# Patient Record
Sex: Female | Born: 1965 | Hispanic: No | Smoking: Current every day smoker
Health system: Southern US, Community
[De-identification: ages and names within clinical notes are randomized; demographics above are authoritative.]

## PROBLEM LIST (undated history)

## (undated) DIAGNOSIS — I639 Cerebral infarction, unspecified: Secondary | ICD-10-CM

## (undated) DIAGNOSIS — Z7901 Long term (current) use of anticoagulants: Secondary | ICD-10-CM

## (undated) DIAGNOSIS — M1990 Unspecified osteoarthritis, unspecified site: Secondary | ICD-10-CM

## (undated) DIAGNOSIS — I700 Atherosclerosis of aorta: Secondary | ICD-10-CM

## (undated) DIAGNOSIS — E785 Hyperlipidemia, unspecified: Secondary | ICD-10-CM

## (undated) DIAGNOSIS — J449 Chronic obstructive pulmonary disease, unspecified: Secondary | ICD-10-CM

## (undated) DIAGNOSIS — G4733 Obstructive sleep apnea (adult) (pediatric): Secondary | ICD-10-CM

## (undated) DIAGNOSIS — M51369 Other intervertebral disc degeneration, lumbar region without mention of lumbar back pain or lower extremity pain: Secondary | ICD-10-CM

## (undated) DIAGNOSIS — J45909 Unspecified asthma, uncomplicated: Secondary | ICD-10-CM

## (undated) DIAGNOSIS — E0590 Thyrotoxicosis, unspecified without thyrotoxic crisis or storm: Secondary | ICD-10-CM

## (undated) DIAGNOSIS — Z1379 Encounter for other screening for genetic and chromosomal anomalies: Secondary | ICD-10-CM

## (undated) DIAGNOSIS — M797 Fibromyalgia: Secondary | ICD-10-CM

## (undated) DIAGNOSIS — F4310 Post-traumatic stress disorder, unspecified: Secondary | ICD-10-CM

## (undated) DIAGNOSIS — M259 Joint disorder, unspecified: Secondary | ICD-10-CM

## (undated) DIAGNOSIS — Z923 Personal history of irradiation: Secondary | ICD-10-CM

## (undated) DIAGNOSIS — K219 Gastro-esophageal reflux disease without esophagitis: Secondary | ICD-10-CM

## (undated) DIAGNOSIS — F319 Bipolar disorder, unspecified: Secondary | ICD-10-CM

## (undated) DIAGNOSIS — F329 Major depressive disorder, single episode, unspecified: Secondary | ICD-10-CM

## (undated) DIAGNOSIS — G4700 Insomnia, unspecified: Secondary | ICD-10-CM

## (undated) DIAGNOSIS — N189 Chronic kidney disease, unspecified: Secondary | ICD-10-CM

## (undated) DIAGNOSIS — G2581 Restless legs syndrome: Secondary | ICD-10-CM

## (undated) DIAGNOSIS — N200 Calculus of kidney: Secondary | ICD-10-CM

## (undated) DIAGNOSIS — Z9221 Personal history of antineoplastic chemotherapy: Secondary | ICD-10-CM

## (undated) DIAGNOSIS — M5030 Other cervical disc degeneration, unspecified cervical region: Secondary | ICD-10-CM

## (undated) DIAGNOSIS — R0902 Hypoxemia: Secondary | ICD-10-CM

## (undated) DIAGNOSIS — F419 Anxiety disorder, unspecified: Secondary | ICD-10-CM

## (undated) DIAGNOSIS — Z8719 Personal history of other diseases of the digestive system: Secondary | ICD-10-CM

## (undated) DIAGNOSIS — G894 Chronic pain syndrome: Secondary | ICD-10-CM

## (undated) DIAGNOSIS — I10 Essential (primary) hypertension: Secondary | ICD-10-CM

## (undated) DIAGNOSIS — T7840XA Allergy, unspecified, initial encounter: Secondary | ICD-10-CM

## (undated) DIAGNOSIS — G43909 Migraine, unspecified, not intractable, without status migrainosus: Secondary | ICD-10-CM

## (undated) DIAGNOSIS — N183 Chronic kidney disease, stage 3 unspecified: Secondary | ICD-10-CM

## (undated) DIAGNOSIS — Z79891 Long term (current) use of opiate analgesic: Secondary | ICD-10-CM

## (undated) DIAGNOSIS — F32A Depression, unspecified: Secondary | ICD-10-CM

## (undated) DIAGNOSIS — I5030 Unspecified diastolic (congestive) heart failure: Secondary | ICD-10-CM

## (undated) DIAGNOSIS — J439 Emphysema, unspecified: Secondary | ICD-10-CM

## (undated) DIAGNOSIS — R87619 Unspecified abnormal cytological findings in specimens from cervix uteri: Secondary | ICD-10-CM

## (undated) DIAGNOSIS — M5136 Other intervertebral disc degeneration, lumbar region: Secondary | ICD-10-CM

## (undated) DIAGNOSIS — G4730 Sleep apnea, unspecified: Secondary | ICD-10-CM

## (undated) HISTORY — DX: Joint disorder, unspecified: M25.9

## (undated) HISTORY — PX: DILATION AND CURETTAGE OF UTERUS: SHX78

## (undated) HISTORY — PX: OOPHORECTOMY: SHX86

## (undated) HISTORY — DX: Fibromyalgia: M79.7

## (undated) HISTORY — DX: Depression, unspecified: F32.A

## (undated) HISTORY — DX: Unspecified osteoarthritis, unspecified site: M19.90

## (undated) HISTORY — DX: Gastro-esophageal reflux disease without esophagitis: K21.9

## (undated) HISTORY — DX: Unspecified abnormal cytological findings in specimens from cervix uteri: R87.619

## (undated) HISTORY — DX: Major depressive disorder, single episode, unspecified: F32.9

## (undated) HISTORY — DX: Allergy, unspecified, initial encounter: T78.40XA

## (undated) HISTORY — DX: Chronic obstructive pulmonary disease, unspecified: J44.9

## (undated) HISTORY — PX: NECK SURGERY: SHX720

## (undated) HISTORY — DX: Anxiety disorder, unspecified: F41.9

## (undated) HISTORY — PX: CARPAL TUNNEL RELEASE: SHX101

## (undated) HISTORY — DX: Migraine, unspecified, not intractable, without status migrainosus: G43.909

## (undated) HISTORY — PX: ENDOMETRIAL ABLATION: SHX621

## (undated) HISTORY — DX: Unspecified asthma, uncomplicated: J45.909

## (undated) HISTORY — PX: OTHER SURGICAL HISTORY: SHX169

## (undated) HISTORY — PX: LAPAROSCOPIC OOPHERECTOMY: SHX6507

## (undated) HISTORY — DX: Chronic kidney disease, unspecified: N18.9

## (undated) HISTORY — DX: Hypoxemia: R09.02

## (undated) HISTORY — DX: Encounter for other screening for genetic and chromosomal anomalies: Z13.79

---

## 2006-01-31 HISTORY — PX: ABDOMINAL HYSTERECTOMY: SHX81

## 2008-07-16 ENCOUNTER — Ambulatory Visit: Payer: Self-pay | Admitting: Internal Medicine

## 2008-08-12 ENCOUNTER — Ambulatory Visit: Payer: Self-pay | Admitting: Pain Medicine

## 2008-08-20 ENCOUNTER — Ambulatory Visit: Payer: Self-pay | Admitting: Pain Medicine

## 2008-09-01 ENCOUNTER — Ambulatory Visit: Payer: Self-pay | Admitting: Pain Medicine

## 2008-10-16 ENCOUNTER — Ambulatory Visit: Payer: Self-pay | Admitting: Pain Medicine

## 2008-10-29 ENCOUNTER — Ambulatory Visit: Payer: Self-pay | Admitting: Pain Medicine

## 2008-11-06 ENCOUNTER — Encounter: Payer: Self-pay | Admitting: Internal Medicine

## 2008-11-27 ENCOUNTER — Ambulatory Visit: Payer: Self-pay | Admitting: Pain Medicine

## 2008-12-01 ENCOUNTER — Encounter: Payer: Self-pay | Admitting: Internal Medicine

## 2008-12-03 ENCOUNTER — Ambulatory Visit: Payer: Self-pay | Admitting: Pain Medicine

## 2008-12-31 ENCOUNTER — Encounter: Payer: Self-pay | Admitting: Internal Medicine

## 2009-01-08 ENCOUNTER — Ambulatory Visit: Payer: Self-pay | Admitting: Pain Medicine

## 2009-03-18 ENCOUNTER — Ambulatory Visit: Payer: Self-pay | Admitting: Pain Medicine

## 2009-04-21 ENCOUNTER — Ambulatory Visit: Payer: Self-pay | Admitting: Rheumatology

## 2009-04-21 ENCOUNTER — Ambulatory Visit: Payer: Self-pay | Admitting: Pain Medicine

## 2009-05-04 ENCOUNTER — Ambulatory Visit: Payer: Self-pay | Admitting: Pain Medicine

## 2009-05-20 ENCOUNTER — Ambulatory Visit: Payer: Self-pay | Admitting: Pain Medicine

## 2009-06-10 ENCOUNTER — Ambulatory Visit: Payer: Self-pay | Admitting: Pain Medicine

## 2009-07-01 ENCOUNTER — Ambulatory Visit: Payer: Self-pay | Admitting: Pain Medicine

## 2009-08-13 ENCOUNTER — Ambulatory Visit: Payer: Self-pay | Admitting: Pain Medicine

## 2009-08-20 ENCOUNTER — Ambulatory Visit: Payer: Self-pay | Admitting: Gastroenterology

## 2009-08-24 ENCOUNTER — Ambulatory Visit: Payer: Self-pay | Admitting: Pain Medicine

## 2009-09-15 ENCOUNTER — Ambulatory Visit: Payer: Self-pay | Admitting: Pain Medicine

## 2009-09-29 ENCOUNTER — Ambulatory Visit: Payer: Self-pay | Admitting: Internal Medicine

## 2009-10-15 ENCOUNTER — Ambulatory Visit: Payer: Self-pay | Admitting: Pain Medicine

## 2009-10-28 ENCOUNTER — Ambulatory Visit: Payer: Self-pay | Admitting: Pain Medicine

## 2009-11-16 ENCOUNTER — Ambulatory Visit: Payer: Self-pay | Admitting: Pain Medicine

## 2009-11-23 ENCOUNTER — Ambulatory Visit: Payer: Self-pay | Admitting: Pain Medicine

## 2010-01-12 ENCOUNTER — Ambulatory Visit: Payer: Self-pay | Admitting: Pain Medicine

## 2010-01-27 ENCOUNTER — Ambulatory Visit: Payer: Self-pay | Admitting: Pain Medicine

## 2010-02-11 ENCOUNTER — Ambulatory Visit: Payer: Self-pay | Admitting: Pain Medicine

## 2010-03-09 ENCOUNTER — Ambulatory Visit: Payer: Self-pay | Admitting: Pain Medicine

## 2010-04-06 ENCOUNTER — Ambulatory Visit: Payer: Self-pay | Admitting: Pain Medicine

## 2010-04-28 ENCOUNTER — Ambulatory Visit: Payer: Self-pay | Admitting: Pain Medicine

## 2010-06-03 ENCOUNTER — Ambulatory Visit: Payer: Self-pay | Admitting: Pain Medicine

## 2010-06-16 ENCOUNTER — Ambulatory Visit: Payer: Self-pay | Admitting: Pain Medicine

## 2010-07-08 ENCOUNTER — Ambulatory Visit: Payer: Self-pay | Admitting: Pain Medicine

## 2010-08-02 ENCOUNTER — Ambulatory Visit: Payer: Self-pay | Admitting: Pain Medicine

## 2010-08-06 ENCOUNTER — Emergency Department: Payer: Self-pay | Admitting: Unknown Physician Specialty

## 2010-08-16 ENCOUNTER — Ambulatory Visit: Payer: Self-pay | Admitting: Pain Medicine

## 2010-09-08 ENCOUNTER — Ambulatory Visit: Payer: Self-pay | Admitting: Pain Medicine

## 2010-09-15 ENCOUNTER — Ambulatory Visit: Payer: Self-pay | Admitting: Pain Medicine

## 2010-10-05 ENCOUNTER — Ambulatory Visit: Payer: Self-pay | Admitting: Pain Medicine

## 2010-10-13 ENCOUNTER — Ambulatory Visit: Payer: Self-pay | Admitting: Pain Medicine

## 2010-10-14 ENCOUNTER — Ambulatory Visit: Payer: Self-pay | Admitting: Internal Medicine

## 2010-11-04 ENCOUNTER — Ambulatory Visit: Payer: Self-pay | Admitting: Pain Medicine

## 2010-11-08 ENCOUNTER — Ambulatory Visit: Payer: Self-pay | Admitting: Pain Medicine

## 2010-11-30 ENCOUNTER — Ambulatory Visit: Payer: Self-pay | Admitting: Pain Medicine

## 2010-12-06 ENCOUNTER — Ambulatory Visit: Payer: Self-pay | Admitting: Pain Medicine

## 2010-12-10 ENCOUNTER — Ambulatory Visit: Payer: Self-pay | Admitting: Pain Medicine

## 2010-12-28 ENCOUNTER — Ambulatory Visit: Payer: Self-pay | Admitting: Pain Medicine

## 2011-01-18 ENCOUNTER — Ambulatory Visit: Payer: Self-pay | Admitting: Pain Medicine

## 2011-02-23 ENCOUNTER — Ambulatory Visit: Payer: Self-pay | Admitting: Pain Medicine

## 2011-03-01 ENCOUNTER — Ambulatory Visit: Payer: Self-pay | Admitting: Pain Medicine

## 2011-03-21 ENCOUNTER — Ambulatory Visit: Payer: Self-pay | Admitting: Internal Medicine

## 2011-03-23 ENCOUNTER — Ambulatory Visit: Payer: Self-pay | Admitting: Pain Medicine

## 2011-03-25 ENCOUNTER — Ambulatory Visit: Payer: Self-pay | Admitting: Internal Medicine

## 2011-03-25 LAB — CREATININE, SERUM
Creatinine: 1.11 mg/dL (ref 0.60–1.30)
EGFR (African American): >60
EGFR (Non-African Amer.): 56 — ABNORMAL LOW

## 2011-03-30 ENCOUNTER — Ambulatory Visit: Payer: Self-pay | Admitting: Rheumatology

## 2011-05-03 ENCOUNTER — Ambulatory Visit: Payer: Self-pay | Admitting: Pain Medicine

## 2011-05-16 ENCOUNTER — Ambulatory Visit: Payer: Self-pay | Admitting: Pain Medicine

## 2011-06-02 ENCOUNTER — Ambulatory Visit: Payer: Self-pay | Admitting: Pain Medicine

## 2011-06-15 ENCOUNTER — Ambulatory Visit: Payer: Self-pay | Admitting: Pain Medicine

## 2011-06-30 ENCOUNTER — Ambulatory Visit: Payer: Self-pay | Admitting: Pain Medicine

## 2011-07-25 ENCOUNTER — Ambulatory Visit: Payer: Self-pay | Admitting: Pain Medicine

## 2011-08-15 ENCOUNTER — Ambulatory Visit: Payer: Self-pay | Admitting: Pain Medicine

## 2011-08-30 ENCOUNTER — Ambulatory Visit: Payer: Self-pay | Admitting: Pain Medicine

## 2011-09-27 ENCOUNTER — Ambulatory Visit: Payer: Self-pay | Admitting: Pain Medicine

## 2011-10-10 ENCOUNTER — Ambulatory Visit: Payer: Self-pay | Admitting: Pain Medicine

## 2011-10-17 ENCOUNTER — Ambulatory Visit: Payer: Self-pay | Admitting: Internal Medicine

## 2011-11-22 ENCOUNTER — Ambulatory Visit: Payer: Self-pay | Admitting: Pain Medicine

## 2011-12-14 ENCOUNTER — Ambulatory Visit: Payer: Self-pay | Admitting: Pain Medicine

## 2012-01-19 ENCOUNTER — Ambulatory Visit: Payer: Self-pay | Admitting: Pain Medicine

## 2012-02-15 ENCOUNTER — Ambulatory Visit: Payer: Self-pay | Admitting: Pain Medicine

## 2012-03-14 ENCOUNTER — Ambulatory Visit: Payer: Self-pay | Admitting: Unknown Physician Specialty

## 2012-03-14 LAB — CREATININE, SERUM
Creatinine: 1.01 mg/dL (ref 0.60–1.30)
EGFR (Non-African Amer.): >60

## 2012-03-22 ENCOUNTER — Ambulatory Visit: Payer: Self-pay | Admitting: Pain Medicine

## 2012-04-02 ENCOUNTER — Ambulatory Visit: Payer: Self-pay | Admitting: Pain Medicine

## 2012-04-09 ENCOUNTER — Ambulatory Visit: Payer: Self-pay | Admitting: Pain Medicine

## 2012-04-19 ENCOUNTER — Ambulatory Visit: Payer: Self-pay | Admitting: Pain Medicine

## 2012-05-22 ENCOUNTER — Ambulatory Visit: Payer: Self-pay | Admitting: Pain Medicine

## 2012-05-31 ENCOUNTER — Ambulatory Visit: Payer: Self-pay | Admitting: Pain Medicine

## 2012-06-04 ENCOUNTER — Ambulatory Visit: Payer: Self-pay | Admitting: Pain Medicine

## 2012-06-21 ENCOUNTER — Ambulatory Visit: Payer: Self-pay | Admitting: Pain Medicine

## 2012-07-04 ENCOUNTER — Ambulatory Visit: Payer: Self-pay | Admitting: Pain Medicine

## 2012-07-19 ENCOUNTER — Ambulatory Visit: Payer: Self-pay | Admitting: Pain Medicine

## 2012-08-12 ENCOUNTER — Emergency Department: Payer: Self-pay | Admitting: Emergency Medicine

## 2012-08-21 ENCOUNTER — Ambulatory Visit: Payer: Self-pay | Admitting: Pain Medicine

## 2012-09-05 ENCOUNTER — Ambulatory Visit: Payer: Self-pay | Admitting: Pain Medicine

## 2012-09-10 ENCOUNTER — Encounter: Payer: Self-pay | Admitting: Orthopedic Surgery

## 2012-09-18 ENCOUNTER — Ambulatory Visit: Payer: Self-pay | Admitting: Pain Medicine

## 2012-10-01 ENCOUNTER — Encounter: Payer: Self-pay | Admitting: Orthopedic Surgery

## 2012-10-03 ENCOUNTER — Ambulatory Visit: Payer: Self-pay | Admitting: Pain Medicine

## 2012-10-16 ENCOUNTER — Ambulatory Visit: Payer: Self-pay | Admitting: Pain Medicine

## 2012-10-17 ENCOUNTER — Ambulatory Visit: Payer: Self-pay | Admitting: Internal Medicine

## 2012-10-31 ENCOUNTER — Encounter: Payer: Self-pay | Admitting: Orthopedic Surgery

## 2012-11-05 ENCOUNTER — Ambulatory Visit: Payer: Self-pay | Admitting: Gastroenterology

## 2012-11-09 ENCOUNTER — Ambulatory Visit: Payer: Self-pay | Admitting: Internal Medicine

## 2012-11-15 ENCOUNTER — Ambulatory Visit: Payer: Self-pay | Admitting: Pain Medicine

## 2012-11-28 ENCOUNTER — Ambulatory Visit: Payer: Self-pay | Admitting: Pain Medicine

## 2012-12-01 ENCOUNTER — Encounter: Payer: Self-pay | Admitting: Orthopedic Surgery

## 2012-12-18 ENCOUNTER — Ambulatory Visit: Payer: Self-pay | Admitting: Pain Medicine

## 2013-01-02 ENCOUNTER — Ambulatory Visit: Payer: Self-pay | Admitting: Pain Medicine

## 2013-01-15 ENCOUNTER — Ambulatory Visit: Payer: Self-pay | Admitting: Pain Medicine

## 2013-02-14 ENCOUNTER — Ambulatory Visit: Payer: Self-pay | Admitting: Pain Medicine

## 2013-02-26 ENCOUNTER — Ambulatory Visit: Payer: Self-pay | Admitting: Pain Medicine

## 2013-02-27 ENCOUNTER — Ambulatory Visit: Payer: Self-pay | Admitting: Pain Medicine

## 2013-03-21 ENCOUNTER — Ambulatory Visit: Payer: Self-pay | Admitting: Pain Medicine

## 2013-04-18 ENCOUNTER — Ambulatory Visit: Payer: Self-pay | Admitting: Pain Medicine

## 2013-04-19 DIAGNOSIS — M47812 Spondylosis without myelopathy or radiculopathy, cervical region: Secondary | ICD-10-CM | POA: Insufficient documentation

## 2013-04-29 ENCOUNTER — Ambulatory Visit: Payer: Self-pay | Admitting: Pain Medicine

## 2013-05-16 ENCOUNTER — Ambulatory Visit: Payer: Self-pay | Admitting: Pain Medicine

## 2013-06-05 ENCOUNTER — Ambulatory Visit: Payer: Self-pay | Admitting: Pain Medicine

## 2013-06-13 ENCOUNTER — Ambulatory Visit: Payer: Self-pay | Admitting: Pain Medicine

## 2013-07-16 ENCOUNTER — Ambulatory Visit: Payer: Self-pay | Admitting: Pain Medicine

## 2013-07-24 ENCOUNTER — Ambulatory Visit: Payer: Self-pay | Admitting: Pain Medicine

## 2013-08-01 ENCOUNTER — Emergency Department: Payer: Self-pay | Admitting: Emergency Medicine

## 2013-08-22 ENCOUNTER — Ambulatory Visit: Payer: Self-pay | Admitting: Pain Medicine

## 2013-09-24 ENCOUNTER — Ambulatory Visit: Payer: Self-pay | Admitting: Pain Medicine

## 2013-09-24 DIAGNOSIS — M5010 Cervical disc disorder with radiculopathy, unspecified cervical region: Secondary | ICD-10-CM | POA: Insufficient documentation

## 2013-10-09 ENCOUNTER — Ambulatory Visit: Payer: Self-pay | Admitting: Pain Medicine

## 2013-10-22 ENCOUNTER — Ambulatory Visit: Payer: Self-pay | Admitting: Pain Medicine

## 2013-11-12 ENCOUNTER — Ambulatory Visit: Payer: Self-pay | Admitting: Pain Medicine

## 2013-11-12 ENCOUNTER — Ambulatory Visit: Payer: Self-pay | Admitting: Nurse Practitioner

## 2013-11-28 DIAGNOSIS — G894 Chronic pain syndrome: Secondary | ICD-10-CM | POA: Insufficient documentation

## 2013-12-10 ENCOUNTER — Ambulatory Visit: Payer: Self-pay | Admitting: Pain Medicine

## 2014-01-09 ENCOUNTER — Ambulatory Visit: Payer: Self-pay | Admitting: Pain Medicine

## 2014-02-05 ENCOUNTER — Ambulatory Visit: Payer: Self-pay | Admitting: Podiatry

## 2014-02-06 ENCOUNTER — Ambulatory Visit: Payer: Self-pay | Admitting: Pain Medicine

## 2014-02-17 ENCOUNTER — Ambulatory Visit (INDEPENDENT_AMBULATORY_CARE_PROVIDER_SITE_OTHER): Payer: Medicare Other | Admitting: Podiatry

## 2014-02-17 ENCOUNTER — Ambulatory Visit (INDEPENDENT_AMBULATORY_CARE_PROVIDER_SITE_OTHER): Payer: Medicare Other

## 2014-02-17 ENCOUNTER — Encounter: Payer: Self-pay | Admitting: Podiatry

## 2014-02-17 VITALS — BP 119/76 | HR 81 | Resp 16 | Ht 61.0 in | Wt 165.0 lb

## 2014-02-17 DIAGNOSIS — M797 Fibromyalgia: Secondary | ICD-10-CM | POA: Insufficient documentation

## 2014-02-17 DIAGNOSIS — G2581 Restless legs syndrome: Secondary | ICD-10-CM | POA: Insufficient documentation

## 2014-02-17 DIAGNOSIS — J449 Chronic obstructive pulmonary disease, unspecified: Secondary | ICD-10-CM | POA: Insufficient documentation

## 2014-02-17 DIAGNOSIS — R519 Headache, unspecified: Secondary | ICD-10-CM | POA: Insufficient documentation

## 2014-02-17 DIAGNOSIS — E785 Hyperlipidemia, unspecified: Secondary | ICD-10-CM | POA: Insufficient documentation

## 2014-02-17 DIAGNOSIS — N289 Disorder of kidney and ureter, unspecified: Secondary | ICD-10-CM | POA: Insufficient documentation

## 2014-02-17 DIAGNOSIS — G4730 Sleep apnea, unspecified: Secondary | ICD-10-CM | POA: Insufficient documentation

## 2014-02-17 DIAGNOSIS — M779 Enthesopathy, unspecified: Secondary | ICD-10-CM

## 2014-02-17 DIAGNOSIS — F319 Bipolar disorder, unspecified: Secondary | ICD-10-CM | POA: Insufficient documentation

## 2014-02-17 DIAGNOSIS — R51 Headache: Secondary | ICD-10-CM

## 2014-02-17 NOTE — Progress Notes (Signed)
   Subjective:    Patient ID: Drucilla Schmidt, female    DOB: 06/18/1965, 49 y.o.   MRN: [id number]  HPI Comments: Pain in the balls of my feet , i cant wear high heels nomore, also notice black stuff on my nails   Foot Pain      Review of Systems  All other systems reviewed and are negative.      Objective:   Physical Exam: I have reviewed her past medical history medications allergies surgery social history and review of systems. Pulses are strongly palpable bilateral. Neurologic sensorium is intact per Semmes-Weinstein monofilament. Deep tendon reflexes are intact bilateral and muscle strength was 5 over 5 dorsiflexion plantar flexors and inverters and everters all into the musculature is intact. Orthopedic evaluation demonstrates all joints distal to the ankle, full range of motion without crepitation. She has pain on end range of motion of the second metatarsophalangeal joints bilaterally. Radiographic evaluation does not demonstrate any type of osseus abnormalities in this area. Cutaneous evaluation of Mr. supple well-hydrated cutis no erythema edema cellulitis drainage or odor.        Assessment & Plan:  Assessment: Capsulitis second metatarsophalangeal joint bilateral.  Plan: Injected the second metatarsophalangeal joint today intra-articularly with dexamethasone and local anesthetic after Betadine skin prep. I will follow-up with her in a couple weeks discussed appropriate shoe gear strict exercises and ice therapy.

## 2014-02-27 DIAGNOSIS — G43119 Migraine with aura, intractable, without status migrainosus: Secondary | ICD-10-CM | POA: Insufficient documentation

## 2014-03-11 ENCOUNTER — Ambulatory Visit: Payer: Self-pay | Admitting: Pain Medicine

## 2014-03-19 ENCOUNTER — Ambulatory Visit (INDEPENDENT_AMBULATORY_CARE_PROVIDER_SITE_OTHER): Payer: Medicare Other | Admitting: Podiatry

## 2014-03-19 ENCOUNTER — Ambulatory Visit: Payer: Medicare Other | Admitting: Podiatry

## 2014-03-19 ENCOUNTER — Encounter: Payer: Self-pay | Admitting: Podiatry

## 2014-03-19 VITALS — BP 142/86 | HR 86 | Resp 12

## 2014-03-19 DIAGNOSIS — L608 Other nail disorders: Secondary | ICD-10-CM | POA: Diagnosis not present

## 2014-03-19 DIAGNOSIS — M779 Enthesopathy, unspecified: Secondary | ICD-10-CM

## 2014-03-19 DIAGNOSIS — L603 Nail dystrophy: Secondary | ICD-10-CM

## 2014-03-19 NOTE — Progress Notes (Signed)
She presents today for follow-up of capsulitis to the second metatarsophalangeal joints bilateral. She states that they are at least 50% improved.  Objective: Vital signs are stable she is alert and oriented 3 pulses are palpable bilateral. She has mild to moderate tenderness on in range of motion of the second metatarsophalangeal joints bilateral.  Assessment: Capsulitis second metatarsophalangeal joint bilateral.  Plan: I injected dexamethasone intra-articularly once again today after sterile Betadine skin prep to the second metatarsophalangeal joints bilateral. She tolerated the procedure well and I will follow-up with her in approximately 1 month.

## 2014-03-31 ENCOUNTER — Ambulatory Visit: Payer: Self-pay | Admitting: Pain Medicine

## 2014-04-02 ENCOUNTER — Telehealth: Payer: Self-pay | Admitting: *Deleted

## 2014-04-02 NOTE — Telephone Encounter (Signed)
Spoke to patient told her toenail culture came back negative for fungus

## 2014-04-04 ENCOUNTER — Ambulatory Visit: Payer: Self-pay | Admitting: Orthopedic Surgery

## 2014-04-08 ENCOUNTER — Encounter: Payer: Self-pay | Admitting: Podiatry

## 2014-04-29 ENCOUNTER — Ambulatory Visit: Admit: 2014-04-29 | Disposition: A | Discharge status: Home / Self Care | Payer: Self-pay | Admitting: Pain Medicine

## 2014-05-22 ENCOUNTER — Ambulatory Visit
Admit: 2014-05-22 | Disposition: A | Discharge status: Home / Self Care | Payer: Self-pay | Attending: Pain Medicine | Admitting: Pain Medicine

## 2014-06-02 ENCOUNTER — Encounter: Payer: Self-pay | Admitting: Pain Medicine

## 2014-06-24 ENCOUNTER — Encounter: Payer: Self-pay | Admitting: Pain Medicine

## 2014-07-21 ENCOUNTER — Ambulatory Visit: Payer: Medicare Other | Attending: Pain Medicine | Admitting: Pain Medicine

## 2014-07-21 ENCOUNTER — Encounter: Payer: Self-pay | Admitting: Pain Medicine

## 2014-07-21 VITALS — BP 104/73 | HR 80 | Temp 98.0°F | Resp 14 | Ht 61.0 in | Wt 160.0 lb

## 2014-07-21 DIAGNOSIS — M19019 Primary osteoarthritis, unspecified shoulder: Secondary | ICD-10-CM | POA: Diagnosis not present

## 2014-07-21 DIAGNOSIS — M2578 Osteophyte, vertebrae: Secondary | ICD-10-CM | POA: Diagnosis not present

## 2014-07-21 DIAGNOSIS — M47816 Spondylosis without myelopathy or radiculopathy, lumbar region: Secondary | ICD-10-CM

## 2014-07-21 DIAGNOSIS — M5136 Other intervertebral disc degeneration, lumbar region: Secondary | ICD-10-CM | POA: Insufficient documentation

## 2014-07-21 DIAGNOSIS — M542 Cervicalgia: Secondary | ICD-10-CM | POA: Diagnosis present

## 2014-07-21 DIAGNOSIS — M5022 Other cervical disc displacement, mid-cervical region: Secondary | ICD-10-CM | POA: Diagnosis not present

## 2014-07-21 DIAGNOSIS — M545 Low back pain: Secondary | ICD-10-CM | POA: Diagnosis present

## 2014-07-21 DIAGNOSIS — M5134 Other intervertebral disc degeneration, thoracic region: Secondary | ICD-10-CM

## 2014-07-21 DIAGNOSIS — Z981 Arthrodesis status: Secondary | ICD-10-CM

## 2014-07-21 DIAGNOSIS — M170 Bilateral primary osteoarthritis of knee: Secondary | ICD-10-CM | POA: Diagnosis not present

## 2014-07-21 DIAGNOSIS — M47814 Spondylosis without myelopathy or radiculopathy, thoracic region: Secondary | ICD-10-CM | POA: Diagnosis not present

## 2014-07-21 DIAGNOSIS — M4804 Spinal stenosis, thoracic region: Secondary | ICD-10-CM | POA: Diagnosis not present

## 2014-07-21 DIAGNOSIS — M5126 Other intervertebral disc displacement, lumbar region: Secondary | ICD-10-CM | POA: Diagnosis not present

## 2014-07-21 DIAGNOSIS — M47812 Spondylosis without myelopathy or radiculopathy, cervical region: Secondary | ICD-10-CM | POA: Diagnosis not present

## 2014-07-21 DIAGNOSIS — M5030 Other cervical disc degeneration, unspecified cervical region: Secondary | ICD-10-CM | POA: Diagnosis not present

## 2014-07-21 DIAGNOSIS — M19012 Primary osteoarthritis, left shoulder: Secondary | ICD-10-CM

## 2014-07-21 DIAGNOSIS — M5481 Occipital neuralgia: Secondary | ICD-10-CM

## 2014-07-21 DIAGNOSIS — M533 Sacrococcygeal disorders, not elsewhere classified: Secondary | ICD-10-CM | POA: Diagnosis not present

## 2014-07-21 DIAGNOSIS — M51369 Other intervertebral disc degeneration, lumbar region without mention of lumbar back pain or lower extremity pain: Secondary | ICD-10-CM

## 2014-07-21 MED ORDER — LIDOCAINE 5 % EX PTCH: MEDICATED_PATCH | CUTANEOUS | Status: DC

## 2014-07-21 MED ORDER — TRAMADOL HCL 50 MG PO TABS: ORAL_TABLET | ORAL | Status: DC

## 2014-07-21 MED ORDER — CHLORZOXAZONE 500 MG PO TABS: ORAL_TABLET | ORAL | Status: DC

## 2014-07-21 MED ORDER — HYDROCODONE-ACETAMINOPHEN 7.5-325 MG PO TABS: ORAL_TABLET | ORAL | Status: DC

## 2014-07-21 NOTE — Progress Notes (Signed)
Safety precautions to be maintained throughout the outpatient stay will include: orient to surroundings, keep bed in low position, maintain call bell within reach at all times, provide assistance with transfer out of bed and ambulation. Discharge ambulatory at 9:35 am

## 2014-07-21 NOTE — Patient Instructions (Addendum)
Continue present medications.  F/U PCP for evaliation of  BP and general medical  condition.  F/U surgical evaluation.  F/U neurological evaluation.  May consider radiofrequency rhizolysis or intraspinal procedures pending response to present treatment and F/U evaluation.  [facility] to [facility] should patient have concerns prior to scheduled return appointment.   A prescription for HYDROCODONE and ULTRAM was given to you today. A prescription for LIDODERM and PARAFON was sent to your pharmacy and should be available for pickup today.

## 2014-07-21 NOTE — Progress Notes (Signed)
   Subjective:    Patient ID: Briana [name], female    DOB: Jun 19, 1965, 49 y.o.   MRN: [id number]  HPI  Patient is 49 year old female who returns to [place] for follow-up evaluation and treatment of pain involving the neck upper extremity regions mediated and lower back upper and lower extremity regions. Patient is quite saddened on today's visit patient recently lost her fianc when they were vacationing. Discussed patient's condition will continue present medications and will consider for modification of medications pending follow-up evaluation. We'll also consider interventional treatment pending follow-up evaluation of patient. The patient was understanding and agree with suggested treatment plan    Review of Systems     Objective:   Physical Exam  There was mild tenderness of the splenius capitis and occipitalis musculature region and there was unremarkable Spurling's maneuver. Patient was with bilaterally equal grip strength. There was limited range of motion of the shoulders. Tinel and Phalen's maneuver were without increase of pain of significant degree. There was tends to palpation over the thoracic facet thoracic paraspinal musculature region without crepitus of the thoracic region noted. Palpation over the lumbar paraspinal musculature region lumbar facet region was a tends to palpation with tenderness over the PSIS and PII S region of mild to moderate degree. Was tenderness over the lumbar facet lumbar paraspinal musculature region of moderate degree. Palpation of the greater trochanteric territory region and iliotibial band regions were with moderate tends to palpation. There was negative clonus negative Homans. Straight leg raising was tolerates approximately 30 without increased with of pain with dorsiflexion noted. Knees were with tenderness to palpation with crepitus of the knees with negative anterior and posterior drawer signs without ballottement of the  patella. He was nontender and no costovertebral angle tenderness was noted.          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 Hydrocodone acetaminophen, tramadol, Parafon forte  F/U PCP for evaliation of  BP and general medical  Condition.  Follow up evaluation with Dr. Kasandra Knudsen as planned and as discussed  F/U surgical evaluation.  F/U neurological evaluation.  May consider radiofrequency rhizolysis or intraspinal procedures pending response to present treatment and F/U evaluation.  [facility] to call [facility] should patient have concerns prior to scheduled return appointment.

## 2014-08-19 ENCOUNTER — Encounter: Payer: Self-pay | Admitting: Pain Medicine

## 2014-08-19 ENCOUNTER — Ambulatory Visit: Payer: Medicare Other | Attending: Pain Medicine | Admitting: Pain Medicine

## 2014-08-19 VITALS — BP 106/93 | HR 99 | Temp 97.1°F | Resp 18 | Ht 61.0 in | Wt 175.0 lb

## 2014-08-19 DIAGNOSIS — Z981 Arthrodesis status: Secondary | ICD-10-CM

## 2014-08-19 DIAGNOSIS — M170 Bilateral primary osteoarthritis of knee: Secondary | ICD-10-CM | POA: Insufficient documentation

## 2014-08-19 DIAGNOSIS — M533 Sacrococcygeal disorders, not elsewhere classified: Secondary | ICD-10-CM | POA: Diagnosis not present

## 2014-08-19 DIAGNOSIS — M47816 Spondylosis without myelopathy or radiculopathy, lumbar region: Secondary | ICD-10-CM | POA: Insufficient documentation

## 2014-08-19 DIAGNOSIS — M19012 Primary osteoarthritis, left shoulder: Secondary | ICD-10-CM

## 2014-08-19 DIAGNOSIS — M5030 Other cervical disc degeneration, unspecified cervical region: Secondary | ICD-10-CM

## 2014-08-19 DIAGNOSIS — M79601 Pain in right arm: Secondary | ICD-10-CM | POA: Diagnosis present

## 2014-08-19 DIAGNOSIS — M5126 Other intervertebral disc displacement, lumbar region: Secondary | ICD-10-CM | POA: Diagnosis not present

## 2014-08-19 DIAGNOSIS — M5136 Other intervertebral disc degeneration, lumbar region: Secondary | ICD-10-CM | POA: Diagnosis not present

## 2014-08-19 DIAGNOSIS — M19019 Primary osteoarthritis, unspecified shoulder: Secondary | ICD-10-CM | POA: Insufficient documentation

## 2014-08-19 DIAGNOSIS — G588 Other specified mononeuropathies: Secondary | ICD-10-CM | POA: Diagnosis not present

## 2014-08-19 DIAGNOSIS — M19011 Primary osteoarthritis, right shoulder: Secondary | ICD-10-CM

## 2014-08-19 DIAGNOSIS — M542 Cervicalgia: Secondary | ICD-10-CM | POA: Diagnosis present

## 2014-08-19 DIAGNOSIS — M5134 Other intervertebral disc degeneration, thoracic region: Secondary | ICD-10-CM | POA: Insufficient documentation

## 2014-08-19 DIAGNOSIS — M5481 Occipital neuralgia: Secondary | ICD-10-CM

## 2014-08-19 DIAGNOSIS — M79602 Pain in left arm: Secondary | ICD-10-CM | POA: Diagnosis present

## 2014-08-19 DIAGNOSIS — G2581 Restless legs syndrome: Secondary | ICD-10-CM

## 2014-08-19 MED ORDER — TRAMADOL HCL 50 MG PO TABS: ORAL_TABLET | ORAL | Status: DC

## 2014-08-19 MED ORDER — CHLORZOXAZONE 500 MG PO TABS: ORAL_TABLET | ORAL | Status: DC

## 2014-08-19 MED ORDER — HYDROCODONE-ACETAMINOPHEN 7.5-325 MG PO TABS: ORAL_TABLET | ORAL | Status: DC

## 2014-08-19 NOTE — Patient Instructions (Addendum)
Continue present medications  Parafon Forte Ultram and hydrocodone acetaminophen and Lidoderm patches  F/U PCP Dr  Stephanie Coup for evaliation of  BP and general medical  condition.  F/U surgical evaluation of pain of the cervical and lumbar regions and follow-up with Dr. Mack Guise for evaluation of shoulder  F/U neurological evaluation  May consider radiofrequency rhizolysis or intraspinal procedures pending response to present treatment and F/U evaluation.  Patient to call [facility] should patient have concerns prior to scheduled return appointment.

## 2014-08-19 NOTE — Progress Notes (Signed)
Safety precautions to be maintained throughout the outpatient stay will include: orient to surroundings, keep bed in low position, maintain call bell within reach at all times, provide assistance with transfer out of bed and ambulation.  

## 2014-08-19 NOTE — Progress Notes (Signed)
   Subjective:    Patient ID: Briana [name], female    DOB: [birth year], 49 y.o.   MRN: [id number]  HPI   The patient is 49 year old female [facility] evaluation and treatment of pain involving neck upper extremity region. Lower back and lower extremity region. Patient with significant pain involving the right shoulder to scheduled to undergo surgical intervention of the right shoulder with pain interfering with all activities of daily living as well as ability to obtain restful sleep. Patient denies any trauma change in events of daily living the call significant change in symptomatology.         PHYSICAL EXAMINATION   There was tends to palpation of the region of the splenius capitis and occipitalis region palpation with reproduced pain of mild to moderate degree. There was tends to palpation over the thoracic facet thoracic paraspinal musculature region. No crepitus of the thoracic region was noted. There appeared to be unremarkable Spurling's maneuver. Of the acromial clavicular glenohumeral joint region especially on the right and patient had difficulty performing the drop test. Palpation over the region of the lumbar paraspinal muscles region lumbar facet region associated with tenderness to palpation of moderately severe degree. There was straight leg raising limited to approximately 20 without increased pain with dorsiflexion noted. There was negative clonus negative Homans. No definite sensory deficit of dermatomal distribution detected. Increased pain with pressure applied to the ilium with patient in lateral decubitus position. No definite sensory deficit of dermatomal distribution was detected. There was negative clonus negative Homans. Knees with tenderness to palpation with negative anterior and posterior drawer signs. There was no ballottement of the patella noted there was crepitus of the knee. There were negative anterior and posterior drawer  signs.    Review of Systems     Objective:   Physical Exam         Assessment & Plan:     Degenerative disc disease lumbar spine  Degenerative changes lumbar spine T12-L1, L1 to, L2-3, L3-4, L4-5, and L5-S1 degenerative changes with paracentral disc bulging, partially facet effacement of the anterior cerebrospinal fluid space   Lumbar facet syndrome   Sacroiliac joint dysfunction   Thoracic degenerative disc disease   Thoracic facet syndrome   Intercostal neuralgia   Bilateral occipital neuralgia   Degenerative joint disease of shoulder   Degenerative joint disease of knees    Plan  Continue present medications Parafon forte , Ultram and hydrocodone acetaminophen  F/U PCP Dr Stephanie Coup for evaliation of  BP and general medical  condition.  F/U surgical evaluation Dr Mack Guise  for surgery of the shoulder  F/U neurological evaluation  F/U  Psych evaluation  May consider radiofrequency rhizolysis or intraspinal procedures pending response to present treatment and F/U evaluation.  Patient to call [facility] should patient have concerns prior to scheduled return appointment.

## 2014-08-21 ENCOUNTER — Telehealth: Payer: Self-pay | Admitting: Pain Medicine

## 2014-08-21 NOTE — Telephone Encounter (Signed)
Briana [name] UDS was spilled out in the bag and was not able to be sent to the lab. The spec was trashed and the paperwork was shredded. Please not on pt chart she will need another UDS next visit.

## 2014-08-21 NOTE — Telephone Encounter (Signed)
Note placed on chart to repeat UDS

## 2014-08-25 ENCOUNTER — Encounter
Admission: RE | Admit: 2014-08-25 | Discharge: 2014-08-25 | Disposition: A | Discharge status: Home / Self Care | Payer: Medicare Other | Source: Ambulatory Visit | Attending: Orthopedic Surgery | Admitting: Orthopedic Surgery

## 2014-08-25 DIAGNOSIS — Z01810 Encounter for preprocedural cardiovascular examination: Secondary | ICD-10-CM | POA: Diagnosis not present

## 2014-08-25 LAB — CBC
HCT: 40.6 % (ref 35.0–47.0)
Hemoglobin: 13.5 g/dL (ref 12.0–16.0)
MCH: 32.1 pg (ref 26.0–34.0)
MCHC: 33.2 g/dL (ref 32.0–36.0)
MCV: 96.7 fL (ref 80.0–100.0)
PLATELETS: 263 10*3/uL (ref 150–440)
RBC: 4.2 MIL/uL (ref 3.80–5.20)
RDW: 13.8 % (ref 11.5–14.5)
WBC: 6.5 10*3/uL (ref 3.6–11.0)

## 2014-08-25 LAB — BASIC METABOLIC PANEL
Anion gap: 6 (ref 5–15)
BUN: 22 mg/dL — AB (ref 6–20)
CO2: 24 mmol/L (ref 22–32)
Calcium: 9.2 mg/dL (ref 8.9–10.3)
Chloride: 108 mmol/L (ref 101–111)
Creatinine, Ser: 1.08 mg/dL — ABNORMAL HIGH (ref 0.44–1.00)
GFR calc Af Amer: >60 mL/min (ref >60)
GFR, EST NON AFRICAN AMERICAN: 59 mL/min — AB (ref >60)
Glucose, Bld: 96 mg/dL (ref 65–99)
Potassium: 3.9 mmol/L (ref 3.5–5.1)
Sodium: 138 mmol/L (ref 135–145)

## 2014-08-25 NOTE — Patient Instructions (Signed)
  Your procedure is scheduled on: 09/03/14 Wed Report to Day Surgery. To find out your arrival time please call [phone] between 1PM - 3PM on 09/02/14 Tues.  Remember: Instructions that are not followed completely may result in serious medical risk, up to and including death, or upon the discretion of your surgeon and anesthesiologist your surgery may need to be rescheduled.    _x___ 1. Do not eat food or drink liquids after midnight. No gum chewing or hard candies.     ____ 2. No Alcohol for 24 hours before or after surgery.   ____ 3. Bring all medications with you on the day of surgery if instructed.    _x___ 4. Notify your doctor if there is any change in your medical condition     (cold, fever, infections).     Do not wear jewelry, make-up, hairpins, clips or nail polish.  Do not wear lotions, powders, or perfumes. You may wear deodorant.  Do not shave 48 hours prior to surgery. Men may shave face and neck.  Do not bring valuables to the hospital.    [facility] is not responsible for any belongings or valuables.               Contacts, dentures or bridgework may not be worn into surgery.  Leave your suitcase in the car. After surgery it may be brought to your room.  For patients admitted to the hospital, discharge time is determined by your                treatment team.   Patients discharged the day of surgery will not be allowed to drive home.   Please read over the following fact sheets that you were given:      __x__ Take these medicines the morning of surgery with A SIP OF WATER:    1. albuterol (ACCUNEB) 0.63 MG/3ML nebulizer solution  2. LORazepam (ATIVAN) 0.5 MG tablet  3. omeprazole (PRILOSEC) 20 MG capsule  4.tiotropium (SPIRIVA) 18 MCG inhalation capsule  5.  6.  ____ Fleet Enema (as directed)   _x___ Use CHG Soap as directed  ____ Use inhalers on the day of surgery  ____ Stop metformin 2 days prior to surgery    ____ Take 1/2 of usual insulin dose the  night before surgery and none on the morning of surgery.   ____ Stop Coumadin/Plavix/aspirin on   ____ Stop Anti-inflammatories on    _x___ Stop supplements until after surgery.   Stop Vitamin e 7 days before surgery  ____ Bring C-Pap to the hospital.

## 2014-09-03 ENCOUNTER — Ambulatory Visit: Payer: Medicare Other | Admitting: Anesthesiology

## 2014-09-03 ENCOUNTER — Ambulatory Visit
Admission: RE | Admit: 2014-09-03 | Discharge: 2014-09-03 | Disposition: A | Discharge status: Home / Self Care | Payer: Medicare Other | Source: Ambulatory Visit | Attending: Orthopedic Surgery | Admitting: Orthopedic Surgery

## 2014-09-03 ENCOUNTER — Encounter
Admission: RE | Disposition: A | Discharge status: Home / Self Care | Payer: Self-pay | Source: Ambulatory Visit | Attending: Orthopedic Surgery

## 2014-09-03 ENCOUNTER — Other Ambulatory Visit: Payer: Self-pay | Admitting: Pain Medicine

## 2014-09-03 ENCOUNTER — Encounter: Payer: Self-pay | Admitting: Anesthesiology

## 2014-09-03 DIAGNOSIS — Z809 Family history of malignant neoplasm, unspecified: Secondary | ICD-10-CM | POA: Diagnosis not present

## 2014-09-03 DIAGNOSIS — J45909 Unspecified asthma, uncomplicated: Secondary | ICD-10-CM | POA: Insufficient documentation

## 2014-09-03 DIAGNOSIS — N189 Chronic kidney disease, unspecified: Secondary | ICD-10-CM | POA: Diagnosis not present

## 2014-09-03 DIAGNOSIS — M47816 Spondylosis without myelopathy or radiculopathy, lumbar region: Secondary | ICD-10-CM

## 2014-09-03 DIAGNOSIS — F329 Major depressive disorder, single episode, unspecified: Secondary | ICD-10-CM | POA: Insufficient documentation

## 2014-09-03 DIAGNOSIS — Z90721 Acquired absence of ovaries, unilateral: Secondary | ICD-10-CM | POA: Insufficient documentation

## 2014-09-03 DIAGNOSIS — Z91040 Latex allergy status: Secondary | ICD-10-CM | POA: Insufficient documentation

## 2014-09-03 DIAGNOSIS — M4320 Fusion of spine, site unspecified: Secondary | ICD-10-CM | POA: Diagnosis not present

## 2014-09-03 DIAGNOSIS — Z881 Allergy status to other antibiotic agents status: Secondary | ICD-10-CM | POA: Diagnosis not present

## 2014-09-03 DIAGNOSIS — M25811 Other specified joint disorders, right shoulder: Secondary | ICD-10-CM | POA: Diagnosis not present

## 2014-09-03 DIAGNOSIS — F17210 Nicotine dependence, cigarettes, uncomplicated: Secondary | ICD-10-CM | POA: Insufficient documentation

## 2014-09-03 DIAGNOSIS — J449 Chronic obstructive pulmonary disease, unspecified: Secondary | ICD-10-CM | POA: Diagnosis not present

## 2014-09-03 DIAGNOSIS — M797 Fibromyalgia: Secondary | ICD-10-CM | POA: Insufficient documentation

## 2014-09-03 DIAGNOSIS — M5030 Other cervical disc degeneration, unspecified cervical region: Secondary | ICD-10-CM

## 2014-09-03 DIAGNOSIS — Z7951 Long term (current) use of inhaled steroids: Secondary | ICD-10-CM | POA: Insufficient documentation

## 2014-09-03 DIAGNOSIS — M533 Sacrococcygeal disorders, not elsewhere classified: Secondary | ICD-10-CM

## 2014-09-03 DIAGNOSIS — Z91048 Other nonmedicinal substance allergy status: Secondary | ICD-10-CM | POA: Diagnosis not present

## 2014-09-03 DIAGNOSIS — F419 Anxiety disorder, unspecified: Secondary | ICD-10-CM | POA: Diagnosis not present

## 2014-09-03 DIAGNOSIS — M75121 Complete rotator cuff tear or rupture of right shoulder, not specified as traumatic: Secondary | ICD-10-CM | POA: Insufficient documentation

## 2014-09-03 DIAGNOSIS — M19012 Primary osteoarthritis, left shoulder: Secondary | ICD-10-CM

## 2014-09-03 DIAGNOSIS — Z9889 Other specified postprocedural states: Secondary | ICD-10-CM | POA: Insufficient documentation

## 2014-09-03 DIAGNOSIS — M19011 Primary osteoarthritis, right shoulder: Secondary | ICD-10-CM

## 2014-09-03 DIAGNOSIS — M5136 Other intervertebral disc degeneration, lumbar region: Secondary | ICD-10-CM

## 2014-09-03 DIAGNOSIS — Z791 Long term (current) use of non-steroidal anti-inflammatories (NSAID): Secondary | ICD-10-CM | POA: Diagnosis not present

## 2014-09-03 DIAGNOSIS — K219 Gastro-esophageal reflux disease without esophagitis: Secondary | ICD-10-CM | POA: Insufficient documentation

## 2014-09-03 DIAGNOSIS — G43909 Migraine, unspecified, not intractable, without status migrainosus: Secondary | ICD-10-CM | POA: Insufficient documentation

## 2014-09-03 DIAGNOSIS — Z981 Arthrodesis status: Secondary | ICD-10-CM

## 2014-09-03 DIAGNOSIS — Z91030 Bee allergy status: Secondary | ICD-10-CM | POA: Insufficient documentation

## 2014-09-03 DIAGNOSIS — Z79899 Other long term (current) drug therapy: Secondary | ICD-10-CM | POA: Diagnosis not present

## 2014-09-03 DIAGNOSIS — M7521 Bicipital tendinitis, right shoulder: Secondary | ICD-10-CM | POA: Insufficient documentation

## 2014-09-03 DIAGNOSIS — M5134 Other intervertebral disc degeneration, thoracic region: Secondary | ICD-10-CM

## 2014-09-03 HISTORY — PX: SHOULDER ARTHROSCOPY WITH OPEN ROTATOR CUFF REPAIR AND DISTAL CLAVICLE ACROMINECTOMY: SHX5683

## 2014-09-03 LAB — URINALYSIS COMPLETE WITH MICROSCOPIC (ARMC ONLY)
BILIRUBIN URINE: NEGATIVE
Bacteria, UA: NONE SEEN
GLUCOSE, UA: NEGATIVE mg/dL
HGB URINE DIPSTICK: NEGATIVE
Ketones, ur: NEGATIVE mg/dL
LEUKOCYTES UA: NEGATIVE
Nitrite: NEGATIVE
PH: 5 (ref 5.0–8.0)
PROTEIN: NEGATIVE mg/dL
Specific Gravity, Urine: 1.024 (ref 1.005–1.030)

## 2014-09-03 LAB — PROTIME-INR
INR: 0.97
Prothrombin Time: 13.1 seconds (ref 11.4–15.0)

## 2014-09-03 LAB — APTT: aPTT: 27 seconds (ref 24–36)

## 2014-09-03 SURGERY — SHOULDER ARTHROSCOPY WITH OPEN ROTATOR CUFF REPAIR AND DISTAL CLAVICLE ACROMINECTOMY
Anesthesia: General | Site: Shoulder | Laterality: Right | Wound class: Clean

## 2014-09-03 MED ORDER — LIDOCAINE HCL (PF) 1 % IJ SOLN
INTRAMUSCULAR | Status: AC
  Filled 2014-09-03: qty 5

## 2014-09-03 MED ORDER — OXYCODONE HCL 5 MG PO TABS: 5.0000 mg | ORAL_TABLET | ORAL | Status: DC | PRN

## 2014-09-03 MED ORDER — CLINDAMYCIN PHOSPHATE 600 MG/50ML IV SOLN
600.0000 mg | Freq: Once | INTRAVENOUS | Status: AC
  Administered 2014-09-03: 600 mg via INTRAVENOUS

## 2014-09-03 MED ORDER — CEFAZOLIN SODIUM-DEXTROSE 2-3 GM-% IV SOLR
INTRAVENOUS | Status: DC
Start: 2014-09-03 — End: 2014-09-03
  Filled 2014-09-03: qty 50

## 2014-09-03 MED ORDER — PHENYLEPHRINE HCL 10 MG/ML IJ SOLN
INTRAMUSCULAR | Status: DC | PRN
Start: 2014-09-03 — End: 2014-09-03
  Administered 2014-09-03: 200 ug via INTRAVENOUS
  Administered 2014-09-03 (×2): 100 ug via INTRAVENOUS

## 2014-09-03 MED ORDER — FENTANYL CITRATE (PF) 100 MCG/2ML IJ SOLN: 25.0000 ug | INTRAMUSCULAR | Status: DC | PRN

## 2014-09-03 MED ORDER — BUPIVACAINE HCL 0.25 % IJ SOLN
INTRAMUSCULAR | Status: DC | PRN
  Administered 2014-09-03: 30 mL

## 2014-09-03 MED ORDER — PROPOFOL 10 MG/ML IV BOLUS
INTRAVENOUS | Status: DC | PRN
  Administered 2014-09-03: 150 mg via INTRAVENOUS

## 2014-09-03 MED ORDER — NEOMYCIN-POLYMYXIN B GU 40-200000 IR SOLN
Status: AC
  Filled 2014-09-03: qty 2

## 2014-09-03 MED ORDER — LIDOCAINE HCL (PF) 1 % IJ SOLN
INTRAMUSCULAR | Status: AC
  Filled 2014-09-03: qty 30

## 2014-09-03 MED ORDER — MIDAZOLAM HCL 2 MG/2ML IJ SOLN
INTRAMUSCULAR | Status: DC | PRN
  Administered 2014-09-03: 2 mg via INTRAVENOUS

## 2014-09-03 MED ORDER — MIDAZOLAM HCL 5 MG/5ML IJ SOLN
INTRAMUSCULAR | Status: AC
  Administered 2014-09-03: 2 mg via INTRAVENOUS
  Filled 2014-09-03: qty 5

## 2014-09-03 MED ORDER — OXYCODONE HCL 5 MG PO TABS
5.0000 mg | ORAL_TABLET | Freq: Once | ORAL | Status: AC | PRN
  Administered 2014-09-03: 5 mg via ORAL

## 2014-09-03 MED ORDER — LACTATED RINGERS IV SOLN
INTRAVENOUS | Status: DC
  Administered 2014-09-03 (×3): via INTRAVENOUS

## 2014-09-03 MED ORDER — ROCURONIUM BROMIDE 100 MG/10ML IV SOLN
INTRAVENOUS | Status: DC | PRN
  Administered 2014-09-03: 50 mg via INTRAVENOUS

## 2014-09-03 MED ORDER — ACETAMINOPHEN 325 MG PO TABS: 650.0000 mg | ORAL_TABLET | Freq: Four times a day (QID) | ORAL | Status: DC | PRN

## 2014-09-03 MED ORDER — CHLORHEXIDINE GLUCONATE 4 % EX LIQD: 60.0000 mL | Freq: Once | CUTANEOUS | Status: DC

## 2014-09-03 MED ORDER — DEXTROSE 5 % IV SOLN
20.0000 mg | INTRAVENOUS | Status: DC | PRN
  Administered 2014-09-03: 10 ug/min via INTRAVENOUS

## 2014-09-03 MED ORDER — ROPIVACAINE HCL 5 MG/ML IJ SOLN
INTRAMUSCULAR | Status: AC
  Filled 2014-09-03: qty 20

## 2014-09-03 MED ORDER — OXYCODONE HCL 5 MG PO TABS
ORAL_TABLET | ORAL | Status: AC
  Filled 2014-09-03: qty 1

## 2014-09-03 MED ORDER — FENTANYL CITRATE (PF) 100 MCG/2ML IJ SOLN
INTRAMUSCULAR | Status: DC | PRN
  Administered 2014-09-03: 100 ug via INTRAVENOUS

## 2014-09-03 MED ORDER — ONDANSETRON HCL 4 MG/2ML IJ SOLN
INTRAMUSCULAR | Status: DC | PRN
  Administered 2014-09-03: 4 mg via INTRAVENOUS

## 2014-09-03 MED ORDER — BUPIVACAINE HCL (PF) 0.5 % IJ SOLN
INTRAMUSCULAR | Status: DC | PRN
Start: 2014-09-03 — End: 2014-09-03
  Administered 2014-09-03: 5 mL
  Administered 2014-09-03: 10 mL
  Administered 2014-09-03: 5 mL

## 2014-09-03 MED ORDER — EPINEPHRINE HCL 1 MG/ML IJ SOLN
INTRAMUSCULAR | Status: DC | PRN
  Administered 2014-09-03: 1 mg

## 2014-09-03 MED ORDER — LIDOCAINE HCL (CARDIAC) 20 MG/ML IV SOLN
INTRAVENOUS | Status: DC | PRN
Start: 2014-09-03 — End: 2014-09-03
  Administered 2014-09-03: 50 mg via INTRAVENOUS

## 2014-09-03 MED ORDER — EPINEPHRINE HCL 1 MG/ML IJ SOLN
INTRAMUSCULAR | Status: AC
  Filled 2014-09-03: qty 1

## 2014-09-03 MED ORDER — ACETAMINOPHEN 650 MG RE SUPP: 650.0000 mg | Freq: Four times a day (QID) | RECTAL | Status: DC | PRN

## 2014-09-03 MED ORDER — OXYCODONE HCL 5 MG/5ML PO SOLN: 5.0000 mg | Freq: Once | ORAL | Status: AC | PRN

## 2014-09-03 MED ORDER — MIDAZOLAM HCL 5 MG/5ML IJ SOLN
2.0000 mg | Freq: Once | INTRAMUSCULAR | Status: AC
  Administered 2014-09-03: 2 mg via INTRAVENOUS

## 2014-09-03 MED ORDER — DEXAMETHASONE SODIUM PHOSPHATE 4 MG/ML IJ SOLN
INTRAMUSCULAR | Status: DC | PRN
  Administered 2014-09-03: 4 mg via INTRAVENOUS
  Administered 2014-09-03: 6 mg via INTRAVENOUS

## 2014-09-03 MED ORDER — BUPIVACAINE HCL (PF) 0.25 % IJ SOLN
INTRAMUSCULAR | Status: AC
  Filled 2014-09-03: qty 30

## 2014-09-03 MED ORDER — POTASSIUM CHLORIDE IN NACL 20-0.45 MEQ/L-% IV SOLN: INTRAVENOUS | Status: DC

## 2014-09-03 MED ORDER — LIDOCAINE HCL (PF) 1 % IJ SOLN
INTRAMUSCULAR | Status: DC | PRN
  Administered 2014-09-03: 15 mL

## 2014-09-03 MED ORDER — CLINDAMYCIN PHOSPHATE 600 MG/50ML IV SOLN
INTRAVENOUS | Status: AC
  Filled 2014-09-03: qty 50

## 2014-09-03 MED ORDER — BISACODYL 10 MG RE SUPP: 10.0000 mg | Freq: Every day | RECTAL | Status: DC | PRN

## 2014-09-03 SURGICAL SUPPLY — 67 items
ADAPTER IRRIG TUBE 2 SPIKE SOL (ADAPTER) ×4 IMPLANT
ADPR TBG 2 SPK PMP STRL ASCP (ADAPTER) ×2
ANCH SUT 2 BSUT TK 12.4 PEEK (SUTURE)
ANCHOR DBLROW ROT CUFF 2.8 MAG (Anchor) ×5 IMPLANT
ANCHOR SUT PEEK 3.0 ST 3 (SUTURE) ×1 IMPLANT
BUR RADIUS 4.0X18.5 (BURR) ×1 IMPLANT
BUR RADIUS 5.5 (BURR) ×1 IMPLANT
CANNULA 5.75X7 CRYSTAL CLEAR (CANNULA) IMPLANT
CANNULA PARTIAL THREAD 2X7 (CANNULA) IMPLANT
CANNULA TWIST IN 8.25X9CM (CANNULA) ×2 IMPLANT
CONNECTOR M SMARTSTITCH (Connector) ×3 IMPLANT
COOLER POLAR GLACIER W/PUMP (MISCELLANEOUS) ×2 IMPLANT
DEVICE SUCT BLK HOLE OR FLOOR (MISCELLANEOUS) ×1 IMPLANT
DRAPE IMP U-DRAPE 54X76 (DRAPES) ×4 IMPLANT
DRAPE INCISE IOBAN 66X45 STRL (DRAPES) ×2 IMPLANT
DRAPE U-SHAPE 47X51 STRL (DRAPES) ×2 IMPLANT
DURAPREP 26ML APPLICATOR (WOUND CARE) ×4 IMPLANT
GAUZE PETRO XEROFOAM 1X8 (MISCELLANEOUS) ×2 IMPLANT
GAUZE SPONGE 4X4 12PLY STRL (GAUZE/BANDAGES/DRESSINGS) ×2 IMPLANT
GLOVE BIOGEL PI IND STRL 9 (GLOVE) ×1 IMPLANT
GLOVE BIOGEL PI INDICATOR 9 (GLOVE) ×1
GLOVE SURG 9.0 ORTHO LTXF (GLOVE) ×6 IMPLANT
GOWN STRL REUS TWL 2XL XL LVL4 (GOWN DISPOSABLE) ×2 IMPLANT
GOWN STRL REUS W/ TWL LRG LVL3 (GOWN DISPOSABLE) ×1 IMPLANT
GOWN STRL REUS W/ TWL LRG LVL4 (GOWN DISPOSABLE) ×1 IMPLANT
GOWN STRL REUS W/TWL LRG LVL3 (GOWN DISPOSABLE) ×2
GOWN STRL REUS W/TWL LRG LVL4 (GOWN DISPOSABLE) ×2
IV LACTATED RINGER IRRG 3000ML (IV SOLUTION) ×22
IV LR IRRIG 3000ML ARTHROMATIC (IV SOLUTION) ×8 IMPLANT
KIT RM TURNOVER STRD PROC AR (KITS) ×2 IMPLANT
KIT STABILIZATION SHOULDER (MISCELLANEOUS) ×2 IMPLANT
MANIFOLD NEPTUNE II (INSTRUMENTS) ×2 IMPLANT
MASK FACE SPIDER DISP (MASK) ×2 IMPLANT
MAT BLUE FLOOR 46X72 FLO (MISCELLANEOUS) ×6 IMPLANT
NDL SAFETY 18GX1.5 (NEEDLE) ×2 IMPLANT
NDL SAFETY 22GX1.5 (NEEDLE) ×2 IMPLANT
NS IRRIG 500ML POUR BTL (IV SOLUTION) ×1 IMPLANT
PACK ARTHROSCOPY SHOULDER (MISCELLANEOUS) ×2 IMPLANT
PAD GROUND ADULT SPLIT (MISCELLANEOUS) ×2 IMPLANT
PAD WRAPON POLAR SHDR UNIV (MISCELLANEOUS) ×1 IMPLANT
PASSER SUT CAPTURE FIRST (SUTURE) ×2 IMPLANT
SET TUBE SUCT SHAVER OUTFL 24K (TUBING) ×2 IMPLANT
SET TUBE TIP INTRA-ARTICULAR (MISCELLANEOUS) ×2 IMPLANT
SLING ULTRA II LG (MISCELLANEOUS) ×2 IMPLANT
SLING ULTRA II M (MISCELLANEOUS) ×2 IMPLANT
SMARTSTITCH M-CONNECTOR ×1 IMPLANT
STRIP CLOSURE SKIN 1/2X4 (GAUZE/BANDAGES/DRESSINGS) ×2 IMPLANT
SUT CO BRAID (SUTURE) ×7 IMPLANT
SUT ETHILON 4-0 (SUTURE)
SUT ETHILON 4-0 FS2 18XMFL BLK (SUTURE)
SUT KNTLS 2.8 MAGNUM (Anchor) ×7 IMPLANT
SUT LASSO 90 DEG SD STR (SUTURE) ×1 IMPLANT
SUT MNCRL 4-0 (SUTURE)
SUT MNCRL 4-0 27XMFL (SUTURE)
SUT PDS AB 0 CT1 27 (SUTURE) ×4 IMPLANT
SUT VIC AB 0 CT1 36 (SUTURE) ×5 IMPLANT
SUT VIC AB 2-0 CT2 27 (SUTURE) ×4 IMPLANT
SUTURE ETHLN 4-0 FS2 18XMF BLK (SUTURE) IMPLANT
SUTURE MAGNUM WIRE 2X48 BLK (SUTURE) IMPLANT
SUTURE MNCRL 4-0 27XMF (SUTURE) ×1 IMPLANT
SUTURE OPUS MAGNUM SZ 2 WHT (SUTURE) ×5 IMPLANT
SYRINGE 10CC LL (SYRINGE) ×2 IMPLANT
TAPE MICROFOAM 4IN (TAPE) ×2 IMPLANT
TUBING ARTHRO INFLOW-ONLY STRL (TUBING) ×2 IMPLANT
TUBING CONNECTING 10 (TUBING) ×2 IMPLANT
WAND HAND CNTRL MULTIVAC 90 (MISCELLANEOUS) ×2 IMPLANT
WRAPON POLAR PAD SHDR UNIV (MISCELLANEOUS) ×2

## 2014-09-03 NOTE — Anesthesia Procedure Notes (Addendum)
Anesthesia Regional Block:  Interscalene brachial plexus block  Pre-Anesthetic Checklist: ,, timeout performed, Correct Patient, Correct Site, Correct Laterality, Correct Procedure, Correct Position, site marked, Risks and benefits discussed,  Surgical consent,  Pre-op evaluation,  At surgeon's request and post-op pain management  Laterality: Right and Upper  Prep: chloraprep       Needles:  Injection technique: Single-shot  Needle Type: Stimiplex     Needle Length: 5cm 5 cm Needle Gauge: 22 and 22 G    Additional Needles:  Procedures: ultrasound guided (picture in chart) Interscalene brachial plexus block Narrative:  Start time: 09/03/2014 12:50 PM End time: 09/03/2014 1:00 PM Injection made incrementally with aspirations every 5 mL.  Performed by: Personally  Anesthesiologist: Katy Fitch K  Additional Notes: Functioning IV was confirmed and monitors were applied.  A 80mm 22ga Stimuplex needle was used. Sterile prep and drape,hand hygiene and sterile gloves were used.  Negative aspiration and negative test dose prior to incremental administration of local anesthetic. The patient tolerated the procedure well with no immediate complications.      Procedure Name: Intubation Performed by: Sinda Du Pre-anesthesia Checklist: Patient identified, Patient being monitored, Timeout performed, Emergency Drugs available and Suction available Patient Re-evaluated:Patient Re-evaluated prior to inductionOxygen Delivery Method: Circle system utilized Preoxygenation: Pre-oxygenation with 100% oxygen Intubation Type: IV induction Ventilation: Mask ventilation without difficulty Laryngoscope Size: Mac and 3 Grade View: Grade I Tube type: Oral Tube size: 7.0 mm Number of attempts: 1 Airway Equipment and Method: Stylet Placement Confirmation: ETT inserted through vocal cords under direct vision,  positive ETCO2 and breath sounds checked- equal and bilateral Secured at: 21 cm Tube  secured with: Tape Dental Injury: Teeth and Oropharynx as per pre-operative assessment

## 2014-09-03 NOTE — Discharge Instructions (Addendum)
Patient must wear sling at all times, including sleep.  Patient may be most comfortable sleeping in a recliner.  Do not try and lift your arm away from your body, continue using the sling for a total of 4 weeks following surgery.  Keep the dressing dry.  May remove bandage in 3 days and place Band-Aids. Leave the Steri-Strips in place.  May shower once dressing is removed in 3 days.  May remove sling carefully only for showers, leaving arm down by the side while in the shower.  Make sure to take some pain medication this evening before you fall asleep, in preparation for the nerve block wearing off in the middle of the night.  If the the pain medication causes itching, or is too strong, try taking a single tablet at a time, or combining with Benadryl.AMBULATORY SURGERY  DISCHARGE INSTRUCTIONS   1) The drugs that you were given will stay in your system until tomorrow so for the next 24 hours you should not:  A) Drive an automobile B) Make any legal decisions C) Drink any alcoholic beverage   2) You may resume regular meals tomorrow.  Today it is better to start with liquids and gradually work up to solid foods.  You may eat anything you prefer, but it is better to start with liquids, then soup and crackers, and gradually work up to solid foods.   3) Please notify your doctor immediately if you have any unusual bleeding, trouble breathing, redness and pain at the surgery site, drainage, fever, or pain not relieved by medication.    4) Additional Instructions:        Please contact your physician with any problems or Same Day Surgery at [phone], Monday through Friday 6 am to 4 pm, or [place] at [facility] number at [phone].AMBULATORY SURGERY  DISCHARGE INSTRUCTIONS   5) The drugs that you were given will stay in your system until tomorrow so for the next 24 hours you should not:  D) Drive an automobile E) Make any legal decisions F) Drink any alcoholic  beverage   6) You may resume regular meals tomorrow.  Today it is better to start with liquids and gradually work up to solid foods.  You may eat anything you prefer, but it is better to start with liquids, then soup and crackers, and gradually work up to solid foods.   7) Please notify your doctor immediately if you have any unusual bleeding, trouble breathing, redness and pain at the surgery site, drainage, fever, or pain not relieved by medication.    8) Additional Instructions:        Please contact your physician with any problems or Same Day Surgery at [phone], Monday through Friday 6 am to 4 pm, or [place] at [facility] number at [phone].

## 2014-09-03 NOTE — Anesthesia Preprocedure Evaluation (Signed)
Anesthesia Evaluation  Patient identified by MRN, date of birth, ID band Patient awake    Reviewed: Allergy & Precautions, H&P , NPO status , Patient's Chart, lab work & pertinent test results, reviewed documented beta blocker date and time   Airway Mallampati: II  TM Distance: >3 FB Neck ROM: full    Dental  (+) Poor Dentition, Chipped   Pulmonary asthma , sleep apnea , COPDCurrent Smoker,  breath sounds clear to auscultation  Pulmonary exam normal       Cardiovascular negative cardio ROS Normal cardiovascular examRhythm:regular Rate:Normal     Neuro/Psych  Headaches, PSYCHIATRIC DISORDERS  Neuromuscular disease    GI/Hepatic Neg liver ROS, GERD-  Controlled,  Endo/Other  negative endocrine ROS  Renal/GU Renal disease  negative genitourinary   Musculoskeletal   Abdominal   Peds  Hematology negative hematology ROS (+)   Anesthesia Other Findings Past Medical History:   GERD (gastroesophageal reflux disease)                       Fibromyalgia                                                 Joint disease                                                Allergy                                                      Asthma                                                       Oxygen deficiency                                            COPD (chronic obstructive pulmonary disease)                 Depression                                                   Chronic kidney disease                                       Anxiety  Arthritis                                                    Migraine                                                     Migraine                                                     Reproductive/Obstetrics negative OB ROS                             Anesthesia Physical Anesthesia Plan  ASA: III  Anesthesia Plan:  General ETT   Post-op Pain Management: MAC Combined w/ Regional for Post-op pain   Induction:   Airway Management Planned:   Additional Equipment:   Intra-op Plan:   Post-operative Plan:   Informed Consent: I have reviewed the patients History and Physical, chart, labs and discussed the procedure including the risks, benefits and alternatives for the proposed anesthesia with the patient or authorized representative who has indicated his/her understanding and acceptance.   Dental Advisory Given  Plan Discussed with: Anesthesiologist, CRNA and Surgeon  Anesthesia Plan Comments:         Anesthesia Quick Evaluation

## 2014-09-03 NOTE — Anesthesia Postprocedure Evaluation (Signed)
  Anesthesia Post-op Note  Patient: Briana [name]  Procedure(s) Performed: Procedure(s) with comments: RIGHT SHOULDER ARTHROSCOPY WITH MINI OPEN ROTATOR CUFF TEAR (Right) - biceps tenodesis, arthroscopic subacromial decompression and distal clavicle incision  Anesthesia type:General ETT  Patient location: PACU  Post pain: Pain level controlled  Post assessment: Post-op Vital signs reviewed, Patient's Cardiovascular Status Stable, Respiratory Function Stable, Patent Airway and No signs of Nausea or vomiting  Post vital signs: Reviewed and stable  Last Vitals:  Filed Vitals:   09/03/14 1627  BP: 108/70  Pulse: 88  Temp: 36.3 C  Resp: 27    Level of consciousness: awake, alert  and patient cooperative  Complications: No apparent anesthesia complications

## 2014-09-03 NOTE — Op Note (Addendum)
09/03/2014  4:36 PM  PATIENT:  Briana [name]  49 y.o. female  PRE-OPERATIVE DIAGNOSIS:  RIGHT SHOULDER SMALL FULL THICKNESS VS HIGH GRADE PARTIAL THICKNESS TEAR INVOLVING THE SUPRASPINATUS, SHOULDER IMPINGEMENT, AC JOINT ARTHROSIS, POSSIBLE BICEPS TENDON TEAR  POST-OPERATIVE DIAGNOSIS:  RIGHT SHOULDER SMALL FULL THICKNESS TEAR INVOLVING THE SUPRASPINATUS, ADVANCED BICEPS TENDINOSIS, SHOULDER IMPINGEMENT AND AC JOINT ARTHROSIS  PROCEDURE:  Procedure(s) with comments: RIGHT SHOULDER ARTHROSCOPY WITH MINI OPEN ROTATOR CUFF TEAR (Right) - biceps tenodesis, arthroscopic subacromial decompression and distal clavicle incision  SURGEON:  Surgeon(s) and Role:    * Thornton Park, MD - Primary  ANESTHESIA:   Gen. endotracheal intubation with right interscalene block and local anesthesia with 1% lidocaine plain for the incisions and subacromial 0.25% Marcaine plain at the conclusion the case.  PREOPERATIVE INDICATIONS:  Briana [name] is a  49 y.o. female with a diagnosis of RIGHT SHOULDER SMALL FULL THICKNESS AND HIGH GRADE PARTIAL THICKNESS TEAR INVOLVING THE SUPRASPINATUS who failed conservative measures and elected for surgical management.    The risks benefits and alternatives were discussed with the patient preoperatively including but not limited to the risks of infection, bleeding, nerve injury, persistent pain or weakness, failure of the hardware, re-tear of the rotator cuff and the need for further surgery. Medical risks include DVT and pulmonary embolism, myocardial infarction, stroke, pneumonia, respiratory failure and death. Patient understood these risks and wished to proceed.  OPERATIVE IMPLANTS: ArthroCare Magnum 2 anchors 3, Magnum M anchor 1  OPERATIVE FINDINGS: High-grade partial-thickness tear involving the supraspinatus, advanced biceps tendinosis, subacromial impingement and before meals joint arthrosis  OPERATIVE PROCEDURE: The patient was met in the preoperative area. The operative  shoulder was signed with the word yes and my initials according the hospital's correct site of surgery protocol. The patient underwent placement of a right interscalene block by the anesthesia service.  Patient was brought to the operating room where he underwent general endotracheal intubation. he wasplaced in a beachchair position. A spider arm positioner was used for this case. Examination under anesthesia revealed no limitation of motion or instability with load shift testing. The patient had a negative sulcus sign.  Patient was prepped and draped in a sterile fashion. A timeout was performed to verify the patient's name, date of birth, medical record number, correct site of surgery and correct procedure to be performed there was also used to verify the patient received antibiotics that all appropriate instruments, implants and radiographs studies were available in the room. Once all in attendance were in agreement case began.  Bony landmarks were drawn out with a surgical marker along with proposed arthroscopy incisions. These were pre-injected with 1% lidocaine plain. An 11 blade was used to establish a posterior portal through which the arthroscope was placed in the glenohumeral joint. A full diagnostic examination of the shoulder was performed. the anterior portal was established under direct visualization with an 18-gauge spinal needle. A 5.75 the medial arthroscopic cannula was placed through this anterior portal.   The intra-articular portion of the biceps tendon was found to have extensive tendinosis and thickening.  Therefore the decision was made to perform a tenodesis. An Community education officer stitch was placed in the biceps tendon and a tenotomy was performed using an arthroscopic scissor through the anterior cannula. The suture was then brought out through the anterior portal for later tenodesis. A 0 PDS suture was used to mark the location of the supraspinatus tear so could be identified from  the bursal side.  The  arthroscope was then placed in the subacromial space. A lateral portal was then established using an 18-gauge spinal needle for localization. ArthroCare Smart stitches were placed along the lateral border of the torn rotator cuff under direct visualization. A 4.0 resector shaver blade was then used to debride the supraspinatus from the bursal side at the location of the 0 PDS suture. Once the high-grade partial-thickness tear was completed to [employer] stitches were placed in the lateral border of the rotator cuff tear. The greater tuberosity was debrided using a 5.5 mm resector shaver blade to remove all remaining foreign fibers of the rotator cuff.  Debridement was performed until punctate bleeding was seen at the greater tuberosity footprint which will allow for rotator cuff healing..   The arthroscope was then placed in the subacromial space. Extensive bursitis was encountered and debrided using a 4-0 resector shaver blade and a 90 ArthroCare wand from the lateral portal. A subacromial decompression was also performed using a 5.5 mm resector shaver blade from the lateral portal. the 5.5 mm resector shaver blade was then placed through the anterior portal and distal clavicle excision was performed. All arthroscopic instruments were then removed and the mini-open portion of the procedure began.   A saber-type incision was made along the lateral border of the acromion. The deltoid muscle was identified and split in line with its fibers which allowed visualization of the rotator cuff. The biceps tendon and its associated tagging suture were brought out through the deltoid split. The Smart stitches previously placed in the lateral border of the rotator cuff were also brought out through the deltoid split. A tonsil clamp was placed on the biceps tendon approximate 15 mm from the end. The intra-articular biceps portion was then resected. A new ArthroCare Smart stitch was  placed at the end of the biceps tendon in a lasso type fashion. A tenodesis was performed placing the biceps tendon at the top of the bicipital groove with a single Magnum 2 anchor.   A single Magnum M anchor was then placed at the articular margin of the humeral head and greater tuberosity. The 4 suture limbs of the Magnum M anchor were passed medially through the rotator cuff using a first pass suture passer. The Smart stitches from the lateral border of the rotator cuff were then anchored to the greater tuberosity of the humeral head using two Magnum 2 anchors. These anchors were tensioned to allow for anatomic reduction of the rotator cuff to the greater tuberosity footprint. The medial row repair was then completed using an arthroscopic knot tying technique with the Magnum M anchor sutures. Once all sutures were tied down, arthroscopic images of the double row repair were taken with the arthroscope both externally and arthroscopically fromthe glenohumeral joint.  All incisions were copiously irrigated. The deltoid fascia was repaired using a 0 Vicryl suturean interrupted fashion. . The subcutaneous tissue of all incisions were closed with a 2-0 Vicryl. Skin closure for the arthroscopic incisions was performed with 4-0 nylon. The skin edges of the saber incision were approximated with a running 4-0 undyed Monocryl. A dry sterile dressing was applied. The patient was placed in an abduction sling, with a Polar Care sleeve.  All sharp and instrument counts were correct at the conclusion of the case. I was scrubbed and present for the entire case. I spoke with the patient's friends postoperatively to let them know the case had gone without complication and the patient was stable in recovery room.

## 2014-09-03 NOTE — Transfer of Care (Signed)
Immediate Anesthesia Transfer of Care Note  Patient: Briana [name]  Procedure(s) Performed: Procedure(s) with comments: RIGHT SHOULDER ARTHROSCOPY WITH MINI OPEN ROTATOR CUFF TEAR (Right) - biceps tenodesis, arthroscopic subacromial decompression and distal clavicle incision  Patient Location: PACU  Anesthesia Type:General  Level of Consciousness: awake and sedated  Airway & Oxygen Therapy: Patient Spontanous Breathing and Patient connected to face mask oxygen  Post-op Assessment: Report given to RN and Post -op Vital signs reviewed and stable  Post vital signs: Reviewed and stable  Last Vitals:  Filed Vitals:   09/03/14 1300  BP: 111/98  Pulse: 82  Temp:   Resp: 19    Complications: No apparent anesthesia complications

## 2014-09-03 NOTE — H&P (Signed)
PREOPERATIVE H&P  Chief Complaint: DISORDER OF BURSA   HPI: Briana [name] is a 49 y.o. female who presents for preoperative history and physical with a diagnosis of right shoulder rotator cuff tear. Symptoms are rated as moderate to severe, and have been worsening.  This is significantly impairing activities of daily living. She has failed nonoperative management including cortical steroids injections and physical therapy. She has elected for surgical management.   Past Medical History  Diagnosis Date  . GERD (gastroesophageal reflux disease)   . Fibromyalgia   . Joint disease   . Allergy   . Asthma   . Oxygen deficiency   . COPD (chronic obstructive pulmonary disease)   . Depression   . Chronic kidney disease   . Anxiety   . Arthritis   . Migraine   . Migraine    Past Surgical History  Procedure Laterality Date  . Neck surgery      lower neck fusion rods and screws  . Laparoscopic oopherectomy Left     unsure which side but thinks its the left  . Endometrial ablation    . Carpal tunnel release Bilateral   . Spinal injections     History   Social History  . Marital Status: Divorced    Spouse Name: N/A  . Number of Children: N/A  . Years of Education: N/A   Social History Main Topics  . Smoking status: Current Every Day Smoker -- 0.50 packs/day    Types: Cigarettes  . Smokeless tobacco: Not on file  . Alcohol Use: No  . Drug Use: No  . Sexual Activity: Not on file   Other Topics Concern  . None   Social History Narrative   Family History  Problem Relation Age of Onset  . Cancer Mother   . Cancer Father    Allergies  Allergen Reactions  . Cefuroxime Axetil Hives and Rash  . Iodinated Diagnostic Agents Hives and Rash  . Latex Rash  . Other Hives and Rash    Bee Stings   Prior to Admission medications   Medication Sig Start Date End Date Taking? Authorizing Provider  albuterol (ACCUNEB) 0.63 MG/3ML nebulizer solution Take 1 ampule by nebulization every 6  (six) hours as needed for wheezing.   Yes Historical Provider, MD  chlorzoxazone (PARAFON) 500 MG tablet Limit 1 tab by mouth per day or twice a day to 3 times a day if tolerated 08/19/14  Yes Mohammed Kindle, MD  cholecalciferol (VITAMIN D) 1000 UNITS tablet Take 2,000 Units by mouth daily.   Yes Historical Provider, MD  diclofenac sodium (VOLTAREN) 1 % GEL Apply topically 4 (four) times daily.   Yes Historical Provider, MD  EPINEPHrine (EPIPEN JR) 0.15 MG/0.3ML injection Inject 0.15 mg into the muscle as needed for anaphylaxis.   Yes Historical Provider, MD  estrogen, conjugated,-medroxyprogesterone (PREMPRO) 0.3-1.5 MG per tablet Take 1 tablet by mouth daily.   Yes Historical Provider, MD  fluticasone (FLONASE) 50 MCG/ACT nasal spray Place into both nostrils daily.   Yes Historical Provider, MD  HYDROcodone-acetaminophen (NORCO) 7.5-325 MG per tablet Limit one tablet by mouth 3-5 times per day if tolerated 08/19/14  Yes Mohammed Kindle, MD  lidocaine (LIDODERM) 5 % Apply 1-2 patches to skin for 12 hours then remove for 12 hours and repeat process if tolerated 07/21/14  Yes Mohammed Kindle, MD  LORazepam (ATIVAN) 0.5 MG tablet Take 0.5 mg by mouth every 8 (eight) hours.   Yes Historical Provider, MD  magnesium oxide (MAG-OX)  400 MG tablet Take 400 mg by mouth daily.   Yes Historical Provider, MD  Multiple Vitamin (MULTIVITAMIN) tablet Take 1 tablet by mouth daily.   Yes Historical Provider, MD  omeprazole (PRILOSEC) 20 MG capsule Take 20 mg by mouth daily.   Yes Historical Provider, MD  promethazine (PHENERGAN) 25 MG suppository Place 25 mg rectally every 6 (six) hours as needed for nausea or vomiting.   Yes Historical Provider, MD  tiotropium (SPIRIVA) 18 MCG inhalation capsule Place 18 mcg into inhaler and inhale daily.   Yes Historical Provider, MD  topiramate (TOPAMAX) 100 MG tablet Take 100 mg by mouth 2 (two) times daily.   Yes Historical Provider, MD  traMADol (ULTRAM) 50 MG tablet Limit one tablet  by mouth 1-3 times per day if tolerated 08/19/14  Yes Mohammed Kindle, MD  vitamin E (VITAMIN E) 200 UNIT capsule Take 200 Units by mouth daily.   Yes Historical Provider, MD     Positive ROS: All other systems have been reviewed and were otherwise negative with the exception of those mentioned in the HPI and as above.  Physical Exam: General: Alert, no acute distress Cardiovascular: Regular rate and rhythm, no murmurs rubs or gallops.  No pedal edema Respiratory: Clear to auscultation bilaterally, no wheezes rales or rhonchi. No cyanosis, no use of accessory musculature GI: No organomegaly, abdomen is soft and non-tender nondistended with positive bowel sounds. Skin: Skin intact, no lesions within the operative field. Neurologic: Sensation intact distally Psychiatric: Patient is competent for consent with normal mood and affect Lymphatic: No axillary or cervical lymphadenopathy  MUSCULOSKELETAL: Patient has pain in the right shoulder with active abduction and forward elevation at 90 and above. She has pain with a downward directed force and abducted shoulder. She has mild weakness of the supraspinatus to manual strength testing. She has mild tenderness over the before meals joint. Her range of motion in the right shoulder is limited secondary to pain. With passive assistance she can achieve nearly 160 of forward elevation and abduction. She has full digital wrist and elbow range of motion. She has palpable radial pulses and intact sensation light touch throughout the right upper extremity.  Assessment: Right shoulder rotator cuff tear  Plan: Plan for Procedure(s): SHOULDER ARTHROSCOPY WITH OPEN ROTATOR CUFF REPAIR AND DISTAL CLAVICLE ACROMINECTOMY  I reviewed with the patient the findings on her MRI including a partial tear of the right shoulder rotator cuff. She has tried and failed all nonoperative management including cortisone injection and physical therapy. Patient has elected to  undergo surgical rotator cuff repair. We discussed the details of the operation as well as the postoperative course.  I discussed the risks and benefits of surgery. The risks include but are not limited to infection, bleeding requiring blood transfusion, nerve or blood vessel injury, joint stiffness or loss of motion, persistent pain, weakness or instability, and hardware failure and the need for further surgery. Medical risks include but are not limited to DVT and pulmonary embolism, myocardial infarction, stroke, pneumonia, respiratory failure and death. Patient understood these risks and wished to proceed.   Thornton Park, MD   09/03/2014 1:07 PM

## 2014-09-04 ENCOUNTER — Telehealth: Payer: Self-pay | Admitting: Pain Medicine

## 2014-09-04 NOTE — Telephone Encounter (Signed)
Briana [name] Please have patient call her surgeon Dr.Krasinski to inform him of the pain she is having status post recent surgery patient will need for her surgeon to request that I take over treatment of her pain if she wishes for me to do such during the immediate postoperative period

## 2014-09-04 NOTE — Telephone Encounter (Signed)
Pt had surgery yesterday, in extreme pain / dr crisp told her to call and he would help her with pain

## 2014-09-04 NOTE — Telephone Encounter (Signed)
Spoke with Dr. Primus Bravo, pt should call her surgeon. Pt informed of this. States she will call an ambulance and go to the ED

## 2014-09-05 LAB — SURGICAL PATHOLOGY

## 2014-09-10 ENCOUNTER — Telehealth: Payer: Self-pay | Admitting: Pain Medicine

## 2014-09-10 NOTE — Telephone Encounter (Signed)
Thank you for the follow-up. Patient is status post surgery. It is okay for her to receive medications from Dr. Mack Guise at this time

## 2014-09-10 NOTE — Telephone Encounter (Signed)
Ms. Tirpak came by today after seeing Dr. Darlyn Chamber / he gave her some pain meds and will be in touch with Dr. Primus Bravo

## 2014-09-15 ENCOUNTER — Ambulatory Visit: Payer: Medicare Other | Attending: Orthopedic Surgery | Admitting: Physical Therapy

## 2014-09-15 DIAGNOSIS — M25511 Pain in right shoulder: Secondary | ICD-10-CM | POA: Insufficient documentation

## 2014-09-15 DIAGNOSIS — M25611 Stiffness of right shoulder, not elsewhere classified: Secondary | ICD-10-CM

## 2014-09-15 DIAGNOSIS — R29898 Other symptoms and signs involving the musculoskeletal system: Secondary | ICD-10-CM | POA: Diagnosis not present

## 2014-09-15 NOTE — Patient Instructions (Signed)
  Shoulder flexion  Lie on your back. Bend your right elbow to 90 degrees. Use your left arm to lift you right arm unto flexion. Hold for 3 seconds. Repeat 10 times 2 times per day        Internal Rotation (Eccentric), Active-Assist - Supine (Cane)   Lie on back, affected arm out from side, elbow at 45, forearm raised. Slowly lower arm for 3-5 seconds to table. Use cane to help return forearm to upright position. _10__ reps per set, __2-3_ sets per day, __5_ days per week.

## 2014-09-16 ENCOUNTER — Encounter: Payer: Self-pay | Admitting: Physical Therapy
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
## 2014-09-17 ENCOUNTER — Encounter: Payer: Self-pay | Admitting: Physical Therapy

## 2014-09-17 ENCOUNTER — Ambulatory Visit: Payer: Medicare Other | Admitting: Physical Therapy

## 2014-09-17 DIAGNOSIS — M25611 Stiffness of right shoulder, not elsewhere classified: Secondary | ICD-10-CM

## 2014-09-17 DIAGNOSIS — M25511 Pain in right shoulder: Secondary | ICD-10-CM

## 2014-09-17 DIAGNOSIS — R29898 Other symptoms and signs involving the musculoskeletal system: Secondary | ICD-10-CM
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
## 2014-09-18 ENCOUNTER — Ambulatory Visit: Payer: Medicare Other | Attending: Pain Medicine | Admitting: Pain Medicine

## 2014-09-18 ENCOUNTER — Encounter: Payer: Self-pay | Admitting: Pain Medicine

## 2014-09-18 VITALS — BP 96/57 | HR 90 | Temp 98.3°F | Resp 16 | Ht 61.0 in | Wt 165.0 lb

## 2014-09-18 DIAGNOSIS — M5136 Other intervertebral disc degeneration, lumbar region: Secondary | ICD-10-CM

## 2014-09-18 DIAGNOSIS — M5481 Occipital neuralgia: Secondary | ICD-10-CM

## 2014-09-18 DIAGNOSIS — M25512 Pain in left shoulder: Secondary | ICD-10-CM | POA: Diagnosis present

## 2014-09-18 DIAGNOSIS — M5022 Other cervical disc displacement, mid-cervical region: Secondary | ICD-10-CM | POA: Diagnosis not present

## 2014-09-18 DIAGNOSIS — M4804 Spinal stenosis, thoracic region: Secondary | ICD-10-CM | POA: Insufficient documentation

## 2014-09-18 DIAGNOSIS — M5134 Other intervertebral disc degeneration, thoracic region: Secondary | ICD-10-CM | POA: Insufficient documentation

## 2014-09-18 DIAGNOSIS — Z9889 Other specified postprocedural states: Secondary | ICD-10-CM | POA: Insufficient documentation

## 2014-09-18 DIAGNOSIS — M47812 Spondylosis without myelopathy or radiculopathy, cervical region: Secondary | ICD-10-CM | POA: Diagnosis not present

## 2014-09-18 DIAGNOSIS — M5124 Other intervertebral disc displacement, thoracic region: Secondary | ICD-10-CM | POA: Diagnosis not present

## 2014-09-18 DIAGNOSIS — M5030 Other cervical disc degeneration, unspecified cervical region: Secondary | ICD-10-CM | POA: Diagnosis not present

## 2014-09-18 DIAGNOSIS — M533 Sacrococcygeal disorders, not elsewhere classified: Secondary | ICD-10-CM

## 2014-09-18 DIAGNOSIS — M47816 Spondylosis without myelopathy or radiculopathy, lumbar region: Secondary | ICD-10-CM

## 2014-09-18 DIAGNOSIS — M170 Bilateral primary osteoarthritis of knee: Secondary | ICD-10-CM | POA: Insufficient documentation

## 2014-09-18 DIAGNOSIS — M542 Cervicalgia: Secondary | ICD-10-CM | POA: Diagnosis present

## 2014-09-18 DIAGNOSIS — M25511 Pain in right shoulder: Secondary | ICD-10-CM | POA: Diagnosis present

## 2014-09-18 DIAGNOSIS — Z981 Arthrodesis status: Secondary | ICD-10-CM

## 2014-09-18 DIAGNOSIS — M19011 Primary osteoarthritis, right shoulder: Secondary | ICD-10-CM

## 2014-09-18 MED ORDER — OXYCODONE HCL 5 MG PO TABS: ORAL_TABLET | ORAL | Status: DC

## 2014-09-18 MED ORDER — TRAMADOL HCL 50 MG PO TABS: ORAL_TABLET | ORAL | Status: DC

## 2014-09-18 NOTE — Patient Instructions (Addendum)
Continue present medications oxycodone and Ultram  (tramadol )  No hydrocodone acetaminophen  CAUTION oxycodone and Ultram can cause respiratory depression, confusion and other side effects. Exercise extreme caution when taking these medications as discussed and   DO NOT take any hydrocodone acetaminophen  F/U PCP Dr. Stephanie Coup  for evaliation of  BP, renal condition  and general medical  condition  F/U surgical evaluation Dr.Krasinski  Simona Huh post shoulder surgery   F/U Dr. Holley Raring for nephrologicall evaluation as planned F/U neurological evaluation  May consider radiofrequency rhizolysis or intraspinal procedures pending response to present treatment and F/U evaluation   Patient to call [facility] should patient have concerns prior to scheduled return appointmen.

## 2014-09-18 NOTE — Progress Notes (Signed)
Safety precautions to be maintained throughout the outpatient stay will include: orient to surroundings, keep bed in low position, maintain call bell within reach at all times, provide assistance with transfer out of bed and ambulation.  

## 2014-09-18 NOTE — Progress Notes (Signed)
   Subjective:    Patient ID: Briana [name], female    DOB: 02/28/1965, 49 y.o.   MRN: [id number]  HPI  Patient is 49 year old female returns to [place] for further evaluation and treatment of pain involving the neck and shoulders upper mid and lower back and lower extremity regions. Patient is with pain involving the region of the right shoulder severe degree status post surgical intervention of the right shoulder. At the present time we will avoid interventional treatment and will continue noninterventional treatment measures for treatment of patient's severe pain. Patient denies any other trauma change in events of daily living the call significant change in symptomatology. He was understanding and in agreement status treatment plan. The patient will follow-up with Dr.Krasinski for follow-up evaluation and treatment of shoulder as discussed and as planned. All understanding and in agreement status treatment plan.     Review of Systems     Objective:   Physical Exam  There was moderate tenderness of the splenius capitis and occipitalis region. Palpation over the region of the acromioclavicular region on the left was with moderate tends to palpation. The patient was with the right upper extremity in a sling. Patient was with decreased grip strength. Tinel and Phalen's maneuver without increased pain of any significant degree. There was tenderness over the trapezius musculature region and the levator scapula rhomboid much region of moderate to moderately severe degree. Palpation over the thoracic paraspinal machine thoracic facet region was with moderate to moderately severe discomfort. With no crepitus of the thoracic region haven't been noted. She'll the lumbar paraspinal muscles and lumbar facet region associated with moderate discomfort with palpation over the lumbar paraspinal muscular region reproducing moderate to moderately severe discomfort. There was straight leg raising  tolerates approximately 30 without increased pain with dorsiflexion noted. There was negative clonus negative Homans no definite sensory deficit of dermatomal distribution detected. Knees with crepitus of the knees noted anterior and posterior drawer signs negative. There was no tends to palpation of the abdomen there was no significant costovertebral angle tenderness noted.      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 C5-C6 small central disc protrusion and mild impression upon the ventral thecal sac, C6-7 mild broad-based disc osteophyte complex more focal centrally and impression upon the ventral thecal sac, C4-5 mild right foraminal stenosis degenerative changes  Thoracic degenerative disc disease T9-10, T10-11, T11-12, degenerative changes with central canal stenosis  Degenerative disc disease lumbar spine T12-L1, L1-2, L2-3,, L3-4, L4-5, L5-S1 with paracentral disc bulging partial effacement of the anterior cerebrospinal fluid space  Lumbar facet syndrome  Sacroiliac joint dysfunction  Degenerative joint disease of the knees  Status post recent shoulder surgery   Plan  Continue present medications.Parafon Forte tramadol and oxycodone  F/U PCP DR. F khan   for evaliation of  BP and general medical  condition  F/U surgical evaluation with Dr.Krasinski status post recent surgery of the shoulder   F/U neurological evaluation  May consider radiofrequency rhizolysis or intraspinal procedures pending response to present treatment and F/U evaluation   Patient to call [facility] should patient have concerns prior to scheduled return appointmen.

## 2014-09-23 ENCOUNTER — Ambulatory Visit: Payer: Medicare Other | Admitting: Physical Therapy

## 2014-09-23 ENCOUNTER — Encounter: Payer: Self-pay | Admitting: Physical Therapy

## 2014-09-23 DIAGNOSIS — R29898 Other symptoms and signs involving the musculoskeletal system: Secondary | ICD-10-CM

## 2014-09-23 DIAGNOSIS — M25611 Stiffness of right shoulder, not elsewhere classified: Secondary | ICD-10-CM

## 2014-09-23 DIAGNOSIS — M25511 Pain in right shoulder: Secondary | ICD-10-CM
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
## 2014-09-25 ENCOUNTER — Encounter: Payer: Self-pay | Admitting: Physical Therapy

## 2014-09-25 ENCOUNTER — Encounter: Payer: Medicare Other | Admitting: Physical Therapy

## 2014-09-25 ENCOUNTER — Ambulatory Visit: Payer: Medicare Other | Admitting: Physical Therapy

## 2014-09-25 DIAGNOSIS — R29898 Other symptoms and signs involving the musculoskeletal system: Secondary | ICD-10-CM | POA: Diagnosis not present

## 2014-09-25 DIAGNOSIS — M25611 Stiffness of right shoulder, not elsewhere classified: Secondary | ICD-10-CM

## 2014-09-25 DIAGNOSIS — M25511 Pain in right shoulder: Secondary | ICD-10-CM
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
## 2014-09-30 ENCOUNTER — Ambulatory Visit: Payer: Medicare Other | Admitting: Physical Therapy

## 2014-09-30 ENCOUNTER — Encounter: Payer: Medicare Other | Admitting: Physical Therapy

## 2014-09-30 ENCOUNTER — Encounter: Payer: Self-pay | Admitting: Physical Therapy

## 2014-09-30 DIAGNOSIS — M25511 Pain in right shoulder: Secondary | ICD-10-CM

## 2014-09-30 DIAGNOSIS — M25611 Stiffness of right shoulder, not elsewhere classified: Secondary | ICD-10-CM

## 2014-09-30 DIAGNOSIS — R29898 Other symptoms and signs involving the musculoskeletal system: Secondary | ICD-10-CM
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
## 2014-10-02 ENCOUNTER — Encounter: Payer: Medicare Other | Admitting: Physical Therapy

## 2014-10-03 ENCOUNTER — Ambulatory Visit: Payer: Medicare Other | Attending: Orthopedic Surgery

## 2014-10-03 ENCOUNTER — Encounter: Payer: Self-pay | Admitting: Physical Therapy

## 2014-10-03 DIAGNOSIS — M25511 Pain in right shoulder: Secondary | ICD-10-CM

## 2014-10-03 DIAGNOSIS — M25611 Stiffness of right shoulder, not elsewhere classified: Secondary | ICD-10-CM

## 2014-10-03 DIAGNOSIS — R29898 Other symptoms and signs involving the musculoskeletal system: Secondary | ICD-10-CM | POA: Diagnosis present
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
## 2014-10-03 NOTE — Patient Instructions (Signed)
Shoulder: Abduction (Supine)   With right arm flat on floor, hold dowel in palm. Slowly move arm up to side of head by pushing with opposite arm. Do not let elbow bend. Hold __3__ seconds. Repeat __10__ times. Do __2__ sessions per day. CAUTION: Stretch slowly and gently.  Copyright  VHI. All rights reserved.

## 2014-10-07 ENCOUNTER — Encounter: Payer: Medicare Other | Admitting: Physical Therapy

## 2014-10-07 ENCOUNTER — Ambulatory Visit: Payer: Medicare Other

## 2014-10-07 ENCOUNTER — Encounter: Payer: Self-pay | Admitting: Physical Therapy

## 2014-10-07 DIAGNOSIS — M25611 Stiffness of right shoulder, not elsewhere classified: Secondary | ICD-10-CM

## 2014-10-07 DIAGNOSIS — R29898 Other symptoms and signs involving the musculoskeletal system: Secondary | ICD-10-CM

## 2014-10-07 DIAGNOSIS — M25511 Pain in right shoulder: Secondary | ICD-10-CM
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
## 2014-10-09 ENCOUNTER — Encounter: Payer: Medicare Other | Admitting: Physical Therapy

## 2014-10-10 ENCOUNTER — Ambulatory Visit: Payer: Medicare Other | Admitting: Physical Therapy

## 2014-10-10 ENCOUNTER — Encounter: Payer: Self-pay | Admitting: Physical Therapy

## 2014-10-10 DIAGNOSIS — M25511 Pain in right shoulder: Secondary | ICD-10-CM

## 2014-10-10 DIAGNOSIS — R29898 Other symptoms and signs involving the musculoskeletal system: Secondary | ICD-10-CM

## 2014-10-10 DIAGNOSIS — M25611 Stiffness of right shoulder, not elsewhere classified: Secondary | ICD-10-CM
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
## 2014-10-14 ENCOUNTER — Ambulatory Visit: Payer: Medicare Other | Admitting: Physical Therapy

## 2014-10-14 ENCOUNTER — Encounter: Payer: Self-pay | Admitting: Physical Therapy

## 2014-10-14 DIAGNOSIS — M25511 Pain in right shoulder: Secondary | ICD-10-CM | POA: Diagnosis not present

## 2014-10-14 DIAGNOSIS — M25611 Stiffness of right shoulder, not elsewhere classified: Secondary | ICD-10-CM

## 2014-10-14 DIAGNOSIS — R29898 Other symptoms and signs involving the musculoskeletal system: Secondary | ICD-10-CM
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
## 2014-10-17 ENCOUNTER — Encounter: Payer: Self-pay | Admitting: Physical Therapy

## 2014-10-17 ENCOUNTER — Ambulatory Visit: Payer: Medicare Other | Admitting: Physical Therapy

## 2014-10-17 DIAGNOSIS — M25511 Pain in right shoulder: Secondary | ICD-10-CM

## 2014-10-17 DIAGNOSIS — M25611 Stiffness of right shoulder, not elsewhere classified: Secondary | ICD-10-CM

## 2014-10-17 DIAGNOSIS — R29898 Other symptoms and signs involving the musculoskeletal system: Secondary | ICD-10-CM

## 2014-10-17 NOTE — Patient Instructions (Signed)
Flexion (Isometric)   Press right fist against wall. Hold __3__ seconds. Repeat _10___ times. Do _3__ sessions per day.  http://gt2.exer.us/114   Copyright  VHI. All rights reserved.  Extension (Isometric)   Place left bent elbow and back of arm against wall. Press elbow against wall. Hold __3__ seconds. Repeat __10__ times. Do _3___ sessions per day.  http://gt2.exer.us/112   Copyright  VHI. All rights reserved.  Internal Rotation (Isometric)   Place palm of right fist against door frame, with elbow bent. Press fist against door frame. Hold _3___ seconds. Repeat __10__ times. Do _3___ sessions per day.  http://gt2.exer.us/108   Copyright  VHI. All rights reserved.  External Rotation (Isometric)   Place back of left fist against door frame, with elbow bent. Press fist against door frame. Hold _3___ seconds. Repeat __10__ times. Do _3___ sessions per day.  http://gt2.exer.us/110   Copyright  VHI. All rights reserved.  SHOULDER: Abduction (Isometric)   Use wall as resistance. Press arm against pillow. Keep elbow straight. Hold _3__ seconds. _10__ reps per set, _3__ sets per day, __7_ days per week  Copyright  VHI. All rights reserved.
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
## 2014-10-21 ENCOUNTER — Encounter: Payer: Self-pay | Admitting: Physical Therapy

## 2014-10-21 ENCOUNTER — Ambulatory Visit: Payer: Medicare Other | Admitting: Physical Therapy

## 2014-10-21 ENCOUNTER — Ambulatory Visit: Payer: Medicare Other | Attending: Pain Medicine | Admitting: Pain Medicine

## 2014-10-21 ENCOUNTER — Encounter: Payer: Self-pay | Admitting: Pain Medicine

## 2014-10-21 VITALS — BP 107/74 | HR 94 | Temp 98.3°F | Resp 16 | Ht 61.0 in | Wt 170.0 lb

## 2014-10-21 DIAGNOSIS — M542 Cervicalgia: Secondary | ICD-10-CM | POA: Diagnosis present

## 2014-10-21 DIAGNOSIS — M5126 Other intervertebral disc displacement, lumbar region: Secondary | ICD-10-CM | POA: Diagnosis not present

## 2014-10-21 DIAGNOSIS — M79602 Pain in left arm: Secondary | ICD-10-CM | POA: Diagnosis present

## 2014-10-21 DIAGNOSIS — M5136 Other intervertebral disc degeneration, lumbar region: Secondary | ICD-10-CM | POA: Diagnosis not present

## 2014-10-21 DIAGNOSIS — M4802 Spinal stenosis, cervical region: Secondary | ICD-10-CM | POA: Insufficient documentation

## 2014-10-21 DIAGNOSIS — M533 Sacrococcygeal disorders, not elsewhere classified: Secondary | ICD-10-CM | POA: Insufficient documentation

## 2014-10-21 DIAGNOSIS — M19012 Primary osteoarthritis, left shoulder: Secondary | ICD-10-CM

## 2014-10-21 DIAGNOSIS — R29898 Other symptoms and signs involving the musculoskeletal system: Secondary | ICD-10-CM

## 2014-10-21 DIAGNOSIS — M47814 Spondylosis without myelopathy or radiculopathy, thoracic region: Secondary | ICD-10-CM | POA: Diagnosis not present

## 2014-10-21 DIAGNOSIS — M25611 Stiffness of right shoulder, not elsewhere classified: Secondary | ICD-10-CM

## 2014-10-21 DIAGNOSIS — M25511 Pain in right shoulder: Secondary | ICD-10-CM

## 2014-10-21 DIAGNOSIS — M5481 Occipital neuralgia: Secondary | ICD-10-CM

## 2014-10-21 DIAGNOSIS — M4804 Spinal stenosis, thoracic region: Secondary | ICD-10-CM | POA: Diagnosis not present

## 2014-10-21 DIAGNOSIS — M2578 Osteophyte, vertebrae: Secondary | ICD-10-CM | POA: Diagnosis not present

## 2014-10-21 DIAGNOSIS — M5030 Other cervical disc degeneration, unspecified cervical region: Secondary | ICD-10-CM | POA: Diagnosis not present

## 2014-10-21 DIAGNOSIS — M47812 Spondylosis without myelopathy or radiculopathy, cervical region: Secondary | ICD-10-CM | POA: Insufficient documentation

## 2014-10-21 DIAGNOSIS — G2581 Restless legs syndrome: Secondary | ICD-10-CM

## 2014-10-21 DIAGNOSIS — Z9889 Other specified postprocedural states: Secondary | ICD-10-CM | POA: Insufficient documentation

## 2014-10-21 DIAGNOSIS — M47816 Spondylosis without myelopathy or radiculopathy, lumbar region: Secondary | ICD-10-CM

## 2014-10-21 DIAGNOSIS — M19011 Primary osteoarthritis, right shoulder: Secondary | ICD-10-CM

## 2014-10-21 DIAGNOSIS — M170 Bilateral primary osteoarthritis of knee: Secondary | ICD-10-CM

## 2014-10-21 DIAGNOSIS — M179 Osteoarthritis of knee, unspecified: Secondary | ICD-10-CM | POA: Diagnosis not present

## 2014-10-21 DIAGNOSIS — M5022 Other cervical disc displacement, mid-cervical region: Secondary | ICD-10-CM | POA: Insufficient documentation

## 2014-10-21 DIAGNOSIS — Z981 Arthrodesis status: Secondary | ICD-10-CM

## 2014-10-21 DIAGNOSIS — M79601 Pain in right arm: Secondary | ICD-10-CM | POA: Diagnosis present

## 2014-10-21 DIAGNOSIS — M5134 Other intervertebral disc degeneration, thoracic region: Secondary | ICD-10-CM

## 2014-10-21 MED ORDER — CHLORZOXAZONE 500 MG PO TABS: ORAL_TABLET | ORAL | Status: DC

## 2014-10-21 MED ORDER — TRAMADOL HCL 50 MG PO TABS: ORAL_TABLET | ORAL | Status: DC

## 2014-10-21 MED ORDER — OXYCODONE HCL 5 MG PO TABS: ORAL_TABLET | ORAL | Status: DC
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
## 2014-10-21 NOTE — Progress Notes (Signed)
Patient states she does not have a driver and would be taking a cab.  States she does not have anyone to come with her.  Pt notified that there has to be a responsible adult to accompany her from procedure.  Risks explained to patient by myself and Dr Primus Bravo.  States she will try to get a responsible adult to accompany her and was told to reschedule if she did not have a responsible adult with her.

## 2014-10-21 NOTE — Progress Notes (Signed)
Subjective:    Patient ID: Briana [name], female    DOB: [birth year], 49 y.o.   MRN: [id number]  HPI  Patient is 49 year old female returns to [facility] for further evaluation and treatment of pain involving neck upper extremity region especially the right shoulder as well as lower back and lower extremity regions especially the right knee. Patient states that she has return of significant pain involving the region of the right knee and wishes to undergo interventional treatment for pain of the right knee. We discussed patient's condition and will consider patient for geniculate nerve block of the need to be performed at time return appointment as discussed. The patient was in agreement with suggested treatment plan.  The patient will follow-up with Dr. Tanja Port for evaluation of pain involving the shoulder status post surgery of the shoulder as planned.      Review of Systems     Objective:   Physical Exam There was tenderness of the splenius capitis and occipitalis musculature region of moderate degree. There was moderate tennis of the cervical facet cervical paraspinal musculature region and the thoracic facet thoracic paraspinal musculature region with no crepitus of the thoracic region noted. Patient appeared to be with decreased grip strength on the right compared to the left and was with limited range of motion of the right shoulder compared to the left shoulder. There was tends to palpation over the lower thoracic thoracic paraspinal muscle and thoracic facet region with no crepitus of the thoracic region noted. Tinel and Phalen's maneuver were without increase of pain of significant degree. Palpation over the lumbar paraspinal muscles region lumbar facet region was a tends to palpation of moderate degree. Lateral bending and rotation and extension and palpation of the lumbar facets reproduce moderate discomfort. There was tends to palpation of the right knee. There was  negative anterior and posterior drawer signs with no ballottement of the patella noted. There was significant crepitus of the knee and severe increase of pain with range of motion maneuvers of the knee performed. There was decreased EHL strength right lower extremity compared to the left lower extremity. No sensory deficit of dermatomal distribution detected. There was negative clonus negative Homans. Abdomen was nontender with no costovertebral tenderness noted. The predominant portion of patient's pain was reproduced with palpation and range of motion maneuvers performed on the right knee.       Assessment & Plan:      Degenerative disc disease cervical spine Central spondylosis C5-C6 small central disc protrusion and mild impression upon the ventral thecal sac, C6-7 mild broad-based disc osteophyte complex more focal centrally and impression upon the ventral thecal sac, C4-5 mild right foraminal stenosis degenerative changes  Thoracic degenerative disc disease T9-10, T10-11, T11-12, degenerative changes with central canal stenosis  Degenerative disc disease lumbar spine T12-L1, L1-2, L2-3,, L3-4, L4-5, L5-S1 with paracentral disc bulging partial effacement of the anterior cerebrospinal fluid space  Lumbar facet syndrome  Degenerative joint disease of knee  Status post surgery of the right shoulder  Sacroiliac joint dysfunction     PLAN   Continue present medication Parafon forte Ultram and oxycodone.  As explained to patient, we prefer to avoid prescribing acetaminophen for patient. Patient will need permission from her treating physician to medically clear patient patient to receive opioid with acetaminophen prior to our  prescribing such for patient as explained to patient on today's visit  Geniculate nerve blocks of need to be performed at time return appointment  F/U  PCP  Dr.F Humphrey Rolls for evaliation of  BP and general medical  condition  F/U surgical evaluation. F/U  With  Dr.Kransinski as planned. F/U Dr. Trenton Gammon as discussed  F/U neurological evaluation. May consider pending follow-up evaluations  May consider radiofrequency rhizolysis or intraspinal procedures pending response to present treatment and F/U evaluation   Patient to call [facility] should patient have concerns prior to scheduled return appointment.

## 2014-10-21 NOTE — Patient Instructions (Addendum)
PLAN   Continue present medication Parafon Forte  Ultram and oxycodone. As discussed have your physician provide documentation that you are cleared to receive hydrocodone acetaminophen and we will replace the oxycodone with hydrocodone acetaminophen as discussed  Geniculate nerve blocks of knee to be performed at time of return appointment as discussed  F/U PCP  Dr. Stephanie Coup  for evaliation of  BP and general medical  condition  F/U surgical evaluation. Follow-up with Dr.Kransinski as planned  Continue physical therapy and avoid aggravation of his symptoms. Continue use of TENS unit   F/U neurological evaluation. May consider pending follow-up evaluations  May consider radiofrequency rhizolysis or intraspinal procedures pending response to present treatment and F/U evaluation   Patient to call [facility] should patient have concerns prior to scheduled return appointment. GENERAL RISKS AND COMPLICATIONS  What are the risk, side effects and possible complications? Generally speaking, most procedures are safe.  However, with any procedure there are risks, side effects, and the possibility of complications.  The risks and complications are dependent upon the sites that are lesioned, or the type of nerve block to be performed.  The closer the procedure is to the spine, the more serious the risks are.  Great care is taken when placing the radio frequency needles, block needles or lesioning probes, but sometimes complications can occur. 1. Infection: Any time there is an injection through the skin, there is a risk of infection.  This is why sterile conditions are used for these blocks.  There are four possible types of infection. 1. Localized skin infection. 2. Central Nervous System Infection-This can be in the form of Meningitis, which can be deadly. 3. Epidural Infections-This can be in the form of an epidural abscess, which can cause pressure inside of the spine, causing compression  of the spinal cord with subsequent paralysis. This would require an emergency surgery to decompress, and there are no guarantees that the patient would recover from the paralysis. 4. Discitis-This is an infection of the intervertebral discs.  It occurs in about 1% of discography procedures.  It is difficult to treat and it may lead to surgery.        2. Pain: the needles have to go through skin and soft tissues, will cause soreness.       3. Damage to internal structures:  The nerves to be lesioned may be near blood vessels or    other nerves which can be potentially damaged.       4. Bleeding: Bleeding is more common if the patient is taking blood thinners such as  aspirin, Coumadin, Ticiid, Plavix, etc., or if he/she have some genetic predisposition  such as hemophilia. Bleeding into the spinal canal can cause compression of the spinal  cord with subsequent paralysis.  This would require an emergency surgery to  decompress and there are no guarantees that the patient would recover from the  paralysis.       5. Pneumothorax:  Puncturing of a lung is a possibility, every time a needle is introduced in  the area of the chest or upper back.  Pneumothorax refers to free air around the  collapsed lung(s), inside of the thoracic cavity (chest cavity).  Another two possible  complications related to a similar event would include: Hemothorax and Chylothorax.   These are variations of the Pneumothorax, where instead of air around the collapsed  lung(s), you may have blood or chyle, respectively.       6. Spinal  headaches: They may occur with any procedures in the area of the spine.       7. Persistent CSF (Cerebro-Spinal Fluid) leakage: This is a rare problem, but may occur  with prolonged intrathecal or epidural catheters either due to the formation of a fistulous  track or a dural tear.       8. Nerve damage: By working so close to the spinal cord, there is always a possibility of  nerve damage, which could be  as serious as a permanent spinal cord injury with  paralysis.       9. Death:  Although rare, severe deadly allergic reactions known as "Anaphylactic  reaction" can occur to any of the medications used.      10. Worsening of the symptoms:  We can always make thing worse.  What are the chances of something like this happening? Chances of any of this occuring are extremely low.  By statistics, you have more of a chance of getting killed in a motor vehicle accident: while driving to the hospital than any of the above occurring .  Nevertheless, you should be aware that they are possibilities.  In general, it is similar to taking a shower.  Everybody knows that you can slip, hit your head and get killed.  Does that mean that you should not shower again?  Nevertheless always keep in mind that statistics do not mean anything if you happen to be on the wrong side of them.  Even if a procedure has a 1 (one) in a 1,000,000 (million) chance of going wrong, it you happen to be that one..Also, keep in mind that by statistics, you have more of a chance of having something go wrong when taking medications.  Who should not have this procedure? If you are on a blood thinning medication (e.g. Coumadin, Plavix, see list of "Blood Thinners"), or if you have an active infection going on, you should not have the procedure.  If you are taking any blood thinners, please inform your physician.  How should I prepare for this procedure?  Do not eat or drink anything at least six hours prior to the procedure.  Bring a driver with you .  It cannot be a taxi.  Come accompanied by an adult that can drive you back, and that is strong enough to help you if your legs get weak or numb from the local anesthetic.  Take all of your medicines the morning of the procedure with just enough water to swallow them.  If you have diabetes, make sure that you are scheduled to have your procedure done first thing in the morning, whenever  possible.  If you have diabetes, take only half of your insulin dose and notify our nurse that you have done so as soon as you arrive at the clinic.  If you are diabetic, but only take blood sugar pills (oral hypoglycemic), then do not take them on the morning of your procedure.  You may take them after you have had the procedure.  Do not take aspirin or any aspirin-containing medications, at least eleven (11) days prior to the procedure.  They may prolong bleeding.  Wear loose fitting clothing that may be easy to take off and that you would not mind if it got stained with Betadine or blood.  Do not wear any jewelry or perfume  Remove any nail coloring.  It will interfere with some of our monitoring equipment.  NOTE: Remember that this is not meant  to be interpreted as a complete list of all possible complications.  Unforeseen problems may occur.  BLOOD THINNERS The following drugs contain aspirin or other products, which can cause increased bleeding during surgery and should not be taken for 2 weeks prior to and 1 week after surgery.  If you should need take something for relief of minor pain, you may take acetaminophen which is found in Tylenol,m Datril, Anacin-3 and Panadol. It is not blood thinner. The products listed below are.  Do not take any of the products listed below in addition to any listed on your instruction sheet.  A.P.C or A.P.C with Codeine Codeine Phosphate Capsules #3 Ibuprofen Ridaura  ABC compound Congesprin Imuran rimadil  Advil Cope Indocin Robaxisal  Alka-Seltzer Effervescent Pain Reliever and Antacid Coricidin or Coricidin-D  Indomethacin Rufen  Alka-Seltzer plus Cold Medicine Cosprin Ketoprofen S-A-C Tablets  Anacin Analgesic Tablets or Capsules Coumadin Korlgesic Salflex  Anacin Extra Strength Analgesic tablets or capsules CP-2 Tablets Lanoril Salicylate  Anaprox Cuprimine Capsules Levenox Salocol  Anexsia-D Dalteparin Magan Salsalate  Anodynos Darvon compound  Magnesium Salicylate Sine-off  Ansaid Dasin Capsules Magsal Sodium Salicylate  Anturane Depen Capsules Marnal Soma  APF Arthritis pain formula Dewitt's Pills Measurin Stanback  Argesic Dia-Gesic Meclofenamic Sulfinpyrazone  Arthritis Bayer Timed Release Aspirin Diclofenac Meclomen Sulindac  Arthritis pain formula Anacin Dicumarol Medipren Supac  Analgesic (Safety coated) Arthralgen Diffunasal Mefanamic Suprofen  Arthritis Strength Bufferin Dihydrocodeine Mepro Compound Suprol  Arthropan liquid Dopirydamole Methcarbomol with Aspirin Synalgos  ASA tablets/Enseals Disalcid Micrainin Tagament  Ascriptin Doan's Midol Talwin  Ascriptin A/D Dolene Mobidin Tanderil  Ascriptin Extra Strength Dolobid Moblgesic Ticlid  Ascriptin with Codeine Doloprin or Doloprin with Codeine Momentum Tolectin  Asperbuf Duoprin Mono-gesic Trendar  Aspergum Duradyne Motrin or Motrin IB Triminicin  Aspirin plain, buffered or enteric coated Durasal Myochrisine Trigesic  Aspirin Suppositories Easprin Nalfon Trillsate  Aspirin with Codeine Ecotrin Regular or Extra Strength Naprosyn Uracel  Atromid-S Efficin Naproxen Ursinus  Auranofin Capsules Elmiron Neocylate Vanquish  Axotal Emagrin Norgesic Verin  Azathioprine Empirin or Empirin with Codeine Normiflo Vitamin E  Azolid Emprazil Nuprin Voltaren  Bayer Aspirin plain, buffered or children's or timed BC Tablets or powders Encaprin Orgaran Warfarin Sodium  Buff-a-Comp Enoxaparin Orudis Zorpin  Buff-a-Comp with Codeine Equegesic Os-Cal-Gesic   Buffaprin Excedrin plain, buffered or Extra Strength Oxalid   Bufferin Arthritis Strength Feldene Oxphenbutazone   Bufferin plain or Extra Strength Feldene Capsules Oxycodone with Aspirin   Bufferin with Codeine Fenoprofen Fenoprofen Pabalate or Pabalate-SF   Buffets II Flogesic Panagesic   Buffinol plain or Extra Strength Florinal or Florinal with Codeine Panwarfarin   Buf-Tabs Flurbiprofen Penicillamine   Butalbital Compound  Four-way cold tablets Penicillin   Butazolidin Fragmin Pepto-Bismol   Carbenicillin Geminisyn Percodan   Carna Arthritis Reliever Geopen Persantine   Carprofen Gold's salt Persistin   Chloramphenicol Goody's Phenylbutazone   Chloromycetin Haltrain Piroxlcam   Clmetidine heparin Plaquenil   Cllnoril Hyco-pap Ponstel   Clofibrate Hydroxy chloroquine Propoxyphen         Before stopping any of these medications, be sure to consult the physician who ordered them.  Some, such as Coumadin (Warfarin) are ordered to prevent or treat serious conditions such as "deep thrombosis", "pumonary embolisms", and other heart problems.  The amount of time that you may need off of the medication may also vary with the medication and the reason for which you were taking it.  If you are taking any of these medications, please make sure you notify  your pain physician before you undergo any procedures.         Knee Injection Joint injections are shots. Your caregiver will place a needle into your knee joint. The needle is used to put medicine into the joint. These shots can be used to help treat different painful knee conditions such as osteoarthritis, bursitis, local flare-ups of rheumatoid arthritis, and pseudogout. Anti-inflammatory medicines such as corticosteroids and anesthetics are the most common medicines used for joint and soft tissue injections.  PROCEDURE 2. The skin over the kneecap will be cleaned with an antiseptic solution. 3. Your caregiver will inject a small amount of a local anesthetic (a medicine like Novocaine) just under the skin in the area that was cleaned. 4. After the area becomes numb, a second injection is done. This second injection usually includes an anesthetic and an anti-inflammatory medicine called a steroid or cortisone. The needle is carefully placed in between the kneecap and the knee, and the medicine is injected into the joint space. 5. After the injection is done, the  needle is removed. Your caregiver may place a bandage over the injection site. The whole procedure takes no more than a couple of minutes. BEFORE THE PROCEDURE  Wash all of the skin around the entire knee area. Try to remove any loose, scaling skin. There is no other specific preparation necessary unless advised otherwise by your caregiver. LET YOUR CAREGIVER KNOW ABOUT:   Allergies.  Medications taken including herbs, eye drops, over the counter medications, and creams.  Use of steroids (by mouth or creams).  Possible pregnancy, if applicable.  Previous problems with anesthetics or Novocaine.  History of blood clots (thrombophlebitis).  History of bleeding or blood problems.  Previous surgery.  Other health problems. RISKS AND COMPLICATIONS Side effects from cortisone shots are rare. They include:   Slight bruising of the skin.  Shrinkage of the normal fatty tissue under the skin where the shot was given.  Increase in pain after the shot.  Infection.  Weakening of tendons or tendon rupture.  Allergic reaction to the medicine.  Diabetics may have a temporary increase in their blood sugar after a shot.  Cortisone can temporarily weaken the immune system. While receiving these shots, you should not get certain vaccines. Also, avoid contact with anyone who has chickenpox or measles. Especially if you have never had these diseases or have not been previously immunized. Your immune system may not be strong enough to fight off the infection while the cortisone is in your system. AFTER THE PROCEDURE   You can go home after the procedure.  You may need to put ice on the joint 15-20 minutes every 3 or 4 hours until the pain goes away.  You may need to put an elastic bandage on the joint. HOME CARE INSTRUCTIONS  12. Only take over-the-counter or prescription medicines for pain, discomfort, or fever as directed by your caregiver. 13. You should avoid stressing the joint. Unless  advised otherwise, avoid activities that put a lot of pressure on a knee joint, such as: 1. Jogging. 2. Bicycling. 3. Recreational climbing. 4. Hiking. 14. Laying down and elevating the leg/knee above the level of your heart can help to minimize swelling. SEEK MEDICAL CARE IF:   You have repeated or worsening swelling.  There is drainage from the puncture area.  You develop red streaking that extends above or below the site where the needle was inserted. SEEK IMMEDIATE MEDICAL CARE IF:   You develop a fever.  You have pain that gets worse even though you are taking pain medicine.  The area is red and warm, and you have trouble moving the joint. MAKE SURE YOU:   Understand these instructions.  Will watch your condition.  Will get help right away if you are not doing well or get worse. Document Released: 04/10/2006 Document Revised: 04/11/2011 Document Reviewed: 01/05/2007 [facility] Patient Information 2015 [place], [place]. This information is not intended to replace advice given to you by your health care provider. Make sure you discuss any questions you have with your health care provider.

## 2014-10-21 NOTE — Progress Notes (Signed)
Safety precautions to be maintained throughout the outpatient stay will include: orient to surroundings, keep bed in low position, maintain call bell within reach at all times, provide assistance with transfer out of bed and ambulation.  

## 2014-10-24 ENCOUNTER — Ambulatory Visit: Payer: Medicare Other

## 2014-10-24 ENCOUNTER — Encounter: Payer: Self-pay | Admitting: Physical Therapy

## 2014-10-24 DIAGNOSIS — R29898 Other symptoms and signs involving the musculoskeletal system: Secondary | ICD-10-CM

## 2014-10-24 DIAGNOSIS — M25511 Pain in right shoulder: Secondary | ICD-10-CM

## 2014-10-24 DIAGNOSIS — M25611 Stiffness of right shoulder, not elsewhere classified: Secondary | ICD-10-CM
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
## 2014-10-29 ENCOUNTER — Encounter: Payer: Self-pay | Admitting: Physical Therapy

## 2014-10-29 ENCOUNTER — Ambulatory Visit: Payer: Medicare Other | Admitting: Physical Therapy

## 2014-10-29 DIAGNOSIS — M25511 Pain in right shoulder: Secondary | ICD-10-CM | POA: Diagnosis not present

## 2014-10-29 DIAGNOSIS — M25611 Stiffness of right shoulder, not elsewhere classified: Secondary | ICD-10-CM

## 2014-10-29 DIAGNOSIS — R29898 Other symptoms and signs involving the musculoskeletal system: Secondary | ICD-10-CM

## 2014-10-29 NOTE — Patient Instructions (Addendum)
   EXTERNAL ROTATION: Standing - Stable: Exercise Band (Active)   Stand, right arm bent to 90, elbow against side, hand forward. Against yellow resistance band, rotate forearm outward, keeping elbow at side. Rotate forearm outward as far as possible. Complete _2__ sets of _10__ repetitions. Perform _2__ sessions per day.  Copyright  VHI. All rights reserved.  INTERNAL ROTATION: Standing - Stable: Exercise Band (Active)   Stand, right arm bent to 90, elbow against side, forearm out from body. Against yellow resistance band, rotate arm in to body, keeping elbow at side. Complete __2_ sets of __10_ repetitions. Perform __2_ sessions per day.  Copyright  VHI. All rights reserved.
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
## 2014-10-31 ENCOUNTER — Encounter: Payer: Medicare Other | Admitting: Physical Therapy

## 2014-11-04 ENCOUNTER — Ambulatory Visit: Payer: Medicare Other | Attending: Orthopedic Surgery | Admitting: Physical Therapy

## 2014-11-04 DIAGNOSIS — R29898 Other symptoms and signs involving the musculoskeletal system: Secondary | ICD-10-CM | POA: Diagnosis not present

## 2014-11-04 DIAGNOSIS — M25511 Pain in right shoulder: Secondary | ICD-10-CM | POA: Diagnosis present

## 2014-11-04 DIAGNOSIS — M25611 Stiffness of right shoulder, not elsewhere classified: Secondary | ICD-10-CM

## 2014-11-05 ENCOUNTER — Encounter: Payer: Self-pay | Admitting: Pain Medicine

## 2014-11-05 ENCOUNTER — Ambulatory Visit: Payer: Medicare Other | Attending: Pain Medicine | Admitting: Pain Medicine

## 2014-11-05 VITALS — BP 128/63 | HR 85 | Temp 98.2°F | Resp 15 | Ht 61.0 in | Wt 176.0 lb

## 2014-11-05 DIAGNOSIS — M47816 Spondylosis without myelopathy or radiculopathy, lumbar region: Secondary | ICD-10-CM

## 2014-11-05 DIAGNOSIS — M25561 Pain in right knee: Secondary | ICD-10-CM | POA: Diagnosis present

## 2014-11-05 DIAGNOSIS — M1711 Unilateral primary osteoarthritis, right knee: Secondary | ICD-10-CM | POA: Diagnosis not present

## 2014-11-05 DIAGNOSIS — M5030 Other cervical disc degeneration, unspecified cervical region: Secondary | ICD-10-CM

## 2014-11-05 DIAGNOSIS — M533 Sacrococcygeal disorders, not elsewhere classified: Secondary | ICD-10-CM

## 2014-11-05 DIAGNOSIS — M19011 Primary osteoarthritis, right shoulder: Secondary | ICD-10-CM

## 2014-11-05 DIAGNOSIS — M5136 Other intervertebral disc degeneration, lumbar region: Secondary | ICD-10-CM

## 2014-11-05 DIAGNOSIS — M170 Bilateral primary osteoarthritis of knee: Secondary | ICD-10-CM

## 2014-11-05 DIAGNOSIS — M5134 Other intervertebral disc degeneration, thoracic region: Secondary | ICD-10-CM

## 2014-11-05 DIAGNOSIS — M545 Low back pain: Secondary | ICD-10-CM | POA: Diagnosis present

## 2014-11-05 DIAGNOSIS — M5481 Occipital neuralgia: Secondary | ICD-10-CM

## 2014-11-05 DIAGNOSIS — Z981 Arthrodesis status: Secondary | ICD-10-CM

## 2014-11-05 DIAGNOSIS — M19012 Primary osteoarthritis, left shoulder: Secondary | ICD-10-CM

## 2014-11-05 DIAGNOSIS — G2581 Restless legs syndrome: Secondary | ICD-10-CM

## 2014-11-05 MED ORDER — FENTANYL CITRATE (PF) 100 MCG/2ML IJ SOLN
100.0000 ug | Freq: Once | INTRAMUSCULAR | Status: AC
  Administered 2014-11-05: 100 ug via INTRAVENOUS

## 2014-11-05 MED ORDER — BUPIVACAINE HCL (PF) 0.25 % IJ SOLN
INTRAMUSCULAR | Status: AC
  Filled 2014-11-05: qty 30

## 2014-11-05 MED ORDER — LIDOCAINE HCL (PF) 1 % IJ SOLN: 10.0000 mL | Freq: Once | INTRAMUSCULAR | Status: DC

## 2014-11-05 MED ORDER — CIPROFLOXACIN IN D5W 400 MG/200ML IV SOLN
400.0000 mg | Freq: Once | INTRAVENOUS | Status: AC
  Administered 2014-11-05: 400 mg via INTRAVENOUS

## 2014-11-05 MED ORDER — FENTANYL CITRATE (PF) 100 MCG/2ML IJ SOLN
INTRAMUSCULAR | Status: AC
  Administered 2014-11-05: 100 ug via INTRAVENOUS
  Filled 2014-11-05: qty 2

## 2014-11-05 MED ORDER — BUPIVACAINE HCL (PF) 0.25 % IJ SOLN
30.0000 mL | Freq: Once | INTRAMUSCULAR | Status: AC
  Administered 2014-11-05: 13:00:00

## 2014-11-05 MED ORDER — CIPROFLOXACIN IN D5W 400 MG/200ML IV SOLN
INTRAVENOUS | Status: AC
  Administered 2014-11-05: 400 mg via INTRAVENOUS
  Filled 2014-11-05: qty 200

## 2014-11-05 MED ORDER — MIDAZOLAM HCL 5 MG/5ML IJ SOLN
INTRAMUSCULAR | Status: AC
  Administered 2014-11-05: 4 mg via INTRAVENOUS
  Filled 2014-11-05: qty 5

## 2014-11-05 MED ORDER — SODIUM CHLORIDE 0.9 % IJ SOLN: 20.0000 mL | Freq: Once | INTRAMUSCULAR | Status: DC

## 2014-11-05 MED ORDER — MIDAZOLAM HCL 5 MG/5ML IJ SOLN
5.0000 mg | Freq: Once | INTRAMUSCULAR | Status: AC
  Administered 2014-11-05: 4 mg via INTRAVENOUS

## 2014-11-05 MED ORDER — TRIAMCINOLONE ACETONIDE 40 MG/ML IJ SUSP
40.0000 mg | Freq: Once | INTRAMUSCULAR | Status: AC
  Administered 2014-11-05: 10 mg

## 2014-11-05 MED ORDER — TRIAMCINOLONE ACETONIDE 40 MG/ML IJ SUSP
INTRAMUSCULAR | Status: AC
Start: 2014-11-05 — End: 2014-11-05
  Administered 2014-11-05: 10 mg
  Filled 2014-11-05: qty 1

## 2014-11-05 MED ORDER — CIPROFLOXACIN HCL 250 MG PO TABS: 250.0000 mg | ORAL_TABLET | Freq: Two times a day (BID) | ORAL | Status: DC

## 2014-11-05 MED ORDER — LACTATED RINGERS IV SOLN: 1000.0000 mL | INTRAVENOUS | Status: DC

## 2014-11-05 NOTE — Progress Notes (Signed)
Safety precautions to be maintained throughout the outpatient stay will include: orient to surroundings, keep bed in low position, maintain call bell within reach at all times, provide assistance with transfer out of bed and ambulation.  

## 2014-11-05 NOTE — Patient Instructions (Addendum)
PLAN  Continue present medication and begin taking antibiotic Cipro as prescribed. Please obtain your antibiotic Cipro  today and begin taking antibiotic today  F/U PCP for evaliation of  BP and general medical  condition.  F/U surgical evaluation.  F/U neurological evaluation. May consider pending follow-up evaluations  May consider radiofrequency rhizolysis or intraspinal procedures pending response to present treatment and F/U evaluation.  Patient to call [facility] should patient have concerns prior to scheduled return appointment.  GENERAL RISKS AND COMPLICATIONS  What are the risk, side effects and possible complications? Generally speaking, most procedures are safe.  However, with any procedure there are risks, side effects, and the possibility of complications.  The risks and complications are dependent upon the sites that are lesioned, or the type of nerve block to be performed.  The closer the procedure is to the spine, the more serious the risks are.  Great care is taken when placing the radio frequency needles, block needles or lesioning probes, but sometimes complications can occur. 1. Infection: Any time there is an injection through the skin, there is a risk of infection.  This is why sterile conditions are used for these blocks.  There are four possible types of infection. 1. Localized skin infection. 2. Central Nervous System Infection-This can be in the form of Meningitis, which can be deadly. 3. Epidural Infections-This can be in the form of an epidural abscess, which can cause pressure inside of the spine, causing compression of the spinal cord with subsequent paralysis. This would require an emergency surgery to decompress, and there are no guarantees that the patient would recover from the paralysis. 4. Discitis-This is an infection of the intervertebral discs.  It occurs in about 1% of discography procedures.  It is difficult to treat and it may lead to  surgery.        2. Pain: the needles have to go through skin and soft tissues, will cause soreness.       3. Damage to internal structures:  The nerves to be lesioned may be near blood vessels or    other nerves which can be potentially damaged.       4. Bleeding: Bleeding is more common if the patient is taking blood thinners such as  aspirin, Coumadin, Ticiid, Plavix, etc., or if he/she have some genetic predisposition  such as hemophilia. Bleeding into the spinal canal can cause compression of the spinal  cord with subsequent paralysis.  This would require an emergency surgery to  decompress and there are no guarantees that the patient would recover from the  paralysis.       5. Pneumothorax:  Puncturing of a lung is a possibility, every time a needle is introduced in  the area of the chest or upper back.  Pneumothorax refers to free air around the  collapsed lung(s), inside of the thoracic cavity (chest cavity).  Another two possible  complications related to a similar event would include: Hemothorax and Chylothorax.   These are variations of the Pneumothorax, where instead of air around the collapsed  lung(s), you may have blood or chyle, respectively.       6. Spinal headaches: They may occur with any procedures in the area of the spine.       7. Persistent CSF (Cerebro-Spinal Fluid) leakage: This is a rare problem, but may occur  with prolonged intrathecal or epidural catheters either due to the formation of a fistulous  track or a dural tear.  8. Nerve damage: By working so close to the spinal cord, there is always a possibility of  nerve damage, which could be as serious as a permanent spinal cord injury with  paralysis.       9. Death:  Although rare, severe deadly allergic reactions known as "Anaphylactic  reaction" can occur to any of the medications used.      10. Worsening of the symptoms:  We can always make thing worse.  What are the chances of something like this  happening? Chances of any of this occuring are extremely low.  By statistics, you have more of a chance of getting killed in a motor vehicle accident: while driving to the hospital than any of the above occurring .  Nevertheless, you should be aware that they are possibilities.  In general, it is similar to taking a shower.  Everybody knows that you can slip, hit your head and get killed.  Does that mean that you should not shower again?  Nevertheless always keep in mind that statistics do not mean anything if you happen to be on the wrong side of them.  Even if a procedure has a 1 (one) in a 1,000,000 (million) chance of going wrong, it you happen to be that one..Also, keep in mind that by statistics, you have more of a chance of having something go wrong when taking medications.  Who should not have this procedure? If you are on a blood thinning medication (e.g. Coumadin, Plavix, see list of "Blood Thinners"), or if you have an active infection going on, you should not have the procedure.  If you are taking any blood thinners, please inform your physician.  How should I prepare for this procedure?  Do not eat or drink anything at least six hours prior to the procedure.  Bring a driver with you .  It cannot be a taxi.  Come accompanied by an adult that can drive you back, and that is strong enough to help you if your legs get weak or numb from the local anesthetic.  Take all of your medicines the morning of the procedure with just enough water to swallow them.  If you have diabetes, make sure that you are scheduled to have your procedure done first thing in the morning, whenever possible.  If you have diabetes, take only half of your insulin dose and notify our nurse that you have done so as soon as you arrive at the clinic.  If you are diabetic, but only take blood sugar pills (oral hypoglycemic), then do not take them on the morning of your procedure.  You may take them after you have had the  procedure.  Do not take aspirin or any aspirin-containing medications, at least eleven (11) days prior to the procedure.  They may prolong bleeding.  Wear loose fitting clothing that may be easy to take off and that you would not mind if it got stained with Betadine or blood.  Do not wear any jewelry or perfume  Remove any nail coloring.  It will interfere with some of our monitoring equipment.  NOTE: Remember that this is not meant to be interpreted as a complete list of all possible complications.  Unforeseen problems may occur.  BLOOD THINNERS The following drugs contain aspirin or other products, which can cause increased bleeding during surgery and should not be taken for 2 weeks prior to and 1 week after surgery.  If you should need take something for relief of minor  pain, you may take acetaminophen which is found in Tylenol,m Datril, Anacin-3 and Panadol. It is not blood thinner. The products listed below are.  Do not take any of the products listed below in addition to any listed on your instruction sheet.  A.P.C or A.P.C with Codeine Codeine Phosphate Capsules #3 Ibuprofen Ridaura  ABC compound Congesprin Imuran rimadil  Advil Cope Indocin Robaxisal  Alka-Seltzer Effervescent Pain Reliever and Antacid Coricidin or Coricidin-D  Indomethacin Rufen  Alka-Seltzer plus Cold Medicine Cosprin Ketoprofen S-A-C Tablets  Anacin Analgesic Tablets or Capsules Coumadin Korlgesic Salflex  Anacin Extra Strength Analgesic tablets or capsules CP-2 Tablets Lanoril Salicylate  Anaprox Cuprimine Capsules Levenox Salocol  Anexsia-D Dalteparin Magan Salsalate  Anodynos Darvon compound Magnesium Salicylate Sine-off  Ansaid Dasin Capsules Magsal Sodium Salicylate  Anturane Depen Capsules Marnal Soma  APF Arthritis pain formula Dewitt's Pills Measurin Stanback  Argesic Dia-Gesic Meclofenamic Sulfinpyrazone  Arthritis Bayer Timed Release Aspirin Diclofenac Meclomen Sulindac  Arthritis pain formula  Anacin Dicumarol Medipren Supac  Analgesic (Safety coated) Arthralgen Diffunasal Mefanamic Suprofen  Arthritis Strength Bufferin Dihydrocodeine Mepro Compound Suprol  Arthropan liquid Dopirydamole Methcarbomol with Aspirin Synalgos  ASA tablets/Enseals Disalcid Micrainin Tagament  Ascriptin Doan's Midol Talwin  Ascriptin A/D Dolene Mobidin Tanderil  Ascriptin Extra Strength Dolobid Moblgesic Ticlid  Ascriptin with Codeine Doloprin or Doloprin with Codeine Momentum Tolectin  Asperbuf Duoprin Mono-gesic Trendar  Aspergum Duradyne Motrin or Motrin IB Triminicin  Aspirin plain, buffered or enteric coated Durasal Myochrisine Trigesic  Aspirin Suppositories Easprin Nalfon Trillsate  Aspirin with Codeine Ecotrin Regular or Extra Strength Naprosyn Uracel  Atromid-S Efficin Naproxen Ursinus  Auranofin Capsules Elmiron Neocylate Vanquish  Axotal Emagrin Norgesic Verin  Azathioprine Empirin or Empirin with Codeine Normiflo Vitamin E  Azolid Emprazil Nuprin Voltaren  Bayer Aspirin plain, buffered or children's or timed BC Tablets or powders Encaprin Orgaran Warfarin Sodium  Buff-a-Comp Enoxaparin Orudis Zorpin  Buff-a-Comp with Codeine Equegesic Os-Cal-Gesic   Buffaprin Excedrin plain, buffered or Extra Strength Oxalid   Bufferin Arthritis Strength Feldene Oxphenbutazone   Bufferin plain or Extra Strength Feldene Capsules Oxycodone with Aspirin   Bufferin with Codeine Fenoprofen Fenoprofen Pabalate or Pabalate-SF   Buffets II Flogesic Panagesic   Buffinol plain or Extra Strength Florinal or Florinal with Codeine Panwarfarin   Buf-Tabs Flurbiprofen Penicillamine   Butalbital Compound Four-way cold tablets Penicillin   Butazolidin Fragmin Pepto-Bismol   Carbenicillin Geminisyn Percodan   Carna Arthritis Reliever Geopen Persantine   Carprofen Gold's salt Persistin   Chloramphenicol Goody's Phenylbutazone   Chloromycetin Haltrain Piroxlcam   Clmetidine heparin Plaquenil   Cllnoril Hyco-pap  Ponstel   Clofibrate Hydroxy chloroquine Propoxyphen         Before stopping any of these medications, be sure to consult the physician who ordered them.  Some, such as Coumadin (Warfarin) are ordered to prevent or treat serious conditions such as "deep thrombosis", "pumonary embolisms", and other heart problems.  The amount of time that you may need off of the medication may also vary with the medication and the reason for which you were taking it.  If you are taking any of these medications, please make sure you notify your pain physician before you undergo any procedures.

## 2014-11-05 NOTE — Progress Notes (Signed)
Subjective:    Patient ID: Briana [name], female    DOB: 01-07-1966, 49 y.o.   MRN: [id number]  HPI   Geniculate nerve blocks of the right knee   The patient is a 49 y.o. female who returns to the [facility] for further evaluation and treatment of pain involving the lumbar lower extremity region with severe pain of the right knee. Prior studies reveal patient to be with significant degenerative joint disease of the knee. The patient is severely debilitating pain which is severely aggravated with standing and walking due to severe degenerative joint disease of the knee We will proceed with geniculate nerve blocks of the right knee in an attempt to decrease severity of symptoms, minimize the risk of medication escalation, hopefully retard progression of symptoms and avoid the need for more involved treatment.  The risks benefits and expectations of the procedure were discussed with the patient. The patient was with understanding and in agreement with suggested treatment plan.  DESCRIPTION OF PROCEDURE: Geniculate nerve blocks of the right knee. The  procedure was performed with IV Versed and IV fentanyl, conscious sedation and under fluoroscopic guidance.  NEEDLE PLACEMENT FOR BLOCK OF THE LATERAL SUPERIOR GENICULATE NERVE: The patient was taking to the fluoroscopy suite. With the patient supine, with knee in flexed position, Betadine prep of proposed entry site accomplished.  IV Versed, IV fentanyl conscious sedation, EKG, blood pressure, pulse and pulse oximetry monitoring were all in place. Under fluoroscopic guidance, a 22-gauge needle was inserted in the region of the right right knee with needle placed at the lateral border of the femur at the junction of the shaft of the femur and the condyle of the femur.  Following needle placement at the lateral aspect of the knee, needle placement was then accomplished in the region of the medial aspect of the knee.  NEEDLE PLACEMENT FOR BLOCK OF  THE MEDIAL SUPERIOR GENICULATE NERVE:  Under fluoroscopic guidance, a 22 - gauge needle was inserted in the region of the right knee with needle placed at the medial border of the femur at the junction of the shaft of the femur and the condyle of the femur.   NEEDLE PLACEMENT FOR BLOCK OF THE MEDIAL INFERIOR GENICULATE NERVE:  Under fluoroscopic guidance, a 22 - gauge needle was inserted in the region of the right knee with needle placed at the junction of the shaft and plateau of the tibia.   Following needle placement on AP view of needles placed in all three locations, placement was then verified on lateral view with the tips of the superior lateral and superior medial needles documented to be one half the distance of the shaft of the femur and the tip of the inferior medial geniculate needle documented to be one half the distance of the shaft of the tibia.  Following documentation of needle placements on lateral view, each needle was injected with one mL of 0.25% bupivacaine with Kenalog. A total of 10 mg of Kenalog was utilized for the entire procedure. The patient tolerated the procedure well.    PLAN 1. Medications: Continue present medications 2. Follow-up appointment with PCP Dr.F Humphrey Rolls  for evaluation of blood pressure and general medical condition. 3. Follow-up surgical evaluation. Patient will undergo follow-up surgical evaluation as discussed  4. Follow-up neurological evaluation. May consider additional neurological studies as discussed  5. He patient may be a candidate for radiofrequency rhizolysis and other treatment pending response to treatment on today's visit and follow-up evaluation.  6. The patient is advised to adhere to proper body mechanics and avoid activities which appear to aggravate condition  Review of Systems     Objective:   Physical Exam        Assessment & Plan:

## 2014-11-05 NOTE — Progress Notes (Signed)
   Subjective:    Patient ID: Briana [name], female    DOB: 02-26-1965, 49 y.o.   MRN: [id number]  HPI    Review of Systems     Objective:   Physical Exam        Assessment & Plan:

## 2014-11-05 NOTE — Progress Notes (Signed)
   Subjective:    Patient ID: Briana [name], female    DOB: [birth year], 49 y.o.   MRN: [id number]  HPI    Review of Systems     Objective:   Physical Exam        Assessment & Plan:
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
## 2014-11-06 ENCOUNTER — Telehealth: Payer: Self-pay | Admitting: *Deleted

## 2014-11-06 NOTE — Telephone Encounter (Signed)
No answer

## 2014-11-07 ENCOUNTER — Ambulatory Visit: Payer: Medicare Other | Admitting: Physical Therapy

## 2014-11-07 ENCOUNTER — Encounter: Payer: Self-pay | Admitting: Physical Therapy

## 2014-11-07 DIAGNOSIS — M25611 Stiffness of right shoulder, not elsewhere classified: Secondary | ICD-10-CM

## 2014-11-07 DIAGNOSIS — R29898 Other symptoms and signs involving the musculoskeletal system: Secondary | ICD-10-CM

## 2014-11-07 DIAGNOSIS — M25511 Pain in right shoulder: Secondary | ICD-10-CM
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
## 2014-11-10 ENCOUNTER — Ambulatory Visit: Payer: Medicare Other | Admitting: Physical Therapy

## 2014-11-10 ENCOUNTER — Encounter: Payer: Self-pay | Admitting: Physical Therapy

## 2014-11-10 DIAGNOSIS — R29898 Other symptoms and signs involving the musculoskeletal system: Secondary | ICD-10-CM | POA: Diagnosis not present

## 2014-11-10 DIAGNOSIS — M25611 Stiffness of right shoulder, not elsewhere classified: Secondary | ICD-10-CM

## 2014-11-10 DIAGNOSIS — M25511 Pain in right shoulder: Secondary | ICD-10-CM
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
## 2014-11-20 ENCOUNTER — Ambulatory Visit: Payer: Medicare Other | Attending: Pain Medicine | Admitting: Pain Medicine

## 2014-11-20 ENCOUNTER — Encounter: Payer: Self-pay | Admitting: Pain Medicine

## 2014-11-20 VITALS — BP 104/84 | HR 90 | Temp 98.2°F | Resp 16 | Ht 61.0 in | Wt 175.0 lb

## 2014-11-20 DIAGNOSIS — M5136 Other intervertebral disc degeneration, lumbar region: Secondary | ICD-10-CM

## 2014-11-20 DIAGNOSIS — M51369 Other intervertebral disc degeneration, lumbar region without mention of lumbar back pain or lower extremity pain: Secondary | ICD-10-CM

## 2014-11-20 DIAGNOSIS — Z981 Arthrodesis status: Secondary | ICD-10-CM

## 2014-11-20 DIAGNOSIS — M47814 Spondylosis without myelopathy or radiculopathy, thoracic region: Secondary | ICD-10-CM | POA: Insufficient documentation

## 2014-11-20 DIAGNOSIS — M5481 Occipital neuralgia: Secondary | ICD-10-CM

## 2014-11-20 DIAGNOSIS — M50222 Other cervical disc displacement at C5-C6 level: Secondary | ICD-10-CM | POA: Diagnosis not present

## 2014-11-20 DIAGNOSIS — M50223 Other cervical disc displacement at C6-C7 level: Secondary | ICD-10-CM | POA: Insufficient documentation

## 2014-11-20 DIAGNOSIS — M19011 Primary osteoarthritis, right shoulder: Secondary | ICD-10-CM | POA: Diagnosis not present

## 2014-11-20 DIAGNOSIS — M5134 Other intervertebral disc degeneration, thoracic region: Secondary | ICD-10-CM | POA: Diagnosis not present

## 2014-11-20 DIAGNOSIS — M170 Bilateral primary osteoarthritis of knee: Secondary | ICD-10-CM

## 2014-11-20 DIAGNOSIS — M47812 Spondylosis without myelopathy or radiculopathy, cervical region: Secondary | ICD-10-CM | POA: Insufficient documentation

## 2014-11-20 DIAGNOSIS — M47816 Spondylosis without myelopathy or radiculopathy, lumbar region: Secondary | ICD-10-CM

## 2014-11-20 DIAGNOSIS — M19012 Primary osteoarthritis, left shoulder: Secondary | ICD-10-CM | POA: Diagnosis not present

## 2014-11-20 DIAGNOSIS — M4804 Spinal stenosis, thoracic region: Secondary | ICD-10-CM | POA: Insufficient documentation

## 2014-11-20 DIAGNOSIS — M179 Osteoarthritis of knee, unspecified: Secondary | ICD-10-CM | POA: Diagnosis not present

## 2014-11-20 DIAGNOSIS — M542 Cervicalgia: Secondary | ICD-10-CM | POA: Diagnosis present

## 2014-11-20 DIAGNOSIS — M5030 Other cervical disc degeneration, unspecified cervical region: Secondary | ICD-10-CM

## 2014-11-20 DIAGNOSIS — M545 Low back pain: Secondary | ICD-10-CM | POA: Diagnosis present

## 2014-11-20 DIAGNOSIS — Z9889 Other specified postprocedural states: Secondary | ICD-10-CM | POA: Insufficient documentation

## 2014-11-20 DIAGNOSIS — G2581 Restless legs syndrome: Secondary | ICD-10-CM

## 2014-11-20 DIAGNOSIS — M533 Sacrococcygeal disorders, not elsewhere classified: Secondary | ICD-10-CM

## 2014-11-20 MED ORDER — TRAMADOL HCL 50 MG PO TABS: ORAL_TABLET | ORAL | Status: DC

## 2014-11-20 MED ORDER — OXYCODONE HCL 5 MG PO TABS: ORAL_TABLET | ORAL | Status: DC

## 2014-11-20 MED ORDER — OXYCODONE HCL 10 MG PO TABS: ORAL_TABLET | ORAL | Status: DC

## 2014-11-20 MED ORDER — CHLORZOXAZONE 500 MG PO TABS: ORAL_TABLET | ORAL | Status: DC

## 2014-11-20 NOTE — Patient Instructions (Addendum)
PLAN   Continue present medication Parafon forte  and oxycodone No Tramadol for now Please note that oxycodone is now twice is strong and can cause respiratory depression excessive sedation confusion and other side effects  Lumbar facet, medial branch nerve, blocks to be performed at time return appointment  F/U PCP  Humphrey Rolls for evaliation of  BP and general medical  condition  F/U surgical evaluation as discussed evaluation of left shoulder and for follow-up evaluation of surgery of right shoulder  F/U neurological evaluation. May consider pending follow-up evaluations  Ask staff when MRI of the left shoulder is scheduled  May consider radiofrequency rhizolysis or intraspinal procedures pending response to present treatment and F/U evaluation   Patient to call Pain Management Center should patient have concerns prior to scheduled return appointment.  Facet Joint Block The facet joints connect the bones of the spine (vertebrae). They make it possible for you to bend, twist, and make other movements with your spine. They also prevent you from overbending, overtwisting, and making other excessive movements.  A facet joint block is a procedure where a numbing medicine (anesthetic) is injected into a facet joint. Often, a type of anti-inflammatory medicine called a steroid is also injected. A facet joint block may be done for two reasons:   Diagnosis. A facet joint block may be done as a test to see whether neck or back pain is caused by a worn-down or infected facet joint. If the pain gets better after a facet joint block, it means the pain is probably coming from the facet joint. If the pain does not get better, it means the pain is probably not coming from the facet joint.   Therapy. A facet joint block may be done to relieve neck or back pain caused by a facet joint. A facet joint block is only done as a therapy if the pain does not improve with medicine, exercise programs, physical therapy,  and other forms of pain management. LET [facility] CARE PROVIDER KNOW ABOUT:   Any allergies you have.   All medicines you are taking, including vitamins, herbs, eyedrops, and over-the-counter medicines and creams.   Previous problems you or members of your family have had with the use of anesthetics.   Any blood disorders you have had.   Other health problems you have. RISKS AND COMPLICATIONS Generally, having a facet joint block is safe. However, as with any procedure, complications can occur. Possible complications associated with having a facet joint block include:   Bleeding.   Injury to a nerve near the injection site.   Pain at the injection site.   Weakness or numbness in areas controlled by nerves near the injection site.   Infection.   Temporary fluid retention.   Allergic reaction to anesthetics or medicines used during the procedure. BEFORE THE PROCEDURE   Follow your health care provider's instructions if you are taking dietary supplements or medicines. You may need to stop taking them or reduce your dosage.   Do not take any new dietary supplements or medicines without asking your health care provider first.   Follow your health care provider's instructions about eating and drinking before the procedure. You may need to stop eating and drinking several hours before the procedure.   Arrange to have an adult drive you home after the procedure. PROCEDURE  You may need to remove your clothing and dress in an open-back gown so that your health care provider can access your spine.   The  procedure will be done while you are lying on an X-ray table. Most of the time you will be asked to lie on your stomach, but you may be asked to lie in a different position if an injection will be made in your neck.   Special machines will be used to monitor your oxygen levels, heart rate, and blood pressure.   If an injection will be made in your neck, an  intravenous (IV) tube will be inserted into one of your veins. Fluids and medicine will flow directly into your body through the IV tube.   The area over the facet joint where the injection will be made will be cleaned with an antiseptic soap. The surrounding skin will be covered with sterile drapes.   An anesthetic will be applied to your skin to make the injection area numb. You may feel a temporary stinging or burning sensation.   A video X-ray machine will be used to locate the joint. A contrast dye may be injected into the facet joint area to help with locating the joint.   When the joint is located, an anesthetic medicine will be injected into the joint through the needle.   Your health care provider will ask you whether you feel pain relief. If you do feel relief, a steroid may be injected to provide pain relief for a longer period of time. If you do not feel relief or feel only partial relief, additional injections of an anesthetic may be made in other facet joints.   The needle will be removed, the skin will be cleansed, and bandages will be applied.  AFTER THE PROCEDURE   You will be observed for 15-30 minutes before being allowed to go home. Do not drive. Have an adult drive you or take a taxi or public transportation instead.   If you feel pain relief, the pain will return in several hours or days when the anesthetic wears off.   You may feel pain relief 2-14 days after the procedure. The amount of time this relief lasts varies from person to person.   It is normal to feel some tenderness over the injected area(s) for 2 days following the procedure.   If you have diabetes, you may have a temporary increase in blood sugar.   This information is not intended to replace advice given to you by your health care provider. Make sure you discuss any questions you have with your health care provider.   Document Released: 06/08/2006 Document Revised: 02/07/2014 Document  Reviewed: 11/07/2011 Elsevier Interactive Patient Education 2016 [employer]  What are the risk, side effects and possible complications? Generally speaking, most procedures are safe.  However, with any procedure there are risks, side effects, and the possibility of complications.  The risks and complications are dependent upon the sites that are lesioned, or the type of nerve block to be performed.  The closer the procedure is to the spine, the more serious the risks are.  Great care is taken when placing the radio frequency needles, block needles or lesioning probes, but sometimes complications can occur.  Infection: Any time there is an injection through the skin, there is a risk of infection.  This is why sterile conditions are used for these blocks.  There are four possible types of infection.  Localized skin infection.  Central Nervous System Infection-This can be in the form of Meningitis, which can be deadly.  Epidural Infections-This can be in the form of an  epidural abscess, which can cause pressure inside of the spine, causing compression of the spinal cord with subsequent paralysis. This would require an emergency surgery to decompress, and there are no guarantees that the patient would recover from the paralysis.  Discitis-This is an infection of the intervertebral discs.  It occurs in about 1% of discography procedures.  It is difficult to treat and it may lead to surgery.        2. Pain: the needles have to go through skin and soft tissues, will cause soreness.       3. Damage to internal structures:  The nerves to be lesioned may be near blood vessels or    other nerves which can be potentially damaged.       4. Bleeding: Bleeding is more common if the patient is taking blood thinners such as  aspirin, Coumadin, Ticiid, Plavix, etc., or if he/she have some genetic predisposition  such as hemophilia. Bleeding into the spinal canal can cause  compression of the spinal  cord with subsequent paralysis.  This would require an emergency surgery to  decompress and there are no guarantees that the patient would recover from the  paralysis.       5. Pneumothorax:  Puncturing of a lung is a possibility, every time a needle is introduced in  the area of the chest or upper back.  Pneumothorax refers to free air around the  collapsed lung(s), inside of the thoracic cavity (chest cavity).  Another two possible  complications related to a similar event would include: Hemothorax and Chylothorax.   These are variations of the Pneumothorax, where instead of air around the collapsed  lung(s), you may have blood or chyle, respectively.       6. Spinal headaches: They may occur with any procedures in the area of the spine.       7. Persistent CSF (Cerebro-Spinal Fluid) leakage: This is a rare problem, but may occur  with prolonged intrathecal or epidural catheters either due to the formation of a fistulous  track or a dural tear.       8. Nerve damage: By working so close to the spinal cord, there is always a possibility of  nerve damage, which could be as serious as a permanent spinal cord injury with  paralysis.       9. Death:  Although rare, severe deadly allergic reactions known as "Anaphylactic  reaction" can occur to any of the medications used.      10. Worsening of the symptoms:  We can always make thing worse.  What are the chances of something like this happening? Chances of any of this occuring are extremely low.  By statistics, you have more of a chance of getting killed in a motor vehicle accident: while driving to the hospital than any of the above occurring .  Nevertheless, you should be aware that they are possibilities.  In general, it is similar to taking a shower.  Everybody knows that you can slip, hit your head and get killed.  Does that mean that you should not shower again?  Nevertheless always keep in mind that statistics do not mean  anything if you happen to be on the wrong side of them.  Even if a procedure has a 1 (one) in a 1,000,000 (million) chance of going wrong, it you happen to be that one..Also, keep in mind that by statistics, you have more of a chance of having something go wrong when taking medications.  Who should not have this procedure? If you are on a blood thinning medication (e.g. Coumadin, Plavix, see list of "Blood Thinners"), or if you have an active infection going on, you should not have the procedure.  If you are taking any blood thinners, please inform your physician.  How should I prepare for this procedure?  Do not eat or drink anything at least six hours prior to the procedure.  Bring a driver with you .  It cannot be a taxi.  Come accompanied by an adult that can drive you back, and that is strong enough to help you if your legs get weak or numb from the local anesthetic.  Take all of your medicines the morning of the procedure with just enough water to swallow them.  If you have diabetes, make sure that you are scheduled to have your procedure done first thing in the morning, whenever possible.  If you have diabetes, take only half of your insulin dose and notify our nurse that you have done so as soon as you arrive at the clinic.  If you are diabetic, but only take blood sugar pills (oral hypoglycemic), then do not take them on the morning of your procedure.  You may take them after you have had the procedure.  Do not take aspirin or any aspirin-containing medications, at least eleven (11) days prior to the procedure.  They may prolong bleeding.  Wear loose fitting clothing that may be easy to take off and that you would not mind if it got stained with Betadine or blood.  Do not wear any jewelry or perfume  Remove any nail coloring.  It will interfere with some of our monitoring equipment.  NOTE: Remember that this is not meant to be interpreted as a complete list of all possible  complications.  Unforeseen problems may occur.  BLOOD THINNERS The following drugs contain aspirin or other products, which can cause increased bleeding during surgery and should not be taken for 2 weeks prior to and 1 week after surgery.  If you should need take something for relief of minor pain, you may take acetaminophen which is found in Tylenol,m Datril, Anacin-3 and Panadol. It is not blood thinner. The products listed below are.  Do not take any of the products listed below in addition to any listed on your instruction sheet.  A.P.C or A.P.C with Codeine Codeine Phosphate Capsules #3 Ibuprofen Ridaura  ABC compound Congesprin Imuran rimadil  Advil Cope Indocin Robaxisal  Alka-Seltzer Effervescent Pain Reliever and Antacid Coricidin or Coricidin-D  Indomethacin Rufen  Alka-Seltzer plus Cold Medicine Cosprin Ketoprofen S-A-C Tablets  Anacin Analgesic Tablets or Capsules Coumadin Korlgesic Salflex  Anacin Extra Strength Analgesic tablets or capsules CP-2 Tablets Lanoril Salicylate  Anaprox Cuprimine Capsules Levenox Salocol  Anexsia-D Dalteparin Magan Salsalate  Anodynos Darvon compound Magnesium Salicylate Sine-off  Ansaid Dasin Capsules Magsal Sodium Salicylate  Anturane Depen Capsules Marnal Soma  APF Arthritis pain formula Dewitt's Pills Measurin Stanback  Argesic Dia-Gesic Meclofenamic Sulfinpyrazone  Arthritis Bayer Timed Release Aspirin Diclofenac Meclomen Sulindac  Arthritis pain formula Anacin Dicumarol Medipren Supac  Analgesic (Safety coated) Arthralgen Diffunasal Mefanamic Suprofen  Arthritis Strength Bufferin Dihydrocodeine Mepro Compound Suprol  Arthropan liquid Dopirydamole Methcarbomol with Aspirin Synalgos  ASA tablets/Enseals Disalcid Micrainin Tagament  Ascriptin Doan's Midol Talwin  Ascriptin A/D Dolene Mobidin Tanderil  Ascriptin Extra Strength Dolobid Moblgesic Ticlid  Ascriptin with Codeine Doloprin or Doloprin with Codeine Momentum Tolectin  Asperbuf Duoprin  Mono-gesic Trendar  Aspergum Duradyne  Motrin or Motrin IB Triminicin  Aspirin plain, buffered or enteric coated Durasal Myochrisine Trigesic  Aspirin Suppositories Easprin Nalfon Trillsate  Aspirin with Codeine Ecotrin Regular or Extra Strength Naprosyn Uracel  Atromid-S Efficin Naproxen Ursinus  Auranofin Capsules Elmiron Neocylate Vanquish  Axotal Emagrin Norgesic Verin  Azathioprine Empirin or Empirin with Codeine Normiflo Vitamin E  Azolid Emprazil Nuprin Voltaren  Bayer Aspirin plain, buffered or children's or timed BC Tablets or powders Encaprin Orgaran Warfarin Sodium  Buff-a-Comp Enoxaparin Orudis Zorpin  Buff-a-Comp with Codeine Equegesic Os-Cal-Gesic   Buffaprin Excedrin plain, buffered or Extra Strength Oxalid   Bufferin Arthritis Strength Feldene Oxphenbutazone   Bufferin plain or Extra Strength Feldene Capsules Oxycodone with Aspirin   Bufferin with Codeine Fenoprofen Fenoprofen Pabalate or Pabalate-SF   Buffets II Flogesic Panagesic   Buffinol plain or Extra Strength Florinal or Florinal with Codeine Panwarfarin   Buf-Tabs Flurbiprofen Penicillamine   Butalbital Compound Four-way cold tablets Penicillin   Butazolidin Fragmin Pepto-Bismol   Carbenicillin Geminisyn Percodan   Carna Arthritis Reliever Geopen Persantine   Carprofen Gold's salt Persistin   Chloramphenicol Goody's Phenylbutazone   Chloromycetin Haltrain Piroxlcam   Clmetidine heparin Plaquenil   Cllnoril Hyco-pap Ponstel   Clofibrate Hydroxy chloroquine Propoxyphen         Before stopping any of these medications, be sure to consult the physician who ordered them.  Some, such as Coumadin (Warfarin) are ordered to prevent or treat serious conditions such as "deep thrombosis", "pumonary embolisms", and other heart problems.  The amount of time that you may need off of the medication may also vary with the medication and the reason for which you were taking it.  If you are taking any of these medications, please  make sure you notify your pain physician before you undergo any procedures.

## 2014-11-20 NOTE — Progress Notes (Signed)
Subjective:    Patient ID: Briana [name], female    DOB: 1965/02/27, 49 y.o.   MRN: [id number]  HPI  Patient is 49 year old female who returns to pain management Center for further evaluation and treatment of pain involving the neck upper extremity regions mid lower back and lower extremity region. The patient is status post surgical intervention of the right shoulder. The patient is with severe pain of the left shoulder. We discussed patient undergoing follow-up evaluation and will have patient return to surgeon for further evaluation of the left shoulder. We will obtain MRI of the left shoulder. We will also adjust medication and increase the dose of the oxycodone and eliminate Ultram from patient's regimen. The patient will continue Parafon forte. Cautioned patient regarding respiratory depression confusion and excessive sedation which can occur with her medications. The patient expressed understanding and will exercise all precautions to avoid excessive sedation. We will proceed with MRI of the left shoulder and remain available to consider patient for additional modification of treatment pending follow-up evaluation. Patient was with significant lumbar lower extremity pain. We discussed patient's overall condition and proceed with lumbar facet, medial branch nerve blocks to be performed at time return appointment in attempt to decrease severely disabling pain of the lumbar region and prevent progression of patient's symptoms. The patient was with understanding and in agreement with suggested treatment plan.  Review of Systems     Objective:   Physical Exam  Physical examination revealed patient to be with tends to palpation of paraspinal muscles and cervical and cervical facet region moderate degree. Moderate muscle spasms noted in the paraspinal muscular cervical region. There was moderate tends to palpation of the acromioclavicular and glenohumeral joint region and moderate moderately severe  discomfort of the left shoulder. Patient was significantly limited range of motion of the left shoulder. Patient was with decreased grip strength of the upper extremities with Tinel and Phalen's maneuver without increased pain of significant degree. Moderate muscle spasms were noted in the cervical and thoracic paraspinal musculature region with no crepitus of the thoracic region noted. Palpation over the lumbar paraspinal muscles lumbar facet region associated with moderate discomfort. Lateral bending and rotation and extension to palpation lumbar facets reproduce moderate discomfort. Straight leg raising was tolerates approximately 20 without increased pain with dorsiflexion noted. There was negative clonus negative Homans. There was mild tenderness of the greater trochanteric region and iliotibial band region. DTRs appear to be treated the knees definite sensory deficit of dermatomal distribution lower extremities noted. Lumbar lower extremity pain was reproduced with palpation over the lumbar facet lumbar paraspinal muscular region with extension and palpation lumbar facets reproducing severe discomfort. Abdomen was nontender with no costovertebral maintenance noted.      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 Please note oxycodone was increased from  5 mg to a 10 mg size pill and patient was cautioned regarding respiratory depression confusion and other side effects which can occur with her medications. The patient was also instructed to avoid taking Ultram  Lumbar facet, medial branch nerve blocks to be performed at time return appointment  F/U PCP  Dr. Yancey Flemings for evaliation of  BP and general medical  condition  F/U surgical evaluation  With Dr.Krasinski as planned and as discussed  F/U neurological evaluation. May consider pending follow-up evaluations  MRI of the left shoulder to evaluate for tears, degenerative changes of abnormalities which may be contributing to patient's symptoms  May consider radiofrequency rhizolysis or intraspinal procedures pending response to present treatment and F/U evaluation   Patient to call Pain Management Center should patient have concerns prior to scheduled return appointment.

## 2014-11-20 NOTE — Progress Notes (Signed)
Safety precautions to be maintained throughout the outpatient stay will include: orient to surroundings, keep bed in low position, maintain call bell within reach at all times, provide assistance with transfer out of bed and ambulation.  

## 2014-12-01 ENCOUNTER — Ambulatory Visit: Payer: Medicare Other | Attending: Pain Medicine | Admitting: Pain Medicine

## 2014-12-01 ENCOUNTER — Encounter: Payer: Self-pay | Admitting: Pain Medicine

## 2014-12-01 VITALS — BP 92/69 | HR 87 | Temp 96.1°F | Resp 18 | Ht 61.0 in | Wt 180.0 lb

## 2014-12-01 DIAGNOSIS — G2581 Restless legs syndrome: Secondary | ICD-10-CM

## 2014-12-01 DIAGNOSIS — M170 Bilateral primary osteoarthritis of knee: Secondary | ICD-10-CM

## 2014-12-01 DIAGNOSIS — Z981 Arthrodesis status: Secondary | ICD-10-CM

## 2014-12-01 DIAGNOSIS — M19012 Primary osteoarthritis, left shoulder: Secondary | ICD-10-CM

## 2014-12-01 DIAGNOSIS — M5030 Other cervical disc degeneration, unspecified cervical region: Secondary | ICD-10-CM

## 2014-12-01 DIAGNOSIS — M533 Sacrococcygeal disorders, not elsewhere classified: Secondary | ICD-10-CM

## 2014-12-01 DIAGNOSIS — M5481 Occipital neuralgia: Secondary | ICD-10-CM

## 2014-12-01 DIAGNOSIS — M545 Low back pain: Secondary | ICD-10-CM | POA: Insufficient documentation

## 2014-12-01 DIAGNOSIS — M5134 Other intervertebral disc degeneration, thoracic region: Secondary | ICD-10-CM

## 2014-12-01 DIAGNOSIS — M5136 Other intervertebral disc degeneration, lumbar region: Secondary | ICD-10-CM

## 2014-12-01 DIAGNOSIS — M51369 Other intervertebral disc degeneration, lumbar region without mention of lumbar back pain or lower extremity pain: Secondary | ICD-10-CM

## 2014-12-01 DIAGNOSIS — M19011 Primary osteoarthritis, right shoulder: Secondary | ICD-10-CM

## 2014-12-01 DIAGNOSIS — M5137 Other intervertebral disc degeneration, lumbosacral region: Secondary | ICD-10-CM | POA: Diagnosis not present

## 2014-12-01 DIAGNOSIS — M47816 Spondylosis without myelopathy or radiculopathy, lumbar region: Secondary | ICD-10-CM

## 2014-12-01 MED ORDER — CIPROFLOXACIN HCL 250 MG PO TABS: 250.0000 mg | ORAL_TABLET | Freq: Two times a day (BID) | ORAL | Status: DC

## 2014-12-01 MED ORDER — LACTATED RINGERS IV SOLN: 1000.0000 mL | INTRAVENOUS | Status: DC

## 2014-12-01 MED ORDER — ORPHENADRINE CITRATE 30 MG/ML IJ SOLN: 60.0000 mg | Freq: Once | INTRAMUSCULAR | Status: DC

## 2014-12-01 MED ORDER — TRIAMCINOLONE ACETONIDE 40 MG/ML IJ SUSP
INTRAMUSCULAR | Status: AC
  Administered 2014-12-01: 14:00:00
  Filled 2014-12-01: qty 1

## 2014-12-01 MED ORDER — ORPHENADRINE CITRATE 30 MG/ML IJ SOLN
INTRAMUSCULAR | Status: AC
  Administered 2014-12-01: 14:00:00
  Filled 2014-12-01: qty 2

## 2014-12-01 MED ORDER — CIPROFLOXACIN IN D5W 400 MG/200ML IV SOLN: 400.0000 mg | Freq: Once | INTRAVENOUS | Status: DC

## 2014-12-01 MED ORDER — FENTANYL CITRATE (PF) 100 MCG/2ML IJ SOLN
INTRAMUSCULAR | Status: AC
  Administered 2014-12-01: 100 ug via INTRAVENOUS
  Filled 2014-12-01: qty 2

## 2014-12-01 MED ORDER — CIPROFLOXACIN IN D5W 400 MG/200ML IV SOLN
INTRAVENOUS | Status: AC
  Administered 2014-12-01: 400 mg via INTRAVENOUS
  Filled 2014-12-01: qty 200

## 2014-12-01 MED ORDER — BUPIVACAINE HCL (PF) 0.25 % IJ SOLN
INTRAMUSCULAR | Status: AC
  Administered 2014-12-01: 14:00:00
  Filled 2014-12-01: qty 30

## 2014-12-01 MED ORDER — TRIAMCINOLONE ACETONIDE 40 MG/ML IJ SUSP: 40.0000 mg | Freq: Once | INTRAMUSCULAR | Status: DC

## 2014-12-01 MED ORDER — MIDAZOLAM HCL 5 MG/5ML IJ SOLN
INTRAMUSCULAR | Status: AC
  Administered 2014-12-01: 5 mg via INTRAVENOUS
  Filled 2014-12-01: qty 5

## 2014-12-01 MED ORDER — MIDAZOLAM HCL 5 MG/5ML IJ SOLN: 5.0000 mg | Freq: Once | INTRAMUSCULAR | Status: DC

## 2014-12-01 MED ORDER — FENTANYL CITRATE (PF) 100 MCG/2ML IJ SOLN: 100.0000 ug | Freq: Once | INTRAMUSCULAR | Status: DC

## 2014-12-01 MED ORDER — BUPIVACAINE HCL (PF) 0.25 % IJ SOLN: 30.0000 mL | Freq: Once | INTRAMUSCULAR | Status: DC

## 2014-12-01 NOTE — Patient Instructions (Addendum)
PLAN   Continue present medication Parafon forte  and oxycodone No Tramadol Please obtain antibiotic Cipro and began taking Cipro today as prescribed  F/U PCP  Humphrey Rolls for evaliation of  BP and general medical  Condition  Appointment for evaluation of shoulder with Dr.Krasinski as scheduled  Ask staff the date of your MRI of the left shoulder   F/U surgical evaluation as discussed evaluation of left shoulder and for follow-up evaluation of surgery of right shoulder  F/U neurological evaluation. May consider pending follow-up evaluations  Ask staff when MRI of the left shoulder is scheduled  May consider radiofrequency rhizolysis or intraspinal procedures pending response to present treatment and F/U evaluation   Pain Management Discharge Instructions  General Discharge Instructions :  If you need to reach your doctor call: Monday-Friday 8:00 am - 4:00 pm at [phone] or toll free [phone].  After clinic hours [phone] to have operator reach doctor.  Bring all of your medication bottles to all your appointments in the pain clinic.  To cancel or reschedule your appointment with Pain Management please remember to call 24 hours in advance to avoid a fee.  Refer to the educational materials which you have been given on: General Risks, I had my Procedure. Discharge Instructions, Post Sedation.  Post Procedure Instructions:  The drugs you were given will stay in your system until tomorrow, so for the next 24 hours you should not drive, make any legal decisions or drink any alcoholic beverages.  You may eat anything you prefer, but it is better to start with liquids then soups and crackers, and gradually work up to solid foods.  Please notify your doctor immediately if you have any unusual bleeding, trouble breathing or pain that is not related to your normal pain.  Depending on the type of procedure that was done, some parts of your body may feel week and/or numb.  This  usually clears up by tonight or the next day.  Walk with the use of an assistive device or accompanied by an adult for the 24 hours.  You may use ice on the affected area for the first 24 hours.  Put ice in a Ziploc bag and cover with a towel and place against area 15 minutes on 15 minutes off.  You may switch to heat after 24 hours.  A prescription for CIPRO was sent to your pharmacy and should be available for pickup today.

## 2014-12-01 NOTE — Progress Notes (Signed)
Subjective:    Patient ID: [name], female    DOB: 05-07-1965, 49 y.o.   MRN: [id number]  HPI  PROCEDURE PERFORMED: Lumbar facet (medial branch block)   NOTE: The patient is a 49 y.o. female who returns to [facility] for further evaluation and treatment of pain involving the lumbar and lower extremity region. MRI  revealed the patient to be with evidence of Degenerative disc disease lumbar spine T12-L1, L1-2, L2-3,, L3-4, L4-5, L5-S1 with paracentral disc bulging partial effacement of the anterior cerebrospinal fluid space. The risks, benefits, and expectations of the procedure have been discussed and explained to the patient who was understanding and in agreement with suggested treatment plan. We will proceed with interventional treatment as discussed and as explained to the patient who was understanding and wished to proceed with procedure as planned.   DESCRIPTION OF PROCEDURE: Lumbar facet (medial branch block) with IV Versed, IV fentanyl conscious sedation, EKG, blood pressure, pulse, and pulse oximetry monitoring. The procedure was performed with the patient in the prone position. Betadine prep of proposed entry site performed.   NEEDLE PLACEMENT AT: left L 3 lumbar facet (medial branch block). Under fluoroscopic guidance with oblique orientation of 15 degrees, a 22-gauge needle was inserted at the L 3 vertebral body level with needle placed at the targeted area of Burton's Eye or Eye of the Scotty Dog with documentation of needle placement in the superior and lateral border of targeted area of Burton's Eye or Eye of the Scotty Dog with oblique orientation of 15 degrees. Following documentation of needle placement at the L 3 vertebral body level, needle placement was then accomplished at the L 4 vertebral body level.   NEEDLE PLACEMENT AT L4 and L5 VERTEBRAL BODY LEVELS ON THE LEFT SIDE The procedure was performed at the l4 and L5  vertebral body levels exactly as was  performed at the L3 vertebral body level utilizing the same technique and under fluoroscopic guidance.  NEEDLE PLACEMENT AT THE SACRAL ALA with AP view of the lumbosacral spine. With the patient in the prone position, Betadine prep of proposed entry site accomplished, a 22 gauge needle was inserted in the region of the sacral ala (groove formed by the superior articulating process of S1 and the sacral wing). Following documentation of needle placement at the sacral ala,  needle placement was then accomplished at the S1 foramen level.   NEEDLE PLACEMENT AT THE S1 FORAMEN LEVEL under fluoroscopic guidance with AP view of the lumbosacral spine and cephalad orientation of the fluoroscope, a 22-gauge needle was placed at the superior and lateral border of the S1 foramen under fluoroscopic guidance. Following documentation of needle placement at the S1 foramen.   Needle placement was then verified at all levels on lateral view. Following documentation of needle placement at all levels on lateral view and following negative aspiration for heme and CSF, each level was injected with 1 mL of 0.25% bupivacaine with Kenalog.     LUMBAR FACET, MEDIAL BRANCH NERVE, BLOCKS PERFORMED ON THE RIGHT SIDE   The procedure was performed on the right side exactly as was performed on the left side at the same levels and utilizing the same technique under fluoroscopic guidance.     The patient tolerated the procedure well. A total of 40 mg of Kenalog was utilized for the procedure.   PLAN:  1. Medications: The patient will continue presently prescribed medications. 2. May consider modification of treatment regimen at time of return  appointment pending response to treatment rendered on today's visit. 3. The patient is to follow-up with primary care physician Dr Stephanie Coup for further evaluation of blood pressure and general medical condition status post steroid injection performed on today's visit. 4. Surgical follow-up  evaluation.MRI of shoulder ordered again today Neurological follow-up evaluation. 5. The patient may be candidate for radiofrequency procedures, implantation type procedures, and other treatment pending response to treatment and follow-up evaluation. 6. The patient has been advised to call the [facility] Management [facility] prior to scheduled return appointment should there be significant change in condition or should patient have other concerns regarding condition prior to scheduled return appointment.  The patient is understanding and in agreement with suggested treatment plan.   Review of Systems     Objective:   Physical Exam        Assessment & Plan:

## 2014-12-01 NOTE — Progress Notes (Signed)
Safety precautions to be maintained throughout the outpatient stay will include: orient to surroundings, keep bed in low position, maintain call bell within reach at all times, provide assistance with transfer out of bed and ambulation.  

## 2014-12-02 ENCOUNTER — Telehealth: Payer: Self-pay | Admitting: *Deleted

## 2014-12-02 NOTE — Telephone Encounter (Signed)
Left voicemail to call our office if she has any problems, questions or concerns following procedure on yesterday.

## 2014-12-03 ENCOUNTER — Telehealth: Payer: Self-pay | Admitting: Pain Medicine

## 2014-12-03 NOTE — Telephone Encounter (Signed)
Nurses Please call and Valium 5 mg by mouth 1 hour prior to MRI                           Valium 5 mg by mouth 1 hour at beginning of MRI                           Valium 5 mg by mouth one hour later if needed                            Quantity of 3 pills

## 2014-12-03 NOTE — Telephone Encounter (Signed)
Has appt on Friday for MRI / is claustrophobic and needs something called in to [facility] on [address]

## 2014-12-04 ENCOUNTER — Telehealth: Payer: Self-pay | Admitting: *Deleted

## 2014-12-04 NOTE — Telephone Encounter (Signed)
Rx called to [facility].  Patient aware.

## 2014-12-04 NOTE — Telephone Encounter (Signed)
[facility] called to confirm sig for Valium for patient to use during MRI procedure.  Clarification per Dr Primus Bravo to take 5 mg 1 hour prior, 5 mg at the beginning and 5 mg, if needed, s/p 1 hour after procedure.

## 2014-12-05 ENCOUNTER — Ambulatory Visit
Admission: RE | Admit: 2014-12-05 | Discharge: 2014-12-05 | Disposition: A | Discharge status: Home / Self Care | Payer: Medicare Other | Source: Ambulatory Visit | Attending: Pain Medicine | Admitting: Pain Medicine

## 2014-12-05 DIAGNOSIS — M75102 Unspecified rotator cuff tear or rupture of left shoulder, not specified as traumatic: Secondary | ICD-10-CM | POA: Diagnosis not present

## 2014-12-05 DIAGNOSIS — M47816 Spondylosis without myelopathy or radiculopathy, lumbar region: Secondary | ICD-10-CM

## 2014-12-05 DIAGNOSIS — Z981 Arthrodesis status: Secondary | ICD-10-CM | POA: Diagnosis not present

## 2014-12-05 DIAGNOSIS — M5481 Occipital neuralgia: Secondary | ICD-10-CM | POA: Diagnosis not present

## 2014-12-05 DIAGNOSIS — M19011 Primary osteoarthritis, right shoulder: Secondary | ICD-10-CM | POA: Diagnosis not present

## 2014-12-05 DIAGNOSIS — M67814 Other specified disorders of tendon, left shoulder: Secondary | ICD-10-CM | POA: Diagnosis not present

## 2014-12-05 DIAGNOSIS — G2581 Restless legs syndrome: Secondary | ICD-10-CM

## 2014-12-05 DIAGNOSIS — M5136 Other intervertebral disc degeneration, lumbar region: Secondary | ICD-10-CM | POA: Insufficient documentation

## 2014-12-05 DIAGNOSIS — M5134 Other intervertebral disc degeneration, thoracic region: Secondary | ICD-10-CM

## 2014-12-05 DIAGNOSIS — M170 Bilateral primary osteoarthritis of knee: Secondary | ICD-10-CM

## 2014-12-05 DIAGNOSIS — M533 Sacrococcygeal disorders, not elsewhere classified: Secondary | ICD-10-CM | POA: Diagnosis not present

## 2014-12-05 DIAGNOSIS — Z9889 Other specified postprocedural states: Secondary | ICD-10-CM | POA: Diagnosis present

## 2014-12-05 DIAGNOSIS — M51369 Other intervertebral disc degeneration, lumbar region without mention of lumbar back pain or lower extremity pain: Secondary | ICD-10-CM

## 2014-12-05 DIAGNOSIS — M19012 Primary osteoarthritis, left shoulder: Secondary | ICD-10-CM

## 2014-12-05 DIAGNOSIS — M5030 Other cervical disc degeneration, unspecified cervical region: Secondary | ICD-10-CM | POA: Diagnosis not present

## 2014-12-05 DIAGNOSIS — M25512 Pain in left shoulder: Secondary | ICD-10-CM | POA: Diagnosis present

## 2014-12-09 ENCOUNTER — Telehealth: Payer: Self-pay | Admitting: *Deleted

## 2014-12-09 NOTE — Progress Notes (Signed)
Patient has appt on November 16 and this is suitable for her and Dr Primus Bravo.

## 2014-12-11 ENCOUNTER — Ambulatory Visit: Payer: Medicare Other | Attending: Pain Medicine | Admitting: Pain Medicine

## 2014-12-11 ENCOUNTER — Encounter: Payer: Self-pay | Admitting: Pain Medicine

## 2014-12-11 VITALS — BP 131/87 | HR 95 | Temp 98.7°F | Resp 16 | Ht 61.0 in | Wt 180.0 lb

## 2014-12-11 DIAGNOSIS — M47812 Spondylosis without myelopathy or radiculopathy, cervical region: Secondary | ICD-10-CM | POA: Diagnosis not present

## 2014-12-11 DIAGNOSIS — M19011 Primary osteoarthritis, right shoulder: Secondary | ICD-10-CM | POA: Diagnosis not present

## 2014-12-11 DIAGNOSIS — M50222 Other cervical disc displacement at C5-C6 level: Secondary | ICD-10-CM | POA: Insufficient documentation

## 2014-12-11 DIAGNOSIS — M2578 Osteophyte, vertebrae: Secondary | ICD-10-CM | POA: Diagnosis not present

## 2014-12-11 DIAGNOSIS — M47814 Spondylosis without myelopathy or radiculopathy, thoracic region: Secondary | ICD-10-CM | POA: Insufficient documentation

## 2014-12-11 DIAGNOSIS — M19012 Primary osteoarthritis, left shoulder: Secondary | ICD-10-CM | POA: Diagnosis not present

## 2014-12-11 DIAGNOSIS — M47816 Spondylosis without myelopathy or radiculopathy, lumbar region: Secondary | ICD-10-CM

## 2014-12-11 DIAGNOSIS — G2581 Restless legs syndrome: Secondary | ICD-10-CM

## 2014-12-11 DIAGNOSIS — Z981 Arthrodesis status: Secondary | ICD-10-CM

## 2014-12-11 DIAGNOSIS — M5136 Other intervertebral disc degeneration, lumbar region: Secondary | ICD-10-CM | POA: Insufficient documentation

## 2014-12-11 DIAGNOSIS — M5126 Other intervertebral disc displacement, lumbar region: Secondary | ICD-10-CM | POA: Diagnosis not present

## 2014-12-11 DIAGNOSIS — M4804 Spinal stenosis, thoracic region: Secondary | ICD-10-CM | POA: Insufficient documentation

## 2014-12-11 DIAGNOSIS — M5134 Other intervertebral disc degeneration, thoracic region: Secondary | ICD-10-CM | POA: Insufficient documentation

## 2014-12-11 DIAGNOSIS — M5481 Occipital neuralgia: Secondary | ICD-10-CM

## 2014-12-11 DIAGNOSIS — M542 Cervicalgia: Secondary | ICD-10-CM | POA: Diagnosis present

## 2014-12-11 DIAGNOSIS — M170 Bilateral primary osteoarthritis of knee: Secondary | ICD-10-CM

## 2014-12-11 DIAGNOSIS — M546 Pain in thoracic spine: Secondary | ICD-10-CM | POA: Diagnosis present

## 2014-12-11 DIAGNOSIS — M5030 Other cervical disc degeneration, unspecified cervical region: Secondary | ICD-10-CM

## 2014-12-11 DIAGNOSIS — Z9889 Other specified postprocedural states: Secondary | ICD-10-CM | POA: Diagnosis not present

## 2014-12-11 DIAGNOSIS — M533 Sacrococcygeal disorders, not elsewhere classified: Secondary | ICD-10-CM

## 2014-12-11 MED ORDER — OXYCODONE HCL 10 MG PO TABS: ORAL_TABLET | ORAL | Status: DC

## 2014-12-11 MED ORDER — CHLORZOXAZONE 500 MG PO TABS: ORAL_TABLET | ORAL | Status: DC

## 2014-12-11 NOTE — Progress Notes (Signed)
Safety precautions to be maintained throughout the outpatient stay will include: orient to surroundings, keep bed in low position, maintain call bell within reach at all times, provide assistance with transfer out of bed and ambulation.  

## 2014-12-11 NOTE — Progress Notes (Signed)
Subjective:    Patient ID: Briana [name], female    DOB: [birth year], 49 y.o.   MRN: [id number]  HPI  The patient is a 49 year old female who returns to pain management for further evaluation and treatment of pain involving the region of the neck and upper extremity regions lower back and lower extremity region as well as headaches. The patient is with surgery of the right shoulder and has MRI evidence tear of the left shoulder. Patient will undergo further evaluation with Dr. Mack Guise as discussed. The patient is with improvement of lower back virtually pain status post lumbar facet, medial branch nerve blocks. We will continue present medications and will await surgical disposition of Dr. Mack Guise . The patient was understanding and agreement to suggested treatment plan.    Review of Systems     Objective:   Physical Exam  There was tenderness to palpation of the splenius capitis of the talus musculature regions. Palpation of these regions reproduced pain of moderate degree. There was unremarkable Spurling's maneuver There was tenderness to palpation of the acromioclavicular and glenohumeral joint regions. Patient was with limited range of motion of the upper extremities. And was unable to perform drop test. Palpation over the region of the cervical facet cervical paraspinal musculature regions reproduces moderate discomfort. There was moderate since to palpation over the thoracic facet thoracic paraspinal musculature region. There was no crepitus of the thoracic region noted.. Palpation over the lumbar paraspinal muscles and lumbar facet region associated with moderate discomfort. Lateral bending and rotation reproduce mild to moderate discomfort. Patient tolerated to 30 without increase of pain with dorsiflexion noted. DTRs were trace at the knees. There is mild tenderness to palpation of the PSIS  and PIIIS regions. There was no significant sensory deficit or dermatomal distribution detected.  DTRs appeared to be trace at the knees. There was crepitus of the knees with increased pain with range of motion maneuvers of the knees. There is negative anterior and posterior drawer signs. There was negative clonus negative Homans. Abdomen nontender with no costovertebral tenderness noted.         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 Moderate tendinosis of the infraspinatus tendon without a discrete tear. Moderate tendinosis of the intra-articular portion of the long head of the biceps tendon.    Plan   Continue present medication Parafon forte and oxycodone.  F/U PCP  Dr.F Humphrey Rolls for evaliation of  BP and general medical  condition  F/U surgical evaluation. F/U  With Dr.Kransinski as planned  F/U Dr. Trenton Gammon as discussed  F/U neurological evaluation. May consider pending follow-up evaluations  May consider radiofrequency rhizolysis or intraspinal procedures pending response to present treatment and F/U evaluation   Patient to call Pain Management Center should patient have concerns prior to  scheduled return appointment.

## 2014-12-16 ENCOUNTER — Other Ambulatory Visit: Payer: Self-pay | Admitting: Nurse Practitioner

## 2014-12-16 DIAGNOSIS — Z1231 Encounter for screening mammogram for malignant neoplasm of breast: Secondary | ICD-10-CM

## 2014-12-17 ENCOUNTER — Ambulatory Visit: Payer: Medicare Other | Admitting: Pain Medicine

## 2014-12-30 ENCOUNTER — Ambulatory Visit
Admission: RE | Admit: 2014-12-30 | Discharge: 2014-12-30 | Disposition: A | Discharge status: Home / Self Care | Payer: Medicare Other | Source: Ambulatory Visit | Attending: Nurse Practitioner | Admitting: Nurse Practitioner

## 2014-12-30 DIAGNOSIS — Z1231 Encounter for screening mammogram for malignant neoplasm of breast: Secondary | ICD-10-CM | POA: Diagnosis not present

## 2014-12-31 ENCOUNTER — Other Ambulatory Visit: Payer: Self-pay | Admitting: Nurse Practitioner

## 2014-12-31 DIAGNOSIS — N63 Unspecified lump in unspecified breast: Secondary | ICD-10-CM

## 2015-01-06 ENCOUNTER — Other Ambulatory Visit: Payer: Self-pay | Admitting: Pain Medicine

## 2015-01-07 ENCOUNTER — Ambulatory Visit
Admission: RE | Admit: 2015-01-07 | Discharge: 2015-01-07 | Disposition: A | Discharge status: Home / Self Care | Payer: Medicare Other | Source: Ambulatory Visit | Attending: Nurse Practitioner | Admitting: Nurse Practitioner

## 2015-01-07 DIAGNOSIS — N63 Unspecified lump in unspecified breast: Secondary | ICD-10-CM

## 2015-01-08 ENCOUNTER — Ambulatory Visit: Payer: Medicare Other | Attending: Pain Medicine | Admitting: Pain Medicine

## 2015-01-08 ENCOUNTER — Encounter: Payer: Self-pay | Admitting: Pain Medicine

## 2015-01-08 VITALS — BP 120/79 | HR 84 | Temp 98.2°F | Resp 16 | Ht 61.0 in | Wt 180.0 lb

## 2015-01-08 DIAGNOSIS — M5030 Other cervical disc degeneration, unspecified cervical region: Secondary | ICD-10-CM | POA: Diagnosis not present

## 2015-01-08 DIAGNOSIS — M546 Pain in thoracic spine: Secondary | ICD-10-CM | POA: Diagnosis present

## 2015-01-08 DIAGNOSIS — Z9889 Other specified postprocedural states: Secondary | ICD-10-CM | POA: Insufficient documentation

## 2015-01-08 DIAGNOSIS — M50222 Other cervical disc displacement at C5-C6 level: Secondary | ICD-10-CM | POA: Diagnosis not present

## 2015-01-08 DIAGNOSIS — M170 Bilateral primary osteoarthritis of knee: Secondary | ICD-10-CM

## 2015-01-08 DIAGNOSIS — M19011 Primary osteoarthritis, right shoulder: Secondary | ICD-10-CM | POA: Diagnosis not present

## 2015-01-08 DIAGNOSIS — M4804 Spinal stenosis, thoracic region: Secondary | ICD-10-CM | POA: Insufficient documentation

## 2015-01-08 DIAGNOSIS — M51369 Other intervertebral disc degeneration, lumbar region without mention of lumbar back pain or lower extremity pain: Secondary | ICD-10-CM

## 2015-01-08 DIAGNOSIS — M2578 Osteophyte, vertebrae: Secondary | ICD-10-CM | POA: Diagnosis not present

## 2015-01-08 DIAGNOSIS — M19012 Primary osteoarthritis, left shoulder: Secondary | ICD-10-CM | POA: Diagnosis not present

## 2015-01-08 DIAGNOSIS — M5126 Other intervertebral disc displacement, lumbar region: Secondary | ICD-10-CM | POA: Diagnosis not present

## 2015-01-08 DIAGNOSIS — M5023 Other cervical disc displacement, cervicothoracic region: Secondary | ICD-10-CM | POA: Insufficient documentation

## 2015-01-08 DIAGNOSIS — M5136 Other intervertebral disc degeneration, lumbar region: Secondary | ICD-10-CM | POA: Diagnosis not present

## 2015-01-08 DIAGNOSIS — M5481 Occipital neuralgia: Secondary | ICD-10-CM

## 2015-01-08 DIAGNOSIS — M533 Sacrococcygeal disorders, not elsewhere classified: Secondary | ICD-10-CM

## 2015-01-08 DIAGNOSIS — G2581 Restless legs syndrome: Secondary | ICD-10-CM

## 2015-01-08 DIAGNOSIS — M542 Cervicalgia: Secondary | ICD-10-CM | POA: Diagnosis present

## 2015-01-08 DIAGNOSIS — Z981 Arthrodesis status: Secondary | ICD-10-CM

## 2015-01-08 DIAGNOSIS — M5134 Other intervertebral disc degeneration, thoracic region: Secondary | ICD-10-CM

## 2015-01-08 DIAGNOSIS — M47816 Spondylosis without myelopathy or radiculopathy, lumbar region: Secondary | ICD-10-CM

## 2015-01-08 MED ORDER — OXYCODONE HCL 10 MG PO TABS: ORAL_TABLET | ORAL | Status: DC

## 2015-01-08 MED ORDER — CHLORZOXAZONE 500 MG PO TABS: ORAL_TABLET | ORAL | Status: DC

## 2015-01-08 NOTE — Progress Notes (Signed)
Safety precautions to be maintained throughout the outpatient stay will include: orient to surroundings, keep bed in low position, maintain call bell within reach at all times, provide assistance with transfer out of bed and ambulation.  

## 2015-01-08 NOTE — Patient Instructions (Signed)
PLAN   Continue present medication Parafon forte  and oxycodone No Tramadol for now Please note that oxycodone is now twice is strong and can cause respiratory depression excessive sedation confusion and other side effects  F/U PCP  Briana [name] for evaliation of  BP and general medical  condition  F/U surgical evaluation as discussed evaluation of left shoulder and for follow-up evaluation of surgery of right shoulder and left shoulder as well as right knee. Patient will follow-up with Dr.Krasinski as scheduled   F/U neurological evaluation. May consider pending follow-up evaluatio  May consider radiofrequency rhizolysis or intraspinal procedures pending response to present treatment and F/U evaluation   Patient to call [facility] Management Center should patient have concerns prior to scheduled return appointment.

## 2015-01-08 NOTE — Progress Notes (Signed)
Subjective:    Patient ID: Briana [name], female    DOB: 04/20/1965, 49 y.o.   MRN: [id number]  HPI    The patient is a 49 year old female who returns to pain management for further evaluation and treatment of pain involving the neck into back upper and lower extremity region. The patient recently has undergone evaluation of shoulders by Dr. Mack Guise including surgery of the right shoulder. We discussed patient's condition patient prefers to avoid additional surgery of the left shoulder and will continue exercises as has been addressed. We will continue present present medications consisting of pain upon for taking and oxycodone and this time. The patient is with improvement of lumbar lower extremity pain following previous lumbar facet, medial branch nerve blocks. The patient denies any trauma change in events of daily living the call significant change in symptomatology. The patient to call pain management Center prior to scheduled return appointment should there be significant change in condition. We will continue presently prescribed medications of oxycodone and Parafon Forte and patient will proceed with exercise program as recommended by Dr. Mack Guise and will notify Dr. Mack Guise as well as pain management. There is any change in condition or significant degree. At the present time patient is scheduled to follow-up with Dr. Mack Guise follow-up surgical evaluation. All agreed to suggested treatment plan       Review of Systems     Objective:   Physical Exam  There was tennis of the splenius capitis and occipitalis region palpation which reproduces pain of mild degree. There was unremarkable Spurling's maneuver and patient appeared to be slightly decreased grip strength. There was significant decreased range of motion of the shoulders and patient was unable to perform drop test without severe difficulty and was with severely limited range of motion of the shoulders. There was question  decreased grip strength and Tinel and Phalen's maneuver were without increased pain of significant degree. Palpation over the thoracic facet thoracic paraspinal must reason associated with moderate muscle spasm without crepitus of the thoracic region noted. Palpation over the lumbar paraspinal muscles lumbar facet region was attends to palpation of moderate degree with lateral bending rotation extension and palpation of the lumbar facets reproducing moderate discomfort. There was tends to palpation of the knees with negative anterior and posterior drawer signs without ballottement of the patella. Straight leg raising was tolerates approximately 20 without increased pain dorsiflexion noted. There was negative clonus negative Homans. DTRs difficult to this patient difficulty relaxing. 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 Moderate tendinosis of the infraspinatus tendon without a discrete tear. Moderate tendinosis of the intra-articular portion of the long head of the biceps tendon.   PLAN  Continue present  medication Parafon forte  and oxycodone No Tramadol   F/U PCP  Humphrey Rolls for evaliation of  BP and general medical  condition  F/U surgical evaluation as discussed evaluation of left shoulder and for follow-up evaluation of surgery of right shoulder and left shoulder as well as right knee. Patient will follow-up with Dr.Krasinski as scheduled   F/U neurological evaluation. May consider pending follow-up evaluatio  May consider radiofrequency rhizolysis or intraspinal procedures pending response to present treatment and F/U evaluation   Patient to call Pain Management Center should patient have concerns prior to scheduled return appointment.

## 2015-02-19 ENCOUNTER — Encounter: Payer: Self-pay | Admitting: Pain Medicine

## 2015-02-19 ENCOUNTER — Ambulatory Visit: Payer: Medicare Other | Attending: Pain Medicine | Admitting: Pain Medicine

## 2015-02-19 VITALS — BP 114/78 | HR 98 | Temp 98.3°F | Resp 16 | Ht 61.0 in | Wt 177.0 lb

## 2015-02-19 DIAGNOSIS — M5136 Other intervertebral disc degeneration, lumbar region: Secondary | ICD-10-CM

## 2015-02-19 DIAGNOSIS — M4804 Spinal stenosis, thoracic region: Secondary | ICD-10-CM | POA: Insufficient documentation

## 2015-02-19 DIAGNOSIS — M19012 Primary osteoarthritis, left shoulder: Secondary | ICD-10-CM | POA: Diagnosis not present

## 2015-02-19 DIAGNOSIS — Z9889 Other specified postprocedural states: Secondary | ICD-10-CM | POA: Diagnosis not present

## 2015-02-19 DIAGNOSIS — M5030 Other cervical disc degeneration, unspecified cervical region: Secondary | ICD-10-CM | POA: Diagnosis not present

## 2015-02-19 DIAGNOSIS — Z981 Arthrodesis status: Secondary | ICD-10-CM

## 2015-02-19 DIAGNOSIS — M546 Pain in thoracic spine: Secondary | ICD-10-CM | POA: Diagnosis present

## 2015-02-19 DIAGNOSIS — M50223 Other cervical disc displacement at C6-C7 level: Secondary | ICD-10-CM | POA: Diagnosis not present

## 2015-02-19 DIAGNOSIS — M5481 Occipital neuralgia: Secondary | ICD-10-CM

## 2015-02-19 DIAGNOSIS — M47816 Spondylosis without myelopathy or radiculopathy, lumbar region: Secondary | ICD-10-CM

## 2015-02-19 DIAGNOSIS — M19011 Primary osteoarthritis, right shoulder: Secondary | ICD-10-CM

## 2015-02-19 DIAGNOSIS — M4802 Spinal stenosis, cervical region: Secondary | ICD-10-CM | POA: Insufficient documentation

## 2015-02-19 DIAGNOSIS — G2581 Restless legs syndrome: Secondary | ICD-10-CM

## 2015-02-19 DIAGNOSIS — M170 Bilateral primary osteoarthritis of knee: Secondary | ICD-10-CM

## 2015-02-19 DIAGNOSIS — M5124 Other intervertebral disc displacement, thoracic region: Secondary | ICD-10-CM | POA: Diagnosis not present

## 2015-02-19 DIAGNOSIS — M50222 Other cervical disc displacement at C5-C6 level: Secondary | ICD-10-CM | POA: Diagnosis not present

## 2015-02-19 DIAGNOSIS — M542 Cervicalgia: Secondary | ICD-10-CM | POA: Diagnosis present

## 2015-02-19 DIAGNOSIS — M47814 Spondylosis without myelopathy or radiculopathy, thoracic region: Secondary | ICD-10-CM | POA: Diagnosis not present

## 2015-02-19 DIAGNOSIS — M533 Sacrococcygeal disorders, not elsewhere classified: Secondary | ICD-10-CM

## 2015-02-19 DIAGNOSIS — M2578 Osteophyte, vertebrae: Secondary | ICD-10-CM | POA: Insufficient documentation

## 2015-02-19 DIAGNOSIS — M5134 Other intervertebral disc degeneration, thoracic region: Secondary | ICD-10-CM | POA: Insufficient documentation

## 2015-02-19 MED ORDER — OXYCODONE HCL 10 MG PO TABS: ORAL_TABLET | ORAL | Status: DC

## 2015-02-19 MED ORDER — CHLORZOXAZONE 500 MG PO TABS: ORAL_TABLET | ORAL | Status: DC

## 2015-02-19 NOTE — Progress Notes (Signed)
Subjective:    Patient ID: Briana [name], female    DOB: 1965/02/24, 50 y.o.   MRN: [id number]  HPI The patient is a 50 year old female who returns to pain management for further evaluation and treatment of pain involving the region and neck upper extremity region shoulders and upper mid lower back and lower extremity regions. The patient is with degenerative changes of the cervical thoracic and lumbar spine. The patient is undergone prior surgery of the right shoulder and is presently planning to undergo surgery of the left shoulder. The patient will follow-up with Dr.Krasinski in this regard. We discussed patient's condition and the patient has had improvement of lumbar lower extremity pain with interventional treatment performed in pain management Center. We will continue to prescribe medications at this time and will consider patient for interventional treatment should there be increase of lower back lower extremity pain of pain of the cervical thoracic region. Patient denies any significant headaches at this time and states the predominant pain involves the region of the shoulders. The patient continues to have pain of both the cervical thoracic and lumbar regions of lesser degree shoulder. The patient is without recent trauma change in events of daily living because change in symptomatology. The patient admitted to significant increase of pain with attempted use of the left upper extremity and stated that she perform most activities with the right upper extremity at this time. We will remain available to consider patient for modifications of medications as well as to proceed with interventional treatment should there be significant change in condition. The patient was with understanding and in agreement suggested treatment plan.    Review of Systems     Objective:   Physical Exam  There was tenderness to palpation of the splenius capitis and occipitalis musculature regions of mild to moderate  degree with mild to moderate tenderness to palpation of the acromioclavicular and glenohumeral joint region. Patient appeared to be with unremarkable Spurling's maneuver. There was severely limited range of motion of the shoulder with inability to perform drop test. Grip strength appeared to be decreased and Tinel and Phalen's maneuver associated with moderate discomfort. There was tenderness over the acromial clavicular and glenohumeral joint region as well as the thoracic facet thoracic paraspinal must region with no crepitus of the thoracic region noted. Palpation over the lumbar paraspinal must reason lumbar facet region was with mild to moderate discomfort with lateral bending rotation extension and palpation of the lumbar facets reproducing mild to moderate discomfort. There was mild to moderate discomfort over the PSIS and PII S region as well as the gluteal and piriformis musculature region. There was mild tenderness of the greater trochanteric region iliotibial band region. Straight leg raising was tolerates approximately 30 without increased pain with dorsiflexion noted. There was negative clonus negative Homans knees were with crepitus of the knees with negative anterior and posterior drawer signs without ballottement of the patella. No sensory deficit or dermatomal dystrophy detected. Abdomen nontender with no costovertebral tenderness noted.      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 Moderate tendinosis of the infraspinatus tendon without a discrete tear. Moderate tendinosis of the intra-articular portion of the long head of the biceps tendon     PLAN   Continue present medication Parafon forte  and oxycodone No Tramadol for now Please note that oxycodone is now twice is strong and can cause respiratory depression excessive sedation confusion and other side effects  F/U PCP  Humphrey Rolls for evaliation of  BP and general medical  condition  F/U surgical evaluation as discussed evaluation of left shoulder and for follow-up evaluation of surgery of right shoulder and left shoulder as well as right knee. Patient will follow-up with Dr.Krasinski as scheduled Patient is to undergo surgery of the shoulder as planned with Dr. Mack Guise   F/U [facility] podiatrist as discussed and visit she store as discussed  F/U neurological evaluation. May consider pending follow-up evaluatio  May consider radiofrequency rhizolysis or intraspinal procedures pending response to present treatment and F/U evaluation   Patient to call [facility] Management Center should patient have concerns prior to scheduled return appointment.

## 2015-02-19 NOTE — Progress Notes (Signed)
Safety precautions to be maintained throughout the outpatient stay will include: orient to surroundings, keep bed in low position, maintain call bell within reach at all times, provide assistance with transfer out of bed and ambulation.  

## 2015-02-19 NOTE — Patient Instructions (Addendum)
PLAN   Continue present medication Parafon forte  and oxycodone No Tramadol for now Please note that oxycodone is now twice is strong and can cause respiratory depression excessive sedation confusion and other side effects  F/U PCP  Humphrey Rolls for evaliation of  BP and general medical  condition  F/U surgical evaluation as discussed evaluation of left shoulder and for follow-up evaluation of surgery of right shoulder and left shoulder as well as right knee. Patient will follow-up with Dr.Krasinski as scheduled Patient is to undergo surgery of the shoulder as planned with Dr. Mack Guise   F/U [facility] pod[facility]rist as discussed and visit she store as discussed  F/U neurological evaluation. May consider pending follow-up evaluatio  May consider radiofrequency rhizolysis or intraspinal procedures pending response to present treatment and F/U evaluation   Patient to call Pain Management Center should patient have concerns prior to scheduled return appointment.

## 2015-03-18 ENCOUNTER — Encounter: Payer: Self-pay | Admitting: Pain Medicine

## 2015-03-18 ENCOUNTER — Ambulatory Visit: Payer: Medicare Other | Attending: Pain Medicine | Admitting: Pain Medicine

## 2015-03-18 VITALS — BP 100/68 | HR 92 | Temp 98.6°F | Resp 16 | Ht 61.0 in | Wt 174.0 lb

## 2015-03-18 DIAGNOSIS — M50222 Other cervical disc displacement at C5-C6 level: Secondary | ICD-10-CM | POA: Insufficient documentation

## 2015-03-18 DIAGNOSIS — M4804 Spinal stenosis, thoracic region: Secondary | ICD-10-CM | POA: Diagnosis not present

## 2015-03-18 DIAGNOSIS — M47814 Spondylosis without myelopathy or radiculopathy, thoracic region: Secondary | ICD-10-CM | POA: Insufficient documentation

## 2015-03-18 DIAGNOSIS — M170 Bilateral primary osteoarthritis of knee: Secondary | ICD-10-CM

## 2015-03-18 DIAGNOSIS — M5030 Other cervical disc degeneration, unspecified cervical region: Secondary | ICD-10-CM | POA: Diagnosis not present

## 2015-03-18 DIAGNOSIS — Z9889 Other specified postprocedural states: Secondary | ICD-10-CM | POA: Insufficient documentation

## 2015-03-18 DIAGNOSIS — M5136 Other intervertebral disc degeneration, lumbar region: Secondary | ICD-10-CM

## 2015-03-18 DIAGNOSIS — G2581 Restless legs syndrome: Secondary | ICD-10-CM

## 2015-03-18 DIAGNOSIS — M5126 Other intervertebral disc displacement, lumbar region: Secondary | ICD-10-CM | POA: Insufficient documentation

## 2015-03-18 DIAGNOSIS — M5481 Occipital neuralgia: Secondary | ICD-10-CM

## 2015-03-18 DIAGNOSIS — M51369 Other intervertebral disc degeneration, lumbar region without mention of lumbar back pain or lower extremity pain: Secondary | ICD-10-CM

## 2015-03-18 DIAGNOSIS — M47816 Spondylosis without myelopathy or radiculopathy, lumbar region: Secondary | ICD-10-CM

## 2015-03-18 DIAGNOSIS — M542 Cervicalgia: Secondary | ICD-10-CM | POA: Diagnosis present

## 2015-03-18 DIAGNOSIS — M546 Pain in thoracic spine: Secondary | ICD-10-CM | POA: Diagnosis present

## 2015-03-18 DIAGNOSIS — M2578 Osteophyte, vertebrae: Secondary | ICD-10-CM | POA: Diagnosis not present

## 2015-03-18 DIAGNOSIS — M47812 Spondylosis without myelopathy or radiculopathy, cervical region: Secondary | ICD-10-CM | POA: Diagnosis not present

## 2015-03-18 DIAGNOSIS — M533 Sacrococcygeal disorders, not elsewhere classified: Secondary | ICD-10-CM

## 2015-03-18 DIAGNOSIS — M19011 Primary osteoarthritis, right shoulder: Secondary | ICD-10-CM | POA: Diagnosis not present

## 2015-03-18 DIAGNOSIS — M19012 Primary osteoarthritis, left shoulder: Secondary | ICD-10-CM

## 2015-03-18 DIAGNOSIS — M5134 Other intervertebral disc degeneration, thoracic region: Secondary | ICD-10-CM | POA: Diagnosis not present

## 2015-03-18 DIAGNOSIS — Z981 Arthrodesis status: Secondary | ICD-10-CM

## 2015-03-18 MED ORDER — CHLORZOXAZONE 500 MG PO TABS: ORAL_TABLET | ORAL | Status: DC

## 2015-03-18 MED ORDER — OXYCODONE HCL 10 MG PO TABS: ORAL_TABLET | ORAL | Status: DC

## 2015-03-18 NOTE — Progress Notes (Signed)
Subjective:    Patient ID: Briana [name], female    DOB: 1965-10-13, 50 y.o.   MRN: [id number]  HPI  The patient is a 50 year old female who returns to pain management for further evaluation and treatment of pain involving the neck entire back upper and lower extremity regions. The patient is significant pain involving the region of the left shoulder and is planning to undergo surgical intervention of the left shoulder with Dr. Mack Guise. The patient states present time she is delaying the surgery. The patient is with pain involving the knees as well as the lower back and lower extremity region. We discussed patient's overall condition and at the present time we will continue medications consisting of Parafon Forte taking and oxycodone in addition to patient's of the medications. We will remain available to consider interventional treatment pending follow-up evaluation area patient denies any trauma change in events of daily living the call significant change in symptomatology. Patient states that this left shoulder pain is quite severe and interferes with all activities of daily living as well as ability to obtain restful sleep. The patient admits to lower back lower extremity pain increased with standing walking and becoming more intense as the day progresses. We will continue present medications and will proceed with greater occipital nerve block at time of return appointment treatment of severe pain involving the cervical thoracic region associated with headaches with myoneural block injections to be performed in the region of the shoulder as well The patient agreed to suggested treatment plan     Review of Systems     Objective:   Physical Exam  There was tenderness to palpation of the splenius capitis and occipitalis musculature regions of moderate degree. There was moderate tenderness of the trapezius levator scapula rhomboid musculature region with moderate to moderately severe degree of  tenderness to palpation on the left compared to the right was significantly limited range of motion of the left shoulder compared to the right shoulder there was severe tenderness in the region of the acromioclavicular and glenohumeral joint region of the left shoulder. Patient was a slightly decreased grip strength with Tinel and Phalen's maneuver reproducing mild discomfort. Palpation of the thoracic facet thoracic paraspinal musculature region was with no crepitus of the thoracic region and was with evidence of muscle spasm. Tinel and Phalen's maneuver were without increase of pain of significant degree. Palpation over the lumbar paraspinal must reason lumbar facet region was attends to palpation of moderate degree with lateral bending rotation extension and palpation of the lumbar facets reproducing moderate discomfort. Palpation over the PSIS and PII S region reproduced mild to moderate discomfort. Palpation over the greater trochanteric region iliotibial band region reproduced moderate discomfort on the right compared to the left. Straight leg raise was tolerates approximately 20 without a definite increased pain with dorsiflexion noted. There was negative clonus negative Homans. EHL strength appeared to be slightly decreased. 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     PLAN   Continue present medication Parafon forte  and oxycodone No Tramadol for now Please note that oxycodone is now twice is strong and can cause respiratory depression excessive sedation confusion and other side effects  Greater occipital nerve block to be performed at time of return appointment  F/U PCP  Humphrey Rolls for evaliation of  BP and general medical  condition  F/U surgical evaluation as discussed Patient will follow-up with Dr.Krasinski   F/U [facility] pod[facility]rist as discussed and visit she store as discussed  F/U neurological evaluation. May consider pending follow-up evaluatio  May consider radiofrequency rhizolysis or intraspinal procedures pending response to present treatment and F/U evaluation   [facility] to [facility] should patient have concerns prior to scheduled return appointment

## 2015-03-18 NOTE — Patient Instructions (Addendum)
PLAN   Continue present medication Parafon forte  and oxycodone No Tramadol for now Please note that oxycodone is now twice is strong and can cause respiratory depression excessive sedation confusion and other side effects  Greater occipital nerve block to be performed at time of return appointment  F/U PCP  Humphrey Rolls for evaliation of  BP and general medical  condition  F/U surgical evaluation as discussed Patient will follow-up with Dr.Krasinski   F/U [facility] pod[facility]rist as discussed and visit she store as discussed  F/U neurological evaluation. May consider pending follow-up evaluatio  May consider radiofrequency rhizolysis or intraspinal procedures pending response to present treatment and F/U evaluation   Patient to call [facility] should patient have concerns prior to scheduled return appointment.Occipital Nerve Block Patient Information  Description: The occipital nerves originate in the cervical (neck) spinal cord and travel upward through muscle and tissue to supply sensation to the back of the head and top of the scalp.  In addition, the nerves control some of the muscles of the scalp.  Occipital neuralgia is an irritation of these nerves which can cause headaches, numbness of the scalp, and neck discomfort.     The occipital nerve block will interrupt nerve transmission through these nerves and can relieve pain and spasm.  The block consists of insertion of a small needle under the skin in the back of the head to deposit local anesthetic (numbing medicine) and/or steroids around the nerve.  The entire block usually lasts less than 5 minutes.  Conditions which may be treated by occipital blocks:   Muscular pain and spasm of the scalp  Nerve irritation, back of the head  Headaches  Upper neck pain  Preparation for the injection:  1. Do not eat any solid food or dairy products within 6 hours of your appointment. 2. You may drink clear liquids up to 2 hours  before appointment.  Clear liquids include water, black coffee, juice or soda.  No milk or cream please. 3. You may take your regular medication, including pain medications, with a sip of water before you appointment.  Diabetics should hold regular insulin (if taken separately) and take 1/2 normal NPH dose the morning of the procedure.  Carry some sugar containing items with you to your appointment. 4. A driver must accompany you and be prepared to drive you home after your procedure. 5. Bring all your current medications with you. 6. An IV may be inserted and sedation may be given at the discretion of the physician. 7. A blood pressure cuff, EKG, and other monitors will often be applied during the procedure.  Some patients may need to have extra oxygen administered for a short period. 8. You will be asked to provide medical information, including your allergies and medications, prior to the procedure.  We must know immediately if you are taking blood thinners (like Coumadin/Warfarin) or if you are allergic to IV iodine contrast (dye).  We must know if you could possible be pregnant.  9. Do not wear a high collared shirt or turtleneck.  Tie long hair up in the back if possible.  Possible side-effects:   Bleeding from needle site  Infection (rare, may require surgery)  Nerve injury (rare)  Hair on back of neck can be tinged with iodine scrub (this will wash out)  Light-headedness (temporary)  Pain at injection site (several days)  Decreased blood pressure (rare, temporary)  Seizure (very rare)  Call if you experience:   Hives or  difficulty breathing ( go to the emergency room)  Inflammation or drainage at the injection site(s)  Please note:  Although the local anesthetic injected can often make your painful muscles or headache feel good for several hours after the injection, the pain may return.  It takes 3-7 days for steroids to work.  You may not notice any pain relief for at  least one week.  If effective, we will often do a series of injections spaced 3-6 weeks apart to maximally decrease your pain.  If you have any questions, please call [phone] [place]  What are the risk, side effects and possible complications? Generally speaking, most procedures are safe.  However, with any procedure there are risks, side effects, and the possibility of complications.  The risks and complications are dependent upon the sites that are lesioned, or the type of nerve block to be performed.  The closer the procedure is to the spine, the more serious the risks are.  Great care is taken when placing the radio frequency needles, block needles or lesioning probes, but sometimes complications can occur. 1. Infection: Any time there is an injection through the skin, there is a risk of infection.  This is why sterile conditions are used for these blocks.  There are four possible types of infection. 1. Localized skin infection. 2. Central Nervous System Infection-This can be in the form of Meningitis, which can be deadly. 3. Epidural Infections-This can be in the form of an epidural abscess, which can cause pressure inside of the spine, causing compression of the spinal cord with subsequent paralysis. This would require an emergency surgery to decompress, and there are no guarantees that the patient would recover from the paralysis. 4. Discitis-This is an infection of the intervertebral discs.  It occurs in about 1% of discography procedures.  It is difficult to treat and it may lead to surgery.        2. Pain: the needles have to go through skin and soft tissues, will cause soreness.       3. Damage to internal structures:  The nerves to be lesioned may be near blood vessels or    other nerves which can be potentially damaged.       4. Bleeding: Bleeding is more common if the patient is taking blood thinners such as   aspirin, Coumadin, Ticiid, Plavix, etc., or if he/she have some genetic predisposition  such as hemophilia. Bleeding into the spinal canal can cause compression of the spinal  cord with subsequent paralysis.  This would require an emergency surgery to  decompress and there are no guarantees that the patient would recover from the  paralysis.       5. Pneumothorax:  Puncturing of a lung is a possibility, every time a needle is introduced in  the area of the chest or upper back.  Pneumothorax refers to free air around the  collapsed lung(s), inside of the thoracic cavity (chest cavity).  Another two possible  complications related to a similar event would include: Hemothorax and Chylothorax.   These are variations of the Pneumothorax, where instead of air around the collapsed  lung(s), you may have blood or chyle, respectively.       6. Spinal headaches: They may occur with any procedures in the area of the spine.       7. Persistent CSF (Cerebro-Spinal Fluid) leakage: This is a rare problem, but may occur  with  prolonged intrathecal or epidural catheters either due to the formation of a fistulous  track or a dural tear.       8. Nerve damage: By working so close to the spinal cord, there is always a possibility of  nerve damage, which could be as serious as a permanent spinal cord injury with  paralysis.       9. Death:  Although rare, severe deadly allergic reactions known as "Anaphylactic  reaction" can occur to any of the medications used.      10. Worsening of the symptoms:  We can always make thing worse.  What are the chances of something like this happening? Chances of any of this occuring are extremely low.  By statistics, you have more of a chance of getting killed in a motor vehicle accident: while driving to the hospital than any of the above occurring .  Nevertheless, you should be aware that they are possibilities.  In general, it is similar to taking a shower.  Everybody knows that you can  slip, hit your head and get killed.  Does that mean that you should not shower again?  Nevertheless always keep in mind that statistics do not mean anything if you happen to be on the wrong side of them.  Even if a procedure has a 1 (one) in a 1,000,000 (million) chance of going wrong, it you happen to be that one..Also, keep in mind that by statistics, you have more of a chance of having something go wrong when taking medications.  Who should not have this procedure? If you are on a blood thinning medication (e.g. Coumadin, Plavix, see list of "Blood Thinners"), or if you have an active infection going on, you should not have the procedure.  If you are taking any blood thinners, please inform your physician.  How should I prepare for this procedure?  Do not eat or drink anything at least six hours prior to the procedure.  Bring a driver with you .  It cannot be a taxi.  Come accompanied by an adult that can drive you back, and that is strong enough to help you if your legs get weak or numb from the local anesthetic.  Take all of your medicines the morning of the procedure with just enough water to swallow them.  If you have diabetes, make sure that you are scheduled to have your procedure done first thing in the morning, whenever possible.  If you have diabetes, take only half of your insulin dose and notify our nurse that you have done so as soon as you arrive at the clinic.  If you are diabetic, but only take blood sugar pills (oral hypoglycemic), then do not take them on the morning of your procedure.  You may take them after you have had the procedure.  Do not take aspirin or any aspirin-containing medications, at least eleven (11) days prior to the procedure.  They may prolong bleeding.  Wear loose fitting clothing that may be easy to take off and that you would not mind if it got stained with Betadine or blood.  Do not wear any jewelry or perfume  Remove any nail coloring.  It will  interfere with some of our monitoring equipment.  NOTE: Remember that this is not meant to be interpreted as a complete list of all possible complications.  Unforeseen problems may occur.  BLOOD THINNERS The following drugs contain aspirin or other products, which can cause increased bleeding during surgery and  should not be taken for 2 weeks prior to and 1 week after surgery.  If you should need take something for relief of minor pain, you may take acetaminophen which is found in Tylenol,m Datril, Anacin-3 and Panadol. It is not blood thinner. The products listed below are.  Do not take any of the products listed below in addition to any listed on your instruction sheet.  A.P.C or A.P.C with Codeine Codeine Phosphate Capsules #3 Ibuprofen Ridaura  ABC compound Congesprin Imuran rimadil  Advil Cope Indocin Robaxisal  Alka-Seltzer Effervescent Pain Reliever and Antacid Coricidin or Coricidin-D  Indomethacin Rufen  Alka-Seltzer plus Cold Medicine Cosprin Ketoprofen S-A-C Tablets  Anacin Analgesic Tablets or Capsules Coumadin Korlgesic Salflex  Anacin Extra Strength Analgesic tablets or capsules CP-2 Tablets Lanoril Salicylate  Anaprox Cuprimine Capsules Levenox Salocol  Anexsia-D Dalteparin Magan Salsalate  Anodynos Darvon compound Magnesium Salicylate Sine-off  Ansaid Dasin Capsules Magsal Sodium Salicylate  Anturane Depen Capsules Marnal Soma  APF Arthritis pain formula Dewitt's Pills Measurin Stanback  Argesic Dia-Gesic Meclofenamic Sulfinpyrazone  Arthritis Bayer Timed Release Aspirin Diclofenac Meclomen Sulindac  Arthritis pain formula Anacin Dicumarol Medipren Supac  Analgesic (Safety coated) Arthralgen Diffunasal Mefanamic Suprofen  Arthritis Strength Bufferin Dihydrocodeine Mepro Compound Suprol  Arthropan liquid Dopirydamole Methcarbomol with Aspirin Synalgos  ASA tablets/Enseals Disalcid Micrainin Tagament  Ascriptin Doan's Midol Talwin  Ascriptin A/D Dolene Mobidin Tanderil   Ascriptin Extra Strength Dolobid Moblgesic Ticlid  Ascriptin with Codeine Doloprin or Doloprin with Codeine Momentum Tolectin  Asperbuf Duoprin Mono-gesic Trendar  Aspergum Duradyne Motrin or Motrin IB Triminicin  Aspirin plain, buffered or enteric coated Durasal Myochrisine Trigesic  Aspirin Suppositories Easprin Nalfon Trillsate  Aspirin with Codeine Ecotrin Regular or Extra Strength Naprosyn Uracel  Atromid-S Efficin Naproxen Ursinus  Auranofin Capsules Elmiron Neocylate Vanquish  Axotal Emagrin Norgesic Verin  Azathioprine Empirin or Empirin with Codeine Normiflo Vitamin E  Azolid Emprazil Nuprin Voltaren  Bayer Aspirin plain, buffered or children's or timed BC Tablets or powders Encaprin Orgaran Warfarin Sodium  Buff-a-Comp Enoxaparin Orudis Zorpin  Buff-a-Comp with Codeine Equegesic Os-Cal-Gesic   Buffaprin Excedrin plain, buffered or Extra Strength Oxalid   Bufferin Arthritis Strength Feldene Oxphenbutazone   Bufferin plain or Extra Strength Feldene Capsules Oxycodone with Aspirin   Bufferin with Codeine Fenoprofen Fenoprofen Pabalate or Pabalate-SF   Buffets II Flogesic Panagesic   Buffinol plain or Extra Strength Florinal or Florinal with Codeine Panwarfarin   Buf-Tabs Flurbiprofen Penicillamine   Butalbital Compound Four-way cold tablets Penicillin   Butazolidin Fragmin Pepto-Bismol   Carbenicillin Geminisyn Percodan   Carna Arthritis Reliever Geopen Persantine   Carprofen Gold's salt Persistin   Chloramphenicol Goody's Phenylbutazone   Chloromycetin Haltrain Piroxlcam   Clmetidine heparin Plaquenil   Cllnoril Hyco-pap Ponstel   Clofibrate Hydroxy chloroquine Propoxyphen         Before stopping any of these medications, be sure to consult the physician who ordered them.  Some, such as Coumadin (Warfarin) are ordered to prevent or treat serious conditions such as "deep thrombosis", "pumonary embolisms", and other heart problems.  The amount of time that you may need off  of the medication may also vary with the medication and the reason for which you were taking it.  If you are taking any of these medications, please make sure you notify your pain physician before you undergo any procedures.         Pain Management Discharge Instructions  General Discharge Instructions :  If you need to reach your doctor call: Monday-Friday  8:00 am - 4:00 pm at [phone] or toll free [phone].  After clinic hours [phone] to have operator reach doctor.  Bring all of your medication bottles to all your appointments in the pain clinic.  To cancel or reschedule your appointment with Pain Management please remember to call 24 hours in advance to avoid a fee.  Refer to the educational materials which you have been given on: General Risks, I had my Procedure. Discharge Instructions, Post Sedation.  Post Procedure Instructions:  The drugs you were given will stay in your system until tomorrow, so for the next 24 hours you should not drive, make any legal decisions or drink any alcoholic beverages.  You may eat anything you prefer, but it is better to start with liquids then soups and crackers, and gradually work up to solid foods.  Please notify your doctor immediately if you have any unusual bleeding, trouble breathing or pain that is not related to your normal pain.  Depending on the type of procedure that was done, some parts of your body may feel week and/or numb.  This usually clears up by tonight or the next day.  Walk with the use of an assistive device or accompanied by an adult for the 24 hours.  You may use ice on the affected area for the first 24 hours.  Put ice in a Ziploc bag and cover with a towel and place against area 15 minutes on 15 minutes off.  You may switch to heat after 24 hours.GENERAL RISKS AND COMPLICATIONS  What are the risk, side effects and possible complications? Generally speaking, most procedures are safe.  However, with any  procedure there are risks, side effects, and the possibility of complications.  The risks and complications are dependent upon the sites that are lesioned, or the type of nerve block to be performed.  The closer the procedure is to the spine, the more serious the risks are.  Great care is taken when placing the radio frequency needles, block needles or lesioning probes, but sometimes complications can occur. 2. Infection: Any time there is an injection through the skin, there is a risk of infection.  This is why sterile conditions are used for these blocks.  There are four possible types of infection. 1. Localized skin infection. 2. Central Nervous System Infection-This can be in the form of Meningitis, which can be deadly. 3. Epidural Infections-This can be in the form of an epidural abscess, which can cause pressure inside of the spine, causing compression of the spinal cord with subsequent paralysis. This would require an emergency surgery to decompress, and there are no guarantees that the patient would recover from the paralysis. 4. Discitis-This is an infection of the intervertebral discs.  It occurs in about 1% of discography procedures.  It is difficult to treat and it may lead to surgery.        2. Pain: the needles have to go through skin and soft tissues, will cause soreness.       3. Damage to internal structures:  The nerves to be lesioned may be near blood vessels or    other nerves which can be potentially damaged.       4. Bleeding: Bleeding is more common if the patient is taking blood thinners such as  aspirin, Coumadin, Ticiid, Plavix, etc., or if he/she have some genetic predisposition  such as hemophilia. Bleeding into the spinal canal can cause compression of the spinal  cord with subsequent paralysis.  This  would require an emergency surgery to  decompress and there are no guarantees that the patient would recover from the  paralysis.       5. Pneumothorax:  Puncturing of a lung is  a possibility, every time a needle is introduced in  the area of the chest or upper back.  Pneumothorax refers to free air around the  collapsed lung(s), inside of the thoracic cavity (chest cavity).  Another two possible  complications related to a similar event would include: Hemothorax and Chylothorax.   These are variations of the Pneumothorax, where instead of air around the collapsed  lung(s), you may have blood or chyle, respectively.       6. Spinal headaches: They may occur with any procedures in the area of the spine.       7. Persistent CSF (Cerebro-Spinal Fluid) leakage: This is a rare problem, but may occur  with prolonged intrathecal or epidural catheters either due to the formation of a fistulous  track or a dural tear.       8. Nerve damage: By working so close to the spinal cord, there is always a possibility of  nerve damage, which could be as serious as a permanent spinal cord injury with  paralysis.       9. Death:  Although rare, severe deadly allergic reactions known as "Anaphylactic  reaction" can occur to any of the medications used.      10. Worsening of the symptoms:  We can always make thing worse.  What are the chances of something like this happening? Chances of any of this occuring are extremely low.  By statistics, you have more of a chance of getting killed in a motor vehicle accident: while driving to the hospital than any of the above occurring .  Nevertheless, you should be aware that they are possibilities.  In general, it is similar to taking a shower.  Everybody knows that you can slip, hit your head and get killed.  Does that mean that you should not shower again?  Nevertheless always keep in mind that statistics do not mean anything if you happen to be on the wrong side of them.  Even if a procedure has a 1 (one) in a 1,000,000 (million) chance of going wrong, it you happen to be that one..Also, keep in mind that by statistics, you have more of a chance of having  something go wrong when taking medications.  Who should not have this procedure? If you are on a blood thinning medication (e.g. Coumadin, Plavix, see list of "Blood Thinners"), or if you have an active infection going on, you should not have the procedure.  If you are taking any blood thinners, please inform your physician.  How should I prepare for this procedure?  Do not eat or drink anything at least six hours prior to the procedure.  Bring a driver with you .  It cannot be a taxi.  Come accompanied by an adult that can drive you back, and that is strong enough to help you if your legs get weak or numb from the local anesthetic.  Take all of your medicines the morning of the procedure with just enough water to swallow them.  If you have diabetes, make sure that you are scheduled to have your procedure done first thing in the morning, whenever possible.  If you have diabetes, take only half of your insulin dose and notify our nurse that you have done so as soon as  you arrive at the clinic.  If you are diabetic, but only take blood sugar pills (oral hypoglycemic), then do not take them on the morning of your procedure.  You may take them after you have had the procedure.  Do not take aspirin or any aspirin-containing medications, at least eleven (11) days prior to the procedure.  They may prolong bleeding.  Wear loose fitting clothing that may be easy to take off and that you would not mind if it got stained with Betadine or blood.  Do not wear any jewelry or perfume  Remove any nail coloring.  It will interfere with some of our monitoring equipment.  NOTE: Remember that this is not meant to be interpreted as a complete list of all possible complications.  Unforeseen problems may occur.  BLOOD THINNERS The following drugs contain aspirin or other products, which can cause increased bleeding during surgery and should not be taken for 2 weeks prior to and 1 week after surgery.  If you  should need take something for relief of minor pain, you may take acetaminophen which is found in Tylenol,m Datril, Anacin-3 and Panadol. It is not blood thinner. The products listed below are.  Do not take any of the products listed below in addition to any listed on your instruction sheet.  A.P.C or A.P.C with Codeine Codeine Phosphate Capsules #3 Ibuprofen Ridaura  ABC compound Congesprin Imuran rimadil  Advil Cope Indocin Robaxisal  Alka-Seltzer Effervescent Pain Reliever and Antacid Coricidin or Coricidin-D  Indomethacin Rufen  Alka-Seltzer plus Cold Medicine Cosprin Ketoprofen S-A-C Tablets  Anacin Analgesic Tablets or Capsules Coumadin Korlgesic Salflex  Anacin Extra Strength Analgesic tablets or capsules CP-2 Tablets Lanoril Salicylate  Anaprox Cuprimine Capsules Levenox Salocol  Anexsia-D Dalteparin Magan Salsalate  Anodynos Darvon compound Magnesium Salicylate Sine-off  Ansaid Dasin Capsules Magsal Sodium Salicylate  Anturane Depen Capsules Marnal Soma  APF Arthritis pain formula Dewitt's Pills Measurin Stanback  Argesic Dia-Gesic Meclofenamic Sulfinpyrazone  Arthritis Bayer Timed Release Aspirin Diclofenac Meclomen Sulindac  Arthritis pain formula Anacin Dicumarol Medipren Supac  Analgesic (Safety coated) Arthralgen Diffunasal Mefanamic Suprofen  Arthritis Strength Bufferin Dihydrocodeine Mepro Compound Suprol  Arthropan liquid Dopirydamole Methcarbomol with Aspirin Synalgos  ASA tablets/Enseals Disalcid Micrainin Tagament  Ascriptin Doan's Midol Talwin  Ascriptin A/D Dolene Mobidin Tanderil  Ascriptin Extra Strength Dolobid Moblgesic Ticlid  Ascriptin with Codeine Doloprin or Doloprin with Codeine Momentum Tolectin  Asperbuf Duoprin Mono-gesic Trendar  Aspergum Duradyne Motrin or Motrin IB Triminicin  Aspirin plain, buffered or enteric coated Durasal Myochrisine Trigesic  Aspirin Suppositories Easprin Nalfon Trillsate  Aspirin with Codeine Ecotrin Regular or Extra Strength  Naprosyn Uracel  Atromid-S Efficin Naproxen Ursinus  Auranofin Capsules Elmiron Neocylate Vanquish  Axotal Emagrin Norgesic Verin  Azathioprine Empirin or Empirin with Codeine Normiflo Vitamin E  Azolid Emprazil Nuprin Voltaren  Bayer Aspirin plain, buffered or children's or timed BC Tablets or powders Encaprin Orgaran Warfarin Sodium  Buff-a-Comp Enoxaparin Orudis Zorpin  Buff-a-Comp with Codeine Equegesic Os-Cal-Gesic   Buffaprin Excedrin plain, buffered or Extra Strength Oxalid   Bufferin Arthritis Strength Feldene Oxphenbutazone   Bufferin plain or Extra Strength Feldene Capsules Oxycodone with Aspirin   Bufferin with Codeine Fenoprofen Fenoprofen Pabalate or Pabalate-SF   Buffets II Flogesic Panagesic   Buffinol plain or Extra Strength Florinal or Florinal with Codeine Panwarfarin   Buf-Tabs Flurbiprofen Penicillamine   Butalbital Compound Four-way cold tablets Penicillin   Butazolidin Fragmin Pepto-Bismol   Carbenicillin Geminisyn Percodan   Carna Arthritis Reliever Geopen Persantine  Carprofen Gold's salt Persistin   Chloramphenicol Goody's Phenylbutazone   Chloromycetin Haltrain Piroxlcam   Clmetidine heparin Plaquenil   Cllnoril Hyco-pap Ponstel   Clofibrate Hydroxy chloroquine Propoxyphen         Before stopping any of these medications, be sure to consult the physician who ordered them.  Some, such as Coumadin (Warfarin) are ordered to prevent or treat serious conditions such as "deep thrombosis", "pumonary embolisms", and other heart problems.  The amount of time that you may need off of the medication may also vary with the medication and the reason for which you were taking it.  If you are taking any of these medications, please make sure you notify your pain physician before you undergo any procedures.         GENERAL RISKS AND COMPLICATIONS  What are the risk, side effects and possible complications? Generally speaking, most procedures are safe.  However, with  any procedure there are risks, side effects, and the possibility of complications.  The risks and complications are dependent upon the sites that are lesioned, or the type of nerve block to be performed.  The closer the procedure is to the spine, the more serious the risks are.  Great care is taken when placing the radio frequency needles, block needles or lesioning probes, but sometimes complications can occur. 3. Infection: Any time there is an injection through the skin, there is a risk of infection.  This is why sterile conditions are used for these blocks.  There are four possible types of infection. 1. Localized skin infection. 2. Central Nervous System Infection-This can be in the form of Meningitis, which can be deadly. 3. Epidural Infections-This can be in the form of an epidural abscess, which can cause pressure inside of the spine, causing compression of the spinal cord with subsequent paralysis. This would require an emergency surgery to decompress, and there are no guarantees that the patient would recover from the paralysis. 4. Discitis-This is an infection of the intervertebral discs.  It occurs in about 1% of discography procedures.  It is difficult to treat and it may lead to surgery.        2. Pain: the needles have to go through skin and soft tissues, will cause soreness.       3. Damage to internal structures:  The nerves to be lesioned may be near blood vessels or    other nerves which can be potentially damaged.       4. Bleeding: Bleeding is more common if the patient is taking blood thinners such as  aspirin, Coumadin, Ticiid, Plavix, etc., or if he/she have some genetic predisposition  such as hemophilia. Bleeding into the spinal canal can cause compression of the spinal  cord with subsequent paralysis.  This would require an emergency surgery to  decompress and there are no guarantees that the patient would recover from the  paralysis.       5. Pneumothorax:  Puncturing of a lung  is a possibility, every time a needle is introduced in  the area of the chest or upper back.  Pneumothorax refers to free air around the  collapsed lung(s), inside of the thoracic cavity (chest cavity).  Another two possible  complications related to a similar event would include: Hemothorax and Chylothorax.   These are variations of the Pneumothorax, where instead of air around the collapsed  lung(s), you may have blood or chyle, respectively.       6. Spinal headaches: They may  occur with any procedures in the area of the spine.       7. Persistent CSF (Cerebro-Spinal Fluid) leakage: This is a rare problem, but may occur  with prolonged intrathecal or epidural catheters either due to the formation of a fistulous  track or a dural tear.       8. Nerve damage: By working so close to the spinal cord, there is always a possibility of  nerve damage, which could be as serious as a permanent spinal cord injury with  paralysis.       9. Death:  Although rare, severe deadly allergic reactions known as "Anaphylactic  reaction" can occur to any of the medications used.      10. Worsening of the symptoms:  We can always make thing worse.  What are the chances of something like this happening? Chances of any of this occuring are extremely low.  By statistics, you have more of a chance of getting killed in a motor vehicle accident: while driving to the hospital than any of the above occurring .  Nevertheless, you should be aware that they are possibilities.  In general, it is similar to taking a shower.  Everybody knows that you can slip, hit your head and get killed.  Does that mean that you should not shower again?  Nevertheless always keep in mind that statistics do not mean anything if you happen to be on the wrong side of them.  Even if a procedure has a 1 (one) in a 1,000,000 (million) chance of going wrong, it you happen to be that one..Also, keep in mind that by statistics, you have more of a chance of having  something go wrong when taking medications.  Who should not have this procedure? If you are on a blood thinning medication (e.g. Coumadin, Plavix, see list of "Blood Thinners"), or if you have an active infection going on, you should not have the procedure.  If you are taking any blood thinners, please inform your physician.  How should I prepare for this procedure?  Do not eat or drink anything at least six hours prior to the procedure.  Bring a driver with you .  It cannot be a taxi.  Come accompanied by an adult that can drive you back, and that is strong enough to help you if your legs get weak or numb from the local anesthetic.  Take all of your medicines the morning of the procedure with just enough water to swallow them.  If you have diabetes, make sure that you are scheduled to have your procedure done first thing in the morning, whenever possible.  If you have diabetes, take only half of your insulin dose and notify our nurse that you have done so as soon as you arrive at the clinic.  If you are diabetic, but only take blood sugar pills (oral hypoglycemic), then do not take them on the morning of your procedure.  You may take them after you have had the procedure.  Do not take aspirin or any aspirin-containing medications, at least eleven (11) days prior to the procedure.  They may prolong bleeding.  Wear loose fitting clothing that may be easy to take off and that you would not mind if it got stained with Betadine or blood.  Do not wear any jewelry or perfume  Remove any nail coloring.  It will interfere with some of our monitoring equipment.  NOTE: Remember that this is not meant to be interpreted as  a complete list of all possible complications.  Unforeseen problems may occur.  BLOOD THINNERS The following drugs contain aspirin or other products, which can cause increased bleeding during surgery and should not be taken for 2 weeks prior to and 1 week after surgery.  If you  should need take something for relief of minor pain, you may take acetaminophen which is found in Tylenol,m Datril, Anacin-3 and Panadol. It is not blood thinner. The products listed below are.  Do not take any of the products listed below in addition to any listed on your instruction sheet.  A.P.C or A.P.C with Codeine Codeine Phosphate Capsules #3 Ibuprofen Ridaura  ABC compound Congesprin Imuran rimadil  Advil Cope Indocin Robaxisal  Alka-Seltzer Effervescent Pain Reliever and Antacid Coricidin or Coricidin-D  Indomethacin Rufen  Alka-Seltzer plus Cold Medicine Cosprin Ketoprofen S-A-C Tablets  Anacin Analgesic Tablets or Capsules Coumadin Korlgesic Salflex  Anacin Extra Strength Analgesic tablets or capsules CP-2 Tablets Lanoril Salicylate  Anaprox Cuprimine Capsules Levenox Salocol  Anexsia-D Dalteparin Magan Salsalate  Anodynos Darvon compound Magnesium Salicylate Sine-off  Ansaid Dasin Capsules Magsal Sodium Salicylate  Anturane Depen Capsules Marnal Soma  APF Arthritis pain formula Dewitt's Pills Measurin Stanback  Argesic Dia-Gesic Meclofenamic Sulfinpyrazone  Arthritis Bayer Timed Release Aspirin Diclofenac Meclomen Sulindac  Arthritis pain formula Anacin Dicumarol Medipren Supac  Analgesic (Safety coated) Arthralgen Diffunasal Mefanamic Suprofen  Arthritis Strength Bufferin Dihydrocodeine Mepro Compound Suprol  Arthropan liquid Dopirydamole Methcarbomol with Aspirin Synalgos  ASA tablets/Enseals Disalcid Micrainin Tagament  Ascriptin Doan's Midol Talwin  Ascriptin A/D Dolene Mobidin Tanderil  Ascriptin Extra Strength Dolobid Moblgesic Ticlid  Ascriptin with Codeine Doloprin or Doloprin with Codeine Momentum Tolectin  Asperbuf Duoprin Mono-gesic Trendar  Aspergum Duradyne Motrin or Motrin IB Triminicin  Aspirin plain, buffered or enteric coated Durasal Myochrisine Trigesic  Aspirin Suppositories Easprin Nalfon Trillsate  Aspirin with Codeine Ecotrin Regular or Extra Strength  Naprosyn Uracel  Atromid-S Efficin Naproxen Ursinus  Auranofin Capsules Elmiron Neocylate Vanquish  Axotal Emagrin Norgesic Verin  Azathioprine Empirin or Empirin with Codeine Normiflo Vitamin E  Azolid Emprazil Nuprin Voltaren  Bayer Aspirin plain, buffered or children's or timed BC Tablets or powders Encaprin Orgaran Warfarin Sodium  Buff-a-Comp Enoxaparin Orudis Zorpin  Buff-a-Comp with Codeine Equegesic Os-Cal-Gesic   Buffaprin Excedrin plain, buffered or Extra Strength Oxalid   Bufferin Arthritis Strength Feldene Oxphenbutazone   Bufferin plain or Extra Strength Feldene Capsules Oxycodone with Aspirin   Bufferin with Codeine Fenoprofen Fenoprofen Pabalate or Pabalate-SF   Buffets II Flogesic Panagesic   Buffinol plain or Extra Strength Florinal or Florinal with Codeine Panwarfarin   Buf-Tabs Flurbiprofen Penicillamine   Butalbital Compound Four-way cold tablets Penicillin   Butazolidin Fragmin Pepto-Bismol   Carbenicillin Geminisyn Percodan   Carna Arthritis Reliever Geopen Persantine   Carprofen Gold's salt Persistin   Chloramphenicol Goody's Phenylbutazone   Chloromycetin Haltrain Piroxlcam   Clmetidine heparin Plaquenil   Cllnoril Hyco-pap Ponstel   Clofibrate Hydroxy chloroquine Propoxyphen         Before stopping any of these medications, be sure to consult the physician who ordered them.  Some, such as Coumadin (Warfarin) are ordered to prevent or treat serious conditions such as "deep thrombosis", "pumonary embolisms", and other heart problems.  The amount of time that you may need off of the medication may also vary with the medication and the reason for which you were taking it.  If you are taking any of these medications, please make sure you notify your pain physician before  you undergo any procedures.

## 2015-03-27 ENCOUNTER — Telehealth: Payer: Self-pay | Admitting: Pain Medicine

## 2015-03-27 NOTE — Telephone Encounter (Signed)
Called to get script for lidocaine cream, informed them patient must come to appt to get scripts from Dr. Primus Bravo

## 2015-03-30 ENCOUNTER — Telehealth: Payer: Self-pay | Admitting: Pain Medicine

## 2015-03-30 ENCOUNTER — Ambulatory Visit: Payer: Medicare Other | Admitting: Pain Medicine

## 2015-03-30 NOTE — Telephone Encounter (Signed)
Has kidney infection and is on antibiotics for it, per dr crisp

## 2015-04-01 ENCOUNTER — Other Ambulatory Visit: Payer: Self-pay | Admitting: Pain Medicine

## 2015-04-13 ENCOUNTER — Ambulatory Visit: Payer: Medicare Other | Attending: Pain Medicine | Admitting: Pain Medicine

## 2015-04-13 ENCOUNTER — Encounter: Payer: Self-pay | Admitting: Pain Medicine

## 2015-04-13 VITALS — BP 141/72 | HR 98 | Temp 97.7°F | Resp 15 | Ht 61.0 in | Wt 174.0 lb

## 2015-04-13 DIAGNOSIS — G2581 Restless legs syndrome: Secondary | ICD-10-CM

## 2015-04-13 DIAGNOSIS — M47816 Spondylosis without myelopathy or radiculopathy, lumbar region: Secondary | ICD-10-CM

## 2015-04-13 DIAGNOSIS — M5030 Other cervical disc degeneration, unspecified cervical region: Secondary | ICD-10-CM | POA: Insufficient documentation

## 2015-04-13 DIAGNOSIS — M19011 Primary osteoarthritis, right shoulder: Secondary | ICD-10-CM

## 2015-04-13 DIAGNOSIS — M2578 Osteophyte, vertebrae: Secondary | ICD-10-CM | POA: Insufficient documentation

## 2015-04-13 DIAGNOSIS — M4802 Spinal stenosis, cervical region: Secondary | ICD-10-CM | POA: Insufficient documentation

## 2015-04-13 DIAGNOSIS — M19012 Primary osteoarthritis, left shoulder: Secondary | ICD-10-CM

## 2015-04-13 DIAGNOSIS — R51 Headache: Secondary | ICD-10-CM | POA: Diagnosis present

## 2015-04-13 DIAGNOSIS — M50222 Other cervical disc displacement at C5-C6 level: Secondary | ICD-10-CM | POA: Diagnosis not present

## 2015-04-13 DIAGNOSIS — M47812 Spondylosis without myelopathy or radiculopathy, cervical region: Secondary | ICD-10-CM | POA: Insufficient documentation

## 2015-04-13 DIAGNOSIS — M542 Cervicalgia: Secondary | ICD-10-CM | POA: Diagnosis present

## 2015-04-13 DIAGNOSIS — M533 Sacrococcygeal disorders, not elsewhere classified: Secondary | ICD-10-CM

## 2015-04-13 DIAGNOSIS — M170 Bilateral primary osteoarthritis of knee: Secondary | ICD-10-CM

## 2015-04-13 DIAGNOSIS — Z981 Arthrodesis status: Secondary | ICD-10-CM

## 2015-04-13 DIAGNOSIS — M5481 Occipital neuralgia: Secondary | ICD-10-CM

## 2015-04-13 DIAGNOSIS — M5136 Other intervertebral disc degeneration, lumbar region: Secondary | ICD-10-CM

## 2015-04-13 DIAGNOSIS — M5134 Other intervertebral disc degeneration, thoracic region: Secondary | ICD-10-CM

## 2015-04-13 MED ORDER — ORPHENADRINE CITRATE 30 MG/ML IJ SOLN: 60.0000 mg | Freq: Once | INTRAMUSCULAR | Status: DC

## 2015-04-13 MED ORDER — TRIAMCINOLONE ACETONIDE 40 MG/ML IJ SUSP
INTRAMUSCULAR | Status: AC
  Administered 2015-04-13: 13:00:00
  Filled 2015-04-13: qty 1

## 2015-04-13 MED ORDER — MIDAZOLAM HCL 5 MG/5ML IJ SOLN: 5.0000 mg | Freq: Once | INTRAMUSCULAR | Status: DC

## 2015-04-13 MED ORDER — TRIAMCINOLONE ACETONIDE 40 MG/ML IJ SUSP: 40.0000 mg | Freq: Once | INTRAMUSCULAR | Status: DC

## 2015-04-13 MED ORDER — LACTATED RINGERS IV SOLN: 1000.0000 mL | INTRAVENOUS | Status: DC

## 2015-04-13 MED ORDER — FENTANYL CITRATE (PF) 100 MCG/2ML IJ SOLN: 100.0000 ug | Freq: Once | INTRAMUSCULAR | Status: DC

## 2015-04-13 MED ORDER — BUPIVACAINE HCL (PF) 0.25 % IJ SOLN: 30.0000 mL | Freq: Once | INTRAMUSCULAR | Status: DC

## 2015-04-13 MED ORDER — FENTANYL CITRATE (PF) 100 MCG/2ML IJ SOLN
INTRAMUSCULAR | Status: AC
  Administered 2015-04-13: 100 ug via INTRAVENOUS
  Filled 2015-04-13: qty 2

## 2015-04-13 MED ORDER — ORPHENADRINE CITRATE 30 MG/ML IJ SOLN
INTRAMUSCULAR | Status: AC
Start: 2015-04-13 — End: 2015-04-13
  Administered 2015-04-13: 13:00:00
  Filled 2015-04-13: qty 2

## 2015-04-13 MED ORDER — BUPIVACAINE HCL (PF) 0.25 % IJ SOLN
INTRAMUSCULAR | Status: AC
  Administered 2015-04-13: 13:00:00
  Filled 2015-04-13: qty 30

## 2015-04-13 MED ORDER — MIDAZOLAM HCL 5 MG/5ML IJ SOLN
INTRAMUSCULAR | Status: AC
  Administered 2015-04-13: 13:00:00
  Filled 2015-04-13: qty 5

## 2015-04-13 NOTE — Patient Instructions (Addendum)
PLAN   Continue present medication Parafon forte  and oxycodone  F/U PCP  Humphrey Rolls for evaliation of  BP and general medical  condition  F/U surgical evaluation as discussed Patient will follow-up with Dr.Krasinski   F/U [facility] pod[facility]rist as discussed and visit she store as discussed  F/U neurological evaluation. May consider pending follow-up evaluatio  May consider radiofrequency rhizolysis or intraspinal procedures pending response to present treatment and F/U evaluation   [facility] to [facility] should patient have concerns prior to scheduled return appointment.GENERAL RISKS AND COMPLICATIONS  What are the risk, side effects and possible complications? Generally speaking, most procedures are safe.  However, with any procedure there are risks, side effects, and the possibility of complications.  The risks and complications are dependent upon the sites that are lesioned, or the type of nerve block to be performed.  The closer the procedure is to the spine, the more serious the risks are.  Great care is taken when placing the radio frequency needles, block needles or lesioning probes, but sometimes complications can occur. 1. Infection: Any time there is an injection through the skin, there is a risk of infection.  This is why sterile conditions are used for these blocks.  There are four possible types of infection. 1. Localized skin infection. 2. Central Nervous System Infection-This can be in the form of Meningitis, which can be deadly. 3. Epidural Infections-This can be in the form of an epidural abscess, which can cause pressure inside of the spine, causing compression of the spinal cord with subsequent paralysis. This would require an emergency surgery to decompress, and there are no guarantees that the patient would recover from the paralysis. 4. Discitis-This is an infection of the intervertebral discs.  It occurs in about 1% of discography procedures.  It is difficult  to treat and it may lead to surgery.        2. Pain: the needles have to go through skin and soft tissues, will cause soreness.       3. Damage to internal structures:  The nerves to be lesioned may be near blood vessels or    other nerves which can be potentially damaged.       4. Bleeding: Bleeding is more common if the patient is taking blood thinners such as  aspirin, Coumadin, Ticiid, Plavix, etc., or if he/she have some genetic predisposition  such as hemophilia. Bleeding into the spinal canal can cause compression of the spinal  cord with subsequent paralysis.  This would require an emergency surgery to  decompress and there are no guarantees that the patient would recover from the  paralysis.       5. Pneumothorax:  Puncturing of a lung is a possibility, every time a needle is introduced in  the area of the chest or upper back.  Pneumothorax refers to free air around the  collapsed lung(s), inside of the thoracic cavity (chest cavity).  Another two possible  complications related to a similar event would include: Hemothorax and Chylothorax.   These are variations of the Pneumothorax, where instead of air around the collapsed  lung(s), you may have blood or chyle, respectively.       6. Spinal headaches: They may occur with any procedures in the area of the spine.       7. Persistent CSF (Cerebro-Spinal Fluid) leakage: This is a rare problem, but may occur  with prolonged intrathecal or epidural catheters either due to the formation of a  fistulous  track or a dural tear.       8. Nerve damage: By working so close to the spinal cord, there is always a possibility of  nerve damage, which could be as serious as a permanent spinal cord injury with  paralysis.       9. Death:  Although rare, severe deadly allergic reactions known as "Anaphylactic  reaction" can occur to any of the medications used.      10. Worsening of the symptoms:  We can always make thing worse.  What are the chances of something  like this happening? Chances of any of this occuring are extremely low.  By statistics, you have more of a chance of getting killed in a motor vehicle accident: while driving to the hospital than any of the above occurring .  Nevertheless, you should be aware that they are possibilities.  In general, it is similar to taking a shower.  Everybody knows that you can slip, hit your head and get killed.  Does that mean that you should not shower again?  Nevertheless always keep in mind that statistics do not mean anything if you happen to be on the wrong side of them.  Even if a procedure has a 1 (one) in a 1,000,000 (million) chance of going wrong, it you happen to be that one..Also, keep in mind that by statistics, you have more of a chance of having something go wrong when taking medications.  Who should not have this procedure? If you are on a blood thinning medication (e.g. Coumadin, Plavix, see list of "Blood Thinners"), or if you have an active infection going on, you should not have the procedure.  If you are taking any blood thinners, please inform your physician.  How should I prepare for this procedure?  Do not eat or drink anything at least six hours prior to the procedure.  Bring a driver with you .  It cannot be a taxi.  Come accompanied by an adult that can drive you back, and that is strong enough to help you if your legs get weak or numb from the local anesthetic.  Take all of your medicines the morning of the procedure with just enough water to swallow them.  If you have diabetes, make sure that you are scheduled to have your procedure done first thing in the morning, whenever possible.  If you have diabetes, take only half of your insulin dose and notify our nurse that you have done so as soon as you arrive at the clinic.  If you are diabetic, but only take blood sugar pills (oral hypoglycemic), then do not take them on the morning of your procedure.  You may take them after you  have had the procedure.  Do not take aspirin or any aspirin-containing medications, at least eleven (11) days prior to the procedure.  They may prolong bleeding.  Wear loose fitting clothing that may be easy to take off and that you would not mind if it got stained with Betadine or blood.  Do not wear any jewelry or perfume  Remove any nail coloring.  It will interfere with some of our monitoring equipment.  NOTE: Remember that this is not meant to be interpreted as a complete list of all possible complications.  Unforeseen problems may occur.  BLOOD THINNERS The following drugs contain aspirin or other products, which can cause increased bleeding during surgery and should not be taken for 2 weeks prior to and 1 week  after surgery.  If you should need take something for relief of minor pain, you may take acetaminophen which is found in Tylenol,m Datril, Anacin-3 and Panadol. It is not blood thinner. The products listed below are.  Do not take any of the products listed below in addition to any listed on your instruction sheet.  A.P.C or A.P.C with Codeine Codeine Phosphate Capsules #3 Ibuprofen Ridaura  ABC compound Congesprin Imuran rimadil  Advil Cope Indocin Robaxisal  Alka-Seltzer Effervescent Pain Reliever and Antacid Coricidin or Coricidin-D  Indomethacin Rufen  Alka-Seltzer plus Cold Medicine Cosprin Ketoprofen S-A-C Tablets  Anacin Analgesic Tablets or Capsules Coumadin Korlgesic Salflex  Anacin Extra Strength Analgesic tablets or capsules CP-2 Tablets Lanoril Salicylate  Anaprox Cuprimine Capsules Levenox Salocol  Anexsia-D Dalteparin Magan Salsalate  Anodynos Darvon compound Magnesium Salicylate Sine-off  Ansaid Dasin Capsules Magsal Sodium Salicylate  Anturane Depen Capsules Marnal Soma  APF Arthritis pain formula Dewitt's Pills Measurin Stanback  Argesic Dia-Gesic Meclofenamic Sulfinpyrazone  Arthritis Bayer Timed Release Aspirin Diclofenac Meclomen Sulindac  Arthritis pain  formula Anacin Dicumarol Medipren Supac  Analgesic (Safety coated) Arthralgen Diffunasal Mefanamic Suprofen  Arthritis Strength Bufferin Dihydrocodeine Mepro Compound Suprol  Arthropan liquid Dopirydamole Methcarbomol with Aspirin Synalgos  ASA tablets/Enseals Disalcid Micrainin Tagament  Ascriptin Doan's Midol Talwin  Ascriptin A/D Dolene Mobidin Tanderil  Ascriptin Extra Strength Dolobid Moblgesic Ticlid  Ascriptin with Codeine Doloprin or Doloprin with Codeine Momentum Tolectin  Asperbuf Duoprin Mono-gesic Trendar  Aspergum Duradyne Motrin or Motrin IB Triminicin  Aspirin plain, buffered or enteric coated Durasal Myochrisine Trigesic  Aspirin Suppositories Easprin Nalfon Trillsate  Aspirin with Codeine Ecotrin Regular or Extra Strength Naprosyn Uracel  Atromid-S Efficin Naproxen Ursinus  Auranofin Capsules Elmiron Neocylate Vanquish  Axotal Emagrin Norgesic Verin  Azathioprine Empirin or Empirin with Codeine Normiflo Vitamin E  Azolid Emprazil Nuprin Voltaren  Bayer Aspirin plain, buffered or children's or timed BC Tablets or powders Encaprin Orgaran Warfarin Sodium  Buff-a-Comp Enoxaparin Orudis Zorpin  Buff-a-Comp with Codeine Equegesic Os-Cal-Gesic   Buffaprin Excedrin plain, buffered or Extra Strength Oxalid   Bufferin Arthritis Strength Feldene Oxphenbutazone   Bufferin plain or Extra Strength Feldene Capsules Oxycodone with Aspirin   Bufferin with Codeine Fenoprofen Fenoprofen Pabalate or Pabalate-SF   Buffets II Flogesic Panagesic   Buffinol plain or Extra Strength Florinal or Florinal with Codeine Panwarfarin   Buf-Tabs Flurbiprofen Penicillamine   Butalbital Compound Four-way cold tablets Penicillin   Butazolidin Fragmin Pepto-Bismol   Carbenicillin Geminisyn Percodan   Carna Arthritis Reliever Geopen Persantine   Carprofen Gold's salt Persistin   Chloramphenicol Goody's Phenylbutazone   Chloromycetin Haltrain Piroxlcam   Clmetidine heparin Plaquenil   Cllnoril  Hyco-pap Ponstel   Clofibrate Hydroxy chloroquine Propoxyphen         Before stopping any of these medications, be sure to consult the physician who ordered them.  Some, such as Coumadin (Warfarin) are ordered to prevent or treat serious conditions such as "deep thrombosis", "pumonary embolisms", and other heart problems.  The amount of time that you may need off of the medication may also vary with the medication and the reason for which you were taking it.  If you are taking any of these medications, please make sure you notify your pain physician before you undergo any procedures.

## 2015-04-13 NOTE — Progress Notes (Signed)
Recently completed doxycycline for UTI.

## 2015-04-13 NOTE — Progress Notes (Signed)
   Subjective:    Patient ID: Briana [name], female    DOB: Apr 21, 1965, 50 y.o.   MRN: [id number]  HPI  NOTE: The patient is a 50 y.o.-year-old female who returns to the [facility] for further evaluation and treatment of pain consisting of pain involving the region of the neck and headache.  Patient is with prior studies revealing patient to be with degenerative disc disease cervical spine Central spondylosis C5-C6 small central disc protrusion and mild impression upon the ventral thecal sac, C6-7 mild broad-based disc osteophyte complex more focal centrally and impression upon the ventral thecal sac, C4-5 mild right foraminal stenosis degenerative changes .  there is concern regarding significant component of patient's pain being due to bilateral occipital neuralgia The risks, benefits, and expectations of the procedure have been discussed and explained to patient, who is understanding and wishes to proceed with interventional treatment as discussed and as explained to patient.  Will proceed with greater occipital nerve blocks with myoneural block injections at this time as discussed and as explained to patient.  All are understanding and in agreement with suggested treatment plan.    PROCEDURE:  Greater occipital nerve block on the left side with IV Versed, IV Fentanyl, conscious sedation, EKG, blood pressure, pulse, pulse oximetry monitoring.  Procedure performed with patient in prone position.  Greater occipital nerve block on the left side.   With patient in prone position, Betadine prep of proposed entry site accomplished.  Following identification of the nuchal ridge, 22 -gauge needle was inserted at the level of the nuchal ridge medial to the occipital artery.  Following negative aspiration, 4cc 0.25% bupivacaine with Kenalog injected for left greater occipital nerve block.  Needle was removed.  Patient tolerated injection well.   Greater occipital nerve block on the rightt side. The  greater occipital nerve block on the right side was performed exactly as the left greater occipital nerve block was performed and utilizing the same technique     Myoneural block injection of the gluteal musculature region Following Betadine prep of proposed entry site a 22-gauge needle was inserted in the gluteal musculature region and following negative aspiration 2 cc of 0.25% bupivacaine with Norflex was injected into the gluteal musculature region times to.   A total of 10 mg Kenalog was utilized for the entire procedure.  PLAN:    1. Medications: Will continue presently prescribed medications Parafon Forte take and oxycodone at this time. 2. Patient to follow up with primary care physician Dr.Khan  for evaluation of blood pressure and general medical condition status post procedure performed on today's visit. 3. Neurological evaluation for further assessment of headaches for further studies as discussed. 4. Surgical evaluation as discussed. Patient will follow-up with Dr. Mack Guise as well as undergo follow-up neurosurgical evaluation as discussed  5. Patient may be candidate for Botox injections, radiofrequency procedures, as well as implantation type procedures pending response to treatment rendered on today's visit and pending follow-up evaluation. 6. Patient has been advised to adhere to proper body mechanics and to avoid activities which appear to aggravate condition.cations:  Will continue presently prescribed medications at this time. 7. The patient is understanding and in agreement with the suggested treatment plan.   Review of Systems     Objective:   Physical Exam        Assessment & Plan:

## 2015-04-14 ENCOUNTER — Telehealth: Payer: Self-pay | Admitting: *Deleted

## 2015-04-14 NOTE — Telephone Encounter (Signed)
Patient verbalizes no complications from procedure on yesterday. 

## 2015-04-15 ENCOUNTER — Encounter: Payer: Self-pay | Admitting: Pain Medicine

## 2015-04-15 ENCOUNTER — Ambulatory Visit: Payer: Medicare Other | Attending: Pain Medicine | Admitting: Pain Medicine

## 2015-04-15 VITALS — BP 111/81 | HR 93 | Temp 98.1°F | Resp 16 | Ht 61.0 in | Wt 174.0 lb

## 2015-04-15 DIAGNOSIS — M170 Bilateral primary osteoarthritis of knee: Secondary | ICD-10-CM

## 2015-04-15 DIAGNOSIS — M2578 Osteophyte, vertebrae: Secondary | ICD-10-CM | POA: Insufficient documentation

## 2015-04-15 DIAGNOSIS — M5481 Occipital neuralgia: Secondary | ICD-10-CM | POA: Diagnosis not present

## 2015-04-15 DIAGNOSIS — M5134 Other intervertebral disc degeneration, thoracic region: Secondary | ICD-10-CM

## 2015-04-15 DIAGNOSIS — Z981 Arthrodesis status: Secondary | ICD-10-CM

## 2015-04-15 DIAGNOSIS — M50222 Other cervical disc displacement at C5-C6 level: Secondary | ICD-10-CM | POA: Diagnosis not present

## 2015-04-15 DIAGNOSIS — M47814 Spondylosis without myelopathy or radiculopathy, thoracic region: Secondary | ICD-10-CM | POA: Diagnosis not present

## 2015-04-15 DIAGNOSIS — M542 Cervicalgia: Secondary | ICD-10-CM | POA: Diagnosis present

## 2015-04-15 DIAGNOSIS — R51 Headache: Secondary | ICD-10-CM | POA: Insufficient documentation

## 2015-04-15 DIAGNOSIS — M5136 Other intervertebral disc degeneration, lumbar region: Secondary | ICD-10-CM | POA: Diagnosis not present

## 2015-04-15 DIAGNOSIS — M4804 Spinal stenosis, thoracic region: Secondary | ICD-10-CM | POA: Insufficient documentation

## 2015-04-15 DIAGNOSIS — M5030 Other cervical disc degeneration, unspecified cervical region: Secondary | ICD-10-CM | POA: Diagnosis not present

## 2015-04-15 DIAGNOSIS — G2581 Restless legs syndrome: Secondary | ICD-10-CM

## 2015-04-15 DIAGNOSIS — Z9889 Other specified postprocedural states: Secondary | ICD-10-CM | POA: Insufficient documentation

## 2015-04-15 DIAGNOSIS — M19011 Primary osteoarthritis, right shoulder: Secondary | ICD-10-CM | POA: Diagnosis not present

## 2015-04-15 DIAGNOSIS — M47812 Spondylosis without myelopathy or radiculopathy, cervical region: Secondary | ICD-10-CM | POA: Diagnosis not present

## 2015-04-15 DIAGNOSIS — M19012 Primary osteoarthritis, left shoulder: Secondary | ICD-10-CM

## 2015-04-15 DIAGNOSIS — M5126 Other intervertebral disc displacement, lumbar region: Secondary | ICD-10-CM | POA: Diagnosis not present

## 2015-04-15 DIAGNOSIS — M47816 Spondylosis without myelopathy or radiculopathy, lumbar region: Secondary | ICD-10-CM

## 2015-04-15 DIAGNOSIS — M533 Sacrococcygeal disorders, not elsewhere classified: Secondary | ICD-10-CM

## 2015-04-15 MED ORDER — CHLORZOXAZONE 500 MG PO TABS: ORAL_TABLET | ORAL | Status: DC

## 2015-04-15 MED ORDER — OXYCODONE HCL 10 MG PO TABS: ORAL_TABLET | ORAL | Status: DC

## 2015-04-15 NOTE — Progress Notes (Signed)
Safety precautions to be maintained throughout the outpatient stay will include: orient to surroundings, keep bed in low position, maintain call bell within reach at all times, provide assistance with transfer out of bed and ambulation.  

## 2015-04-15 NOTE — Progress Notes (Signed)
Subjective:    Patient ID: Briana [name], female    DOB: [birth year], 50 y.o.   MRN: [id number]  HPI  The patient is a 50 year old female who returns to pain management for further evaluation and treatment of pain involving the neck associated with headaches as well as pain shoulder mid back lower back and lower extremity regions.  The patient is with improvement of headaches following greater occipital nerve blocks. The patient continues to have significant pain involving the region of the shoulder as and is with pain involving the mid back region a mild degree as well as the lower back and lower extremity region. The patient continues Parafon forte and oxycodone as well as other medications. We discussed patient's condition and will avoid interventional treatment at the present time. The patient will undergo further evaluation including gynecological evaluation as we discussed on today's visit.  . The patient will call pain management if she has exacerbation of pain of any other concerns regarding her condition. The patient states that the lower back lower extremity pain has increased to some degree. The patient states that she is able to perform most activities of daily living as well as obtaining restful sleep with the present level of pain control. We will consider additional treatment pending follow-up evaluation as discussed. Patient is to call should there be significant change in condition prior to scheduled return appointment       Review of Systems     Objective:   Physical Exam  Palpation of the splenius capitis and occipitalis musculature regions reproduces minimal discomfort. There were no masses of the head and neck noted and no bounding pulsations of the temporal region noted. Palpation of the cervical facet cervical paraspinal musculature region was attends to palpation of mild to moderate degree. The patient was moderate tenderness of the acromioclavicular and glenohumeral joint  region and was with limited range of motion of the acromioclavicular joint on the left as well as on the right with grip strength being decreased with Tinel and Phalen's maneuver reproducing minimal discomfort. Palpation over the thoracic region thoracic facet region was attends to palpation of moderate degree with no crepitus of the thoracic region noted. There was evidence of muscle spasm moderate degree of the upper mid and lower thoracic regions. Palpation over the lumbar paraspinal must reason lumbar facet region was attends to palpation of moderate degree. Lateral bending rotation extension and palpation of the lumbar facets reproduce moderate discomfort. There was mild to moderate tenderness of the PSIS and PII S region with mild tenderness of the greater trochanteric region and iliotibial band region. Straight leg raising was tolerated to 30 without a definite increased pain with dorsiflexion noted. Patient with mild difficulty attending to stand on tiptoes and heels. The knees were tenderness to palpation with no increased warmth or erythema noted with negative anterior and posterior drawer signs and without ballottement of the patella. There was crepitus of the knees noted with increased pain with range of motion maneuvers of the knees. No sensory deficit or dermatomal distribution was detected. There was negative clonus negative Homans. Abdomen without excessive tends to palpation and no costovertebral tenderness noted     Assessment & Plan:      Bilateral occipital neuralgia  Degenerative disc disease of the lumbar spine  Lumbar facet syndrome  Degenerative disc disease cervical spine Central spondylosis C5-C6 small central disc protrusion and mild impression upon the ventral thecal sac, C6-7 mild broad-based disc osteophyte complex more focal centrally  and impression upon the ventral thecal sac, C4-5 mild right foraminal stenosis degenerative changes  Thoracic degenerative disc  disease T9-10, T10-11, T11-12, degenerative changes with central canal stenosis  Degenerative disc disease lumbar spine T12-L1, L1-2, L2-3,, L3-4, L4-5, L5-S1 with paracentral disc bulging partial effacement of the anterior cerebrospinal fluid space  Degenerative joint disease of shouldersStatus post surgery of the right shoulder  MRI of left shoulder with severe tendinosis of the supraspinatus tendon with a small partial thickness bursal surface tear of the posterior fibers.    PLAN   Continue present medication Parafon forte  and oxycodone No Tramadol for now Please note that oxycodone is now twice is strong and can cause respiratory depression excessive sedation confusion and other side effects  F/U PCP  Humphrey Rolls for evaliation of  BP and general medical  Condition  F/U GYN as discussed  F/U surgical evaluation as discussed Patient will follow-up with Dr.Krasinski   F/U [facility] pod[facility] as discussed and visit she store as discussed  F/U neurological evaluation. May consider pending follow-up evaluatio  May consider radiofrequency rhizolysis or intraspinal procedures pending response to present treatment and F/U evaluation   Patient to call [facility] should patient have concerns prior to scheduled return appointment.

## 2015-04-15 NOTE — Patient Instructions (Addendum)
PLAN   Continue present medication Parafon forte  and oxycodone No Tramadol for now Please note that oxycodone is now twice is strong and can cause respiratory depression excessive sedation confusion and other side effects  F/U PCP  Briana [name] for evaliation of  BP and general medical  Condition  F/U GYN as discussed  F/U surgical evaluation as discussed Patient will follow-up with Briana [name]   F/U [facility] pod[facility] as discussed and visit she store as discussed  F/U neurological evaluation. May consider pending follow-up evaluatio  May consider radiofrequency rhizolysis or intraspinal procedures pending response to present treatment and F/U evaluation   Patient to call [facility] Management Center should patient have concerns prior to scheduled return appointment.Pain Management Discharge Instructions  General Discharge Instructions :  If you need to reach your doctor call: Monday-Friday 8:00 am - 4:00 pm at [phone] or toll free [phone].  After clinic hours [phone] to have operator reach doctor.  Bring all of your medication bottles to all your appointments in the pain clinic.  To cancel or reschedule your appointment with Pain Management please remember to call 24 hours in advance to avoid a fee.  Refer to the educational materials which you have been given on: General Risks, I had my Procedure. Discharge Instructions, Post Sedation.  Post Procedure Instructions:  The drugs you were given will stay in your system until tomorrow, so for the next 24 hours you should not drive, make any legal decisions or drink any alcoholic beverages.  You may eat anything you prefer, but it is better to start with liquids then soups and crackers, and gradually work up to solid foods.  Please notify your doctor immediately if you have any unusual bleeding, trouble breathing or pain that is not related to your normal pain.  Depending on the type of procedure that was done, some parts of  your body may feel week and/or numb.  This usually clears up by tonight or the next day.  Walk with the use of an assistive device or accompanied by an adult for the 24 hours.  You may use ice on the affected area for the first 24 hours.  Put ice in a Ziploc bag and cover with a towel and place against area 15 minutes on 15 minutes off.  You may switch to heat after 24 hours.

## 2015-04-24 ENCOUNTER — Encounter: Payer: Self-pay | Admitting: Nurse Practitioner

## 2015-05-07 ENCOUNTER — Encounter: Payer: Self-pay | Admitting: Obstetrics and Gynecology

## 2015-05-07 ENCOUNTER — Ambulatory Visit (INDEPENDENT_AMBULATORY_CARE_PROVIDER_SITE_OTHER): Payer: Medicare Other | Admitting: Obstetrics and Gynecology

## 2015-05-07 VITALS — BP 110/75 | HR 90 | Ht 61.0 in | Wt 169.9 lb

## 2015-05-07 DIAGNOSIS — R87610 Atypical squamous cells of undetermined significance on cytologic smear of cervix (ASC-US): Secondary | ICD-10-CM

## 2015-05-07 NOTE — Progress Notes (Signed)
GYN ENCOUNTER NOTE  Subjective:       Briana [name] is a 50 y.o. [id number] female is here for gynecologic evaluation of the following issues:  1. Abnormal Pap smear.  Ascus Pap smear 2  01/29/2015 ascus with NEGATIVE high risk HPV     04/06/2015 ascus with NEGATIVE high risk HPV     Gynecologic History Patient's last menstrual period was 08/25/2006.  Obstetric History OB History  Gravida Para Term Preterm AB SAB TAB Ectopic Multiple Living  3 1 1  2 1 1   1     # Outcome Date GA Lbr Len/2nd Weight Sex Delivery Anes PTL Lv  3 SAB 1998          2 TAB 1990          1 Term 1989    M Vag-Spont   Y      Past Medical History  Diagnosis Date  . GERD (gastroesophageal reflux disease)   . Fibromyalgia   . Joint disease   . Allergy   . Asthma   . Oxygen deficiency   . COPD (chronic obstructive pulmonary disease) ([place])   . Depression   . Chronic kidney disease   . Anxiety   . Arthritis   . Migraine   . Migraine   . Abnormal Pap smear of cervix     01/2015 ascus/neg- 04/2015 ascus/neg    Past Surgical History  Procedure Laterality Date  . Neck surgery      lower neck fusion rods and screws  . Laparoscopic oopherectomy Left     unsure which side but thinks its the left  . Endometrial ablation    . Carpal tunnel release Bilateral   . Spinal injections    . Shoulder arthroscopy with open rotator cuff repair and distal clavicle acrominectomy Right 09/03/2014    Procedure: RIGHT SHOULDER ARTHROSCOPY WITH MINI OPEN ROTATOR CUFF TEAR;  Surgeon: Thornton Park, MD;  Location: [facility] ORS;  Service: Orthopedics;  Laterality: Right;  biceps tenodesis, arthroscopic subacromial decompression and distal clavicle incision  . Abdominal hysterectomy  2008  . Cryotherapy      Current Outpatient Prescriptions on File Prior to Visit  Medication Sig Dispense Refill  . albuterol (ACCUNEB) 0.63 MG/3ML nebulizer solution Take 1 ampule by nebulization every 6 (six) hours as needed for wheezing.    .  chlorzoxazone (PARAFON) 500 MG tablet Limit 1 tab by mouth per day or twice a day to 3 times a day if tolerated 90 tablet 0  . cholecalciferol (VITAMIN D) 1000 UNITS tablet Take 2,000 Units by mouth daily.    . clindamycin (CLEOCIN T) 1 % external solution Apply topically daily.    Marland Kitchen EPINEPHrine (EPIPEN JR) 0.15 MG/0.3ML injection Inject 0.15 mg into the muscle as needed for anaphylaxis.    Marland Kitchen estrogen, conjugated,-medroxyprogesterone (PREMPRO) 0.3-1.5 MG per tablet Take 1 tablet by mouth daily.    . fluticasone (FLONASE) 50 MCG/ACT nasal spray Place into both nostrils daily.    Marland Kitchen lidocaine (LIDODERM) 5 % Apply 1-2 patches to skin for 12 hours then remove for 12 hours and repeat process if tolerated 60 patch 2  . lidocaine (XYLOCAINE) 5 % ointment Apply 1 application topically 4 (four) times daily as needed (apply 2-3 gms (2-3 inches ) to the affected areas 3-4 times daily).    . LORazepam (ATIVAN) 0.5 MG tablet Take 0.5 mg by mouth every 8 (eight) hours.    . magnesium oxide (MAG-OX) 400 MG tablet Take 400  mg by mouth daily.    . Multiple Vitamins-Minerals (HAIR SKIN AND NAILS FORMULA) TABS Take 2 tablets by mouth.    Marland Kitchen omeprazole (PRILOSEC) 20 MG capsule Take 20 mg by mouth daily.    . Oxycodone HCl 10 MG TABS Limit one half to 1 tab by mouth 3-5 times per day if tolerated ( No Tramadol) 140 tablet 0  . promethazine (PHENERGAN) 25 MG suppository Place 25 mg rectally every 6 (six) hours as needed for nausea or vomiting.    . tiotropium (SPIRIVA) 18 MCG inhalation capsule Place 18 mcg into inhaler and inhale daily.    Marland Kitchen topiramate (TOPAMAX) 100 MG tablet Take 100 mg by mouth 2 (two) times daily.    . vitamin E (VITAMIN E) 200 UNIT capsule Take 200 Units by mouth daily.     Current Facility-Administered Medications on File Prior to Visit  Medication Dose Route Frequency Provider Last Rate Last Dose  . fentaNYL (SUBLIMAZE) injection 100 mcg  100 mcg Intravenous Once Mohammed Kindle, MD      .  lactated ringers infusion 1,000 mL  1,000 mL Intravenous Continuous Mohammed Kindle, MD      . midazolam (VERSED) 5 MG/5ML injection 5 mg  5 mg Intravenous Once Mohammed Kindle, MD      . orphenadrine (NORFLEX) injection 60 mg  60 mg Intramuscular Once Mohammed Kindle, MD      . triamcinolone acetonide (KENALOG-40) injection 40 mg  40 mg Other Once Mohammed Kindle, MD        Allergies  Allergen Reactions  . Gadolinium Derivatives Swelling and Other (See Comments)    NECK BECAME RED AND TONGUE WAS SWELLING SLIGHTLY PER PT  . Cefuroxime Axetil Hives and Rash  . Cucumber Extract Rash  . Iodinated Diagnostic Agents Hives and Rash  . Latex Rash  . Other Hives and Rash    Bee Stings  . Tomato Rash    Red tomatoes    Social History   Social History  . Marital Status: Divorced    Spouse Name: N/A  . Number of Children: N/A  . Years of Education: N/A   Occupational History  . Not on file.   Social History Main Topics  . Smoking status: Current Every Day Smoker -- 1.00 packs/day    Types: Cigarettes  . Smokeless tobacco: Not on file  . Alcohol Use: No  . Drug Use: No  . Sexual Activity: Not Currently   Other Topics Concern  . Not on file   Social History Narrative    Family History  Problem Relation Age of Onset  . Cancer Mother   . Breast cancer Mother   . Cancer Father   . Diabetes Father   . Ovarian cancer Neg Hx     The following portions of the patient's history were reviewed and updated as appropriate: allergies, current medications, past family history, past medical history, past social history, past surgical history and problem list.  Review of Systems Review of Systems - General ROS: negative for - chills, fatigue, fever, hot flashes, malaise or night sweats Hematological and Lymphatic ROS: negative for - bleeding problems or swollen lymph nodes Gastrointestinal ROS: negative for - abdominal pain, blood in stools, change in bowel habits and  nausea/vomiting Musculoskeletal ROS: negative for - joint pain, muscle pain or muscular weakness Genito-Urinary ROS: negative for - change in menstrual cycle, dysmenorrhea, dyspareunia, dysuria, genital discharge, genital ulcers, hematuria, incontinence, irregular/heavy menses, nocturia or pelvic painjj  Objective:   BP  110/75 mmHg  Pulse 90  Ht 5\' 1"  (1.549 m)  Wt 169 lb 14.4 oz (77.066 kg)  BMI 32.12 kg/m2  LMP 08/25/2006 Physical exam-deferred   Assessment:   1. ASCUS Pap smear with NEGATIVE high risk HPV 2   Plan:   1. Recommend repeat Pap smear here in 1 year 2. Protocol for abnormal Pap smear management is reviewed with patient  A total of 15 minutes were spent face-to-face with the patient during this encounter and over half of that time dealt with counseling and coordination of care.  Brayton Mars, MD  Note: This dictation was prepared with Dragon dictation along with smaller phrase technology. Any transcriptional errors that result from this process are unintentional.

## 2015-05-13 DIAGNOSIS — R87610 Atypical squamous cells of undetermined significance on cytologic smear of cervix (ASC-US): Secondary | ICD-10-CM | POA: Insufficient documentation

## 2015-05-13 NOTE — Patient Instructions (Signed)
1.  Return in 1 year for Pap smear 

## 2015-05-14 ENCOUNTER — Encounter: Payer: Self-pay | Admitting: Pain Medicine

## 2015-05-14 ENCOUNTER — Ambulatory Visit: Payer: Medicare Other | Attending: Pain Medicine | Admitting: Pain Medicine

## 2015-05-14 VITALS — BP 94/68 | HR 83 | Temp 98.1°F | Resp 15 | Ht 61.0 in | Wt 168.0 lb

## 2015-05-14 DIAGNOSIS — M47814 Spondylosis without myelopathy or radiculopathy, thoracic region: Secondary | ICD-10-CM | POA: Diagnosis not present

## 2015-05-14 DIAGNOSIS — M533 Sacrococcygeal disorders, not elsewhere classified: Secondary | ICD-10-CM | POA: Insufficient documentation

## 2015-05-14 DIAGNOSIS — M170 Bilateral primary osteoarthritis of knee: Secondary | ICD-10-CM

## 2015-05-14 DIAGNOSIS — M5126 Other intervertebral disc displacement, lumbar region: Secondary | ICD-10-CM | POA: Insufficient documentation

## 2015-05-14 DIAGNOSIS — M4804 Spinal stenosis, thoracic region: Secondary | ICD-10-CM | POA: Diagnosis not present

## 2015-05-14 DIAGNOSIS — R51 Headache: Secondary | ICD-10-CM | POA: Insufficient documentation

## 2015-05-14 DIAGNOSIS — M47816 Spondylosis without myelopathy or radiculopathy, lumbar region: Secondary | ICD-10-CM

## 2015-05-14 DIAGNOSIS — M5481 Occipital neuralgia: Secondary | ICD-10-CM | POA: Diagnosis not present

## 2015-05-14 DIAGNOSIS — M7582 Other shoulder lesions, left shoulder: Secondary | ICD-10-CM | POA: Insufficient documentation

## 2015-05-14 DIAGNOSIS — M5134 Other intervertebral disc degeneration, thoracic region: Secondary | ICD-10-CM

## 2015-05-14 DIAGNOSIS — M19012 Primary osteoarthritis, left shoulder: Secondary | ICD-10-CM

## 2015-05-14 DIAGNOSIS — M2578 Osteophyte, vertebrae: Secondary | ICD-10-CM | POA: Diagnosis not present

## 2015-05-14 DIAGNOSIS — M542 Cervicalgia: Secondary | ICD-10-CM | POA: Diagnosis present

## 2015-05-14 DIAGNOSIS — Z9889 Other specified postprocedural states: Secondary | ICD-10-CM | POA: Diagnosis not present

## 2015-05-14 DIAGNOSIS — G2581 Restless legs syndrome: Secondary | ICD-10-CM

## 2015-05-14 DIAGNOSIS — M5030 Other cervical disc degeneration, unspecified cervical region: Secondary | ICD-10-CM

## 2015-05-14 DIAGNOSIS — M5136 Other intervertebral disc degeneration, lumbar region: Secondary | ICD-10-CM | POA: Diagnosis not present

## 2015-05-14 DIAGNOSIS — M47812 Spondylosis without myelopathy or radiculopathy, cervical region: Secondary | ICD-10-CM | POA: Insufficient documentation

## 2015-05-14 DIAGNOSIS — M546 Pain in thoracic spine: Secondary | ICD-10-CM | POA: Diagnosis present

## 2015-05-14 DIAGNOSIS — Z981 Arthrodesis status: Secondary | ICD-10-CM

## 2015-05-14 DIAGNOSIS — M50222 Other cervical disc displacement at C5-C6 level: Secondary | ICD-10-CM | POA: Diagnosis not present

## 2015-05-14 DIAGNOSIS — M19011 Primary osteoarthritis, right shoulder: Secondary | ICD-10-CM | POA: Insufficient documentation

## 2015-05-14 MED ORDER — CHLORZOXAZONE 500 MG PO TABS: ORAL_TABLET | ORAL | Status: DC

## 2015-05-14 MED ORDER — OXYCODONE HCL 10 MG PO TABS: ORAL_TABLET | ORAL | Status: DC

## 2015-05-14 NOTE — Patient Instructions (Addendum)
PLAN   Continue present medication Parafon forte  and oxycodone No Tramadol for now Please note that oxycodone is now twice is strong and can cause respiratory depression excessive sedation confusion and other side effects  F/U PCP  Humphrey Rolls for evaliation of  BP and general medical  Condition  F/U GYN as discussed  F/U surgical evaluation as discussed Patient will follow-up with Dr.Krasinski   F/U [facility] pod[facility]rist as discussed and visit she store as discussed  F/U neurological evaluation. May consider pending follow-up evaluatio  May consider radiofrequency rhizolysis or intraspinal procedures pending response to present treatment and F/U evaluation  Patient to call Pain Management Center should patient have concerns prior to scheduled return appointment.

## 2015-05-14 NOTE — Progress Notes (Signed)
Safety precautions to be maintained throughout the outpatient stay will include: orient to surroundings, keep bed in low position, maintain call bell within reach at all times, provide assistance with transfer out of bed and ambulation.  

## 2015-05-14 NOTE — Progress Notes (Signed)
Subjective:    Patient ID: Briana [name], female    DOB: Oct 10, 1965, 50 y.o.   MRN: [id number]  HPI  The patient is a 50 year old female who returns to pain management for further evaluation and treatment of pain involving the neck entire back upper and lower extremity region and headaches. Patient is with significant injury on the shoulder and will undergo follow-up evaluation of shoulder with Dr. Mack Guise as planned. The patient states the pain involving the lower back lower extremity region as well as the upper back pain and headaches as well as well-controlled at this time. We will continue present medication consisting of paraspinal for 2 and oxycodone and we will consider patient for additional modifications of treatment pending follow-up evaluation. All agreed to suggested treatment plan.    Review of Systems     Objective:   Physical Exam  There was tenderness of the splenius capitis and occipitalis musculature regions palpation which be produced pain of moderate degree with moderate tenderness over the cervical facet cervical paraspinal musculature region. Palpation of the acromial clavicular and glenohumeral joint region was with moderate to severe tenderness to palpation with limited range of motion of the shoulder noted. There was questionably decreased EHL strength and Tinel and Phalen's maneuver were without increased pain of significant degree. Palpation over the thoracic facet thoracic paraspinal musculature region reproduced pain with no crepitus of the thoracic region noted. There was tenderness over the lumbar paraspinal musculature region lumbar facet region palpation which reproduces pain of moderate degree with moderate tenderness of the lumbar facet lumbar paraspinal musculature region. There was moderate to moderately severe tenderness over the PSIS and PII S regions. There was mild tenderness of the greater trochanteric region iliotibial band region. No definite sensory  deficit or dermatomal dystrophy detected. There was negative clonus negative Homans. Abdomen nontender with no costovertebral tenderness noted. The predominant portion of patient's pain was reproduced with palpation over the PSIS and PII S regions abdomen soft nontender with no costovertebral tenderness noted      Assessment & Plan:       Bilateral occipital neuralgia  Degenerative disc disease of the lumbar spine  Lumbar facet syndrome  Degenerative disc disease cervical spine Central spondylosis C5-C6 small central disc protrusion and mild impression upon the ventral thecal sac, C6-7 mild broad-based disc osteophyte complex more focal centrally and impression upon the ventral thecal sac, C4-5 mild right foraminal stenosis degenerative changes  Thoracic degenerative disc disease T9-10, T10-11, T11-12, degenerative changes with central canal stenosis  Degenerative disc disease lumbar spine  Degenerative disc disease lumbar spine T12-L1, L1-2, L2-3,, L3-4, L4-5, L5-S1 with paracentral disc bulging partial effacement of the anterior cerebrospinal fluid space  Degenerative joint disease of shouldersStatus post surgery of the right shoulder  MRI of left shoulder with severe tendinosis of the supraspinatus tendon with a small partial thickness bursal surface tear of the posterior fibers.  Thoracic facet syndrome  Sacroiliac joint dysfunction  Degenerative joint disease of shoulders     PLAN   Continue present medication Parafon forte  and oxycodone No Tramadol for now Please note that oxycodone is now twice is strong and can cause respiratory depression excessive sedation confusion and other side effects  F/U PCP  Humphrey Rolls for evaliation of  BP and general medical  Condition  F/U GYN as discussed  F/U surgical evaluation as discussed Patient will follow-up with Dr.Krasinski   F/U [facility] pod[facility] as discussed and visit she store as discussed  F/U  neurological  evaluation. May consider pending follow-up evaluatio  May consider radiofrequency rhizolysis or intraspinal procedures pending response to present treatment and F/U evaluation  Patient to call Pain Management Center should patient have concerns prior to scheduled return appointment.

## 2015-06-10 ENCOUNTER — Encounter: Payer: Self-pay | Admitting: Pain Medicine

## 2015-06-10 ENCOUNTER — Ambulatory Visit: Payer: Medicare Other | Attending: Pain Medicine | Admitting: Pain Medicine

## 2015-06-10 VITALS — BP 98/63 | HR 94 | Temp 97.7°F | Resp 16 | Ht 61.0 in | Wt 168.0 lb

## 2015-06-10 DIAGNOSIS — M5481 Occipital neuralgia: Secondary | ICD-10-CM | POA: Insufficient documentation

## 2015-06-10 DIAGNOSIS — M4804 Spinal stenosis, thoracic region: Secondary | ICD-10-CM | POA: Diagnosis not present

## 2015-06-10 DIAGNOSIS — M5134 Other intervertebral disc degeneration, thoracic region: Secondary | ICD-10-CM

## 2015-06-10 DIAGNOSIS — M4802 Spinal stenosis, cervical region: Secondary | ICD-10-CM | POA: Diagnosis not present

## 2015-06-10 DIAGNOSIS — M5030 Other cervical disc degeneration, unspecified cervical region: Secondary | ICD-10-CM | POA: Insufficient documentation

## 2015-06-10 DIAGNOSIS — M7591 Shoulder lesion, unspecified, right shoulder: Secondary | ICD-10-CM | POA: Diagnosis not present

## 2015-06-10 DIAGNOSIS — M5136 Other intervertebral disc degeneration, lumbar region: Secondary | ICD-10-CM | POA: Diagnosis not present

## 2015-06-10 DIAGNOSIS — M542 Cervicalgia: Secondary | ICD-10-CM | POA: Diagnosis present

## 2015-06-10 DIAGNOSIS — G2581 Restless legs syndrome: Secondary | ICD-10-CM

## 2015-06-10 DIAGNOSIS — M19012 Primary osteoarthritis, left shoulder: Secondary | ICD-10-CM | POA: Insufficient documentation

## 2015-06-10 DIAGNOSIS — M533 Sacrococcygeal disorders, not elsewhere classified: Secondary | ICD-10-CM | POA: Diagnosis not present

## 2015-06-10 DIAGNOSIS — M170 Bilateral primary osteoarthritis of knee: Secondary | ICD-10-CM

## 2015-06-10 DIAGNOSIS — M2578 Osteophyte, vertebrae: Secondary | ICD-10-CM | POA: Diagnosis not present

## 2015-06-10 DIAGNOSIS — M50223 Other cervical disc displacement at C6-C7 level: Secondary | ICD-10-CM | POA: Diagnosis not present

## 2015-06-10 DIAGNOSIS — M19011 Primary osteoarthritis, right shoulder: Secondary | ICD-10-CM | POA: Insufficient documentation

## 2015-06-10 DIAGNOSIS — M25512 Pain in left shoulder: Secondary | ICD-10-CM | POA: Diagnosis present

## 2015-06-10 DIAGNOSIS — M47816 Spondylosis without myelopathy or radiculopathy, lumbar region: Secondary | ICD-10-CM

## 2015-06-10 DIAGNOSIS — Z981 Arthrodesis status: Secondary | ICD-10-CM

## 2015-06-10 DIAGNOSIS — M47812 Spondylosis without myelopathy or radiculopathy, cervical region: Secondary | ICD-10-CM | POA: Insufficient documentation

## 2015-06-10 DIAGNOSIS — M47814 Spondylosis without myelopathy or radiculopathy, thoracic region: Secondary | ICD-10-CM | POA: Insufficient documentation

## 2015-06-10 DIAGNOSIS — M5126 Other intervertebral disc displacement, lumbar region: Secondary | ICD-10-CM | POA: Insufficient documentation

## 2015-06-10 MED ORDER — OXYCODONE HCL 10 MG PO TABS: ORAL_TABLET | ORAL | Status: DC

## 2015-06-10 MED ORDER — CHLORZOXAZONE 500 MG PO TABS: ORAL_TABLET | ORAL | Status: DC

## 2015-06-10 NOTE — Progress Notes (Signed)
Subjective:    Patient ID: Briana [name], female    DOB: July 04, 1965, 50 y.o.   MRN: [id number]  HPI  The patient is a 50 year old female who returns to pain management for further evaluation and treatment of pain involving the neck shoulders especially the left shoulder as well as the mid back lower back and lower extremity region. The patient is undergo follow-up evaluation with Dr. Mack Guise with surgical intervention of the left shoulder being considered. The patient states that she has return of pain involving the lower back and mid back region is well. At the present time we will continue patient's medications consisting of Neurontin for take and oxycodone. We have discussed interventional treatment which we will avoid at this time and patient will follow-up with Dr. Mack Guise regarding her pain of the left shoulder with consideration being given to surgical intervention. The patient states that pain is aggravated with region lifting pushing pulling and that pain is present throughout the day and increased with activities. We will consider modification of treatment regimen and we'll consider interventional treatment should patient have severe disabling pain of the thoracic oh lumbar lower extremity region or cervical region and headaches. The patient will call pain management should she have change in condition prior to scheduled return appointment. All in agreement with suggested treatment plan.       Review of Systems     Objective:   Physical Exam   There was tenderness over the cervical paraspinal muscular region cervical facet region of mild to moderate degree with palpation over the splenius capitis and occipitalis region reproducing mild to moderate discomfort. Palpation over the region of the acromioclavicular and glenohumeral joint regions reproduced severe pain on the left compared to the right with limited range of motion of the shoulder. There was difficulty performing drop  test. The patient was with decreased grip strength and Tinel and Phalen's maneuver were without increased pain of severe degree. Palpation over the region of the thoracic region thoracic facet region was attends to palpation of mild to moderate degree with no crepitus of the thoracic region noted. Palpation of the lumbar region lumbar facet region was of increased pain of moderate degree with lateral bending rotation extension and palpation of the lumbar facets reproducing moderate discomfort. There was no sensory deficit or dermatomal distribution detected. There was moderate tenderness of the greater trochanteric region iliotibial band region is well palpation over the PSIS and PII S region reproduced moderate to moderately severe discomfort. Straight leg raising was tolerated to approximatel  30 without increased pain with dorsiflexion noted. EHL strength appeared to be slightly decreased. There was negative clonus negative Homans. 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  Thoracic facet syndrome  Sacroiliac joint dysfunction  Degenerative joint disease of shoulders       PLAN   Continue present medication Parafon forte  and oxycodone No Tramadol for now Please note that oxycodone is now twice is strong and can cause respiratory depression excessive sedation confusion and other side effects  F/U PCP  Humphrey Rolls for evaliation of  BP and general medical  Condition  F/U GYN as discussed  F/U surgical evaluation as discussed Patient will follow-up with Dr.Krasinski   F/U [facility] as discussed and visit she store as discussed  F/U neurological evaluation. May consider pending follow-up evaluatio  May consider radiofrequency rhizolysis or intraspinal procedures pending response to present treatment and F/U evaluation  Patient to call Pain Management Center should patient have concerns prior to scheduled return appointment.

## 2015-06-10 NOTE — Patient Instructions (Addendum)
PLAN   Continue present medication Parafon forte  and oxycodone No Tramadol for now Please note that oxycodone is now twice is strong and can cause respiratory depression excessive sedation confusion and other side effects  F/U PCP  Humphrey Rolls for evaliation of  BP and general medical  Condition  F/U GYN as discussed  F/U surgical evaluation as discussed Patient will follow-up with Dr.Krasinski   F/U [facility] pod[facility] as discussed and visit she store as discussed  F/U neurological evaluation. May consider pending follow-up evaluatio  May consider radiofrequency rhizolysis or intraspinal procedures pending response to present treatment and F/U evaluation  Patient to call Pain Management Center should patient have concerns prior to scheduled return appointment.GENERAL RISKS AND COMPLICATIONS  What are the risk, side effects and possible complications? Generally speaking, most procedures are safe.  However, with any procedure there are risks, side effects, and the possibility of complications.  The risks and complications are dependent upon the sites that are lesioned, or the type of nerve block to be performed.  The closer the procedure is to the spine, the more serious the risks are.  Great care is taken when placing the radio frequency needles, block needles or lesioning probes, but sometimes complications can occur. 1. Infection: Any time there is an injection through the skin, there is a risk of infection.  This is why sterile conditions are used for these blocks.  There are four possible types of infection. 1. Localized skin infection. 2. Central Nervous System Infection-This can be in the form of Meningitis, which can be deadly. 3. Epidural Infections-This can be in the form of an epidural abscess, which can cause pressure inside of the spine, causing compression of the spinal cord with subsequent paralysis. This would require an emergency surgery to decompress, and there are no guarantees  that the patient would recover from the paralysis. 4. Discitis-This is an infection of the intervertebral discs.  It occurs in about 1% of discography procedures.  It is difficult to treat and it may lead to surgery.        2. Pain: the needles have to go through skin and soft tissues, will cause soreness.       3. Damage to internal structures:  The nerves to be lesioned may be near blood vessels or    other nerves which can be potentially damaged.       4. Bleeding: Bleeding is more common if the patient is taking blood thinners such as  aspirin, Coumadin, Ticiid, Plavix, etc., or if he/she have some genetic predisposition  such as hemophilia. Bleeding into the spinal canal can cause compression of the spinal  cord with subsequent paralysis.  This would require an emergency surgery to  decompress and there are no guarantees that the patient would recover from the  paralysis.       5. Pneumothorax:  Puncturing of a lung is a possibility, every time a needle is introduced in  the area of the chest or upper back.  Pneumothorax refers to free air around the  collapsed lung(s), inside of the thoracic cavity (chest cavity).  Another two possible  complications related to a similar event would include: Hemothorax and Chylothorax.   These are variations of the Pneumothorax, where instead of air around the collapsed  lung(s), you may have blood or chyle, respectively.       6. Spinal headaches: They may occur with any procedures in the area of the spine.  7. Persistent CSF (Cerebro-Spinal Fluid) leakage: This is a rare problem, but may occur  with prolonged intrathecal or epidural catheters either due to the formation of a fistulous  track or a dural tear.       8. Nerve damage: By working so close to the spinal cord, there is always a possibility of  nerve damage, which could be as serious as a permanent spinal cord injury with  paralysis.       9. Death:  Although rare, severe deadly allergic reactions  known as "Anaphylactic  reaction" can occur to any of the medications used.      10. Worsening of the symptoms:  We can always make thing worse.  What are the chances of something like this happening? Chances of any of this occuring are extremely low.  By statistics, you have more of a chance of getting killed in a motor vehicle accident: while driving to the hospital than any of the above occurring .  Nevertheless, you should be aware that they are possibilities.  In general, it is similar to taking a shower.  Everybody knows that you can slip, hit your head and get killed.  Does that mean that you should not shower again?  Nevertheless always keep in mind that statistics do not mean anything if you happen to be on the wrong side of them.  Even if a procedure has a 1 (one) in a 1,000,000 (million) chance of going wrong, it you happen to be that one..Also, keep in mind that by statistics, you have more of a chance of having something go wrong when taking medications.  Who should not have this procedure? If you are on a blood thinning medication (e.g. Coumadin, Plavix, see list of "Blood Thinners"), or if you have an active infection going on, you should not have the procedure.  If you are taking any blood thinners, please inform your physician.  How should I prepare for this procedure?  Do not eat or drink anything at least six hours prior to the procedure.  Bring a driver with you .  It cannot be a taxi.  Come accompanied by an adult that can drive you back, and that is strong enough to help you if your legs get weak or numb from the local anesthetic.  Take all of your medicines the morning of the procedure with just enough water to swallow them.  If you have diabetes, make sure that you are scheduled to have your procedure done first thing in the morning, whenever possible.  If you have diabetes, take only half of your insulin dose and notify our nurse that you have done so as soon as you  arrive at the clinic.  If you are diabetic, but only take blood sugar pills (oral hypoglycemic), then do not take them on the morning of your procedure.  You may take them after you have had the procedure.  Do not take aspirin or any aspirin-containing medications, at least eleven (11) days prior to the procedure.  They may prolong bleeding.  Wear loose fitting clothing that may be easy to take off and that you would not mind if it got stained with Betadine or blood.  Do not wear any jewelry or perfume  Remove any nail coloring.  It will interfere with some of our monitoring equipment.  NOTE: Remember that this is not meant to be interpreted as a complete list of all possible complications.  Unforeseen problems may occur.  BLOOD THINNERS  The following drugs contain aspirin or other products, which can cause increased bleeding during surgery and should not be taken for 2 weeks prior to and 1 week after surgery.  If you should need take something for relief of minor pain, you may take acetaminophen which is found in Tylenol,m Datril, Anacin-3 and Panadol. It is not blood thinner. The products listed below are.  Do not take any of the products listed below in addition to any listed on your instruction sheet.  A.P.C or A.P.C with Codeine Codeine Phosphate Capsules #3 Ibuprofen Ridaura  ABC compound Congesprin Imuran rimadil  Advil Cope Indocin Robaxisal  Alka-Seltzer Effervescent Pain Reliever and Antacid Coricidin or Coricidin-D  Indomethacin Rufen  Alka-Seltzer plus Cold Medicine Cosprin Ketoprofen S-A-C Tablets  Anacin Analgesic Tablets or Capsules Coumadin Korlgesic Salflex  Anacin Extra Strength Analgesic tablets or capsules CP-2 Tablets Lanoril Salicylate  Anaprox Cuprimine Capsules Levenox Salocol  Anexsia-D Dalteparin Magan Salsalate  Anodynos Darvon compound Magnesium Salicylate Sine-off  Ansaid Dasin Capsules Magsal Sodium Salicylate  Anturane Depen Capsules Marnal Soma  APF  Arthritis pain formula Dewitt's Pills Measurin Stanback  Argesic Dia-Gesic Meclofenamic Sulfinpyrazone  Arthritis Bayer Timed Release Aspirin Diclofenac Meclomen Sulindac  Arthritis pain formula Anacin Dicumarol Medipren Supac  Analgesic (Safety coated) Arthralgen Diffunasal Mefanamic Suprofen  Arthritis Strength Bufferin Dihydrocodeine Mepro Compound Suprol  Arthropan liquid Dopirydamole Methcarbomol with Aspirin Synalgos  ASA tablets/Enseals Disalcid Micrainin Tagament  Ascriptin Doan's Midol Talwin  Ascriptin A/D Dolene Mobidin Tanderil  Ascriptin Extra Strength Dolobid Moblgesic Ticlid  Ascriptin with Codeine Doloprin or Doloprin with Codeine Momentum Tolectin  Asperbuf Duoprin Mono-gesic Trendar  Aspergum Duradyne Motrin or Motrin IB Triminicin  Aspirin plain, buffered or enteric coated Durasal Myochrisine Trigesic  Aspirin Suppositories Easprin Nalfon Trillsate  Aspirin with Codeine Ecotrin Regular or Extra Strength Naprosyn Uracel  Atromid-S Efficin Naproxen Ursinus  Auranofin Capsules Elmiron Neocylate Vanquish  Axotal Emagrin Norgesic Verin  Azathioprine Empirin or Empirin with Codeine Normiflo Vitamin E  Azolid Emprazil Nuprin Voltaren  Bayer Aspirin plain, buffered or children's or timed BC Tablets or powders Encaprin Orgaran Warfarin Sodium  Buff-a-Comp Enoxaparin Orudis Zorpin  Buff-a-Comp with Codeine Equegesic Os-Cal-Gesic   Buffaprin Excedrin plain, buffered or Extra Strength Oxalid   Bufferin Arthritis Strength Feldene Oxphenbutazone   Bufferin plain or Extra Strength Feldene Capsules Oxycodone with Aspirin   Bufferin with Codeine Fenoprofen Fenoprofen Pabalate or Pabalate-SF   Buffets II Flogesic Panagesic   Buffinol plain or Extra Strength Florinal or Florinal with Codeine Panwarfarin   Buf-Tabs Flurbiprofen Penicillamine   Butalbital Compound Four-way cold tablets Penicillin   Butazolidin Fragmin Pepto-Bismol   Carbenicillin Geminisyn Percodan   Carna Arthritis  Reliever Geopen Persantine   Carprofen Gold's salt Persistin   Chloramphenicol Goody's Phenylbutazone   Chloromycetin Haltrain Piroxlcam   Clmetidine heparin Plaquenil   Cllnoril Hyco-pap Ponstel   Clofibrate Hydroxy chloroquine Propoxyphen         Before stopping any of these medications, be sure to consult the physician who ordered them.  Some, such as Coumadin (Warfarin) are ordered to prevent or treat serious conditions such as "deep thrombosis", "pumonary embolisms", and other heart problems.  The amount of time that you may need off of the medication may also vary with the medication and the reason for which you were taking it.  If you are taking any of these medications, please make sure you notify your pain physician before you undergo any procedures.

## 2015-06-10 NOTE — Progress Notes (Signed)
Safety precautions to be maintained throughout the outpatient stay will include: orient to surroundings, keep bed in low position, maintain call bell within reach at all times, provide assistance with transfer out of bed and ambulation.  

## 2015-06-20 LAB — TOXASSURE SELECT 13 (MW), URINE

## 2015-06-22 NOTE — Progress Notes (Signed)
Quick Note:  Reviewed. ______ 

## 2015-07-07 ENCOUNTER — Other Ambulatory Visit: Payer: Self-pay | Admitting: Pain Medicine

## 2015-07-07 DIAGNOSIS — M5030 Other cervical disc degeneration, unspecified cervical region: Secondary | ICD-10-CM

## 2015-07-07 DIAGNOSIS — M19011 Primary osteoarthritis, right shoulder: Secondary | ICD-10-CM

## 2015-07-07 DIAGNOSIS — M5136 Other intervertebral disc degeneration, lumbar region: Secondary | ICD-10-CM

## 2015-07-07 DIAGNOSIS — G2581 Restless legs syndrome: Secondary | ICD-10-CM

## 2015-07-07 DIAGNOSIS — M19012 Primary osteoarthritis, left shoulder: Secondary | ICD-10-CM

## 2015-07-07 DIAGNOSIS — M5481 Occipital neuralgia: Secondary | ICD-10-CM

## 2015-07-07 DIAGNOSIS — M5134 Other intervertebral disc degeneration, thoracic region: Secondary | ICD-10-CM

## 2015-07-07 DIAGNOSIS — M533 Sacrococcygeal disorders, not elsewhere classified: Secondary | ICD-10-CM

## 2015-07-07 DIAGNOSIS — M170 Bilateral primary osteoarthritis of knee: Secondary | ICD-10-CM

## 2015-07-07 DIAGNOSIS — M47816 Spondylosis without myelopathy or radiculopathy, lumbar region: Secondary | ICD-10-CM

## 2015-07-07 DIAGNOSIS — Z981 Arthrodesis status: Secondary | ICD-10-CM

## 2015-07-08 ENCOUNTER — Ambulatory Visit: Payer: Medicare Other | Attending: Pain Medicine | Admitting: Pain Medicine

## 2015-07-08 ENCOUNTER — Encounter: Payer: Self-pay | Admitting: Pain Medicine

## 2015-07-08 VITALS — BP 104/68 | HR 78 | Temp 97.5°F | Resp 20 | Ht 61.0 in | Wt 164.0 lb

## 2015-07-08 DIAGNOSIS — M19011 Primary osteoarthritis, right shoulder: Secondary | ICD-10-CM

## 2015-07-08 DIAGNOSIS — M5030 Other cervical disc degeneration, unspecified cervical region: Secondary | ICD-10-CM

## 2015-07-08 DIAGNOSIS — M5126 Other intervertebral disc displacement, lumbar region: Secondary | ICD-10-CM | POA: Insufficient documentation

## 2015-07-08 DIAGNOSIS — M47816 Spondylosis without myelopathy or radiculopathy, lumbar region: Secondary | ICD-10-CM

## 2015-07-08 DIAGNOSIS — M533 Sacrococcygeal disorders, not elsewhere classified: Secondary | ICD-10-CM

## 2015-07-08 DIAGNOSIS — M545 Low back pain: Secondary | ICD-10-CM | POA: Diagnosis present

## 2015-07-08 DIAGNOSIS — M5136 Other intervertebral disc degeneration, lumbar region: Secondary | ICD-10-CM | POA: Diagnosis not present

## 2015-07-08 DIAGNOSIS — M19012 Primary osteoarthritis, left shoulder: Secondary | ICD-10-CM

## 2015-07-08 DIAGNOSIS — M5134 Other intervertebral disc degeneration, thoracic region: Secondary | ICD-10-CM

## 2015-07-08 DIAGNOSIS — G2581 Restless legs syndrome: Secondary | ICD-10-CM

## 2015-07-08 DIAGNOSIS — M5481 Occipital neuralgia: Secondary | ICD-10-CM

## 2015-07-08 DIAGNOSIS — Z981 Arthrodesis status: Secondary | ICD-10-CM

## 2015-07-08 DIAGNOSIS — M170 Bilateral primary osteoarthritis of knee: Secondary | ICD-10-CM

## 2015-07-08 DIAGNOSIS — M79606 Pain in leg, unspecified: Secondary | ICD-10-CM | POA: Diagnosis present

## 2015-07-08 MED ORDER — MIDAZOLAM HCL 5 MG/5ML IJ SOLN
5.0000 mg | Freq: Once | INTRAMUSCULAR | Status: AC
  Administered 2015-07-08: 5 mg via INTRAVENOUS
  Filled 2015-07-08: qty 5

## 2015-07-08 MED ORDER — CIPROFLOXACIN IN D5W 400 MG/200ML IV SOLN
400.0000 mg | Freq: Once | INTRAVENOUS | Status: AC
  Administered 2015-07-08: 400 mg via INTRAVENOUS
  Filled 2015-07-08: qty 200

## 2015-07-08 MED ORDER — BUPIVACAINE HCL (PF) 0.25 % IJ SOLN
30.0000 mL | Freq: Once | INTRAMUSCULAR | Status: AC
  Administered 2015-07-08: 30 mL
  Filled 2015-07-08: qty 30

## 2015-07-08 MED ORDER — CIPROFLOXACIN HCL 500 MG PO TABS: ORAL_TABLET | ORAL | Status: DC

## 2015-07-08 MED ORDER — CHLORZOXAZONE 500 MG PO TABS: ORAL_TABLET | ORAL | Status: DC

## 2015-07-08 MED ORDER — FENTANYL CITRATE (PF) 100 MCG/2ML IJ SOLN
100.0000 ug | Freq: Once | INTRAMUSCULAR | Status: AC
  Administered 2015-07-08: 100 ug via INTRAVENOUS
  Filled 2015-07-08: qty 2

## 2015-07-08 MED ORDER — ORPHENADRINE CITRATE 30 MG/ML IJ SOLN
60.0000 mg | Freq: Once | INTRAMUSCULAR | Status: AC
  Administered 2015-07-08: 60 mg via INTRAMUSCULAR
  Filled 2015-07-08: qty 2

## 2015-07-08 MED ORDER — OXYCODONE HCL 10 MG PO TABS: ORAL_TABLET | ORAL | Status: DC

## 2015-07-08 MED ORDER — LACTATED RINGERS IV SOLN: 1000.0000 mL | INTRAVENOUS | Status: DC

## 2015-07-08 MED ORDER — TRIAMCINOLONE ACETONIDE 40 MG/ML IJ SUSP
40.0000 mg | Freq: Once | INTRAMUSCULAR | Status: AC
  Administered 2015-07-08: 40 mg
  Filled 2015-07-08: qty 1

## 2015-07-08 NOTE — Patient Instructions (Addendum)
PLAN   Continue present medication Parafon forte  and oxycodone No Tramadol  Please note that oxycodone is now twice is strong and can cause respiratory depression excessive sedation confusion and other side effects Please obtain Cipro antibiotic today and begin taking Cipro antibiotic today as prescribed  F/U PCP  Humphrey Rolls for evaliation of  BP and general medical condition  F/U GYN as discussed  F/U surgical evaluation as discussed Patient will follow-up with Dr.Krasinski   F/U [facility] pod[facility]rist as discussed and visit she store as discussed  F/U neurological evaluation. May consider pending follow-up evaluation  May consider radiofrequency rhizolysis or intraspinal procedures pending response to present treatment and F/U evaluation  Patient to call Pain Management Center should patient have concerns prior to scheduled return appointment.Pain Management Discharge Instructions  General Discharge Instructions :  If you need to reach your doctor call: Monday-Friday 8:00 am - 4:00 pm at [phone] or toll free [phone].  After clinic hours [phone] to have operator reach doctor.  Bring all of your medication bottles to all your appointments in the pain clinic.  To cancel or reschedule your appointment with Pain Management please remember to call 24 hours in advance to avoid a fee.  Refer to the educational materials which you have been given on: General Risks, I had my Procedure. Discharge Instructions, Post Sedation.  Post Procedure Instructions:  The drugs you were given will stay in your system until tomorrow, so for the next 24 hours you should not drive, make any legal decisions or drink any alcoholic beverages.  You may eat anything you prefer, but it is better to start with liquids then soups and crackers, and gradually work up to solid foods.  Please notify your doctor immediately if you have any unusual bleeding, trouble breathing or pain that is not related to  your normal pain.  Depending on the type of procedure that was done, some parts of your body may feel week and/or numb.  This usually clears up by tonight or the next day.  Walk with the use of an assistive device or accompanied by an adult for the 24 hours.  You may use ice on the affected area for the first 24 hours.  Put ice in a Ziploc bag and cover with a towel and place against area 15 minutes on 15 minutes off.  You may switch to heat after 24 hours.Facet Blocks Patient Information  Description: The facets are joints in the spine between the vertebrae.  Like any joints in the body, facets can become irritated and painful.  Arthritis can also effect the facets.  By injecting steroids and local anesthetic in and around these joints, we can temporarily block the nerve supply to them.  Steroids act directly on irritated nerves and tissues to reduce selling and inflammation which often leads to decreased pain.  Facet blocks may be done anywhere along the spine from the neck to the low back depending upon the location of your pain.   After numbing the skin with local anesthetic (like Novocaine), a small needle is passed onto the facet joints under x-ray guidance.  You may experience a sensation of pressure while this is being done.  The entire block usually lasts about 15-25 minutes.   Conditions which may be treated by facet blocks:   Low back/buttock pain  Neck/shoulder pain  Certain types of headaches  Preparation for the injection:  1. Do not eat any solid food or dairy products within 8 hours of  your appointment. 2. You may drink clear liquid up to 3 hours before appointment.  Clear liquids include water, black coffee, juice or soda.  No milk or cream please. 3. You may take your regular medication, including pain medications, with a sip of water before your appointment.  Diabetics should hold regular insulin (if taken separately) and take 1/2 normal NPH dose the morning of the  procedure.  Carry some sugar containing items with you to your appointment. 4. A driver must accompany you and be prepared to drive you home after your procedure. 5. Bring all your current medications with you. 6. An IV may be inserted and sedation may be given at the discretion of the physician. 7. A blood pressure cuff, EKG and other monitors will often be applied during the procedure.  Some patients may need to have extra oxygen administered for a short period. 8. You will be asked to provide medical information, including your allergies and medications, prior to the procedure.  We must know immediately if you are taking blood thinners (like Coumadin/Warfarin) or if you are allergic to IV iodine contrast (dye).  We must know if you could possible be pregnant.  Possible side-effects:   Bleeding from needle site  Infection (rare, may require surgery)  Nerve injury (rare)  Numbness & tingling (temporary)  Difficulty urinating (rare, temporary)  Spinal headache (a headache worse with upright posture)  Light-headedness (temporary)  Pain at injection site (serveral days)  Decreased blood pressure (rare, temporary)  Weakness in arm/leg (temporary)  Pressure sensation in back/neck (temporary)   Call if you experience:   Fever/chills associated with headache or increased back/neck pain  Headache worsened by an upright position  New onset, weakness or numbness of an extremity below the injection site  Hives or difficulty breathing (go to the emergency room)  Inflammation or drainage at the injection site(s)  Severe back/neck pain greater than usual  New symptoms which are concerning to you  Please note:  Although the local anesthetic injected can often make your back or neck feel good for several hours after the injection, the pain will likely return. It takes 3-7 days for steroids to work.  You may not notice any pain relief for at least one week.  If effective, we  will often do a series of 2-3 injections spaced 3-6 weeks apart to maximally decrease your pain.  After the initial series, you may be a candidate for a more permanent nerve block of the facets.  If you have any questions, please call #[phone] [place] antibiotic sent to pharmacy.  Script in hand for oxycodone x 1 and Parafon x 1

## 2015-07-08 NOTE — Progress Notes (Signed)
Patient here for procedure today d/t lower back pain.  Patient also states that she needs her medications refilled, oxycodone and antibiotic Safety precautions to be maintained throughout the outpatient stay will include: orient to surroundings, keep bed in low position, maintain call bell within reach at all times, provide assistance with transfer out of bed and ambulation.

## 2015-07-08 NOTE — Progress Notes (Signed)
Subjective:    Patient ID: Briana [name], female    DOB: 1965/06/16, 50 y.o.   MRN: [id number]  HPI  PROCEDURE PERFORMED: Lumbar facet (medial branch block)   NOTE: The patient is a 50 y.o. female who returns to [place] for further evaluation and treatment of pain involving the lumbar and lower extremity region. MRI  revealed the patient to be with evidence of degenerative disc disease lumbar spine withT12-L1, L1-2, L2-3,, L3-4, L4-5, L5-S1 with paracentral disc bulging partial effacement of the anterior cerebrospinal fluid space. There is concern regarding significant component of patient's pain began due to lumbar facet syndrome The risks, benefits, and expectations of the procedure have been discussed and explained to the patient who was understanding and in agreement with suggested treatment plan. We will proceed with interventional treatment as discussed and as explained to the patient who was understanding and wished to proceed with procedure as planned.   DESCRIPTION OF PROCEDURE: Lumbar facet (medial branch block) with IV Versed, IV fentanyl conscious sedation, EKG, blood pressure, pulse, capnography, and pulse oximetry monitoring. The procedure was performed with the patient in the prone position. Betadine prep of proposed entry site performed.   NEEDLE PLACEMENT AT: Left  L 2 lumbar facet (medial branch block). Under fluoroscopic guidance with oblique orientation of 15 degrees, a 22-gauge needle was inserted at the L 2 vertebral body level with needle placed at the targeted area of Burton's Eye or Eye of the Scotty Dog with documentation of needle placement in the superior and lateral border of targeted area of Burton's Eye or Eye of the Scotty Dog with oblique orientation of 15 degrees. Following documentation of needle placement at the L 2 vertebral body level, needle placement was then accomplished at the L 3 vertebral body level.   NEEDLE PLACEMENT AT L3, L4, and L5  VERTEBRAL BODY LEVELS ON THE LEFT SIDE The procedure was performed at the L3, L4, and L5 vertebral body levels exactly as was performed at the L 2 vertebral body level utilizing the same technique and under fluoroscopic guidance.  NEEDLE PLACEMENT AT THE SACRAL ALA with AP view of the lumbosacral spine. With the patient in the prone position, Betadine prep of proposed entry site accomplished, a 22 gauge needle was inserted in the region of the sacral ala (groove formed by the superior articulating process of S1 and the sacral wing). Following documentation of needle placement at the sacral ala,     Needle placement was then verified at all levels on lateral view. Following documentation of needle placement at all levels on lateral view and following negative aspiration for heme and CSF, each level was injected with 1 mL of 0.25% bupivacaine with Kenalog.     LUMBAR FACET, MEDIAL BRANCH NERVE, BLOCKS PERFORMED ON THE RIGHT SIDE   The procedure was performed on the right side exactly as was performed on the left side at the same levels and utilizing the same technique under fluoroscopic guidance  Myoneural block injections of the lumbar paraspinal musculature region Following Betadine prep of proposed entry site a 22-gauge needle was inserted into the gluteal musculature region and following negative aspiration 1 cc of 0.25% bupivacaine with Norflex was injected for myoneural block injection of the lumbar paraspinal musculature region 4.     The patient tolerated the procedure well  . A total of 40 mg of Kenalog was utilized for the procedure.   PLAN:  1. Medications: The patient will continue presently prescribed medications  Parafon Forte a and oxycodone 2. May consider modification of treatment regimen at time of return appointment pending response to treatment rendered on today's visit. 3. The patient is to follow-up with primary care physician Dr. Humphrey Rolls for further evaluation of blood  pressure and general medical condition status post steroid injection performed on today's visit. 4. Surgical follow-up evaluation as planned. 5. Neurological follow-up evaluation. May consider [facility] EMG studies and other studies 6. The patient may be candidate for radiofrequency procedures, implantation type procedures, and other treatment pending response to treatment and follow-up evaluation. 7. The patient has been advised to call the [facility] prior to scheduled return appointment should there be significant change in condition or should patient have other concerns regarding condition prior to scheduled return appointment.  The patient is understanding and in agreement with suggested treatment plan.   Review of Systems     Objective:   Physical Exam        Assessment & Plan:

## 2015-07-09 ENCOUNTER — Telehealth: Payer: Self-pay | Admitting: *Deleted

## 2015-07-09 NOTE — Telephone Encounter (Signed)
Left voicemail for patient to call our office if there are questions or concerns re; procedure on yesterday.  

## 2015-08-11 ENCOUNTER — Ambulatory Visit: Payer: Medicare Other | Attending: Pain Medicine | Admitting: Pain Medicine

## 2015-08-11 ENCOUNTER — Encounter: Payer: Self-pay | Admitting: Pain Medicine

## 2015-08-11 VITALS — BP 111/74 | HR 88 | Temp 98.4°F | Resp 18 | Ht 61.0 in | Wt 154.0 lb

## 2015-08-11 DIAGNOSIS — M19011 Primary osteoarthritis, right shoulder: Secondary | ICD-10-CM | POA: Diagnosis not present

## 2015-08-11 DIAGNOSIS — M47816 Spondylosis without myelopathy or radiculopathy, lumbar region: Secondary | ICD-10-CM

## 2015-08-11 DIAGNOSIS — M19012 Primary osteoarthritis, left shoulder: Secondary | ICD-10-CM | POA: Diagnosis not present

## 2015-08-11 DIAGNOSIS — M5126 Other intervertebral disc displacement, lumbar region: Secondary | ICD-10-CM | POA: Diagnosis not present

## 2015-08-11 DIAGNOSIS — M7582 Other shoulder lesions, left shoulder: Secondary | ICD-10-CM | POA: Insufficient documentation

## 2015-08-11 DIAGNOSIS — M5124 Other intervertebral disc displacement, thoracic region: Secondary | ICD-10-CM | POA: Insufficient documentation

## 2015-08-11 DIAGNOSIS — G2581 Restless legs syndrome: Secondary | ICD-10-CM

## 2015-08-11 DIAGNOSIS — M5134 Other intervertebral disc degeneration, thoracic region: Secondary | ICD-10-CM

## 2015-08-11 DIAGNOSIS — M5136 Other intervertebral disc degeneration, lumbar region: Secondary | ICD-10-CM

## 2015-08-11 DIAGNOSIS — Z981 Arthrodesis status: Secondary | ICD-10-CM

## 2015-08-11 DIAGNOSIS — M51369 Other intervertebral disc degeneration, lumbar region without mention of lumbar back pain or lower extremity pain: Secondary | ICD-10-CM

## 2015-08-11 DIAGNOSIS — M25512 Pain in left shoulder: Secondary | ICD-10-CM | POA: Diagnosis present

## 2015-08-11 DIAGNOSIS — M5481 Occipital neuralgia: Secondary | ICD-10-CM

## 2015-08-11 DIAGNOSIS — M170 Bilateral primary osteoarthritis of knee: Secondary | ICD-10-CM

## 2015-08-11 DIAGNOSIS — M533 Sacrococcygeal disorders, not elsewhere classified: Secondary | ICD-10-CM

## 2015-08-11 DIAGNOSIS — M5030 Other cervical disc degeneration, unspecified cervical region: Secondary | ICD-10-CM

## 2015-08-11 DIAGNOSIS — M542 Cervicalgia: Secondary | ICD-10-CM | POA: Diagnosis present

## 2015-08-11 MED ORDER — OXYCODONE HCL 10 MG PO TABS: ORAL_TABLET | ORAL | Status: DC

## 2015-08-11 MED ORDER — CHLORZOXAZONE 500 MG PO TABS: ORAL_TABLET | ORAL | Status: DC

## 2015-08-11 NOTE — Patient Instructions (Addendum)
PLAN   Continue present medication Parafon forte  and oxycodone No Tramadol   F/U PCP  Humphrey Rolls for evaliation of  BP and general medical condition  Follow-up with C Matthew Saras as planned  F/U GYN as discussed  F/U surgical evaluation as discussed Patient will follow-up with Dr.Krasinski   F/U [facility] as discussed and visit she store as discussed  F/U neurological evaluation. May consider pending follow-up evaluatio  May consider radiofrequency rhizolysis or intraspinal procedures pending response to present treatment and F/U evaluation  Patient to call [facility] should patient have concerns prior to scheduled return appointment.  Smoking Cessation, Tips for Success If you are ready to quit smoking, congratulations! You have chosen to help yourself be healthier. Cigarettes bring nicotine, tar, carbon monoxide, and other irritants into your body. Your lungs, heart, and blood vessels will be able to work better without these poisons. There are many different ways to quit smoking. Nicotine gum, nicotine patches, a nicotine inhaler, or nicotine nasal spray can help with physical craving. Hypnosis, support groups, and medicines help break the habit of smoking. WHAT THINGS CAN I DO TO MAKE QUITTING EASIER?  Here are some tips to help you quit for good:  Pick a date when you will quit smoking completely. Tell all of your friends and family about your plan to quit on that date.  Do not try to slowly cut down on the number of cigarettes you are smoking. Pick a quit date and quit smoking completely starting on that day.  Throw away all cigarettes.   Clean and remove all ashtrays from your home, work, and car.  On a card, write down your reasons for quitting. Carry the card with you and read it when you get the urge to smoke.  Cleanse your body of nicotine. Drink enough water and fluids to keep your urine clear or pale yellow. Do this after quitting to flush the nicotine  from your body.  Learn to predict your moods. Do not let a bad situation be your excuse to have a cigarette. Some situations in your life might tempt you into wanting a cigarette.  Never have "just one" cigarette. It leads to wanting another and another. Remind yourself of your decision to quit.  Change habits associated with smoking. If you smoked while driving or when feeling stressed, try other activities to replace smoking. Stand up when drinking your coffee. Brush your teeth after eating. Sit in a different chair when you read the paper. Avoid alcohol while trying to quit, and try to drink fewer caffeinated beverages. Alcohol and caffeine may urge you to smoke.  Avoid foods and drinks that can trigger a desire to smoke, such as sugary or spicy foods and alcohol.  Ask people who smoke not to smoke around you.  Have something planned to do right after eating or having a cup of coffee. For example, plan to take a walk or exercise.  Try a relaxation exercise to calm you down and decrease your stress. Remember, you may be tense and nervous for the first 2 weeks after you quit, but this will pass.  Find new activities to keep your hands busy. Play with a pen, coin, or rubber band. Doodle or draw things on paper.  Brush your teeth right after eating. This will help cut down on the craving for the taste of tobacco after meals. You can also try mouthwash.   Use oral substitutes in place of cigarettes. Try using lemon drops,  carrots, cinnamon sticks, or chewing gum. Keep them handy so they are available when you have the urge to smoke.  When you have the urge to smoke, try deep breathing.  Designate your home as a nonsmoking area.  If you are a heavy smoker, ask your health care provider about a prescription for nicotine chewing gum. It can ease your withdrawal from nicotine.  Reward yourself. Set aside the cigarette money you save and buy yourself something nice.  Look for support from  others. Join a support group or smoking cessation program. Ask someone at home or at work to help you with your plan to quit smoking.  Always ask yourself, "Do I need this cigarette or is this just a reflex?" Tell yourself, "Today, I choose not to smoke," or "I do not want to smoke." You are reminding yourself of your decision to quit.  Do not replace cigarette smoking with electronic cigarettes (commonly called e-cigarettes). The safety of e-cigarettes is unknown, and some may contain harmful chemicals.  If you relapse, do not give up! Plan ahead and think about what you will do the next time you get the urge to smoke. HOW WILL I FEEL WHEN I QUIT SMOKING? You may have symptoms of withdrawal because your body is used to nicotine (the addictive substance in cigarettes). You may crave cigarettes, be irritable, feel very hungry, cough often, get headaches, or have difficulty concentrating. The withdrawal symptoms are only temporary. They are strongest when you first quit but will go away within 10-14 days. When withdrawal symptoms occur, stay in control. Think about your reasons for quitting. Remind yourself that these are signs that your body is healing and getting used to being without cigarettes. Remember that withdrawal symptoms are easier to treat than the major diseases that smoking can cause.  Even after the withdrawal is over, expect periodic urges to smoke. However, these cravings are generally short lived and will go away whether you smoke or not. Do not smoke! WHAT RESOURCES ARE AVAILABLE TO HELP ME QUIT SMOKING? Your health care provider can direct you to community resources or hospitals for support, which may include:  Group support.  Education.  Hypnosis.  Therapy.   This information is not intended to replace advice given to you by your health care provider. Make sure you discuss any questions you have with your health care provider.   Document Released: 10/16/2003 Document  Revised: 02/07/2014 Document Reviewed: 07/05/2012 Elsevier Interactive Patient Education [employer].

## 2015-08-11 NOTE — Progress Notes (Signed)
   Subjective:    Patient ID: Briana [name], female    DOB: Jul 26, 1965, 50 y.o.   MRN: [id number]  HPI  The patient is a 50 year old female who returns to pain management for further evaluation and treatment of pain involving the neck upper extremity regions especially the left shoulder as well as the lower back and lower extremity regions. The patient stated that she had an episode of feeling pain and with racing of her heart when she was at a wedding recently. The patient is undergoing further evaluation with her primary care physician C Holland .Marland Kitchen The patient is undergoing evaluation of her cardiac condition thyroid condition and other conditions. At the present time we will continue medications as prescribed. We will remain available to consider modifications of treatment as discussed with patient.. We will continue patient's oxycodone and Parafon forte as prescribed at this time All agreed to suggested treatment plan.  Review of Systems     Objective:   Physical Exam  There was tenderness to palpation of the paraspinal musculature region of the cervical region cervical facet region palpation which reproduces mild discomfort with mild tenderness of the splenius capitis and occipitalis regions. Palpation over the region of the thoracic region was with tenderness to palpation without crepitus of the thoracic region. The patient was with limited range of motion of the shoulders especially the left shoulder and was unable to perform drop test without significant difficulty. The patient appeared to be with slightly decreased grip strength and Tinel and Phalen's maneuver were without increased pain of significant degree. Palpation over the lumbar region was a tennis to palpation of moderate degree with moderate tenderness over the lumbar facets on the left as well as on the right as well as moderate tenderness of the PSIS and PII S regions. Straight leg raise was tolerates approximately 30 without an  increase of pain of dorsiflexion noted. EHL strength appeared to be decreased. There was negative clonus negative Homans. Abdomen nontender with no costovertebral tenderness noted      Assessment & Plan:      Degenerative disc disease lumbar spine  Degenerative disc disease lumbar spine T12-L1, L1-2, L2-3,, L3-4, L4-5, L5-S1 with paracentral disc bulging partial effacement of the anterior cerebrospinal fluid space  Degenerative joint disease of shouldersStatus post surgery of the right shoulder  MRI of left shoulder with severe tendinosis of the supraspinatus tendon with a small partial thickness bursal surface tear of the posterior fibers.  Thoracic facet syndrome  Sacroiliac joint dysfunction  Degenerative joint disease of shoulders     PLAN   Continue present medication Parafon forte  and oxycodone No Tramadol   F/U PCP  Humphrey Rolls for evaliation of  BP and general medical condition  Follow-up with C Holland as planned for further evaluation of cardiac condition thyroid condition and general medical condition as planned  F/U GYN as discussed  F/U surgical evaluation as discussed Patient will follow-up with Dr.Krasinski   F/U [facility] as discussed and visit she store as discussed  F/U neurological evaluation. May consider pending follow-up evaluatio  May consider radiofrequency rhizolysis or intraspinal procedures pending response to present treatment and F/U evaluation  Patient to call [facility] should patient have concerns prior to scheduled return appointment.

## 2015-08-11 NOTE — Progress Notes (Signed)
Safety precautions to be maintained throughout the outpatient stay will include: orient to surroundings, keep bed in low position, maintain call bell within reach at all times, provide assistance with transfer out of bed and ambulation.  

## 2015-09-14 ENCOUNTER — Encounter: Payer: Self-pay | Admitting: Pain Medicine

## 2015-09-14 ENCOUNTER — Ambulatory Visit: Payer: Medicare Other | Attending: Pain Medicine | Admitting: Pain Medicine

## 2015-09-14 VITALS — BP 99/72 | HR 80 | Temp 98.1°F | Resp 16 | Ht 61.0 in | Wt 154.0 lb

## 2015-09-14 DIAGNOSIS — M7592 Shoulder lesion, unspecified, left shoulder: Secondary | ICD-10-CM | POA: Insufficient documentation

## 2015-09-14 DIAGNOSIS — M5136 Other intervertebral disc degeneration, lumbar region: Secondary | ICD-10-CM | POA: Diagnosis not present

## 2015-09-14 DIAGNOSIS — Z981 Arthrodesis status: Secondary | ICD-10-CM

## 2015-09-14 DIAGNOSIS — M533 Sacrococcygeal disorders, not elsewhere classified: Secondary | ICD-10-CM | POA: Diagnosis not present

## 2015-09-14 DIAGNOSIS — M47816 Spondylosis without myelopathy or radiculopathy, lumbar region: Secondary | ICD-10-CM

## 2015-09-14 DIAGNOSIS — M5126 Other intervertebral disc displacement, lumbar region: Secondary | ICD-10-CM | POA: Insufficient documentation

## 2015-09-14 DIAGNOSIS — M19012 Primary osteoarthritis, left shoulder: Secondary | ICD-10-CM | POA: Diagnosis not present

## 2015-09-14 DIAGNOSIS — M19011 Primary osteoarthritis, right shoulder: Secondary | ICD-10-CM | POA: Diagnosis not present

## 2015-09-14 DIAGNOSIS — M542 Cervicalgia: Secondary | ICD-10-CM | POA: Diagnosis present

## 2015-09-14 DIAGNOSIS — M5481 Occipital neuralgia: Secondary | ICD-10-CM

## 2015-09-14 DIAGNOSIS — M546 Pain in thoracic spine: Secondary | ICD-10-CM | POA: Diagnosis present

## 2015-09-14 DIAGNOSIS — M51369 Other intervertebral disc degeneration, lumbar region without mention of lumbar back pain or lower extremity pain: Secondary | ICD-10-CM

## 2015-09-14 DIAGNOSIS — M5134 Other intervertebral disc degeneration, thoracic region: Secondary | ICD-10-CM

## 2015-09-14 DIAGNOSIS — M5030 Other cervical disc degeneration, unspecified cervical region: Secondary | ICD-10-CM

## 2015-09-14 MED ORDER — OXYCODONE HCL 10 MG PO TABS: ORAL_TABLET | ORAL | 0 refills | Status: DC

## 2015-09-14 MED ORDER — CHLORZOXAZONE 500 MG PO TABS: ORAL_TABLET | ORAL | 0 refills | Status: DC
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
## 2015-09-14 NOTE — Progress Notes (Signed)
     The patient is a 50 year old female who returns to pain management for further evaluation and treatment of pain involving the neck entire back upper extremity regions. The patient is with significant pain involving the shoulders and will undergo follow-up surgical evaluation with Dr. Mack Guise as planned with plans consider surgical intervention of the shoulder. The patient also has had some pain involving the left wrist. Patient denies any definite trauma to the risks. The patient will follow-up with Dr. Mack Guise regarding pain of the left wrist as well when patient has appointment tomorrow 09/15/2015. At the present time we will continue patient's present medications and we will remain available to consider modification of treatment pending follow-up evaluation and further assessment of patient's condition. The patient was with understanding and agreement suggested treatment plan.    Physical examination  Was tenderness of the splenius capitis and occipitalis region a moderate degree with moderate to severe tenderness of the acromioclavicular and glenohumeral joint regions with significant improvement of range of motion of the shoulder. The patient was unable to perform drop test. Tinel and Phalen's maneuver associated with severe discomfort attempt to be performed of the left wrist. There was no definite increased warmth erythema noted in the region of the left wrist. There was slight swelling of the wrist without discoloration. Grip strength was decreased on the left compared to the right. Palpation over the region of the thoracic region was attends to palpation of moderate degree with moderate muscle spasms without crepitus. Palpation over the lumbar region was with moderate discomfort with lateral bending rotation extension and palpation of the lumbar facets reproducing moderate discomfort. Straight leg raise was tolerates 30 without increased pain with dorsiflexion noted. Knees were attends to  palpation with crepitus of the knees with negative anterior and posterior drawer signs without ballottement of the patella. No definite sensory deficit or dermatomal dystrophy detected. Negative clonus negative Homans. Abdomen nontender with no costovertebral tenderness noted     Assessment   Degenerative disc disease lumbar spine  Degenerative disc disease lumbar spine T12-L1, L1-2, L2-3,, L3-4, L4-5, L5-S1 with paracentral disc bulging partial effacement of the anterior cerebrospinal fluid space  Degenerative joint disease of shouldersStatus post surgery of the right shoulder  MRI of left shoulder with severe tendinosis of the supraspinatus tendon with a small partial thickness bursal surface tear of the posterior fibers.  Thoracic facet syndrome  Sacroiliac joint dysfunction  Degenerative joint disease of shoulders  Sprain/strain of left wrist versus arthritis of left wrist     PLAN   Continue present medication Parafon forte  and oxycodone No Tramadol   F/U PCP  Humphrey Rolls for evaliation of  BP and general medical condition  Follow-up with C Matthew Saras as planned  F/U GYN as discussed  F/U surgical evaluation as discussed Patient will follow-up with Dr.Krasinski tomorrow  F/U [facility] as discussed and visit she store as discussed  F/U neurological evaluation. May consider pending follow-up evaluatio  May consider radiofrequency rhizolysis or intraspinal procedures pending response to present treatment and F/U evaluation  Patient to call [facility] should patient have concerns prior to scheduled return appointment.

## 2015-09-14 NOTE — Patient Instructions (Addendum)
PLAN   Continue present medication Parafon forte  and oxycodone No Tramadol   F/U PCP  Humphrey Rolls for evaliation of  BP and general medical condition  Follow-up with C Matthew Saras as planned  F/U GYN as discussed  F/U surgical evaluation as discussed Patient will follow-up with Dr.Krasinski tomorrow  F/U [facility] as discussed and visit she store as discussed  F/U neurological evaluation. May consider pending follow-up evaluatio  May consider radiofrequency rhizolysis or intraspinal procedures pending response to present treatment and F/U evaluation  Patient to call [facility] should patient have concerns prior to scheduled return appointment.

## 2015-09-15 ENCOUNTER — Encounter: Payer: Medicare Other | Admitting: Pain Medicine

## 2015-09-16 ENCOUNTER — Other Ambulatory Visit: Payer: Self-pay | Admitting: Orthopedic Surgery

## 2015-09-30 ENCOUNTER — Telehealth: Payer: Self-pay | Admitting: *Deleted

## 2015-10-07 ENCOUNTER — Encounter
Admission: RE | Admit: 2015-10-07 | Discharge: 2015-10-07 | Disposition: A | Discharge status: Home / Self Care | Payer: Medicare Other | Source: Ambulatory Visit | Attending: Orthopedic Surgery | Admitting: Orthopedic Surgery

## 2015-10-07 DIAGNOSIS — Z01812 Encounter for preprocedural laboratory examination: Secondary | ICD-10-CM | POA: Insufficient documentation

## 2015-10-07 HISTORY — DX: Other intervertebral disc degeneration, lumbar region without mention of lumbar back pain or lower extremity pain: M51.369

## 2015-10-07 HISTORY — DX: Restless legs syndrome: G25.81

## 2015-10-07 HISTORY — DX: Personal history of other diseases of the digestive system: Z87.19

## 2015-10-07 HISTORY — DX: Other intervertebral disc degeneration, lumbar region: M51.36

## 2015-10-07 HISTORY — DX: Sleep apnea, unspecified: G47.30

## 2015-10-07 LAB — CBC WITH DIFFERENTIAL/PLATELET
BASOS ABS: 0.1 10*3/uL (ref 0–0.1)
Basophils Relative: 1 %
Eosinophils Absolute: 0.2 10*3/uL (ref 0–0.7)
Eosinophils Relative: 2 %
HEMATOCRIT: 42.4 % (ref 35.0–47.0)
HEMOGLOBIN: 14.5 g/dL (ref 12.0–16.0)
LYMPHS ABS: 1.9 10*3/uL (ref 1.0–3.6)
LYMPHS PCT: 16 %
MCH: 33.5 pg (ref 26.0–34.0)
MCHC: 34.2 g/dL (ref 32.0–36.0)
MCV: 98 fL (ref 80.0–100.0)
Monocytes Absolute: 0.5 10*3/uL (ref 0.2–0.9)
Monocytes Relative: 4 %
NEUTROS ABS: 9.3 10*3/uL — AB (ref 1.4–6.5)
NEUTROS PCT: 77 %
Platelets: 239 10*3/uL (ref 150–440)
RBC: 4.33 MIL/uL (ref 3.80–5.20)
RDW: 14.1 % (ref 11.5–14.5)
WBC: 12 10*3/uL — AB (ref 3.6–11.0)

## 2015-10-07 LAB — PROTIME-INR
INR: 0.96
Prothrombin Time: 12.8 seconds (ref 11.4–15.2)

## 2015-10-07 LAB — BASIC METABOLIC PANEL
ANION GAP: 7 (ref 5–15)
BUN: 15 mg/dL (ref 6–20)
CALCIUM: 9.2 mg/dL (ref 8.9–10.3)
CO2: 22 mmol/L (ref 22–32)
Chloride: 111 mmol/L (ref 101–111)
Creatinine, Ser: 1.04 mg/dL — ABNORMAL HIGH (ref 0.44–1.00)
GLUCOSE: 109 mg/dL — AB (ref 65–99)
POTASSIUM: 3.7 mmol/L (ref 3.5–5.1)
Sodium: 140 mmol/L (ref 135–145)

## 2015-10-07 LAB — APTT: aPTT: 28 seconds (ref 24–36)

## 2015-10-07 NOTE — Patient Instructions (Addendum)
  Your procedure is scheduled FO:[id number] 13, 2017 (Wednesday) Report to Same Day Surgery 2nd floor [facility] To find out your arrival time please call [phone] between 1PM - 3PM on October 13, 2015 (Tuesday)  Remember: Instructions that are not followed completely may result in serious medical risk, up to and including death, or upon the discretion of your surgeon and anesthesiologist your surgery may need to be rescheduled.    _x___ 1. Do not eat food or drink liquids after midnight. No gum chewing or hard candies.     _x___ 2. No Alcohol for 24 hours before or after surgery.   _x__3. No Smoking for 24 prior to surgery.   ____  4. Bring all medications with you on the day of surgery if instructed.    __x__ 5. Notify your doctor if there is any change in your medical condition     (cold, fever, infections).     Do not wear jewelry, make-up, hairpins, clips or nail polish.  Do not wear lotions, powders, or perfumes. You may wear deodorant.  Do not shave 48 hours prior to surgery. Men may shave face and neck.  Do not bring valuables to the hospital.    [facility] is not responsible for any belongings or valuables.               Contacts, dentures or bridgework may not be worn into surgery.  Leave your suitcase in the car. After surgery it may be brought to your room.  For patients admitted to the hospital, discharge time is determined by your treatment team.   Patients discharged the day of surgery will not be allowed to drive home.    Please read over the following fact sheets that you were given:   [facility] Preparing for Surgery and or MRSA Information   _x___ Take these medicines the morning of surgery with A SIP OF WATER:    1. Prilosec (Prilosec at bedtime Tuesday night)  2. Magnesium Oxide  3. Topamax  4. Xanax if needed  5.  6.  ____ Fleet Enema (as directed)   _x___ Use CHG Soap or sage wipes as directed on instruction sheet   _x___ Use inhalers on  the day of surgery and bring to hospital day of surgery (USE ALBUTEROL NEBULIZER ,  AND SPIRIVA INHALER THE MORNING OF SURGERY, AND [place])  ____ Stop metformin 2 days prior to surgery    ____ Take 1/2 of usual insulin dose the night before surgery and none on the morning of surgery.            _x___ Stop aspirin or coumadin, or plavix (NO ASPIRIN)  _x__ Stop Anti-inflammatories such as Advil, Aleve, Ibuprofen, Motrin, Naproxen,          Naprosyn, Goodies powders or aspirin products. Ok to take Tylenol.   _x___ Stop supplements until after surgery.  (STOP VITAMIN E)  ____ Bring C-Pap to the hospital.

## 2015-10-12 ENCOUNTER — Ambulatory Visit: Payer: Medicare Other | Attending: Pain Medicine | Admitting: Pain Medicine

## 2015-10-12 VITALS — BP 113/77 | HR 94 | Temp 98.3°F | Resp 16 | Ht 61.0 in | Wt 154.0 lb

## 2015-10-12 DIAGNOSIS — M19012 Primary osteoarthritis, left shoulder: Secondary | ICD-10-CM | POA: Diagnosis not present

## 2015-10-12 DIAGNOSIS — M5030 Other cervical disc degeneration, unspecified cervical region: Secondary | ICD-10-CM

## 2015-10-12 DIAGNOSIS — M25512 Pain in left shoulder: Secondary | ICD-10-CM | POA: Diagnosis present

## 2015-10-12 DIAGNOSIS — M5134 Other intervertebral disc degeneration, thoracic region: Secondary | ICD-10-CM

## 2015-10-12 DIAGNOSIS — M5136 Other intervertebral disc degeneration, lumbar region: Secondary | ICD-10-CM | POA: Diagnosis not present

## 2015-10-12 DIAGNOSIS — M533 Sacrococcygeal disorders, not elsewhere classified: Secondary | ICD-10-CM | POA: Diagnosis not present

## 2015-10-12 DIAGNOSIS — M19011 Primary osteoarthritis, right shoulder: Secondary | ICD-10-CM | POA: Diagnosis not present

## 2015-10-12 DIAGNOSIS — M25511 Pain in right shoulder: Secondary | ICD-10-CM | POA: Diagnosis present

## 2015-10-12 DIAGNOSIS — M7582 Other shoulder lesions, left shoulder: Secondary | ICD-10-CM | POA: Diagnosis not present

## 2015-10-12 DIAGNOSIS — Z981 Arthrodesis status: Secondary | ICD-10-CM

## 2015-10-12 DIAGNOSIS — M47816 Spondylosis without myelopathy or radiculopathy, lumbar region: Secondary | ICD-10-CM

## 2015-10-12 DIAGNOSIS — Z9889 Other specified postprocedural states: Secondary | ICD-10-CM | POA: Diagnosis not present

## 2015-10-12 DIAGNOSIS — M62830 Muscle spasm of back: Secondary | ICD-10-CM | POA: Diagnosis not present

## 2015-10-12 DIAGNOSIS — M542 Cervicalgia: Secondary | ICD-10-CM | POA: Diagnosis present

## 2015-10-12 MED ORDER — CHLORZOXAZONE 500 MG PO TABS: ORAL_TABLET | ORAL | 0 refills | Status: DC

## 2015-10-12 MED ORDER — OXYCODONE HCL 10 MG PO TABS: ORAL_TABLET | ORAL | 0 refills | Status: DC

## 2015-10-12 NOTE — Patient Instructions (Addendum)
PLAN   Continue present medication Parafon forte  and oxycodone No Tramadol   F/U PCP  Humphrey Rolls for evaliation of  BP and general medical condition  Follow-up with C Matthew Saras as planned  F/U GYN as discussed  F/U surgical evaluation as discussed Patient will follow-up with Dr.Krasinski tomorrow  F/U [facility] as discussed and visit she store as discussed  F/U neurological evaluation. May consider pending follow-up evaluation  May consider radiofrequency rhizolysis or intraspinal procedures pending response to present treatment and F/U evaluation  Patient to call [facility] Center should patient have concerns prior to scheduled return appointment.

## 2015-10-13 ENCOUNTER — Ambulatory Visit: Payer: Medicare Other | Admitting: Pain Medicine

## 2015-10-13 ENCOUNTER — Encounter: Payer: Self-pay | Admitting: Pain Medicine

## 2015-10-13 NOTE — Progress Notes (Signed)
The patient is a 50 year old female who returns to pain management for further evaluation and treatment of pain involving the neck shoulders and mid back and lower back region. The patient is with plans to undergo surgical intervention of the left shoulder at this time and will follow-up with Dr. Mack Guise in this regard. The patient is with pain of the of the shoulder aggravated with region lifting pushing pulling maneuvers and interferes patient ability to obtain restful sleep. The patient will proceed with surgical intervention of the shoulder and we will continue present medication regimen and we will consider modifications of treatment regimen pending further assessment of patient's condition with Dr. Mack Guise following surgical intervention of the shoulder. All agreed to suggested treatment plan    Physical examination  There was tenderness to palpation of the paraspinal musculature region of the cervical region cervical facet region with moderate tenderness over the region of the splenius capitis and occipitalis regions. Palpation over the region of the cervical and thoracic facet regions reproduce moderate discomfort. There was moderately severe tenderness of the acromioclavicular and glenohumeral joint regions. The patient appeared to be with decreased grip strength on the left compared to the right. There was no significant increase of pain with Tinel and Phalen's maneuver. There was unremarkable Spurling's maneuver and patient was unable to perform drop test due to pain of the left shoulder. There was tenderness over the lumbar facet lumbar paraspinal musculature region with palpation of the PSIS and PII S region reproducing moderate discomfort with straight leg raising tolerates approximately 20 without increased pain with dorsiflexion noted. EHL strength appeared to be slightly decreased without sensory deficit or dermatomal distribution detected. There was negative clonus negative  Homans. Abdomen nontender with no costovertebral tenderness noted.      Was tenderness of the splenius capitis and occipitalis region a moderate degree with moderate to severe tenderness of the acromioclavicular and glenohumeral joint regions with significant improvement of range of motion of the shoulder. The patient was unable to perform drop test. Tinel and Phalen's maneuver associated with severe discomfort attempt to be performed of the left wrist. There was no definite increased warmth erythema noted in the region of the left wrist. There was slight swelling of the wrist without discoloration. Grip strength was decreased on the left compared to the right. Palpation over the region of the thoracic region was attends to palpation of moderate degree with moderate muscle spasms without crepitus. Palpation over the lumbar region was with moderate discomfort with lateral bending rotation extension and palpation of the lumbar facets reproducing moderate discomfort. Straight leg raise was tolerates 30 without increased pain with dorsiflexion noted. Knees were attends to palpation with crepitus of the knees with negative anterior and posterior drawer signs without ballottement of the patella. No definite sensory deficit or dermatomal dystrophy detected. Negative clonus negative Homans. Abdomen nontender with no costovertebral tenderness noted     Assessment   Degenerative disc disease lumbar spine  Degenerative disc disease lumbar spine T12-L1, L1-2, L2-3,, L3-4, L4-5, L5-S1 with paracentral disc bulging partial effacement of the anterior cerebrospinal fluid space  Degenerative joint disease of shouldersStatus post surgery of the right shoulder  MRI of left shoulder with severe tendinosis of the supraspinatus tendon with a small partial thickness bursal surface tear of the posterior fibers.  Thoracic facet syndrome  Sacroiliac joint dysfunction  Degenerative joint disease of  shoulders  Sprain/strain of left wrist versus arthritis of left wrist     PLAN  Continue present medication Parafon forte  and oxycodone No Tramadol   F/U PCP  Humphrey Rolls for evaliation of  BP and general medical condition  Follow-up with C Matthew Saras as planned  F/U GYN as discussed  F/U surgical evaluation as discussed Patient will follow-up with Dr.Krasinski tomorrow  F/U [facility] pod[facility]rist as discussed and visit she store as discussed  F/U neurological evaluation. May consider pending follow-up evaluation  May consider radiofrequency rhizolysis or intraspinal procedures pending response to present treatment and F/U evaluation  Patient to call [facility] should patient have concerns prior to scheduled return appointm

## 2015-10-14 ENCOUNTER — Ambulatory Visit: Payer: Medicare Other | Admitting: Certified Registered Nurse Anesthetist

## 2015-10-14 ENCOUNTER — Encounter: Payer: Self-pay | Admitting: Cardiology

## 2015-10-14 ENCOUNTER — Encounter
Admission: AD | Disposition: A | Discharge status: Home / Self Care | Payer: Self-pay | Source: Ambulatory Visit | Attending: Orthopedic Surgery

## 2015-10-14 ENCOUNTER — Observation Stay
Admission: AD | Admit: 2015-10-14 | Discharge: 2015-10-15 | Disposition: A | Discharge status: Home / Self Care | Payer: Medicare Other | Source: Ambulatory Visit | Attending: Orthopedic Surgery | Admitting: Orthopedic Surgery

## 2015-10-14 DIAGNOSIS — M797 Fibromyalgia: Secondary | ICD-10-CM | POA: Diagnosis not present

## 2015-10-14 DIAGNOSIS — M7502 Adhesive capsulitis of left shoulder: Principal | ICD-10-CM | POA: Insufficient documentation

## 2015-10-14 DIAGNOSIS — G4730 Sleep apnea, unspecified: Secondary | ICD-10-CM | POA: Insufficient documentation

## 2015-10-14 DIAGNOSIS — Z91018 Allergy to other foods: Secondary | ICD-10-CM | POA: Diagnosis not present

## 2015-10-14 DIAGNOSIS — E7800 Pure hypercholesterolemia, unspecified: Secondary | ICD-10-CM | POA: Insufficient documentation

## 2015-10-14 DIAGNOSIS — I10 Essential (primary) hypertension: Secondary | ICD-10-CM | POA: Diagnosis not present

## 2015-10-14 DIAGNOSIS — Z809 Family history of malignant neoplasm, unspecified: Secondary | ICD-10-CM | POA: Insufficient documentation

## 2015-10-14 DIAGNOSIS — Z91041 Radiographic dye allergy status: Secondary | ICD-10-CM | POA: Insufficient documentation

## 2015-10-14 DIAGNOSIS — F17200 Nicotine dependence, unspecified, uncomplicated: Secondary | ICD-10-CM | POA: Insufficient documentation

## 2015-10-14 DIAGNOSIS — Z8249 Family history of ischemic heart disease and other diseases of the circulatory system: Secondary | ICD-10-CM | POA: Insufficient documentation

## 2015-10-14 DIAGNOSIS — Z6829 Body mass index (BMI) 29.0-29.9, adult: Secondary | ICD-10-CM | POA: Insufficient documentation

## 2015-10-14 DIAGNOSIS — S43429A Sprain of unspecified rotator cuff capsule, initial encounter: Secondary | ICD-10-CM | POA: Diagnosis present

## 2015-10-14 DIAGNOSIS — Z79899 Other long term (current) drug therapy: Secondary | ICD-10-CM | POA: Insufficient documentation

## 2015-10-14 DIAGNOSIS — Z91030 Bee allergy status: Secondary | ICD-10-CM | POA: Diagnosis not present

## 2015-10-14 DIAGNOSIS — M19012 Primary osteoarthritis, left shoulder: Secondary | ICD-10-CM | POA: Diagnosis not present

## 2015-10-14 DIAGNOSIS — M7542 Impingement syndrome of left shoulder: Secondary | ICD-10-CM | POA: Diagnosis not present

## 2015-10-14 DIAGNOSIS — Z91040 Latex allergy status: Secondary | ICD-10-CM | POA: Diagnosis not present

## 2015-10-14 DIAGNOSIS — Z8261 Family history of arthritis: Secondary | ICD-10-CM | POA: Diagnosis not present

## 2015-10-14 DIAGNOSIS — M7522 Bicipital tendinitis, left shoulder: Secondary | ICD-10-CM | POA: Insufficient documentation

## 2015-10-14 DIAGNOSIS — F40240 Claustrophobia: Secondary | ICD-10-CM | POA: Insufficient documentation

## 2015-10-14 DIAGNOSIS — E669 Obesity, unspecified: Secondary | ICD-10-CM | POA: Diagnosis not present

## 2015-10-14 DIAGNOSIS — M75112 Incomplete rotator cuff tear or rupture of left shoulder, not specified as traumatic: Secondary | ICD-10-CM | POA: Insufficient documentation

## 2015-10-14 DIAGNOSIS — Z818 Family history of other mental and behavioral disorders: Secondary | ICD-10-CM | POA: Insufficient documentation

## 2015-10-14 DIAGNOSIS — Z9889 Other specified postprocedural states: Secondary | ICD-10-CM

## 2015-10-14 DIAGNOSIS — M7500 Adhesive capsulitis of unspecified shoulder: Secondary | ICD-10-CM | POA: Diagnosis present

## 2015-10-14 DIAGNOSIS — G43909 Migraine, unspecified, not intractable, without status migrainosus: Secondary | ICD-10-CM | POA: Insufficient documentation

## 2015-10-14 DIAGNOSIS — Z825 Family history of asthma and other chronic lower respiratory diseases: Secondary | ICD-10-CM | POA: Insufficient documentation

## 2015-10-14 DIAGNOSIS — F319 Bipolar disorder, unspecified: Secondary | ICD-10-CM | POA: Diagnosis not present

## 2015-10-14 HISTORY — DX: Thyrotoxicosis, unspecified without thyrotoxic crisis or storm: E05.90

## 2015-10-14 HISTORY — PX: LYSIS OF ADHESION: SHX5961

## 2015-10-14 HISTORY — PX: SHOULDER ARTHROSCOPY WITH OPEN ROTATOR CUFF REPAIR: SHX6092

## 2015-10-14 HISTORY — PX: RESECTION DISTAL CLAVICAL: SHX5053

## 2015-10-14 HISTORY — PX: SUBACROMIAL DECOMPRESSION: SHX5174

## 2015-10-14 SURGERY — ARTHROSCOPY, SHOULDER WITH REPAIR, ROTATOR CUFF, OPEN
Anesthesia: General | Site: Shoulder | Laterality: Left | Wound class: Clean

## 2015-10-14 MED ORDER — ROSUVASTATIN CALCIUM 20 MG PO TABS
40.0000 mg | ORAL_TABLET | Freq: Every day | ORAL | Status: DC
  Administered 2015-10-14 – 2015-10-15 (×2): 40 mg via ORAL
  Filled 2015-10-14 (×2): qty 2

## 2015-10-14 MED ORDER — SUGAMMADEX SODIUM 200 MG/2ML IV SOLN
INTRAVENOUS | Status: DC | PRN
  Administered 2015-10-14: 140 mg via INTRAVENOUS

## 2015-10-14 MED ORDER — SENNA 8.6 MG PO TABS
1.0000 | ORAL_TABLET | Freq: Two times a day (BID) | ORAL | Status: DC
  Administered 2015-10-14 – 2015-10-15 (×2): 8.6 mg via ORAL
  Filled 2015-10-14 (×2): qty 1

## 2015-10-14 MED ORDER — GABAPENTIN 100 MG PO CAPS: 200.0000 mg | ORAL_CAPSULE | Freq: Once | ORAL | Status: DC

## 2015-10-14 MED ORDER — VITAMIN D 1000 UNITS PO TABS
2000.0000 [IU] | ORAL_TABLET | Freq: Every day | ORAL | Status: DC
  Administered 2015-10-14 – 2015-10-15 (×2): 2000 [IU] via ORAL
  Filled 2015-10-14 (×2): qty 2

## 2015-10-14 MED ORDER — CHLORHEXIDINE GLUCONATE CLOTH 2 % EX PADS: 6.0000 | MEDICATED_PAD | Freq: Once | CUTANEOUS | Status: DC

## 2015-10-14 MED ORDER — LIDOCAINE HCL (CARDIAC) 20 MG/ML IV SOLN
INTRAVENOUS | Status: DC | PRN
  Administered 2015-10-14: 60 mg via INTRAVENOUS

## 2015-10-14 MED ORDER — PROPOFOL 10 MG/ML IV BOLUS
INTRAVENOUS | Status: DC | PRN
  Administered 2015-10-14: 130 mg via INTRAVENOUS

## 2015-10-14 MED ORDER — KETAMINE HCL 50 MG/ML IJ SOLN
INTRAMUSCULAR | Status: DC | PRN
  Administered 2015-10-14: 35 mg via INTRAVENOUS

## 2015-10-14 MED ORDER — OXYCODONE HCL 5 MG PO TABS
10.0000 mg | ORAL_TABLET | ORAL | Status: DC | PRN
  Administered 2015-10-14: 15 mg via ORAL
  Administered 2015-10-15: 10 mg via ORAL
  Administered 2015-10-15 (×2): 15 mg via ORAL
  Filled 2015-10-14: qty 3
  Filled 2015-10-14: qty 2
  Filled 2015-10-14 (×2): qty 3

## 2015-10-14 MED ORDER — ALUM & MAG HYDROXIDE-SIMETH 200-200-20 MG/5ML PO SUSP: 30.0000 mL | ORAL | Status: DC | PRN

## 2015-10-14 MED ORDER — PROMETHAZINE HCL 25 MG/ML IJ SOLN: 6.2500 mg | INTRAMUSCULAR | Status: DC | PRN

## 2015-10-14 MED ORDER — MIDAZOLAM HCL 2 MG/2ML IJ SOLN
INTRAMUSCULAR | Status: DC | PRN
  Administered 2015-10-14: 2 mg via INTRAVENOUS

## 2015-10-14 MED ORDER — OXYCODONE HCL 5 MG PO TABS: 5.0000 mg | ORAL_TABLET | Freq: Once | ORAL | Status: DC | PRN

## 2015-10-14 MED ORDER — LIDOCAINE HCL 1 % IJ SOLN
INTRAMUSCULAR | Status: DC | PRN
  Administered 2015-10-14: 12 mL

## 2015-10-14 MED ORDER — LINACLOTIDE 290 MCG PO CAPS
290.0000 ug | ORAL_CAPSULE | Freq: Every day | ORAL | Status: DC | PRN
  Filled 2015-10-14: qty 1

## 2015-10-14 MED ORDER — DEXAMETHASONE SODIUM PHOSPHATE 4 MG/ML IJ SOLN
INTRAMUSCULAR | Status: DC | PRN
  Administered 2015-10-14: 5 mg via INTRAVENOUS

## 2015-10-14 MED ORDER — ACETAMINOPHEN 325 MG PO TABS: 650.0000 mg | ORAL_TABLET | Freq: Four times a day (QID) | ORAL | Status: DC | PRN

## 2015-10-14 MED ORDER — CELECOXIB 200 MG PO CAPS
200.0000 mg | ORAL_CAPSULE | Freq: Once | ORAL | Status: AC
  Administered 2015-10-14: 200 mg via ORAL

## 2015-10-14 MED ORDER — NEOMYCIN-POLYMYXIN B GU 40-200000 IR SOLN
Status: DC | PRN
  Administered 2015-10-14: 2 mL

## 2015-10-14 MED ORDER — PROMETHAZINE HCL 25 MG RE SUPP: 25.0000 mg | Freq: Four times a day (QID) | RECTAL | Status: DC | PRN

## 2015-10-14 MED ORDER — OXYCODONE HCL 5 MG/5ML PO SOLN: 5.0000 mg | Freq: Once | ORAL | Status: DC | PRN

## 2015-10-14 MED ORDER — TIOTROPIUM BROMIDE MONOHYDRATE 18 MCG IN CAPS: 18.0000 ug | ORAL_CAPSULE | Freq: Every day | RESPIRATORY_TRACT | Status: DC

## 2015-10-14 MED ORDER — KETOROLAC TROMETHAMINE 15 MG/ML IJ SOLN: 15.0000 mg | Freq: Four times a day (QID) | INTRAMUSCULAR | Status: DC | PRN

## 2015-10-14 MED ORDER — CELECOXIB 200 MG PO CAPS
ORAL_CAPSULE | ORAL | Status: AC
  Administered 2015-10-14: 200 mg via ORAL
  Filled 2015-10-14: qty 1

## 2015-10-14 MED ORDER — FENTANYL CITRATE (PF) 100 MCG/2ML IJ SOLN
25.0000 ug | INTRAMUSCULAR | Status: DC | PRN
  Administered 2015-10-14 (×2): 50 ug via INTRAVENOUS

## 2015-10-14 MED ORDER — PREGABALIN 75 MG PO CAPS
75.0000 mg | ORAL_CAPSULE | Freq: Once | ORAL | Status: AC
  Administered 2015-10-14: 75 mg via ORAL

## 2015-10-14 MED ORDER — PANTOPRAZOLE SODIUM 40 MG PO TBEC
40.0000 mg | DELAYED_RELEASE_TABLET | Freq: Every day | ORAL | Status: DC
Start: 2015-10-15 — End: 2015-10-15
  Administered 2015-10-15: 40 mg via ORAL
  Filled 2015-10-14: qty 1

## 2015-10-14 MED ORDER — HYDROMORPHONE HCL 1 MG/ML IJ SOLN
0.2500 mg | INTRAMUSCULAR | Status: DC | PRN
  Administered 2015-10-14 (×2): 0.5 mg via INTRAVENOUS

## 2015-10-14 MED ORDER — NEOMYCIN-POLYMYXIN B GU 40-200000 IR SOLN
Status: AC
  Filled 2015-10-14: qty 2

## 2015-10-14 MED ORDER — BISACODYL 10 MG RE SUPP
10.0000 mg | Freq: Every day | RECTAL | Status: DC | PRN
Start: 2015-10-14 — End: 2015-10-15

## 2015-10-14 MED ORDER — ALBUTEROL SULFATE (2.5 MG/3ML) 0.083% IN NEBU: 3.0000 mL | INHALATION_SOLUTION | Freq: Four times a day (QID) | RESPIRATORY_TRACT | Status: DC | PRN

## 2015-10-14 MED ORDER — NALOXEGOL OXALATE 25 MG PO TABS
25.0000 mg | ORAL_TABLET | Freq: Every day | ORAL | Status: DC
  Filled 2015-10-14 (×2): qty 1

## 2015-10-14 MED ORDER — GABAPENTIN 300 MG PO CAPS
ORAL_CAPSULE | ORAL | Status: AC
  Filled 2015-10-14: qty 1

## 2015-10-14 MED ORDER — METHOCARBAMOL 1000 MG/10ML IJ SOLN
500.0000 mg | Freq: Four times a day (QID) | INTRAVENOUS | Status: DC | PRN
  Filled 2015-10-14: qty 5

## 2015-10-14 MED ORDER — METHOCARBAMOL 500 MG PO TABS: 500.0000 mg | ORAL_TABLET | Freq: Four times a day (QID) | ORAL | Status: DC | PRN

## 2015-10-14 MED ORDER — FENTANYL CITRATE (PF) 100 MCG/2ML IJ SOLN
INTRAMUSCULAR | Status: DC | PRN
  Administered 2015-10-14 (×2): 200 ug via INTRAVENOUS

## 2015-10-14 MED ORDER — MAGNESIUM CITRATE PO SOLN
1.0000 | Freq: Once | ORAL | Status: DC | PRN
  Filled 2015-10-14: qty 296

## 2015-10-14 MED ORDER — ONDANSETRON HCL 4 MG PO TABS: 4.0000 mg | ORAL_TABLET | Freq: Four times a day (QID) | ORAL | Status: DC | PRN

## 2015-10-14 MED ORDER — PREGABALIN 75 MG PO CAPS
ORAL_CAPSULE | ORAL | Status: AC
  Administered 2015-10-14: 75 mg via ORAL
  Filled 2015-10-14: qty 2

## 2015-10-14 MED ORDER — HYDROMORPHONE HCL 1 MG/ML IJ SOLN
INTRAMUSCULAR | Status: AC
  Filled 2015-10-14: qty 1

## 2015-10-14 MED ORDER — LIDOCAINE HCL (PF) 4 % IJ SOLN
INTRAMUSCULAR | Status: DC | PRN
  Administered 2015-10-14: 4 mL via RESPIRATORY_TRACT

## 2015-10-14 MED ORDER — CONJ ESTROG-MEDROXYPROGEST ACE 0.3-1.5 MG PO TABS: 1.0000 | ORAL_TABLET | Freq: Every day | ORAL | Status: DC

## 2015-10-14 MED ORDER — BUPIVACAINE HCL (PF) 0.25 % IJ SOLN
INTRAMUSCULAR | Status: AC
  Filled 2015-10-14: qty 30

## 2015-10-14 MED ORDER — EPINEPHRINE HCL 1 MG/ML IJ SOLN
INTRAMUSCULAR | Status: DC | PRN
  Administered 2015-10-14: 12 mL via SUBCUTANEOUS

## 2015-10-14 MED ORDER — ACETAMINOPHEN 650 MG RE SUPP: 650.0000 mg | Freq: Four times a day (QID) | RECTAL | Status: DC | PRN

## 2015-10-14 MED ORDER — LIDOCAINE 5 % EX OINT
1.0000 "application " | TOPICAL_OINTMENT | Freq: Four times a day (QID) | CUTANEOUS | Status: DC | PRN
  Filled 2015-10-14: qty 35.44

## 2015-10-14 MED ORDER — ACETAMINOPHEN 500 MG PO TABS
1000.0000 mg | ORAL_TABLET | Freq: Four times a day (QID) | ORAL | Status: AC
  Administered 2015-10-14 – 2015-10-15 (×4): 1000 mg via ORAL
  Filled 2015-10-14 (×4): qty 2

## 2015-10-14 MED ORDER — ZOLPIDEM TARTRATE 5 MG PO TABS
5.0000 mg | ORAL_TABLET | Freq: Every evening | ORAL | Status: DC | PRN
  Administered 2015-10-14: 5 mg via ORAL
  Filled 2015-10-14: qty 1

## 2015-10-14 MED ORDER — HYDROMORPHONE HCL 1 MG/ML IJ SOLN
1.0000 mg | INTRAMUSCULAR | Status: DC | PRN
  Administered 2015-10-14 – 2015-10-15 (×4): 1 mg via INTRAVENOUS
  Filled 2015-10-14 (×4): qty 1

## 2015-10-14 MED ORDER — CLINDAMYCIN PHOSPHATE 600 MG/50ML IV SOLN
INTRAVENOUS | Status: AC
  Filled 2015-10-14: qty 50

## 2015-10-14 MED ORDER — DEXTROSE 5 % IV SOLN
INTRAVENOUS | Status: DC | PRN
  Administered 2015-10-14: 15 ug/min via INTRAVENOUS

## 2015-10-14 MED ORDER — CLINDAMYCIN PHOSPHATE 600 MG/50ML IV SOLN
600.0000 mg | Freq: Four times a day (QID) | INTRAVENOUS | Status: AC
  Administered 2015-10-14 – 2015-10-15 (×2): 600 mg via INTRAVENOUS
  Filled 2015-10-14 (×3): qty 50

## 2015-10-14 MED ORDER — TOPIRAMATE 100 MG PO TABS
100.0000 mg | ORAL_TABLET | Freq: Two times a day (BID) | ORAL | Status: DC
  Administered 2015-10-14 – 2015-10-15 (×2): 100 mg via ORAL
  Filled 2015-10-14 (×4): qty 1

## 2015-10-14 MED ORDER — ROCURONIUM BROMIDE 100 MG/10ML IV SOLN
INTRAVENOUS | Status: DC | PRN
  Administered 2015-10-14: 20 mg via INTRAVENOUS
  Administered 2015-10-14: 50 mg via INTRAVENOUS
  Administered 2015-10-14: 10 mg via INTRAVENOUS

## 2015-10-14 MED ORDER — LIDOCAINE 5 % EX PTCH
1.0000 | MEDICATED_PATCH | Freq: Two times a day (BID) | CUTANEOUS | Status: DC
  Administered 2015-10-14 – 2015-10-15 (×2): 1 via TRANSDERMAL
  Filled 2015-10-14 (×3): qty 1

## 2015-10-14 MED ORDER — FLUTICASONE PROPIONATE 50 MCG/ACT NA SUSP
1.0000 | Freq: Every day | NASAL | Status: DC
  Administered 2015-10-14 – 2015-10-15 (×2): 1 via NASAL
  Filled 2015-10-14: qty 16

## 2015-10-14 MED ORDER — MAGNESIUM OXIDE 400 (241.3 MG) MG PO TABS
400.0000 mg | ORAL_TABLET | Freq: Every day | ORAL | Status: DC
  Administered 2015-10-15: 400 mg via ORAL
  Filled 2015-10-14: qty 1

## 2015-10-14 MED ORDER — TRAZODONE HCL 100 MG PO TABS: 200.0000 mg | ORAL_TABLET | Freq: Every day | ORAL | Status: DC

## 2015-10-14 MED ORDER — BUPIVACAINE HCL 0.25 % IJ SOLN
INTRAMUSCULAR | Status: DC | PRN
  Administered 2015-10-14: 30 mL

## 2015-10-14 MED ORDER — MEPERIDINE HCL 25 MG/ML IJ SOLN: 6.2500 mg | INTRAMUSCULAR | Status: DC | PRN

## 2015-10-14 MED ORDER — LIDOCAINE HCL (PF) 1 % IJ SOLN
INTRAMUSCULAR | Status: AC
  Filled 2015-10-14: qty 30

## 2015-10-14 MED ORDER — SUVOREXANT 10 MG PO TABS: 10.0000 mg | ORAL_TABLET | ORAL | Status: DC | PRN

## 2015-10-14 MED ORDER — EPINEPHRINE HCL 1 MG/ML IJ SOLN
INTRAMUSCULAR | Status: AC
  Filled 2015-10-14: qty 1

## 2015-10-14 MED ORDER — ACETAMINOPHEN 10 MG/ML IV SOLN
INTRAVENOUS | Status: AC
  Filled 2015-10-14: qty 100

## 2015-10-14 MED ORDER — GABAPENTIN 300 MG PO CAPS: 300.0000 mg | ORAL_CAPSULE | Freq: Once | ORAL | Status: DC

## 2015-10-14 MED ORDER — DIPHENHYDRAMINE HCL 12.5 MG/5ML PO ELIX: 12.5000 mg | ORAL_SOLUTION | ORAL | Status: DC | PRN

## 2015-10-14 MED ORDER — ONDANSETRON HCL 4 MG/2ML IJ SOLN
INTRAMUSCULAR | Status: DC | PRN
  Administered 2015-10-14: 4 mg via INTRAVENOUS

## 2015-10-14 MED ORDER — CLINDAMYCIN PHOSPHATE 600 MG/50ML IV SOLN: 600.0000 mg | Freq: Once | INTRAVENOUS | Status: DC

## 2015-10-14 MED ORDER — LORAZEPAM 0.5 MG PO TABS
0.5000 mg | ORAL_TABLET | Freq: Three times a day (TID) | ORAL | Status: DC
  Administered 2015-10-14 – 2015-10-15 (×2): 0.5 mg via ORAL
  Filled 2015-10-14 (×2): qty 1

## 2015-10-14 MED ORDER — POLYETHYLENE GLYCOL 3350 17 G PO PACK: 17.0000 g | PACK | Freq: Every day | ORAL | Status: DC | PRN

## 2015-10-14 MED ORDER — LACTATED RINGERS IV SOLN
INTRAVENOUS | Status: DC
  Administered 2015-10-14 (×2): via INTRAVENOUS

## 2015-10-14 MED ORDER — ONDANSETRON HCL 4 MG/2ML IJ SOLN: 4.0000 mg | Freq: Four times a day (QID) | INTRAMUSCULAR | Status: DC | PRN

## 2015-10-14 MED ORDER — FENTANYL CITRATE (PF) 100 MCG/2ML IJ SOLN
INTRAMUSCULAR | Status: AC
  Filled 2015-10-14: qty 2

## 2015-10-14 MED ORDER — ACETAMINOPHEN 10 MG/ML IV SOLN
INTRAVENOUS | Status: DC | PRN
  Administered 2015-10-14: 1000 mg via INTRAVENOUS

## 2015-10-14 MED ORDER — VITAMIN E 45 MG (100 UNIT) PO CAPS
200.0000 [IU] | ORAL_CAPSULE | Freq: Every day | ORAL | Status: DC
  Administered 2015-10-14 – 2015-10-15 (×2): 200 [IU] via ORAL
  Filled 2015-10-14 (×2): qty 2

## 2015-10-14 MED ORDER — SODIUM CHLORIDE 0.9 % IV SOLN
INTRAVENOUS | Status: DC
  Administered 2015-10-14: 19:00:00 via INTRAVENOUS

## 2015-10-14 MED ORDER — DOCUSATE SODIUM 100 MG PO CAPS
100.0000 mg | ORAL_CAPSULE | Freq: Two times a day (BID) | ORAL | Status: DC
  Administered 2015-10-14 – 2015-10-15 (×2): 100 mg via ORAL
  Filled 2015-10-14 (×2): qty 1

## 2015-10-14 SURGICAL SUPPLY — 70 items
ADAPTER IRRIG TUBE 2 SPIKE SOL (ADAPTER) ×6 IMPLANT
ADPR TBG 2 SPK PMP STRL ASCP (ADAPTER) ×4
ANCH SUT Q-FX 2.8 (Anchor) ×2 IMPLANT
ANCHOR ALL-SUT Q-FIX 2.8 (Anchor) ×9 IMPLANT
BUR RADIUS 4.0X18.5 (BURR) ×3 IMPLANT
BUR RADIUS 5.5 (BURR) ×3 IMPLANT
CANISTER SUCT LVC 12 LTR MEDI- (MISCELLANEOUS) ×3 IMPLANT
CANNULA 5.75X7 CRYSTAL CLEAR (CANNULA) ×6 IMPLANT
CANNULA PARTIAL THREAD 2X7 (CANNULA) ×2 IMPLANT
CANNULA TWIST IN 8.25X9CM (CANNULA) ×4 IMPLANT
CONNECTOR PERFECT PASSER (CONNECTOR) ×4 IMPLANT
COOLER POLAR GLACIER W/PUMP (MISCELLANEOUS) ×3 IMPLANT
CRADLE LAMINECT ARM (MISCELLANEOUS) ×3 IMPLANT
DEVICE SUCT BLK HOLE OR FLOOR (MISCELLANEOUS) ×4 IMPLANT
DRAPE IMP U-DRAPE 54X76 (DRAPES) ×6 IMPLANT
DRAPE INCISE IOBAN 66X45 STRL (DRAPES) ×3 IMPLANT
DRAPE SHEET LG 3/4 BI-LAMINATE (DRAPES) ×3 IMPLANT
DRAPE U-SHAPE 47X51 STRL (DRAPES) ×3 IMPLANT
DURAPREP 26ML APPLICATOR (WOUND CARE) ×9 IMPLANT
ELECT REM PT RETURN 9FT ADLT (ELECTROSURGICAL) ×3
ELECTRODE REM PT RTRN 9FT ADLT (ELECTROSURGICAL) ×2 IMPLANT
GAUZE PETRO XEROFOAM 1X8 (MISCELLANEOUS) ×3 IMPLANT
GAUZE SPONGE 4X4 12PLY STRL (GAUZE/BANDAGES/DRESSINGS) ×6 IMPLANT
GLOVE BIOGEL PI IND STRL 9 (GLOVE) ×2 IMPLANT
GLOVE BIOGEL PI INDICATOR 9 (GLOVE) ×1
GLOVE SURG 9.0 ORTHO LTXF (GLOVE) ×6 IMPLANT
GOWN STRL REUS TWL 2XL XL LVL4 (GOWN DISPOSABLE) ×3 IMPLANT
GOWN STRL REUS W/ TWL LRG LVL3 (GOWN DISPOSABLE) ×2 IMPLANT
GOWN STRL REUS W/ TWL LRG LVL4 (GOWN DISPOSABLE) ×2 IMPLANT
GOWN STRL REUS W/TWL LRG LVL3 (GOWN DISPOSABLE) ×3
GOWN STRL REUS W/TWL LRG LVL4 (GOWN DISPOSABLE) ×3
IV LACTATED RINGER IRRG 3000ML (IV SOLUTION) ×18
IV LR IRRIG 3000ML ARTHROMATIC (IV SOLUTION) ×12 IMPLANT
KIT RM TURNOVER STRD PROC AR (KITS) ×3 IMPLANT
KIT STABILIZATION SHOULDER (MISCELLANEOUS) ×3 IMPLANT
KIT SUTURE 2.8 Q-FIX DISP (MISCELLANEOUS) ×3 IMPLANT
KIT SUTURETAK 3.0 INSERT PERC (KITS) IMPLANT
MANIFOLD NEPTUNE II (INSTRUMENTS) ×5 IMPLANT
MASK FACE SPIDER DISP (MASK) ×3 IMPLANT
MAT BLUE FLOOR 46X72 FLO (MISCELLANEOUS) ×3 IMPLANT
NDL SAFETY 18GX1.5 (NEEDLE) ×3 IMPLANT
NDL SAFETY 22GX1.5 (NEEDLE) ×3 IMPLANT
NS IRRIG 500ML POUR BTL (IV SOLUTION) ×3 IMPLANT
PACK ARTHROSCOPY SHOULDER (MISCELLANEOUS) ×3 IMPLANT
PAD WRAPON POLAR SHDR XLG (MISCELLANEOUS) ×2 IMPLANT
PASSER SUT CAPTURE FIRST (SUTURE) ×3 IMPLANT
SET TUBE SUCT SHAVER OUTFL 24K (TUBING) ×3 IMPLANT
SET TUBE TIP INTRA-ARTICULAR (MISCELLANEOUS) ×3 IMPLANT
STRAP SAFETY BODY (MISCELLANEOUS) ×4 IMPLANT
STRIP CLOSURE SKIN 1/2X4 (GAUZE/BANDAGES/DRESSINGS) ×3 IMPLANT
SUT ETHILON 4-0 (SUTURE) ×6
SUT ETHILON 4-0 FS2 18XMFL BLK (SUTURE) ×4
SUT KNTLS 2.8 MAGNUM (Anchor) ×11 IMPLANT
SUT LASSO 90 DEG SD STR (SUTURE) IMPLANT
SUT MNCRL 4-0 (SUTURE) ×3
SUT MNCRL 4-0 27XMFL (SUTURE) ×2
SUT PDS AB 0 CT1 27 (SUTURE) ×6 IMPLANT
SUT PERFECTPASSER WHITE CART (SUTURE) ×9 IMPLANT
SUT SMART STITCH CARTRIDGE (SUTURE) ×2 IMPLANT
SUT VIC AB 0 CT1 36 (SUTURE) ×6 IMPLANT
SUT VIC AB 2-0 CT2 27 (SUTURE) ×3 IMPLANT
SUTURE ETHLN 4-0 FS2 18XMF BLK (SUTURE) ×2 IMPLANT
SUTURE MAGNUM WIRE 2X48 BLK (SUTURE) ×4 IMPLANT
SUTURE MNCRL 4-0 27XMF (SUTURE) ×2 IMPLANT
SYRINGE 10CC LL (SYRINGE) ×3 IMPLANT
TAPE MICROFOAM 4IN (TAPE) ×3 IMPLANT
TUBING ARTHRO INFLOW-ONLY STRL (TUBING) ×3 IMPLANT
TUBING CONNECTING 10 (TUBING) ×3 IMPLANT
WAND HAND CNTRL MULTIVAC 90 (MISCELLANEOUS) ×3 IMPLANT
WRAPON POLAR PAD SHDR XLG (MISCELLANEOUS) ×3

## 2015-10-14 NOTE — Transfer of Care (Signed)
Immediate Anesthesia Transfer of Care Note  Patient: BRIT BOYAN  Procedure(s) Performed: Procedure(s): SHOULDER ARTHROSCOPY WITH OPEN ROTATOR CUFF REPAIR (Left) SUBACROMIAL DECOMPRESSION (Left) LYSIS OF ADHESION (Left) RESECTION DISTAL CLAVICAL (Left)  Patient Location: PACU  Anesthesia Type:General  Level of Consciousness: awake, alert , oriented and patient cooperative  Airway & Oxygen Therapy: Patient Spontanous Breathing and Patient connected to face mask oxygen  Post-op Assessment: Report given to RN, Post -op Vital signs reviewed and stable and Patient moving all extremities X 4  Post vital signs: Reviewed and stable  Last Vitals:  Vitals:   10/14/15 1204 10/14/15 1625  BP: 113/78 100/65  Pulse: 89 72  Resp: 16 12  Temp: 36.8 C 36.1 C    Last Pain:  Vitals:   10/14/15 1204  TempSrc: Oral  PainSc: 7          Complications: No apparent anesthesia complications

## 2015-10-14 NOTE — Progress Notes (Signed)
Arrived to Preop with brace on left wrist, states she has a sprain to the left wrist, brace removed for surgery.  States that she would like to be kept overnight for pain control following surgery, "I do not have anyone to help me and after my last rotator cuff surgery I had a really hard time".

## 2015-10-14 NOTE — Anesthesia Preprocedure Evaluation (Signed)
Anesthesia Evaluation  Patient identified by MRN, date of birth, ID band Patient awake    Reviewed: Allergy & Precautions, NPO status , Patient's Chart, lab work & pertinent test results  History of Anesthesia Complications Negative for: history of anesthetic complications  Airway Mallampati: II  TM Distance: >3 FB Neck ROM: Full    Dental  (+) Implants   Pulmonary asthma , sleep apnea (does not wear CPAP, cannot tolerate ) , COPD,  oxygen dependent, Current Smoker,    breath sounds clear to auscultation- rhonchi (-) wheezing      Cardiovascular Exercise Tolerance: Good (-) hypertension(-) CAD and (-) Past MI  Rhythm:Regular Rate:Normal - Systolic murmurs and - Diastolic murmurs    Neuro/Psych  Headaches, Anxiety Depression Bipolar Disorder negative neurological ROS     GI/Hepatic Neg liver ROS, GERD  ,  Endo/Other  negative endocrine ROSneg diabetes  Renal/GU CRFRenal disease     Musculoskeletal  (+) Arthritis , Fibromyalgia -  Abdominal (+) - obese,   Peds  Hematology negative hematology ROS (+)   Anesthesia Other Findings Past Medical History: No date: Abnormal Pap smear of cervix     Comment: 01/2015 ascus/neg- 04/2015 ascus/neg No date: Allergy No date: Anxiety No date: Arthritis No date: Asthma No date: Chronic kidney disease No date: COPD (chronic obstructive pulmonary disease) (* No date: DDD (degenerative disc disease), lumbar No date: Depression No date: Fibromyalgia No date: GERD (gastroesophageal reflux disease) No date: History of IBS No date: Joint disease No date: Migraine No date: Migraine No date: Oxygen deficiency No date: Restless leg syndrome No date: Sleep apnea     Comment: Does not use C-PAP, cannot tolerate mask   Reproductive/Obstetrics                             Anesthesia Physical Anesthesia Plan  ASA: III  Anesthesia Plan: General   Post-op  Pain Management:    Induction: Intravenous  Airway Management Planned: Oral ETT  Additional Equipment:   Intra-op Plan:   Post-operative Plan: Extubation in OR  Informed Consent: I have reviewed the patients History and Physical, chart, labs and discussed the procedure including the risks, benefits and alternatives for the proposed anesthesia with the patient or authorized representative who has indicated his/her understanding and acceptance.   Dental advisory given  Plan Discussed with: CRNA and Anesthesiologist  Anesthesia Plan Comments:         Anesthesia Quick Evaluation

## 2015-10-14 NOTE — Op Note (Signed)
10/14/2015  5:29 PM  PATIENT:  Briana [name]  50 y.o. female  PRE-OPERATIVE DIAGNOSIS:  1.  Partial thickness tear of left rotator cuff, left shoulder           2.  Left shoulder impingement          3.  Left acromioclavicular joint arthrosis  POST-OPERATIVE DIAGNOSIS:  1.  Adhesive Capsulitis             2.  High grade partial thickness tear left supraspinatus             3.  Subacromial impingement              4.  Acromioclavicular joint arthrosis             5.  Biceps tendonosis  PROCEDURE:  Procedure(s): SHOULDER ARTHROSCOPY WITH OPEN ROTATOR CUFF REPAIR (Left) SUBACROMIAL DECOMPRESSION (Left) LYSIS OF ADHESION (Left) RESECTION DISTAL CLAVICAL (Left)  SURGEON:  Surgeon(s) and Role:    * Thornton Park, MD - Primary  ANESTHESIA:   local and general   PREOPERATIVE INDICATIONS:  Briana [name] is a  50 y.o. female with persistent left shoulder pain with an MRI showing a partial thickness tear involving the supraspinatus who failed conservative treatment including corticosteroid injection and has elected for surgical management.  Patient has successfully undergone a similar procedure in 2016 for her right shoulder and has done well postop.  The risks benefits and alternatives were discussed with the patient preoperatively including but not limited to the risks of infection, bleeding, nerve injury, persistent pain or weakness, failure of the hardware, re-tear of the rotator cuff and the need for further surgery. Medical risks include DVT and pulmonary embolism, myocardial infarction, stroke, pneumonia, respiratory failure and death. Patient understood these risks and wished to proceed.  OPERATIVE IMPLANTS: ArthroCare Magnum 2 anchors x 3 & [employer]hew Q Fix anchor x 1  OPERATIVE PROCEDURE: The patient was met in the preoperative area with her brother and nephew at the bedside. The left shoulder was signed with the word yes and my initials according the hospital's correct site of  surgery protocol. The patient is brought to the OR and underwent general endotracheal intubation by the anesthesia service.  The patient was placed in a beachchair position. A spider arm positioner was used for this case. Examination under anesthesia revealed severe stiffness to the left shoulder.  Patient had forward elevation of 120, glenohumeral abduction of only 45 before scapular rotation, external rotation with the arm by her side of approximately 30 and with the arm abducted 60 she could externally rotate approximately 45 and internally rotate 30. Patient demonstrated no glenohumeral joint instability. Gentle manipulation of the left shoulder was attempted without improvement of her range of motion.  The patient was prepped and draped in a sterile fashion. A timeout was performed to verify the patient's name, date of birth, medical record number, correct site of surgery and correct procedure to be performed there was also used to verify the patient received antibiotics that all appropriate instruments, implants and radiographs studies were available in the room. Once all in attendance were in agreement case began.  Bony landmarks were drawn out with a surgical marker along with proposed arthroscopy incisions. These were pre-injected with 1% lidocaine plain. An 11 blade was used to establish a posterior portal through which the arthroscope was placed in the glenohumeral joint. Patient had an extremely tight glenohumeral joint. The anterior portal was established under direct  visualization with an 18-gauge spinal needle.  A 5.75 mm arthroscopic cannula was placed through the anterior portal.     A 90 ArthroCare wand was placed through the anterior portal. A lysis of adhesions and capsulotomy were performed until the subscapularis was identified. The scar tissue around the subscapularis was debrided with a 90 ArthroCare wand. Once the anterior capsulotomy was performed better visualization was  achieved.  Switching sticks were used to allow for placement of the arthroscope through the anterior portal. The 5.75 mm cannula was then placed through the posterior portal. Again a 90 ArthroCare wand was used to perform a posterior capsulotomy.  The intra-articular portion of the biceps tendon was observed to be extremely degenerative and flattened. The biceps tendon had lost elasticity   Given the extreme tendinosis and degenerative quality to the tendon the decision was made to perform a tenotomy and not attempt a tenodesis.  The 90 degree ArthroCare wand was used to release the biceps tendon off the superior labrum.   The arthroscope and instruments were then removed. The patient's left upper extremity was taken out of the spider arm positioner.  The patient's range of motion was now between 140 -150 of forward elevation, 60 of external rotation with the arm by her side.  The patient could achieve 110 abduction and in 90 of passive abduction could externally rotate to approximately 85 and internally rotate to 60.  The arthroscope was then traced back into the posterior portal. The glenohumeral joint was now less constricted. The arthroscopic shaver was then used to debride the frayed edges of the labrum. There were no anterior or superior labral tears seen.  The subscapularis tendon was intact. Patient had a high-grade partial thickness tear involving the supraspinatus. There were no loose bodies within the inferior recess and no evidence of HAGL lesion.  An 18-gauge spinal needle was then used to place a 0 PDS suture through the supraspinatus tear so it could be identified from the bursal surface.  The arthroscope was then placed in the subacromial space. A lateral portal was then established using an 18-gauge spinal needle for localization.   Abundant bursitis was encountered. A 4-0 resector shaver blade and 90 ArthroCare wand were used to perform an extensive debridement and bursectomy. The 0  PDS was identified. Frayed edges of the supraspinatus were identified at the site of the PDS suture. The 0 PDS suture was removed and a 4 resector shaver blade was used to complete the rotator cuff tear.  A subacromial decompression was also performed using a 5.5 mm resector shaver blade from the lateral portal. The 5.5 mm resector shaver blade was then placed through the anterior portal and distal clavicle excision was performed. A single ArthroCare Perfect Pass suture was placed in the lateral border of the rotator cuff tear. All arthroscopic instruments were then removed and the mini-open portion of the procedure began.   A saber-type incision was made along the lateral border of the acromion. The deltoid muscle was identified and split in line with its fibers which allowed visualization of the rotator cuff. The Perfect Pass suture previously placed in the lateral border of the rotator cuff werealso brought out through the deltoid split. The rotator cuff tear was easily visualized. A 5.5 mm resector shaver blade was used to prepare the greater tuberosity for repair. All torn fibers of the supraspinatus were debrided from the greater tuberosity footprint using this shaver blade. Punctate bleeding was identified. A single Q-Fix anchor was then placed  at the articular margin of the humeral head and greater tuberosity. The four suture limbs of the Q Fix anchor were passed medially through the rotator cuff using a first pass suture passer and clamped for later repair. Two additional The Perfect Pass sutures were placed through the lateral border of the rotator cuff tear. These sutures then anchored to thegreater tuberosity of the humeral head using three Magnum 2 anchors. These anchors were tensioned to allow for anatomic reduction of the rotator cuff to the greater tuberosity footprint. The medial row repair was then completed using an arthroscopic knot tying technique with the Q fix anchor sutures. Once  all sutures were tied down, arthroscopic images of the double row repair were taken with the arthroscope both externally and arthroscopically fromthe glenohumeral joint  All incisions were copiously irrigated. The deltoid fascia was repaired using a 0 Vicryl suturean interrupted fashion. The subcutaneous tissue of all incisions were closed with a 2-0 Vicryl. Skin closure for the arthroscopic incisions was performed with 4-0 nylon. The skin edges of the saber incision were approximated with a running 4-0 undyed Monocryl. 0.25%  Marcaine plain was injected into the subacromial space and at the injection sites.  A dry sterile dressing including Steri-Strips was applied . The patient was placed in an abduction sling, with a Polar Care sleeve.  All sharp and instrument counts were correct at the conclusion of the case. I was scrubbed and present for the entire case. I spoke with the patient's brother in the post-op consultation room and informed him that the case had been performed without complication and the patient was stable in recovery room.     Timoteo Gaul, MD

## 2015-10-14 NOTE — Anesthesia Procedure Notes (Signed)
Procedure Name: Intubation Date/Time: 10/14/2015 1:37 PM Performed by: Rosaria Ferries, Chamar Broughton Pre-anesthesia Checklist: Patient identified, Emergency Drugs available, Suction available and Patient being monitored Patient Re-evaluated:Patient Re-evaluated prior to inductionOxygen Delivery Method: Circle system utilized Preoxygenation: Pre-oxygenation with 100% oxygen Intubation Type: IV induction Laryngoscope Size: Mac and 3 Grade View: Grade I Tube size: 7.0 mm Number of attempts: 1 Placement Confirmation: ETT inserted through vocal cords under direct vision,  positive ETCO2 and breath sounds checked- equal and bilateral Secured at: 21 cm Tube secured with: Tape Dental Injury: Teeth and Oropharynx as per pre-operative assessment

## 2015-10-14 NOTE — H&P (Signed)
The patient has been re-examined, and the chart reviewed, and there have been no interval changes to the documented history and physical.    The risks, benefits, and alternatives have been discussed at length, and the patient is willing to proceed.   

## 2015-10-14 NOTE — Progress Notes (Signed)
  Subjective:  POST OP CHECK:  Patient seen in hospital room sitting up eating dinner.  Her RN is in the room.  Patient reports left shoulder pain as mild.  Objective:   VITALS:   Vitals:   10/14/15 1803 10/14/15 1832 10/14/15 1833 10/14/15 1937  BP: 109/65 114/74  124/87  Pulse: 82 84 84 91  Resp: 18 16  20   Temp: 97.7 F (36.5 C)  98.2 F (36.8 C) 98 F (36.7 C)  TempSrc: Oral  Oral Oral  SpO2: 100% 100% 100% 97%  Weight:      Height:        PHYSICAL EXAM:  Left shoulder:  Bandage is C/D/I.  She has left arm in the abduction sling. She is neurovascularly intact and has full digital ROM.     LABS  No results found for this or any previous visit (from the past 24 hour(s)).  No results found.  Assessment/Plan: Day of Surgery   Active Problems:   S/P rotator cuff repair  Patient is doing well post-op.  I explained to her the findings of her surgery.   SHe understands she had adhesive capsulitis in addition to her rotator cuff tear.   She will be observed overnight for post-op pain and neurovascular monitoring.  She will be evaluaated by PT/OT tomorrow.  Complete 24 hours of post-op antibiotics.  Anticipate discharge home tomorrow.    Thornton Park , MD 10/14/2015, 7:44 PM

## 2015-10-15 ENCOUNTER — Encounter: Payer: Self-pay | Admitting: Orthopedic Surgery

## 2015-10-15 DIAGNOSIS — S43429A Sprain of unspecified rotator cuff capsule, initial encounter: Secondary | ICD-10-CM | POA: Diagnosis present

## 2015-10-15 DIAGNOSIS — M7502 Adhesive capsulitis of left shoulder: Secondary | ICD-10-CM | POA: Diagnosis not present

## 2015-10-15 LAB — BASIC METABOLIC PANEL
ANION GAP: 5 (ref 5–15)
BUN: 16 mg/dL (ref 6–20)
CO2: 21 mmol/L — AB (ref 22–32)
CREATININE: 1 mg/dL (ref 0.44–1.00)
Calcium: 8.5 mg/dL — ABNORMAL LOW (ref 8.9–10.3)
Chloride: 111 mmol/L (ref 101–111)
GLUCOSE: 114 mg/dL — AB (ref 65–99)
Potassium: 4.3 mmol/L (ref 3.5–5.1)
Sodium: 137 mmol/L (ref 135–145)

## 2015-10-15 LAB — CBC
HEMATOCRIT: 34.5 % — AB (ref 35.0–47.0)
Hemoglobin: 11.8 g/dL — ABNORMAL LOW (ref 12.0–16.0)
MCH: 33.1 pg (ref 26.0–34.0)
MCHC: 34.3 g/dL (ref 32.0–36.0)
MCV: 96.4 fL (ref 80.0–100.0)
PLATELETS: 224 10*3/uL (ref 150–440)
RBC: 3.57 MIL/uL — ABNORMAL LOW (ref 3.80–5.20)
RDW: 14 % (ref 11.5–14.5)
WBC: 19.7 10*3/uL — AB (ref 3.6–11.0)

## 2015-10-15 MED ORDER — CLINDAMYCIN HCL 150 MG PO CAPS
600.0000 mg | ORAL_CAPSULE | Freq: Three times a day (TID) | ORAL | Status: DC
  Administered 2015-10-15: 600 mg via ORAL
  Filled 2015-10-15: qty 4

## 2015-10-15 MED ORDER — OXYCODONE HCL 10 MG PO TABS: 10.0000 mg | ORAL_TABLET | ORAL | 0 refills | Status: DC | PRN

## 2015-10-15 NOTE — Progress Notes (Signed)
Briana [name] to be D/C'd Home per MD order.  Discussed with the patient and all questions fully answered.  VSS, Skin clean, dry and intact without evidence of skin break down, no evidence of skin tears noted. IV catheter discontinued intact. Site without signs and symptoms of complications. Dressing and pressure applied.  An After Visit Summary was printed and given to the patient. Patient received prescription.  D/c education completed with patient/family including follow up instructions, medication list, d/c activities limitations if indicated, with other d/c instructions as indicated by MD - patient able to verbalize understanding, all questions fully answered.   Patient instructed to return to ED, call 911, or call MD for any changes in condition.   Patient escorted via [place], and D/C home via private auto.  Deri Fuelling 10/15/2015 2:35 PM

## 2015-10-15 NOTE — Evaluation (Signed)
Occupational Therapy Evaluation Patient Details Name: Briana [name] MRN: [id number] DOB: 10-07-1965 Today's Date: 10/15/2015    History of Present Illness Pt. is a 50 y.o. female who was admitted to [facility] for a left Rotator Cuff Repair.   Clinical Impression   Pt. Is a 50 y.o female who was admitted for Left Rotator Cuff Repair. Pt. Presents with pain, decreased ROM, and LUE immobilization which hinder her ability to complete ADL and IADL functioning. Pt. Could benefit from skilled OT services for ADL and A/E training, UE there. Ex, and pt. Education home modification. Pt. Education was provided about outpatient Rehab services.    Follow Up Recommendations  No OT follow up    Equipment Recommendations       Recommendations for Other Services       Precautions / Restrictions Precautions Precautions: Fall;Shoulder Precaution Booklet Issued: Yes (comment) Precaution Comments: No AROM of L shoulder Restrictions Weight Bearing Restrictions: Yes LUE Weight Bearing: Non weight bearing                       Transfers Overall transfer level: Independent Equipment used: None                 Balance Overall balance assessment: No apparent balance deficits (not formally assessed)                                          ADL Overall ADL's : Needs assistance/impaired Eating/Feeding: Set up   Grooming: Set up   Upper Body dressing: Moderate assistance;Minimal assitance           Lower Body Dressing: Supervision/safety               Functional mobility during ADLs: Independent General ADL Comments: Pt. education was provided about UE precautions, distal ROM, positioning of UE during for UE dressing.      Vision     Perception     Praxis      Pertinent Vitals/Pain Pain Assessment: 0-10 Pain Score: 5  Pain Descriptors / Indicators: Aching Pain Intervention(s): Monitored during session;Limited activity within patient's  tolerance     Hand Dominance Right   Extremity/Trunk Assessment Upper Extremity Assessment Upper Extremity Assessment: Generalized weakness       Communication Communication Communication: No difficulties   Cognition Arousal/Alertness: Awake/alert Behavior During Therapy: WFL for tasks assessed/performed Overall Cognitive Status: Within Functional Limits for tasks assessed                     General Comments       Exercises Pt. performed elbow flexion extension exercises, wrist flexion, extension, and digit flexion, extension.   Shoulder Instructions      Home Living Family/patient expects to be discharged to:: Private residence Living Arrangements: Alone   Type of Home: House Home Access: Stairs to enter [employer] of Steps: 3 Entrance Stairs-Rails: Can reach both Home Layout: One level     Bathroom Shower/Tub: Tub/shower unit;Walk-in shower;Curtain;Door Shower/tub characteristics: Curtain   Bathroom Accessibility: No   Home Equipment: Cane - single point          Prior Functioning/Environment Level of Independence: Independent        Comments: community ambulator    OT Diagnosis: Generalized weakness   OT Problem List: Decreased strength;Decreased range of motion;Pain;Impaired UE functional use;Decreased coordination;Decreased knowledge of precautions  OT Treatment/Interventions: Self-care/ADL training;Therapeutic exercise;Therapeutic activities;DME and/or AE instruction;Patient/family education    OT Goals(Current goals can be found in the care plan section) Acute Rehab OT Goals Patient Stated Goal: To return home ASAP. OT Goal Formulation: With patient Potential to Achieve Goals: Good  OT Frequency: Min 1X/week   Barriers to D/C:            Co-evaluation              End of Session    Activity Tolerance: Patient tolerated treatment well Patient left: in bed;with call bell/phone within reach   Time:  0930-1000 OT Time Calculation (min): 30 min Charges:  OT General Charges $OT Visit: 1 Procedure OT Evaluation $OT Eval Moderate Complexity: 1 Procedure OT Treatments $Therapeutic Exercise: 8-22 mins G-Codes:    Harrel Carina, MS, OTR/L 10/15/2015, 11:01 AM

## 2015-10-15 NOTE — Evaluation (Addendum)
Physical Therapy Evaluation Patient Details Name: Briana [name] MRN: [id number] DOB: [birth year] Today's Date: 10/15/2015   History of Present Illness  Pt admitted for partial thickness tear of L rotator cuff and adhesive capsulitis. Pt is now s/p shoulder arthroscopy with open rotator cuff repair.   Clinical Impression  Pt is a pleasant 50 year old female who was admitted for shoulder arthroscopy with rotator cuff repair. Pt performs  Transfers with independence and ambulation with cga and no AD. Pt also able to perform stair training with safe technique. Pt demonstrates deficits with strength/ROM/mobility/pain. Would benefit from skilled PT to address above deficits and promote optimal return to PLOF. Recommend OP PT at this time.      Follow Up Recommendations Outpatient PT    Equipment Recommendations  None recommended by PT    Recommendations for Other Services       Precautions / Restrictions Precautions Precautions: Fall;Shoulder Precaution Booklet Issued: Yes (comment) Precaution Comments: no AROM of L shoulder Restrictions Weight Bearing Restrictions: Yes LUE Weight Bearing: Non weight bearing      Mobility  Bed Mobility               General bed mobility comments: not performed as pt received standing in room upon arrival  Transfers Overall transfer level: Independent Equipment used: None             General transfer comment: Safe technique for transfers with no required use of arms  Ambulation/Gait Ambulation/Gait assistance: Min guard Ambulation Distance (Feet): 240 Feet Assistive device: None Gait Pattern/deviations: WFL(Within Functional Limits)     General Gait Details: ambulated using safe technique and reciprocal gait pattern. Pt somewhat guarded on L side secondary to sling. Good gait speed noted.  Stairs Stairs: Yes Stairs assistance: Supervision Stair Management: Alternating pattern Number of Stairs: 4 General stair comments: Pt  ambulated holding onto R rail going up and L rail coming down.  Safe technique performed.  Wheelchair Mobility    Modified Rankin (Stroke Patients Only)       Balance Overall balance assessment: No apparent balance deficits (not formally assessed)                                           Pertinent Vitals/Pain Pain Assessment: No/denies pain    Home Living Family/patient expects to be discharged to:: Private residence Living Arrangements: Alone   Type of Home: House Home Access: Stairs to enter Entrance Stairs-Rails: Can reach both Entrance Stairs-Number of Steps: 3 Home Layout: One level Home Equipment: None      Prior Function Level of Independence: Independent         Comments: community Geneticist, molecular        Extremity/Trunk Assessment   Upper Extremity Assessment: Generalized weakness (L UE hand/wrist/elbow grossly 3/5; R UE grossly 5/5)           Lower Extremity Assessment: Overall WFL for tasks assessed         Communication   Communication: No difficulties  Cognition Arousal/Alertness: Awake/alert Behavior During Therapy: WFL for tasks assessed/performed Overall Cognitive Status: Within Functional Limits for tasks assessed                      General Comments      Exercises Other Exercises Other Exercises: Pt performed seated ther-ex on L UE  including hand squeezes, wrist circles, and elbow flexion/extension. Written HEP given to pt. All ther-ex performed x 10 reps with cga and cues for correct technique.      Assessment/Plan    PT Assessment Patient needs continued PT services  PT Diagnosis Difficulty walking;Abnormality of gait;Generalized weakness;Acute pain   PT Problem List Decreased strength;Decreased range of motion;Decreased activity tolerance;Decreased balance;Decreased mobility;Pain  PT Treatment Interventions DME instruction;Gait training;Therapeutic exercise   PT Goals (Current  goals can be found in the Care Plan section) Acute Rehab PT Goals Patient Stated Goal: to get stronger PT Goal Formulation: With patient Time For Goal Achievement: 10/29/15 Potential to Achieve Goals: Good    Frequency BID   Barriers to discharge        Co-evaluation               End of Session Equipment Utilized During Treatment:  (shoulder sling) Activity Tolerance: Patient tolerated treatment well Patient left: in bed Nurse Communication: Mobility status         Time: 1610-9604 PT Time Calculation (min) (ACUTE ONLY): 18 min   Charges:   PT Evaluation $PT Eval Moderate Complexity: 1 Procedure PT Treatments $Therapeutic Exercise: 8-22 mins   PT G Codes:        Kaylyn Garrow 10/31/2015, 10:50 AM Elizabeth Palau, PT, DPT [phone]

## 2015-10-15 NOTE — Care Management (Signed)
POD 1 s/p left shoulder rotator cuff repair. It is anticipated that patient will be discharged home today. PT recommending OP PT. Patient wants to go to [place] for her PT. Will fax referral down to rehab center once MD signs. Spoke with rehab center and they will call patient with an appointment time. Pharmacy: [facility]: [place] [phone]).

## 2015-10-15 NOTE — Discharge Summary (Signed)
Physician Discharge Summary  Patient ID: Briana [name] MRN: [id number] DOB/AGE: 1965/05/16 50 y.o.  Admit date: 10/14/2015 Discharge date: 10/15/2015  Admission Diagnoses:  1.  Status post left shoulder rotator cuff repair 2.  Left shoulder adhesive capsulitis 3.  Chronic pain 4.  Postoperative pain  Discharge Diagnoses:   Active Problems:   S/P rotator cuff repair   Rotator cuff (capsule) sprain Left shoulder adhesive capsulitis Chronic pain Postoperative pain  Past Medical History:  Diagnosis Date  . Abnormal Pap smear of cervix    01/2015 ascus/neg- 04/2015 ascus/neg  . Allergy   . Anxiety   . Arthritis   . Asthma   . Chronic kidney disease   . COPD (chronic obstructive pulmonary disease) ([place])   . DDD (degenerative disc disease), lumbar   . Depression   . Fibromyalgia   . GERD (gastroesophageal reflux disease)   . History of IBS   . Hyperthyroidism   . Joint disease   . Medical history non-contributory   . Migraine   . Migraine   . Oxygen deficiency   . Restless leg syndrome   . Sleep apnea    Does not use C-PAP, cannot tolerate mask    Surgeries: Procedure(s): SHOULDER ARTHROSCOPY WITH OPEN ROTATOR CUFF REPAIR SUBACROMIAL DECOMPRESSION LYSIS OF ADHESION RESECTION DISTAL CLAVICAL on 10/14/2015   Consultants (if any):   Discharged Condition: Improved  Hospital Course: Briana [name] is an 50 y.o. female who was admitted 10/14/2015 with a diagnosis of Left shoulder rotator cuff tear with adhesive capsulitis and went to the operating room on 10/14/2015 and underwent lysis of adhesions and repair of the left shoulder rotator cuff tear.  She was admitted postoperatively for pain control and neurovascular monitoring. She received 24 hours of postop antibiotics. Patient currently did well postop and was prepared for discharge on postoperative day 1. Patient remained in an abduction sling throughout her hospitalization and instructed to continue wearing her sling at all  times at home.Marland Kitchen    She was given perioperative antibiotics:  Anti-infectives    Start     Dose/Rate Route Frequency Ordered Stop   10/15/15 1100  clindamycin (CLEOCIN) capsule 600 mg     600 mg Oral Every 8 hours 10/15/15 1014     10/14/15 2000  clindamycin (CLEOCIN) IVPB 600 mg     600 mg 100 mL/hr over 30 Minutes Intravenous Every 6 hours 10/14/15 1812 10/15/15 1359   10/14/15 1147  clindamycin (CLEOCIN) 600 MG/50ML IVPB    Comments:  Machia, Millissa: cabinet override      10/14/15 1147 10/14/15 2359   10/14/15 0215  clindamycin (CLEOCIN) IVPB 600 mg  Status:  Discontinued     600 mg 100 mL/hr over 30 Minutes Intravenous  Once 10/14/15 0203 10/14/15 1812    .  She benefited maximally from the hospital stay and there were no complications.    Recent vital signs:  Vitals:   10/15/15 0748 10/15/15 0749  BP: (!) 98/58 115/74  Pulse: 63 60  Resp:    Temp: 98.2 F (36.8 C)     Recent laboratory studies:  Lab Results  Component Value Date   HGB 11.8 (L) 10/15/2015   HGB 14.5 10/07/2015   HGB 13.5 08/25/2014   Lab Results  Component Value Date   WBC 19.7 (H) 10/15/2015   PLT 224 10/15/2015   Lab Results  Component Value Date   INR 0.96 10/07/2015   Lab Results  Component Value Date   NA 137  10/15/2015   K 4.3 10/15/2015   CL 111 10/15/2015   CO2 21 (L) 10/15/2015   BUN 16 10/15/2015   CREATININE 1.00 10/15/2015   GLUCOSE 114 (H) 10/15/2015    Discharge Medications:     Medication List    STOP taking these medications   clindamycin 1 % external solution Commonly known as:  CLEOCIN T   hydrocortisone 2.5 % ointment   triamcinolone cream 0.1 % Commonly known as:  KENALOG     TAKE these medications   albuterol 0.63 MG/3ML nebulizer solution Commonly known as:  ACCUNEB Take 1 ampule by nebulization every 6 (six) hours as needed for wheezing.   BELSOMRA 10 MG Tabs Generic drug:  Suvorexant Take 10 mg by mouth as needed.   chlorzoxazone 500 MG  tablet Commonly known as:  PARAFON Limit 1 tab by mouth per day or twice a day to 3 times a day if tolerated   cholecalciferol 1000 units tablet Commonly known as:  VITAMIN D Take 2,000 Units by mouth daily.   ciprofloxacin 500 MG tablet Commonly known as:  CIPRO Limit 1 tab by mouth twice a day if tolerated   clindamycin-benzoyl peroxide gel Commonly known as:  BENZACLIN Apply 1 application topically as needed. Reported on 08/11/2015   EPINEPHrine 0.15 MG/0.3ML injection Commonly known as:  EPIPEN JR Inject 0.15 mg into the muscle as needed for anaphylaxis.   estrogen (conjugated)-medroxyprogesterone 0.3-1.5 MG tablet Commonly known as:  PREMPRO Take 1 tablet by mouth daily.   fluocinonide 0.05 % external solution Commonly known as:  LIDEX Apply 1 application topically as needed.   fluticasone 50 MCG/ACT nasal spray Commonly known as:  FLONASE Place 1 spray into both nostrils daily.   HAIR SKIN AND NAILS FORMULA Tabs Take 2 tablets by mouth daily.   ketoconazole 2 % shampoo Commonly known as:  NIZORAL Apply 1 application topically 2 (two) times a week.   lidocaine 5 % Commonly known as:  LIDODERM Apply 1-2 patches to skin for 12 hours then remove for 12 hours and repeat process if tolerated What changed:  Another medication with the same name was removed. Continue taking this medication, and follow the directions you see here.   LINZESS 290 MCG Caps capsule Generic drug:  linaclotide Take 290 mcg by mouth as needed.   LORazepam 0.5 MG tablet Commonly known as:  ATIVAN Take 0.5 mg by mouth every 8 (eight) hours.   magnesium oxide 400 MG tablet Commonly known as:  MAG-OX Take 400 mg by mouth daily. Reported on 07/08/2015   MOVANTIK 25 MG Tabs tablet Generic drug:  naloxegol oxalate Take 25 mg by mouth daily. Reported on 08/11/2015   omeprazole 20 MG capsule Commonly known as:  PRILOSEC Take 20 mg by mouth daily.   Oxycodone HCl 10 MG Tabs Take 1 tablet (10  mg total) by mouth every 4 (four) hours as needed for breakthrough pain. What changed:  how much to take  how to take this  when to take this  reasons to take this  additional instructions   promethazine 25 MG suppository Commonly known as:  PHENERGAN Place 25 mg rectally every 6 (six) hours as needed for nausea or vomiting. Reported on 07/08/2015   rosuvastatin 40 MG tablet Commonly known as:  CRESTOR Take 40 mg by mouth daily.   tiotropium 18 MCG inhalation capsule Commonly known as:  SPIRIVA Place 18 mcg into inhaler and inhale daily.   topiramate 100 MG tablet Commonly known as:  TOPAMAX Take 100 mg by mouth 2 (two) times daily.   traZODone 100 MG tablet Commonly known as:  DESYREL Take 200 mg by mouth daily. Reported on 08/11/2015   vitamin E 200 UNIT capsule Generic drug:  vitamin E Take 200 Units by mouth daily.       Diagnostic Studies: No results found.  Disposition: 01-Home or Self Care  Discharge Instructions    Call MD / Call 911    Complete by:  As directed    If you experience chest pain or shortness of breath, CALL 911 and be transported to the hospital emergency room.  If you develope a fever above 101 F, pus (white drainage) or increased drainage or redness at the wound, or calf pain, call your surgeon's office.   Constipation Prevention    Complete by:  As directed    Drink plenty of fluids.  Prune juice may be helpful.  You may use a stool softener, such as Colace (over the counter) 100 mg twice a day.  Use MiraLax (over the counter) for constipation as needed.   Diet - low sodium heart healthy    Complete by:  As directed    Discharge instructions    Complete by:  As directed    Wear sling at all times, including sleep.  You will need to use the sling for a total of 4 weeks following surgery.  Do not try and lift your arm away from your body for any reason.   Keep the dressing dry.  You may remove bandage in 3 days.  Leave the Steri-Strips  (white medical tape) in place.  You may place additional Band-Aids over top of the Steri-Strips if you wish.  May shower once dressing is removed in 3 days.  Remove sling carefully only for showers, leaving arm down by your side while in the shower.  Make sure to take some pain medication this evening before you fall asleep, in preparation for the nerve block wearing off in the middle of the night.  If the the pain medication causes itching, or is too strong, try taking a single tablet at a time, or combining with Benadryl.  You may be most comfortable sleeping in a recliner.  If you do sleep in near bed, placed pillows behind the shoulder that have the operation to support it.   Driving restrictions    Complete by:  As directed    No driving for 4 weeks   Increase activity slowly as tolerated    Complete by:  As directed    Lifting restrictions    Complete by:  As directed    No lifting for 12-16 weeks     Patient will follow up with Dr. Mack Guise on 10/22/2015 at 4:15 PM.    Signed: Thornton Park ,MD 10/15/2015, 2:07 PM

## 2015-10-15 NOTE — Progress Notes (Signed)
  Subjective:  POD #1 s/p left shoulder rotator cuff repair.  Patient reports pain as mild.  Patient has no complaints morning and is ready to go home.  Objective:   VITALS:   Vitals:   10/14/15 2239 10/15/15 0409 10/15/15 0748 10/15/15 0749  BP: 102/68 109/68 (!) 98/58 115/74  Pulse: 82 (!) 54 63 60  Resp: 16 16    Temp: 98 F (36.7 C) 98.2 F (36.8 C) 98.2 F (36.8 C)   TempSrc: Oral Oral Oral   SpO2: 99% 100% 99%   Weight:      Height:        PHYSICAL EXAM:  Left shoulder: Patient's dressing is clean dry and intact. She has intact sensation to light touch throughout the left upper extremity. She has full digital and wrist range of motion. She is a palpable radial pulse. Her left upper extremity is in an abduction sling.   LABS  Results for orders placed or performed during the hospital encounter of 10/14/15 (from the past 24 hour(s))  CBC     Status: Abnormal   Collection Time: 10/15/15  3:31 AM  Result Value Ref Range   WBC 19.7 (H) 3.6 - 11.0 K/uL   RBC 3.57 (L) 3.80 - 5.20 MIL/uL   Hemoglobin 11.8 (L) 12.0 - 16.0 g/dL   HCT 34.5 (L) 35.0 - 47.0 %   MCV 96.4 80.0 - 100.0 fL   MCH 33.1 26.0 - 34.0 pg   MCHC 34.3 32.0 - 36.0 g/dL   RDW 14.0 11.5 - 14.5 %   Platelets 224 150 - 440 K/uL  Basic metabolic panel     Status: Abnormal   Collection Time: 10/15/15  3:31 AM  Result Value Ref Range   Sodium 137 135 - 145 mmol/L   Potassium 4.3 3.5 - 5.1 mmol/L   Chloride 111 101 - 111 mmol/L   CO2 21 (L) 22 - 32 mmol/L   Glucose, Bld 114 (H) 65 - 99 mg/dL   BUN 16 6 - 20 mg/dL   Creatinine, Ser 1.00 0.44 - 1.00 mg/dL   Calcium 8.5 (L) 8.9 - 10.3 mg/dL   GFR calc non Af Amer >60 >60 mL/min   GFR calc Af Amer >60 >60 mL/min   Anion gap 5 5 - 15    No results found.  Assessment/Plan: 1 Day Post-Op   Active Problems:   S/P rotator cuff repair  Patient is doing well postop. She is ready for discharge home. Patient will have outpatient physical therapy set up after  her first postoperative visit in my office in 1 week.    Thornton Park , MD 10/15/2015, 1:50 PM

## 2015-10-22 NOTE — Progress Notes (Signed)
   10/15/15 1101  Acute Rehab OT Goals  Patient Stated Goal To return home ASAP.  OT Goal Formulation With patient  Potential to Achieve Goals Good  OT Time Calculation  OT Start Time (ACUTE ONLY) 0930  OT Stop Time (ACUTE ONLY) 1000  OT Time Calculation (min) 30 min  OT G-codes **NOT FOR INPATIENT CLASS**  Functional Assessment Tool Used Clinical judgement based on pt. current functional status  Functional Limitation Self care  Self Care Current Status [id number]) CJ  Self Care Goal Status [id number]) CI  OT General Charges  $OT Visit 1 Procedure  OT Evaluation  $OT Eval Moderate Complexity 1 Procedure  OT Treatments  $Therapeutic Exercise 8-22 mins  10/22/15: Late entry G-code entry completed by Harrel Carina, MS, OTR/L. Pt. Was assessed was completed by Harrel Carina, MS, OTR/L on 10/15/15.

## 2015-10-23 ENCOUNTER — Ambulatory Visit: Payer: Medicare Other | Attending: Orthopedic Surgery | Admitting: Physical Therapy

## 2015-10-23 ENCOUNTER — Encounter: Payer: Self-pay | Admitting: Physical Therapy

## 2015-10-23 VITALS — BP 108/71 | HR 90

## 2015-10-23 DIAGNOSIS — M6281 Muscle weakness (generalized): Secondary | ICD-10-CM | POA: Insufficient documentation

## 2015-10-23 DIAGNOSIS — M25511 Pain in right shoulder: Secondary | ICD-10-CM | POA: Insufficient documentation

## 2015-10-23 DIAGNOSIS — R29898 Other symptoms and signs involving the musculoskeletal system: Secondary | ICD-10-CM | POA: Insufficient documentation

## 2015-10-23 DIAGNOSIS — M25512 Pain in left shoulder: Secondary | ICD-10-CM | POA: Diagnosis present

## 2015-10-23 NOTE — Progress Notes (Signed)
10/15/15 1020  PT Visit Information  Last PT Received On 10/15/15  Assistance Needed +1  History of Present Illness Pt admitted for partial thickness tear of L rotator cuff and adhesive capsulitis. Pt is now s/p shoulder arthroscopy with open rotator cuff repair.   Precautions  Precautions Fall;Shoulder  Precaution Booklet Issued Yes (comment)  Precaution Comments no AROM of L shoulder  Restrictions  Weight Bearing Restrictions Yes  LUE Weight Bearing NWB  Home Living  Family/patient expects to be discharged to: Private residence  Living Arrangements Alone  Type of [place] to enter  Entrance Stairs-Number of Steps 3  Entrance Stairs-Rails Can reach both  Home Layout One level  [place] None  Prior Function  Level of Independence Independent  Comments community ambulator  Communication  Communication No difficulties  Pain Assessment  Pain Assessment No/denies pain  Cognition  Arousal/Alertness Awake/alert  Behavior During Therapy WFL for tasks assessed/performed  Overall Cognitive Status Within Functional Limits for tasks assessed  Upper Extremity Assessment  Upper Extremity Assessment Generalized weakness (L UE hand/wrist/elbow grossly 3/5; R UE grossly 5/5)  Lower Extremity Assessment  Lower Extremity Assessment Overall WFL for tasks assessed  Bed Mobility  General bed mobility comments not performed as pt received standing in room upon arrival  Transfers  Overall transfer level Independent  Equipment used None  General transfer comment Safe technique for transfers with no required use of arms  Ambulation/Gait  Ambulation/Gait assistance Min guard  Ambulation Distance (Feet) 240 Feet  Assistive device None  Gait Pattern/deviations WFL(Within Functional Limits)  General Gait Details ambulated using safe technique and reciprocal gait pattern. Pt somewhat guarded on L side secondary to sling. Good gait speed noted.  Stairs Yes  Stairs  assistance Supervision  Stair Management Alternating pattern  Number of Stairs 4  General stair comments Pt ambulated holding onto R rail going up and L rail coming down.  Safe technique performed.  Balance  Overall balance assessment No apparent balance deficits (not formally assessed)  Exercises  Exercises Other exercises  Other Exercises  Other Exercises Pt performed seated ther-ex on L UE including hand squeezes, wrist circles, and elbow flexion/extension. Written HEP given to pt. All ther-ex performed x 10 reps with cga and cues for correct technique.  PT - End of Session  Equipment Utilized During Treatment (shoulder sling)  Activity Tolerance Patient tolerated treatment well  Patient left in bed  Nurse Communication Mobility status  PT Assessment  PT Recommendation/Assessment Patient needs continued PT services  PT Problem List Decreased strength;Decreased range of motion;Decreased activity tolerance;Decreased balance;Decreased mobility;Pain  PT Therapy Diagnosis  Difficulty walking;Abnormality of gait;Generalized weakness;Acute pain  PT Plan  PT Frequency (ACUTE ONLY) BID  PT Treatment/Interventions (ACUTE ONLY) DME instruction;Gait training;Therapeutic exercise  PT Recommendation  Follow Up Recommendations Outpatient PT  PT equipment None recommended by PT  Individuals Consulted  Consulted and Agree with Results and Recommendations Patient  Acute Rehab PT Goals  Patient Stated Goal to get stronger  PT Goal Formulation With patient  Time For Goal Achievement 10/29/15  Potential to Achieve Goals Good  PT Time Calculation  PT Start Time (ACUTE ONLY) 0857  PT Stop Time (ACUTE ONLY) 0915  PT Time Calculation (min) (ACUTE ONLY) 18 min  PT G-Codes **NOT FOR INPATIENT CLASS**  Functional Assessment Tool Used clinical judgement  Functional Limitation Mobility: Walking and moving around  Mobility: Walking and Moving Around Current Status [id number]) CI  Mobility: Walking and Moving  Around Goal Status [id number]) [place]  PT General Charges  $$ ACUTE PT VISIT 1 Procedure  PT Evaluation  $PT Eval Moderate Complexity 1 Procedure  PT Treatments  $Therapeutic Exercise 8-22 mins  Greggory Stallion, PT, DPT [phone]

## 2015-10-23 NOTE — Patient Instructions (Addendum)
AROM: Forearm Pronation / Supination    You can perform this exercise while in your brace.  Slowly rotate palm down until light stretch is felt. Do not push into pain.  Relax. Then rotate palm up until light stretch is felt. Repeat 15 times per set. Do 3 sets per session. Do 3 sessions per day.  Copyright  VHI. All rights reserved.  AROM: Finger Flexion / Extension    Actively bend fingers of left hand until you make a fist. Hold 3 seconds. Relax. Then straighten fingers as far as possible. Repeat 15 times per set. Do 3 sets per session. Do 3 sessions per day.  Copyright  VHI. All rights reserved.    You can continue with your ball squeezes.
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
## 2015-10-26 NOTE — Anesthesia Postprocedure Evaluation (Signed)
Anesthesia Post Note  Patient: Briana [name]  Procedure(s) Performed: Procedure(s) (LRB): SHOULDER ARTHROSCOPY WITH OPEN ROTATOR CUFF REPAIR (Left) SUBACROMIAL DECOMPRESSION (Left) LYSIS OF ADHESION (Left) RESECTION DISTAL CLAVICAL (Left)  Patient location during evaluation: PACU Anesthesia Type: General Level of consciousness: awake and alert Pain management: pain level controlled Vital Signs Assessment: post-procedure vital signs reviewed and stable Respiratory status: spontaneous breathing, nonlabored ventilation, respiratory function stable and patient connected to nasal cannula oxygen Cardiovascular status: blood pressure returned to baseline and stable Postop Assessment: no signs of nausea or vomiting Anesthetic complications: no    Last Vitals:  Vitals:   10/15/15 0749 10/15/15 1405  BP: 115/74 (!) 115/91  Pulse: 60 69  Resp:  18  Temp:      Last Pain:  Vitals:   10/15/15 1215  TempSrc:   PainSc: [place] [name]

## 2015-10-27 ENCOUNTER — Ambulatory Visit: Payer: Medicare Other

## 2015-10-27 DIAGNOSIS — R29898 Other symptoms and signs involving the musculoskeletal system: Secondary | ICD-10-CM

## 2015-10-27 DIAGNOSIS — M25512 Pain in left shoulder: Secondary | ICD-10-CM | POA: Diagnosis not present

## 2015-10-27 DIAGNOSIS — M25511 Pain in right shoulder: Secondary | ICD-10-CM
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
## 2015-10-29 ENCOUNTER — Ambulatory Visit: Payer: Medicare Other

## 2015-10-29 DIAGNOSIS — M25512 Pain in left shoulder: Secondary | ICD-10-CM | POA: Diagnosis not present

## 2015-10-29 DIAGNOSIS — R29898 Other symptoms and signs involving the musculoskeletal system: Secondary | ICD-10-CM
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
## 2015-11-04 ENCOUNTER — Ambulatory Visit: Payer: Medicare Other | Attending: Orthopedic Surgery

## 2015-11-04 DIAGNOSIS — R29898 Other symptoms and signs involving the musculoskeletal system: Secondary | ICD-10-CM | POA: Diagnosis present

## 2015-11-04 DIAGNOSIS — M25511 Pain in right shoulder: Secondary | ICD-10-CM | POA: Diagnosis present

## 2015-11-04 DIAGNOSIS — M25612 Stiffness of left shoulder, not elsewhere classified: Secondary | ICD-10-CM | POA: Diagnosis present

## 2015-11-04 DIAGNOSIS — M25512 Pain in left shoulder: Secondary | ICD-10-CM | POA: Insufficient documentation
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
## 2015-11-09 ENCOUNTER — Other Ambulatory Visit: Payer: Self-pay | Admitting: Pain Medicine

## 2015-11-12 ENCOUNTER — Ambulatory Visit: Payer: Medicare Other

## 2015-11-12 DIAGNOSIS — M25512 Pain in left shoulder: Secondary | ICD-10-CM

## 2015-11-12 DIAGNOSIS — R29898 Other symptoms and signs involving the musculoskeletal system: Secondary | ICD-10-CM
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
## 2015-11-16 ENCOUNTER — Ambulatory Visit: Payer: Medicare Other

## 2015-11-16 DIAGNOSIS — M25512 Pain in left shoulder: Secondary | ICD-10-CM

## 2015-11-16 DIAGNOSIS — R29898 Other symptoms and signs involving the musculoskeletal system: Secondary | ICD-10-CM
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
## 2015-11-18 ENCOUNTER — Ambulatory Visit: Payer: Medicare Other

## 2015-11-18 DIAGNOSIS — M25512 Pain in left shoulder: Secondary | ICD-10-CM | POA: Diagnosis not present
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
## 2015-11-23 ENCOUNTER — Ambulatory Visit: Payer: Medicare Other

## 2015-11-23 ENCOUNTER — Other Ambulatory Visit: Payer: Self-pay | Admitting: Nurse Practitioner

## 2015-11-23 DIAGNOSIS — R29898 Other symptoms and signs involving the musculoskeletal system: Secondary | ICD-10-CM

## 2015-11-23 DIAGNOSIS — M25512 Pain in left shoulder: Secondary | ICD-10-CM

## 2015-11-23 DIAGNOSIS — Z1231 Encounter for screening mammogram for malignant neoplasm of breast: Secondary | ICD-10-CM
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
## 2015-11-25 ENCOUNTER — Ambulatory Visit: Payer: Medicare Other

## 2015-11-25 DIAGNOSIS — M25512 Pain in left shoulder: Secondary | ICD-10-CM

## 2015-11-25 DIAGNOSIS — M25612 Stiffness of left shoulder, not elsewhere classified: Secondary | ICD-10-CM
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
## 2015-11-30 ENCOUNTER — Ambulatory Visit: Payer: Medicare Other

## 2015-11-30 DIAGNOSIS — M25512 Pain in left shoulder: Secondary | ICD-10-CM | POA: Diagnosis not present

## 2015-11-30 DIAGNOSIS — M25612 Stiffness of left shoulder, not elsewhere classified: Secondary | ICD-10-CM

## 2015-12-01 ENCOUNTER — Ambulatory Visit: Payer: Medicare Other
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
## 2015-12-02 ENCOUNTER — Ambulatory Visit: Payer: Medicare Other | Attending: Orthopedic Surgery

## 2015-12-02 DIAGNOSIS — M25612 Stiffness of left shoulder, not elsewhere classified: Secondary | ICD-10-CM | POA: Diagnosis present

## 2015-12-02 DIAGNOSIS — R29898 Other symptoms and signs involving the musculoskeletal system: Secondary | ICD-10-CM | POA: Diagnosis present

## 2015-12-02 DIAGNOSIS — M25512 Pain in left shoulder: Secondary | ICD-10-CM | POA: Diagnosis present
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
## 2015-12-07 ENCOUNTER — Ambulatory Visit: Payer: Medicare Other

## 2015-12-07 VITALS — BP 99/80 | HR 85

## 2015-12-07 DIAGNOSIS — M25612 Stiffness of left shoulder, not elsewhere classified: Secondary | ICD-10-CM

## 2015-12-07 DIAGNOSIS — M25512 Pain in left shoulder: Secondary | ICD-10-CM | POA: Diagnosis not present
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
## 2015-12-09 ENCOUNTER — Ambulatory Visit: Payer: Medicare Other

## 2015-12-09 DIAGNOSIS — M25512 Pain in left shoulder: Secondary | ICD-10-CM | POA: Diagnosis not present

## 2015-12-09 DIAGNOSIS — M25612 Stiffness of left shoulder, not elsewhere classified: Secondary | ICD-10-CM
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
## 2015-12-16 ENCOUNTER — Ambulatory Visit: Payer: Medicare Other

## 2015-12-16 DIAGNOSIS — M25612 Stiffness of left shoulder, not elsewhere classified: Secondary | ICD-10-CM

## 2015-12-16 DIAGNOSIS — M25512 Pain in left shoulder: Secondary | ICD-10-CM

## 2015-12-16 DIAGNOSIS — R29898 Other symptoms and signs involving the musculoskeletal system: Secondary | ICD-10-CM
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
## 2015-12-22 ENCOUNTER — Ambulatory Visit: Payer: Medicare Other

## 2015-12-22 DIAGNOSIS — M25612 Stiffness of left shoulder, not elsewhere classified: Secondary | ICD-10-CM

## 2015-12-22 DIAGNOSIS — M25512 Pain in left shoulder: Secondary | ICD-10-CM
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
## 2015-12-29 ENCOUNTER — Ambulatory Visit: Payer: Medicare Other

## 2015-12-29 DIAGNOSIS — M25512 Pain in left shoulder: Secondary | ICD-10-CM

## 2015-12-29 DIAGNOSIS — M25612 Stiffness of left shoulder, not elsewhere classified: Secondary | ICD-10-CM
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
## 2015-12-30 ENCOUNTER — Ambulatory Visit: Payer: Medicare Other

## 2015-12-30 DIAGNOSIS — M25512 Pain in left shoulder: Secondary | ICD-10-CM | POA: Diagnosis not present

## 2015-12-30 DIAGNOSIS — M25612 Stiffness of left shoulder, not elsewhere classified: Secondary | ICD-10-CM
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
## 2015-12-31 ENCOUNTER — Ambulatory Visit
Admission: RE | Admit: 2015-12-31 | Discharge: 2015-12-31 | Disposition: A | Discharge status: Home / Self Care | Payer: Medicare Other | Source: Ambulatory Visit | Attending: Nurse Practitioner | Admitting: Nurse Practitioner

## 2015-12-31 DIAGNOSIS — Z1231 Encounter for screening mammogram for malignant neoplasm of breast: Secondary | ICD-10-CM | POA: Insufficient documentation

## 2016-01-04 ENCOUNTER — Ambulatory Visit: Payer: Medicare Other | Attending: Orthopedic Surgery

## 2016-01-04 DIAGNOSIS — R29898 Other symptoms and signs involving the musculoskeletal system: Secondary | ICD-10-CM

## 2016-01-04 DIAGNOSIS — M25612 Stiffness of left shoulder, not elsewhere classified: Secondary | ICD-10-CM

## 2016-01-04 DIAGNOSIS — M25512 Pain in left shoulder: Secondary | ICD-10-CM
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
## 2016-01-06 ENCOUNTER — Ambulatory Visit: Payer: Medicare Other

## 2016-01-06 DIAGNOSIS — M25612 Stiffness of left shoulder, not elsewhere classified: Secondary | ICD-10-CM | POA: Diagnosis not present

## 2016-01-06 DIAGNOSIS — M25512 Pain in left shoulder: Secondary | ICD-10-CM
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
## 2016-01-11 ENCOUNTER — Ambulatory Visit: Payer: Medicare Other

## 2016-01-11 DIAGNOSIS — M25612 Stiffness of left shoulder, not elsewhere classified: Secondary | ICD-10-CM

## 2016-01-11 DIAGNOSIS — M25512 Pain in left shoulder: Secondary | ICD-10-CM
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
## 2016-01-13 ENCOUNTER — Ambulatory Visit: Payer: Medicare Other

## 2016-01-13 DIAGNOSIS — M25612 Stiffness of left shoulder, not elsewhere classified: Secondary | ICD-10-CM

## 2016-01-13 DIAGNOSIS — M25512 Pain in left shoulder: Secondary | ICD-10-CM
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
## 2016-01-18 ENCOUNTER — Ambulatory Visit: Payer: Medicare Other

## 2016-01-18 DIAGNOSIS — M25512 Pain in left shoulder: Secondary | ICD-10-CM

## 2016-01-18 DIAGNOSIS — M25612 Stiffness of left shoulder, not elsewhere classified: Secondary | ICD-10-CM | POA: Diagnosis not present
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
## 2016-01-20 ENCOUNTER — Ambulatory Visit: Payer: Medicare Other

## 2016-01-20 DIAGNOSIS — M25612 Stiffness of left shoulder, not elsewhere classified: Secondary | ICD-10-CM

## 2016-01-20 DIAGNOSIS — M25512 Pain in left shoulder: Secondary | ICD-10-CM
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
## 2016-01-27 ENCOUNTER — Ambulatory Visit: Payer: Medicare Other

## 2016-01-27 DIAGNOSIS — M25612 Stiffness of left shoulder, not elsewhere classified: Secondary | ICD-10-CM | POA: Diagnosis not present

## 2016-01-27 DIAGNOSIS — M25512 Pain in left shoulder: Secondary | ICD-10-CM
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
## 2016-02-04 ENCOUNTER — Ambulatory Visit: Payer: Medicare Other | Admitting: Obstetrics and Gynecology

## 2016-02-25 ENCOUNTER — Ambulatory Visit: Payer: Medicare Other | Admitting: Obstetrics and Gynecology

## 2016-03-01 ENCOUNTER — Other Ambulatory Visit: Payer: Self-pay | Admitting: Pain Medicine

## 2016-03-16 ENCOUNTER — Ambulatory Visit (INDEPENDENT_AMBULATORY_CARE_PROVIDER_SITE_OTHER): Payer: Medicare Other | Admitting: Obstetrics and Gynecology

## 2016-03-16 ENCOUNTER — Encounter: Payer: Self-pay | Admitting: Obstetrics and Gynecology

## 2016-03-16 VITALS — BP 94/68 | HR 106 | Ht 61.0 in | Wt 140.0 lb

## 2016-03-16 DIAGNOSIS — R87610 Atypical squamous cells of undetermined significance on cytologic smear of cervix (ASC-US): Secondary | ICD-10-CM

## 2016-03-16 DIAGNOSIS — Z90710 Acquired absence of both cervix and uterus: Secondary | ICD-10-CM | POA: Diagnosis not present

## 2016-03-16 DIAGNOSIS — Z9889 Other specified postprocedural states: Secondary | ICD-10-CM | POA: Diagnosis not present

## 2016-03-16 NOTE — Patient Instructions (Signed)
1. Pap smear is performed today 2. If Pap smear is normal, next Pap smear will be due in 2021 3. Return as needed for gynecologic issues

## 2016-03-16 NOTE — Progress Notes (Signed)
Chief complaint: 1. ASCUS Pap smear  History of recurrent ASCUS Pap smear with negative high-risk HPV on 2 separate occasions Patient presents for 1 year follow-up Pap smear. History of ?abdominal hysterectomy 2008 Ascus Pap smear 2             01/29/2015 ascus with NEGATIVE high risk HPV                04/06/2015 ascus with NEGATIVE high risk HPV                Past history, past surgical history, problem list, medications, and allergies are reviewed Review of Systems  Constitutional: Negative.   Gastrointestinal: Positive for constipation.  Genitourinary: Negative.   Skin: Negative.      OBJECTIVE: BP 94/68   Pulse (!) 106   Ht 5\' 1"  (1.549 m)   Wt 140 lb (63.5 kg)   LMP 08/25/2006   BMI 26.45 kg/m   Pleasant female in no acute distress. Alert and oriented Abdomen: Soft nontender; laparoscopy incision in left lower quadrant noted Pelvic exam: External genitalia-normal BUS normal Vagina-normal Cervix-present; no lesions; no cervical motion tenderness Uterus-small or surgically absent (cervix is intact) Adnexa-nonpalpable nontender RV-normal external exam  ASSESSMENT: 1. ASCUS Pap smear 2 2. Status post endometrial ablation 3. History of hysterectomy; cervix is identified on clinical exam; cannot determine if uterus is present (small) or if patient is status post [place]  PLAN: 1. Pap smear is completed 2. Patient is to return in 3 years for repeat Pap smear testing if her, co-test today is negative/negative  Brayton Mars, MD  Note: This dictation was prepared with Dragon dictation along with smaller phrase technology. Any transcriptional errors that result from this process are unintentional.

## 2016-03-18 LAB — PAP IG AND HPV HIGH-RISK
HPV, high-risk: NEGATIVE
PAP Smear Comment: 0

## 2016-08-09 NOTE — Telephone Encounter (Signed)
NA

## 2016-09-09 ENCOUNTER — Other Ambulatory Visit: Payer: Self-pay | Admitting: Nurse Practitioner

## 2016-09-09 DIAGNOSIS — Z1231 Encounter for screening mammogram for malignant neoplasm of breast: Secondary | ICD-10-CM

## 2017-01-02 ENCOUNTER — Ambulatory Visit
Admission: RE | Admit: 2017-01-02 | Discharge: 2017-01-02 | Disposition: A | Discharge status: Home / Self Care | Payer: Medicare Other | Source: Ambulatory Visit | Attending: Nurse Practitioner | Admitting: Nurse Practitioner

## 2017-01-02 DIAGNOSIS — Z1231 Encounter for screening mammogram for malignant neoplasm of breast: Secondary | ICD-10-CM | POA: Insufficient documentation

## 2017-01-04 ENCOUNTER — Other Ambulatory Visit: Payer: Self-pay | Admitting: Nurse Practitioner

## 2017-01-04 DIAGNOSIS — N6489 Other specified disorders of breast: Secondary | ICD-10-CM

## 2017-01-04 DIAGNOSIS — R928 Other abnormal and inconclusive findings on diagnostic imaging of breast: Secondary | ICD-10-CM

## 2017-01-17 ENCOUNTER — Ambulatory Visit
Admission: RE | Admit: 2017-01-17 | Discharge: 2017-01-17 | Disposition: A | Discharge status: Home / Self Care | Payer: Medicare Other | Source: Ambulatory Visit | Attending: Nurse Practitioner | Admitting: Nurse Practitioner

## 2017-01-17 DIAGNOSIS — N6322 Unspecified lump in the left breast, upper inner quadrant: Secondary | ICD-10-CM | POA: Diagnosis not present

## 2017-01-17 DIAGNOSIS — N6489 Other specified disorders of breast: Secondary | ICD-10-CM

## 2017-01-17 DIAGNOSIS — R928 Other abnormal and inconclusive findings on diagnostic imaging of breast: Secondary | ICD-10-CM

## 2017-01-18 ENCOUNTER — Other Ambulatory Visit: Payer: Self-pay | Admitting: Nurse Practitioner

## 2017-01-18 DIAGNOSIS — N6320 Unspecified lump in the left breast, unspecified quadrant: Secondary | ICD-10-CM

## 2017-01-18 DIAGNOSIS — R928 Other abnormal and inconclusive findings on diagnostic imaging of breast: Secondary | ICD-10-CM

## 2017-01-31 DIAGNOSIS — C50919 Malignant neoplasm of unspecified site of unspecified female breast: Secondary | ICD-10-CM

## 2017-01-31 HISTORY — DX: Malignant neoplasm of unspecified site of unspecified female breast: C50.919

## 2017-02-02 ENCOUNTER — Ambulatory Visit
Admission: RE | Admit: 2017-02-02 | Discharge: 2017-02-02 | Disposition: A | Discharge status: Home / Self Care | Payer: Medicare Other | Source: Ambulatory Visit | Attending: Nurse Practitioner | Admitting: Nurse Practitioner

## 2017-02-02 DIAGNOSIS — N6320 Unspecified lump in the left breast, unspecified quadrant: Secondary | ICD-10-CM | POA: Diagnosis present

## 2017-02-02 DIAGNOSIS — R928 Other abnormal and inconclusive findings on diagnostic imaging of breast: Secondary | ICD-10-CM

## 2017-02-02 DIAGNOSIS — C50412 Malignant neoplasm of upper-outer quadrant of left female breast: Secondary | ICD-10-CM | POA: Diagnosis not present

## 2017-02-02 HISTORY — PX: BREAST BIOPSY: SHX20

## 2017-02-03 ENCOUNTER — Telehealth: Payer: Self-pay | Admitting: *Deleted

## 2017-02-03 ENCOUNTER — Other Ambulatory Visit: Payer: Self-pay | Admitting: Pathology

## 2017-02-03 NOTE — Telephone Encounter (Signed)
  Oncology Nurse Navigator Documentation  Navigator Location: (P) [facility] (02/03/17 1400)   )Navigator Encounter Type: (P) Introductory phone call (02/03/17 1400)   Abnormal Finding Date: (P) 01/17/17 (02/03/17 1400) Confirmed Diagnosis Date: (P) 02/03/17 (02/03/17 1400)                   Barriers/Navigation Needs: (P) Education;Coordination of Care (02/03/17 1400)      Called patient to establish navigation services.  Left her a message to return my call.

## 2017-02-06 ENCOUNTER — Encounter: Payer: Self-pay | Admitting: *Deleted

## 2017-02-06 ENCOUNTER — Other Ambulatory Visit: Payer: Self-pay | Admitting: *Deleted

## 2017-02-06 DIAGNOSIS — C50919 Malignant neoplasm of unspecified site of unspecified female breast: Secondary | ICD-10-CM

## 2017-02-06 NOTE — Progress Notes (Signed)
  Oncology Nurse Navigator Documentation  Navigator Location: [facility] (02/06/17 1100)   )Navigator Encounter Type: Introductory phone call (02/06/17 1100)                         Barriers/Navigation Needs: Coordination of Care (02/06/17 1100)   Interventions: Coordination of Care (02/06/17 1100)   Coordination of Care: Appts (02/06/17 1100)                  Time Spent with Patient: 45 (02/06/17 1100)   Called to introduce patient to navigation services.  She is newly diagnosed with invasive left breast cancer.  States she wants to stay here to get her care.  She has chose [facility] for surgical consultation which is scheduled for 02/10/17 @ 11:00 with Dr. Rosana Hoes and Tuesday, 02/07/17 @ 1:30 with Dr. Grayland Ormond.  Will give her educational literature at that time.  She is to call if she has any questions or needs.

## 2017-02-07 ENCOUNTER — Encounter: Payer: Self-pay | Admitting: Oncology

## 2017-02-07 ENCOUNTER — Inpatient Hospital Stay: Payer: Medicare Other | Attending: Oncology | Admitting: Oncology

## 2017-02-07 ENCOUNTER — Other Ambulatory Visit: Payer: Self-pay

## 2017-02-07 ENCOUNTER — Encounter: Payer: Self-pay | Admitting: *Deleted

## 2017-02-07 DIAGNOSIS — F17210 Nicotine dependence, cigarettes, uncomplicated: Secondary | ICD-10-CM | POA: Diagnosis not present

## 2017-02-07 DIAGNOSIS — Z803 Family history of malignant neoplasm of breast: Secondary | ICD-10-CM | POA: Diagnosis not present

## 2017-02-07 DIAGNOSIS — M5136 Other intervertebral disc degeneration, lumbar region: Secondary | ICD-10-CM | POA: Insufficient documentation

## 2017-02-07 DIAGNOSIS — N189 Chronic kidney disease, unspecified: Secondary | ICD-10-CM | POA: Diagnosis not present

## 2017-02-07 DIAGNOSIS — Z79899 Other long term (current) drug therapy: Secondary | ICD-10-CM | POA: Insufficient documentation

## 2017-02-07 DIAGNOSIS — C50412 Malignant neoplasm of upper-outer quadrant of left female breast: Secondary | ICD-10-CM | POA: Insufficient documentation

## 2017-02-07 DIAGNOSIS — J449 Chronic obstructive pulmonary disease, unspecified: Secondary | ICD-10-CM | POA: Insufficient documentation

## 2017-02-07 DIAGNOSIS — G2581 Restless legs syndrome: Secondary | ICD-10-CM | POA: Diagnosis not present

## 2017-02-07 DIAGNOSIS — K219 Gastro-esophageal reflux disease without esophagitis: Secondary | ICD-10-CM | POA: Insufficient documentation

## 2017-02-07 DIAGNOSIS — Z170 Estrogen receptor positive status [ER+]: Secondary | ICD-10-CM | POA: Insufficient documentation

## 2017-02-07 DIAGNOSIS — F419 Anxiety disorder, unspecified: Secondary | ICD-10-CM | POA: Insufficient documentation

## 2017-02-07 DIAGNOSIS — M75100 Unspecified rotator cuff tear or rupture of unspecified shoulder, not specified as traumatic: Secondary | ICD-10-CM | POA: Insufficient documentation

## 2017-02-07 DIAGNOSIS — E0590 Thyrotoxicosis, unspecified without thyrotoxic crisis or storm: Secondary | ICD-10-CM | POA: Diagnosis not present

## 2017-02-07 DIAGNOSIS — G4730 Sleep apnea, unspecified: Secondary | ICD-10-CM | POA: Diagnosis not present

## 2017-02-07 DIAGNOSIS — M797 Fibromyalgia: Secondary | ICD-10-CM | POA: Diagnosis not present

## 2017-02-07 DIAGNOSIS — M25519 Pain in unspecified shoulder: Secondary | ICD-10-CM | POA: Insufficient documentation

## 2017-02-07 DIAGNOSIS — K589 Irritable bowel syndrome without diarrhea: Secondary | ICD-10-CM | POA: Insufficient documentation

## 2017-02-07 DIAGNOSIS — F329 Major depressive disorder, single episode, unspecified: Secondary | ICD-10-CM | POA: Insufficient documentation

## 2017-02-07 NOTE — Progress Notes (Signed)
Patient here today for initial evaluation regarding breast cancer.  

## 2017-02-07 NOTE — Progress Notes (Signed)
  Oncology Nurse Navigator Documentation  Navigator Location: [facility] (02/07/17 1600)   )Navigator Encounter Type: Clinic/MDC (02/07/17 1600)                       Treatment Phase: Pre-Tx/Tx Discussion (02/07/17 1600) Barriers/Navigation Needs: Education (02/07/17 1600) Education: Understanding Cancer/ Treatment Options;Newly Diagnosed Cancer Education (02/07/17 1600) Interventions: Education (02/07/17 1600)     Education Method: Verbal;Written (02/07/17 1600)  Support Groups/Services: Breast Support Group (02/07/17 1600)             Time Spent with Patient: 75 (02/07/17 1600)   Met patient during her initial medical oncology visit with Dr. Grayland Ormond.  She is [place].  States she does not have any support.  Lives alone.  Her son died at age 72.  States she has one brother here, who she does not see very often and one sister in [place].  Gave info on our support group and survivorship program and events.  Gave patient breast cancer educational literature, "My Breast Cancer Treatment Handbook" by Josephine Igo, RN.  She is to call if she has any questions or needs.  She is to let Webb Silversmith or myself know when her surgery date is and we can schedule her to return to see Dr. Grayland Ormond 2 weeks after surgery.

## 2017-02-09 ENCOUNTER — Ambulatory Visit: Payer: Medicare Other | Admitting: Surgery

## 2017-02-10 ENCOUNTER — Encounter: Payer: Self-pay | Admitting: Surgery

## 2017-02-10 ENCOUNTER — Ambulatory Visit (INDEPENDENT_AMBULATORY_CARE_PROVIDER_SITE_OTHER): Payer: Medicare Other | Admitting: Surgery

## 2017-02-10 VITALS — BP 106/72 | HR 89 | Temp 98.1°F | Ht 61.0 in | Wt 135.8 lb

## 2017-02-10 DIAGNOSIS — C50919 Malignant neoplasm of unspecified site of unspecified female breast: Secondary | ICD-10-CM

## 2017-02-10 DIAGNOSIS — C50412 Malignant neoplasm of upper-outer quadrant of left female breast: Secondary | ICD-10-CM

## 2017-02-10 DIAGNOSIS — Z170 Estrogen receptor positive status [ER+]: Secondary | ICD-10-CM | POA: Diagnosis not present

## 2017-02-10 LAB — SURGICAL PATHOLOGY

## 2017-02-10 NOTE — Patient Instructions (Addendum)
We have scheduled your surgery on 02/17/17 at [facility] with Dr.Davis. Please see your blue pre-care sheet for surgery information. Please call our office if you have questions or concerns.      Lumpectomy, Care After This sheet gives you information about how to care for yourself after your procedure. Your health care provider may also give you more specific instructions. If you have problems or questions, contact your health care provider. What can I expect after the procedure? After the procedure, it is common to have:  Breast swelling.  Breast tenderness.  Stiffness in your arm or shoulder.  A change in the shape and feel of your breast.  Scar tissue that feels hard to the touch in the area where the lump was removed.  Follow these instructions at home: Bathing  Take sponge baths until your health care provider says that you can start showering or bathing.  Do not take baths, swim, or use a hot tub until your health care provider approves. Incision care  Follow instructions from your health care provider about how to take care of your incision. Make sure you: ? Wash your hands with soap and water before you change your bandage (dressing). If soap and water are not available, use hand sanitizer. ? Change your dressing as told by your health care provider. ? Leave stitches (sutures), skin glue, or adhesive strips in place. These skin closures may need to stay in place for 2 weeks or longer. If adhesive strip edges start to loosen and curl up, you may trim the loose edges. Do not remove adhesive strips completely unless your health care provider tells you to do that.  Check your incision area every day for signs of infection. Check for: ? More redness, swelling, or pain. ? More fluid or blood. ? Warmth. ? Pus or a bad smell.  Keep your dressing clean and dry.  If you were sent home with a surgical drain in place, follow instructions from your health care provider  about emptying it. Activity  Return to your normal activities as told by your health care provider. Ask your health care provider what activities are safe for you.  Avoid activities that require a lot of energy (are strenuous).  Be careful to avoid any activities that could cause an injury to your arm on the side of your surgery.  Do not lift anything that is heavier than 10 lb (4.5 kg). Avoid lifting with the arm that is on the side of your surgery.  Do not carry heavy objects on your shoulder.  After your drain is removed, you should perform exercises to keep your arm from getting stiff and swollen. Talk with your health care provider about which exercises are safe for you. General instructions  Take over-the-counter and prescription medicines only as told by your health care provider.  You may eat what you usually do.  Wear a supportive bra as told by your health care provider.  Raise (elevate) your arm above the level of your heart while you are sitting or lying down.  Do not wear tight jewelry on your arm, wrist, or fingers on the side of your surgery. Follow-up  Keep all follow-up visits as told by your health care provider. This is important.  You may need to be screened for extra fluid around the lymph nodes (lymphedema). Follow instructions from your health care provider about how often you should be checked.  If you had any lymph nodes removed during your procedure, be  sure to tell all of your health care providers. This is important information to share before you are involved in certain procedures, such as giving blood or having your blood pressure taken. Contact a health care provider if:  You develop a rash.  You have a fever.  Your pain medicine is not working.  Your swelling, weakness, or numbness in your arm has not improved after a few weeks.  You have new swelling in your breast or arm.  You have more redness, swelling, or pain in your incision  area.  You have more fluid or blood coming from your incision.  Your incision feels warm to the touch.  You have pus or a bad smell coming from your incision. Get help right away if:  You have very bad pain in your breast or arm.  You have chest pain.  You have difficulty breathing. This information is not intended to replace advice given to you by your health care provider. Make sure you discuss any questions you have with your health care provider. Document Released: 02/02/2006 Document Revised: 09/30/2015 Document Reviewed: 09/30/2015 Elsevier Interactive Patient Education  2018 [employer].

## 2017-02-11 ENCOUNTER — Encounter: Payer: Self-pay | Admitting: Surgery

## 2017-02-11 NOTE — Progress Notes (Signed)
Surgical Clinic History and Physical  Referring provider:  [name], MD [place], [place] [address]  HISTORY OF PRESENT ILLNESS (HPI):  52 y.o. female presents for evaluation of invasive Left breast cancer. Patient reports she first noticed a small non-tender Left upper outer breast mass while performing a self-exam. Subsequent screening mammogram suggested Left breast asymmetry, and follow-up diagnostic mammogram and ultrasound were concerning for malignancy, which was confirmed on core needle biopsy. Patient expresses concern about the side effects of chemotherapy (particularly hair loss), but says she is committed to treatment. She reports appetite and bowel function WNL, denies any fever/chills, weight loss, N/V, CP, or SOB.  Of note, patient volunteers she wants to do whatever she can to treat her breast cancer and avoid any other forms of cancer, since she says her mother died of glioblastoma multiforme and may have also had breast cancer, while several other members of her family had GI malignancies, but she in the next sentence says smoking is her "only vice", which she is not interested in stopping.  PAST MEDICAL HISTORY (PMH):  Past Medical History:  Diagnosis Date  . Abnormal Pap smear of cervix    01/2015 ascus/neg- 04/2015 ascus/neg  . Allergy   . Anxiety   . Arthritis   . Asthma   . Chronic kidney disease   . COPD (chronic obstructive pulmonary disease) ([place])   . DDD (degenerative disc disease), lumbar   . Depression   . Fibromyalgia   . GERD (gastroesophageal reflux disease)   . History of IBS   . Hyperthyroidism   . Joint disease   . Medical history non-contributory   . Migraine   . Migraine   . Oxygen deficiency   . Restless leg syndrome   . Sleep apnea    Does not use C-PAP, cannot tolerate mask     PAST SURGICAL HISTORY ([place]):  Past Surgical History:  Procedure Laterality Date  . ABDOMINAL HYSTERECTOMY  2008  . BREAST BIOPSY Left  02/02/2017   [place] core path pending  . CARPAL TUNNEL RELEASE Bilateral   . cryotherapy    . DILATION AND CURETTAGE OF UTERUS    . ENDOMETRIAL ABLATION    . LAPAROSCOPIC OOPHERECTOMY Left    unsure which side but thinks its the left  . LYSIS OF ADHESION Left 10/14/2015   Procedure: LYSIS OF ADHESION;  Surgeon: Thornton Park, MD;  Location: [facility] ORS;  Service: Orthopedics;  Laterality: Left;  . NECK SURGERY     lower neck fusion rods and screws  . RESECTION DISTAL CLAVICAL Left 10/14/2015   Procedure: RESECTION DISTAL CLAVICAL;  Surgeon: Thornton Park, MD;  Location: [facility] ORS;  Service: Orthopedics;  Laterality: Left;  . SHOULDER ARTHROSCOPY WITH OPEN ROTATOR CUFF REPAIR Left 10/14/2015   Procedure: SHOULDER ARTHROSCOPY WITH OPEN ROTATOR CUFF REPAIR;  Surgeon: Thornton Park, MD;  Location: [facility] ORS;  Service: Orthopedics;  Laterality: Left;  . SHOULDER ARTHROSCOPY WITH OPEN ROTATOR CUFF REPAIR AND DISTAL CLAVICLE ACROMINECTOMY Right 09/03/2014   Procedure: RIGHT SHOULDER ARTHROSCOPY WITH MINI OPEN ROTATOR CUFF TEAR;  Surgeon: Thornton Park, MD;  Location: [facility] ORS;  Service: Orthopedics;  Laterality: Right;  biceps tenodesis, arthroscopic subacromial decompression and distal clavicle incision  . spinal injections    . SUBACROMIAL DECOMPRESSION Left 10/14/2015   Procedure: SUBACROMIAL DECOMPRESSION;  Surgeon: Thornton Park, MD;  Location: [facility] ORS;  Service: Orthopedics;  Laterality: Left;     MEDICATIONS:  Prior to Admission medications   Medication Sig Start Date  End Date Taking? Authorizing Provider  albuterol (ACCUNEB) 0.63 MG/3ML nebulizer solution Take 1 ampule by nebulization every 6 (six) hours as needed for wheezing.   Yes [provider]  baclofen (LIORESAL) 10 MG tablet Take by mouth.   Yes [provider]  cholecalciferol (VITAMIN D) 1000 UNITS tablet Take 2,000 Units by mouth daily.   Yes [provider]  diclofenac sodium (VOLTAREN) 1 % GEL Apply 2  g topically 4 (four) times daily.   Yes [provider]  EPINEPHrine (EPIPEN JR) 0.15 MG/0.3ML injection Inject 0.15 mg into the muscle as needed for anaphylaxis.   Yes [provider]  estrogen, conjugated,-medroxyprogesterone (PREMPRO) 0.3-1.5 MG per tablet Take 1 tablet by mouth daily.   Yes [provider]  FLUoxetine (PROZAC) 40 MG capsule fluoxetine 40 mg capsule   Yes [provider]  hydrocortisone 2.5 % cream hydrocortisone 2.5 % topical cream   Yes [provider]  LINZESS 290 MCG CAPS capsule Take 290 mcg by mouth as needed.  07/20/15  Yes [provider]  LORazepam (ATIVAN) 0.5 MG tablet Take 0.5 mg by mouth every 8 (eight) hours.   Yes [provider]  magnesium oxide (MAG-OX) 400 MG tablet Take 400 mg by mouth daily. Reported on 07/08/2015   Yes [provider]  Multiple Vitamins-Minerals (HAIR SKIN AND NAILS FORMULA) TABS Take 2 tablets by mouth daily.    Yes [provider]  omeprazole (PRILOSEC) 20 MG capsule Take 20 mg by mouth daily.   Yes [provider]  oxyCODONE 10 MG TABS Take 1 tablet (10 mg total) by mouth every 4 (four) hours as needed for breakthrough pain. 10/15/15  Yes Thornton Park, MD  promethazine (PHENERGAN) 25 MG tablet Take 25 mg by mouth every 6 (six) hours as needed for nausea or vomiting.   Yes [provider]  rosuvastatin (CRESTOR) 40 MG tablet Take 40 mg by mouth daily.  08/07/15  Yes [provider]  topiramate (TOPAMAX) 100 MG tablet Take 100 mg by mouth 2 (two) times daily.   Yes [provider]  vitamin E (VITAMIN E) 200 UNIT capsule Take 200 Units by mouth daily.   Yes [provider]     ALLERGIES:  Allergies  Allergen Reactions  . Bee Venom   . Cefoxitin   . Gadolinium Derivatives Swelling and Other (See Comments)    NECK BECAME RED AND TONGUE WAS SWELLING SLIGHTLY PER PT NECK BECAME RED AND TONGUE WAS SWELLING SLIGHTLY PER  PT  . Iodine   . Cefuroxime Axetil Hives and Rash  . Cucumber Extract Rash  . Iodinated Diagnostic Agents Hives and Rash  . Latex Rash  . Other Hives and Rash    Bee Stings Bee Stings Bee Stings  . Tomato Rash    Red tomatoes     SOCIAL HISTORY: Self-identifies as [place] Social History   Socioeconomic History  . Marital status: Divorced    Spouse name: Not on file  . Number of children: Not on file  . Years of education: Not on file  . Highest education level: Not on file  Social Needs  . Financial resource strain: Not on file  . Food insecurity - worry: Not on file  . Food insecurity - inability: Not on file  . Transportation needs - medical: Not on file  . Transportation needs - non-medical: Not on file  Occupational History  . Not on file  Tobacco Use  . Smoking status: Current  Every Day Smoker    Packs/day: 1.00    Types: Cigarettes  . Smokeless tobacco: Never Used  Substance and Sexual Activity  . Alcohol use: No    Alcohol/week: 0.0 oz  . Drug use: No  . Sexual activity: Not Currently  Other Topics Concern  . Not on file  Social History Narrative  . Not on file    The patient currently resides (home / rehab facility / nursing home): Home The patient normally is (ambulatory / bedbound): Ambulatory  FAMILY HISTORY:  Family History  Problem Relation Age of Onset  . Cancer Mother   . Breast cancer Mother   . Cancer Father   . Diabetes Father   . Ovarian cancer Neg Hx     Otherwise negative/non-contributory.  REVIEW OF SYSTEMS:  Constitutional: denies any other weight loss, fever, chills, or sweats  Eyes: denies any other vision changes, history of eye injury  ENT: denies sore throat, hearing problems  Respiratory: denies shortness of breath, wheezing  Cardiovascular: denies chest pain, palpitations Breasts: pain and Left breast mass as per HPI  Gastrointestinal: denies abdominal pain, N/V, and bowel function as per HPI Musculoskeletal:  denies any other joint pains or cramps  Skin: Denies any other rashes or skin discolorations Neurological: denies any other headache, dizziness, weakness  Psychiatric: Expresses anxiety, denies any other depression  All other review of systems were otherwise negative   VITAL SIGNS:  BP 106/72   Pulse 89   Temp 98.1 F (36.7 C) (Oral)   Ht '5\' 1"'  (1.549 m)   Wt 135 lb 12.8 oz (61.6 kg)   LMP 08/25/2006   BMI 25.66 kg/m   PHYSICAL EXAM:  Constitutional:  -- Normal body habitus  -- Awake, alert, and oriented x3  Eyes:  -- Pupils equally round and reactive to light  -- No scleral icterus  Ear, nose, throat:  -- No jugular venous distension -- No nasal drainage, bleeding Pulmonary:  -- No crackles  -- Equal breath sounds bilaterally -- Breathing non-labored at rest Cardiovascular:  -- S1, S2 present  -- No pericardial rubs  Breasts: -- Non-tender subtly palpable Left ~1 o'clock upper outer quadrant mass among non-dense breast tissue -- No nipple discharge or other palpable masses and no palpable axillary lymphadenopathy bilaterally Gastrointestinal:  -- Abdomen soft, nontender, non-distended, no guarding/rebound  -- No abdominal masses appreciated, pulsatile or otherwise  Musculoskeletal and Integumentary:  -- Wounds or skin discoloration: None appreciated -- Extremities: B/L UE and LE FROM, hands and feet warm, no edema  Neurologic:  -- Motor function: Intact and symmetric -- Sensation: Intact and symmetric  Labs:  CBC:  Lab Results  Component Value Date   WBC 19.7 (H) 10/15/2015   RBC 3.57 (L) 10/15/2015   BMP:  Lab Results  Component Value Date   GLUCOSE 114 (H) 10/15/2015   CO2 21 (L) 10/15/2015   BUN 16 10/15/2015   CREATININE 1.00 10/15/2015   CREATININE 1.01 03/14/2012   CALCIUM 8.5 (L) 10/15/2015     Imaging studies:  Left Breast Diagnostic Mammogram and Focal Ultrasound (01/17/2017) Spot compression and true lateral tomographic images  demonstrate focal distortion over the upper outer quadrant of the left breast over the middle third. Remainder of the exam is unchanged.  Targeted ultrasound is performed, showing an irregular-shaped hypoechoic mass with indistinct borders at the 1 o'clock position of the left breast 5-6 cm from the nipple with mild distal shadowing measuring 0.9 x 0.9 x 1.5 cm. This is  concerning for invasive mammary carcinoma lobular type.   Pathology: Clinical stage Ia ER+/PR+, HER-2 over-expressing invasive carcinoma of Left breast upper outer quadrant  Assessment/Plan:  52 y.o. female with ~0.9 x 0.9 x 1.5 cm invasive Left breast carcinoma, complicated by co-morbidities including HTN, hypercholesterolemia, COPD, OSA (doesn't use prescribed CPAP), ?CKD, hyperthyroidism, GERD, migraine headaches, lumbar degenerative disc disease, osteoarthritis, irritable bowel syndrome, fibromyalgia, major depression disorder, generalized anxiety disorder, and restless legs syndrome.   - patient expresses preference for breast-conserving lumpectomy + radiation  - all risks, benefits, and alternatives to Left breast lumpectomy with wire-localization and sentinel lymph node(s) biopsy (including possible need for further resection or procedures, cosmetic assymetry) were discussed with the patient, all of her questions were answered to her expressed satisfaction, patient expresses she wishes to proceed, and informed consent was obtained.  - will plan for Left breast-conserving lumpectomy with wire-localization and sentinel lymph node biopsy next Friday, 1/18  - if patient's brother unable to provide transportation, may require transportation assistance (office to communicate accordingly with oncology to coordinate/arrange)  - return to clinic 2 weeks after above procedure, instructed to call if any questions or concerns  All of the above recommendations were discussed with the patient, and all of patient's questions were  answered to her expressed satisfaction.  Thank you for the opportunity to participate in this patient's care.  -- Marilynne Drivers Rosana Hoes, MD, [place]: [facility] - Partnering for exceptional care. Office: [phone]

## 2017-02-11 NOTE — H&P (View-Only) (Signed)
Surgical Clinic History and Physical  Referring provider:  [name], MD [place], [place] [address]  HISTORY OF PRESENT ILLNESS (HPI):  52 y.o. female presents for evaluation of invasive Left breast cancer. Patient reports she first noticed a small non-tender Left upper outer breast mass while performing a self-exam. Subsequent screening mammogram suggested Left breast asymmetry, and follow-up diagnostic mammogram and ultrasound were concerning for malignancy, which was confirmed on core needle biopsy. Patient expresses concern about the side effects of chemotherapy (particularly hair loss), but says she is committed to treatment. She reports appetite and bowel function WNL, denies any fever/chills, weight loss, N/V, CP, or SOB.  Of note, patient volunteers she wants to do whatever she can to treat her breast cancer and avoid any other forms of cancer, since she says her mother died of glioblastoma multiforme and may have also had breast cancer, while several other members of her family had GI malignancies, but she in the next sentence says smoking is her "only vice", which she is not interested in stopping.  PAST MEDICAL HISTORY (PMH):  Past Medical History:  Diagnosis Date  . Abnormal Pap smear of cervix    01/2015 ascus/neg- 04/2015 ascus/neg  . Allergy   . Anxiety   . Arthritis   . Asthma   . Chronic kidney disease   . COPD (chronic obstructive pulmonary disease) ([place])   . DDD (degenerative disc disease), lumbar   . Depression   . Fibromyalgia   . GERD (gastroesophageal reflux disease)   . History of IBS   . Hyperthyroidism   . Joint disease   . Medical history non-contributory   . Migraine   . Migraine   . Oxygen deficiency   . Restless leg syndrome   . Sleep apnea    Does not use C-PAP, cannot tolerate mask     PAST SURGICAL HISTORY ([place]):  Past Surgical History:  Procedure Laterality Date  . ABDOMINAL HYSTERECTOMY  2008  . BREAST BIOPSY Left  02/02/2017   [place] core path pending  . CARPAL TUNNEL RELEASE Bilateral   . cryotherapy    . DILATION AND CURETTAGE OF UTERUS    . ENDOMETRIAL ABLATION    . LAPAROSCOPIC OOPHERECTOMY Left    unsure which side but thinks its the left  . LYSIS OF ADHESION Left 10/14/2015   Procedure: LYSIS OF ADHESION;  Surgeon: Thornton Park, MD;  Location: [facility] ORS;  Service: Orthopedics;  Laterality: Left;  . NECK SURGERY     lower neck fusion rods and screws  . RESECTION DISTAL CLAVICAL Left 10/14/2015   Procedure: RESECTION DISTAL CLAVICAL;  Surgeon: Thornton Park, MD;  Location: [facility];  Service: Orthopedics;  Laterality: Left;  . SHOULDER ARTHROSCOPY WITH OPEN ROTATOR CUFF REPAIR Left 10/14/2015   Procedure: SHOULDER ARTHROSCOPY WITH OPEN ROTATOR CUFF REPAIR;  Surgeon: Thornton Park, MD;  Location: [facility];  Service: Orthopedics;  Laterality: Left;  . SHOULDER ARTHROSCOPY WITH OPEN ROTATOR CUFF REPAIR AND DISTAL CLAVICLE ACROMINECTOMY Right 09/03/2014   Procedure: RIGHT SHOULDER ARTHROSCOPY WITH MINI OPEN ROTATOR CUFF TEAR;  Surgeon: Thornton Park, MD;  Location: [facility];  Service: Orthopedics;  Laterality: Right;  biceps tenodesis, arthroscopic subacromial decompression and distal clavicle incision  . spinal injections    . SUBACROMIAL DECOMPRESSION Left 10/14/2015   Procedure: SUBACROMIAL DECOMPRESSION;  Surgeon: Thornton Park, MD;  Location: [facility];  Service: Orthopedics;  Laterality: Left;     MEDICATIONS:  Prior to Admission medications   Medication Sig Start Date  End Date Taking? Authorizing Provider  albuterol (ACCUNEB) 0.63 MG/3ML nebulizer solution Take 1 ampule by nebulization every 6 (six) hours as needed for wheezing.   Yes [provider]  baclofen (LIORESAL) 10 MG tablet Take by mouth.   Yes [provider]  cholecalciferol (VITAMIN D) 1000 UNITS tablet Take 2,000 Units by mouth daily.   Yes [provider]  diclofenac sodium (VOLTAREN) 1 % GEL Apply 2  g topically 4 (four) times daily.   Yes [provider]  EPINEPHrine (EPIPEN JR) 0.15 MG/0.3ML injection Inject 0.15 mg into the muscle as needed for anaphylaxis.   Yes [provider]  estrogen, conjugated,-medroxyprogesterone (PREMPRO) 0.3-1.5 MG per tablet Take 1 tablet by mouth daily.   Yes [provider]  FLUoxetine (PROZAC) 40 MG capsule fluoxetine 40 mg capsule   Yes [provider]  hydrocortisone 2.5 % cream hydrocortisone 2.5 % topical cream   Yes [provider]  LINZESS 290 MCG CAPS capsule Take 290 mcg by mouth as needed.  07/20/15  Yes [provider]  LORazepam (ATIVAN) 0.5 MG tablet Take 0.5 mg by mouth every 8 (eight) hours.   Yes [provider]  magnesium oxide (MAG-OX) 400 MG tablet Take 400 mg by mouth daily. Reported on 07/08/2015   Yes [provider]  Multiple Vitamins-Minerals (HAIR SKIN AND NAILS FORMULA) TABS Take 2 tablets by mouth daily.    Yes [provider]  omeprazole (PRILOSEC) 20 MG capsule Take 20 mg by mouth daily.   Yes [provider]  oxyCODONE 10 MG TABS Take 1 tablet (10 mg total) by mouth every 4 (four) hours as needed for breakthrough pain. 10/15/15  Yes Thornton Park, MD  promethazine (PHENERGAN) 25 MG tablet Take 25 mg by mouth every 6 (six) hours as needed for nausea or vomiting.   Yes [provider]  rosuvastatin (CRESTOR) 40 MG tablet Take 40 mg by mouth daily.  08/07/15  Yes [provider]  topiramate (TOPAMAX) 100 MG tablet Take 100 mg by mouth 2 (two) times daily.   Yes [provider]  vitamin E (VITAMIN E) 200 UNIT capsule Take 200 Units by mouth daily.   Yes [provider]     ALLERGIES:  Allergies  Allergen Reactions  . Bee Venom   . Cefoxitin   . Gadolinium Derivatives Swelling and Other (See Comments)    NECK BECAME RED AND TONGUE WAS SWELLING SLIGHTLY PER PT NECK BECAME RED AND TONGUE WAS SWELLING SLIGHTLY PER  PT  . Iodine   . Cefuroxime Axetil Hives and Rash  . Cucumber Extract Rash  . Iodinated Diagnostic Agents Hives and Rash  . Latex Rash  . Other Hives and Rash    Bee Stings Bee Stings Bee Stings  . Tomato Rash    Red tomatoes     SOCIAL HISTORY: Self-identifies as Native American Social History   Socioeconomic History  . Marital status: Divorced    Spouse name: Not on file  . Number of children: Not on file  . Years of education: Not on file  . Highest education level: Not on file  Social Needs  . Financial resource strain: Not on file  . Food insecurity - worry: Not on file  . Food insecurity - inability: Not on file  . Transportation needs - medical: Not on file  . Transportation needs - non-medical: Not on file  Occupational History  . Not on file  Tobacco Use  . Smoking status: Current  Every Day Smoker    Packs/day: 1.00    Types: Cigarettes  . Smokeless tobacco: Never Used  Substance and Sexual Activity  . Alcohol use: No    Alcohol/week: 0.0 oz  . Drug use: No  . Sexual activity: Not Currently  Other Topics Concern  . Not on file  Social History Narrative  . Not on file    The patient currently resides (home / rehab facility / nursing home): Home The patient normally is (ambulatory / bedbound): Ambulatory  FAMILY HISTORY:  Family History  Problem Relation Age of Onset  . Cancer Mother   . Breast cancer Mother   . Cancer Father   . Diabetes Father   . Ovarian cancer Neg Hx     Otherwise negative/non-contributory.  REVIEW OF SYSTEMS:  Constitutional: denies any other weight loss, fever, chills, or sweats  Eyes: denies any other vision changes, history of eye injury  ENT: denies sore throat, hearing problems  Respiratory: denies shortness of breath, wheezing  Cardiovascular: denies chest pain, palpitations Breasts: pain and Left breast mass as per HPI  Gastrointestinal: denies abdominal pain, N/V, and bowel function as per HPI Musculoskeletal:  denies any other joint pains or cramps  Skin: Denies any other rashes or skin discolorations Neurological: denies any other headache, dizziness, weakness  Psychiatric: Expresses anxiety, denies any other depression  All other review of systems were otherwise negative   VITAL SIGNS:  BP 106/72   Pulse 89   Temp 98.1 F (36.7 C) (Oral)   Ht '5\' 1"'  (1.549 m)   Wt 135 lb 12.8 oz (61.6 kg)   LMP 08/25/2006   BMI 25.66 kg/m   PHYSICAL EXAM:  Constitutional:  -- Normal body habitus  -- Awake, alert, and oriented x3  Eyes:  -- Pupils equally round and reactive to light  -- No scleral icterus  Ear, nose, throat:  -- No jugular venous distension -- No nasal drainage, bleeding Pulmonary:  -- No crackles  -- Equal breath sounds bilaterally -- Breathing non-labored at rest Cardiovascular:  -- S1, S2 present  -- No pericardial rubs  Breasts: -- Non-tender subtly palpable Left ~1 o'clock upper outer quadrant mass among non-dense breast tissue -- No nipple discharge or other palpable masses and no palpable axillary lymphadenopathy bilaterally Gastrointestinal:  -- Abdomen soft, nontender, non-distended, no guarding/rebound  -- No abdominal masses appreciated, pulsatile or otherwise  Musculoskeletal and Integumentary:  -- Wounds or skin discoloration: None appreciated -- Extremities: B/L UE and LE FROM, hands and feet warm, no edema  Neurologic:  -- Motor function: Intact and symmetric -- Sensation: Intact and symmetric  Labs:  CBC:  Lab Results  Component Value Date   WBC 19.7 (H) 10/15/2015   RBC 3.57 (L) 10/15/2015   BMP:  Lab Results  Component Value Date   GLUCOSE 114 (H) 10/15/2015   CO2 21 (L) 10/15/2015   BUN 16 10/15/2015   CREATININE 1.00 10/15/2015   CREATININE 1.01 03/14/2012   CALCIUM 8.5 (L) 10/15/2015     Imaging studies:  Left Breast Diagnostic Mammogram and Focal Ultrasound (01/17/2017) Spot compression and true lateral tomographic images  demonstrate focal distortion over the upper outer quadrant of the left breast over the middle third. Remainder of the exam is unchanged.  Targeted ultrasound is performed, showing an irregular-shaped hypoechoic mass with indistinct borders at the 1 o'clock position of the left breast 5-6 cm from the nipple with mild distal shadowing measuring 0.9 x 0.9 x 1.5 cm. This is  concerning for invasive mammary carcinoma lobular type.   Pathology: Clinical stage Ia ER+/PR+, HER-2 over-expressing invasive carcinoma of Left breast upper outer quadrant  Assessment/Plan:  52 y.o. female with ~0.9 x 0.9 x 1.5 cm invasive Left breast carcinoma, complicated by co-morbidities including HTN, hypercholesterolemia, COPD, OSA (doesn't use prescribed CPAP), ?CKD, hyperthyroidism, GERD, migraine headaches, lumbar degenerative disc disease, osteoarthritis, irritable bowel syndrome, fibromyalgia, major depression disorder, generalized anxiety disorder, and restless legs syndrome.   - patient expresses preference for breast-conserving lumpectomy + radiation  - all risks, benefits, and alternatives to Left breast lumpectomy with wire-localization and sentinel lymph node(s) biopsy (including possible need for further resection or procedures, cosmetic assymetry) were discussed with the patient, all of her questions were answered to her expressed satisfaction, patient expresses she wishes to proceed, and informed consent was obtained.  - will plan for Left breast-conserving lumpectomy with wire-localization and sentinel lymph node biopsy next Friday, 1/18  - if patient's brother unable to provide transportation, may require transportation assistance (office to communicate accordingly with oncology to coordinate/arrange)  - return to clinic 2 weeks after above procedure, instructed to call if any questions or concerns  All of the above recommendations were discussed with the patient, and all of patient's questions were  answered to her expressed satisfaction.  Thank you for the opportunity to participate in this patient's care.  -- Marilynne Drivers Rosana Hoes, MD, [place]: [facility] - Partnering for exceptional care. Office: [phone]

## 2017-02-11 NOTE — Progress Notes (Signed)
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  INTERVAL HISTORY: Patient is a 52 year old female who noted an small mass in her left breast.  Subsequent mammogram, ultrasound, and biopsy revealed the above-stated breast cancer.  She is anxious, but otherwise feels well.  She has no neurologic complaints.  She denies any recent fevers or illnesses.  She has a good appetite and denies weight loss.  She has no chest pain or shortness of breath.  She denies any nausea, vomiting, constipation, or diarrhea.  She has no urinary complaints.  Patient otherwise feels well and offers no further specific complaints.  REVIEW OF SYSTEMS:   Review of Systems  Constitutional: Negative.  Negative for fever, malaise/fatigue and weight loss.  Respiratory: Negative.  Negative for cough and shortness of breath.   Cardiovascular: Negative.  Negative for chest pain and leg swelling.  Gastrointestinal: Negative.  Negative for abdominal pain.  Genitourinary: Negative.   Musculoskeletal: Negative.   Skin: Negative for rash.  Neurological: Negative.  Negative for sensory change and weakness.  Psychiatric/Behavioral: The patient is nervous/anxious.     As per HPI. Otherwise, a complete review of systems is negative.  PAST MEDICAL HISTORY: Past Medical History:  Diagnosis Date  . Abnormal Pap smear of cervix    01/2015 ascus/neg- 04/2015 ascus/neg  . Allergy   . Anxiety   . Arthritis   . Asthma   . Chronic kidney disease   . COPD (chronic obstructive pulmonary disease) ([place])   . DDD (degenerative disc disease), lumbar   . Depression   . Fibromyalgia   . GERD (gastroesophageal reflux disease)   .  History of IBS   . Hyperthyroidism   . Joint disease   . Medical history non-contributory   . Migraine   . Migraine   . Oxygen deficiency   . Restless leg syndrome   . Sleep apnea    Does not use C-PAP, cannot tolerate mask    PAST SURGICAL HISTORY: Past Surgical History:  Procedure Laterality Date  . ABDOMINAL HYSTERECTOMY  2008  . BREAST BIOPSY Left 02/02/2017   [place] core path pending  . CARPAL TUNNEL RELEASE Bilateral   . cryotherapy    . DILATION AND CURETTAGE OF UTERUS    . ENDOMETRIAL ABLATION    . LAPAROSCOPIC OOPHERECTOMY Left    unsure which side but thinks its the left  . LYSIS OF ADHESION Left 10/14/2015   Procedure: LYSIS OF ADHESION;  Surgeon: Thornton Park, MD;  Location: [facility] ORS;  Service: Orthopedics;  Laterality: Left;  . NECK SURGERY     lower neck fusion rods and screws  . RESECTION DISTAL CLAVICAL Left 10/14/2015   Procedure: RESECTION DISTAL CLAVICAL;  Surgeon: Thornton Park, MD;  Location: [facility] ORS;  Service: Orthopedics;  Laterality: Left;  . SHOULDER ARTHROSCOPY WITH OPEN ROTATOR CUFF REPAIR Left 10/14/2015   Procedure: SHOULDER ARTHROSCOPY WITH OPEN ROTATOR CUFF REPAIR;  Surgeon: Thornton Park, MD;  Location: [facility] ORS;  Service: Orthopedics;  Laterality: Left;  . SHOULDER ARTHROSCOPY WITH OPEN ROTATOR CUFF REPAIR AND DISTAL CLAVICLE ACROMINECTOMY Right 09/03/2014   Procedure: RIGHT SHOULDER ARTHROSCOPY WITH MINI OPEN ROTATOR CUFF TEAR;  Surgeon: Thornton Park, MD;  Location: [facility] ORS;  Service: Orthopedics;  Laterality: Right;  biceps tenodesis, arthroscopic subacromial decompression and distal clavicle incision  . spinal injections    . SUBACROMIAL DECOMPRESSION Left 10/14/2015   Procedure: SUBACROMIAL DECOMPRESSION;  Surgeon: Thornton Park, MD;  Location: [facility] ORS;  Service: Orthopedics;  Laterality: Left;    FAMILY HISTORY: Family History  Problem Relation Age of Onset  . Cancer Mother   . Breast cancer Mother   . Cancer Father   . Diabetes  Father   . Ovarian cancer Neg Hx     ADVANCED DIRECTIVES (Y/N):  N  HEALTH MAINTENANCE: Social History   Tobacco Use  . Smoking status: Current Every Day Smoker    Packs/day: 1.00    Types: Cigarettes  . Smokeless tobacco: Never Used  Substance Use Topics  . Alcohol use: No    Alcohol/week: 0.0 oz  . Drug use: No     Colonoscopy:  PAP:  Bone density:  Lipid panel:  Allergies  Allergen Reactions  . Bee Venom   . Cefoxitin   . Gadolinium Derivatives Swelling and Other (See Comments)    NECK BECAME RED AND TONGUE WAS SWELLING SLIGHTLY PER PT NECK BECAME RED AND TONGUE WAS SWELLING SLIGHTLY PER PT  . Iodine   . Cefuroxime Axetil Hives and Rash  . Cucumber Extract Rash  . Iodinated Diagnostic Agents Hives and Rash  . Latex Rash  . Other Hives and Rash    Bee Stings Bee Stings Bee Stings  . Tomato Rash    Red tomatoes    Current Outpatient Medications  Medication Sig Dispense Refill  . albuterol (ACCUNEB) 0.63 MG/3ML nebulizer solution Take 1 ampule by nebulization every 6 (six) hours as needed for wheezing.    . baclofen (LIORESAL) 10 MG tablet Take by mouth.    . cholecalciferol (VITAMIN D) 1000 UNITS tablet Take 2,000 Units by mouth daily.    . diclofenac sodium (VOLTAREN) 1 % GEL Apply 2 g topically 4 (four) times daily.    Marland Kitchen EPINEPHrine (EPIPEN JR) 0.15 MG/0.3ML injection Inject 0.15 mg into the muscle as needed for anaphylaxis.    Marland Kitchen estrogen, conjugated,-medroxyprogesterone (PREMPRO) 0.3-1.5 MG per tablet Take 1 tablet by mouth daily.    Marland Kitchen FLUoxetine (PROZAC) 40 MG capsule fluoxetine 40 mg capsule    . hydrocortisone 2.5 % cream hydrocortisone 2.5 % topical cream    . LINZESS 290 MCG CAPS capsule Take 290 mcg by mouth as needed.     Marland Kitchen LORazepam (ATIVAN) 0.5 MG tablet Take 0.5 mg by mouth every 8 (eight) hours.    . magnesium oxide (MAG-OX) 400 MG tablet Take 400 mg by mouth daily. Reported on 07/08/2015    . Multiple Vitamins-Minerals (HAIR SKIN AND NAILS  FORMULA) TABS Take 2 tablets by mouth daily.     Marland Kitchen omeprazole (PRILOSEC) 20 MG capsule Take 20 mg by mouth daily.    Marland Kitchen oxyCODONE 10 MG TABS Take 1 tablet (10 mg total) by mouth every 4 (four) hours as needed for breakthrough pain. 60 tablet 0  . promethazine (PHENERGAN) 25 MG tablet Take 25 mg by mouth every 6 (six) hours as needed for nausea or vomiting.    . rosuvastatin (CRESTOR) 40 MG tablet Take 40 mg by mouth daily.     Marland Kitchen topiramate (TOPAMAX) 100 MG tablet Take 100 mg by mouth 2 (two) times daily.    . vitamin E (VITAMIN E) 200 UNIT capsule Take 200 Units by mouth daily.     No current facility-administered medications for this visit.  OBJECTIVE: Vitals:   02/07/17 1407  BP: 110/69  Pulse: 84  Resp: 18  Temp: (!) 97.1 F (36.2 C)     Body mass index is 25.66 kg/m.    ECOG FS:0 - Asymptomatic  General: Well-developed, well-nourished, no acute distress. Eyes: Pink conjunctiva, anicteric sclera. HEENT: Normocephalic, moist mucous membranes, clear oropharnyx. Breast: Patient requested exam be deferred today. Lungs: Clear to auscultation bilaterally. Heart: Regular rate and rhythm. No rubs, murmurs, or gallops. Abdomen: Soft, nontender, nondistended. No organomegaly noted, normoactive bowel sounds. Musculoskeletal: No edema, cyanosis, or clubbing. Neuro: Alert, answering all questions appropriately. Cranial nerves grossly intact. Skin: No rashes or petechiae noted. Psych: Normal affect. Lymphatics: No cervical, calvicular, axillary or inguinal LAD.   LAB RESULTS:  Lab Results  Component Value Date   NA 137 10/15/2015   K 4.3 10/15/2015   CL 111 10/15/2015   CO2 21 (L) 10/15/2015   GLUCOSE 114 (H) 10/15/2015   BUN 16 10/15/2015   CREATININE 1.00 10/15/2015   CALCIUM 8.5 (L) 10/15/2015   GFRNONAA >60 10/15/2015   GFRAA >60 10/15/2015    Lab Results  Component Value Date   WBC 19.7 (H) 10/15/2015   NEUTROABS 9.3 (H) 10/07/2015   HGB 11.8 (L) 10/15/2015   HCT  34.5 (L) 10/15/2015   MCV 96.4 10/15/2015   PLT 224 10/15/2015     STUDIES: US Breast Ltd Uni Left Inc Axilla  Result Date: 01/17/2017 CLINICAL DATA:  Patient presents for additional views of the left breast as followup to a recent screening exam to evaluate a possible asymmetry. EXAM: 2D DIGITAL DIAGNOSTIC left MAMMOGRAM WITH ADJUNCT TOMO ULTRASOUND left BREAST COMPARISON:  Previous exam(s). ACR Breast Density Category b: There are scattered areas of fibroglandular density. FINDINGS: Spot compression and true lateral tomographic images demonstrate focal distortion over the upper outer quadrant of the left breast over the middle third. Remainder of the exam is unchanged. Targeted ultrasound is performed, showing an irregular-shaped hypoechoic mass with indistinct borders at the 1 o'clock position of the left breast 5-6 cm from the nipple with mild distal shadowing measuring 0.9 x 0.9 x 1.5 cm. This is concerning for invasive mammary carcinoma lobular type. Ultrasound of the left axilla is normal. IMPRESSION: Suspicious mass over the 1 o'clock position of the left breast 5-6 cm from the nipple measuring 0.9 x 0.9 x 1.5 cm. RECOMMENDATION: Recommend ultrasound-guided core needle biopsy for further evaluation of this suspicious abnormality. I have discussed the findings and recommendations with the patient. Results were also provided in writing at the conclusion of the visit. If applicable, a reminder letter will be sent to the patient regarding the next appointment. BI-RADS CATEGORY  5: Highly suggestive of malignancy. An order will be obtained for the biopsy from patient's physician and patient will be contacted by our staff here at the [facility] to schedule the biopsy . Electronically Signed   By: Marin Olp M.D.   On: 01/17/2017 11:42   Mm Diag Breast Tomo Uni Left  Result Date: 01/17/2017 CLINICAL DATA:  Patient presents for additional views of the left breast as followup to a recent  screening exam to evaluate a possible asymmetry. EXAM: 2D DIGITAL DIAGNOSTIC left MAMMOGRAM WITH ADJUNCT TOMO ULTRASOUND left BREAST COMPARISON:  Previous exam(s). ACR Breast Density Category b: There are scattered areas of fibroglandular density. FINDINGS: Spot compression and true lateral tomographic images demonstrate focal distortion over the upper outer quadrant of the left breast over the middle third. Remainder of the  exam is unchanged. Targeted ultrasound is performed, showing an irregular-shaped hypoechoic mass with indistinct borders at the 1 o'clock position of the left breast 5-6 cm from the nipple with mild distal shadowing measuring 0.9 x 0.9 x 1.5 cm. This is concerning for invasive mammary carcinoma lobular type. Ultrasound of the left axilla is normal. IMPRESSION: Suspicious mass over the 1 o'clock position of the left breast 5-6 cm from the nipple measuring 0.9 x 0.9 x 1.5 cm. RECOMMENDATION: Recommend ultrasound-guided core needle biopsy for further evaluation of this suspicious abnormality. I have discussed the findings and recommendations with the patient. Results were also provided in writing at the conclusion of the visit. If applicable, a reminder letter will be sent to the patient regarding the next appointment. BI-RADS CATEGORY  5: Highly suggestive of malignancy. An order will be obtained for the biopsy from patient's physician and patient will be contacted by our staff here at the [facility] to schedule the biopsy . Electronically Signed   By: Marin Olp M.D.   On: 01/17/2017 11:42   Mm Clip Placement Left  Result Date: 02/02/2017 CLINICAL DATA:  Post ultrasound-guided core needle biopsy of left breast 1 o'clock mass. EXAM: DIAGNOSTIC LEFT MAMMOGRAM POST ULTRASOUND BIOPSY COMPARISON:  Previous exam(s). FINDINGS: Mammographic images were obtained following ultra guided biopsy of left breast 1 o'clock mass. Two-view mammography demonstrates presence of ribbon shaped marker  within the biopsy site, in appropriate mammographic position. IMPRESSION: Successful placement of ribbon shaped marker post ultrasound-guided core needle biopsy of left breast 1 o'clock mass. Final Assessment: Post Procedure Mammograms for Marker Placement Electronically Signed   By: Fidela Salisbury M.D.   On: 02/02/2017 13:33   [place] Lt Breast Bx W Loc Dev 1st Lesion Img Bx Spec US Guide  Addendum Date: 02/09/2017   ADDENDUM REPORT: 02/07/2017 09:02 ADDENDUM: Pathology of the left breast biopsy revealed A. LEFT BREAST, 1:00; ULTRASOUND-GUIDED CORE BIOPSY: INVASIVE MAMMARY CARCINOMA, NO SPECIAL TYPE. Size of invasive carcinoma: 0.6 cm in this sample. Histologic grade of invasive carcinoma: Grade 1. ER/PR/HER2: Immunohistochemistry will be performed with reflex to [place] for HER2 2+ (equivocal) results. The results will be reported in an addendum. The diagnosis was called to Eino Farber of [facility] on 02/03/17 at 12:05 PM. This was found to be concordant by Dr. Kateri Plummer impression and notes. Recommendation: Surgical and oncology referrals. Results and recommendations were relayed to Evern Bio, NP's nurse by Jetta Lout, [place] on 02/03/17. She requested that the radiologist contact the patient with results and the nurse navigators arrange referrals. At the patient's request, results and recommendations were relayed to the patient by phone by Dr. Theda Sers on 02/03/17. The patient stated she did well following the biopsy. All of her questions were answered. Request for referrals was relayed to Tanya Nones, RN, nurse navigator for [facility]. After contacting the patient, referrals were made to Dr. Tama High for 02/10/17 at 11:00 AM and Dr. Grayland Ormond, oncologist for 02/07/17 at 1:30 PM. The patient has been notified of the appointments. She was encouraged to contact Tanya Nones, RN or the [facility] with any further questions or concerns. Addendum by Jetta Lout, RRA on 02/07/17. Electronically Signed   By: Fidela Salisbury M.D.   On: 02/07/2017 09:02   Result Date: 02/07/2017 CLINICAL DATA:  Left breast 1 o'clock suspicious mass. EXAM: ULTRASOUND GUIDED LEFT BREAST CORE NEEDLE BIOPSY COMPARISON:  Previous exam(s). FINDINGS: I met with the patient and we discussed the  procedure of ultrasound-guided biopsy, including benefits and alternatives. We discussed the high likelihood of a successful procedure. We discussed the risks of the procedure, including infection, bleeding, tissue injury, clip migration, and inadequate sampling. Informed written consent was given. The usual time-out protocol was performed immediately prior to the procedure. Lesion quadrant: Upper outer quadrant. Using sterile technique and 1% Lidocaine as local anesthetic, under direct ultrasound visualization, a 14 gauge spring-loaded device was used to perform biopsy of left breast 1 o'clock mass using a inferior approach. At the conclusion of the procedure a ribbon shaped tissue marker clip was deployed into the biopsy cavity. Follow up 2 view mammogram was performed and dictated separately. IMPRESSION: Ultrasound guided biopsy of left breast 1 o'clock mass. No apparent complications. Electronically Signed: By: Fidela Salisbury M.D. 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  PLAN:    1. 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  Given the HER-2 positivity, she also may benefit from adjuvant chemotherapy as well as extended Herceptin maintenance.  If she has a lumpectomy she will also need XRT.  Finally, because patient has an ER/PR  positive malignancy, she will benefit from an aromatase inhibitor for 5 years at the conclusion of all her treatments.  Patient will follow-up 1-2 weeks after her surgery to discuss her final pathology results and additional treatment planning. 2.  Genetic testing: Given patient's young age and family history of breast cancer in her mother, can consider genetic testing in the near future.  Approximately 60 minutes was spent in discussion of which greater than 50% was consultation.  Patient expressed understanding and was in agreement with this plan. She also understands that She can call clinic at any time with any questions, concerns, or complaints.   Cancer Staging Primary cancer of upper outer quadrant of left female breast [facility]) Staging form: Breast, AJCC 8th Edition - Clinical stage from 02/07/2017: Stage IA (cT1c, cN0, cM0, G1, ER: Positive, PR: Positive, HER2: Positive) - Signed by Lloyd Huger, MD on 02/11/2017   Lloyd Huger, MD   02/11/2017 8:42 AM

## 2017-02-13 NOTE — Addendum Note (Signed)
Addended by: Priscille Heidelberg on: 02/13/2017 10:21 PM   Modules accepted: Orders

## 2017-02-14 ENCOUNTER — Encounter
Admission: RE | Admit: 2017-02-14 | Discharge: 2017-02-14 | Disposition: A | Discharge status: Home / Self Care | Payer: Medicare Other | Source: Ambulatory Visit | Attending: Surgery | Admitting: Surgery

## 2017-02-14 ENCOUNTER — Other Ambulatory Visit: Payer: Self-pay

## 2017-02-14 ENCOUNTER — Telehealth: Payer: Self-pay

## 2017-02-14 ENCOUNTER — Ambulatory Visit
Admission: RE | Admit: 2017-02-14 | Discharge: 2017-02-14 | Disposition: A | Discharge status: Home / Self Care | Payer: Medicare Other | Source: Ambulatory Visit | Attending: Surgery | Admitting: Surgery

## 2017-02-14 DIAGNOSIS — G8929 Other chronic pain: Secondary | ICD-10-CM | POA: Diagnosis not present

## 2017-02-14 DIAGNOSIS — F17200 Nicotine dependence, unspecified, uncomplicated: Secondary | ICD-10-CM | POA: Diagnosis not present

## 2017-02-14 DIAGNOSIS — C50919 Malignant neoplasm of unspecified site of unspecified female breast: Secondary | ICD-10-CM | POA: Insufficient documentation

## 2017-02-14 DIAGNOSIS — Z01811 Encounter for preprocedural respiratory examination: Secondary | ICD-10-CM

## 2017-02-14 DIAGNOSIS — Z01818 Encounter for other preprocedural examination: Secondary | ICD-10-CM | POA: Diagnosis not present

## 2017-02-14 HISTORY — DX: Hyperlipidemia, unspecified: E78.5

## 2017-02-14 LAB — BASIC METABOLIC PANEL
ANION GAP: 11 (ref 5–15)
BUN: 21 mg/dL — ABNORMAL HIGH (ref 6–20)
CO2: 20 mmol/L — ABNORMAL LOW (ref 22–32)
Calcium: 9.5 mg/dL (ref 8.9–10.3)
Chloride: 109 mmol/L (ref 101–111)
Creatinine, Ser: 0.9 mg/dL (ref 0.44–1.00)
GLUCOSE: 87 mg/dL (ref 65–99)
Potassium: 3.7 mmol/L (ref 3.5–5.1)
Sodium: 140 mmol/L (ref 135–145)

## 2017-02-14 LAB — CBC WITH DIFFERENTIAL/PLATELET
BASOS ABS: 0.1 10*3/uL (ref 0–0.1)
Basophils Relative: 1 %
Eosinophils Absolute: 0.2 10*3/uL (ref 0–0.7)
Eosinophils Relative: 3 %
HEMATOCRIT: 42.7 % (ref 35.0–47.0)
HEMOGLOBIN: 14.6 g/dL (ref 12.0–16.0)
LYMPHS PCT: 28 %
Lymphs Abs: 2 10*3/uL (ref 1.0–3.6)
MCH: 34 pg (ref 26.0–34.0)
MCHC: 34.2 g/dL (ref 32.0–36.0)
MCV: 99.4 fL (ref 80.0–100.0)
MONO ABS: 0.4 10*3/uL (ref 0.2–0.9)
Monocytes Relative: 6 %
NEUTROS ABS: 4.6 10*3/uL (ref 1.4–6.5)
NEUTROS PCT: 62 %
Platelets: 254 10*3/uL (ref 150–440)
RBC: 4.3 MIL/uL (ref 3.80–5.20)
RDW: 13.5 % (ref 11.5–14.5)
WBC: 7.3 10*3/uL (ref 3.6–11.0)

## 2017-02-14 MED ORDER — CHLORHEXIDINE GLUCONATE CLOTH 2 % EX PADS
6.0000 | MEDICATED_PAD | Freq: Once | CUTANEOUS | Status: DC
  Filled 2017-02-14: qty 6

## 2017-02-14 NOTE — Pre-Procedure Instructions (Signed)
Called breast center for time of arrival on Friday.  "She is not scheduled", per breast center. Dr Rosana Hoes' office notified regarding above mentioned.  Patient aware.

## 2017-02-14 NOTE — Telephone Encounter (Signed)
I have spoken with Lorriane Shire with [facility] and advised her that per pre-admit, patient was not scheduled with [facility] for her Needle Localization. I informed her that I had sent Dollene Cleveland a message of the surgery date, time and the need for a time for the patient to arrive the day of surgery on 1/14 @ 11:41am and again at 1:48pm. I also sent Dr Rosana Hoes an email on 02/10/17 @ 4:18pm to advise him to add the orders for the Needle localization, in which he then added orders. Per Lorriane Shire with [facility] breast center, Aldona Bar has been out of the office and will return on 02/15/17. She stated that she was not aware of the need for this to be scheduled. After speaking with Lorriane Shire about this she stated that she would speak with her manager to see if they could do the needle localization on 02/17/17 as scheduled due to the message not being obtained for the need of this procedure. Lorriane Shire called me back at 4:22pm on 02/14/17 to let me know that they will be able to do the Needle localization per the manager and that Aldona Bar will contact me in the morning on 02/15/17 to advise me of the arrival time of the patient the day of surgery. Surgery will not be delayed. I will contact patient as soon as I receive an arrival time from [place] with [place].

## 2017-02-14 NOTE — Telephone Encounter (Signed)
Patient called and stated that she was told that her surgery may be cancelled. She is stressed and worried about this due to having to ask her brother for a ride and him agreeing. She fears that if it is cancelled that he will not agree to take her. I verbalized to her that we are working to keep this procedure scheduled so that we do not have to reschedule the surgery. I verbalized to her that we will contact her tomorrow to let her know the status of the surgery. I also verbalized to her not to stress about the situation and that we will figure it out for her. She was thankful and verbalized understanding.

## 2017-02-14 NOTE — Patient Instructions (Addendum)
Your procedure is scheduled on: 02/17/17 Fri Report to breast center    Remember: Instructions that are not followed completely may result in serious medical risk, up to and including death, or upon the discretion of your surgeon and anesthesiologist your surgery may need to be rescheduled.    _x___ 1. Do not eat food after midnight the night before your procedure. You may drink clear liquids up to 2 hours before you are scheduled to arrive at the hospital for your procedure.  Do not drink clear liquids within 2 hours of your scheduled arrival to the hospital.  Clear liquids include  --Water or Apple juice without pulp  --Clear carbohydrate beverage such as ClearFast or Gatorade  --Black Coffee or Clear Tea (No milk, no creamers, do not add anything to                  the coffee or Tea Type 1 and type 2 diabetics should only drink water.  No gum chewing or hard candies.     __x__ 2. No Alcohol for 24 hours before or after surgery.   __x__3. No Smoking for 24 prior to surgery.   ____  4. Bring all medications with you on the day of surgery if instructed.    __x__ 5. Notify your doctor if there is any change in your medical condition     (cold, fever, infections).     Do not wear jewelry, make-up, hairpins, clips or nail polish.  Do not wear lotions, powders, or perfumes. You may wear deodorant.  Do not shave 48 hours prior to surgery. Men may shave face and neck.  Do not bring valuables to the hospital.    [facility] is not responsible for any belongings or valuables.               Contacts, dentures or bridgework may not be worn into surgery.  Leave your suitcase in the car. After surgery it may be brought to your room.  For patients admitted to the hospital, discharge time is determined by your                       treatment team.   Patients discharged the day of surgery will not be allowed to drive home.  You will need someone to drive you home and stay with you the night of  your procedure.    Please read over the following fact sheets that you were given:   [facility] Preparing for Surgery and or MRSA Information   _x___ Take anti-hypertensive listed below, cardiac, seizure, asthma,     anti-reflux and psychiatric medicines. These include:  1LORazepam (ATIVAN) 0.5 MG tablet  2.omeprazole (PRILOSEC) 20 MG capsule  3.oxyCODONE 10 MG TABS  4.topiramate (TOPAMAX) 100 MG tablet  5.  6.  ____Fleets enema or Magnesium Citrate as directed.   _x___ Use CHG Soap or sage wipes as directed on instruction sheet   ____ Use inhalers on the day of surgery and bring to hospital day of surgery  ____ Stop Metformin and Janumet 2 days prior to surgery.    ____ Take 1/2 of usual insulin dose the night before surgery and none on the morning     surgery.   _x___ Follow recommendations from Cardiologist, Pulmonologist or PCP regarding          stopping Aspirin, Coumadin, Plavix ,Eliquis, Effient, or Pradaxa, and Pletal.  X____Stop Anti-inflammatories such as Advil, Aleve, Ibuprofen, Motrin, Naproxen, Naprosyn,  Goodies powders or aspirin products. OK to take Tylenol and                          Celebrex.   _x___ Stop supplements until after surgery.  But may continue Vitamin D, Vitamin B,       and multivitamin. Stop vitamin e until after surgery.   ____ Bring C-Pap to the hospital.

## 2017-02-15 ENCOUNTER — Telehealth: Payer: Self-pay | Admitting: Surgery

## 2017-02-15 ENCOUNTER — Other Ambulatory Visit: Payer: Self-pay

## 2017-02-15 ENCOUNTER — Other Ambulatory Visit: Payer: Self-pay | Admitting: Surgery

## 2017-02-15 DIAGNOSIS — C50919 Malignant neoplasm of unspecified site of unspecified female breast: Secondary | ICD-10-CM

## 2017-02-15 NOTE — Telephone Encounter (Signed)
Pt advised of pre op date/time and sx date. Sx: 02/17/17 with Dr Henderson [place] breast lumpectomy with NL and SN. Pre op: 02/14/17 @ 3:00pm--office interview.   Patient made aware to arrive at [facility] at 7:45am the day of surgery.   Patient understands all information.

## 2017-02-16 NOTE — Progress Notes (Signed)
  Oncology Nurse Navigator Documentation  Navigator Location: [facility] (02/16/17 1600)   )Navigator Encounter Type: Telephone (02/16/17 1600) Telephone: Lahoma Crocker Call;Appt Confirmation/Clarification (02/16/17 1600)                     Treatment Phase: Pre-Tx/Tx Discussion (02/16/17 1600) Barriers/Navigation Needs: Transportation (02/16/17 1600)                          Time Spent with Patient: 15 (02/16/17 1600)  Phoned patient to assess support for surgery 1 /18/19.  Patient reports she has someone to come with her, and stay with her tomorrow.  Confirmed follow-up with Dr. Grayland Ormond on 03/02/17 at 11:00.

## 2017-02-17 ENCOUNTER — Encounter
Admission: RE | Disposition: A | Discharge status: Home / Self Care | Payer: Self-pay | Source: Ambulatory Visit | Attending: Surgery

## 2017-02-17 ENCOUNTER — Ambulatory Visit
Admission: RE | Admit: 2017-02-17 | Discharge: 2017-02-17 | Disposition: A | Discharge status: Home / Self Care | Payer: Medicare Other | Source: Ambulatory Visit | Attending: Surgery | Admitting: Surgery

## 2017-02-17 ENCOUNTER — Encounter
Admission: RE | Admit: 2017-02-17 | Discharge: 2017-02-17 | Disposition: A | Discharge status: Home / Self Care | Payer: Medicare Other | Source: Ambulatory Visit | Attending: Surgery | Admitting: Surgery

## 2017-02-17 ENCOUNTER — Ambulatory Visit: Payer: Medicare Other | Admitting: Anesthesiology

## 2017-02-17 ENCOUNTER — Other Ambulatory Visit: Payer: Self-pay

## 2017-02-17 DIAGNOSIS — N189 Chronic kidney disease, unspecified: Secondary | ICD-10-CM | POA: Diagnosis not present

## 2017-02-17 DIAGNOSIS — E7800 Pure hypercholesterolemia, unspecified: Secondary | ICD-10-CM | POA: Insufficient documentation

## 2017-02-17 DIAGNOSIS — G4733 Obstructive sleep apnea (adult) (pediatric): Secondary | ICD-10-CM | POA: Insufficient documentation

## 2017-02-17 DIAGNOSIS — F329 Major depressive disorder, single episode, unspecified: Secondary | ICD-10-CM | POA: Diagnosis not present

## 2017-02-17 DIAGNOSIS — F411 Generalized anxiety disorder: Secondary | ICD-10-CM | POA: Diagnosis not present

## 2017-02-17 DIAGNOSIS — F17210 Nicotine dependence, cigarettes, uncomplicated: Secondary | ICD-10-CM | POA: Insufficient documentation

## 2017-02-17 DIAGNOSIS — J449 Chronic obstructive pulmonary disease, unspecified: Secondary | ICD-10-CM | POA: Insufficient documentation

## 2017-02-17 DIAGNOSIS — K219 Gastro-esophageal reflux disease without esophagitis: Secondary | ICD-10-CM | POA: Diagnosis not present

## 2017-02-17 DIAGNOSIS — Z170 Estrogen receptor positive status [ER+]: Secondary | ICD-10-CM

## 2017-02-17 DIAGNOSIS — Z79899 Other long term (current) drug therapy: Secondary | ICD-10-CM | POA: Insufficient documentation

## 2017-02-17 DIAGNOSIS — C50919 Malignant neoplasm of unspecified site of unspecified female breast: Secondary | ICD-10-CM

## 2017-02-17 DIAGNOSIS — C50412 Malignant neoplasm of upper-outer quadrant of left female breast: Secondary | ICD-10-CM

## 2017-02-17 DIAGNOSIS — G2581 Restless legs syndrome: Secondary | ICD-10-CM | POA: Diagnosis not present

## 2017-02-17 DIAGNOSIS — M797 Fibromyalgia: Secondary | ICD-10-CM | POA: Diagnosis not present

## 2017-02-17 DIAGNOSIS — E0590 Thyrotoxicosis, unspecified without thyrotoxic crisis or storm: Secondary | ICD-10-CM | POA: Diagnosis not present

## 2017-02-17 DIAGNOSIS — I129 Hypertensive chronic kidney disease with stage 1 through stage 4 chronic kidney disease, or unspecified chronic kidney disease: Secondary | ICD-10-CM | POA: Insufficient documentation

## 2017-02-17 HISTORY — PX: BREAST LUMPECTOMY WITH NEEDLE LOCALIZATION: SHX5759

## 2017-02-17 HISTORY — PX: SENTINEL NODE BIOPSY: SHX6608

## 2017-02-17 HISTORY — PX: BREAST LUMPECTOMY: SHX2

## 2017-02-17 SURGERY — BREAST LUMPECTOMY WITH NEEDLE LOCALIZATION
Anesthesia: General | Site: Breast | Laterality: Left | Wound class: Clean

## 2017-02-17 MED ORDER — SUGAMMADEX SODIUM 200 MG/2ML IV SOLN
INTRAVENOUS | Status: AC
  Filled 2017-02-17: qty 2

## 2017-02-17 MED ORDER — LACTATED RINGERS IV SOLN
INTRAVENOUS | Status: DC | PRN
  Administered 2017-02-17: 11:00:00 via INTRAVENOUS

## 2017-02-17 MED ORDER — LACTATED RINGERS IV SOLN
INTRAVENOUS | Status: DC
  Administered 2017-02-17: 10:00:00 via INTRAVENOUS

## 2017-02-17 MED ORDER — EPHEDRINE SULFATE 50 MG/ML IJ SOLN
INTRAMUSCULAR | Status: DC | PRN
  Administered 2017-02-17: 10 mg via INTRAVENOUS

## 2017-02-17 MED ORDER — PHENYLEPHRINE HCL 10 MG/ML IJ SOLN
INTRAMUSCULAR | Status: DC | PRN
  Administered 2017-02-17: 100 ug via INTRAVENOUS
  Administered 2017-02-17: 50 ug via INTRAVENOUS
  Administered 2017-02-17: 100 ug via INTRAVENOUS
  Administered 2017-02-17: 50 ug via INTRAVENOUS
  Administered 2017-02-17 (×4): 100 ug via INTRAVENOUS
  Administered 2017-02-17: 50 ug via INTRAVENOUS

## 2017-02-17 MED ORDER — FENTANYL CITRATE (PF) 100 MCG/2ML IJ SOLN: 25.0000 ug | INTRAMUSCULAR | Status: DC | PRN

## 2017-02-17 MED ORDER — LIDOCAINE-EPINEPHRINE (PF) 1 %-1:200000 IJ SOLN
INTRAMUSCULAR | Status: DC | PRN
  Administered 2017-02-17: 10 mL

## 2017-02-17 MED ORDER — ONDANSETRON HCL 4 MG/2ML IJ SOLN: 4.0000 mg | Freq: Once | INTRAMUSCULAR | Status: DC | PRN

## 2017-02-17 MED ORDER — ISOSULFAN BLUE 1 % ~~LOC~~ SOLN
SUBCUTANEOUS | Status: DC | PRN
  Administered 2017-02-17: 4 mL via SUBCUTANEOUS

## 2017-02-17 MED ORDER — PROPOFOL 10 MG/ML IV BOLUS
INTRAVENOUS | Status: AC
  Filled 2017-02-17: qty 20

## 2017-02-17 MED ORDER — SUGAMMADEX SODIUM 500 MG/5ML IV SOLN
INTRAVENOUS | Status: DC | PRN
  Administered 2017-02-17: 121.6 mg via INTRAVENOUS

## 2017-02-17 MED ORDER — ONDANSETRON HCL 4 MG/2ML IJ SOLN
INTRAMUSCULAR | Status: DC | PRN
  Administered 2017-02-17: 4 mg via INTRAVENOUS

## 2017-02-17 MED ORDER — OXYCODONE HCL 5 MG PO TABS: 5.0000 mg | ORAL_TABLET | ORAL | 0 refills | Status: DC | PRN

## 2017-02-17 MED ORDER — FENTANYL CITRATE (PF) 100 MCG/2ML IJ SOLN
INTRAMUSCULAR | Status: AC
  Filled 2017-02-17: qty 2

## 2017-02-17 MED ORDER — MIDAZOLAM HCL 2 MG/2ML IJ SOLN
INTRAMUSCULAR | Status: DC | PRN
  Administered 2017-02-17: 2 mg via INTRAVENOUS

## 2017-02-17 MED ORDER — METHYLENE BLUE 0.5 % INJ SOLN
INTRAVENOUS | Status: AC
  Filled 2017-02-17: qty 10

## 2017-02-17 MED ORDER — ISOSULFAN BLUE 1 % ~~LOC~~ SOLN
SUBCUTANEOUS | Status: AC
  Filled 2017-02-17: qty 5

## 2017-02-17 MED ORDER — DEXAMETHASONE SODIUM PHOSPHATE 10 MG/ML IJ SOLN
INTRAMUSCULAR | Status: AC
  Filled 2017-02-17: qty 1

## 2017-02-17 MED ORDER — LIDOCAINE-EPINEPHRINE 1 %-1:100000 IJ SOLN
INTRAMUSCULAR | Status: AC
  Filled 2017-02-17: qty 1

## 2017-02-17 MED ORDER — ONDANSETRON HCL 4 MG/2ML IJ SOLN
INTRAMUSCULAR | Status: AC
  Filled 2017-02-17: qty 2

## 2017-02-17 MED ORDER — BUPIVACAINE HCL (PF) 0.5 % IJ SOLN
INTRAMUSCULAR | Status: AC
  Filled 2017-02-17: qty 30

## 2017-02-17 MED ORDER — DEXAMETHASONE SODIUM PHOSPHATE 10 MG/ML IJ SOLN
INTRAMUSCULAR | Status: DC | PRN
  Administered 2017-02-17: 8 mg via INTRAVENOUS

## 2017-02-17 MED ORDER — MIDAZOLAM HCL 2 MG/2ML IJ SOLN
INTRAMUSCULAR | Status: AC
  Filled 2017-02-17: qty 2

## 2017-02-17 MED ORDER — TECHNETIUM TC 99M SULFUR COLLOID FILTERED
0.7060 | Freq: Once | INTRAVENOUS | Status: AC | PRN
  Administered 2017-02-17: 0.706 via INTRADERMAL

## 2017-02-17 MED ORDER — ROCURONIUM BROMIDE 100 MG/10ML IV SOLN
INTRAVENOUS | Status: DC | PRN
  Administered 2017-02-17: 40 mg via INTRAVENOUS

## 2017-02-17 MED ORDER — LIDOCAINE HCL (CARDIAC) 20 MG/ML IV SOLN
INTRAVENOUS | Status: DC | PRN
Start: 2017-02-17 — End: 2017-02-17
  Administered 2017-02-17: 40 mg via INTRAVENOUS

## 2017-02-17 MED ORDER — PROPOFOL 10 MG/ML IV BOLUS
INTRAVENOUS | Status: DC | PRN
  Administered 2017-02-17: 150 mg via INTRAVENOUS

## 2017-02-17 MED ORDER — FENTANYL CITRATE (PF) 100 MCG/2ML IJ SOLN
INTRAMUSCULAR | Status: DC | PRN
  Administered 2017-02-17 (×2): 50 ug via INTRAVENOUS

## 2017-02-17 MED ORDER — BUPIVACAINE HCL (PF) 0.5 % IJ SOLN
INTRAMUSCULAR | Status: DC | PRN
  Administered 2017-02-17: 10 mL

## 2017-02-17 MED ORDER — VANCOMYCIN HCL IN DEXTROSE 1-5 GM/200ML-% IV SOLN
1000.0000 mg | INTRAVENOUS | Status: AC
  Administered 2017-02-17: 1000 mg via INTRAVENOUS

## 2017-02-17 SURGICAL SUPPLY — 33 items
ADH SKN CLS APL DERMABOND .7 (GAUZE/BANDAGES/DRESSINGS) ×2
BLADE SURG 15 STRL LF DISP TIS (BLADE) ×2 IMPLANT
BLADE SURG 15 STRL SS (BLADE) ×3
CANISTER SUCT 1200ML W/VALVE (MISCELLANEOUS) ×3 IMPLANT
CHLORAPREP W/TINT 26ML (MISCELLANEOUS) ×3 IMPLANT
COVER PROBE FLX POLY STRL (MISCELLANEOUS) ×3 IMPLANT
DERMABOND ADVANCED (GAUZE/BANDAGES/DRESSINGS) ×1
DERMABOND ADVANCED .7 DNX12 (GAUZE/BANDAGES/DRESSINGS) ×2 IMPLANT
DEVICE DUBIN SPECIMEN MAMMOGRA (MISCELLANEOUS) ×3 IMPLANT
DRAPE LAPAROTOMY TRNSV 106X77 (MISCELLANEOUS) ×3 IMPLANT
ELECT CAUTERY BLADE 6.4 (BLADE) ×3 IMPLANT
ELECT REM PT RETURN 9FT ADLT (ELECTROSURGICAL) ×3
ELECTRODE REM PT RTRN 9FT ADLT (ELECTROSURGICAL) ×2 IMPLANT
GLOVE BIO SURGEON STRL SZ7.5 (GLOVE) ×3 IMPLANT
GLOVE INDICATOR 8.0 STRL GRN (GLOVE) ×3 IMPLANT
GOWN STRL REUS W/ TWL LRG LVL3 (GOWN DISPOSABLE) ×2 IMPLANT
GOWN STRL REUS W/ TWL XL LVL3 (GOWN DISPOSABLE) ×2 IMPLANT
GOWN STRL REUS W/TWL LRG LVL3 (GOWN DISPOSABLE) ×3
GOWN STRL REUS W/TWL XL LVL3 (GOWN DISPOSABLE) ×3
LABEL OR SOLS (LABEL) ×3 IMPLANT
MARGIN MAP 10MM (MISCELLANEOUS) ×3 IMPLANT
NDL HYPO 25X1 1.5 SAFETY (NEEDLE) ×1 IMPLANT
NEEDLE HYPO 25X1 1.5 SAFETY (NEEDLE) ×3 IMPLANT
PACK BASIN MINOR ARMC (MISCELLANEOUS) ×3 IMPLANT
SPONGE XRAY 4X4 16PLY STRL (MISCELLANEOUS) ×3 IMPLANT
SUT MNCRL 4-0 (SUTURE) ×3
SUT MNCRL 4-0 27XMFL (SUTURE) ×2
SUT SILK 2 0 SH (SUTURE) ×3 IMPLANT
SUT VIC AB 3-0 SH 27 (SUTURE) ×3
SUT VIC AB 3-0 SH 27X BRD (SUTURE) ×2 IMPLANT
SUTURE MNCRL 4-0 27XMF (SUTURE) ×2 IMPLANT
SYR 10ML LL (SYRINGE) ×3 IMPLANT
WATER STERILE IRR 1000ML POUR (IV SOLUTION) ×3 IMPLANT

## 2017-02-17 NOTE — Discharge Instructions (Addendum)
In addition to included general post-operative instructions for Left Breast Lumpectomy with Excisional Biopsy of Left Sentinel Lymph Nodes,  Diet: Resume home heart healthy diet.   Activity: No heavy lifting >15-20 pounds (children, pets, laundry, garbage) or strenuous activity until follow-up, but light activity and walking are encouraged. Do not drive or drink alcohol if taking narcotic pain medications.  Wound care: 2 days after surgery (Sunday, 1/20), may shower/get incision wet with soapy water and pat dry (do not rub incisions), but no baths or submerging incision underwater until follow-up.   Medications: Resume all home medications. For mild to moderate pain: acetaminophen (Tylenol) or ibuprofen/naproxen (if no kidney disease). Combining Tylenol with alcohol can substantially increase your risk of causing liver disease. Narcotic pain medications, if prescribed, can be used for severe pain, though may cause nausea, constipation, and drowsiness. Do not combine Tylenol and Percocet (or similar) within a 6 hour period as Percocet (and similar) contain(s) Tylenol. If you do not need the narcotic pain medication, you do not need to fill the prescription.  Call office [phone]) at any time if any questions, worsening pain, fevers/chills, bleeding, drainage from incision site, or other concerns.   AMBULATORY SURGERY  DISCHARGE INSTRUCTIONS   1) The drugs that you were given will stay in your system until tomorrow so for the next 24 hours you should not:  A) Drive an automobile B) Make any legal decisions C) Drink any alcoholic beverage   2) You may resume regular meals tomorrow.  Today it is better to start with liquids and gradually work up to solid foods.  You may eat anything you prefer, but it is better to start with liquids, then soup and crackers, and gradually work up to solid foods.   3) Please notify your doctor immediately if you have any unusual bleeding, trouble  breathing, redness and pain at the surgery site, drainage, fever, or pain not relieved by medication.    4) Additional Instructions: TAKE A STOOL SOFTENER TWICE A DAY WHILE TAKING NARCOTIC PAIN MEDICINE TO PREVENT CONSTIPATION   Please contact your physician with any problems or Same Day Surgery at [phone], Monday through Friday 6 am to 4 pm, or [place] at [facility] number at [phone].

## 2017-02-17 NOTE — Anesthesia Procedure Notes (Signed)
Procedure Name: Intubation Date/Time: 02/17/2017 11:10 AM Performed by: Allean Found, CRNA Pre-anesthesia Checklist: Patient identified, Emergency Drugs available, Suction available, Patient being monitored and Timeout performed Patient Re-evaluated:Patient Re-evaluated prior to induction Oxygen Delivery Method: Circle system utilized Preoxygenation: Pre-oxygenation with 100% oxygen Induction Type: IV induction Ventilation: Mask ventilation without difficulty Laryngoscope Size: Mac and 3 Grade View: Grade I Tube type: Oral Tube size: 7.0 mm Number of attempts: 1 Airway Equipment and Method: Stylet Placement Confirmation: ETT inserted through vocal cords under direct vision,  positive ETCO2 and breath sounds checked- equal and bilateral Secured at: 20 cm Tube secured with: Tape Dental Injury: Teeth and Oropharynx as per pre-operative assessment

## 2017-02-17 NOTE — Op Note (Signed)
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 (icd-10: C50.412)  PROCEDURE(S):  1.) Left partial mastectomy/lumpectomy with image-guided wire localization (cpt: 19301) 2.) Excisional biopsy of Left axillary deep sentinel lymph nodes using radiolabel and lymphazurine blue dye (cpt: 56213)  INTRAOPERATIVE FINDINGS: Wire-localized mammary tissue removed in its entirety including biopsy-associated winged clip, confirmed central within specimen intra-operatively by radiology, maximum pre-SLN biopsy radiotracer count: 116, maximum radiotracer count in 1st specimen: 64 with +blue dye, maximum radiotracer count in 2nd specimen: 27 with +blue dye  INTRAVENOUS FLUIDS: 500 mL crystalloid   ESTIMATED BLOOD LOSS: Minimal (<20 mL)  URINE OUTPUT: No Foley   SPECIMENS: Left breast lumpectomy/partial mastectomy and Left axillary deep sentinel lymph nodes  IMPLANTS: None  DRAINS: none  COMPLICATIONS: None apparent  CONDITION AT END OF PROCEDURE: Hemodynamically stable and extubated  DISPOSITION OF PATIENT: PACU  INDICATIONS FOR PROCEDURE:  52 y.o. femalepresented for evaluation of abnormal Left breast diagnostic mammogram with focal breast ultrasound and core needle biopsy findings consistent with <15 mm invasive carcinoma, for which excision was advised. All risks, benefits, and alternatives to lumpectomy + radiation therapy vs mastectomy were discussed with the patient, all of patient's questions were answered to her expressed satisfaction, patient elected to proceed with lumpectomy/partial mastectomy, including excisional biopsy of Left axillary sentinel lymph nodes, and informed consent was  obtained and documented.  DETAILS OF PROCEDURE: Pre-operative needle-/wire-localization was performed by radiology, and localization studies were reviewed. Radiology called to inform that patient inadvertently advanced her localization wire beyond the tumor and associated clip. The malpositioned wire reportedly could not be removed due to barb at the wire's end, and therefor a second localization wire was added. Excision of tissue beyond the malpositioned wire to remove the wire in its entirety was discussed with the patient in the pre-operative holding area, for which patient expressed understanding and agreed.  Patient was brought to the operating suite and appropriately identified. General anesthesia was administered along with appropriate pre-operative antibiotics, and endotracheal intubation was performed by anesthetist. In supine position, operative site was prepped and draped in the usual sterile fashion, and following a brief time out, maximum Left axillary radiotracer count was checked using probe/detector. 1 mL aliquots x 5 of lymphazurine blue dye were injected peri-tumor and massaged into the tissue x 5 minutes. Subsequently, local anesthetic was injected, and an upper breast incision parallel to circumareolar line was made using a #15 blade scalpel starting with incorporation of the wire entry site and extending for a total of ~3 cm, around which appropriate thickness skin flaps were created and excision was continued deep to chest wall to include the localization wires in their entirety with appropriately wide margins. Metallic radiopaque markers were sutured to the specimen and used to orient the specimen, which was then handed off the field for radiographic assessment and pathology processing. Hemostasis was confirmed, and the wound was re-approximated in layers using buried interrupted 3-0 Vicryl for dermis and 4-0 Monocryl running subcuticular suture to re-approximate epidermis.   Attention  was then directed to the Left axilla, where axillary hair line, pectoralis muscle, and latissimus dorsal muscle were marked using a skin marking pen. Local anesthetic was injected over site of maximum radiotracer counts, and an ~2 cm curvilinear incision was made and extended deep through subcutaneous  tissue using blunt dissection and selective electrocautery, tying any small venous or lymphatic branches using silk ties. Detected counts, however, were found to be very low, and tissue containing discrete focus of blue dye was identified and excised. Specimen x 2 contained maximum radiotracer counts, while there remained no further radiotracer counts, nor blue dye, in the remaining axillary bed. Radiographic confirmation that the entire lesion was excised -- including the localizing wire and clip -- was received prior to completion of closure. Left axillary dermis was re-approximated buried interrupted 3-0 Vicryl for dermis and 4-0 Monocryl for epidermis, after which skin was then cleaned and dried, and sterile Dermabond skin glue was applied and allowed to dry.   Patient was then safely able to be extubated, awakened, and transferred to PACU for post-operative monitoring and care.  I was present for all aspects of the above procedure, and there were no complications apparent.

## 2017-02-17 NOTE — OR Nursing (Signed)
Dr Marcello Moores aware patient had some chest wall pain last evening. None today and this is not abnormal for her.  MD reviewed hx, assessed patient and reviewed ekg.  No new orders.

## 2017-02-17 NOTE — Transfer of Care (Signed)
Immediate Anesthesia Transfer of Care Note  Patient: Briana [name]  Procedure(s) Performed: BREAST LUMPECTOMY WITH NEEDLE LOCALIZATION (Left Breast) SENTINEL NODE BIOPSY (Left Axilla)  Patient Location: PACU  Anesthesia Type:General  Level of Consciousness: awake  Airway & Oxygen Therapy: Patient Spontanous Breathing and Patient connected to face mask oxygen  Post-op Assessment: Report given to RN and Post -op Vital signs reviewed and stable  Post vital signs: Reviewed and stable  Last Vitals:  Vitals:   02/17/17 0944 02/17/17 1344  BP: (!) 89/63 (!) 123/99  Pulse:  95  Resp: 16 (!) 28  Temp: 36.7 C (!) 36.2 C  SpO2: 100% 99%    Last Pain:  Vitals:   02/17/17 1344  TempSrc:   PainSc: Asleep         Complications: No apparent anesthesia complications

## 2017-02-17 NOTE — Interval H&P Note (Signed)
History and Physical Interval Note:  02/17/2017 9:41 AM  Briana [name]  has presented today for surgery, with the diagnosis of INVASIVE MAMMARY CARCINOMA  The various methods of treatment have been discussed with the patient and family. After consideration of risks, benefits and other options for treatment, the patient has consented to  Procedure(s): BREAST LUMPECTOMY WITH NEEDLE LOCALIZATION (Left) SENTINEL NODE BIOPSY (Left) as a surgical intervention .  The patient's history has been reviewed, patient examined, no change in status, stable for surgery.  I have reviewed the patient's chart and labs.  Questions were answered to the patient's satisfaction.     Vickie Epley

## 2017-02-17 NOTE — Anesthesia Post-op Follow-up Note (Signed)
Anesthesia QCDR form completed.        

## 2017-02-17 NOTE — Anesthesia Postprocedure Evaluation (Signed)
Anesthesia Post Note  Patient: Briana [name]  Procedure(s) Performed: BREAST LUMPECTOMY WITH NEEDLE LOCALIZATION (Left Breast) SENTINEL NODE BIOPSY (Left Axilla)  Patient location during evaluation: PACU Anesthesia Type: General Level of consciousness: awake and alert Pain management: pain level controlled Vital Signs Assessment: post-procedure vital signs reviewed and stable Respiratory status: spontaneous breathing, nonlabored ventilation, respiratory function stable and patient connected to nasal cannula oxygen Cardiovascular status: blood pressure returned to baseline and stable Postop Assessment: no apparent nausea or vomiting Anesthetic complications: no     Last Vitals:  Vitals:   02/17/17 1420 02/17/17 1434  BP:  (!) 112/42  Pulse: 91 76  Resp: 18 16  Temp: (!) 36.2 C (!) 35.9 C  SpO2: 99% 100%    Last Pain:  Vitals:   02/17/17 1434  TempSrc: Temporal  PainSc: 3                  Emillio Ngo S

## 2017-02-17 NOTE — Anesthesia Preprocedure Evaluation (Signed)
Anesthesia Evaluation  Patient identified by MRN, date of birth, ID band Patient awake    Reviewed: Allergy & Precautions, NPO status , Patient's Chart, lab work & pertinent test results, reviewed documented beta blocker date and time   Airway Mallampati: II  TM Distance: >3 FB     Dental  (+) Chipped   Pulmonary asthma , sleep apnea , COPD, Current Smoker,           Cardiovascular      Neuro/Psych  Headaches, PSYCHIATRIC DISORDERS Anxiety Depression Bipolar Disorder  Neuromuscular disease    GI/Hepatic GERD  Controlled,  Endo/Other  Hyperthyroidism   Renal/GU Renal disease     Musculoskeletal  (+) Arthritis , Fibromyalgia -  Abdominal   Peds  Hematology   Anesthesia Other Findings   Reproductive/Obstetrics                             Anesthesia Physical Anesthesia Plan  ASA: III  Anesthesia Plan: General   Post-op Pain Management:    Induction: Intravenous  PONV Risk Score and Plan:   Airway Management Planned: LMA  Additional Equipment:   Intra-op Plan:   Post-operative Plan:   Informed Consent: I have reviewed the patients History and Physical, chart, labs and discussed the procedure including the risks, benefits and alternatives for the proposed anesthesia with the patient or authorized representative who has indicated his/her understanding and acceptance.     Plan Discussed with: CRNA  Anesthesia Plan Comments:         Anesthesia Quick Evaluation

## 2017-02-18 ENCOUNTER — Encounter: Payer: Self-pay | Admitting: Surgery

## 2017-02-20 ENCOUNTER — Telehealth: Payer: Self-pay | Admitting: General Practice

## 2017-02-20 LAB — SURGICAL PATHOLOGY

## 2017-02-20 NOTE — Telephone Encounter (Signed)
Called patient back to let her know that her pharmacy uuwould not release her prescription because she is currently taking Oxycodone 10 MG by Dr. Primus Bravo and is under contract. I told her that if she needed additional pain medication, that she could take Acetaminophen 1000 MG every 8 hours. Patient was not too happy to hear and hung up.  I will send her an appointment reminder by mail.

## 2017-02-20 NOTE — Telephone Encounter (Signed)
Patient called and left a message on Saturday said she needs to talk about one of her medications please call patient and advise.

## 2017-02-23 ENCOUNTER — Encounter: Payer: Self-pay | Admitting: *Deleted

## 2017-02-23 ENCOUNTER — Telehealth: Payer: Self-pay | Admitting: Genetic Counselor

## 2017-02-23 ENCOUNTER — Encounter: Payer: Self-pay | Admitting: Surgery

## 2017-02-23 ENCOUNTER — Other Ambulatory Visit: Payer: Self-pay | Admitting: *Deleted

## 2017-02-23 ENCOUNTER — Ambulatory Visit (INDEPENDENT_AMBULATORY_CARE_PROVIDER_SITE_OTHER): Payer: Medicare Other | Admitting: Surgery

## 2017-02-23 VITALS — BP 92/64 | HR 93 | Temp 98.2°F | Ht 61.0 in | Wt 134.0 lb

## 2017-02-23 DIAGNOSIS — C50412 Malignant neoplasm of upper-outer quadrant of left female breast: Secondary | ICD-10-CM

## 2017-02-23 NOTE — Progress Notes (Signed)
  Oncology Nurse Navigator Documentation  Navigator Location: [facility] (02/23/17 1300)   )Navigator Encounter Type: Telephone (02/23/17 1300) Telephone: [place] Call (02/23/17 1300)     Surgery Date: 02/17/17 (02/23/17 1300)           Treatment Initiated Date: 02/17/17 (02/23/17 1300)     Barriers/Navigation Needs: Education;Coordination of Care (02/23/17 1300) Education: Other (02/23/17 1300) Interventions: Referrals;Coordination of Care (02/23/17 1300) Referrals: Genetics (02/23/17 1300)   Education Method: Verbal (02/23/17 1300)                Time Spent with Patient: 45 (02/23/17 1300)   Called patient today to follow-up post surgery.  States is doing fair.  States she has new information for me regarding her family history of cancer.  New family history of cancer is as follows:  Mom had breast cancer, maternal grandfather had colon cancer, maternal aunt had colon cancer, maternal cousin had colon cancer with a genetic mutation, sister had colon cancer with a genetic mutation, and a maternal cousin had breast cancer.  See full family history in Epic.  Discussed need for genetic testing.  She desires to move forward with talking with a genetic counselor and getting tested.  Will cc [facility] our genetic counselor and Dr. Grayland Ormond.  If possible she would like to have her blood drawn when she returns for follow up with Dr. Grayland Ormond on 03/02/17.  She is to call if she has any questions or needs.

## 2017-02-23 NOTE — Patient Instructions (Addendum)
We will not need to see you back in office until we schedule your Mammogram for you to follow up with Dr. Rosana Hoes.  We can send a referral for Smoking Cessation classes to help you quit smoking if you would like.  If you have any questions or concerns please give our office a call.   Please continue to do your monthly self exams and if you find a new lump or any concerns at all, call our office immediately.   Breast Self-Awareness Introduction Breast self-awareness means:  Knowing how your breasts look.  Knowing how your breasts feel.  Checking your breasts every month for changes.  Telling your doctor if you notice a change in your breasts. Breast self-awareness allows you to notice a breast problem early while it is still small. How to do a breast self-exam One way to learn what is normal for your breasts and to check for changes is to do a breast self-exam. To do a breast self-exam: Look for Changes  1. Take off all the clothes above your waist. 2. Stand in front of a mirror in a room with good lighting. 3. Put your hands on your hips. 4. Push your hands down. 5. Look at your breasts and nipples in the mirror to see if one breast or nipple looks different than the other. Check to see if:  The shape of one breast is different.  The size of one breast is different.  There are wrinkles, dips, and bumps in one breast and not the other. 6. Look at each breast for changes in your skin, such as:  Redness.  Scaly areas. 7. Look for changes in your nipples, such as:  Liquid around the nipples.  Bleeding.  Dimpling.  Redness.  A change in where the nipples are. Feel for Changes 1. Lie on your back on the floor. 2. Feel each breast. To do this, follow these steps:  Pick a breast to feel.  Put the arm closest to that breast above your head.  Use your other arm to feel the nipple area of your breast. Feel the area with the pads of your three middle fingers by making  small circles with your fingers. For the first circle, press lightly. For the second circle, press harder. For the third circle, press even harder.  Keep making circles with your fingers at the light, harder, and even harder pressures as you move down your breast. Stop when you feel your ribs.  Move your fingers a little toward the center of your body.  Start making circles with your fingers again, this time going up until you reach your collarbone.  Keep making up and down circles until you reach your armpit. Remember to keep using the three pressures.  Feel the other breast in the same way. 3. Sit or stand in the shower or tub. 4. With soapy water on your skin, feel each breast the same way you did in step 2, when you were lying on the floor. Write Down What You Find  After doing the self-exam, write down:  What is normal for each breast.  Any changes you find in each breast.  When you last had your period. How often should I check my breasts? Check your breasts every month. If you are breastfeeding, the best time to check them is after you feed your baby or after you use a breast pump. If you get periods, the best time to check your breasts is 5-7 days after your  period is over. When should I see my doctor? See your doctor if you notice:  A change in shape or size of your breasts or nipples.  A change in the skin of your breast or nipples, such as red or scaly skin.  Unusual fluid coming from your nipples.  A lump or thick area that was not there before.  Pain in your breasts.  Anything that concerns you. This information is not intended to replace advice given to you by your health care provider. Make sure you discuss any questions you have with your health care provider. Document Released: 07/06/2007 Document Revised: 06/25/2015 Document Reviewed: 12/07/2014  2017 [employer]vier

## 2017-02-23 NOTE — Telephone Encounter (Signed)
Tanya Nones, Nurse Navigator, is referring Briana [name] for genetic counseling due to a personal and family history of cancer. I left her a message to call and schedule this telegenetics visit to be done by phone at her convenience.   Steele Berg, [place], [place] Genetic Counselor Phone: [phone]

## 2017-02-23 NOTE — Progress Notes (Signed)
Surgical Clinic Progress/Follow-up Note   HPI:  52 y.o. Female presents to clinic for post-op follow-up evaluation 1 week s/p Left breast lumpectomy/partial mastectomy with Left axillary SLN biopsy. Patient reports minimal to no pain, denies redness, drainage, fever/chills, N/V, CP, or SOB, says she has not discussed pathology results yet, has an appointment scheduled with Dr. Grayland Ormond.  Review of Systems:  Constitutional: denies any other weight loss, fever, chills, or sweats  Eyes: denies any other vision changes, history of eye injury  ENT: denies sore throat, hearing problems  Respiratory: denies shortness of breath, wheezing  Cardiovascular: denies chest pain, palpitations Breast: no pain, erythema, or drainage as per HPI Gastrointestinal: denies abdominal pain, N/V, or diarrhea Musculoskeletal: denies any other joint pains or cramps  Skin: Denies any other rashes or skin discolorations  Neurological: denies any other headache, dizziness, weakness  Psychiatric: denies any other depression, anxiety  All other review of systems: otherwise negative   Vital Signs:  BP 92/64   Pulse 93   Temp 98.2 F (36.8 C) (Oral)   Ht _0  (1.549 m)   Wt 134 lb (60.8 kg)   LMP 08/25/2006   BMI 25.32 kg/m    Physical Exam:  Constitutional:  -- Normal body habitus  -- Awake, alert, and oriented x3  Eyes:  -- Pupils equally round and reactive to light  -- No scleral icterus  Ear, nose, throat:  -- No jugular venous distension  -- No nasal drainage, bleeding Pulmonary:  -- No crackles -- Equal breath sounds bilaterally -- Breathing non-labored at rest Cardiovascular:  -- S1, S2 present  -- No pericardial rubs Breast: -- Left breast non-tender to palpation -- Post-lumpectomy incision well-approximated without any erythema or drainage -- Left axillary incision likewise well-approximated without erythema or drainage Gastrointestinal:  -- Soft, nontender, non-distended, no  guarding/rebound  -- No abdominal masses appreciated, pulsatile or otherwise  Musculoskeletal / Integumentary:  -- Wounds or skin discoloration: None appreciated except as above (Left breast)  -- Extremities: B/L UE and LE FROM, hands and feet warm, no edema  Neurologic:  -- Motor function: intact and symmetric  -- Sensation: intact and symmetric   Pathology (02/17/2017):  A. 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  - ALL MARGINS ARE NEGATIVE FOR INVASIVE AND IN SITU CARCINOMA.   B. SENTINEL LYMPH NODE, LEFT AXILLA; EXCISION:  - BENIGN FIBROADIPOSE TISSUE.  Comment:  ER, PR, and HER2 were assessed on the previous core biopsy  ([id number], 02/02/17, slides were reviewed) with results summarized as  follows:  ER IHC (SP1): Positive, >90%  PR IHC (IE2): Positive, >90%  HER2 IHC (HercepTest): Equivocal, score 2+  HER2 FISH (DAKO IQFISH PharmDX): POSITIVE (amplified), HER2/CEP17 ratio  2.56, average number of HER2 signals per nucleus 4.1  INVASIVE CARCINOMA OF THE BREAST  Procedure: Excision  Specimen Laterality: Left  Histologic Type: Invasive carcinoma of no special type  Histologic Grade ([place] Histologic Score)       Glandular (Acinar)/Tubular Differentiation: Score 3       Nuclear Pleomorphism: Score 2       Mitotic Rate: Score 1       Overall Grade: Grade 2  Tumor Size: 11 mm  Ductal Carcinoma In Situ (DCIS): Present, low nuclear grade, negative  for extensive intraductal component  Margins:  Invasive carcinoma margins: Negative for invasive carcinoma    Distance from closest margin: 2 mm, superior margin  DCIS margins: Negative  for DCIS    Distance from closest margin: 7 mm, superior margin  Assessment:  52 y.o. yo Female with a problem list including...  Patient Active Problem List   Diagnosis Date Noted  . Rotator cuff tear 02/07/2017  .  Shoulder pain 02/07/2017  . Primary cancer of upper outer quadrant of left female breast ([place]) 02/07/2017  . History of hysterectomy 03/16/2016  . Rotator cuff (capsule) sprain 10/15/2015  . S/P rotator cuff repair 10/14/2015  . Atypical squamous cells of undetermined significance on cytologic smear of cervix (ASC-US) 05/13/2015  . DDD (degenerative disc disease), cervical 07/21/2014  . Status post cervical spinal fusion 07/21/2014  . DDD (degenerative disc disease), thoracic 07/21/2014  . DDD (degenerative disc disease), lumbar 07/21/2014  . Facet syndrome, lumbar 07/21/2014  . Sacroiliac joint dysfunction 07/21/2014  . Occipital neuralgia 07/21/2014  . DJD of shoulder 07/21/2014  . Intractable migraine with aura without status migrainosus 02/27/2014  . Bipolar disorder ([place]) 02/17/2014  . CAFL (chronic airflow limitation) ([place]) 02/17/2014  . Fibrositis 02/17/2014  . Cephalalgia 02/17/2014  . HLD (hyperlipidemia) 02/17/2014  . Impaired renal function 02/17/2014  . Restless leg 02/17/2014  . Apnea, sleep 02/17/2014  . Chronic pain associated with significant psychosocial dysfunction 11/28/2013    presents to clinic for post-op follow-up evaluation, recovering and doing well s/p Left breast lumpectomy with non-diagnostic axillary SLN biopsy.  Plan:   - discussed with patient pathology results  - no heavy lifting >20 lbs x 1 more week, okay to shower with soapy water and pat dry, no submerging under water until one more week likewise  - communicated with patient discussion with Dr. Grayland Ormond re: non-diagnostic Left axillary SLN biopsy, will follow up Oncotype testing and anticipate no further Left axillary surgery if low-risk, oncology to refer back if otherwise  - genetic testing as per oncology (strong family history of colorectal malignancies in particular, as well as isolated lung, cervical, and brain)  - our office will schedule follow-up mammogram pending completion of radiation  therapy  - continued efforts towards smoking cessation discussed and strongly encouraged  - return to clinic following mammogram, instructed to call if any questions or concerns  - follow-up screening colonoscopy as previously instructed per GI  All of the above recommendations were discussed with the patient, and all of patient's questions were answered to her expressed satisfaction.  -- Marilynne Drivers Rosana Hoes, MD, [place]: [facility] - Partnering for exceptional care. Office: [phone]

## 2017-02-24 ENCOUNTER — Encounter: Payer: Self-pay | Admitting: Genetic Counselor

## 2017-02-24 ENCOUNTER — Telehealth: Payer: Self-pay | Admitting: Genetic Counselor

## 2017-02-24 NOTE — Telephone Encounter (Signed)
Cancer Genetics            Telegenetics Initial Visit    Patient Name: Briana [name] Patient DOB: Aug 18, 1965 Patient Age: 53 y.o. Phone Call Date: 02/24/2017  Referring Provider: Alyssa Grove, MD  Reason for Visit: Evaluate for hereditary susceptibility to cancer    Assessment and Plan:  . Briana [name]'s history is somewhat suggestive of a hereditary predisposition to cancer given the young ages of cancer diagnosis, but there is not a very strong genetic connection between breast and colorectal cancers.   . Testing is recommended to determine whether she has a pathogenic mutation that will impact her screening and risk-reduction for cancer. A negative result will be reassuring, but only if it is clear what gene mutation is reportedly in her family.  . Briana [name] wished to pursue genetic testing and she will have her blood drawn at her next follow-up visit in clinic on 03/02/17. Analysis will include the 83 genes on [facility]'s Multi-Cancer panel (ALK, APC, ATM, AXIN2, BAP1, BARD1, BLM, BMPR1A, BRCA1, BRCA2, BRIP1, CASR, CDC73, CDH1, CDK4, CDKN1B, CDKN1C, CDKN2A, CEBPA, CHEK2, CTNNA1, DICER1, DIS3L2, EGFR, EPCAM, FH, FLCN, GATA2, GPC3, GREM1, HOXB13, HRAS, KIT, MAX, MEN1, MET, MITF, MLH1, MSH2, MSH3, MSH6, MUTYH, NBN, NF1, NF2, NTHL1, PALB2, PDGFRA, PHOX2B, PMS2, POLD1, POLE, POT1, PRKAR1A, PTCH1, PTEN, RAD50, RAD51C, RAD51D, RB1, RECQL4, RET, RUNX1, SDHA, SDHAF2, SDHB, SDHC, SDHD, SMAD4, SMARCA4, SMARCB1, SMARCE1, STK11, SUFU, TERC, TERT, TMEM127, TP53, TSC1, TSC2, VHL, WRN, WT1).  . Results should be available in approximately 2-3 weeks, at which point we will contact her and address implications for her as well as address genetic testing for at-risk family members, if needed.     Dr. Grayland Ormond was available for questions concerning this case. Total time spent by counseling by phone was approximately 25 minutes.    _____________________________________________________________________   History of Present Illness: Briana [name], a 52 y.o. female, was referred for genetic counseling to discuss the possibility of a hereditary predisposition to cancer and discuss whether genetic testing is warranted. This was a telegenetics visit via phone.  Briana [name] was recently diagnosed with breast cancer at the age of 26. She is s/p lumpectomy.   She indicated that she has a family history of cancer (see below) and that her sister and possibly a maternal cousin had genetic testing and a mutation was identified that is associated with colorectal cancer risk. She did not know the name of the gene involved.  Past Medical History:  Diagnosis Date  . Abnormal Pap smear of cervix    01/2015 ascus/neg- 04/2015 ascus/neg  . Allergy   . Anxiety   . Arthritis   . Asthma   . Chronic kidney disease   . COPD (chronic obstructive pulmonary disease) ([place])   . DDD (degenerative disc disease), lumbar   . Depression   . Elevated lipids   . Fibromyalgia   . Fibromyalgia   . GERD (gastroesophageal reflux disease)   . History of IBS   . Hyperthyroidism   . Joint disease   . Migraine   . Migraine   . Oxygen deficiency   . Restless leg syndrome   . Sleep apnea    Does not use C-PAP, cannot tolerate mask    Past Surgical History:  Procedure Laterality Date  . ABDOMINAL HYSTERECTOMY  2008  . BREAST BIOPSY Left 02/02/2017   [place] core path pending  . BREAST EXCISIONAL BIOPSY  Left 02/17/2017   lumpectomy with nl sn  . BREAST LUMPECTOMY WITH NEEDLE LOCALIZATION Left 02/17/2017   Procedure: BREAST LUMPECTOMY WITH NEEDLE LOCALIZATION;  Surgeon: Vickie Epley, MD;  Location: [facility];  Service: General;  Laterality: Left;  . CARPAL TUNNEL RELEASE Bilateral   . cryotherapy    . DILATION AND CURETTAGE OF UTERUS    . ENDOMETRIAL ABLATION    . LAPAROSCOPIC OOPHERECTOMY Left    unsure which side but thinks its the left  .  LYSIS OF ADHESION Left 10/14/2015   Procedure: LYSIS OF ADHESION;  Surgeon: Thornton Park, MD;  Location: [facility];  Service: Orthopedics;  Laterality: Left;  . NECK SURGERY     lower neck fusion rods and screws  . RESECTION DISTAL CLAVICAL Left 10/14/2015   Procedure: RESECTION DISTAL CLAVICAL;  Surgeon: Thornton Park, MD;  Location: [facility];  Service: Orthopedics;  Laterality: Left;  . SENTINEL NODE BIOPSY Left 02/17/2017   Procedure: SENTINEL NODE BIOPSY;  Surgeon: Vickie Epley, MD;  Location: [facility];  Service: General;  Laterality: Left;  . SHOULDER ARTHROSCOPY WITH OPEN ROTATOR CUFF REPAIR Left 10/14/2015   Procedure: SHOULDER ARTHROSCOPY WITH OPEN ROTATOR CUFF REPAIR;  Surgeon: Thornton Park, MD;  Location: [facility];  Service: Orthopedics;  Laterality: Left;  . SHOULDER ARTHROSCOPY WITH OPEN ROTATOR CUFF REPAIR AND DISTAL CLAVICLE ACROMINECTOMY Right 09/03/2014   Procedure: RIGHT SHOULDER ARTHROSCOPY WITH MINI OPEN ROTATOR CUFF TEAR;  Surgeon: Thornton Park, MD;  Location: [facility];  Service: Orthopedics;  Laterality: Right;  biceps tenodesis, arthroscopic subacromial decompression and distal clavicle incision  . spinal injections    . SUBACROMIAL DECOMPRESSION Left 10/14/2015   Procedure: SUBACROMIAL DECOMPRESSION;  Surgeon: Thornton Park, MD;  Location: [facility];  Service: Orthopedics;  Laterality: Left;    Family History: Significant diagnoses include the following:  Family History  Problem Relation Age of Onset  . Breast cancer Mother 75       Glioblastoma also; deceased at 3  . Diabetes Father   . Lung cancer Father        mesothelioma; deceased 21s  . Cancer Maternal Aunt        "bone ca"; unk. primary  . Colon cancer Maternal Grandfather        dx in 74s; deceased 39  . Colon cancer Maternal Aunt        dx in 77s; currently 22s  . Lung cancer Maternal Grandmother        smoker; deceased 49  . Lung cancer Paternal Grandmother        smoker; deceased 56s  .  Breast cancer Cousin        dx 28s; daughter of mat aunt with unk. primary cancer  . Cancer Other        distant cousin; unknown primary  . Colon cancer Other        dx 31s; currently 32; maternal half-sister    Additionally, Briana [name] had one son who was murdered at age 5. She has no full siblings. She has a paternal half-brother and half-sister, but does not have information about them. She also has a maternal half-brother and half-sister (noted above). Reportedly, her maternal half-sister had genetic testing and has a mutation, but Briana [name] did not know the name of the gene involved. Her mother (noted above) had a total of 3 sisters and a brother. Her father (noted above) had a brother and sister.  Briana [name] ancestry is Caucasian - NOS. There is no  known Jewish ancestry and no consanguinity.  Discussion: We reviewed the characteristics, features and inheritance patterns of hereditary cancer syndromes. We discussed her risk of harboring a mutation in the context of her personal and family history and explained that it would important to obtain more information about genetic testing that has already occurred in her family. We discussed the process of genetic testing, insurance coverage and implications of results: positive, negative and variant of unknown significance (VUS).   Briana [name] questions were answered to her satisfaction today and she is welcome to call with any additional questions or concerns. Thank you for the referral and allowing [place] to share in the care of your patient.    Steele Berg, MS, [place] Certified Genetic Counselor phone: [phone]

## 2017-02-26 NOTE — Progress Notes (Deleted)
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  INTERVAL HISTORY: Patient is a 52 year old female who noted an small mass in her left breast.  Subsequent mammogram, ultrasound, and biopsy revealed the above-stated breast cancer.  She is anxious, but otherwise feels well.  She has no neurologic complaints.  She denies any recent fevers or illnesses.  She has a good appetite and denies weight loss.  She has no chest pain or shortness of breath.  She denies any nausea, vomiting, constipation, or diarrhea.  She has no urinary complaints.  Patient otherwise feels well and offers no further specific complaints.  REVIEW OF SYSTEMS:   Review of Systems  Constitutional: Negative.  Negative for fever, malaise/fatigue and weight loss.  Respiratory: Negative.  Negative for cough and shortness of breath.   Cardiovascular: Negative.  Negative for chest pain and leg swelling.  Gastrointestinal: Negative.  Negative for abdominal pain.  Genitourinary: Negative.   Musculoskeletal: Negative.   Skin: Negative for rash.  Neurological: Negative.  Negative for sensory change and weakness.  Psychiatric/Behavioral: The patient is nervous/anxious.     As per HPI. Otherwise, a complete review of systems is negative.  PAST MEDICAL HISTORY: Past Medical History:  Diagnosis Date  . Abnormal Pap smear of cervix    01/2015 ascus/neg- 04/2015 ascus/neg  . Allergy   . Anxiety   . Arthritis   . Asthma   . Chronic kidney disease   . COPD (chronic obstructive pulmonary disease) ([place])   . DDD (degenerative disc disease), lumbar   . Depression   . Elevated lipids   . Fibromyalgia   . Fibromyalgia   . GERD  (gastroesophageal reflux disease)   . History of IBS   . Hyperthyroidism   . Joint disease   . Migraine   . Migraine   . Oxygen deficiency   . Restless leg syndrome   . Sleep apnea    Does not use C-PAP, cannot tolerate mask    PAST SURGICAL HISTORY: Past Surgical History:  Procedure Laterality Date  . ABDOMINAL HYSTERECTOMY  2008  . BREAST BIOPSY Left 02/02/2017   [place] core path pending  . BREAST EXCISIONAL BIOPSY Left 02/17/2017   lumpectomy with nl sn  . BREAST LUMPECTOMY WITH NEEDLE LOCALIZATION Left 02/17/2017   Procedure: BREAST LUMPECTOMY WITH NEEDLE LOCALIZATION;  Surgeon: Vickie Epley, MD;  Location: [facility];  Service: General;  Laterality: Left;  . CARPAL TUNNEL RELEASE Bilateral   . cryotherapy    . DILATION AND CURETTAGE OF UTERUS    . ENDOMETRIAL ABLATION    . LAPAROSCOPIC OOPHERECTOMY Left    unsure which side but thinks its the left  . LYSIS OF ADHESION Left 10/14/2015   Procedure: LYSIS OF ADHESION;  Surgeon: Thornton Park, MD;  Location: [facility];  Service: Orthopedics;  Laterality: Left;  . NECK SURGERY     lower neck fusion rods and screws  . RESECTION DISTAL CLAVICAL Left 10/14/2015   Procedure: RESECTION DISTAL CLAVICAL;  Surgeon: Thornton Park, MD;  Location: [facility];  Service: Orthopedics;  Laterality: Left;  . SENTINEL NODE BIOPSY Left 02/17/2017   Procedure: SENTINEL NODE BIOPSY;  Surgeon: Vickie Epley, MD;  Location: [facility];  Service: General;  Laterality: Left;  . SHOULDER ARTHROSCOPY WITH OPEN ROTATOR CUFF REPAIR Left 10/14/2015   Procedure: SHOULDER ARTHROSCOPY WITH OPEN ROTATOR CUFF REPAIR;  Surgeon: Thornton Park, MD;  Location: [facility];  Service: Orthopedics;  Laterality: Left;  . SHOULDER ARTHROSCOPY WITH OPEN ROTATOR CUFF REPAIR AND DISTAL CLAVICLE ACROMINECTOMY Right 09/03/2014   Procedure: RIGHT SHOULDER ARTHROSCOPY WITH MINI OPEN ROTATOR CUFF TEAR;  Surgeon: Thornton Park, MD;  Location: [facility];  Service: Orthopedics;   Laterality: Right;  biceps tenodesis, arthroscopic subacromial decompression and distal clavicle incision  . spinal injections    . SUBACROMIAL DECOMPRESSION Left 10/14/2015   Procedure: SUBACROMIAL DECOMPRESSION;  Surgeon: Thornton Park, MD;  Location: [facility];  Service: Orthopedics;  Laterality: Left;    FAMILY HISTORY: Family History  Problem Relation Age of Onset  . Breast cancer Mother 17       Glioblastoma also; deceased at 59  . Diabetes Father   . Lung cancer Father        mesothelioma; deceased 39s  . Cancer Maternal Aunt        "bone ca"; unk. primary  . Colon cancer Maternal Grandfather        dx in 64s; deceased 51  . Colon cancer Maternal Aunt        dx in 48s; currently 51s  . Lung cancer Maternal Grandmother        smoker; deceased 10  . Lung cancer Paternal Grandmother        smoker; deceased 40s  . Breast cancer Cousin        dx 21s; daughter of mat aunt with unk. primary cancer  . Cancer Other        distant cousin; unknown primary  . Colon cancer Other        dx 97s; currently 72; maternal half-sister    ADVANCED DIRECTIVES (Y/N):  N  HEALTH MAINTENANCE: Social History   Tobacco Use  . Smoking status: Current Every Day Smoker    Packs/day: 1.00    Types: Cigarettes  . Smokeless tobacco: Never Used  Substance Use Topics  . Alcohol use: No    Alcohol/week: 0.0 oz  . Drug use: No     Colonoscopy:  PAP:  Bone density:  Lipid panel:  Allergies  Allergen Reactions  . Gadolinium Derivatives Swelling and Other (See Comments)    NECK BECAME RED AND TONGUE WAS SWELLING SLIGHTLY PER PT NECK BECAME RED AND TONGUE WAS SWELLING SLIGHTLY PER PT  . Iodine   . Bee Venom Hives and Rash  . Cefoxitin Rash  . Cefuroxime Axetil Hives and Rash  . Cucumber Extract Rash  . Iodinated Diagnostic Agents Hives and Rash  . Latex Rash  . Other Hives and Rash    Bee Stings-swelling/hives/rash Wool (textile fiber)-Rash/itching  . Tomato Rash    Red tomatoes     Current Outpatient Medications  Medication Sig Dispense Refill  . albuterol (ACCUNEB) 0.63 MG/3ML nebulizer solution Take 1 ampule by nebulization every 6 (six) hours as needed for wheezing.    . baclofen (LIORESAL) 10 MG tablet Take 10 mg by mouth 3 (three) times daily as needed for muscle spasms (typically 2-3 times daily).     . cholecalciferol (VITAMIN D) 1000 UNITS tablet Take 1,000 Units by mouth 2 (two) times daily.     . diclofenac sodium (VOLTAREN) 1 % GEL Apply 2 g topically 4 (four) times daily as needed (for pain.).     Marland Kitchen EPINEPHrine (EPIPEN JR) 0.15 MG/0.3ML injection Inject  0.15 mg into the muscle as needed for anaphylaxis.    Marland Kitchen estrogen, conjugated,-medroxyprogesterone (PREMPRO) 0.3-1.5 MG per tablet Take 1 tablet by mouth every other day.     . hydrocortisone 2.5 % cream Apply 1 application topically 2 (two) times daily as needed (for itchy/dry skin.).    Marland Kitchen linaclotide (LINZESS) 145 MCG CAPS capsule Take 145 mcg by mouth daily as needed (for constipation.).    Marland Kitchen LORazepam (ATIVAN) 0.5 MG tablet Take 0.5 mg by mouth 3 (three) times daily as needed for anxiety.     . magnesium oxide (MAG-OX) 400 MG tablet Take 400 mg by mouth daily. Reported on 07/08/2015    . Multiple Vitamins-Minerals (HAIR SKIN AND NAILS FORMULA) TABS Take 2 tablets by mouth daily.     Marland Kitchen omeprazole (PRILOSEC) 20 MG capsule Take 20 mg by mouth daily before breakfast.     . oxyCODONE (ROXICODONE) 5 MG immediate release tablet Take 1 tablet (5 mg total) by mouth every 4 (four) hours as needed for severe pain. 20 tablet 0  . oxyCODONE 10 MG TABS Take 1 tablet (10 mg total) by mouth every 4 (four) hours as needed for breakthrough pain. 60 tablet 0  . promethazine (PHENERGAN) 25 MG tablet Take 25 mg by mouth every 6 (six) hours as needed for nausea or vomiting.    . rosuvastatin (CRESTOR) 40 MG tablet Take 40 mg by mouth at bedtime.     . topiramate (TOPAMAX) 100 MG tablet Take 200 mg by mouth daily.     . vitamin E  (VITAMIN E) 200 UNIT capsule Take 200 Units by mouth 4 (four) times a week. Take 4-5 times a week     No current facility-administered medications for this visit.     OBJECTIVE: There were no vitals filed for this visit.   There is no height or weight on file to calculate BMI.    ECOG FS:0 - Asymptomatic  General: Well-developed, well-nourished, no acute distress. Eyes: Pink conjunctiva, anicteric sclera. HEENT: Normocephalic, moist mucous membranes, clear oropharnyx. Breast: Patient requested exam be deferred today. Lungs: Clear to auscultation bilaterally. Heart: Regular rate and rhythm. No rubs, murmurs, or gallops. Abdomen: Soft, nontender, nondistended. No organomegaly noted, normoactive bowel sounds. Musculoskeletal: No edema, cyanosis, or clubbing. Neuro: Alert, answering all questions appropriately. Cranial nerves grossly intact. Skin: No rashes or petechiae noted. Psych: Normal affect. Lymphatics: No cervical, calvicular, axillary or inguinal LAD.   LAB RESULTS:  Lab Results  Component Value Date   NA 140 02/14/2017   K 3.7 02/14/2017   CL 109 02/14/2017   CO2 20 (L) 02/14/2017   GLUCOSE 87 02/14/2017   BUN 21 (H) 02/14/2017   CREATININE 0.90 02/14/2017   CALCIUM 9.5 02/14/2017   GFRNONAA >60 02/14/2017   GFRAA >60 02/14/2017    Lab Results  Component Value Date   WBC 7.3 02/14/2017   NEUTROABS 4.6 02/14/2017   HGB 14.6 02/14/2017   HCT 42.7 02/14/2017   MCV 99.4 02/14/2017   PLT 254 02/14/2017     STUDIES: Chest 2 View  Result Date: 02/15/2017 CLINICAL DATA:  Preop chest radiograph. EXAM: CHEST  2 VIEW COMPARISON:  None FINDINGS: The heart size and mediastinal contours are within normal limits. Chronic interstitial coarsening is identified throughout both lungs. No superimposed airspace consolidation. No pulmonary edema or pleural effusion. The visualized skeletal structures are unremarkable. IMPRESSION: 1. Chronic interstitial coarsening likely  reflecting smoking related changes. 2. No acute cardiopulmonary abnormality. Electronically Signed   By:  Kerby Moors M.D.   On: 02/15/2017 08:27   Nm Sentinel Node Injection  Result Date: 02/17/2017 CLINICAL DATA:  Left breast cancer. EXAM: NUCLEAR MEDICINE BREAST LYMPHOSCINTIGRAPHY TECHNIQUE: Intradermal injection of radiopharmaceutical was performed at the 12 o'clock, 3 o'clock, 6 o'clock, and 9 o'clock positions around the left nipple. The patient was then sent to the operating room where the sentinel node(s) were identified and removed by the surgeon. RADIOPHARMACEUTICALS:  Total of 1 mCi Millipore-filtered Technetium-7msulfur colloid, injected in four aliquots of 0.25 mCi each. IMPRESSION: Uncomplicated intradermal injection of a total of 1 mCi Technetium-96mulfur colloid for purposes of sentinel node identification. Electronically Signed   By: JoSandi Mariscal.D.   On: 02/17/2017 09:56   Mm Breast Surgical Specimen  Result Date: 02/17/2017 CLINICAL DATA:  Status post left lumpectomy for left breast cancer. EXAM: SPECIMEN RADIOGRAPH OF THE LEFT BREAST COMPARISON:  Previous exam(s). FINDINGS: Status post excision of the left breast. The wire tips and biopsy marker clip are present and are marked for pathology. IMPRESSION: Specimen radiograph of the left breast. Electronically Signed   By: WeAbelardo Diesel.D.   On: 02/17/2017 12:36   Mm Clip Placement Left  Result Date: 02/02/2017 CLINICAL DATA:  Post ultrasound-guided core needle biopsy of left breast 1 o'clock mass. EXAM: DIAGNOSTIC LEFT MAMMOGRAM POST ULTRASOUND BIOPSY COMPARISON:  Previous exam(s). FINDINGS: Mammographic images were obtained following ultra guided biopsy of left breast 1 o'clock mass. Two-view mammography demonstrates presence of ribbon shaped marker within the biopsy site, in appropriate mammographic position. IMPRESSION: Successful placement of ribbon shaped marker post ultrasound-guided core needle biopsy of left breast 1  o'clock mass. Final Assessment: Post Procedure Mammograms for Marker Placement Electronically Signed   By: DoFidela Salisbury.D.   On: 02/02/2017 13:33   UsKoreat Breast Bx W Loc Dev 1st Lesion Img Bx Spec UsKoreauide  Addendum Date: 02/09/2017   ADDENDUM REPORT: 02/07/2017 09:02 ADDENDUM: Pathology of the left breast biopsy revealed A. LEFT BREAST, 1:00; ULTRASOUND-GUIDED CORE BIOPSY: INVASIVE MAMMARY CARCINOMA, NO SPECIAL TYPE. Size of invasive carcinoma: 0.6 cm in this sample. Histologic grade of invasive carcinoma: Grade 1. ER/PR/HER2: Immunohistochemistry will be performed with reflex to FIReynoldsor HER2 2+ (equivocal) results. The results will be reported in an addendum. The diagnosis was called to DeEino Farberf [facility] 02/03/17 at 12:05 PM. This was found to be concordant by Dr. DiKateri Plummermpression and notes. Recommendation: Surgical and oncology referrals. Results and recommendations were relayed to ChEvern BioNP's nurse by JoJetta LoutRRRichland Centern 02/03/17. She requested that the radiologist contact the patient with results and the nurse navigators arrange referrals. At the patient's request, results and recommendations were relayed to the patient by phone by Dr. CoTheda Sersn 02/03/17. The patient stated she did well following the biopsy. All of her questions were answered. Request for referrals was relayed to ShTanya NonesRN, nurse navigator for [facility], referrals were made to Dr. JaTama Highor 02/10/17 at 11:00 AM and Dr. FiGrayland Ormondoncologist for 02/07/17 at 1:30 PM. The patient has been notified of the appointments. She was encouraged to contact ShTanya NonesRN or the [facility] any further questions or concerns. Addendum by JoJetta LoutRRA on 02/07/17. Electronically Signed   By: DoFidela Salisbury.D.   On: 02/07/2017 09:02   Result Date: 02/07/2017 CLINICAL DATA:  Left breast 1 o'clock suspicious  mass. EXAM: ULTRASOUND GUIDED LEFT BREAST  CORE NEEDLE BIOPSY COMPARISON:  Previous exam(s). FINDINGS: I met with the patient and we discussed the procedure of ultrasound-guided biopsy, including benefits and alternatives. We discussed the high likelihood of a successful procedure. We discussed the risks of the procedure, including infection, bleeding, tissue injury, clip migration, and inadequate sampling. Informed written consent was given. The usual time-out protocol was performed immediately prior to the procedure. Lesion quadrant: Upper outer quadrant. Using sterile technique and 1% Lidocaine as local anesthetic, under direct ultrasound visualization, a 14 gauge spring-loaded device was used to perform biopsy of left breast 1 o'clock mass using a inferior approach. At the conclusion of the procedure a ribbon shaped tissue marker clip was deployed into the biopsy cavity. Follow up 2 view mammogram was performed and dictated separately. IMPRESSION: Ultrasound guided biopsy of left breast 1 o'clock mass. No apparent complications. Electronically Signed: By: Fidela Salisbury M.D. On: 02/02/2017 13:25   Mm Lt Plc Breast Loc Dev   1st Lesion  Inc Mammo Guide  Result Date: 02/17/2017 CLINICAL DATA:  Left breast cancer for needle localization prior to surgery. EXAM: NEEDLE LOCALIZATION OF THE LEFT BREAST WITH MAMMO GUIDANCE COMPARISON:  Previous exams. FINDINGS: Patient presents for needle localization prior to left breast surgery. I met with the patient and we discussed the procedure of needle localization including benefits and alternatives. We discussed the high likelihood of a successful procedure. We discussed the risks of the procedure, including infection, bleeding, tissue injury, and further surgery. Informed, written consent was given. The usual time-out protocol was performed immediately prior to the procedure. Using mammographic guidance, sterile technique, 1% lidocaine and a 5 cm modified Kopans  needle, ribbon biopsy clip localized using cranial approach. The images were marked for Dr. Shanon Brow. The first wire that was put in was deeper than expected. Therefore, a second wire shorter wire was put in via the same direction. IMPRESSION: Needle localization left breast. No apparent complications. Electronically Signed   By: Abelardo Diesel M.D.   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  PLAN:    1. 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  Given the HER-2 positivity, she also may benefit from adjuvant chemotherapy as well as extended Herceptin maintenance.  If she has a lumpectomy she will also need XRT.  Finally, because patient has an ER/PR positive malignancy, she will benefit from an aromatase inhibitor for 5 years at the conclusion of all her treatments.  Patient will follow-up 1-2 weeks after her surgery to discuss her final pathology results and additional treatment planning. 2.  Genetic testing: Given patient's young age and family history of breast cancer in her mother, can consider genetic testing in the near future.  Approximately 60 minutes was spent in discussion of which greater than 50% was consultation.  Patient expressed understanding and was in agreement with this plan. She also understands that She can call clinic at any time with any questions, concerns, or complaints.   Cancer Staging Primary cancer of upper outer quadrant of left female breast [facility]) Staging form: Breast, AJCC 8th Edition - Clinical stage from 02/07/2017: Stage IA (cT1c, cN0, cM0, G1, ER: Positive, PR: Positive, HER2: Positive) - Signed  by Lloyd Huger, MD on 02/11/2017   Lloyd Huger, MD   02/26/2017 9:02 AM

## 2017-02-27 ENCOUNTER — Telehealth: Payer: Self-pay

## 2017-02-27 ENCOUNTER — Encounter: Payer: Self-pay | Admitting: Surgery

## 2017-02-27 NOTE — Telephone Encounter (Signed)
Patient called stating that her [facility] pharmacist had told her that she needed to speak to [place] in reference to her Oxycodone 5 MG that Dr. Rosana Hoes had prescribed her.  I told patient that I would call [facility] and ask to speak with the pharmacist. Then I would call her.  Called and spoke with the pharmacist and she stated that the patient wanted her Oxycodone 5 MG to be filled. However, patient is under contract with another physician is currently giving her Oxycodone 10 MG, therefore, she does not feel comfortable filling Dr. Shann Medal prescription. Pharmacist also told me that she received 150 tablets on 02/06/2017. I told her that Dr. Rosana Hoes did not know that the patient was under contract with another physician. So, I told the pharmacist that per Dr. Rosana Hoes, it was okay for her not to fill her prescription. I told the pharmacist that I would call the patient to let her know. Pharmacist agreed.  Called patient back and explained why her prescription was not filled. I told her that she could take Tylenol (Acetaminophen) 1000 MG every 6 hours if needed for pain. Patient agreed and had not further questions.

## 2017-02-28 ENCOUNTER — Encounter: Payer: Self-pay | Admitting: *Deleted

## 2017-02-28 ENCOUNTER — Inpatient Hospital Stay: Payer: Medicare Other

## 2017-02-28 ENCOUNTER — Inpatient Hospital Stay (HOSPITAL_BASED_OUTPATIENT_CLINIC_OR_DEPARTMENT_OTHER): Payer: Medicare Other | Admitting: Oncology

## 2017-02-28 DIAGNOSIS — F17210 Nicotine dependence, cigarettes, uncomplicated: Secondary | ICD-10-CM | POA: Diagnosis not present

## 2017-02-28 DIAGNOSIS — Z170 Estrogen receptor positive status [ER+]: Secondary | ICD-10-CM

## 2017-02-28 DIAGNOSIS — C50919 Malignant neoplasm of unspecified site of unspecified female breast: Secondary | ICD-10-CM

## 2017-02-28 DIAGNOSIS — C50412 Malignant neoplasm of upper-outer quadrant of left female breast: Secondary | ICD-10-CM | POA: Diagnosis not present

## 2017-02-28 MED ORDER — LIDOCAINE-PRILOCAINE 2.5-2.5 % EX CREA: TOPICAL_CREAM | CUTANEOUS | 3 refills | Status: DC

## 2017-02-28 MED ORDER — PROCHLORPERAZINE MALEATE 10 MG PO TABS: 10.0000 mg | ORAL_TABLET | Freq: Four times a day (QID) | ORAL | 2 refills | Status: DC | PRN

## 2017-02-28 MED ORDER — ONDANSETRON HCL 8 MG PO TABS: 8.0000 mg | ORAL_TABLET | Freq: Two times a day (BID) | ORAL | 2 refills | Status: DC | PRN

## 2017-02-28 NOTE — Progress Notes (Signed)
  Oncology Nurse Navigator Documentation  Navigator Location: [facility] (02/28/17 1600)   )Navigator Encounter Type: Clinic/MDC (02/28/17 1600)                       Treatment Phase: Pre-Tx/Tx Discussion (02/28/17 1600)                            Time Spent with Patient: 75 (02/28/17 1600)   Met patient during her post surgery follow-up with Dr. Grayland Ormond.  She is going to need chemotherapy since her tumor is Her2 positive.  She is very tearful.  Offered support.  States she has no support at home.  Encouraged support group and other resources here at the cancer center.  She does not drive at night.  She was encouraged to call for any needs or questions.

## 2017-02-28 NOTE — Progress Notes (Signed)
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  INTERVAL HISTORY: Patient returns to clinic today to discuss her final pathology results and treatment planning.  She tolerated her lumpectomy well without significant side effects.  She is anxious, but otherwise feels well.  She has no neurologic complaints.  She denies any recent fevers or illnesses.  She has a good appetite and denies weight loss.  She has no chest pain or shortness of breath.  She denies any nausea, vomiting, constipation, or diarrhea.  She has no urinary complaints.  Patient otherwise feels well and offers no further specific complaints.  REVIEW OF SYSTEMS:   Review of Systems  Constitutional: Negative.  Negative for fever, malaise/fatigue and weight loss.  Respiratory: Negative.  Negative for cough and shortness of breath.   Cardiovascular: Negative.  Negative for chest pain and leg swelling.  Gastrointestinal: Negative.  Negative for abdominal pain.  Genitourinary: Negative.   Musculoskeletal: Negative.   Skin: Negative for rash.  Neurological: Negative.  Negative for sensory change and weakness.  Psychiatric/Behavioral: The patient is nervous/anxious.     As per HPI. Otherwise, a complete review of systems is negative.  PAST MEDICAL HISTORY: Past Medical History:  Diagnosis Date  . Abnormal Pap smear of cervix    01/2015 ascus/neg- 04/2015 ascus/neg  . Allergy   . Anxiety   . Arthritis   . Asthma   . Chronic kidney disease   . COPD (chronic obstructive pulmonary disease) ([place])   . DDD (degenerative disc disease), lumbar   . Depression   . Elevated lipids   . Fibromyalgia   . Fibromyalgia   .  GERD (gastroesophageal reflux disease)   . History of IBS   . Hyperthyroidism   . Joint disease   . Migraine   . Migraine   . Oxygen deficiency   . Restless leg syndrome   . Sleep apnea    Does not use C-PAP, cannot tolerate mask    PAST SURGICAL HISTORY: Past Surgical History:  Procedure Laterality Date  . ABDOMINAL HYSTERECTOMY  2008  . BREAST BIOPSY Left 02/02/2017   [place] core path pending  . BREAST EXCISIONAL BIOPSY Left 02/17/2017   lumpectomy with nl sn  . BREAST LUMPECTOMY WITH NEEDLE LOCALIZATION Left 02/17/2017   Procedure: BREAST LUMPECTOMY WITH NEEDLE LOCALIZATION;  Surgeon: Vickie Epley, MD;  Location: [facility];  Service: General;  Laterality: Left;  . CARPAL TUNNEL RELEASE Bilateral   . cryotherapy    . DILATION AND CURETTAGE OF UTERUS    . ENDOMETRIAL ABLATION    . LAPAROSCOPIC OOPHERECTOMY Left    unsure which side but thinks its the left  . LYSIS OF ADHESION Left 10/14/2015   Procedure: LYSIS OF ADHESION;  Surgeon: Thornton Park, MD;  Location: [facility];  Service: Orthopedics;  Laterality: Left;  . NECK SURGERY     lower neck fusion rods and screws  . RESECTION DISTAL CLAVICAL Left 10/14/2015   Procedure: RESECTION DISTAL CLAVICAL;  Surgeon: Thornton Park, MD;  Location: [facility];  Service: Orthopedics;  Laterality: Left;  . SENTINEL NODE BIOPSY Left 02/17/2017   Procedure: SENTINEL NODE BIOPSY;  Surgeon: Vickie Epley, MD;  Location: [facility];  Service: General;  Laterality:  Left;  . SHOULDER ARTHROSCOPY WITH OPEN ROTATOR CUFF REPAIR Left 10/14/2015   Procedure: SHOULDER ARTHROSCOPY WITH OPEN ROTATOR CUFF REPAIR;  Surgeon: Thornton Park, MD;  Location: [facility];  Service: Orthopedics;  Laterality: Left;  . SHOULDER ARTHROSCOPY WITH OPEN ROTATOR CUFF REPAIR AND DISTAL CLAVICLE ACROMINECTOMY Right 09/03/2014   Procedure: RIGHT SHOULDER ARTHROSCOPY WITH MINI OPEN ROTATOR CUFF TEAR;  Surgeon: Thornton Park, MD;  Location: [facility];  Service: Orthopedics;   Laterality: Right;  biceps tenodesis, arthroscopic subacromial decompression and distal clavicle incision  . spinal injections    . SUBACROMIAL DECOMPRESSION Left 10/14/2015   Procedure: SUBACROMIAL DECOMPRESSION;  Surgeon: Thornton Park, MD;  Location: [facility];  Service: Orthopedics;  Laterality: Left;    FAMILY HISTORY: Family History  Problem Relation Age of Onset  . Breast cancer Mother 76       Glioblastoma also; deceased at 63  . Diabetes Father   . Lung cancer Father        mesothelioma; deceased 74s  . Cancer Maternal Aunt        "bone ca"; unk. primary  . Colon cancer Maternal Grandfather        dx in 2s; deceased 54  . Colon cancer Maternal Aunt        dx in 55s; currently 53s  . Lung cancer Maternal Grandmother        smoker; deceased 8  . Lung cancer Paternal Grandmother        smoker; deceased 60s  . Breast cancer Cousin        dx 62s; daughter of mat aunt with unk. primary cancer  . Cancer Other        distant cousin; unknown primary  . Colon cancer Other        dx 75s; currently 44; maternal half-sister    ADVANCED DIRECTIVES (Y/N):  N  HEALTH MAINTENANCE: Social History   Tobacco Use  . Smoking status: Current Every Day Smoker    Packs/day: 1.00    Types: Cigarettes  . Smokeless tobacco: Never Used  Substance Use Topics  . Alcohol use: No    Alcohol/week: 0.0 oz  . Drug use: No     Colonoscopy:  PAP:  Bone density:  Lipid panel:  Allergies  Allergen Reactions  . Gadolinium Derivatives Swelling and Other (See Comments)    NECK BECAME RED AND TONGUE WAS SWELLING SLIGHTLY PER PT NECK BECAME RED AND TONGUE WAS SWELLING SLIGHTLY PER PT  . Iodine   . Bee Venom Hives and Rash  . Cefoxitin Rash  . Cefuroxime Axetil Hives and Rash  . Cucumber Extract Rash  . Iodinated Diagnostic Agents Hives and Rash  . Latex Rash  . Other Hives and Rash    Bee Stings-swelling/hives/rash Wool (textile fiber)-Rash/itching  . Tomato Rash    Red tomatoes     Current Outpatient Medications  Medication Sig Dispense Refill  . albuterol (ACCUNEB) 0.63 MG/3ML nebulizer solution Take 1 ampule by nebulization every 6 (six) hours as needed for wheezing.    . baclofen (LIORESAL) 10 MG tablet Take 10 mg by mouth 3 (three) times daily as needed for muscle spasms (typically 2-3 times daily).     . cholecalciferol (VITAMIN D) 1000 UNITS tablet Take 1,000 Units by mouth 2 (two) times daily.     . diclofenac sodium (VOLTAREN) 1 % GEL Apply 2 g topically 4 (four) times daily as needed (for pain.).     Marland Kitchen EPINEPHrine (EPIPEN JR) 0.15 MG/0.3ML injection Inject 0.15  mg into the muscle as needed for anaphylaxis.    Marland Kitchen estrogen, conjugated,-medroxyprogesterone (PREMPRO) 0.3-1.5 MG per tablet Take 1 tablet by mouth every other day.     . hydrocortisone 2.5 % cream Apply 1 application topically 2 (two) times daily as needed (for itchy/dry skin.).    Marland Kitchen linaclotide (LINZESS) 145 MCG CAPS capsule Take 145 mcg by mouth daily as needed (for constipation.).    Marland Kitchen LORazepam (ATIVAN) 0.5 MG tablet Take 0.5 mg by mouth 3 (three) times daily as needed for anxiety.     . magnesium oxide (MAG-OX) 400 MG tablet Take 400 mg by mouth daily. Reported on 07/08/2015    . Multiple Vitamins-Minerals (HAIR SKIN AND NAILS FORMULA) TABS Take 2 tablets by mouth daily.     Marland Kitchen omeprazole (PRILOSEC) 20 MG capsule Take 20 mg by mouth daily before breakfast.     . oxyCODONE (ROXICODONE) 5 MG immediate release tablet Take 1 tablet (5 mg total) by mouth every 4 (four) hours as needed for severe pain. 20 tablet 0  . oxyCODONE 10 MG TABS Take 1 tablet (10 mg total) by mouth every 4 (four) hours as needed for breakthrough pain. 60 tablet 0  . promethazine (PHENERGAN) 25 MG tablet Take 25 mg by mouth every 6 (six) hours as needed for nausea or vomiting.    . rosuvastatin (CRESTOR) 40 MG tablet Take 40 mg by mouth at bedtime.     . topiramate (TOPAMAX) 100 MG tablet Take 200 mg by mouth daily.     . vitamin E  (VITAMIN E) 200 UNIT capsule Take 200 Units by mouth 4 (four) times a week. Take 4-5 times a week     No current facility-administered medications for this visit.     OBJECTIVE: There were no vitals filed for this visit.   There is no height or weight on file to calculate BMI.    ECOG FS:0 - Asymptomatic  General: Well-developed, well-nourished, no acute distress. Eyes: Pink conjunctiva, anicteric sclera. Breast: Well-healed surgical scar left breast. Lungs: Clear to auscultation bilaterally. Heart: Regular rate and rhythm. No rubs, murmurs, or gallops. Abdomen: Soft, nontender, nondistended. No organomegaly noted, normoactive bowel sounds. Musculoskeletal: No edema, cyanosis, or clubbing. Neuro: Alert, answering all questions appropriately. Cranial nerves grossly intact. Skin: No rashes or petechiae noted. Psych: Normal affect.   LAB RESULTS:  Lab Results  Component Value Date   NA 140 02/14/2017   K 3.7 02/14/2017   CL 109 02/14/2017   CO2 20 (L) 02/14/2017   GLUCOSE 87 02/14/2017   BUN 21 (H) 02/14/2017   CREATININE 0.90 02/14/2017   CALCIUM 9.5 02/14/2017   GFRNONAA >60 02/14/2017   GFRAA >60 02/14/2017    Lab Results  Component Value Date   WBC 7.3 02/14/2017   NEUTROABS 4.6 02/14/2017   HGB 14.6 02/14/2017   HCT 42.7 02/14/2017   MCV 99.4 02/14/2017   PLT 254 02/14/2017     STUDIES: Chest 2 View  Result Date: 02/15/2017 CLINICAL DATA:  Preop chest radiograph. EXAM: CHEST  2 VIEW COMPARISON:  None FINDINGS: The heart size and mediastinal contours are within normal limits. Chronic interstitial coarsening is identified throughout both lungs. No superimposed airspace consolidation. No pulmonary edema or pleural effusion. The visualized skeletal structures are unremarkable. IMPRESSION: 1. Chronic interstitial coarsening likely reflecting smoking related changes. 2. No acute cardiopulmonary abnormality. Electronically Signed   By: Kerby Moors M.D.   On: 02/15/2017  08:27   Nm Sentinel Node Injection  Result  Date: 02/17/2017 CLINICAL DATA:  Left breast cancer. EXAM: NUCLEAR MEDICINE BREAST LYMPHOSCINTIGRAPHY TECHNIQUE: Intradermal injection of radiopharmaceutical was performed at the 12 o'clock, 3 o'clock, 6 o'clock, and 9 o'clock positions around the left nipple. The patient was then sent to the operating room where the sentinel node(s) were identified and removed by the surgeon. RADIOPHARMACEUTICALS:  Total of 1 mCi Millipore-filtered Technetium-31msulfur colloid, injected in four aliquots of 0.25 mCi each. IMPRESSION: Uncomplicated intradermal injection of a total of 1 mCi Technetium-961mulfur colloid for purposes of sentinel node identification. Electronically Signed   By: JoSandi Mariscal.D.   On: 02/17/2017 09:56   Mm Breast Surgical Specimen  Result Date: 02/17/2017 CLINICAL DATA:  Status post left lumpectomy for left breast cancer. EXAM: SPECIMEN RADIOGRAPH OF THE LEFT BREAST COMPARISON:  Previous exam(s). FINDINGS: Status post excision of the left breast. The wire tips and biopsy marker clip are present and are marked for pathology. IMPRESSION: Specimen radiograph of the left breast. Electronically Signed   By: WeAbelardo Diesel.D.   On: 02/17/2017 12:36   Mm Clip Placement Left  Result Date: 02/02/2017 CLINICAL DATA:  Post ultrasound-guided core needle biopsy of left breast 1 o'clock mass. EXAM: DIAGNOSTIC LEFT MAMMOGRAM POST ULTRASOUND BIOPSY COMPARISON:  Previous exam(s). FINDINGS: Mammographic images were obtained following ultra guided biopsy of left breast 1 o'clock mass. Two-view mammography demonstrates presence of ribbon shaped marker within the biopsy site, in appropriate mammographic position. IMPRESSION: Successful placement of ribbon shaped marker post ultrasound-guided core needle biopsy of left breast 1 o'clock mass. Final Assessment: Post Procedure Mammograms for Marker Placement Electronically Signed   By: DoFidela Salisbury.D.   On:  02/02/2017 13:33   UsKoreat Breast Bx W Loc Dev 1st Lesion Img Bx Spec UsKoreauide  Addendum Date: 02/09/2017   ADDENDUM REPORT: 02/07/2017 09:02 ADDENDUM: Pathology of the left breast biopsy revealed A. LEFT BREAST, 1:00; ULTRASOUND-GUIDED CORE BIOPSY: INVASIVE MAMMARY CARCINOMA, NO SPECIAL TYPE. Size of invasive carcinoma: 0.6 cm in this sample. Histologic grade of invasive carcinoma: Grade 1. ER/PR/HER2: Immunohistochemistry will be performed with reflex to FIHudsonor HER2 2+ (equivocal) results. The results will be reported in an addendum. The diagnosis was [facility] 02/03/17 at 12:05 PM. This was found to be concordant by Dr. DiKateri Plummermpression and notes. Recommendation: Surgical and oncology referrals. Results and recommendations were relayed to ChEvern BioNP's nurse by JoJetta LoutRRPlainn 02/03/17. She requested that the radiologist contact the patient with results and the nurse navigators arrange referrals. At the patient's request, results and recommendations were relayed to the patient by phone by Dr. CoTheda Sersn 02/03/17. The patient stated she did well following the biopsy. All of her questions were answered. Request for referrals was relayed to ShTanya NonesRN, nurse navigator for [facility], referrals were made to Dr. JaTama Highor 02/10/17 at 11:00 AM and Dr. FiGrayland Ormondoncologist for 02/07/17 at 1:30 PM. The patient has been notified of the appointments. She was encouraged to contact ShTanya NonesRN or the [facility] any further questions or concerns. Addendum by JoJetta LoutRRA on 02/07/17. Electronically Signed   By: DoFidela Salisbury.D.   On: 02/07/2017 09:02   Result Date: 02/07/2017 CLINICAL DATA:  Left breast 1 o'clock suspicious mass. EXAM: ULTRASOUND GUIDED LEFT BREAST CORE NEEDLE BIOPSY COMPARISON:  Previous exam(s). FINDINGS: I met with the patient and we  discussed the  procedure of ultrasound-guided biopsy, including benefits and alternatives. We discussed the high likelihood of a successful procedure. We discussed the risks of the procedure, including infection, bleeding, tissue injury, clip migration, and inadequate sampling. Informed written consent was given. The usual time-out protocol was performed immediately prior to the procedure. Lesion quadrant: Upper outer quadrant. Using sterile technique and 1% Lidocaine as local anesthetic, under direct ultrasound visualization, a 14 gauge spring-loaded device was used to perform biopsy of left breast 1 o'clock mass using a inferior approach. At the conclusion of the procedure a ribbon shaped tissue marker clip was deployed into the biopsy cavity. Follow up 2 view mammogram was performed and dictated separately. IMPRESSION: Ultrasound guided biopsy of left breast 1 o'clock mass. No apparent complications. Electronically Signed: By: Fidela Salisbury M.D. On: 02/02/2017 13:25   Mm Lt Plc Breast Loc Dev   1st Lesion  Inc Mammo Guide  Result Date: 02/17/2017 CLINICAL DATA:  Left breast cancer for needle localization prior to surgery. EXAM: NEEDLE LOCALIZATION OF THE LEFT BREAST WITH MAMMO GUIDANCE COMPARISON:  Previous exams. FINDINGS: Patient presents for needle localization prior to left breast surgery. I met with the patient and we discussed the procedure of needle localization including benefits and alternatives. We discussed the high likelihood of a successful procedure. We discussed the risks of the procedure, including infection, bleeding, tissue injury, and further surgery. Informed, written consent was given. The usual time-out protocol was performed immediately prior to the procedure. Using mammographic guidance, sterile technique, 1% lidocaine and a 5 cm modified Kopans needle, ribbon biopsy clip localized using cranial approach. The images were marked for Dr. Shanon Brow. The first wire that was put in was  deeper than expected. Therefore, a second wire shorter wire was put in via the same direction. IMPRESSION: Needle localization left breast. No apparent complications. Electronically Signed   By: Abelardo Diesel M.D.   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  PLAN:    1.  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  No lymph node was retrieved during surgery, but suspicion is low for axillary disease.  Case was discussed at breast cancer conference yesterday and was determined that no further surgery is needed and to proceed with adjuvant chemotherapy using weekly Herceptin and Taxol followed by maintenance Herceptin as well as adjuvant XRT.  Patient will also benefit from an aromatase inhibitor for 5 years at the conclusion of her XRT.  Prior to initiating treatment, patient will require port placement and MUGA scan.  Return to clinic on March 13, 2017 to initiate cycle 1 of 12 of weekly Herceptin and Taxol.   2.  Genetic testing: Referral has been given to genetic counselor and patient had blood drawn today.  Approximately 30 minutes was spent in discussion of which greater than 50% was consultation.  Patient expressed understanding and was in agreement with this plan. She also understands that She can call clinic at any time with any questions, concerns, or complaints.   Cancer Staging Primary cancer of upper outer quadrant of left female breast [facility]) Staging form: Breast, AJCC 8th Edition - Clinical stage from 02/07/2017: Stage IA (cT1c, cN0, cM0, G1, ER: Positive, PR: Positive, HER2: Positive) - Signed by Lloyd Huger, MD on 02/11/2017  Lloyd Huger, MD   02/28/2017 1:29 PM

## 2017-02-28 NOTE — Progress Notes (Signed)
START ON PATHWAY REGIMEN - Breast   Paclitaxel Weekly + Trastuzumab Weekly:   Administer weekly:     Paclitaxel      Trastuzumab      Trastuzumab   **Always confirm dose/schedule in your pharmacy ordering system**    Trastuzumab (Maintenance - NO Loading Dose):   A cycle is every 21 days:     Trastuzumab   **Always confirm dose/schedule in your pharmacy ordering system**    Patient Characteristics: Postoperative without Neoadjuvant Therapy (Pathologic Staging), Invasive Disease, Adjuvant Therapy, HER2 Positive, ER Positive, Node Negative, pT1c, pN0/N47m Therapeutic Status: Postoperative without Neoadjuvant Therapy (Pathologic Staging) AJCC Grade: GX AJCC N Category: pNX AJCC M Category: cM0 ER Status: Positive (+) AJCC 8 Stage Grouping: IA HER2 Status: Positive (+) Oncotype Dx Recurrence Score: Not Appropriate AJCC T Category: pTX PR Status: Positive (+) Intent of Therapy: Curative Intent, Discussed with Patient

## 2017-03-01 ENCOUNTER — Encounter (INDEPENDENT_AMBULATORY_CARE_PROVIDER_SITE_OTHER): Payer: Self-pay

## 2017-03-02 ENCOUNTER — Other Ambulatory Visit: Payer: Medicare Other

## 2017-03-02 ENCOUNTER — Inpatient Hospital Stay: Payer: Medicare Other | Admitting: Oncology

## 2017-03-06 ENCOUNTER — Other Ambulatory Visit (INDEPENDENT_AMBULATORY_CARE_PROVIDER_SITE_OTHER): Payer: Self-pay | Admitting: Vascular Surgery

## 2017-03-07 ENCOUNTER — Encounter
Admission: RE | Admit: 2017-03-07 | Discharge: 2017-03-07 | Disposition: A | Discharge status: Home / Self Care | Payer: Medicare Other | Source: Ambulatory Visit | Attending: Oncology | Admitting: Oncology

## 2017-03-07 DIAGNOSIS — C50919 Malignant neoplasm of unspecified site of unspecified female breast: Secondary | ICD-10-CM | POA: Diagnosis not present

## 2017-03-07 MED ORDER — TECHNETIUM TC 99M-LABELED RED BLOOD CELLS IV KIT
20.0000 | PACK | Freq: Once | INTRAVENOUS | Status: AC | PRN
  Administered 2017-03-07: 19.771 via INTRAVENOUS

## 2017-03-07 MED ORDER — CLINDAMYCIN PHOSPHATE 300 MG/50ML IV SOLN
300.0000 mg | Freq: Once | INTRAVENOUS | Status: AC
  Administered 2017-03-08: 300 mg via INTRAVENOUS

## 2017-03-08 ENCOUNTER — Ambulatory Visit
Admission: RE | Admit: 2017-03-08 | Discharge: 2017-03-08 | Disposition: A | Discharge status: Home / Self Care | Payer: Medicare Other | Source: Ambulatory Visit | Attending: Vascular Surgery | Admitting: Vascular Surgery

## 2017-03-08 ENCOUNTER — Encounter
Admission: RE | Disposition: A | Discharge status: Home / Self Care | Payer: Self-pay | Source: Ambulatory Visit | Attending: Vascular Surgery

## 2017-03-08 DIAGNOSIS — C50919 Malignant neoplasm of unspecified site of unspecified female breast: Secondary | ICD-10-CM

## 2017-03-08 HISTORY — PX: PORTA CATH INSERTION: CATH118285

## 2017-03-08 SURGERY — PORTA CATH INSERTION
Anesthesia: Moderate Sedation

## 2017-03-08 MED ORDER — SODIUM CHLORIDE 0.9 % IV SOLN: INTRAVENOUS | Status: DC

## 2017-03-08 MED ORDER — MIDAZOLAM HCL 2 MG/2ML IJ SOLN
INTRAMUSCULAR | Status: AC
  Filled 2017-03-08: qty 2

## 2017-03-08 MED ORDER — SODIUM CHLORIDE 0.9 % IR SOLN: Freq: Once | Status: DC

## 2017-03-08 MED ORDER — MIDAZOLAM HCL 2 MG/2ML IJ SOLN
INTRAMUSCULAR | Status: DC | PRN
  Administered 2017-03-08 (×3): 1 mg via INTRAVENOUS

## 2017-03-08 MED ORDER — LIDOCAINE-EPINEPHRINE (PF) 1 %-1:200000 IJ SOLN
INTRAMUSCULAR | Status: DC | PRN
  Administered 2017-03-08: 28 mL

## 2017-03-08 MED ORDER — FENTANYL CITRATE (PF) 100 MCG/2ML IJ SOLN
INTRAMUSCULAR | Status: AC
  Filled 2017-03-08: qty 2

## 2017-03-08 MED ORDER — CLINDAMYCIN PHOSPHATE 300 MG/50ML IV SOLN
INTRAVENOUS | Status: AC
  Filled 2017-03-08: qty 50

## 2017-03-08 MED ORDER — LIDOCAINE-EPINEPHRINE (PF) 1 %-1:200000 IJ SOLN
INTRAMUSCULAR | Status: AC
  Filled 2017-03-08: qty 30

## 2017-03-08 MED ORDER — FENTANYL CITRATE (PF) 100 MCG/2ML IJ SOLN
INTRAMUSCULAR | Status: DC | PRN
  Administered 2017-03-08: 50 ug via INTRAVENOUS
  Administered 2017-03-08 (×2): 25 ug via INTRAVENOUS

## 2017-03-08 SURGICAL SUPPLY — 11 items
ADH SKN CLS APL DERMABOND .7 (GAUZE/BANDAGES/DRESSINGS) ×1
DERMABOND ADVANCED (GAUZE/BANDAGES/DRESSINGS) ×1
DERMABOND ADVANCED .7 DNX12 (GAUZE/BANDAGES/DRESSINGS) ×1 IMPLANT
DRAPE INCISE IOBAN 66X45 STRL (DRAPES) ×2 IMPLANT
KIT PORT POWER 8FR ISP CVUE (Miscellaneous) ×1 IMPLANT
NEEDLE ENTRY 21GA 7CM ECHOTIP (NEEDLE) ×2 IMPLANT
PACK ANGIOGRAPHY (CUSTOM PROCEDURE TRAY) ×2 IMPLANT
SET INTRO CAPELLA COAXIAL (SET/KITS/TRAYS/PACK) ×1 IMPLANT
SUT MNCRL AB 4-0 PS2 18 (SUTURE) ×2 IMPLANT
SUT PROLENE 0 CT 1 30 (SUTURE) ×2 IMPLANT
SUTURE VIC 3-0 (SUTURE) ×2 IMPLANT

## 2017-03-08 NOTE — OR Nursing (Signed)
md aware of BP in 90's

## 2017-03-08 NOTE — OR Nursing (Signed)
Saline bolus ordered and given

## 2017-03-08 NOTE — Op Note (Signed)
OPERATIVE NOTE   PROCEDURE: 1. Placement of a right IJ Infuse-a-Port  PRE-OPERATIVE DIAGNOSIS: Breast carcinoma   POST-OPERATIVE DIAGNOSIS: Same  SURGEON: Katha Cabal M.D.  ANESTHESIA: Conscious sedation was administered under my direct supervision by the interventional radiology RN. IV Versed plus fentanyl were utilized. Continuous ECG, pulse oximetry and blood pressure was monitored throughout the entire procedure. Conscious sedation was for a total of 31 minutes.  ESTIMATED BLOOD LOSS: Minimal   FINDING(S): 1.  Patent vein  SPECIMEN(S): None  INDICATIONS:   Briana [name] is a 52 y.o. female who presents with breast carcinoma.  She will require chemotherapy and therefore requires appropriate intravenous access.  Risks and benefits of been reviewed the patient is agreed to proceed with port placement  DESCRIPTION: After obtaining full informed written consent, the patient was brought back to the special procedure suite and placed in the supine position. The patient's right neck and chest wall are prepped and draped in sterile fashion. Appropriate timeout was called.  Ultrasound is placed in a sterile sleeve, ultrasound is utilized to avoid vascular injury as well as secondary to lack of appropriate landmarks. The right internal jugular vein is identified. It is echolucent and homogeneous as well as easily compressible indicating patency. An image is recorded for the permanent record.  Access to the vein with a micropuncture needle is done under direct ultrasound visualization.  1% lidocaine is infiltrated into the soft tissue at the base of the neck as well as on the chest wall.  Under direct ultrasound visualization a micro-needle is inserted into the vein followed by the micro-wire. Micro-sheath was then advanced and a J wire is inserted without difficulty under fluoroscopic guidance. A small counterincision was created at the wire insertion site. A transverse incision is created  2 fingerbreadths below the scapula and a pocket is fashioned using both blunt and sharp dissection. The pocket is tested for appropriate size with the hub of the Infuse-a-Port. The tunneling device is then used to pull the intravascular portion of the catheter from the pocket to the neck counterincision.  Dilator and peel-away sheath were then inserted over the wire and the wire is removed. Catheter is then advanced into the venous system without difficulty. Peel-away sheath was then removed.  Catheter is then positioned under fluoroscopic guidance at the atrial caval junction. It is then transected connected to the hub and the hope is slipped into the subcutaneous pocket on the chest wall. The hub was then accessed percutaneously and aspirates easily and flushes well and is flushed with 30 cc of heparinized saline. The pocket incision is then closed in layers using interrupted 3-0 Vicryl for the subcutaneous tissues and 4-0 Monocryl subcuticular for skin closure. Dermabond is applied. The neck counterincision was closed with 4-0 Monocryl subcuticular and Dermabond as well.  The patient tolerated the procedure well and there were no immediate complications.  COMPLICATIONS: None  CONDITION: Unchanged  Katha Cabal M.D. [facility] Office: [phone]   03/08/2017, 12:28 PM

## 2017-03-08 NOTE — H&P (Signed)
[facility] History & Physical Update  The patient was interviewed and re-examined.  The patient's previous History and Physical has been reviewed and is unchanged.  There is no change in the plan of care. We plan to proceed with the scheduled procedure.  Hortencia Pilar, MD  03/08/2017, 10:51 AM

## 2017-03-08 NOTE — Patient Instructions (Signed)
Trastuzumab injection for infusion What is this medicine? TRASTUZUMAB (tras TOO zoo mab) is a monoclonal antibody. It is used to treat breast cancer and stomach cancer. This medicine may be used for other purposes; ask your health care provider or pharmacist if you have questions. COMMON BRAND NAME(S): Herceptin What should I tell my health care provider before I take this medicine? They need to know if you have any of these conditions: -heart disease -heart failure -lung or breathing disease, like asthma -an unusual or allergic reaction to trastuzumab, benzyl alcohol, or other medications, foods, dyes, or preservatives -pregnant or trying to get pregnant -breast-feeding How should I use this medicine? This drug is given as an infusion into a vein. It is administered in a hospital or clinic by a specially trained health care professional. Talk to your pediatrician regarding the use of this medicine in children. This medicine is not approved for use in children. Overdosage: If you think you have taken too much of this medicine contact a poison control center or emergency room at once. NOTE: This medicine is only for you. Do not share this medicine with others. What if I miss a dose? It is important not to miss a dose. Call your doctor or health care professional if you are unable to keep an appointment. What may interact with this medicine? This medicine may interact with the following medications: -certain types of chemotherapy, such as daunorubicin, doxorubicin, epirubicin, and idarubicin This list may not describe all possible interactions. Give your health care provider a list of all the medicines, herbs, non-prescription drugs, or dietary supplements you use. Also tell them if you smoke, drink alcohol, or use illegal drugs. Some items may interact with your medicine. What should I watch for while using this medicine? Visit your doctor for checks on your progress. Report any side effects.  Continue your course of treatment even though you feel ill unless your doctor tells you to stop. Call your doctor or health care professional for advice if you get a fever, chills or sore throat, or other symptoms of a cold or flu. Do not treat yourself. Try to avoid being around people who are sick. You may experience fever, chills and shaking during your first infusion. These effects are usually mild and can be treated with other medicines. Report any side effects during the infusion to your health care professional. Fever and chills usually do not happen with later infusions. Do not become pregnant while taking this medicine or for 7 months after stopping it. Women should inform their doctor if they wish to become pregnant or think they might be pregnant. Women of child-bearing potential will need to have a negative pregnancy test before starting this medicine. There is a potential for serious side effects to an unborn child. Talk to your health care professional or pharmacist for more information. Do not breast-feed an infant while taking this medicine or for 7 months after stopping it. Women must use effective birth control with this medicine. What side effects may I notice from receiving this medicine? Side effects that you should report to your doctor or health care professional as soon as possible: -allergic reactions like skin rash, itching or hives, swelling of the face, lips, or tongue -chest pain or palpitations -cough -dizziness -feeling faint or lightheaded, falls -fever -general ill feeling or flu-like symptoms -signs of worsening heart failure like breathing problems; swelling in your legs and feet -unusually weak or tired Side effects that usually do not require medical   attention (report to your doctor or health care professional if they continue or are bothersome): -bone pain -changes in taste -diarrhea -joint pain -nausea/vomiting -weight loss This list may not describe all  possible side effects. Call your doctor for medical advice about side effects. You may report side effects to FDA at 1-800-FDA-1088. Where should I keep my medicine? This drug is given in a hospital or clinic and will not be stored at home. NOTE: This sheet is a summary. It may not cover all possible information. If you have questions about this medicine, talk to your doctor, pharmacist, or health care provider.  2018 Elsevier/Gold Standard (2016-01-12 14:37:52) Paclitaxel injection What is this medicine? PACLITAXEL (PAK li TAX el) is a chemotherapy drug. It targets fast dividing cells, like cancer cells, and causes these cells to die. This medicine is used to treat ovarian cancer, breast cancer, and other cancers. This medicine may be used for other purposes; ask your health care provider or pharmacist if you have questions. COMMON BRAND NAME(S): Onxol, Taxol What should I tell my health care provider before I take this medicine? They need to know if you have any of these conditions: -blood disorders -irregular heartbeat -infection (especially a virus infection such as chickenpox, cold sores, or herpes) -liver disease -previous or ongoing radiation therapy -an unusual or allergic reaction to paclitaxel, alcohol, polyoxyethylated castor oil, other chemotherapy agents, other medicines, foods, dyes, or preservatives -pregnant or trying to get pregnant -breast-feeding How should I use this medicine? This drug is given as an infusion into a vein. It is administered in a hospital or clinic by a specially trained health care professional. Talk to your pediatrician regarding the use of this medicine in children. Special care may be needed. Overdosage: If you think you have taken too much of this medicine contact a poison control center or emergency room at once. NOTE: This medicine is only for you. Do not share this medicine with others. What if I miss a dose? It is important not to miss your  dose. Call your doctor or health care professional if you are unable to keep an appointment. What may interact with this medicine? Do not take this medicine with any of the following medications: -disulfiram -metronidazole This medicine may also interact with the following medications: -cyclosporine -diazepam -ketoconazole -medicines to increase blood counts like filgrastim, pegfilgrastim, sargramostim -other chemotherapy drugs like cisplatin, doxorubicin, epirubicin, etoposide, teniposide, vincristine -quinidine -testosterone -vaccines -verapamil Talk to your doctor or health care professional before taking any of these medicines: -acetaminophen -aspirin -ibuprofen -ketoprofen -naproxen This list may not describe all possible interactions. Give your health care provider a list of all the medicines, herbs, non-prescription drugs, or dietary supplements you use. Also tell them if you smoke, drink alcohol, or use illegal drugs. Some items may interact with your medicine. What should I watch for while using this medicine? Your condition will be monitored carefully while you are receiving this medicine. You will need important blood work done while you are taking this medicine. This medicine can cause serious allergic reactions. To reduce your risk you will need to take other medicine(s) before treatment with this medicine. If you experience allergic reactions like skin rash, itching or hives, swelling of the face, lips, or tongue, tell your doctor or health care professional right away. In some cases, you may be given additional medicines to help with side effects. Follow all directions for their use. This drug may make you feel generally unwell. This is not uncommon,   as chemotherapy can affect healthy cells as well as cancer cells. Report any side effects. Continue your course of treatment even though you feel ill unless your doctor tells you to stop. Call your doctor or health care  professional for advice if you get a fever, chills or sore throat, or other symptoms of a cold or flu. Do not treat yourself. This drug decreases your body's ability to fight infections. Try to avoid being around people who are sick. This medicine may increase your risk to bruise or bleed. Call your doctor or health care professional if you notice any unusual bleeding. Be careful brushing and flossing your teeth or using a toothpick because you may get an infection or bleed more easily. If you have any dental work done, tell your dentist you are receiving this medicine. Avoid taking products that contain aspirin, acetaminophen, ibuprofen, naproxen, or ketoprofen unless instructed by your doctor. These medicines may hide a fever. Do not become pregnant while taking this medicine. Women should inform their doctor if they wish to become pregnant or think they might be pregnant. There is a potential for serious side effects to an unborn child. Talk to your health care professional or pharmacist for more information. Do not breast-feed an infant while taking this medicine. Men are advised not to father a child while receiving this medicine. This product may contain alcohol. Ask your pharmacist or healthcare provider if this medicine contains alcohol. Be sure to tell all healthcare providers you are taking this medicine. Certain medicines, like metronidazole and disulfiram, can cause an unpleasant reaction when taken with alcohol. The reaction includes flushing, headache, nausea, vomiting, sweating, and increased thirst. The reaction can last from 30 minutes to several hours. What side effects may I notice from receiving this medicine? Side effects that you should report to your doctor or health care professional as soon as possible: -allergic reactions like skin rash, itching or hives, swelling of the face, lips, or tongue -low blood counts - This drug may decrease the number of white blood cells, red blood  cells and platelets. You may be at increased risk for infections and bleeding. -signs of infection - fever or chills, cough, sore throat, pain or difficulty passing urine -signs of decreased platelets or bleeding - bruising, pinpoint red spots on the skin, black, tarry stools, nosebleeds -signs of decreased red blood cells - unusually weak or tired, fainting spells, lightheadedness -breathing problems -chest pain -high or low blood pressure -mouth sores -nausea and vomiting -pain, swelling, redness or irritation at the injection site -pain, tingling, numbness in the hands or feet -slow or irregular heartbeat -swelling of the ankle, feet, hands Side effects that usually do not require medical attention (report to your doctor or health care professional if they continue or are bothersome): -bone pain -complete hair loss including hair on your head, underarms, pubic hair, eyebrows, and eyelashes -changes in the color of fingernails -diarrhea -loosening of the fingernails -loss of appetite -muscle or joint pain -red flush to skin -sweating This list may not describe all possible side effects. Call your doctor for medical advice about side effects. You may report side effects to FDA at 1-800-FDA-1088. Where should I keep my medicine? This drug is given in a hospital or clinic and will not be stored at home. NOTE: This sheet is a summary. It may not cover all possible information. If you have questions about this medicine, talk to your doctor, pharmacist, or health care provider.  2018 Elsevier/Gold Standard (2014-11-18   19:58:00)  

## 2017-03-08 NOTE — OR Nursing (Signed)
Total NS bolus 300 ml resumed rate 75 ml/hr

## 2017-03-09 ENCOUNTER — Inpatient Hospital Stay: Payer: Medicare Other | Attending: Oncology

## 2017-03-09 ENCOUNTER — Telehealth: Payer: Self-pay

## 2017-03-09 ENCOUNTER — Encounter: Payer: Self-pay | Admitting: Genetic Counselor

## 2017-03-09 ENCOUNTER — Telehealth: Payer: Self-pay | Admitting: Genetic Counselor

## 2017-03-09 DIAGNOSIS — Z0000 Encounter for general adult medical examination without abnormal findings: Secondary | ICD-10-CM | POA: Insufficient documentation

## 2017-03-09 DIAGNOSIS — F419 Anxiety disorder, unspecified: Secondary | ICD-10-CM | POA: Insufficient documentation

## 2017-03-09 DIAGNOSIS — K219 Gastro-esophageal reflux disease without esophagitis: Secondary | ICD-10-CM | POA: Insufficient documentation

## 2017-03-09 DIAGNOSIS — J449 Chronic obstructive pulmonary disease, unspecified: Secondary | ICD-10-CM | POA: Insufficient documentation

## 2017-03-09 DIAGNOSIS — Z170 Estrogen receptor positive status [ER+]: Secondary | ICD-10-CM | POA: Insufficient documentation

## 2017-03-09 DIAGNOSIS — Z1379 Encounter for other screening for genetic and chromosomal anomalies: Secondary | ICD-10-CM

## 2017-03-09 DIAGNOSIS — Z5111 Encounter for antineoplastic chemotherapy: Secondary | ICD-10-CM | POA: Insufficient documentation

## 2017-03-09 DIAGNOSIS — C50412 Malignant neoplasm of upper-outer quadrant of left female breast: Secondary | ICD-10-CM | POA: Insufficient documentation

## 2017-03-09 DIAGNOSIS — M5136 Other intervertebral disc degeneration, lumbar region: Secondary | ICD-10-CM | POA: Insufficient documentation

## 2017-03-09 DIAGNOSIS — G2581 Restless legs syndrome: Secondary | ICD-10-CM | POA: Insufficient documentation

## 2017-03-09 DIAGNOSIS — G4730 Sleep apnea, unspecified: Secondary | ICD-10-CM | POA: Insufficient documentation

## 2017-03-09 DIAGNOSIS — N189 Chronic kidney disease, unspecified: Secondary | ICD-10-CM | POA: Insufficient documentation

## 2017-03-09 DIAGNOSIS — F17210 Nicotine dependence, cigarettes, uncomplicated: Secondary | ICD-10-CM | POA: Insufficient documentation

## 2017-03-09 DIAGNOSIS — Z79899 Other long term (current) drug therapy: Secondary | ICD-10-CM | POA: Insufficient documentation

## 2017-03-09 DIAGNOSIS — K589 Irritable bowel syndrome without diarrhea: Secondary | ICD-10-CM | POA: Insufficient documentation

## 2017-03-09 DIAGNOSIS — F329 Major depressive disorder, single episode, unspecified: Secondary | ICD-10-CM | POA: Insufficient documentation

## 2017-03-09 DIAGNOSIS — M797 Fibromyalgia: Secondary | ICD-10-CM | POA: Insufficient documentation

## 2017-03-09 HISTORY — DX: Encounter for other screening for genetic and chromosomal anomalies: Z13.79

## 2017-03-09 NOTE — Telephone Encounter (Signed)
Patient called office at this time. She is very upset regarding a medication that is on her list. She is not happy with her care here and stated that our office is not coordinated. Please see Maritza's note regarding the Oxycodone prescription. She can be reached [phone] this afternoon. She would like a call back.

## 2017-03-09 NOTE — Telephone Encounter (Signed)
Cancer Genetics             Telegenetics Results Disclosure   Patient Name: Briana [name] Patient DOB: Apr 04, 1965 Patient Age: 52 y.o. Phone Call Date: 03/09/2017  Referring Provider: Alyssa Grove, MD    [name] was called today to discuss genetic test results. Please see the Genetics telephone note from 02/24/2017 for a detailed discussion of her personal and family histories and the recommendations provided.  Genetic Testing: At the time of [name]'s telegenetics visit, she decided to pursue genetic testing of multiple genes associated with hereditary susceptibility to cancer. Testing included sequencing and deletion/duplication analysis. Testing did not reveal any pathogenic mutation in any of these genes.  A copy of the genetic test report will be scanned into Epic under the Media tab.  The genes analyzed were the 83 genes on [facility]'s Multi-Cancer panel (ALK, APC, ATM, AXIN2, BAP1, BARD1, BLM, BMPR1A, BRCA1, BRCA2, BRIP1, CASR, CDC73, CDH1, CDK4, CDKN1B, CDKN1C, CDKN2A, CEBPA, CHEK2, CTNNA1, DICER1, DIS3L2, EGFR, EPCAM, FH, FLCN, GATA2, GPC3, GREM1, HOXB13, HRAS, KIT, MAX, MEN1, MET, MITF, MLH1, MSH2, MSH3, MSH6, MUTYH, NBN, NF1, NF2, NTHL1, PALB2, PDGFRA, PHOX2B, PMS2, POLD1, POLE, POT1, PRKAR1A, PTCH1, PTEN, RAD50, RAD51C, RAD51D, RB1, RECQL4, RET, RUNX1, SDHA, SDHAF2, SDHB, SDHC, SDHD, SMAD4, SMARCA4, SMARCB1, SMARCE1, STK11, SUFU, TERC, TERT, TMEM127, TP53, TSC1, TSC2, VHL, WRN, WT1).  [name] previously reported that maternal family members have a genetic mutation. 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 Given [facility]'s coverage of the Lynch genes, it is likely she does not need to be overly concerned about being unable to obtain a copy from her relatives. No additional testing is recommended at this time for [name].  Cancer Screening: [name] is recommended to  continue to follow the cancer screening guidelines provided by her physician.   Family Members: Close family members are at some increased risk of developing cancer, over the general population risk, simply due to the family history. Those in her maternal family are recommended to pursue a genetics evaluation for Lynch syndrome based on her report of a possible Lynch-associated mutation in a maternal cousin.   Any relative who had cancer at a young age or had a particularly rare cancer may also wish to pursue genetic testing. Genetic counselors can be located in other cities, by visiting the website of the [employer] ([email or web address]) and Field seismologist for a Dietitian by zip code.    Lastly, cancer genetics is a rapidly advancing field and it is possible that new genetic tests will be appropriate for [name] in the future. We encourage [name] to remain in contact with Genetics on an annual basis so her personal and family histories can be updated.    Steele Berg, MS, [place] Certified Genetic Counselor phone: [phone]

## 2017-03-10 NOTE — Progress Notes (Signed)
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  INTERVAL HISTORY: Patient returns to clinic today for further evaluation and initiation of cycle 1 of 12 of weekly Herceptin and Taxol.  She continues to be anxious, but otherwise feels well.  She has no neurologic complaints.  She denies any recent fevers or illnesses.  She has a good appetite and denies weight loss.  She has no chest pain or shortness of breath.  She denies any nausea, vomiting, constipation, or diarrhea.  She has no urinary complaints.  Patient offers no specific complaints today.  REVIEW OF SYSTEMS:   Review of Systems  Constitutional: Negative.  Negative for fever, malaise/fatigue and weight loss.  Respiratory: Negative.  Negative for cough and shortness of breath.   Cardiovascular: Negative.  Negative for chest pain and leg swelling.  Gastrointestinal: Negative.  Negative for abdominal pain.  Genitourinary: Negative.   Musculoskeletal: Negative.   Skin: Negative for rash.  Neurological: Negative.  Negative for sensory change and weakness.  Psychiatric/Behavioral: The patient is nervous/anxious.     As per HPI. Otherwise, a complete review of systems is negative.  PAST MEDICAL HISTORY: Past Medical History:  Diagnosis Date  . Abnormal Pap smear of cervix    01/2015 ascus/neg- 04/2015 ascus/neg  . Allergy   . Anxiety   . Arthritis   . Asthma   . Chronic kidney disease   . COPD (chronic obstructive pulmonary disease) ([place])   . DDD (degenerative disc disease), lumbar   . Depression   . Elevated lipids   . Fibromyalgia   . Fibromyalgia   . Genetic testing 03/09/2017   Multi-Cancer panel (83 genes) @  [facility] - No pathogenic mutations detected  . GERD (gastroesophageal reflux disease)   . History of IBS   . Hyperthyroidism   . Joint disease   . Migraine   . Migraine   . Oxygen deficiency   . Restless leg syndrome   . Sleep apnea    Does not use C-PAP, cannot tolerate mask    PAST SURGICAL HISTORY: Past Surgical History:  Procedure Laterality Date  . ABDOMINAL HYSTERECTOMY  2008  . BREAST BIOPSY Left 02/02/2017   [place] core path pending  . BREAST EXCISIONAL BIOPSY Left 02/17/2017   lumpectomy with nl sn  . BREAST LUMPECTOMY WITH NEEDLE LOCALIZATION Left 02/17/2017   Procedure: BREAST LUMPECTOMY WITH NEEDLE LOCALIZATION;  Surgeon: Briana Epley, MD;  Location: [facility];  Service: General;  Laterality: Left;  . CARPAL TUNNEL RELEASE Bilateral   . cryotherapy    . DILATION AND CURETTAGE OF UTERUS    . ENDOMETRIAL ABLATION    . LAPAROSCOPIC OOPHERECTOMY Left    unsure which side but thinks its the left  . LYSIS OF ADHESION Left 10/14/2015   Procedure: LYSIS OF ADHESION;  Surgeon: Briana Park, MD;  Location: [facility];  Service: Orthopedics;  Laterality: Left;  . NECK SURGERY     lower neck fusion rods and screws  . PORTA CATH INSERTION N/A 03/08/2017   Procedure: PORTA CATH INSERTION;  Surgeon: Briana Cabal, MD;  Location: [facility];  Service: Cardiovascular;  Laterality: N/A;  . RESECTION DISTAL CLAVICAL Left 10/14/2015   Procedure: RESECTION DISTAL CLAVICAL;  Surgeon: Briana Park, MD;  Location: [facility];  Service: Orthopedics;  Laterality: Left;  . SENTINEL NODE BIOPSY Left 02/17/2017   Procedure: SENTINEL NODE BIOPSY;  Surgeon: Briana Epley, MD;  Location: [facility];  Service: General;  Laterality: Left;  . SHOULDER ARTHROSCOPY WITH OPEN ROTATOR CUFF REPAIR Left 10/14/2015   Procedure: SHOULDER ARTHROSCOPY WITH OPEN ROTATOR CUFF REPAIR;  Surgeon: Briana Park, MD;  Location: [facility];  Service: Orthopedics;  Laterality: Left;  . SHOULDER ARTHROSCOPY  WITH OPEN ROTATOR CUFF REPAIR AND DISTAL CLAVICLE ACROMINECTOMY Right 09/03/2014   Procedure: RIGHT SHOULDER ARTHROSCOPY WITH MINI OPEN ROTATOR CUFF TEAR;  Surgeon: Briana Park, MD;  Location: [facility];  Service: Orthopedics;  Laterality: Right;  biceps tenodesis, arthroscopic subacromial decompression and distal clavicle incision  . spinal injections    . SUBACROMIAL DECOMPRESSION Left 10/14/2015   Procedure: SUBACROMIAL DECOMPRESSION;  Surgeon: Briana Park, MD;  Location: [facility];  Service: Orthopedics;  Laterality: Left;    FAMILY HISTORY: Family History  Problem Relation Age of Onset  . Breast cancer Mother 19       Glioblastoma also; deceased at 74  . Diabetes Father   . Lung cancer Father        mesothelioma; deceased 76s  . Cancer Maternal Aunt        "bone ca"; unk. primary  . Colon cancer Maternal Grandfather        dx in 48s; deceased 51  . Colon cancer Maternal Aunt        dx in 53s; currently 24s  . Lung cancer Maternal Grandmother        smoker; deceased 45  . Lung cancer Paternal Grandmother        smoker; deceased 52s  . Breast cancer Cousin        dx 4s; daughter of mat aunt with unk. primary cancer  . Cancer Other        distant cousin; unknown primary  . Colon cancer Other        dx 61s; currently 38; maternal half-sister    ADVANCED DIRECTIVES (Y/N):  N  HEALTH MAINTENANCE: Social History   Tobacco Use  . Smoking status: Current Every Day Smoker    Packs/day: 1.00    Types: Cigarettes  . Smokeless tobacco: Never Used  Substance Use Topics  . Alcohol use: No    Alcohol/week: 0.0 oz  . Drug use: No     Colonoscopy:  PAP:  Bone density:  Lipid panel:  Allergies  Allergen Reactions  . Gadolinium Derivatives Swelling and Other (See Comments)    NECK BECAME RED AND TONGUE WAS SWELLING SLIGHTLY PER PT NECK BECAME RED AND TONGUE WAS SWELLING SLIGHTLY PER PT  . Iodine   . Bee Venom Hives and Rash  . Cefoxitin Rash  . Cefuroxime Axetil  Hives and Rash  . Cucumber Extract Rash  . Iodinated Diagnostic Agents Hives and Rash  . Latex Rash  . Other Hives and Rash    Bee Stings-swelling/hives/rash Wool (textile fiber)-Rash/itching  . Tomato Rash    Red tomatoes    Current Outpatient Medications  Medication Sig Dispense Refill  . albuterol (ACCUNEB) 0.63 MG/3ML nebulizer solution Take 1 ampule by nebulization every 6 (six) hours as needed for wheezing.    . baclofen (LIORESAL) 10 MG tablet Take 10 mg by mouth 3 (three) times daily as needed for muscle spasms (typically 2-3 times daily).     . cholecalciferol (VITAMIN D) 1000 UNITS tablet Take 1,000 Units  by mouth 2 (two) times daily.     . diclofenac sodium (VOLTAREN) 1 % GEL Apply 2 g topically 4 (four) times daily as needed (for pain.).     Marland Kitchen EPINEPHrine (EPIPEN JR) 0.15 MG/0.3ML injection Inject 0.15 mg into the muscle as needed for anaphylaxis.    Marland Kitchen estrogen, conjugated,-medroxyprogesterone (PREMPRO) 0.3-1.5 MG per tablet Take 1 tablet by mouth every other day.     . hydrocortisone 2.5 % cream Apply 1 application topically 2 (two) times daily as needed (for itchy/dry skin.).    Marland Kitchen lidocaine-prilocaine (EMLA) cream Apply to affected area once 30 g 3  . linaclotide (LINZESS) 145 MCG CAPS capsule Take 145 mcg by mouth daily as needed (for constipation.).    Marland Kitchen LORazepam (ATIVAN) 0.5 MG tablet Take 0.5 mg by mouth 3 (three) times daily as needed for anxiety.     . magnesium oxide (MAG-OX) 400 MG tablet Take 400 mg by mouth daily. Reported on 07/08/2015    . Multiple Vitamins-Minerals (HAIR SKIN AND NAILS FORMULA) TABS Take 2 tablets by mouth daily.     Marland Kitchen omeprazole (PRILOSEC) 20 MG capsule Take 20 mg by mouth daily before breakfast.     . ondansetron (ZOFRAN) 8 MG tablet Take 1 tablet (8 mg total) by mouth 2 (two) times daily as needed (Nausea or vomiting). 30 tablet 2  . oxyCODONE 10 MG TABS Take 1 tablet (10 mg total) by mouth every 4 (four) hours as needed for breakthrough pain.  60 tablet 0  . prochlorperazine (COMPAZINE) 10 MG tablet Take 1 tablet (10 mg total) by mouth every 6 (six) hours as needed (Nausea or vomiting). 60 tablet 2  . promethazine (PHENERGAN) 25 MG tablet Take 25 mg by mouth every 6 (six) hours as needed for nausea or vomiting.    . rizatriptan (MAXALT-MLT) 10 MG disintegrating tablet Take 10 mg by mouth as needed for migraine. May repeat in 2 hours if needed    . rosuvastatin (CRESTOR) 40 MG tablet Take 40 mg by mouth at bedtime.     . topiramate (TOPAMAX) 100 MG tablet Take 200 mg by mouth daily.     . vitamin E (VITAMIN E) 200 UNIT capsule Take 200 Units by mouth 4 (four) times a week. Take 4-5 times a week     No current facility-administered medications for this visit.     OBJECTIVE: Vitals:   03/13/17 1003  BP: 103/78  Pulse: (!) 105  Resp: 18  Temp: 98.5 F (36.9 C)     Body mass index is 25.22 kg/m.    ECOG FS:0 - Asymptomatic  General: Well-developed, well-nourished, no acute distress. Eyes: Pink conjunctiva, anicteric sclera. Breast: Patient requested exam be deferred today. Lungs: Clear to auscultation bilaterally. Heart: Regular rate and rhythm. No rubs, murmurs, or gallops. Abdomen: Soft, nontender, nondistended. No organomegaly noted, normoactive bowel sounds. Musculoskeletal: No edema, cyanosis, or clubbing. Neuro: Alert, answering all questions appropriately. Cranial nerves grossly intact. Skin: No rashes or petechiae noted. Psych: Normal affect.  LAB RESULTS:  Lab Results  Component Value Date   NA 139 03/13/2017   K 3.6 03/13/2017   CL 111 03/13/2017   CO2 22 03/13/2017   GLUCOSE 149 (H) 03/13/2017   BUN 22 (H) 03/13/2017   CREATININE 1.01 (H) 03/13/2017   CALCIUM 9.0 03/13/2017   PROT 7.3 03/13/2017   ALBUMIN 3.8 03/13/2017   AST 25 03/13/2017   ALT 15 03/13/2017   ALKPHOS 74 03/13/2017   BILITOT 0.4 03/13/2017  GFRNONAA >60 03/13/2017   GFRAA >60 03/13/2017    Lab Results  Component Value Date    WBC 10.0 03/13/2017   NEUTROABS 6.8 (H) 03/13/2017   HGB 14.1 03/13/2017   HCT 42.3 03/13/2017   MCV 98.8 03/13/2017   PLT 274 03/13/2017     STUDIES: Chest 2 View  Result Date: 02/15/2017 CLINICAL DATA:  Preop chest radiograph. EXAM: CHEST  2 VIEW COMPARISON:  None FINDINGS: The heart size and mediastinal contours are within normal limits. Chronic interstitial coarsening is identified throughout both lungs. No superimposed airspace consolidation. No pulmonary edema or pleural effusion. The visualized skeletal structures are unremarkable. IMPRESSION: 1. Chronic interstitial coarsening likely reflecting smoking related changes. 2. No acute cardiopulmonary abnormality. Electronically Signed   By: Kerby Moors M.D.   On: 02/15/2017 08:27   Nm Cardiac Muga Rest  Result Date: 03/07/2017 CLINICAL DATA:  LEFT breast cancer, pre cardiotoxic chemotherapy EXAM: NUCLEAR MEDICINE CARDIAC BLOOD POOL IMAGING (MUGA) TECHNIQUE: Cardiac multi-gated acquisition was performed at rest following intravenous injection of Tc-14mlabeled red blood cells. RADIOPHARMACEUTICALS:  19.771 mCi Tc-965mertechnetate in-vitro labeled red blood cells IV COMPARISON:  None. FINDINGS: Normal LEFT ventricular ejection fraction of 54%. Study was obtained at a cardiac rate of 70 beats per minute. Patient was rhythmic during imaging. Cine analysis of the LEFT ventricle in 3 projections demonstrates normal LEFT ventricular wall motion. IMPRESSION: Normal LEFT ventricular ejection fraction of 54% with normal LV wall motion. Electronically Signed   By: MaLavonia Dana.D.   On: 03/07/2017 15:56   Nm Sentinel Node Injection  Result Date: 02/17/2017 CLINICAL DATA:  Left breast cancer. EXAM: NUCLEAR MEDICINE BREAST LYMPHOSCINTIGRAPHY TECHNIQUE: Intradermal injection of radiopharmaceutical was performed at the 12 o'clock, 3 o'clock, 6 o'clock, and 9 o'clock positions around the left nipple. The patient was then sent to the operating room where  the sentinel node(s) were identified and removed by the surgeon. RADIOPHARMACEUTICALS:  Total of 1 mCi Millipore-filtered Technetium-9935mlfur colloid, injected in four aliquots of 0.25 mCi each. IMPRESSION: Uncomplicated intradermal injection of a total of 1 mCi Technetium-[id number] colloid for purposes of sentinel node identification. Electronically Signed   By: JohnSandi Mariscal.   On: 02/17/2017 09:56   Mm Breast Surgical Specimen  Result Date: 02/17/2017 CLINICAL DATA:  Status post left lumpectomy for left breast cancer. EXAM: SPECIMEN RADIOGRAPH OF THE LEFT BREAST COMPARISON:  Previous exam(s). FINDINGS: Status post excision of the left breast. The wire tips and biopsy marker clip are present and are marked for pathology. IMPRESSION: Specimen radiograph of the left breast. Electronically Signed   By: Wei-Abelardo Diesel.   On: 02/17/2017 12:36   Mm Lt Plc Breast Loc Dev   1st Lesion  Inc Mammo Guide  Result Date: 02/17/2017 CLINICAL DATA:  Left breast cancer for needle localization prior to surgery. EXAM: NEEDLE LOCALIZATION OF THE LEFT BREAST WITH MAMMO GUIDANCE COMPARISON:  Previous exams. FINDINGS: Patient presents for needle localization prior to left breast surgery. I met with the patient and we discussed the procedure of needle localization including benefits and alternatives. We discussed the high likelihood of a successful procedure. We discussed the risks of the procedure, including infection, bleeding, tissue injury, and further surgery. Informed, written consent was given. The usual time-out protocol was performed immediately prior to the procedure. Using mammographic guidance, sterile technique, 1% lidocaine and a 5 cm modified Kopans needle, ribbon biopsy clip localized using cranial approach. The images were marked for Dr. DaviShanon Browe first  wire that was put in was deeper than expected. Therefore, a second wire shorter wire was put in via the same direction. IMPRESSION: Needle localization  left breast. No apparent complications. Electronically Signed   By: Abelardo Diesel M.D.   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  PLAN:    1. 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  She has also had port placement.  Proceed with cycle 1 of 12 of weekly Taxol and Herceptin, patient will then proceed with adjuvant XRT as well as Herceptin maintenance for 1 year. Finally, because patient has an ER/PR positive malignancy, she will benefit from an aromatase inhibitor for 5 years at the conclusion of all her treatments.  Return to clinic in 1 week for further evaluation and consideration of cycle 2. 2.  Genetic testing: Negative.  No further interventions are needed.  Approximately 30 minutes was spent in discussion of which greater than 50% was consultation.  Patient expressed understanding and was in agreement with this plan. She also understands that She can call clinic at any time with any questions, concerns, or complaints.   Cancer Staging Primary cancer of upper outer quadrant of left female breast [facility]) Staging form: Breast, AJCC 8th Edition - Clinical stage from 02/07/2017: Stage IA (cT1c, cN0, cM0, G1, ER: Positive, PR: Positive, HER2: Positive) - Signed by Lloyd Huger, MD on 02/11/2017   Lloyd Huger, MD   03/14/2017 1:52 PM

## 2017-03-13 ENCOUNTER — Inpatient Hospital Stay: Payer: Medicare Other

## 2017-03-13 ENCOUNTER — Inpatient Hospital Stay (HOSPITAL_BASED_OUTPATIENT_CLINIC_OR_DEPARTMENT_OTHER): Payer: Medicare Other | Admitting: Oncology

## 2017-03-13 VITALS — BP 107/69 | HR 75 | Resp 20

## 2017-03-13 VITALS — BP 103/78 | HR 105 | Temp 98.5°F | Resp 18 | Wt 133.5 lb

## 2017-03-13 DIAGNOSIS — F17210 Nicotine dependence, cigarettes, uncomplicated: Secondary | ICD-10-CM | POA: Diagnosis not present

## 2017-03-13 DIAGNOSIS — Z95828 Presence of other vascular implants and grafts: Secondary | ICD-10-CM

## 2017-03-13 DIAGNOSIS — Z170 Estrogen receptor positive status [ER+]: Secondary | ICD-10-CM

## 2017-03-13 DIAGNOSIS — C50412 Malignant neoplasm of upper-outer quadrant of left female breast: Secondary | ICD-10-CM

## 2017-03-13 DIAGNOSIS — K219 Gastro-esophageal reflux disease without esophagitis: Secondary | ICD-10-CM | POA: Diagnosis not present

## 2017-03-13 DIAGNOSIS — K589 Irritable bowel syndrome without diarrhea: Secondary | ICD-10-CM | POA: Diagnosis not present

## 2017-03-13 DIAGNOSIS — Z79899 Other long term (current) drug therapy: Secondary | ICD-10-CM | POA: Diagnosis not present

## 2017-03-13 DIAGNOSIS — Z5111 Encounter for antineoplastic chemotherapy: Secondary | ICD-10-CM | POA: Diagnosis not present

## 2017-03-13 DIAGNOSIS — F419 Anxiety disorder, unspecified: Secondary | ICD-10-CM | POA: Diagnosis not present

## 2017-03-13 DIAGNOSIS — G2581 Restless legs syndrome: Secondary | ICD-10-CM | POA: Diagnosis not present

## 2017-03-13 DIAGNOSIS — M5136 Other intervertebral disc degeneration, lumbar region: Secondary | ICD-10-CM | POA: Diagnosis not present

## 2017-03-13 DIAGNOSIS — F329 Major depressive disorder, single episode, unspecified: Secondary | ICD-10-CM | POA: Diagnosis not present

## 2017-03-13 DIAGNOSIS — J449 Chronic obstructive pulmonary disease, unspecified: Secondary | ICD-10-CM | POA: Diagnosis not present

## 2017-03-13 DIAGNOSIS — M797 Fibromyalgia: Secondary | ICD-10-CM | POA: Diagnosis not present

## 2017-03-13 DIAGNOSIS — G4730 Sleep apnea, unspecified: Secondary | ICD-10-CM | POA: Diagnosis not present

## 2017-03-13 DIAGNOSIS — N189 Chronic kidney disease, unspecified: Secondary | ICD-10-CM | POA: Diagnosis not present

## 2017-03-13 LAB — COMPREHENSIVE METABOLIC PANEL
ALBUMIN: 3.8 g/dL (ref 3.5–5.0)
ALT: 15 U/L (ref 14–54)
ANION GAP: 6 (ref 5–15)
AST: 25 U/L (ref 15–41)
Alkaline Phosphatase: 74 U/L (ref 38–126)
BUN: 22 mg/dL — ABNORMAL HIGH (ref 6–20)
CO2: 22 mmol/L (ref 22–32)
Calcium: 9 mg/dL (ref 8.9–10.3)
Chloride: 111 mmol/L (ref 101–111)
Creatinine, Ser: 1.01 mg/dL — ABNORMAL HIGH (ref 0.44–1.00)
GFR calc Af Amer: >60 mL/min (ref >60)
GFR calc non Af Amer: >60 mL/min (ref >60)
GLUCOSE: 149 mg/dL — AB (ref 65–99)
POTASSIUM: 3.6 mmol/L (ref 3.5–5.1)
Sodium: 139 mmol/L (ref 135–145)
Total Bilirubin: 0.4 mg/dL (ref 0.3–1.2)
Total Protein: 7.3 g/dL (ref 6.5–8.1)

## 2017-03-13 LAB — CBC WITH DIFFERENTIAL/PLATELET
Basophils Absolute: 0.1 K/uL (ref 0–0.1)
Basophils Relative: 1 %
Eosinophils Absolute: 0.4 K/uL (ref 0–0.7)
Eosinophils Relative: 5 %
HCT: 42.3 % (ref 35.0–47.0)
Hemoglobin: 14.1 g/dL (ref 12.0–16.0)
Lymphocytes Relative: 21 %
Lymphs Abs: 2.1 K/uL (ref 1.0–3.6)
MCH: 33 pg (ref 26.0–34.0)
MCHC: 33.4 g/dL (ref 32.0–36.0)
MCV: 98.8 fL (ref 80.0–100.0)
Monocytes Absolute: 0.5 K/uL (ref 0.2–0.9)
Monocytes Relative: 5 %
Neutro Abs: 6.8 K/uL — ABNORMAL HIGH (ref 1.4–6.5)
Neutrophils Relative %: 68 %
Platelets: 274 K/uL (ref 150–440)
RBC: 4.28 MIL/uL (ref 3.80–5.20)
RDW: 13.5 % (ref 11.5–14.5)
WBC: 10 K/uL (ref 3.6–11.0)

## 2017-03-13 MED ORDER — HEPARIN SOD (PORK) LOCK FLUSH 100 UNIT/ML IV SOLN
500.0000 [IU] | Freq: Once | INTRAVENOUS | Status: AC
  Administered 2017-03-13: 500 [IU] via INTRAVENOUS
  Filled 2017-03-13: qty 5

## 2017-03-13 MED ORDER — SODIUM CHLORIDE 0.9% FLUSH
10.0000 mL | Freq: Once | INTRAVENOUS | Status: AC
  Administered 2017-03-13: 10 mL via INTRAVENOUS
  Filled 2017-03-13: qty 10

## 2017-03-13 MED ORDER — DIPHENHYDRAMINE HCL 50 MG/ML IJ SOLN
25.0000 mg | Freq: Once | INTRAMUSCULAR | Status: AC
  Administered 2017-03-13: 25 mg via INTRAVENOUS
  Filled 2017-03-13: qty 1

## 2017-03-13 MED ORDER — SODIUM CHLORIDE 0.9 % IV SOLN: 10.0000 mg | Freq: Once | INTRAVENOUS | Status: DC

## 2017-03-13 MED ORDER — SODIUM CHLORIDE 0.9 % IV SOLN
Freq: Once | INTRAVENOUS | Status: AC
  Administered 2017-03-13: 11:00:00 via INTRAVENOUS
  Filled 2017-03-13: qty 1000

## 2017-03-13 MED ORDER — DEXAMETHASONE SODIUM PHOSPHATE 10 MG/ML IJ SOLN
10.0000 mg | Freq: Once | INTRAMUSCULAR | Status: AC
  Administered 2017-03-13: 10 mg via INTRAVENOUS
  Filled 2017-03-13: qty 1

## 2017-03-13 MED ORDER — PACLITAXEL CHEMO INJECTION 300 MG/50ML
80.0000 mg/m2 | Freq: Once | INTRAVENOUS | Status: AC
  Administered 2017-03-13: 132 mg via INTRAVENOUS
  Filled 2017-03-13: qty 22

## 2017-03-13 MED ORDER — TRASTUZUMAB CHEMO 150 MG IV SOLR
4.0000 mg/kg | Freq: Once | INTRAVENOUS | Status: AC
  Administered 2017-03-13: 252 mg via INTRAVENOUS
  Filled 2017-03-13: qty 12

## 2017-03-13 MED ORDER — ACETAMINOPHEN 325 MG PO TABS
650.0000 mg | ORAL_TABLET | Freq: Once | ORAL | Status: AC
  Administered 2017-03-13: 650 mg via ORAL
  Filled 2017-03-13: qty 2

## 2017-03-13 MED ORDER — HEPARIN SOD (PORK) LOCK FLUSH 100 UNIT/ML IV SOLN: 500.0000 [IU] | Freq: Once | INTRAVENOUS | Status: DC | PRN

## 2017-03-13 MED ORDER — FAMOTIDINE IN NACL 20-0.9 MG/50ML-% IV SOLN
20.0000 mg | Freq: Once | INTRAVENOUS | Status: AC
  Administered 2017-03-13: 20 mg via INTRAVENOUS
  Filled 2017-03-13: qty 50

## 2017-03-13 NOTE — Progress Notes (Signed)
Pt in for first chemo treatment today.  Denies any difficulties.  Pt has appt conflicts for Feb 21HY and March 4th request approval to move those appts to Tuesday.

## 2017-03-15 ENCOUNTER — Telehealth: Payer: Self-pay | Admitting: Surgery

## 2017-03-15 NOTE — Telephone Encounter (Signed)
Contacted patient on 03/13/17 in reference to her request to speak to manager - did not get an answer. If patient calls back - please notify kim to take her call.

## 2017-03-17 ENCOUNTER — Ambulatory Visit: Payer: Self-pay | Admitting: Genetic Counselor

## 2017-03-19 NOTE — Progress Notes (Signed)
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  INTERVAL HISTORY: Patient returns to clinic today for further evaluation and consideration of cycle 2 of 12 of weekly Herceptin and Taxol.  She tolerated her first infusion well only with some mild increase in joint pain.  She continues to be anxious, but otherwise feels well.  She has no neurologic complaints.  She denies any recent fevers or illnesses.  She has a good appetite and denies weight loss.  She has no chest pain or shortness of breath.  She denies any nausea, vomiting, constipation, or diarrhea.  She has no urinary complaints.  Patient offers no further specific complaints today.  REVIEW OF SYSTEMS:   Review of Systems  Constitutional: Negative.  Negative for fever, malaise/fatigue and weight loss.  Respiratory: Negative.  Negative for cough and shortness of breath.   Cardiovascular: Negative.  Negative for chest pain and leg swelling.  Gastrointestinal: Negative.  Negative for abdominal pain.  Genitourinary: Negative.   Musculoskeletal: Positive for joint pain.  Skin: Negative for rash.  Neurological: Negative.  Negative for sensory change and weakness.  Psychiatric/Behavioral: The patient is nervous/anxious.     As per HPI. Otherwise, a complete review of systems is negative.  PAST MEDICAL HISTORY: Past Medical History:  Diagnosis Date  . Abnormal Pap smear of cervix    01/2015 ascus/neg- 04/2015 ascus/neg  . Allergy   . Anxiety   . Arthritis   . Asthma   . Chronic kidney disease   . COPD (chronic obstructive pulmonary disease) ([place])   . DDD (degenerative disc disease), lumbar   . Depression   . Elevated  lipids   . Fibromyalgia   . Fibromyalgia   . Genetic testing 03/09/2017   Multi-Cancer panel (83 genes) @ [facility] - No pathogenic mutations detected  . GERD (gastroesophageal reflux disease)   . History of IBS   . Hyperthyroidism   . Joint disease   . Migraine   . Migraine   . Oxygen deficiency   . Restless leg syndrome   . Sleep apnea    Does not use C-PAP, cannot tolerate mask    PAST SURGICAL HISTORY: Past Surgical History:  Procedure Laterality Date  . ABDOMINAL HYSTERECTOMY  2008  . BREAST BIOPSY Left 02/02/2017   [place] core path pending  . BREAST EXCISIONAL BIOPSY Left 02/17/2017   lumpectomy with nl sn  . BREAST LUMPECTOMY WITH NEEDLE LOCALIZATION Left 02/17/2017   Procedure: BREAST LUMPECTOMY WITH NEEDLE LOCALIZATION;  Surgeon: Vickie Epley, MD;  Location: [facility];  Service: General;  Laterality: Left;  . CARPAL TUNNEL RELEASE Bilateral   . cryotherapy    . DILATION AND CURETTAGE OF UTERUS    . ENDOMETRIAL ABLATION    . LAPAROSCOPIC OOPHERECTOMY Left    unsure which side but thinks its the left  . LYSIS OF ADHESION Left 10/14/2015   Procedure: LYSIS OF ADHESION;  Surgeon: Thornton Park, MD;  Location: [facility];  Service: Orthopedics;  Laterality: Left;  . NECK SURGERY     lower neck fusion rods and screws  . PORTA CATH INSERTION N/A 03/08/2017   Procedure: PORTA CATH INSERTION;  Surgeon: Katha Cabal, MD;  Location: [facility];  Service: Cardiovascular;  Laterality: N/A;  . RESECTION DISTAL CLAVICAL Left 10/14/2015   Procedure: RESECTION DISTAL CLAVICAL;  Surgeon: Thornton Park, MD;  Location: [facility];  Service: Orthopedics;  Laterality: Left;  . SENTINEL NODE BIOPSY Left 02/17/2017   Procedure: SENTINEL NODE BIOPSY;  Surgeon: Vickie Epley, MD;  Location: [facility];  Service: General;  Laterality: Left;  . SHOULDER ARTHROSCOPY WITH OPEN ROTATOR CUFF REPAIR Left 10/14/2015   Procedure: SHOULDER ARTHROSCOPY WITH OPEN ROTATOR CUFF REPAIR;   Surgeon: Thornton Park, MD;  Location: [facility];  Service: Orthopedics;  Laterality: Left;  . SHOULDER ARTHROSCOPY WITH OPEN ROTATOR CUFF REPAIR AND DISTAL CLAVICLE ACROMINECTOMY Right 09/03/2014   Procedure: RIGHT SHOULDER ARTHROSCOPY WITH MINI OPEN ROTATOR CUFF TEAR;  Surgeon: Thornton Park, MD;  Location: [facility];  Service: Orthopedics;  Laterality: Right;  biceps tenodesis, arthroscopic subacromial decompression and distal clavicle incision  . spinal injections    . SUBACROMIAL DECOMPRESSION Left 10/14/2015   Procedure: SUBACROMIAL DECOMPRESSION;  Surgeon: Thornton Park, MD;  Location: [facility];  Service: Orthopedics;  Laterality: Left;    FAMILY HISTORY: Family History  Problem Relation Age of Onset  . Breast cancer Mother 61       Glioblastoma also; deceased at 40  . Diabetes Father   . Lung cancer Father        mesothelioma; deceased 27s  . Cancer Maternal Aunt        "bone ca"; unk. primary  . Colon cancer Maternal Grandfather        dx in 3s; deceased 48  . Colon cancer Maternal Aunt        dx in 2s; currently 61s  . Lung cancer Maternal Grandmother        smoker; deceased 54  . Lung cancer Paternal Grandmother        smoker; deceased 18s  . Breast cancer Cousin        dx 36s; daughter of mat aunt with unk. primary cancer  . Cancer Other        distant cousin; unknown primary  . Colon cancer Other        dx 43s; currently 68; maternal half-sister    ADVANCED DIRECTIVES (Y/N):  N  HEALTH MAINTENANCE: Social History   Tobacco Use  . Smoking status: Current Every Day Smoker    Packs/day: 1.00    Types: Cigarettes  . Smokeless tobacco: Never Used  Substance Use Topics  . Alcohol use: No    Alcohol/week: 0.0 oz  . Drug use: No     Colonoscopy:  PAP:  Bone density:  Lipid panel:  Allergies  Allergen Reactions  . Gadolinium Derivatives Swelling and Other (See Comments)    NECK BECAME RED AND TONGUE WAS SWELLING SLIGHTLY PER PT NECK BECAME RED AND  TONGUE WAS SWELLING SLIGHTLY PER PT  . Iodine   . Bee Venom Hives and Rash  . Cefoxitin Rash  . Cefuroxime Axetil Hives and Rash  . Cucumber Extract Rash  . Iodinated Diagnostic Agents Hives and Rash  . Latex Rash  . Other Hives and Rash    Bee Stings-swelling/hives/rash Wool (textile fiber)-Rash/itching  . Tomato Rash    Red tomatoes    Current Outpatient Medications  Medication Sig Dispense Refill  . albuterol (ACCUNEB) 0.63 MG/3ML nebulizer solution Take 1 ampule by nebulization every 6 (six) hours as needed for wheezing.    . baclofen (LIORESAL) 10 MG tablet Take 10 mg by mouth 3 (three) times daily as needed for muscle  spasms (typically 2-3 times daily).     . cholecalciferol (VITAMIN D) 1000 UNITS tablet Take 1,000 Units by mouth 2 (two) times daily.     . diclofenac sodium (VOLTAREN) 1 % GEL Apply 2 g topically 4 (four) times daily as needed (for pain.).     Marland Kitchen EPINEPHrine (EPIPEN JR) 0.15 MG/0.3ML injection Inject 0.15 mg into the muscle as needed for anaphylaxis.    Marland Kitchen estrogen, conjugated,-medroxyprogesterone (PREMPRO) 0.3-1.5 MG per tablet Take 1 tablet by mouth every other day.     . hydrocortisone 2.5 % cream Apply 1 application topically 2 (two) times daily as needed (for itchy/dry skin.).    Marland Kitchen lidocaine-prilocaine (EMLA) cream Apply to affected area once 30 g 3  . linaclotide (LINZESS) 145 MCG CAPS capsule Take 145 mcg by mouth daily as needed (for constipation.).    Marland Kitchen LORazepam (ATIVAN) 0.5 MG tablet Take 0.5 mg by mouth 3 (three) times daily as needed for anxiety.     . magnesium oxide (MAG-OX) 400 MG tablet Take 400 mg by mouth daily. Reported on 07/08/2015    . Multiple Vitamins-Minerals (HAIR SKIN AND NAILS FORMULA) TABS Take 2 tablets by mouth daily.     Marland Kitchen omeprazole (PRILOSEC) 20 MG capsule Take 20 mg by mouth daily before breakfast.     . ondansetron (ZOFRAN) 8 MG tablet Take 1 tablet (8 mg total) by mouth 2 (two) times daily as needed (Nausea or vomiting). 30 tablet  2  . oxyCODONE 10 MG TABS Take 1 tablet (10 mg total) by mouth every 4 (four) hours as needed for breakthrough pain. 60 tablet 0  . prochlorperazine (COMPAZINE) 10 MG tablet Take 1 tablet (10 mg total) by mouth every 6 (six) hours as needed (Nausea or vomiting). 60 tablet 2  . promethazine (PHENERGAN) 25 MG tablet Take 25 mg by mouth every 6 (six) hours as needed for nausea or vomiting.    . rizatriptan (MAXALT-MLT) 10 MG disintegrating tablet Take 10 mg by mouth as needed for migraine. May repeat in 2 hours if needed    . rosuvastatin (CRESTOR) 40 MG tablet Take 40 mg by mouth at bedtime.     . topiramate (TOPAMAX) 100 MG tablet Take 200 mg by mouth daily.     . vitamin E (VITAMIN E) 200 UNIT capsule Take 200 Units by mouth 4 (four) times a week. Take 4-5 times a week     No current facility-administered medications for this visit.     OBJECTIVE: Vitals:   03/20/17 0853  BP: 90/67  Pulse: 96  Resp: 20  Temp: 97.6 F (36.4 C)     Body mass index is 25.9 kg/m.    ECOG FS:0 - Asymptomatic  General: Well-developed, well-nourished, no acute distress. Eyes: Pink conjunctiva, anicteric sclera. Breast: Patient requested exam be deferred today. Lungs: Clear to auscultation bilaterally. Heart: Regular rate and rhythm. No rubs, murmurs, or gallops. Abdomen: Soft, nontender, nondistended. No organomegaly noted, normoactive bowel sounds. Musculoskeletal: No edema, cyanosis, or clubbing. Neuro: Alert, answering all questions appropriately. Cranial nerves grossly intact. Skin: No rashes or petechiae noted. Psych: Normal affect.  LAB RESULTS:  Lab Results  Component Value Date   NA 137 03/20/2017   K 3.5 03/20/2017   CL 109 03/20/2017   CO2 20 (L) 03/20/2017   GLUCOSE 124 (H) 03/20/2017   BUN 25 (H) 03/20/2017   CREATININE 0.99 03/20/2017   CALCIUM 8.8 (L) 03/20/2017   PROT 6.6 03/20/2017   ALBUMIN 3.6 03/20/2017  AST 24 03/20/2017   ALT 20 03/20/2017   ALKPHOS 75 03/20/2017    BILITOT 0.4 03/20/2017   GFRNONAA >60 03/20/2017   GFRAA >60 03/20/2017    Lab Results  Component Value Date   WBC 12.1 (H) 03/20/2017   NEUTROABS 8.2 (H) 03/20/2017   HGB 13.0 03/20/2017   HCT 38.2 03/20/2017   MCV 98.8 03/20/2017   PLT 295 03/20/2017     STUDIES: Nm Cardiac Muga Rest  Result Date: 03/07/2017 CLINICAL DATA:  LEFT breast cancer, pre cardiotoxic chemotherapy EXAM: NUCLEAR MEDICINE CARDIAC BLOOD POOL IMAGING (MUGA) TECHNIQUE: Cardiac multi-gated acquisition was performed at rest following intravenous injection of Tc-74mlabeled red blood cells. RADIOPHARMACEUTICALS:  19.771 mCi Tc-959mertechnetate in-vitro labeled red blood cells IV COMPARISON:  None. FINDINGS: Normal LEFT ventricular ejection fraction of 54%. Study was obtained at a cardiac rate of 70 beats per minute. Patient was rhythmic during imaging. Cine analysis of the LEFT ventricle in 3 projections demonstrates normal LEFT ventricular wall motion. IMPRESSION: Normal LEFT ventricular ejection fraction of 54% with normal LV wall motion. Electronically Signed   By: MaLavonia Dana.D.   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  PLAN:    1. 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  She has also had port placement.  Proceed with cycle 2 of 12 of weekly Taxol and Herceptin, patient will then proceed with adjuvant XRT as well as Herceptin maintenance for 1 year. Finally, because patient has an ER/PR positive malignancy, she will benefit from an aromatase inhibitor for 5 years at the conclusion of all her treatments.  Return to clinic in 1 week for consideration of cycle 3 and then in 2 weeks for further evaluation and consideration of cycle 4. 2.  Genetic testing: Negative.  No  further interventions are needed. 3.  Joint pain: Continue symptomatic treatment as directed.  Approximately 30 minutes was spent in discussion of which greater than 50% was consultation.  Patient expressed understanding and was in agreement with this plan. She also understands that She can call clinic at any time with any questions, concerns, or complaints.   Cancer Staging Malignant neoplasm of upper-outer quadrant of left breast in female, estrogen receptor positive ([place] form: Breast, AJCC 8th Edition - Clinical stage from 02/07/2017: Stage IA (cT1c, cN0, cM0, G1, ER: Positive, PR: Positive, HER2: Positive) - Signed by FiLloyd HugerMD on 02/11/2017   TiLloyd HugerMD   03/20/2017 9:28 AM

## 2017-03-20 ENCOUNTER — Inpatient Hospital Stay: Payer: Medicare Other

## 2017-03-20 ENCOUNTER — Inpatient Hospital Stay (HOSPITAL_BASED_OUTPATIENT_CLINIC_OR_DEPARTMENT_OTHER): Payer: Medicare Other | Admitting: Oncology

## 2017-03-20 ENCOUNTER — Encounter: Payer: Self-pay | Admitting: *Deleted

## 2017-03-20 VITALS — BP 90/67 | HR 96 | Temp 97.6°F | Resp 20 | Wt 137.1 lb

## 2017-03-20 DIAGNOSIS — C50412 Malignant neoplasm of upper-outer quadrant of left female breast: Secondary | ICD-10-CM

## 2017-03-20 DIAGNOSIS — Z170 Estrogen receptor positive status [ER+]: Secondary | ICD-10-CM | POA: Diagnosis not present

## 2017-03-20 DIAGNOSIS — Z79899 Other long term (current) drug therapy: Secondary | ICD-10-CM | POA: Diagnosis not present

## 2017-03-20 DIAGNOSIS — F17210 Nicotine dependence, cigarettes, uncomplicated: Secondary | ICD-10-CM

## 2017-03-20 DIAGNOSIS — Z5111 Encounter for antineoplastic chemotherapy: Secondary | ICD-10-CM | POA: Diagnosis not present

## 2017-03-20 LAB — CBC WITH DIFFERENTIAL/PLATELET
BASOS PCT: 1 %
Basophils Absolute: 0.1 10*3/uL (ref 0–0.1)
EOS ABS: 0.5 10*3/uL (ref 0–0.7)
EOS PCT: 4 %
HCT: 38.2 % (ref 35.0–47.0)
HEMOGLOBIN: 13 g/dL (ref 12.0–16.0)
Lymphocytes Relative: 24 %
Lymphs Abs: 2.9 10*3/uL (ref 1.0–3.6)
MCH: 33.7 pg (ref 26.0–34.0)
MCHC: 34.1 g/dL (ref 32.0–36.0)
MCV: 98.8 fL (ref 80.0–100.0)
MONOS PCT: 3 %
Monocytes Absolute: 0.4 10*3/uL (ref 0.2–0.9)
Neutro Abs: 8.2 10*3/uL — ABNORMAL HIGH (ref 1.4–6.5)
Neutrophils Relative %: 68 %
PLATELETS: 295 10*3/uL (ref 150–440)
RBC: 3.87 MIL/uL (ref 3.80–5.20)
RDW: 13.5 % (ref 11.5–14.5)
WBC: 12.1 10*3/uL — ABNORMAL HIGH (ref 3.6–11.0)

## 2017-03-20 LAB — COMPREHENSIVE METABOLIC PANEL
ALK PHOS: 75 U/L (ref 38–126)
ALT: 20 U/L (ref 14–54)
AST: 24 U/L (ref 15–41)
Albumin: 3.6 g/dL (ref 3.5–5.0)
Anion gap: 8 (ref 5–15)
BUN: 25 mg/dL — AB (ref 6–20)
CALCIUM: 8.8 mg/dL — AB (ref 8.9–10.3)
CO2: 20 mmol/L — AB (ref 22–32)
CREATININE: 0.99 mg/dL (ref 0.44–1.00)
Chloride: 109 mmol/L (ref 101–111)
Glucose, Bld: 124 mg/dL — ABNORMAL HIGH (ref 65–99)
Potassium: 3.5 mmol/L (ref 3.5–5.1)
SODIUM: 137 mmol/L (ref 135–145)
Total Bilirubin: 0.4 mg/dL (ref 0.3–1.2)
Total Protein: 6.6 g/dL (ref 6.5–8.1)

## 2017-03-20 MED ORDER — SODIUM CHLORIDE 0.9% FLUSH
10.0000 mL | INTRAVENOUS | Status: DC | PRN
  Filled 2017-03-20: qty 10

## 2017-03-20 MED ORDER — SODIUM CHLORIDE 0.9 % IV SOLN: 10.0000 mg | Freq: Once | INTRAVENOUS | Status: DC

## 2017-03-20 MED ORDER — DEXAMETHASONE SODIUM PHOSPHATE 10 MG/ML IJ SOLN
10.0000 mg | Freq: Once | INTRAMUSCULAR | Status: AC
  Administered 2017-03-20: 10 mg via INTRAVENOUS
  Filled 2017-03-20: qty 1

## 2017-03-20 MED ORDER — HEPARIN SOD (PORK) LOCK FLUSH 100 UNIT/ML IV SOLN
500.0000 [IU] | Freq: Once | INTRAVENOUS | Status: AC | PRN
  Administered 2017-03-20: 500 [IU]
  Filled 2017-03-20: qty 5

## 2017-03-20 MED ORDER — SODIUM CHLORIDE 0.9 % IV SOLN
Freq: Once | INTRAVENOUS | Status: AC
  Administered 2017-03-20: 10:00:00 via INTRAVENOUS
  Filled 2017-03-20: qty 1000

## 2017-03-20 MED ORDER — SODIUM CHLORIDE 0.9 % IV SOLN
80.0000 mg/m2 | Freq: Once | INTRAVENOUS | Status: AC
  Administered 2017-03-20: 132 mg via INTRAVENOUS
  Filled 2017-03-20: qty 22

## 2017-03-20 MED ORDER — TRASTUZUMAB CHEMO 150 MG IV SOLR
2.0000 mg/kg | Freq: Once | INTRAVENOUS | Status: AC
  Administered 2017-03-20: 126 mg via INTRAVENOUS
  Filled 2017-03-20: qty 6

## 2017-03-20 MED ORDER — FAMOTIDINE IN NACL 20-0.9 MG/50ML-% IV SOLN: 20.0000 mg | Freq: Once | INTRAVENOUS | Status: DC

## 2017-03-20 MED ORDER — DIPHENHYDRAMINE HCL 50 MG/ML IJ SOLN
25.0000 mg | Freq: Once | INTRAMUSCULAR | Status: AC
  Administered 2017-03-20: 25 mg via INTRAVENOUS
  Filled 2017-03-20: qty 1

## 2017-03-20 MED ORDER — ACETAMINOPHEN 325 MG PO TABS
650.0000 mg | ORAL_TABLET | Freq: Once | ORAL | Status: AC
  Administered 2017-03-20: 650 mg via ORAL
  Filled 2017-03-20: qty 2

## 2017-03-20 MED ORDER — SODIUM CHLORIDE 0.9 % IV SOLN
20.0000 mg | Freq: Once | INTRAVENOUS | Status: AC
  Administered 2017-03-20: 20 mg via INTRAVENOUS
  Filled 2017-03-20: qty 100

## 2017-03-20 NOTE — Progress Notes (Signed)
Patient here today for follow up and treatment consideration regarding breast cancer. Patient states she tolerated treatment well last week.

## 2017-03-20 NOTE — Progress Notes (Signed)
  Oncology Nurse Navigator Documentation  Navigator Location: [facility] (03/20/17 1000)   )Navigator Encounter Type: Clinic/MDC (03/20/17 1000)                       Treatment Phase: Active Tx (03/20/17 1000) Barriers/Navigation Needs: Financial (03/20/17 1000)                          Time Spent with Patient: 15 (03/20/17 1000)   Met patient today prior to her chemotherapy.  She  Tolerated her first treatment well.  She states she is applying for a [place].  I told her [place] would also be calling her today to discuss financial assistance programs available here.

## 2017-03-28 ENCOUNTER — Encounter: Payer: Self-pay | Admitting: *Deleted

## 2017-03-28 ENCOUNTER — Inpatient Hospital Stay: Payer: Medicare Other

## 2017-03-28 DIAGNOSIS — E0590 Thyrotoxicosis, unspecified without thyrotoxic crisis or storm: Secondary | ICD-10-CM

## 2017-03-28 DIAGNOSIS — Z5111 Encounter for antineoplastic chemotherapy: Secondary | ICD-10-CM | POA: Diagnosis not present

## 2017-03-28 DIAGNOSIS — C50412 Malignant neoplasm of upper-outer quadrant of left female breast: Secondary | ICD-10-CM

## 2017-03-28 DIAGNOSIS — E785 Hyperlipidemia, unspecified: Secondary | ICD-10-CM

## 2017-03-28 DIAGNOSIS — Z170 Estrogen receptor positive status [ER+]: Secondary | ICD-10-CM

## 2017-03-28 LAB — COMPREHENSIVE METABOLIC PANEL
ALBUMIN: 3.7 g/dL (ref 3.5–5.0)
ALT: 26 U/L (ref 14–54)
AST: 22 U/L (ref 15–41)
Alkaline Phosphatase: 63 U/L (ref 38–126)
Anion gap: 5 (ref 5–15)
BILIRUBIN TOTAL: 0.3 mg/dL (ref 0.3–1.2)
BUN: 27 mg/dL — AB (ref 6–20)
CHLORIDE: 110 mmol/L (ref 101–111)
CO2: 22 mmol/L (ref 22–32)
CREATININE: 0.91 mg/dL (ref 0.44–1.00)
Calcium: 9.1 mg/dL (ref 8.9–10.3)
GFR calc Af Amer: >60 mL/min (ref >60)
GLUCOSE: 99 mg/dL (ref 65–99)
Potassium: 3.7 mmol/L (ref 3.5–5.1)
Sodium: 137 mmol/L (ref 135–145)
TOTAL PROTEIN: 6.8 g/dL (ref 6.5–8.1)

## 2017-03-28 LAB — LIPID PANEL
Cholesterol: 120 mg/dL (ref 0–200)
HDL: 46 mg/dL (ref >40)
LDL CALC: 56 mg/dL (ref 0–99)
Total CHOL/HDL Ratio: 2.6 RATIO
Triglycerides: 90 mg/dL (ref <150)
VLDL: 18 mg/dL (ref 0–40)

## 2017-03-28 LAB — CBC WITH DIFFERENTIAL/PLATELET
BASOS ABS: 0.1 10*3/uL (ref 0–0.1)
BASOS PCT: 1 %
Eosinophils Absolute: 0.2 10*3/uL (ref 0–0.7)
Eosinophils Relative: 3 %
HEMATOCRIT: 36.9 % (ref 35.0–47.0)
Hemoglobin: 12.5 g/dL (ref 12.0–16.0)
LYMPHS PCT: 35 %
Lymphs Abs: 2.6 10*3/uL (ref 1.0–3.6)
MCH: 33.9 pg (ref 26.0–34.0)
MCHC: 33.9 g/dL (ref 32.0–36.0)
MCV: 100 fL (ref 80.0–100.0)
MONO ABS: 0.5 10*3/uL (ref 0.2–0.9)
Monocytes Relative: 6 %
NEUTROS ABS: 4.1 10*3/uL (ref 1.4–6.5)
NEUTROS PCT: 55 %
Platelets: 333 10*3/uL (ref 150–440)
RBC: 3.69 MIL/uL — AB (ref 3.80–5.20)
RDW: 14.1 % (ref 11.5–14.5)
WBC: 7.5 10*3/uL (ref 3.6–11.0)

## 2017-03-28 LAB — TSH: TSH: 2.993 u[IU]/mL (ref 0.350–4.500)

## 2017-03-28 MED ORDER — TRASTUZUMAB CHEMO 150 MG IV SOLR
2.0000 mg/kg | Freq: Once | INTRAVENOUS | Status: AC
  Administered 2017-03-28: 126 mg via INTRAVENOUS
  Filled 2017-03-28: qty 6

## 2017-03-28 MED ORDER — HEPARIN SOD (PORK) LOCK FLUSH 100 UNIT/ML IV SOLN
500.0000 [IU] | Freq: Once | INTRAVENOUS | Status: AC
  Administered 2017-03-28: 500 [IU] via INTRAVENOUS

## 2017-03-28 MED ORDER — FAMOTIDINE IN NACL 20-0.9 MG/50ML-% IV SOLN
20.0000 mg | Freq: Once | INTRAVENOUS | Status: DC
Start: 2017-03-28 — End: 2017-03-28

## 2017-03-28 MED ORDER — SODIUM CHLORIDE 0.9 % IV SOLN
Freq: Once | INTRAVENOUS | Status: AC
  Administered 2017-03-28: 10:00:00 via INTRAVENOUS
  Filled 2017-03-28: qty 1000

## 2017-03-28 MED ORDER — DIPHENHYDRAMINE HCL 50 MG/ML IJ SOLN
25.0000 mg | Freq: Once | INTRAMUSCULAR | Status: AC
  Administered 2017-03-28: 25 mg via INTRAVENOUS
  Filled 2017-03-28: qty 1

## 2017-03-28 MED ORDER — SODIUM CHLORIDE 0.9 % IV SOLN: 10.0000 mg | Freq: Once | INTRAVENOUS | Status: DC

## 2017-03-28 MED ORDER — DEXAMETHASONE SODIUM PHOSPHATE 10 MG/ML IJ SOLN
10.0000 mg | Freq: Once | INTRAMUSCULAR | Status: AC
  Administered 2017-03-28: 10 mg via INTRAVENOUS
  Filled 2017-03-28: qty 1

## 2017-03-28 MED ORDER — SODIUM CHLORIDE 0.9 % IV SOLN
Freq: Once | INTRAVENOUS | Status: AC
  Administered 2017-03-28: 11:00:00 via INTRAVENOUS
  Filled 2017-03-28: qty 100

## 2017-03-28 MED ORDER — SODIUM CHLORIDE 0.9 % IV SOLN
80.0000 mg/m2 | Freq: Once | INTRAVENOUS | Status: AC
  Administered 2017-03-28: 132 mg via INTRAVENOUS
  Filled 2017-03-28: qty 22

## 2017-03-28 MED ORDER — ACETAMINOPHEN 325 MG PO TABS
650.0000 mg | ORAL_TABLET | Freq: Once | ORAL | Status: AC
  Administered 2017-03-28: 650 mg via ORAL
  Filled 2017-03-28: qty 2

## 2017-03-28 NOTE — Progress Notes (Signed)
  Oncology Nurse Navigator Documentation  Navigator Location: [facility] (03/28/17 1100)   )Navigator Encounter Type: Clinic/MDC (03/28/17 1100)                       Treatment Phase: Active Tx (03/28/17 1100)                            Time Spent with Patient: 15 (03/28/17 1100)   Met patient while getting chemotherapy.  She is tolerating treatments well.  She wants to get a massage.  Inbasket message sent to [facility] to schedule her a free massage.

## 2017-03-31 ENCOUNTER — Other Ambulatory Visit: Payer: Self-pay | Admitting: Oncology

## 2017-04-01 NOTE — Progress Notes (Signed)
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  INTERVAL HISTORY: Patient returns to clinic today for further evaluation and consideration of cycle 4 of 12 of weekly Herceptin and Taxol. She continues to be anxious, but otherwise feels well.  She has no neurologic complaints.  She denies any recent fevers or illnesses.  She has a good appetite and denies weight loss.  She has no chest pain or shortness of breath.  She denies any nausea, vomiting, constipation, or diarrhea.  She has no urinary complaints.  Patient offers no further specific complaints today.  REVIEW OF SYSTEMS:   Review of Systems  Constitutional: Negative.  Negative for fever, malaise/fatigue and weight loss.  Respiratory: Negative.  Negative for cough and shortness of breath.   Cardiovascular: Negative.  Negative for chest pain and leg swelling.  Gastrointestinal: Negative.  Negative for abdominal pain.  Genitourinary: Negative.   Musculoskeletal: Positive for joint pain.  Skin: Negative for rash.  Neurological: Negative.  Negative for sensory change and weakness.  Psychiatric/Behavioral: The patient is nervous/anxious.     As per HPI. Otherwise, a complete review of systems is negative.  PAST MEDICAL HISTORY: Past Medical History:  Diagnosis Date  . Abnormal Pap smear of cervix    01/2015 ascus/neg- 04/2015 ascus/neg  . Allergy   . Anxiety   . Arthritis   . Asthma   . Chronic kidney disease   . COPD (chronic obstructive pulmonary disease) ([place])   . DDD (degenerative disc disease), lumbar   . Depression   . Elevated lipids   . Fibromyalgia   . Fibromyalgia   . Genetic testing 03/09/2017   Multi-Cancer panel (83 genes) @ [facility] - No pathogenic mutations detected  . GERD (gastroesophageal reflux disease)   . History of IBS   . Hyperthyroidism   . Joint disease   . Migraine   . Migraine   . Oxygen deficiency   . Restless leg syndrome   . Sleep apnea    Does not use C-PAP, cannot tolerate mask    PAST SURGICAL HISTORY: Past Surgical History:  Procedure Laterality Date  . ABDOMINAL HYSTERECTOMY  2008  . BREAST BIOPSY Left 02/02/2017   [place] core path pending  . BREAST EXCISIONAL BIOPSY Left 02/17/2017   lumpectomy with nl sn  . BREAST LUMPECTOMY WITH NEEDLE LOCALIZATION Left 02/17/2017   Procedure: BREAST LUMPECTOMY WITH NEEDLE LOCALIZATION;  Surgeon: Vickie Epley, MD;  Location: [facility];  Service: General;  Laterality: Left;  . CARPAL TUNNEL RELEASE Bilateral   . cryotherapy    . DILATION AND CURETTAGE OF UTERUS    . ENDOMETRIAL ABLATION    . LAPAROSCOPIC OOPHERECTOMY Left    unsure which side but thinks its the left  . LYSIS OF ADHESION Left 10/14/2015   Procedure: LYSIS OF ADHESION;  Surgeon: Thornton Park, MD;  Location: [facility];  Service: Orthopedics;  Laterality: Left;  . NECK SURGERY     lower neck fusion rods and screws  . PORTA CATH INSERTION N/A 03/08/2017   Procedure: PORTA CATH INSERTION;  Surgeon: Katha Cabal, MD;  Location: [facility];  Service: Cardiovascular;  Laterality: N/A;  . RESECTION DISTAL CLAVICAL Left 10/14/2015   Procedure: RESECTION DISTAL  CLAVICAL;  Surgeon: Thornton Park, MD;  Location: [facility];  Service: Orthopedics;  Laterality: Left;  . SENTINEL NODE BIOPSY Left 02/17/2017   Procedure: SENTINEL NODE BIOPSY;  Surgeon: Vickie Epley, MD;  Location: [facility];  Service: General;  Laterality: Left;  . SHOULDER ARTHROSCOPY WITH OPEN ROTATOR CUFF REPAIR Left 10/14/2015   Procedure: SHOULDER ARTHROSCOPY WITH OPEN ROTATOR CUFF REPAIR;  Surgeon: Thornton Park, MD;  Location: [facility];  Service: Orthopedics;   Laterality: Left;  . SHOULDER ARTHROSCOPY WITH OPEN ROTATOR CUFF REPAIR AND DISTAL CLAVICLE ACROMINECTOMY Right 09/03/2014   Procedure: RIGHT SHOULDER ARTHROSCOPY WITH MINI OPEN ROTATOR CUFF TEAR;  Surgeon: Thornton Park, MD;  Location: [facility];  Service: Orthopedics;  Laterality: Right;  biceps tenodesis, arthroscopic subacromial decompression and distal clavicle incision  . spinal injections    . SUBACROMIAL DECOMPRESSION Left 10/14/2015   Procedure: SUBACROMIAL DECOMPRESSION;  Surgeon: Thornton Park, MD;  Location: [facility];  Service: Orthopedics;  Laterality: Left;    FAMILY HISTORY: Family History  Problem Relation Age of Onset  . Breast cancer Mother 42       Glioblastoma also; deceased at 41  . Diabetes Father   . Lung cancer Father        mesothelioma; deceased 42s  . Cancer Maternal Aunt        "bone ca"; unk. primary  . Colon cancer Maternal Grandfather        dx in 38s; deceased 2  . Colon cancer Maternal Aunt        dx in 46s; currently 50s  . Lung cancer Maternal Grandmother        smoker; deceased 43  . Lung cancer Paternal Grandmother        smoker; deceased 62s  . Breast cancer Cousin        dx 67s; daughter of mat aunt with unk. primary cancer  . Cancer Other        distant cousin; unknown primary  . Colon cancer Other        dx 59s; currently 77; maternal half-sister    ADVANCED DIRECTIVES (Y/N):  N  HEALTH MAINTENANCE: Social History   Tobacco Use  . Smoking status: Current Every Day Smoker    Packs/day: 1.00    Types: Cigarettes  . Smokeless tobacco: Never Used  Substance Use Topics  . Alcohol use: No    Alcohol/week: 0.0 oz  . Drug use: No     Colonoscopy:  PAP:  Bone density:  Lipid panel:  Allergies  Allergen Reactions  . Gadolinium Derivatives Swelling and Other (See Comments)    NECK BECAME RED AND TONGUE WAS SWELLING SLIGHTLY PER PT NECK BECAME RED AND TONGUE WAS SWELLING SLIGHTLY PER PT  . Iodine   . Bee Venom Hives and Rash    . Cefoxitin Rash  . Cefuroxime Axetil Hives and Rash  . Cucumber Extract Rash  . Iodinated Diagnostic Agents Hives and Rash  . Latex Rash  . Other Hives and Rash    Bee Stings-swelling/hives/rash Wool (textile fiber)-Rash/itching  . Tomato Rash    Red tomatoes    Current Outpatient Medications  Medication Sig Dispense Refill  . albuterol (ACCUNEB) 0.63 MG/3ML nebulizer solution Take 1 ampule by nebulization every 6 (six) hours as needed for wheezing.    . baclofen (LIORESAL) 10 MG tablet Take 10 mg by mouth 3 (three) times daily as needed for muscle spasms (typically 2-3 times daily).     . cholecalciferol (VITAMIN D) 1000 UNITS tablet  Take 1,000 Units by mouth 2 (two) times daily.     . diclofenac sodium (VOLTAREN) 1 % GEL Apply 2 g topically 4 (four) times daily as needed (for pain.).     Marland Kitchen EPINEPHrine (EPIPEN JR) 0.15 MG/0.3ML injection Inject 0.15 mg into the muscle as needed for anaphylaxis.    Marland Kitchen estrogen, conjugated,-medroxyprogesterone (PREMPRO) 0.3-1.5 MG per tablet Take 1 tablet by mouth every other day.     . hydrocortisone 2.5 % cream Apply 1 application topically 2 (two) times daily as needed (for itchy/dry skin.).    Marland Kitchen lidocaine-prilocaine (EMLA) cream Apply to affected area once 30 g 3  . linaclotide (LINZESS) 145 MCG CAPS capsule Take 145 mcg by mouth daily as needed (for constipation.).    Marland Kitchen LORazepam (ATIVAN) 0.5 MG tablet Take 0.5 mg by mouth 3 (three) times daily as needed for anxiety.     . magnesium oxide (MAG-OX) 400 MG tablet Take 400 mg by mouth daily. Reported on 07/08/2015    . Multiple Vitamins-Minerals (HAIR SKIN AND NAILS FORMULA) TABS Take 2 tablets by mouth daily.     Marland Kitchen omeprazole (PRILOSEC) 20 MG capsule Take 20 mg by mouth daily before breakfast.     . ondansetron (ZOFRAN) 8 MG tablet Take 1 tablet (8 mg total) by mouth 2 (two) times daily as needed (Nausea or vomiting). 30 tablet 2  . oxyCODONE 10 MG TABS Take 1 tablet (10 mg total) by mouth every 4  (four) hours as needed for breakthrough pain. 60 tablet 0  . prochlorperazine (COMPAZINE) 10 MG tablet Take 1 tablet (10 mg total) by mouth every 6 (six) hours as needed (Nausea or vomiting). 60 tablet 2  . promethazine (PHENERGAN) 25 MG tablet Take 25 mg by mouth every 6 (six) hours as needed for nausea or vomiting.    . rizatriptan (MAXALT-MLT) 10 MG disintegrating tablet Take 10 mg by mouth as needed for migraine. May repeat in 2 hours if needed    . rosuvastatin (CRESTOR) 40 MG tablet Take 40 mg by mouth at bedtime.     . topiramate (TOPAMAX) 100 MG tablet Take 200 mg by mouth daily.     . vitamin E (VITAMIN E) 200 UNIT capsule Take 200 Units by mouth 4 (four) times a week. Take 4-5 times a week     No current facility-administered medications for this visit.     OBJECTIVE: Vitals:   04/04/17 0901  BP: 97/70  Pulse: 79  Resp: 20  Temp: (!) 97.4 F (36.3 C)     Body mass index is 26.6 kg/m.    ECOG FS:0 - Asymptomatic  General: Well-developed, well-nourished, no acute distress. Eyes: Pink conjunctiva, anicteric sclera. Breast: Patient requested exam be deferred today. Lungs: Clear to auscultation bilaterally. Heart: Regular rate and rhythm. No rubs, murmurs, or gallops. Abdomen: Soft, nontender, nondistended. No organomegaly noted, normoactive bowel sounds. Musculoskeletal: No edema, cyanosis, or clubbing. Neuro: Alert, answering all questions appropriately. Cranial nerves grossly intact. Skin: No rashes or petechiae noted. Psych: Normal affect.  LAB RESULTS:  Lab Results  Component Value Date   NA 138 04/04/2017   K 3.7 04/04/2017   CL 110 04/04/2017   CO2 20 (L) 04/04/2017   GLUCOSE 112 (H) 04/04/2017   BUN 18 04/04/2017   CREATININE 0.85 04/04/2017   CALCIUM 8.9 04/04/2017   PROT 6.6 04/04/2017   ALBUMIN 3.6 04/04/2017   AST 21 04/04/2017   ALT 22 04/04/2017   ALKPHOS 68 04/04/2017   BILITOT <0.1 (  L) 04/04/2017   GFRNONAA >60 04/04/2017   GFRAA >60 04/04/2017     Lab Results  Component Value Date   WBC 8.1 04/04/2017   NEUTROABS 4.4 04/04/2017   HGB 12.6 04/04/2017   HCT 36.5 04/04/2017   MCV 99.2 04/04/2017   PLT 329 04/04/2017     STUDIES: Nm Cardiac Muga Rest  Result Date: 03/07/2017 CLINICAL DATA:  LEFT breast cancer, pre cardiotoxic chemotherapy EXAM: NUCLEAR MEDICINE CARDIAC BLOOD POOL IMAGING (MUGA) TECHNIQUE: Cardiac multi-gated acquisition was performed at rest following intravenous injection of Tc-14mlabeled red blood cells. RADIOPHARMACEUTICALS:  19.771 mCi Tc-962mertechnetate in-vitro labeled red blood cells IV COMPARISON:  None. FINDINGS: Normal LEFT ventricular ejection fraction of 54%. Study was obtained at a cardiac rate of 70 beats per minute. Patient was rhythmic during imaging. Cine analysis of the LEFT ventricle in 3 projections demonstrates normal LEFT ventricular wall motion. IMPRESSION: Normal LEFT ventricular ejection fraction of 54% with normal LV wall motion. Electronically Signed   By: MaLavonia Dana.D.   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  PLAN:    1. 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  She has also had port placement.  Proceed with cycle 4 of 12 of weekly Taxol and Herceptin today.  At the conclusion of patient's chemotherapy, she will then proceed with adjuvant XRT as well as Herceptin maintenance for 1 year. Finally, because patient has an ER/PR positive malignancy, she will benefit from an aromatase inhibitor for 5 years at the conclusion of all her treatments.  Return to clinic in 1 week for consideration of cycle 5 and then in 2 weeks for further evaluation and consideration of cycle 6. 2.  Genetic testing: Negative.  No further interventions are  needed. 3.  Joint pain: Continue symptomatic treatment as directed. 4.  Anxiety: Patient was given a referral to psychiatry for further evaluation and adjustment of her medications as needed. 5.  Chronic pain: Continue appointments with pain clinic as scheduled.   Patient expressed understanding and was in agreement with this plan. She also understands that She can call clinic at any time with any questions, concerns, or complaints.   Cancer Staging Malignant neoplasm of upper-outer quadrant of left breast in female, estrogen receptor positive ([place] form: Breast, AJCC 8th Edition - Clinical stage from 02/07/2017: Stage IA (cT1c, cN0, cM0, G1, ER: Positive, PR: Positive, HER2: Positive) - Signed by FiLloyd HugerMD on 02/11/2017   TiLloyd HugerMD   04/06/2017 10:39 AM

## 2017-04-04 ENCOUNTER — Inpatient Hospital Stay: Payer: Medicare Other | Attending: Oncology

## 2017-04-04 ENCOUNTER — Encounter: Payer: Self-pay | Admitting: Oncology

## 2017-04-04 ENCOUNTER — Inpatient Hospital Stay (HOSPITAL_BASED_OUTPATIENT_CLINIC_OR_DEPARTMENT_OTHER): Payer: Medicare Other | Admitting: Oncology

## 2017-04-04 ENCOUNTER — Inpatient Hospital Stay: Payer: Medicare Other

## 2017-04-04 VITALS — BP 97/70 | HR 79 | Temp 97.4°F | Resp 20 | Wt 140.8 lb

## 2017-04-04 DIAGNOSIS — Z170 Estrogen receptor positive status [ER+]: Secondary | ICD-10-CM

## 2017-04-04 DIAGNOSIS — Z79899 Other long term (current) drug therapy: Secondary | ICD-10-CM | POA: Insufficient documentation

## 2017-04-04 DIAGNOSIS — C50412 Malignant neoplasm of upper-outer quadrant of left female breast: Secondary | ICD-10-CM | POA: Diagnosis not present

## 2017-04-04 DIAGNOSIS — Z5111 Encounter for antineoplastic chemotherapy: Secondary | ICD-10-CM | POA: Diagnosis present

## 2017-04-04 DIAGNOSIS — F419 Anxiety disorder, unspecified: Secondary | ICD-10-CM

## 2017-04-04 DIAGNOSIS — E860 Dehydration: Secondary | ICD-10-CM | POA: Diagnosis not present

## 2017-04-04 DIAGNOSIS — R42 Dizziness and giddiness: Secondary | ICD-10-CM | POA: Insufficient documentation

## 2017-04-04 DIAGNOSIS — F411 Generalized anxiety disorder: Secondary | ICD-10-CM

## 2017-04-04 LAB — CBC WITH DIFFERENTIAL/PLATELET
Basophils Absolute: 0.1 10*3/uL (ref 0–0.1)
Basophils Relative: 1 %
Eosinophils Absolute: 0.2 10*3/uL (ref 0–0.7)
Eosinophils Relative: 3 %
HEMATOCRIT: 36.5 % (ref 35.0–47.0)
HEMOGLOBIN: 12.6 g/dL (ref 12.0–16.0)
LYMPHS ABS: 3 10*3/uL (ref 1.0–3.6)
LYMPHS PCT: 38 %
MCH: 34.4 pg — AB (ref 26.0–34.0)
MCHC: 34.6 g/dL (ref 32.0–36.0)
MCV: 99.2 fL (ref 80.0–100.0)
MONOS PCT: 5 %
Monocytes Absolute: 0.4 10*3/uL (ref 0.2–0.9)
NEUTROS PCT: 53 %
Neutro Abs: 4.4 10*3/uL (ref 1.4–6.5)
Platelets: 329 10*3/uL (ref 150–440)
RBC: 3.68 MIL/uL — AB (ref 3.80–5.20)
RDW: 14.9 % — ABNORMAL HIGH (ref 11.5–14.5)
WBC: 8.1 10*3/uL (ref 3.6–11.0)

## 2017-04-04 LAB — COMPREHENSIVE METABOLIC PANEL
ALT: 22 U/L (ref 14–54)
AST: 21 U/L (ref 15–41)
Albumin: 3.6 g/dL (ref 3.5–5.0)
Alkaline Phosphatase: 68 U/L (ref 38–126)
Anion gap: 8 (ref 5–15)
BUN: 18 mg/dL (ref 6–20)
CHLORIDE: 110 mmol/L (ref 101–111)
CO2: 20 mmol/L — AB (ref 22–32)
CREATININE: 0.85 mg/dL (ref 0.44–1.00)
Calcium: 8.9 mg/dL (ref 8.9–10.3)
GFR calc non Af Amer: >60 mL/min (ref >60)
Glucose, Bld: 112 mg/dL — ABNORMAL HIGH (ref 65–99)
POTASSIUM: 3.7 mmol/L (ref 3.5–5.1)
SODIUM: 138 mmol/L (ref 135–145)
Total Bilirubin: <0.1 mg/dL — ABNORMAL LOW (ref 0.3–1.2)
Total Protein: 6.6 g/dL (ref 6.5–8.1)

## 2017-04-04 MED ORDER — SODIUM CHLORIDE 0.9 % IV SOLN
20.0000 mg | Freq: Once | INTRAVENOUS | Status: AC
  Administered 2017-04-04: 20 mg via INTRAVENOUS
  Filled 2017-04-04: qty 100

## 2017-04-04 MED ORDER — FAMOTIDINE IN NACL 20-0.9 MG/50ML-% IV SOLN: 20.0000 mg | Freq: Once | INTRAVENOUS | Status: DC

## 2017-04-04 MED ORDER — HEPARIN SOD (PORK) LOCK FLUSH 100 UNIT/ML IV SOLN
500.0000 [IU] | Freq: Once | INTRAVENOUS | Status: AC
  Administered 2017-04-04: 500 [IU] via INTRAVENOUS
  Filled 2017-04-04: qty 5

## 2017-04-04 MED ORDER — DIPHENHYDRAMINE HCL 50 MG/ML IJ SOLN
25.0000 mg | Freq: Once | INTRAMUSCULAR | Status: AC
  Administered 2017-04-04: 25 mg via INTRAVENOUS
  Filled 2017-04-04: qty 1

## 2017-04-04 MED ORDER — SODIUM CHLORIDE 0.9 % IV SOLN
80.0000 mg/m2 | Freq: Once | INTRAVENOUS | Status: AC
  Administered 2017-04-04: 132 mg via INTRAVENOUS
  Filled 2017-04-04: qty 22

## 2017-04-04 MED ORDER — SODIUM CHLORIDE 0.9% FLUSH
10.0000 mL | INTRAVENOUS | Status: DC | PRN
  Administered 2017-04-04: 10 mL via INTRAVENOUS
  Filled 2017-04-04: qty 10

## 2017-04-04 MED ORDER — DEXAMETHASONE SODIUM PHOSPHATE 10 MG/ML IJ SOLN
10.0000 mg | Freq: Once | INTRAMUSCULAR | Status: AC
  Administered 2017-04-04: 10 mg via INTRAVENOUS
  Filled 2017-04-04: qty 1

## 2017-04-04 MED ORDER — TRASTUZUMAB CHEMO 150 MG IV SOLR
2.0000 mg/kg | Freq: Once | INTRAVENOUS | Status: AC
  Administered 2017-04-04: 126 mg via INTRAVENOUS
  Filled 2017-04-04: qty 6

## 2017-04-04 MED ORDER — SODIUM CHLORIDE 0.9 % IV SOLN
Freq: Once | INTRAVENOUS | Status: AC
  Administered 2017-04-04: 10:00:00 via INTRAVENOUS
  Filled 2017-04-04: qty 1000

## 2017-04-04 MED ORDER — ACETAMINOPHEN 325 MG PO TABS
650.0000 mg | ORAL_TABLET | Freq: Once | ORAL | Status: AC
  Administered 2017-04-04: 650 mg via ORAL
  Filled 2017-04-04: qty 2

## 2017-04-04 NOTE — Progress Notes (Signed)
Patient denies any concerns today.  

## 2017-04-10 ENCOUNTER — Inpatient Hospital Stay: Payer: Medicare Other

## 2017-04-10 ENCOUNTER — Inpatient Hospital Stay (HOSPITAL_BASED_OUTPATIENT_CLINIC_OR_DEPARTMENT_OTHER): Payer: Medicare Other | Admitting: Oncology

## 2017-04-10 VITALS — BP 102/67 | HR 86 | Temp 97.0°F | Resp 16

## 2017-04-10 DIAGNOSIS — C50412 Malignant neoplasm of upper-outer quadrant of left female breast: Secondary | ICD-10-CM | POA: Diagnosis not present

## 2017-04-10 DIAGNOSIS — F419 Anxiety disorder, unspecified: Secondary | ICD-10-CM

## 2017-04-10 DIAGNOSIS — Z170 Estrogen receptor positive status [ER+]: Secondary | ICD-10-CM

## 2017-04-10 DIAGNOSIS — R42 Dizziness and giddiness: Secondary | ICD-10-CM

## 2017-04-10 DIAGNOSIS — L308 Other specified dermatitis: Secondary | ICD-10-CM

## 2017-04-10 DIAGNOSIS — G894 Chronic pain syndrome: Secondary | ICD-10-CM

## 2017-04-10 DIAGNOSIS — Z79899 Other long term (current) drug therapy: Secondary | ICD-10-CM | POA: Diagnosis not present

## 2017-04-10 DIAGNOSIS — Z5111 Encounter for antineoplastic chemotherapy: Secondary | ICD-10-CM | POA: Diagnosis not present

## 2017-04-10 LAB — CBC WITH DIFFERENTIAL/PLATELET
Basophils Absolute: 0.1 10*3/uL (ref 0–0.1)
Basophils Relative: 1 %
Eosinophils Absolute: 0.1 10*3/uL (ref 0–0.7)
Eosinophils Relative: 2 %
HCT: 38 % (ref 35.0–47.0)
HEMOGLOBIN: 13.2 g/dL (ref 12.0–16.0)
LYMPHS ABS: 2.5 10*3/uL (ref 1.0–3.6)
Lymphocytes Relative: 32 %
MCH: 34.4 pg — AB (ref 26.0–34.0)
MCHC: 34.7 g/dL (ref 32.0–36.0)
MCV: 99.2 fL (ref 80.0–100.0)
MONO ABS: 0.3 10*3/uL (ref 0.2–0.9)
MONOS PCT: 4 %
NEUTROS PCT: 61 %
Neutro Abs: 4.8 10*3/uL (ref 1.4–6.5)
Platelets: 328 10*3/uL (ref 150–440)
RBC: 3.83 MIL/uL (ref 3.80–5.20)
RDW: 14.8 % — AB (ref 11.5–14.5)
WBC: 7.7 10*3/uL (ref 3.6–11.0)

## 2017-04-10 LAB — COMPREHENSIVE METABOLIC PANEL
ALBUMIN: 3.8 g/dL (ref 3.5–5.0)
ALK PHOS: 69 U/L (ref 38–126)
ALT: 30 U/L (ref 14–54)
AST: 25 U/L (ref 15–41)
Anion gap: 10 (ref 5–15)
BUN: 27 mg/dL — AB (ref 6–20)
CALCIUM: 9 mg/dL (ref 8.9–10.3)
CO2: 20 mmol/L — AB (ref 22–32)
Chloride: 107 mmol/L (ref 101–111)
Creatinine, Ser: 0.91 mg/dL (ref 0.44–1.00)
GFR calc non Af Amer: >60 mL/min (ref >60)
Glucose, Bld: 116 mg/dL — ABNORMAL HIGH (ref 65–99)
Potassium: 3.5 mmol/L (ref 3.5–5.1)
SODIUM: 137 mmol/L (ref 135–145)
Total Bilirubin: 0.2 mg/dL — ABNORMAL LOW (ref 0.3–1.2)
Total Protein: 7.1 g/dL (ref 6.5–8.1)

## 2017-04-10 MED ORDER — ACETAMINOPHEN 325 MG PO TABS
650.0000 mg | ORAL_TABLET | Freq: Once | ORAL | Status: AC
  Administered 2017-04-10: 650 mg via ORAL
  Filled 2017-04-10: qty 2

## 2017-04-10 MED ORDER — HEPARIN SOD (PORK) LOCK FLUSH 100 UNIT/ML IV SOLN
500.0000 [IU] | Freq: Once | INTRAVENOUS | Status: AC
  Filled 2017-04-10: qty 5

## 2017-04-10 MED ORDER — HEPARIN SOD (PORK) LOCK FLUSH 100 UNIT/ML IV SOLN
500.0000 [IU] | Freq: Once | INTRAVENOUS | Status: AC | PRN
  Administered 2017-04-10: 500 [IU]

## 2017-04-10 MED ORDER — SODIUM CHLORIDE 0.9 % IV SOLN
INTRAVENOUS | Status: DC
  Administered 2017-04-10: 11:00:00 via INTRAVENOUS
  Filled 2017-04-10 (×2): qty 100

## 2017-04-10 MED ORDER — NYSTATIN 100000 UNIT/GM EX POWD: Freq: Four times a day (QID) | CUTANEOUS | 0 refills | Status: DC

## 2017-04-10 MED ORDER — SODIUM CHLORIDE 0.9 % IV SOLN
Freq: Once | INTRAVENOUS | Status: AC
  Administered 2017-04-10: 10:00:00 via INTRAVENOUS
  Filled 2017-04-10: qty 1000

## 2017-04-10 MED ORDER — TRASTUZUMAB CHEMO 150 MG IV SOLR
2.0000 mg/kg | Freq: Once | INTRAVENOUS | Status: AC
  Administered 2017-04-10: 126 mg via INTRAVENOUS
  Filled 2017-04-10: qty 6

## 2017-04-10 MED ORDER — SODIUM CHLORIDE 0.9 % IV SOLN
Freq: Once | INTRAVENOUS | Status: AC
  Administered 2017-04-10: 11:00:00 via INTRAVENOUS
  Filled 2017-04-10: qty 1000

## 2017-04-10 MED ORDER — SODIUM CHLORIDE 0.9 % IV SOLN
80.0000 mg/m2 | Freq: Once | INTRAVENOUS | Status: AC
  Administered 2017-04-10: 132 mg via INTRAVENOUS
  Filled 2017-04-10: qty 22

## 2017-04-10 MED ORDER — SODIUM CHLORIDE 0.9 % IV SOLN: 10.0000 mg | Freq: Once | INTRAVENOUS | Status: DC

## 2017-04-10 MED ORDER — DIPHENHYDRAMINE HCL 50 MG/ML IJ SOLN
25.0000 mg | Freq: Once | INTRAMUSCULAR | Status: AC
  Administered 2017-04-10: 25 mg via INTRAVENOUS
  Filled 2017-04-10: qty 1

## 2017-04-10 MED ORDER — FAMOTIDINE IN NACL 20-0.9 MG/50ML-% IV SOLN: 20.0000 mg | Freq: Once | INTRAVENOUS | Status: DC

## 2017-04-10 MED ORDER — SODIUM CHLORIDE 0.9% FLUSH
10.0000 mL | INTRAVENOUS | Status: DC | PRN
  Administered 2017-04-10: 10 mL via INTRAVENOUS
  Filled 2017-04-10: qty 10

## 2017-04-10 MED ORDER — DEXAMETHASONE SODIUM PHOSPHATE 10 MG/ML IJ SOLN
10.0000 mg | Freq: Once | INTRAMUSCULAR | Status: AC
  Administered 2017-04-10: 10 mg via INTRAVENOUS
  Filled 2017-04-10: qty 1
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
## 2017-04-16 NOTE — Progress Notes (Signed)
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  INTERVAL HISTORY: Patient returns to clinic today for further evaluation and consideration of cycle 6 of 12 of weekly Herceptin and Taxol.  She had an episode of dizziness and diarrhea last week and was seen in symptom management clinic.  Proceeded with treatment as planned with an extra liter of IV fluids.  She continues to be anxious, but otherwise feels well.  She has no further neurologic complaints.  She denies any recent fevers or illnesses.  She has a good appetite and denies weight loss.  She has no chest pain or shortness of breath.  She denies any nausea, vomiting, constipation, or diarrhea.  She has no urinary complaints.  Patient offers no further specific complaints today.  REVIEW OF SYSTEMS:   Review of Systems  Constitutional: Positive for malaise/fatigue. Negative for fever and weight loss.  Respiratory: Negative.  Negative for cough and shortness of breath.   Cardiovascular: Negative.  Negative for chest pain and leg swelling.  Gastrointestinal: Negative.  Negative for abdominal pain.  Genitourinary: Negative.   Musculoskeletal: Positive for joint pain.  Skin: Negative.  Negative for rash.  Neurological: Positive for weakness. Negative for sensory change.  Psychiatric/Behavioral: The patient is nervous/anxious and has insomnia.     As per HPI. Otherwise, a complete review of systems is negative.  PAST MEDICAL HISTORY: Past Medical History:  Diagnosis Date  . Abnormal Pap smear of cervix    01/2015 ascus/neg- 04/2015 ascus/neg  . Allergy   . Anxiety   . Arthritis   . Asthma   . Chronic kidney disease   . COPD  (chronic obstructive pulmonary disease) ([place])   . DDD (degenerative disc disease), lumbar   . Depression   . Elevated lipids   . Fibromyalgia   . Fibromyalgia   . Genetic testing 03/09/2017   Multi-Cancer panel (83 genes) @ [facility] - No pathogenic mutations detected  . GERD (gastroesophageal reflux disease)   . History of IBS   . Hyperthyroidism   . Joint disease   . Migraine   . Migraine   . Oxygen deficiency   . Restless leg syndrome   . Sleep apnea    Does not use C-PAP, cannot tolerate mask    PAST SURGICAL HISTORY: Past Surgical History:  Procedure Laterality Date  . ABDOMINAL HYSTERECTOMY  2008  . BREAST BIOPSY Left 02/02/2017   [place] core path pending  . BREAST EXCISIONAL BIOPSY Left 02/17/2017   lumpectomy with nl sn  . BREAST LUMPECTOMY WITH NEEDLE LOCALIZATION Left 02/17/2017   Procedure: BREAST LUMPECTOMY WITH NEEDLE LOCALIZATION;  Surgeon: Vickie Epley, MD;  Location: [facility];  Service: General;  Laterality: Left;  . CARPAL TUNNEL RELEASE Bilateral   . cryotherapy    . DILATION AND CURETTAGE OF UTERUS    . ENDOMETRIAL ABLATION    . LAPAROSCOPIC OOPHERECTOMY Left    unsure which side but thinks its the left  . LYSIS OF ADHESION Left 10/14/2015   Procedure: LYSIS OF ADHESION;  Surgeon: Thornton Park, MD;  Location: [facility];  Service: Orthopedics;  Laterality: Left;  . NECK SURGERY     lower neck fusion rods and screws  . PORTA CATH INSERTION  N/A 03/08/2017   Procedure: PORTA CATH INSERTION;  Surgeon: Katha Cabal, MD;  Location: [place] [facility];  Service: Cardiovascular;  Laterality: N/A;  . RESECTION DISTAL CLAVICAL Left 10/14/2015   Procedure: RESECTION DISTAL CLAVICAL;  Surgeon: Thornton Park, MD;  Location: [facility];  Service: Orthopedics;  Laterality: Left;  . SENTINEL NODE BIOPSY Left 02/17/2017   Procedure: SENTINEL NODE BIOPSY;  Surgeon: Vickie Epley, MD;  Location: [facility];  Service: General;  Laterality: Left;  . SHOULDER  ARTHROSCOPY WITH OPEN ROTATOR CUFF REPAIR Left 10/14/2015   Procedure: SHOULDER ARTHROSCOPY WITH OPEN ROTATOR CUFF REPAIR;  Surgeon: Thornton Park, MD;  Location: [facility];  Service: Orthopedics;  Laterality: Left;  . SHOULDER ARTHROSCOPY WITH OPEN ROTATOR CUFF REPAIR AND DISTAL CLAVICLE ACROMINECTOMY Right 09/03/2014   Procedure: RIGHT SHOULDER ARTHROSCOPY WITH MINI OPEN ROTATOR CUFF TEAR;  Surgeon: Thornton Park, MD;  Location: [facility];  Service: Orthopedics;  Laterality: Right;  biceps tenodesis, arthroscopic subacromial decompression and distal clavicle incision  . spinal injections    . SUBACROMIAL DECOMPRESSION Left 10/14/2015   Procedure: SUBACROMIAL DECOMPRESSION;  Surgeon: Thornton Park, MD;  Location: [facility];  Service: Orthopedics;  Laterality: Left;    FAMILY HISTORY: Family History  Problem Relation Age of Onset  . Breast cancer Mother 49       Glioblastoma also; deceased at 25  . Diabetes Father   . Lung cancer Father        mesothelioma; deceased 45s  . Cancer Maternal Aunt        "bone ca"; unk. primary  . Colon cancer Maternal Grandfather        dx in 70s; deceased 46  . Colon cancer Maternal Aunt        dx in 51s; currently 90s  . Lung cancer Maternal Grandmother        smoker; deceased 35  . Lung cancer Paternal Grandmother        smoker; deceased 44s  . Breast cancer Cousin        dx 36s; daughter of mat aunt with unk. primary cancer  . Cancer Other        distant cousin; unknown primary  . Colon cancer Other        dx 102s; currently 66; maternal half-sister    ADVANCED DIRECTIVES (Y/N):  N  HEALTH MAINTENANCE: Social History   Tobacco Use  . Smoking status: Current Every Day Smoker    Packs/day: 1.00    Types: Cigarettes  . Smokeless tobacco: Never Used  Substance Use Topics  . Alcohol use: No    Alcohol/week: 0.0 oz  . Drug use: No     Colonoscopy:  PAP:  Bone density:  Lipid panel:  Allergies  Allergen Reactions  . Iodine   . Bee  Venom Hives and Rash  . Cefoxitin Rash  . Cefuroxime Axetil Hives and Rash  . Cucumber Extract Rash  . Gadolinium Derivatives Swelling, Other (See Comments) and Rash    NECK BECAME RED AND TONGUE WAS SWELLING SLIGHTLY PER PT NECK BECAME RED AND TONGUE WAS SWELLING SLIGHTLY PER PT NECK BECAME RED AND TONGUE WAS SWELLING SLIGHTLY PER PT  . Iodinated Diagnostic Agents Hives and Rash  . Latex Rash  . Other Hives and Rash    Bee Stings-swelling/hives/rash Wool (textile fiber)-Rash/itching  . Tomato Rash    Red tomatoes    Current Outpatient Medications  Medication Sig Dispense Refill  . baclofen (LIORESAL) 10 MG tablet Take 10 mg by  mouth 3 (three) times daily as needed for muscle spasms (typically 2-3 times daily).     . cholecalciferol (VITAMIN D) 1000 UNITS tablet Take 1,000 Units by mouth 2 (two) times daily.     . diclofenac sodium (VOLTAREN) 1 % GEL Apply 2 g topically 4 (four) times daily as needed (for pain.).     Marland Kitchen hydrocortisone 2.5 % cream Apply 1 application topically 2 (two) times daily as needed (for itchy/dry skin.).    Marland Kitchen lidocaine-prilocaine (EMLA) cream Apply to affected area once 30 g 3  . magnesium oxide (MAG-OX) 400 MG tablet Take 400 mg by mouth daily. Reported on 07/08/2015    . nystatin (MYCOSTATIN/NYSTOP) powder Apply topically 4 (four) times daily. 15 g 0  . omeprazole (PRILOSEC) 20 MG capsule Take 20 mg by mouth daily before breakfast.     . oxyCODONE 10 MG TABS Take 1 tablet (10 mg total) by mouth every 4 (four) hours as needed for breakthrough pain. 60 tablet 0  . rosuvastatin (CRESTOR) 40 MG tablet Take 40 mg by mouth at bedtime.     . topiramate (TOPAMAX) 100 MG tablet Take 200 mg by mouth daily.     . vitamin E (VITAMIN E) 200 UNIT capsule Take 200 Units by mouth 4 (four) times a week. Take 4-5 times a week    . albuterol (ACCUNEB) 0.63 MG/3ML nebulizer solution Take 1 ampule by nebulization every 6 (six) hours as needed for wheezing.    Marland Kitchen albuterol (PROAIR  HFA) 108 (90 Base) MCG/ACT inhaler ProAir HFA 90 mcg/actuation aerosol inhaler    . EPINEPHrine (EPIPEN JR) 0.15 MG/0.3ML injection Inject 0.15 mg into the muscle as needed for anaphylaxis.    Marland Kitchen linaclotide (LINZESS) 145 MCG CAPS capsule Take 145 mcg by mouth daily as needed (for constipation.).    Marland Kitchen Multiple Vitamins-Minerals (HAIR SKIN AND NAILS FORMULA) TABS Take 2 tablets by mouth daily.     Marland Kitchen nystatin cream (MYCOSTATIN)     . ondansetron (ZOFRAN) 8 MG tablet Take 1 tablet (8 mg total) by mouth 2 (two) times daily as needed (Nausea or vomiting). (Patient not taking: Reported on 04/17/2017) 30 tablet 2  . prochlorperazine (COMPAZINE) 10 MG tablet Take 1 tablet (10 mg total) by mouth every 6 (six) hours as needed (Nausea or vomiting). (Patient not taking: Reported on 04/17/2017) 60 tablet 2  . promethazine (PHENERGAN) 25 MG tablet Take 25 mg by mouth every 6 (six) hours as needed for nausea or vomiting.    . rizatriptan (MAXALT-MLT) 10 MG disintegrating tablet Take 10 mg by mouth as needed for migraine. May repeat in 2 hours if needed     No current facility-administered medications for this visit.    Facility-Administered Medications Ordered in Other Visits  Medication Dose Route Frequency Provider Last Rate Last Dose  . 0.9 %  sodium chloride infusion   Intravenous Once Lloyd Huger, MD 999 mL/hr at 04/17/17 [phone]    . acetaminophen (TYLENOL) tablet 650 mg  650 mg Oral Once Lloyd Huger, MD      . diphenhydrAMINE (BENADRYL) injection 25 mg  25 mg Intravenous Once Lloyd Huger, MD      . heparin lock flush 100 unit/mL  500 Units Intracatheter Once PRN Lloyd Huger, MD      . PACLitaxel (TAXOL) 132 mg in sodium chloride 0.9 % 250 mL chemo infusion (</= 41m/m2)  80 mg/m2 (Treatment Plan Recorded) Intravenous Once FLloyd Huger MD      .  sodium chloride 0.9 % 100 mL with famotidine (PEPCID) 20 mg infusion  20 mg Intravenous Once Lloyd Huger, MD      .  sodium chloride flush (NS) 0.9 % injection 10 mL  10 mL Intracatheter PRN Lloyd Huger, MD      . trastuzumab (HERCEPTIN) 126 mg in sodium chloride 0.9 % 250 mL chemo infusion  2 mg/kg (Treatment Plan Recorded) Intravenous Once Lloyd Huger, MD        OBJECTIVE: Vitals:   04/17/17 0901  BP: 108/77  Pulse: 89  Resp: 18  Temp: (!) 96.7 F (35.9 C)     Body mass index is 26.59 kg/m.    ECOG FS:0 - Asymptomatic  General: Well-developed, well-nourished, no acute distress. Eyes: Pink conjunctiva, anicteric sclera. Breast: Exam deferred today. Lungs: Clear to auscultation bilaterally. Heart: Regular rate and rhythm. No rubs, murmurs, or gallops. Abdomen: Soft, nontender, nondistended. No organomegaly noted, normoactive bowel sounds. Musculoskeletal: No edema, cyanosis, or clubbing. Neuro: Alert, answering all questions appropriately. Cranial nerves grossly intact. Skin: No rashes or petechiae noted. Psych: Normal affect.  LAB RESULTS:  Lab Results  Component Value Date   NA 138 04/17/2017   K 3.7 04/17/2017   CL 111 04/17/2017   CO2 20 (L) 04/17/2017   GLUCOSE 97 04/17/2017   BUN 20 04/17/2017   CREATININE 0.90 04/17/2017   CALCIUM 8.7 (L) 04/17/2017   PROT 6.5 04/17/2017   ALBUMIN 3.4 (L) 04/17/2017   AST 15 04/17/2017   ALT 17 04/17/2017   ALKPHOS 70 04/17/2017   BILITOT 0.4 04/17/2017   GFRNONAA >60 04/17/2017   GFRAA >60 04/17/2017    Lab Results  Component Value Date   WBC 8.2 04/17/2017   NEUTROABS 5.2 04/17/2017   HGB 12.2 04/17/2017   HCT 35.0 04/17/2017   MCV 100.0 04/17/2017   PLT 354 04/17/2017     STUDIES: No results found.  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  PLAN:    1. 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  She has also had port placement.  MUGA scan on March 07, 2017 revealed an EF of 54%.  Proceed with cycle 6 of 12 of weekly Taxol and Herceptin today.  Given her difficulty sleeping, dexamethasone was removed as a premedication.  At the conclusion of patient's chemotherapy, she will then proceed with adjuvant XRT as well as Herceptin maintenance for 1 year. Finally, because patient has an ER/PR positive malignancy, she will benefit from an aromatase inhibitor for 5 years at the conclusion of all her treatments.  Return to clinic in 1 week for consideration of cycle 7 and then in 2 weeks for further evaluation and consideration of cycle 8. 2.  Genetic testing: Negative.  No further interventions are needed. 3.  Joint pain: Continue symptomatic treatment as directed. 4.  Anxiety: Patient was given a referral to psychiatry for further evaluation and adjustment of her medications as needed. 5.  Chronic pain: Continue appointments with pain clinic as scheduled. 6.  Difficulty sleeping: Discontinue dexamethasone as above.  Patient was also given a prescription for Ambien. 7.  Dizziness/dehydration: Patient will prophylactically receive an additional 1 L of IV fluids today   Patient expressed understanding and was in agreement with this plan. She also understands that She can call clinic at  any time with any questions, concerns, or complaints.   Cancer Staging Malignant neoplasm of upper-outer quadrant of left breast in female, estrogen receptor positive ([place]) Staging form: Breast, AJCC 8th Edition - Clinical stage from 02/07/2017: Stage IA (cT1c, cN0, cM0, G1, ER: Positive, PR: Positive, HER2: Positive) - Signed by Lloyd Huger, MD on 02/11/2017   Lloyd Huger, MD   04/17/2017 9:58 AM

## 2017-04-17 ENCOUNTER — Inpatient Hospital Stay: Payer: Medicare Other

## 2017-04-17 ENCOUNTER — Other Ambulatory Visit: Payer: Self-pay

## 2017-04-17 ENCOUNTER — Inpatient Hospital Stay (HOSPITAL_BASED_OUTPATIENT_CLINIC_OR_DEPARTMENT_OTHER): Payer: Medicare Other | Admitting: Oncology

## 2017-04-17 VITALS — BP 108/77 | HR 89 | Temp 96.7°F | Resp 18 | Wt 140.7 lb

## 2017-04-17 DIAGNOSIS — F419 Anxiety disorder, unspecified: Secondary | ICD-10-CM

## 2017-04-17 DIAGNOSIS — C50412 Malignant neoplasm of upper-outer quadrant of left female breast: Secondary | ICD-10-CM | POA: Diagnosis not present

## 2017-04-17 DIAGNOSIS — Z170 Estrogen receptor positive status [ER+]: Secondary | ICD-10-CM

## 2017-04-17 DIAGNOSIS — Z79899 Other long term (current) drug therapy: Secondary | ICD-10-CM

## 2017-04-17 DIAGNOSIS — R42 Dizziness and giddiness: Secondary | ICD-10-CM

## 2017-04-17 DIAGNOSIS — Z5111 Encounter for antineoplastic chemotherapy: Secondary | ICD-10-CM | POA: Diagnosis not present

## 2017-04-17 DIAGNOSIS — E860 Dehydration: Secondary | ICD-10-CM | POA: Diagnosis not present

## 2017-04-17 LAB — CBC WITH DIFFERENTIAL/PLATELET
Basophils Absolute: 0.1 10*3/uL (ref 0–0.1)
Basophils Relative: 1 %
EOS ABS: 0.2 10*3/uL (ref 0–0.7)
EOS PCT: 3 %
HCT: 35 % (ref 35.0–47.0)
HEMOGLOBIN: 12.2 g/dL (ref 12.0–16.0)
LYMPHS ABS: 2.1 10*3/uL (ref 1.0–3.6)
LYMPHS PCT: 26 %
MCH: 34.7 pg — AB (ref 26.0–34.0)
MCHC: 34.7 g/dL (ref 32.0–36.0)
MCV: 100 fL (ref 80.0–100.0)
MONOS PCT: 7 %
Monocytes Absolute: 0.6 10*3/uL (ref 0.2–0.9)
NEUTROS PCT: 63 %
Neutro Abs: 5.2 10*3/uL (ref 1.4–6.5)
Platelets: 354 10*3/uL (ref 150–440)
RBC: 3.5 MIL/uL — AB (ref 3.80–5.20)
RDW: 15.2 % — ABNORMAL HIGH (ref 11.5–14.5)
WBC: 8.2 10*3/uL (ref 3.6–11.0)

## 2017-04-17 LAB — COMPREHENSIVE METABOLIC PANEL
ALT: 17 U/L (ref 14–54)
AST: 15 U/L (ref 15–41)
Albumin: 3.4 g/dL — ABNORMAL LOW (ref 3.5–5.0)
Alkaline Phosphatase: 70 U/L (ref 38–126)
Anion gap: 7 (ref 5–15)
BUN: 20 mg/dL (ref 6–20)
CHLORIDE: 111 mmol/L (ref 101–111)
CO2: 20 mmol/L — AB (ref 22–32)
CREATININE: 0.9 mg/dL (ref 0.44–1.00)
Calcium: 8.7 mg/dL — ABNORMAL LOW (ref 8.9–10.3)
GFR calc Af Amer: >60 mL/min (ref >60)
GFR calc non Af Amer: >60 mL/min (ref >60)
Glucose, Bld: 97 mg/dL (ref 65–99)
POTASSIUM: 3.7 mmol/L (ref 3.5–5.1)
SODIUM: 138 mmol/L (ref 135–145)
Total Bilirubin: 0.4 mg/dL (ref 0.3–1.2)
Total Protein: 6.5 g/dL (ref 6.5–8.1)

## 2017-04-17 MED ORDER — TRASTUZUMAB CHEMO 150 MG IV SOLR
2.0000 mg/kg | Freq: Once | INTRAVENOUS | Status: AC
  Administered 2017-04-17: 126 mg via INTRAVENOUS
  Filled 2017-04-17: qty 6

## 2017-04-17 MED ORDER — HEPARIN SOD (PORK) LOCK FLUSH 100 UNIT/ML IV SOLN
500.0000 [IU] | Freq: Once | INTRAVENOUS | Status: AC | PRN
  Administered 2017-04-17: 500 [IU]
  Filled 2017-04-17: qty 5

## 2017-04-17 MED ORDER — SODIUM CHLORIDE 0.9 % IV SOLN
Freq: Once | INTRAVENOUS | Status: AC
  Administered 2017-04-17: 10:00:00 via INTRAVENOUS
  Filled 2017-04-17: qty 1000

## 2017-04-17 MED ORDER — FAMOTIDINE IN NACL 20-0.9 MG/50ML-% IV SOLN: 20.0000 mg | Freq: Once | INTRAVENOUS | Status: DC

## 2017-04-17 MED ORDER — ACETAMINOPHEN 325 MG PO TABS
650.0000 mg | ORAL_TABLET | Freq: Once | ORAL | Status: AC
  Administered 2017-04-17: 650 mg via ORAL
  Filled 2017-04-17: qty 2

## 2017-04-17 MED ORDER — DIPHENHYDRAMINE HCL 50 MG/ML IJ SOLN
25.0000 mg | Freq: Once | INTRAMUSCULAR | Status: AC
  Administered 2017-04-17: 25 mg via INTRAVENOUS
  Filled 2017-04-17: qty 1

## 2017-04-17 MED ORDER — SODIUM CHLORIDE 0.9 % IV SOLN
80.0000 mg/m2 | Freq: Once | INTRAVENOUS | Status: AC
  Administered 2017-04-17: 132 mg via INTRAVENOUS
  Filled 2017-04-17: qty 22

## 2017-04-17 MED ORDER — SODIUM CHLORIDE 0.9 % IV SOLN
20.0000 mg | Freq: Once | INTRAVENOUS | Status: AC
  Administered 2017-04-17: 20 mg via INTRAVENOUS
  Filled 2017-04-17: qty 100

## 2017-04-17 MED ORDER — SODIUM CHLORIDE 0.9% FLUSH
10.0000 mL | INTRAVENOUS | Status: DC | PRN
  Filled 2017-04-17: qty 10

## 2017-04-17 NOTE — Progress Notes (Signed)
Here for follow up- overall stated feeling well -but having trouble sleeping she stated ( 4 h per night ) w night sweats.

## 2017-04-24 ENCOUNTER — Inpatient Hospital Stay: Payer: Medicare Other

## 2017-04-24 VITALS — BP 102/72 | HR 81 | Temp 98.8°F | Resp 20

## 2017-04-24 DIAGNOSIS — Z5111 Encounter for antineoplastic chemotherapy: Secondary | ICD-10-CM | POA: Diagnosis not present

## 2017-04-24 DIAGNOSIS — C50412 Malignant neoplasm of upper-outer quadrant of left female breast: Secondary | ICD-10-CM

## 2017-04-24 DIAGNOSIS — Z170 Estrogen receptor positive status [ER+]: Secondary | ICD-10-CM

## 2017-04-24 LAB — CBC WITH DIFFERENTIAL/PLATELET
BASOS ABS: 0.1 10*3/uL (ref 0–0.1)
Basophils Relative: 1 %
EOS PCT: 2 %
Eosinophils Absolute: 0.2 10*3/uL (ref 0–0.7)
HEMATOCRIT: 36.6 % (ref 35.0–47.0)
Hemoglobin: 12.6 g/dL (ref 12.0–16.0)
LYMPHS ABS: 1.9 10*3/uL (ref 1.0–3.6)
LYMPHS PCT: 22 %
MCH: 34.4 pg — AB (ref 26.0–34.0)
MCHC: 34.4 g/dL (ref 32.0–36.0)
MCV: 100 fL (ref 80.0–100.0)
MONO ABS: 0.4 10*3/uL (ref 0.2–0.9)
Monocytes Relative: 5 %
NEUTROS ABS: 6.1 10*3/uL (ref 1.4–6.5)
Neutrophils Relative %: 70 %
Platelets: 371 10*3/uL (ref 150–440)
RBC: 3.66 MIL/uL — AB (ref 3.80–5.20)
RDW: 14.8 % — AB (ref 11.5–14.5)
WBC: 8.7 10*3/uL (ref 3.6–11.0)

## 2017-04-24 LAB — COMPREHENSIVE METABOLIC PANEL
ALBUMIN: 3.6 g/dL (ref 3.5–5.0)
ALT: 19 U/L (ref 14–54)
ANION GAP: 9 (ref 5–15)
AST: 23 U/L (ref 15–41)
Alkaline Phosphatase: 71 U/L (ref 38–126)
BILIRUBIN TOTAL: 0.6 mg/dL (ref 0.3–1.2)
BUN: 21 mg/dL — AB (ref 6–20)
CHLORIDE: 111 mmol/L (ref 101–111)
CO2: 21 mmol/L — ABNORMAL LOW (ref 22–32)
Calcium: 9.2 mg/dL (ref 8.9–10.3)
Creatinine, Ser: 0.97 mg/dL (ref 0.44–1.00)
GFR calc Af Amer: >60 mL/min (ref >60)
GFR calc non Af Amer: >60 mL/min (ref >60)
GLUCOSE: 164 mg/dL — AB (ref 65–99)
POTASSIUM: 3.1 mmol/L — AB (ref 3.5–5.1)
Sodium: 141 mmol/L (ref 135–145)
TOTAL PROTEIN: 6.8 g/dL (ref 6.5–8.1)

## 2017-04-24 MED ORDER — FAMOTIDINE IN NACL 20-0.9 MG/50ML-% IV SOLN: 20.0000 mg | Freq: Once | INTRAVENOUS | Status: DC

## 2017-04-24 MED ORDER — SODIUM CHLORIDE 0.9 % IV SOLN
80.0000 mg/m2 | Freq: Once | INTRAVENOUS | Status: AC
  Administered 2017-04-24: 132 mg via INTRAVENOUS
  Filled 2017-04-24: qty 22

## 2017-04-24 MED ORDER — SODIUM CHLORIDE 0.9% FLUSH
10.0000 mL | INTRAVENOUS | Status: DC | PRN
  Filled 2017-04-24: qty 10

## 2017-04-24 MED ORDER — SODIUM CHLORIDE 0.9 % IV SOLN
Freq: Once | INTRAVENOUS | Status: AC
  Administered 2017-04-24: 10:00:00 via INTRAVENOUS
  Filled 2017-04-24: qty 1000

## 2017-04-24 MED ORDER — HEPARIN SOD (PORK) LOCK FLUSH 100 UNIT/ML IV SOLN
500.0000 [IU] | Freq: Once | INTRAVENOUS | Status: AC | PRN
  Administered 2017-04-24: 500 [IU]
  Filled 2017-04-24: qty 5

## 2017-04-24 MED ORDER — SODIUM CHLORIDE 0.9 % IV SOLN
2.0000 mg/kg | Freq: Once | INTRAVENOUS | Status: AC
  Administered 2017-04-24: 126 mg via INTRAVENOUS
  Filled 2017-04-24: qty 6

## 2017-04-24 MED ORDER — DIPHENHYDRAMINE HCL 50 MG/ML IJ SOLN
25.0000 mg | Freq: Once | INTRAMUSCULAR | Status: AC
  Administered 2017-04-24: 25 mg via INTRAVENOUS
  Filled 2017-04-24: qty 1

## 2017-04-24 MED ORDER — ACETAMINOPHEN 325 MG PO TABS
650.0000 mg | ORAL_TABLET | Freq: Once | ORAL | Status: AC
  Administered 2017-04-24: 650 mg via ORAL
  Filled 2017-04-24: qty 2

## 2017-04-24 MED ORDER — SODIUM CHLORIDE 0.9 % IV SOLN
Freq: Once | INTRAVENOUS | Status: AC
  Administered 2017-04-24: 11:00:00 via INTRAVENOUS
  Filled 2017-04-24: qty 100

## 2017-04-26 ENCOUNTER — Other Ambulatory Visit: Payer: Self-pay

## 2017-04-26 ENCOUNTER — Encounter: Payer: Self-pay | Admitting: Psychiatry

## 2017-04-26 ENCOUNTER — Ambulatory Visit (INDEPENDENT_AMBULATORY_CARE_PROVIDER_SITE_OTHER): Payer: Medicare Other | Admitting: Psychiatry

## 2017-04-26 VITALS — BP 127/81 | HR 91 | Temp 98.1°F | Wt 140.4 lb

## 2017-04-26 DIAGNOSIS — F4322 Adjustment disorder with anxiety: Secondary | ICD-10-CM | POA: Diagnosis not present

## 2017-04-26 DIAGNOSIS — F17200 Nicotine dependence, unspecified, uncomplicated: Secondary | ICD-10-CM | POA: Diagnosis not present

## 2017-04-26 DIAGNOSIS — G4700 Insomnia, unspecified: Secondary | ICD-10-CM

## 2017-04-26 MED ORDER — DOXEPIN HCL 10 MG PO CAPS: 10.0000 mg | ORAL_CAPSULE | Freq: Every evening | ORAL | 1 refills | Status: DC | PRN

## 2017-04-26 MED ORDER — PROPRANOLOL HCL 10 MG PO TABS: 10.0000 mg | ORAL_TABLET | Freq: Two times a day (BID) | ORAL | 1 refills | Status: DC | PRN
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
## 2017-04-26 NOTE — Patient Instructions (Signed)
Propranolol tablets  What is this medicine?  PROPRANOLOL (proe PRAN oh lole) is a beta-blocker. Beta-blockers reduce the workload on the heart and help it to beat more regularly. This medicine is used to treat high blood pressure, to control irregular heart rhythms (arrhythmias) and to relieve chest pain caused by angina. It may also be helpful after a heart attack. This medicine is also used to prevent migraine headaches, relieve uncontrollable shaking (tremors), and help certain problems related to the thyroid gland and adrenal gland.  This medicine may be used for other purposes; ask your health care provider or pharmacist if you have questions.  COMMON BRAND NAME(S): Inderal  What should I tell my health care provider before I take this medicine?  They need to know if you have any of these conditions:  -circulation problems or blood vessel disease  -diabetes  -history of heart attack or heart disease, vasospastic angina  -kidney disease  -liver disease  -lung or breathing disease, like asthma or emphysema  -pheochromocytoma  -slow heart rate  -thyroid disease  -an unusual or allergic reaction to propranolol, other beta-blockers, medicines, foods, dyes, or preservatives  -pregnant or trying to get pregnant  -breast-feeding  How should I use this medicine?  Take this medicine by mouth with a glass of water. Follow the directions on the prescription label. Take your doses at regular intervals. Do not take your medicine more often than directed. Do not stop taking except on your the advice of your doctor or health care professional.  Talk to your pediatrician regarding the use of this medicine in children. Special care may be needed.  Overdosage: If you think you have taken too much of this medicine contact a poison control center or emergency room at once.  NOTE: This medicine is only for you. Do not share this medicine with others.  What if I miss a dose?  If you miss a dose, take it as soon as you can. If it is  almost time for your next dose, take only that dose. Do not take double or extra doses.  What may interact with this medicine?  Do not take this medicine with any of the following medications:  -feverfew  -phenothiazines like chlorpromazine, mesoridazine, prochlorperazine, thioridazine  This medicine may also interact with the following medications:  -aluminum hydroxide gel  -antipyrine  -antiviral medicines for HIV or AIDS  -barbiturates like phenobarbital  -certain medicines for blood pressure, heart disease, irregular heart beat  -cimetidine  -ciprofloxacin  -diazepam  -fluconazole  -haloperidol  -isoniazid  -medicines for cholesterol like cholestyramine or colestipol  -medicines for mental depression  -medicines for migraine headache like almotriptan, eletriptan, frovatriptan, naratriptan, rizatriptan, sumatriptan, zolmitriptan  -NSAIDs, medicines for pain and inflammation, like ibuprofen or naproxen  -phenytoin  -rifampin  -teniposide  -theophylline  -thyroid medicines  -tolbutamide  -warfarin  -zileuton  This list may not describe all possible interactions. Give your health care provider a list of all the medicines, herbs, non-prescription drugs, or dietary supplements you use. Also tell them if you smoke, drink alcohol, or use illegal drugs. Some items may interact with your medicine.  What should I watch for while using this medicine?  Visit your doctor or health care professional for regular check ups. Check your blood pressure and pulse rate regularly. Ask your health care professional what your blood pressure and pulse rate should be, and when you should contact them.  You may get drowsy or dizzy. Do not drive, use machinery, or   you have diabetes, check with your doctor or health care professional before you change your diet or the dose of your diabetic medicine. Do not treat yourself for coughs, colds, or pain while you are taking this medicine without asking your doctor or health care professional for advice. Some ingredients may increase your blood pressure. What side effects may I notice from receiving this medicine? Side effects that you should report to your doctor or health care professional as soon as possible: -allergic reactions like skin rash, itching or hives, swelling of the face, lips, or tongue -breathing problems -changes in blood sugar -cold hands or feet -difficulty sleeping, nightmares -dry peeling skin -hallucinations -muscle cramps or weakness -slow heart rate -swelling of the legs and ankles -vomiting Side effects that usually do not require medical attention (report to your doctor or health care professional if they continue or are bothersome): -change in sex drive or performance -diarrhea -dry sore eyes -hair loss -nausea -weak or tired This list may not describe all possible side effects. Call your doctor for medical advice about side effects. You may report side effects to FDA at 1-800-FDA-1088. Where should I keep my medicine? Keep out of the reach of children. Store at room temperature between 15 and 30 degrees C (59 and 86 degrees F). Protect from light. Throw away any unused medicine after the expiration date. NOTE: This sheet is a summary. It may not cover all possible information. If you have questions about this medicine, talk to your doctor, pharmacist, or health care provider.  2018 [employer] Standard (2012-09-21 14:51:53) Doxepin capsules What is this medicine? DOXEPIN (DOX e pin) is used to treat depression and anxiety. This medicine may be used for other purposes; ask your health care provider or pharmacist if you have  questions. COMMON BRAND NAME(S): Sinequan What should I tell my health care provider before I take this medicine? They need to know if you have any of these conditions: -bipolar disorder -difficulty passing urine -glaucoma -heart disease -if you frequently drink alcohol containing drinks -liver disease -lung or breathing disease, like asthma or sleep apnea -prostate trouble -schizophrenia -seizures -suicidal thoughts, plans, or attempt; a previous suicide attempt by you or a family member -an unusual or allergic reaction to doxepin, other medicines, foods, dyes, or preservatives -pregnant or trying to get pregnant -breast-feeding How should I use this medicine? Take this medicine by mouth with a glass of water. Follow the directions on the prescription label. Take your doses at regular intervals. Do not take your medicine more often than directed. Do not stop taking this medicine suddenly except upon the advice of your doctor. Stopping this medicine too quickly may cause serious side effects or your condition may worsen. A special MedGuide will be given to you by the pharmacist with each prescription and refill. Be sure to read this information carefully each time. Talk to your pediatrician regarding the use of this medicine in children. While this drug may be prescribed for children as young as 12 years for selected conditions, precautions do apply. Overdosage: If you think you have taken too much of this medicine contact a poison control center or emergency room at once. NOTE: This medicine is only for you. Do not share this medicine with others. What if I miss a dose? If you miss a dose, take it as soon as you can. If it is almost time for your next dose, take only that dose. Do not take double or extra doses. What may interact with  this medicine? Do not take this medicine with any of the following medications: -arsenic trioxide -certain medicines used to regulate abnormal heartbeat  or to treat other heart conditions -cisapride -halofantrine -levomethadyl -linezolid -MAOIs like Carbex, Eldepryl, Marplan, Nardil, and Parnate -methylene blue -other medicines for mental depression -phenothiazines like perphenazine, thioridazine and chlorpromazine -pimozide -procarbazine -sparfloxacin -St. John's Wort -ziprasidone This medicine may also interact with the following medications: -cimetidine -tolazamide This list may not describe all possible interactions. Give your health care provider a list of all the medicines, herbs, non-prescription drugs, or dietary supplements you use. Also tell them if you smoke, drink alcohol, or use illegal drugs. Some items may interact with your medicine. What should I watch for while using this medicine? Visit your doctor or health care professional for regular checks on your progress. It can take several days before you feel the full effect of this medicine. If you have been taking this medicine regularly for some time, do not suddenly stop taking it. You must gradually reduce the dose or you may get severe side effects. Ask your doctor or health care professional for advice. Even after you stop taking this medicine it can still affect your body for several days. Patients and their families should watch out for new or worsening thoughts of suicide or depression. Also watch out for sudden changes in feelings such as feeling anxious, agitated, panicky, irritable, hostile, aggressive, impulsive, severely restless, overly excited and hyperactive, or not being able to sleep. If this happens, especially at the beginning of treatment or after a change in dose, call your health care professional. Dennis Bast may get drowsy or dizzy. Do not drive, use machinery, or do anything that needs mental alertness until you know how this medicine affects you. Do not stand or sit up quickly, especially if you are an older patient. This reduces the risk of dizzy or fainting  spells. Alcohol may increase dizziness and drowsiness. Avoid alcoholic drinks. Do not treat yourself for coughs, colds, or allergies without asking your doctor or health care professional for advice. Some ingredients can increase possible side effects. Your mouth may get dry. Chewing sugarless gum or sucking hard candy, and drinking plenty of water may help. Contact your doctor if the problem does not go away or is severe. This medicine may cause dry eyes and blurred vision. If you wear contact lenses you may feel some discomfort. Lubricating drops may help. See your eye doctor if the problem does not go away or is severe. This medicine can make you more sensitive to the sun. Keep out of the sun. If you cannot avoid being in the sun, wear protective clothing and use sunscreen. Do not use sun lamps or tanning beds/booths. What side effects may I notice from receiving this medicine? Side effects that you should report to your doctor or health care professional as soon as possible: -allergic reactions like skin rash, itching or hives, swelling of the face, lips, or tongue -anxious -breathing problems -changes in vision -confusion -elevated mood, decreased need for sleep, racing thoughts, impulsive behavior -eye pain -fast, irregular heartbeat -feeling faint or lightheaded, falls -feeling agitated, angry, or irritable -fever with increased sweating -hallucination, loss of contact with reality -seizures -stiff muscles -suicidal thoughts or other mood changes -tingling, pain, or numbness in the feet or hands -trouble passing urine or change in the amount of urine -trouble sleeping -unusually weak or tired -vomiting -yellowing of the eyes or skin Side effects that usually do not require medical attention (  report to your doctor or health care professional if they continue or are bothersome): -change in sex drive or performance -change in appetite or weight -constipation -dizziness -dry  mouth -nausea -tired -tremors -upset stomach This list may not describe all possible side effects. Call your doctor for medical advice about side effects. You may report side effects to FDA at 1-800-FDA-1088. Where should I keep my medicine? Keep out of the reach of children. Store at room temperature between 15 and 30 degrees C (59 and 86 degrees F). Throw away any unused medicine after the expiration date. NOTE: This sheet is a summary. It may not cover all possible information. If you have questions about this medicine, talk to your doctor, pharmacist, or health care provider.  2018 [employer] Standard (2015-06-19 12:35:05)

## 2017-04-27 ENCOUNTER — Telehealth: Payer: Self-pay | Admitting: *Deleted

## 2017-04-27 NOTE — Telephone Encounter (Signed)
Patient called to report that she has "bumps" all over her head and that she had her head shvaed prior to that. She is asking what she can use on them as they are "sore". Please advise

## 2017-04-27 NOTE — Telephone Encounter (Signed)
Patient instructed to use Hydrocortisone cream on er "bumps" and she agreed to this and thanked me for calling

## 2017-04-27 NOTE — Telephone Encounter (Signed)
hydrocortisone cream is fine and we can evaluate her head next time she is here.

## 2017-05-01 ENCOUNTER — Inpatient Hospital Stay: Payer: Medicare Other

## 2017-05-01 ENCOUNTER — Encounter: Payer: Self-pay | Admitting: Oncology

## 2017-05-01 ENCOUNTER — Inpatient Hospital Stay (HOSPITAL_BASED_OUTPATIENT_CLINIC_OR_DEPARTMENT_OTHER): Payer: Medicare Other | Admitting: Oncology

## 2017-05-01 ENCOUNTER — Inpatient Hospital Stay: Payer: Medicare Other | Attending: Oncology

## 2017-05-01 VITALS — BP 110/77 | HR 99 | Temp 97.5°F | Resp 18 | Wt 139.4 lb

## 2017-05-01 DIAGNOSIS — R21 Rash and other nonspecific skin eruption: Secondary | ICD-10-CM | POA: Insufficient documentation

## 2017-05-01 DIAGNOSIS — Z5111 Encounter for antineoplastic chemotherapy: Secondary | ICD-10-CM | POA: Diagnosis not present

## 2017-05-01 DIAGNOSIS — F419 Anxiety disorder, unspecified: Secondary | ICD-10-CM | POA: Insufficient documentation

## 2017-05-01 DIAGNOSIS — Z170 Estrogen receptor positive status [ER+]: Secondary | ICD-10-CM

## 2017-05-01 DIAGNOSIS — Z79899 Other long term (current) drug therapy: Secondary | ICD-10-CM | POA: Insufficient documentation

## 2017-05-01 DIAGNOSIS — M2550 Pain in unspecified joint: Secondary | ICD-10-CM

## 2017-05-01 DIAGNOSIS — C50412 Malignant neoplasm of upper-outer quadrant of left female breast: Secondary | ICD-10-CM

## 2017-05-01 DIAGNOSIS — G4730 Sleep apnea, unspecified: Secondary | ICD-10-CM | POA: Diagnosis not present

## 2017-05-01 DIAGNOSIS — F17210 Nicotine dependence, cigarettes, uncomplicated: Secondary | ICD-10-CM | POA: Diagnosis not present

## 2017-05-01 LAB — CBC WITH DIFFERENTIAL/PLATELET
Basophils Absolute: 0.1 10*3/uL (ref 0–0.1)
Basophils Relative: 1 %
Eosinophils Absolute: 0.3 10*3/uL (ref 0–0.7)
Eosinophils Relative: 3 %
HEMATOCRIT: 36.1 % (ref 35.0–47.0)
HEMOGLOBIN: 12.6 g/dL (ref 12.0–16.0)
LYMPHS ABS: 2.3 10*3/uL (ref 1.0–3.6)
LYMPHS PCT: 21 %
MCH: 34.9 pg — AB (ref 26.0–34.0)
MCHC: 35 g/dL (ref 32.0–36.0)
MCV: 99.7 fL (ref 80.0–100.0)
MONOS PCT: 4 %
Monocytes Absolute: 0.5 10*3/uL (ref 0.2–0.9)
NEUTROS ABS: 7.8 10*3/uL — AB (ref 1.4–6.5)
NEUTROS PCT: 71 %
Platelets: 359 10*3/uL (ref 150–440)
RBC: 3.62 MIL/uL — ABNORMAL LOW (ref 3.80–5.20)
RDW: 15.4 % — ABNORMAL HIGH (ref 11.5–14.5)
WBC: 11 10*3/uL (ref 3.6–11.0)

## 2017-05-01 LAB — COMPREHENSIVE METABOLIC PANEL
ALT: 18 U/L (ref 14–54)
ANION GAP: 8 (ref 5–15)
AST: 23 U/L (ref 15–41)
Albumin: 3.7 g/dL (ref 3.5–5.0)
Alkaline Phosphatase: 83 U/L (ref 38–126)
BUN: 19 mg/dL (ref 6–20)
CHLORIDE: 110 mmol/L (ref 101–111)
CO2: 21 mmol/L — AB (ref 22–32)
Calcium: 9.1 mg/dL (ref 8.9–10.3)
Creatinine, Ser: 0.91 mg/dL (ref 0.44–1.00)
GFR calc non Af Amer: >60 mL/min (ref >60)
Glucose, Bld: 168 mg/dL — ABNORMAL HIGH (ref 65–99)
Potassium: 3.2 mmol/L — ABNORMAL LOW (ref 3.5–5.1)
SODIUM: 139 mmol/L (ref 135–145)
Total Bilirubin: 0.2 mg/dL — ABNORMAL LOW (ref 0.3–1.2)
Total Protein: 6.9 g/dL (ref 6.5–8.1)

## 2017-05-01 MED ORDER — TRIAMCINOLONE ACETONIDE 0.5 % EX OINT: 1.0000 "application " | TOPICAL_OINTMENT | Freq: Two times a day (BID) | CUTANEOUS | 0 refills | Status: DC

## 2017-05-01 MED ORDER — SODIUM CHLORIDE 0.9% FLUSH
10.0000 mL | INTRAVENOUS | Status: DC | PRN
  Administered 2017-05-01: 10 mL via INTRAVENOUS
  Filled 2017-05-01: qty 10

## 2017-05-01 MED ORDER — SODIUM CHLORIDE 0.9 % IV SOLN
80.0000 mg/m2 | Freq: Once | INTRAVENOUS | Status: AC
  Administered 2017-05-01: 132 mg via INTRAVENOUS
  Filled 2017-05-01: qty 22

## 2017-05-01 MED ORDER — HEPARIN SOD (PORK) LOCK FLUSH 100 UNIT/ML IV SOLN
500.0000 [IU] | Freq: Once | INTRAVENOUS | Status: AC
  Administered 2017-05-01: 500 [IU] via INTRAVENOUS
  Filled 2017-05-01: qty 5

## 2017-05-01 MED ORDER — SODIUM CHLORIDE 0.9 % IV SOLN
Freq: Once | INTRAVENOUS | Status: AC
  Administered 2017-05-01: 10:00:00 via INTRAVENOUS
  Filled 2017-05-01: qty 1000

## 2017-05-01 MED ORDER — FAMOTIDINE IN NACL 20-0.9 MG/50ML-% IV SOLN: 20.0000 mg | Freq: Once | INTRAVENOUS | Status: DC

## 2017-05-01 MED ORDER — SODIUM CHLORIDE 0.9 % IV SOLN
Freq: Once | INTRAVENOUS | Status: AC
  Administered 2017-05-01: 10:00:00 via INTRAVENOUS
  Filled 2017-05-01: qty 100

## 2017-05-01 MED ORDER — HEPARIN SOD (PORK) LOCK FLUSH 100 UNIT/ML IV SOLN
500.0000 [IU] | Freq: Once | INTRAVENOUS | Status: AC | PRN
  Administered 2017-05-01: 500 [IU]

## 2017-05-01 MED ORDER — SODIUM CHLORIDE 0.9% FLUSH
10.0000 mL | INTRAVENOUS | Status: DC | PRN
  Filled 2017-05-01: qty 10

## 2017-05-01 MED ORDER — DIPHENHYDRAMINE HCL 50 MG/ML IJ SOLN
25.0000 mg | Freq: Once | INTRAMUSCULAR | Status: AC
  Administered 2017-05-01: 25 mg via INTRAVENOUS
  Filled 2017-05-01: qty 1

## 2017-05-01 MED ORDER — TRASTUZUMAB CHEMO 150 MG IV SOLR
2.0000 mg/kg | Freq: Once | INTRAVENOUS | Status: AC
  Administered 2017-05-01: 126 mg via INTRAVENOUS
  Filled 2017-05-01: qty 6

## 2017-05-01 MED ORDER — SODIUM CHLORIDE 0.9 % IV SOLN
INTRAVENOUS | Status: DC
  Administered 2017-05-01: 10:00:00 via INTRAVENOUS
  Filled 2017-05-01 (×2): qty 1000

## 2017-05-01 MED ORDER — ACETAMINOPHEN 325 MG PO TABS
650.0000 mg | ORAL_TABLET | Freq: Once | ORAL | Status: AC
  Administered 2017-05-01: 650 mg via ORAL
  Filled 2017-05-01: qty 2

## 2017-05-01 NOTE — Progress Notes (Signed)
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  INTERVAL HISTORY: Patient returns to clinic for further evaluation and consideration of cycle 8 of 12 of weekly Herceptin and Taxol. Patient has not had any more episodes of dizziness since her visit in symptom management on 04/10/2017. She tells me she is drinking plenty of fluids. She has been using Pedialyte to help with her hydration status. Patient has developed a rash on her scalp. She noticed the rash approximately 3 weeks ago and it has become progressively worse after she shaved her head. Called into clinic and was told to apply hydrocortisone cream. This has offered little relief. Otherwise she denies any further complaints. Her appetite remains good and she denies any weight loss. She denies chest pain, shortness of breath, nausea, vomiting, constipation or urinary complaints.  REVIEW OF SYSTEMS:   Review of Systems  Constitutional: Negative.  Negative for chills, fever, malaise/fatigue and weight loss.  HENT: Negative for congestion and ear pain.   Eyes: Negative.  Negative for blurred vision and double vision.  Respiratory: Negative.  Negative for cough, sputum production and shortness of breath.   Cardiovascular: Negative.  Negative for chest pain, palpitations and leg swelling.  Gastrointestinal: Negative.  Negative for abdominal pain, constipation, diarrhea, nausea and vomiting.  Genitourinary: Negative for dysuria, frequency and urgency.  Musculoskeletal: Negative for back pain and falls.  Skin: Positive for itching and rash (scalp).  Neurological: Negative.  Negative for weakness and headaches.  Endo/Heme/Allergies:  Negative.  Does not bruise/bleed easily.  Psychiatric/Behavioral: Negative.  Negative for depression. The patient is not nervous/anxious and does not have insomnia.     As per HPI. Otherwise, a complete review of systems is negative.  PAST MEDICAL HISTORY: Past Medical History:  Diagnosis Date  . Abnormal Pap smear of cervix    01/2015 ascus/neg- 04/2015 ascus/neg  . Allergy   . Anxiety   . Arthritis   . Asthma   . Chronic kidney disease   . COPD (chronic obstructive pulmonary disease) ([place])   . DDD (degenerative disc disease), lumbar   . Depression   . Elevated lipids   . Fibromyalgia   . Fibromyalgia   . Genetic testing 03/09/2017   Multi-Cancer panel (83 genes) @ [facility] - No pathogenic mutations detected  . GERD (gastroesophageal reflux disease)   . History of IBS   . Hyperthyroidism   . Joint disease   . Migraine   . Migraine   . Oxygen deficiency   . Restless leg syndrome   . Sleep apnea    Does not use C-PAP, cannot tolerate mask    PAST SURGICAL HISTORY: Past Surgical History:  Procedure Laterality Date  . ABDOMINAL HYSTERECTOMY  2008  . BREAST BIOPSY Left 02/02/2017   [place] core path pending  . BREAST EXCISIONAL BIOPSY Left 02/17/2017   lumpectomy with nl sn  . BREAST LUMPECTOMY WITH NEEDLE LOCALIZATION Left 02/17/2017   Procedure: BREAST LUMPECTOMY WITH NEEDLE LOCALIZATION;  Surgeon: Vickie Epley, MD;  Location: [facility];  Service: General;  Laterality: Left;  . CARPAL TUNNEL RELEASE Bilateral   . cryotherapy    . DILATION AND CURETTAGE OF UTERUS    . ENDOMETRIAL ABLATION    . LAPAROSCOPIC  OOPHERECTOMY Left    unsure which side but thinks its the left  . LYSIS OF ADHESION Left 10/14/2015   Procedure: LYSIS OF ADHESION;  Surgeon: Thornton Park, MD;  Location: [facility];  Service: Orthopedics;  Laterality: Left;  . NECK SURGERY     lower neck fusion rods and screws  . PORTA CATH INSERTION N/A 03/08/2017   Procedure: PORTA CATH INSERTION;  Surgeon:  Katha Cabal, MD;  Location: [place] [facility];  Service: Cardiovascular;  Laterality: N/A;  . RESECTION DISTAL CLAVICAL Left 10/14/2015   Procedure: RESECTION DISTAL CLAVICAL;  Surgeon: Thornton Park, MD;  Location: [facility];  Service: Orthopedics;  Laterality: Left;  . SENTINEL NODE BIOPSY Left 02/17/2017   Procedure: SENTINEL NODE BIOPSY;  Surgeon: Vickie Epley, MD;  Location: [facility];  Service: General;  Laterality: Left;  . SHOULDER ARTHROSCOPY WITH OPEN ROTATOR CUFF REPAIR Left 10/14/2015   Procedure: SHOULDER ARTHROSCOPY WITH OPEN ROTATOR CUFF REPAIR;  Surgeon: Thornton Park, MD;  Location: [facility];  Service: Orthopedics;  Laterality: Left;  . SHOULDER ARTHROSCOPY WITH OPEN ROTATOR CUFF REPAIR AND DISTAL CLAVICLE ACROMINECTOMY Right 09/03/2014   Procedure: RIGHT SHOULDER ARTHROSCOPY WITH MINI OPEN ROTATOR CUFF TEAR;  Surgeon: Thornton Park, MD;  Location: [facility];  Service: Orthopedics;  Laterality: Right;  biceps tenodesis, arthroscopic subacromial decompression and distal clavicle incision  . spinal injections    . SUBACROMIAL DECOMPRESSION Left 10/14/2015   Procedure: SUBACROMIAL DECOMPRESSION;  Surgeon: Thornton Park, MD;  Location: [facility];  Service: Orthopedics;  Laterality: Left;    FAMILY HISTORY: Family History  Problem Relation Age of Onset  . Breast cancer Mother 46       Glioblastoma also; deceased at 26  . Diabetes Father   . Lung cancer Father        mesothelioma; deceased 21s  . Cancer Maternal Aunt        "bone ca"; unk. primary  . Colon cancer Maternal Grandfather        dx in 38s; deceased 78  . Colon cancer Maternal Aunt        dx in 76s; currently 59s  . Lung cancer Maternal Grandmother        smoker; deceased 81  . Lung cancer Paternal Grandmother        smoker; deceased 62s  . Breast cancer Cousin        dx 49s; daughter of mat aunt with unk. primary cancer  . Cancer Other        distant cousin; unknown primary  . Colon cancer Other         dx 84s; currently 34; maternal half-sister  . Bipolar disorder Sister   . Schizophrenia Sister     ADVANCED DIRECTIVES (Y/N):  N  HEALTH MAINTENANCE: Social History   Tobacco Use  . Smoking status: Current Every Day Smoker    Packs/day: 1.00    Types: Cigarettes  . Smokeless tobacco: Never Used  Substance Use Topics  . Alcohol use: No    Alcohol/week: 0.0 oz  . Drug use: No     Colonoscopy:  PAP:  Bone density:  Lipid panel:  Allergies  Allergen Reactions  . Iodine   . Bee Venom Hives and Rash  . Cefoxitin Rash  . Cefuroxime Axetil Hives and Rash  . Cucumber Extract Rash  . Gadolinium Derivatives Swelling, Other (See Comments) and Rash    NECK BECAME RED AND TONGUE WAS SWELLING SLIGHTLY PER PT NECK BECAME RED AND TONGUE WAS  SWELLING SLIGHTLY PER PT NECK BECAME RED AND TONGUE WAS SWELLING SLIGHTLY PER PT  . Iodinated Diagnostic Agents Hives and Rash  . Latex Rash  . Other Hives and Rash    Bee Stings-swelling/hives/rash Wool (textile fiber)-Rash/itching  . Tomato Rash    Red tomatoes    Current Outpatient Medications  Medication Sig Dispense Refill  . albuterol (ACCUNEB) 0.63 MG/3ML nebulizer solution Take 1 ampule by nebulization every 6 (six) hours as needed for wheezing.    Marland Kitchen albuterol (PROAIR HFA) 108 (90 Base) MCG/ACT inhaler ProAir HFA 90 mcg/actuation aerosol inhaler    . baclofen (LIORESAL) 10 MG tablet Take 10 mg by mouth 3 (three) times daily as needed for muscle spasms (typically 2-3 times daily).     . cholecalciferol (VITAMIN D) 1000 UNITS tablet Take 1,000 Units by mouth 2 (two) times daily.     . diclofenac sodium (VOLTAREN) 1 % GEL Apply 2 g topically 4 (four) times daily as needed (for pain.).     Marland Kitchen doxepin (SINEQUAN) 10 MG capsule Take 1-2 capsules (10-20 mg total) by mouth at bedtime as needed. 60 capsule 1  . EPINEPHrine (EPIPEN JR) 0.15 MG/0.3ML injection Inject 0.15 mg into the muscle as needed for anaphylaxis.    . hydrocortisone  2.5 % cream Apply 1 application topically 2 (two) times daily as needed (for itchy/dry skin.).    Marland Kitchen lidocaine-prilocaine (EMLA) cream Apply to affected area once 30 g 3  . linaclotide (LINZESS) 145 MCG CAPS capsule Take 145 mcg by mouth daily as needed (for constipation.).    Marland Kitchen magnesium oxide (MAG-OX) 400 MG tablet Take 400 mg by mouth daily. Reported on 07/08/2015    . Multiple Vitamins-Minerals (HAIR SKIN AND NAILS FORMULA) TABS Take 2 tablets by mouth daily.     Marland Kitchen nystatin (MYCOSTATIN/NYSTOP) powder Apply topically 4 (four) times daily. 15 g 0  . nystatin cream (MYCOSTATIN)     . omeprazole (PRILOSEC) 20 MG capsule Take 20 mg by mouth daily before breakfast.     . ondansetron (ZOFRAN) 8 MG tablet Take 1 tablet (8 mg total) by mouth 2 (two) times daily as needed (Nausea or vomiting). 30 tablet 2  . oxyCODONE 10 MG TABS Take 1 tablet (10 mg total) by mouth every 4 (four) hours as needed for breakthrough pain. 60 tablet 0  . prochlorperazine (COMPAZINE) 10 MG tablet Take 1 tablet (10 mg total) by mouth every 6 (six) hours as needed (Nausea or vomiting). 60 tablet 2  . promethazine (PHENERGAN) 25 MG tablet Take 25 mg by mouth every 6 (six) hours as needed for nausea or vomiting.    . propranolol (INDERAL) 10 MG tablet Take 1 tablet (10 mg total) by mouth 2 (two) times daily as needed. For severe anxiety sx 60 tablet 1  . rizatriptan (MAXALT-MLT) 10 MG disintegrating tablet Take 10 mg by mouth as needed for migraine. May repeat in 2 hours if needed    . rosuvastatin (CRESTOR) 40 MG tablet Take 40 mg by mouth at bedtime.     . topiramate (TOPAMAX) 100 MG tablet Take 200 mg by mouth daily.     . vitamin E (VITAMIN E) 200 UNIT capsule Take 200 Units by mouth 4 (four) times a week. Take 4-5 times a week    . triamcinolone ointment (KENALOG) 0.5 % Apply 1 application topically 2 (two) times daily. 30 g 0   No current facility-administered medications for this visit.    Facility-Administered Medications  Ordered in Other  Visits  Medication Dose Route Frequency Provider Last Rate Last Dose  . 0.9 %  sodium chloride infusion   Intravenous Continuous Jacquelin Hawking, NP   Stopped at 05/01/17 1150  . sodium chloride flush (NS) 0.9 % injection 10 mL  10 mL Intravenous PRN Lloyd Huger, MD   10 mL at 05/01/17 0837  . sodium chloride flush (NS) 0.9 % injection 10 mL  10 mL Intracatheter PRN Lloyd Huger, MD        OBJECTIVE: Vitals:   05/01/17 0847  BP: 110/77  Pulse: 99  Resp: 18  Temp: (!) 97.5 F (36.4 C)     Body mass index is 26.34 kg/m.    ECOG FS:0 - Asymptomatic  Physical Exam  Constitutional: She is oriented to person, place, and time and well-developed, well-nourished, and in no distress. Vital signs are normal.  HENT:  Head: Normocephalic and atraumatic.  Eyes: Pupils are equal, round, and reactive to light.  Neck: Normal range of motion.  Cardiovascular: Normal rate, regular rhythm and normal heart sounds.  No murmur heard. Pulmonary/Chest: Effort normal and breath sounds normal. She has no wheezes.  Abdominal: Soft. Normal appearance and bowel sounds are normal. She exhibits no distension. There is no tenderness.  Musculoskeletal: Normal range of motion. She exhibits no edema.  Neurological: She is alert and oriented to person, place, and time. Gait normal.  Skin: Skin is warm, dry and intact. Rash noted. Rash is maculopapular.     Psychiatric: Mood, memory, affect and judgment normal.    LAB RESULTS:  Lab Results  Component Value Date   NA 139 05/01/2017   K 3.2 (L) 05/01/2017   CL 110 05/01/2017   CO2 21 (L) 05/01/2017   GLUCOSE 168 (H) 05/01/2017   BUN 19 05/01/2017   CREATININE 0.91 05/01/2017   CALCIUM 9.1 05/01/2017   PROT 6.9 05/01/2017   ALBUMIN 3.7 05/01/2017   AST 23 05/01/2017   ALT 18 05/01/2017   ALKPHOS 83 05/01/2017   BILITOT 0.2 (L) 05/01/2017   GFRNONAA >60 05/01/2017   GFRAA >60 05/01/2017    Lab Results  Component  Value Date   WBC 11.0 05/01/2017   NEUTROABS 7.8 (H) 05/01/2017   HGB 12.6 05/01/2017   HCT 36.1 05/01/2017   MCV 99.7 05/01/2017   PLT 359 05/01/2017     STUDIES: No results found.  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  PLAN:    1. 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  She has also had port placement.  MUGA scan on March 07, 2017 revealed an EF of 54%.  Proceed with cycle 8 of 12 of weekly Taxol and Herceptin today. Labs look ok.  Given her difficulty sleeping, dexamethasone was removed as a premedication.  At the conclusion of patient's chemotherapy, she will then proceed with adjuvant XRT as well as Herceptin maintenance for 1 year. Patient has expressed desire to have a break between the end of her Taxol and Herceptin treatments and before she starts her radiation and Herceptin maintenance treatment. She would like to go to the beach with a friend. I told her I would speak with Dr. Grayland Ormond.  Return to clinic in 1 week for consideration of cycle 9 and then in 2 weeks for further evaluation and consideration of cycle 10.  2.  Genetic testing: Negative.  No further interventions are needed. 3.  Joint pain: Continue symptomatic treatment as directed. 4.  Anxiety: Patient was given a referral to psychiatry for further evaluation and adjustment of her medications as needed. 5.  Chronic pain: Continue appointments with pain clinic as scheduled. 6.  Difficulty sleeping: Continue Ambien PRN. 7.  Dizziness/dehydration: She appears to have not had any additional episodes of dizziness but sodium continues to be lowe. Today Na 132. She requests to have additional fluids during each treatment to prevent any further episodes. This is okay with me. Placed orders  accordingly. Continue with Pedialyte supplementation. 8. Rash on scalp: Patient instructed to take 25 mg Benadryl at bedtime for the next several nights and to help with Benadryl. Instructed not to take with Ambien. Rx Kenalog 0.5% cream prescribed and sent to pharmacy. Instructed patient that if lesions are not better in the next several days to let us know.    Patient expressed understanding and was in agreement with this plan. She also understands that She can call clinic at any time with any questions, concerns, or complaints.   Cancer Staging Malignant neoplasm of upper-outer quadrant of left breast in female, estrogen receptor positive ([place]) Staging form: Breast, AJCC 8th Edition - Clinical stage from 02/07/2017: Stage IA (cT1c, cN0, cM0, G1, ER: Positive, PR: Positive, HER2: Positive) - Signed by Lloyd Huger, MD on 02/11/2017   Jacquelin Hawking, NP   05/01/2017 1:29 PM

## 2017-05-01 NOTE — Progress Notes (Signed)
Patient here for follow up with labs and treatment with Taxol and Herceptin. She reports having a itchy and painful rash covering her entire head, which started about three weeks ago.

## 2017-05-03 ENCOUNTER — Other Ambulatory Visit: Payer: Self-pay | Admitting: Oncology

## 2017-05-05 ENCOUNTER — Other Ambulatory Visit: Payer: Self-pay | Admitting: *Deleted

## 2017-05-05 DIAGNOSIS — C50919 Malignant neoplasm of unspecified site of unspecified female breast: Secondary | ICD-10-CM

## 2017-05-07 NOTE — Progress Notes (Signed)
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  INTERVAL HISTORY: Patient returns to clinic today for further evaluation and consideration of cycle 9 of 12 of weekly Herceptin and Taxol.  The rash on her head has not improved despite multiple topical treatments.  She otherwise feels well and is asymptomatic.  She has no neurologic complaints.  She denies any recent fevers or illnesses.  She has a good appetite and denies weight loss.  She has no chest pain or shortness of breath.  She denies any nausea, vomiting, constipation, or diarrhea.  She has no urinary complaints.  Patient offers no further specific complaints today.  REVIEW OF SYSTEMS:   Review of Systems  Constitutional: Negative.  Negative for fever, malaise/fatigue and weight loss.  Respiratory: Negative.  Negative for cough and shortness of breath.   Cardiovascular: Negative.  Negative for chest pain and leg swelling.  Gastrointestinal: Negative.  Negative for abdominal pain.  Genitourinary: Negative.   Musculoskeletal: Positive for joint pain.  Skin: Positive for itching and rash.  Neurological: Negative.  Negative for sensory change, focal weakness and weakness.  Psychiatric/Behavioral: Negative.  The patient is not nervous/anxious and does not have insomnia.     As per HPI. Otherwise, a complete review of systems is negative.  PAST MEDICAL HISTORY: Past Medical History:  Diagnosis Date  . Abnormal Pap smear of cervix    01/2015 ascus/neg- 04/2015 ascus/neg  . Allergy   . Anxiety   . Arthritis   . Asthma   . Chronic kidney disease   . COPD (chronic obstructive pulmonary disease) ([place])   . DDD (degenerative  disc disease), lumbar   . Depression   . Elevated lipids   . Fibromyalgia   . Fibromyalgia   . Genetic testing 03/09/2017   Multi-Cancer panel (83 genes) @ [facility] - No pathogenic mutations detected  . GERD (gastroesophageal reflux disease)   . History of IBS   . Hyperthyroidism   . Joint disease   . Migraine   . Migraine   . Oxygen deficiency   . Restless leg syndrome   . Sleep apnea    Does not use C-PAP, cannot tolerate mask    PAST SURGICAL HISTORY: Past Surgical History:  Procedure Laterality Date  . ABDOMINAL HYSTERECTOMY  2008  . BREAST BIOPSY Left 02/02/2017   [place] core path pending  . BREAST EXCISIONAL BIOPSY Left 02/17/2017   lumpectomy with nl sn  . BREAST LUMPECTOMY WITH NEEDLE LOCALIZATION Left 02/17/2017   Procedure: BREAST LUMPECTOMY WITH NEEDLE LOCALIZATION;  Surgeon: Vickie Epley, MD;  Location: [facility];  Service: General;  Laterality: Left;  . CARPAL TUNNEL RELEASE Bilateral   . cryotherapy    . DILATION AND CURETTAGE OF UTERUS    . ENDOMETRIAL ABLATION    . LAPAROSCOPIC OOPHERECTOMY Left    unsure which side but thinks its the left  . LYSIS OF ADHESION Left 10/14/2015   Procedure: LYSIS OF ADHESION;  Surgeon: Thornton Park, MD;  Location: [facility];  Service: Orthopedics;  Laterality: Left;  . NECK SURGERY     lower neck fusion rods and screws  . PORTA CATH INSERTION N/A 03/08/2017   Procedure: PORTA CATH INSERTION;  Surgeon: Katha Cabal,  MD;  Location: [place] [facility];  Service: Cardiovascular;  Laterality: N/A;  . RESECTION DISTAL CLAVICAL Left 10/14/2015   Procedure: RESECTION DISTAL CLAVICAL;  Surgeon: Thornton Park, MD;  Location: [facility];  Service: Orthopedics;  Laterality: Left;  . SENTINEL NODE BIOPSY Left 02/17/2017   Procedure: SENTINEL NODE BIOPSY;  Surgeon: Vickie Epley, MD;  Location: [facility];  Service: General;  Laterality: Left;  . SHOULDER ARTHROSCOPY WITH OPEN ROTATOR CUFF REPAIR Left 10/14/2015   Procedure: SHOULDER  ARTHROSCOPY WITH OPEN ROTATOR CUFF REPAIR;  Surgeon: Thornton Park, MD;  Location: [facility];  Service: Orthopedics;  Laterality: Left;  . SHOULDER ARTHROSCOPY WITH OPEN ROTATOR CUFF REPAIR AND DISTAL CLAVICLE ACROMINECTOMY Right 09/03/2014   Procedure: RIGHT SHOULDER ARTHROSCOPY WITH MINI OPEN ROTATOR CUFF TEAR;  Surgeon: Thornton Park, MD;  Location: [facility];  Service: Orthopedics;  Laterality: Right;  biceps tenodesis, arthroscopic subacromial decompression and distal clavicle incision  . spinal injections    . SUBACROMIAL DECOMPRESSION Left 10/14/2015   Procedure: SUBACROMIAL DECOMPRESSION;  Surgeon: Thornton Park, MD;  Location: [facility];  Service: Orthopedics;  Laterality: Left;    FAMILY HISTORY: Family History  Problem Relation Age of Onset  . Breast cancer Mother 46       Glioblastoma also; deceased at 71  . Diabetes Father   . Lung cancer Father        mesothelioma; deceased 59s  . Cancer Maternal Aunt        "bone ca"; unk. primary  . Colon cancer Maternal Grandfather        dx in 14s; deceased 76  . Colon cancer Maternal Aunt        dx in 53s; currently 78s  . Lung cancer Maternal Grandmother        smoker; deceased 22  . Lung cancer Paternal Grandmother        smoker; deceased 49s  . Breast cancer Cousin        dx 18s; daughter of mat aunt with unk. primary cancer  . Cancer Other        distant cousin; unknown primary  . Colon cancer Other        dx 66s; currently 76; maternal half-sister  . Bipolar disorder Sister   . Schizophrenia Sister     ADVANCED DIRECTIVES (Y/N):  N  HEALTH MAINTENANCE: Social History   Tobacco Use  . Smoking status: Current Every Day Smoker    Packs/day: 1.00    Types: Cigarettes  . Smokeless tobacco: Never Used  Substance Use Topics  . Alcohol use: No    Alcohol/week: 0.0 oz  . Drug use: No     Colonoscopy:  PAP:  Bone density:  Lipid panel:  Allergies  Allergen Reactions  . Iodine   . Bee Venom Hives and Rash  .  Cefoxitin Rash  . Cefuroxime Axetil Hives and Rash  . Cucumber Extract Rash  . Gadolinium Derivatives Swelling, Other (See Comments) and Rash    NECK BECAME RED AND TONGUE WAS SWELLING SLIGHTLY PER PT NECK BECAME RED AND TONGUE WAS SWELLING SLIGHTLY PER PT NECK BECAME RED AND TONGUE WAS SWELLING SLIGHTLY PER PT  . Iodinated Diagnostic Agents Hives and Rash  . Latex Rash  . Other Hives and Rash    Bee Stings-swelling/hives/rash Wool (textile fiber)-Rash/itching  . Tomato Rash    Red tomatoes    Current Outpatient Medications  Medication Sig Dispense Refill  . albuterol (ACCUNEB) 0.63 MG/3ML nebulizer solution Take 1 ampule by nebulization  every 6 (six) hours as needed for wheezing.    Marland Kitchen albuterol (PROAIR HFA) 108 (90 Base) MCG/ACT inhaler ProAir HFA 90 mcg/actuation aerosol inhaler    . baclofen (LIORESAL) 10 MG tablet Take 10 mg by mouth 3 (three) times daily as needed for muscle spasms (typically 2-3 times daily).     . cholecalciferol (VITAMIN D) 1000 UNITS tablet Take 1,000 Units by mouth 2 (two) times daily.     . diclofenac sodium (VOLTAREN) 1 % GEL Apply 2 g topically 4 (four) times daily as needed (for pain.).     Marland Kitchen doxepin (SINEQUAN) 10 MG capsule Take 1-2 capsules (10-20 mg total) by mouth at bedtime as needed. 60 capsule 1  . EPINEPHrine (EPIPEN JR) 0.15 MG/0.3ML injection Inject 0.15 mg into the muscle as needed for anaphylaxis.    . hydrocortisone 2.5 % cream Apply 1 application topically 2 (two) times daily as needed (for itchy/dry skin.).    Marland Kitchen lidocaine-prilocaine (EMLA) cream Apply to affected area once 30 g 3  . linaclotide (LINZESS) 145 MCG CAPS capsule Take 145 mcg by mouth daily as needed (for constipation.).    Marland Kitchen magnesium oxide (MAG-OX) 400 MG tablet Take 400 mg by mouth daily. Reported on 07/08/2015    . Multiple Vitamins-Minerals (HAIR SKIN AND NAILS FORMULA) TABS Take 2 tablets by mouth daily.     Marland Kitchen nystatin cream (MYCOSTATIN)     . NYSTATIN powder  APPLY  TOPICALLY FOUR TIMES DAILY 15 g 1  . omeprazole (PRILOSEC) 20 MG capsule Take 20 mg by mouth daily before breakfast.     . ondansetron (ZOFRAN) 8 MG tablet Take 1 tablet (8 mg total) by mouth 2 (two) times daily as needed (Nausea or vomiting). 30 tablet 2  . oxyCODONE 10 MG TABS Take 1 tablet (10 mg total) by mouth every 4 (four) hours as needed for breakthrough pain. 60 tablet 0  . prochlorperazine (COMPAZINE) 10 MG tablet Take 1 tablet (10 mg total) by mouth every 6 (six) hours as needed (Nausea or vomiting). 60 tablet 2  . promethazine (PHENERGAN) 25 MG tablet Take 25 mg by mouth every 6 (six) hours as needed for nausea or vomiting.    . propranolol (INDERAL) 10 MG tablet Take 1 tablet (10 mg total) by mouth 2 (two) times daily as needed. For severe anxiety sx 60 tablet 1  . rizatriptan (MAXALT-MLT) 10 MG disintegrating tablet Take 10 mg by mouth as needed for migraine. May repeat in 2 hours if needed    . rosuvastatin (CRESTOR) 40 MG tablet Take 40 mg by mouth at bedtime.     . topiramate (TOPAMAX) 100 MG tablet Take 200 mg by mouth daily.     Marland Kitchen triamcinolone ointment (KENALOG) 0.5 % Apply 1 application topically 2 (two) times daily. 30 g 0  . vitamin E (VITAMIN E) 200 UNIT capsule Take 200 Units by mouth 4 (four) times a week. Take 4-5 times a week    . doxycycline (VIBRAMYCIN) 100 MG capsule Take 1 capsule (100 mg total) by mouth 2 (two) times daily. 14 capsule 0   No current facility-administered medications for this visit.    Facility-Administered Medications Ordered in Other Visits  Medication Dose Route Frequency Provider Last Rate Last Dose  . heparin lock flush 100 unit/mL  500 Units Intravenous Once Lloyd Huger, MD      . PACLitaxel (TAXOL) 132 mg in sodium chloride 0.9 % 250 mL chemo infusion (</= 46m/m2)  80 mg/m2 (Treatment Plan  Recorded) Intravenous Once Lloyd Huger, MD 272 mL/hr at 05/08/17 1210 132 mg at 05/08/17 1210  . sodium chloride 0.9 % 100 mL with  famotidine (PEPCID) 20 mg infusion   Intravenous Continuous Lloyd Huger, MD 400 mL/hr at 05/08/17 1100      OBJECTIVE: Vitals:   05/08/17 0938 05/08/17 0944  BP:  96/67  Pulse:  (!) 101  Resp: 12   Temp:  98.5 F (36.9 C)     Body mass index is 26.3 kg/m.    ECOG FS:0 - Asymptomatic  General: Well-developed, well-nourished, no acute distress. Eyes: Pink conjunctiva, anicteric sclera. Breast: Exam deferred today. Lungs: Clear to auscultation bilaterally. Heart: Regular rate and rhythm. No rubs, murmurs, or gallops. Abdomen: Soft, nontender, nondistended. No organomegaly noted, normoactive bowel sounds. Musculoskeletal: No edema, cyanosis, or clubbing. Neuro: Alert, answering all questions appropriately. Cranial nerves grossly intact. Skin: No rashes or petechiae noted. Psych: Normal affect.  LAB RESULTS:  Lab Results  Component Value Date   NA 137 05/08/2017   K 3.5 05/08/2017   CL 110 05/08/2017   CO2 21 (L) 05/08/2017   GLUCOSE 127 (H) 05/08/2017   BUN 23 (H) 05/08/2017   CREATININE 0.91 05/08/2017   CALCIUM 9.1 05/08/2017   PROT 6.9 05/08/2017   ALBUMIN 3.7 05/08/2017   AST 19 05/08/2017   ALT 18 05/08/2017   ALKPHOS 80 05/08/2017   BILITOT 0.2 (L) 05/08/2017   GFRNONAA >60 05/08/2017   GFRAA >60 05/08/2017    Lab Results  Component Value Date   WBC 13.0 (H) 05/08/2017   NEUTROABS 9.8 (H) 05/08/2017   HGB 12.7 05/08/2017   HCT 36.6 05/08/2017   MCV 99.0 05/08/2017   PLT 353 05/08/2017     STUDIES: No results found.  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  PLAN:    1. 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  She has also had port placement.  MUGA scan on March 07, 2017 revealed an EF of 54%.  Proceed with cycle 9 of  12 of weekly Taxol and Herceptin today.  Given her difficulty sleeping, dexamethasone was removed as a premedication.  At the conclusion of patient's chemotherapy, she will then proceed with adjuvant XRT as well as Herceptin maintenance for 1 year. Finally, because patient has an ER/PR positive malignancy, she will benefit from an aromatase inhibitor for 5 years at the conclusion of all her treatments.  Return to clinic in 1 week for further evaluation and consideration of cycle 10. 2.  Genetic testing: Negative.  No further interventions are needed. 3.  Joint pain: Continue symptomatic treatment as directed. 4.  Anxiety: Patient was previously given a referral to psychiatry for further evaluation and adjustment of her medications as needed. 5.  Chronic pain: Continue appointments with pain clinic as scheduled. 6.  Difficulty sleeping: Discontinue dexamethasone as above.  Patient was also given a prescription for Ambien. 7.  Rash: Appears to be folliculitis.  Patient was given a 7-day course of doxycycline today.   Patient expressed understanding and was in agreement with this plan. She also understands that She can call clinic at any time with any questions, concerns, or complaints.   Cancer Staging Malignant neoplasm of upper-outer quadrant of left breast in female, estrogen receptor positive ([place]) Staging form: Breast, AJCC 8th Edition -  Clinical stage from 02/07/2017: Stage IA (cT1c, cN0, cM0, G1, ER: Positive, PR: Positive, HER2: Positive) - Signed by Lloyd Huger, MD on 02/11/2017   Lloyd Huger, MD   05/08/2017 1:02 PM

## 2017-05-08 ENCOUNTER — Inpatient Hospital Stay: Payer: Medicare Other

## 2017-05-08 ENCOUNTER — Other Ambulatory Visit: Payer: Medicare Other

## 2017-05-08 ENCOUNTER — Ambulatory Visit: Payer: Medicare Other

## 2017-05-08 ENCOUNTER — Other Ambulatory Visit: Payer: Self-pay

## 2017-05-08 ENCOUNTER — Encounter: Payer: Self-pay | Admitting: Oncology

## 2017-05-08 ENCOUNTER — Ambulatory Visit: Payer: Medicare Other | Admitting: Oncology

## 2017-05-08 ENCOUNTER — Inpatient Hospital Stay (HOSPITAL_BASED_OUTPATIENT_CLINIC_OR_DEPARTMENT_OTHER): Payer: Medicare Other | Admitting: Oncology

## 2017-05-08 VITALS — BP 96/67 | HR 101 | Temp 98.5°F | Resp 12 | Ht 61.0 in | Wt 139.2 lb

## 2017-05-08 DIAGNOSIS — Z79899 Other long term (current) drug therapy: Secondary | ICD-10-CM

## 2017-05-08 DIAGNOSIS — M2550 Pain in unspecified joint: Secondary | ICD-10-CM

## 2017-05-08 DIAGNOSIS — Z170 Estrogen receptor positive status [ER+]: Secondary | ICD-10-CM

## 2017-05-08 DIAGNOSIS — C50412 Malignant neoplasm of upper-outer quadrant of left female breast: Secondary | ICD-10-CM | POA: Diagnosis not present

## 2017-05-08 DIAGNOSIS — F419 Anxiety disorder, unspecified: Secondary | ICD-10-CM | POA: Diagnosis not present

## 2017-05-08 DIAGNOSIS — C50919 Malignant neoplasm of unspecified site of unspecified female breast: Secondary | ICD-10-CM

## 2017-05-08 DIAGNOSIS — F17210 Nicotine dependence, cigarettes, uncomplicated: Secondary | ICD-10-CM

## 2017-05-08 DIAGNOSIS — Z5111 Encounter for antineoplastic chemotherapy: Secondary | ICD-10-CM | POA: Diagnosis not present

## 2017-05-08 DIAGNOSIS — G4730 Sleep apnea, unspecified: Secondary | ICD-10-CM | POA: Diagnosis not present

## 2017-05-08 DIAGNOSIS — R21 Rash and other nonspecific skin eruption: Secondary | ICD-10-CM

## 2017-05-08 LAB — CBC WITH DIFFERENTIAL/PLATELET
Basophils Absolute: 0.1 10*3/uL (ref 0–0.1)
Basophils Relative: 1 %
EOS ABS: 0.3 10*3/uL (ref 0–0.7)
Eosinophils Relative: 2 %
HCT: 36.6 % (ref 35.0–47.0)
HEMOGLOBIN: 12.7 g/dL (ref 12.0–16.0)
LYMPHS ABS: 2.3 10*3/uL (ref 1.0–3.6)
Lymphocytes Relative: 18 %
MCH: 34.4 pg — AB (ref 26.0–34.0)
MCHC: 34.7 g/dL (ref 32.0–36.0)
MCV: 99 fL (ref 80.0–100.0)
MONOS PCT: 5 %
Monocytes Absolute: 0.6 10*3/uL (ref 0.2–0.9)
NEUTROS ABS: 9.8 10*3/uL — AB (ref 1.4–6.5)
NEUTROS PCT: 74 %
Platelets: 353 10*3/uL (ref 150–440)
RBC: 3.7 MIL/uL — AB (ref 3.80–5.20)
RDW: 15.3 % — ABNORMAL HIGH (ref 11.5–14.5)
WBC: 13 10*3/uL — ABNORMAL HIGH (ref 3.6–11.0)

## 2017-05-08 LAB — COMPREHENSIVE METABOLIC PANEL
ALT: 18 U/L (ref 14–54)
ANION GAP: 6 (ref 5–15)
AST: 19 U/L (ref 15–41)
Albumin: 3.7 g/dL (ref 3.5–5.0)
Alkaline Phosphatase: 80 U/L (ref 38–126)
BUN: 23 mg/dL — ABNORMAL HIGH (ref 6–20)
CHLORIDE: 110 mmol/L (ref 101–111)
CO2: 21 mmol/L — AB (ref 22–32)
CREATININE: 0.91 mg/dL (ref 0.44–1.00)
Calcium: 9.1 mg/dL (ref 8.9–10.3)
GFR calc non Af Amer: >60 mL/min (ref >60)
Glucose, Bld: 127 mg/dL — ABNORMAL HIGH (ref 65–99)
POTASSIUM: 3.5 mmol/L (ref 3.5–5.1)
SODIUM: 137 mmol/L (ref 135–145)
Total Bilirubin: 0.2 mg/dL — ABNORMAL LOW (ref 0.3–1.2)
Total Protein: 6.9 g/dL (ref 6.5–8.1)

## 2017-05-08 MED ORDER — TRASTUZUMAB CHEMO 150 MG IV SOLR
2.0000 mg/kg | Freq: Once | INTRAVENOUS | Status: AC
  Administered 2017-05-08: 126 mg via INTRAVENOUS
  Filled 2017-05-08: qty 6

## 2017-05-08 MED ORDER — DOXYCYCLINE HYCLATE 100 MG PO CAPS: 100.0000 mg | ORAL_CAPSULE | Freq: Two times a day (BID) | ORAL | 0 refills | Status: DC

## 2017-05-08 MED ORDER — SODIUM CHLORIDE 0.9% FLUSH
10.0000 mL | Freq: Once | INTRAVENOUS | Status: AC
  Administered 2017-05-08: 10 mL via INTRAVENOUS
  Filled 2017-05-08: qty 10

## 2017-05-08 MED ORDER — HEPARIN SOD (PORK) LOCK FLUSH 100 UNIT/ML IV SOLN
500.0000 [IU] | Freq: Once | INTRAVENOUS | Status: AC
  Administered 2017-05-08: 500 [IU] via INTRAVENOUS
  Filled 2017-05-08: qty 5

## 2017-05-08 MED ORDER — DIPHENHYDRAMINE HCL 50 MG/ML IJ SOLN
25.0000 mg | Freq: Once | INTRAMUSCULAR | Status: AC
  Administered 2017-05-08: 25 mg via INTRAVENOUS
  Filled 2017-05-08: qty 1

## 2017-05-08 MED ORDER — SODIUM CHLORIDE 0.9 % IV SOLN
Freq: Once | INTRAVENOUS | Status: AC
  Administered 2017-05-08: 11:00:00 via INTRAVENOUS
  Filled 2017-05-08: qty 1000

## 2017-05-08 MED ORDER — SODIUM CHLORIDE 0.9 % IV SOLN
INTRAVENOUS | Status: DC
  Administered 2017-05-08: 11:00:00 via INTRAVENOUS
  Filled 2017-05-08 (×2): qty 100

## 2017-05-08 MED ORDER — ACETAMINOPHEN 325 MG PO TABS
650.0000 mg | ORAL_TABLET | Freq: Once | ORAL | Status: AC
  Administered 2017-05-08: 650 mg via ORAL
  Filled 2017-05-08: qty 2

## 2017-05-08 MED ORDER — FAMOTIDINE IN NACL 20-0.9 MG/50ML-% IV SOLN: 20.0000 mg | Freq: Once | INTRAVENOUS | Status: DC

## 2017-05-08 MED ORDER — PACLITAXEL CHEMO INJECTION 300 MG/50ML: 80.0000 mg/m2 | Freq: Once | INTRAVENOUS | Status: DC

## 2017-05-08 MED ORDER — SODIUM CHLORIDE 0.9 % IV SOLN
80.0000 mg/m2 | Freq: Once | INTRAVENOUS | Status: AC
  Administered 2017-05-08: 132 mg via INTRAVENOUS
  Filled 2017-05-08: qty 22

## 2017-05-08 NOTE — Progress Notes (Signed)
Patient here for pre treatment check. She has chronic knee pain.

## 2017-05-12 ENCOUNTER — Other Ambulatory Visit: Payer: Self-pay | Admitting: Oncology

## 2017-05-14 NOTE — Progress Notes (Signed)
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  INTERVAL HISTORY: Patient returns to clinic today for further evaluation and consideration of cycle 10 of 12 of weekly Herceptin and Taxol.  The rash on her head has significantly improved with oral doxycycline, but not completely resolved.  She otherwise continues to tolerate her treatments well and is asymptomatic. She has no neurologic complaints.  She denies any recent fevers or illnesses.  She has a good appetite and denies weight loss.  She has no chest pain, cough, hemoptysis, or shortness of breath.  She denies any nausea, vomiting, constipation, or diarrhea.  She has no urinary complaints.  Patient offers no further specific complaints today.  REVIEW OF SYSTEMS:   Review of Systems  Constitutional: Negative.  Negative for fever, malaise/fatigue and weight loss.  Respiratory: Negative.  Negative for cough and shortness of breath.   Cardiovascular: Negative.  Negative for chest pain and leg swelling.  Gastrointestinal: Negative.  Negative for abdominal pain.  Genitourinary: Negative.  Negative for dysuria.  Musculoskeletal: Positive for joint pain.  Skin: Positive for itching and rash.  Neurological: Negative.  Negative for sensory change, focal weakness and weakness.  Psychiatric/Behavioral: Negative.  The patient is not nervous/anxious and does not have insomnia.     As per HPI. Otherwise, a complete review of systems is negative.  PAST MEDICAL HISTORY: Past Medical History:  Diagnosis Date  . Abnormal Pap smear of cervix    01/2015 ascus/neg- 04/2015 ascus/neg  . Allergy   . Anxiety   . Arthritis   . Asthma   .  Chronic kidney disease   . COPD (chronic obstructive pulmonary disease) ([place])   . DDD (degenerative disc disease), lumbar   . Depression   . Elevated lipids   . Fibromyalgia   . Fibromyalgia   . Genetic testing 03/09/2017   Multi-Cancer panel (83 genes) @ [facility] - No pathogenic mutations detected  . GERD (gastroesophageal reflux disease)   . History of IBS   . Hyperthyroidism   . Joint disease   . Migraine   . Migraine   . Oxygen deficiency   . Restless leg syndrome   . Sleep apnea    Does not use C-PAP, cannot tolerate mask    PAST SURGICAL HISTORY: Past Surgical History:  Procedure Laterality Date  . ABDOMINAL HYSTERECTOMY  2008  . BREAST BIOPSY Left 02/02/2017   [place] core path pending  . BREAST EXCISIONAL BIOPSY Left 02/17/2017   lumpectomy with nl sn  . BREAST LUMPECTOMY WITH NEEDLE LOCALIZATION Left 02/17/2017   Procedure: BREAST LUMPECTOMY WITH NEEDLE LOCALIZATION;  Surgeon: Vickie Epley, MD;  Location: [facility];  Service: General;  Laterality: Left;  . CARPAL TUNNEL RELEASE Bilateral   . cryotherapy    . DILATION AND CURETTAGE OF UTERUS    . ENDOMETRIAL ABLATION    . LAPAROSCOPIC OOPHERECTOMY Left    unsure which side but thinks its the left  . LYSIS OF ADHESION Left 10/14/2015   Procedure: LYSIS OF ADHESION;  Surgeon: Thornton Park, MD;  Location: [facility];  Service: Orthopedics;  Laterality: Left;  . NECK SURGERY     lower neck fusion rods and screws  . PORTA CATH INSERTION N/A 03/08/2017  Procedure: PORTA CATH INSERTION;  Surgeon: Katha Cabal, MD;  Location: [place] [facility];  Service: Cardiovascular;  Laterality: N/A;  . RESECTION DISTAL CLAVICAL Left 10/14/2015   Procedure: RESECTION DISTAL CLAVICAL;  Surgeon: Thornton Park, MD;  Location: [facility];  Service: Orthopedics;  Laterality: Left;  . SENTINEL NODE BIOPSY Left 02/17/2017   Procedure: SENTINEL NODE BIOPSY;  Surgeon: Vickie Epley, MD;  Location: [facility];  Service: General;   Laterality: Left;  . SHOULDER ARTHROSCOPY WITH OPEN ROTATOR CUFF REPAIR Left 10/14/2015   Procedure: SHOULDER ARTHROSCOPY WITH OPEN ROTATOR CUFF REPAIR;  Surgeon: Thornton Park, MD;  Location: [facility];  Service: Orthopedics;  Laterality: Left;  . SHOULDER ARTHROSCOPY WITH OPEN ROTATOR CUFF REPAIR AND DISTAL CLAVICLE ACROMINECTOMY Right 09/03/2014   Procedure: RIGHT SHOULDER ARTHROSCOPY WITH MINI OPEN ROTATOR CUFF TEAR;  Surgeon: Thornton Park, MD;  Location: [facility];  Service: Orthopedics;  Laterality: Right;  biceps tenodesis, arthroscopic subacromial decompression and distal clavicle incision  . spinal injections    . SUBACROMIAL DECOMPRESSION Left 10/14/2015   Procedure: SUBACROMIAL DECOMPRESSION;  Surgeon: Thornton Park, MD;  Location: [facility];  Service: Orthopedics;  Laterality: Left;    FAMILY HISTORY: Family History  Problem Relation Age of Onset  . Breast cancer Mother 31       Glioblastoma also; deceased at 45  . Diabetes Father   . Lung cancer Father        mesothelioma; deceased 24s  . Cancer Maternal Aunt        "bone ca"; unk. primary  . Colon cancer Maternal Grandfather        dx in 7s; deceased 77  . Colon cancer Maternal Aunt        dx in 77s; currently 83s  . Lung cancer Maternal Grandmother        smoker; deceased 51  . Lung cancer Paternal Grandmother        smoker; deceased 61s  . Breast cancer Cousin        dx 8s; daughter of mat aunt with unk. primary cancer  . Cancer Other        distant cousin; unknown primary  . Colon cancer Other        dx 63s; currently 74; maternal half-sister  . Bipolar disorder Sister   . Schizophrenia Sister     ADVANCED DIRECTIVES (Y/N):  N  HEALTH MAINTENANCE: Social History   Tobacco Use  . Smoking status: Current Every Day Smoker    Packs/day: 1.00    Types: Cigarettes  . Smokeless tobacco: Never Used  Substance Use Topics  . Alcohol use: No    Alcohol/week: 0.0 oz  . Drug use: No      Colonoscopy:  PAP:  Bone density:  Lipid panel:  Allergies  Allergen Reactions  . Iodine   . Bee Venom Hives and Rash  . Cefoxitin Rash  . Cefuroxime Axetil Hives and Rash  . Cucumber Extract Rash  . Gadolinium Derivatives Swelling, Other (See Comments) and Rash    NECK BECAME RED AND TONGUE WAS SWELLING SLIGHTLY PER PT NECK BECAME RED AND TONGUE WAS SWELLING SLIGHTLY PER PT NECK BECAME RED AND TONGUE WAS SWELLING SLIGHTLY PER PT  . Iodinated Diagnostic Agents Hives and Rash  . Latex Rash  . Other Hives and Rash    Bee Stings-swelling/hives/rash Wool (textile fiber)-Rash/itching  . Tomato Rash    Red tomatoes    Current Outpatient Medications  Medication Sig Dispense Refill  . albuterol (ACCUNEB)  0.63 MG/3ML nebulizer solution Take 1 ampule by nebulization every 6 (six) hours as needed for wheezing.    Marland Kitchen albuterol (PROAIR HFA) 108 (90 Base) MCG/ACT inhaler ProAir HFA 90 mcg/actuation aerosol inhaler    . baclofen (LIORESAL) 10 MG tablet Take 10 mg by mouth 3 (three) times daily as needed for muscle spasms (typically 2-3 times daily).     . cholecalciferol (VITAMIN D) 1000 UNITS tablet Take 1,000 Units by mouth 2 (two) times daily.     . diclofenac sodium (VOLTAREN) 1 % GEL Apply 2 g topically 4 (four) times daily as needed (for pain.).     Marland Kitchen doxepin (SINEQUAN) 10 MG capsule Take 1-2 capsules (10-20 mg total) by mouth at bedtime as needed. 60 capsule 1  . doxycycline (VIBRAMYCIN) 100 MG capsule Take 1 capsule (100 mg total) by mouth 2 (two) times daily. 14 capsule 0  . EPINEPHrine (EPIPEN JR) 0.15 MG/0.3ML injection Inject 0.15 mg into the muscle as needed for anaphylaxis.    . hydrocortisone 2.5 % cream Apply 1 application topically 2 (two) times daily as needed (for itchy/dry skin.).    Marland Kitchen lidocaine-prilocaine (EMLA) cream Apply to affected area once 30 g 3  . linaclotide (LINZESS) 145 MCG CAPS capsule Take 145 mcg by mouth daily as needed (for constipation.).    Marland Kitchen  magnesium oxide (MAG-OX) 400 MG tablet Take 400 mg by mouth daily. Reported on 07/08/2015    . Multiple Vitamins-Minerals (HAIR SKIN AND NAILS FORMULA) TABS Take 2 tablets by mouth daily.     Marland Kitchen nystatin cream (MYCOSTATIN)     . NYSTATIN powder  APPLY TOPICALLY FOUR TIMES DAILY 15 g 1  . omeprazole (PRILOSEC) 20 MG capsule Take 20 mg by mouth daily before breakfast.     . ondansetron (ZOFRAN) 8 MG tablet Take 1 tablet (8 mg total) by mouth 2 (two) times daily as needed (Nausea or vomiting). 30 tablet 2  . oxyCODONE 10 MG TABS Take 1 tablet (10 mg total) by mouth every 4 (four) hours as needed for breakthrough pain. 60 tablet 0  . prochlorperazine (COMPAZINE) 10 MG tablet Take 1 tablet (10 mg total) by mouth every 6 (six) hours as needed (Nausea or vomiting). 60 tablet 2  . promethazine (PHENERGAN) 25 MG tablet Take 25 mg by mouth every 6 (six) hours as needed for nausea or vomiting.    . propranolol (INDERAL) 10 MG tablet Take 1 tablet (10 mg total) by mouth 2 (two) times daily as needed. For severe anxiety sx 60 tablet 1  . rizatriptan (MAXALT-MLT) 10 MG disintegrating tablet Take 10 mg by mouth as needed for migraine. May repeat in 2 hours if needed    . rosuvastatin (CRESTOR) 40 MG tablet Take 40 mg by mouth at bedtime.     . topiramate (TOPAMAX) 100 MG tablet Take 200 mg by mouth daily.     Marland Kitchen triamcinolone ointment (KENALOG) 0.5 % Apply 1 application topically 2 (two) times daily. 30 g 0  . vitamin E (VITAMIN E) 200 UNIT capsule Take 200 Units by mouth 4 (four) times a week. Take 4-5 times a week     No current facility-administered medications for this visit.    Facility-Administered Medications Ordered in Other Visits  Medication Dose Route Frequency Provider Last Rate Last Dose  . 0.9 %  sodium chloride infusion   Intravenous Once Lloyd Huger, MD      . acetaminophen (TYLENOL) tablet 650 mg  650 mg Oral Once [place],  Kathlene November, MD      . diphenhydrAMINE (BENADRYL) injection 25 mg   25 mg Intravenous Once Lloyd Huger, MD      . famotidine (PEPCID) IVPB 20 mg premix  20 mg Intravenous Once Lloyd Huger, MD      . heparin lock flush 100 unit/mL  500 Units Intravenous Once Lloyd Huger, MD      . PACLitaxel (TAXOL) 132 mg in sodium chloride 0.9 % 250 mL chemo infusion (</= 32m/m2)  80 mg/m2 (Treatment Plan Recorded) Intravenous Once FLloyd Huger MD      . sodium chloride flush (NS) 0.9 % injection 10 mL  10 mL Intravenous PRN FLloyd Huger MD   10 mL at 05/15/17 0936  . trastuzumab (HERCEPTIN) 126 mg in sodium chloride 0.9 % 250 mL chemo infusion  2 mg/kg (Treatment Plan Recorded) Intravenous Once FLloyd Huger MD        OBJECTIVE: Vitals:   05/15/17 0953 05/15/17 0958  BP:  102/70  Pulse:  96  Resp: 12   Temp:  97.6 F (36.4 C)     Body mass index is 26.28 kg/m.    ECOG FS:0 - Asymptomatic  General: Well-developed, well-nourished, no acute distress. Eyes: Pink conjunctiva, anicteric sclera. HEENT: Normocephalic. Lungs: Clear to auscultation bilaterally. Heart: Regular rate and rhythm. No rubs, murmurs, or gallops. Abdomen: Soft, nontender, nondistended. No organomegaly noted, normoactive bowel sounds. Musculoskeletal: No edema, cyanosis, or clubbing. Neuro: Alert, answering all questions appropriately. Cranial nerves grossly intact. Skin: Maculopapular rash on head nearly resolved. Psych: Normal affect.  LAB RESULTS:  Lab Results  Component Value Date   NA 139 05/15/2017   K 3.8 05/15/2017   CL 114 (H) 05/15/2017   CO2 19 (L) 05/15/2017   GLUCOSE 90 05/15/2017   BUN 28 (H) 05/15/2017   CREATININE 0.97 05/15/2017   CALCIUM 9.2 05/15/2017   PROT 6.7 05/15/2017   ALBUMIN 3.6 05/15/2017   AST 23 05/15/2017   ALT 26 05/15/2017   ALKPHOS 80 05/15/2017   BILITOT 0.3 05/15/2017   GFRNONAA >60 05/15/2017   GFRAA >60 05/15/2017    Lab Results  Component Value Date   WBC 10.9 05/15/2017   NEUTROABS 7.6 (H)  05/15/2017   HGB 12.7 05/15/2017   HCT 36.2 05/15/2017   MCV 99.7 05/15/2017   PLT 338 05/15/2017     STUDIES: No results found.  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  PLAN:    1. 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  She has also had port placement.  MUGA scan on March 07, 2017 revealed an EF of 54%.  Repeat in May 2019.  Proceed with cycle 10 of 12 of weekly Taxol and Herceptin today.  Given her difficulty sleeping, dexamethasone was removed as a premedication.  At the conclusion of patient's chemotherapy, she will then proceed with adjuvant XRT as well as Herceptin maintenance for 1 year. Finally, because patient has an ER/PR positive malignancy, she will benefit from an aromatase inhibitor for 5 years at the conclusion of all her treatments.  Return to clinic in 1 week for further evaluation and consideration of cycle 11. 2.  Genetic testing: Negative.  No further interventions are needed. 3.  Joint pain: Continue symptomatic treatment as directed. 4.  Anxiety: Patient was  previously given a referral to psychiatry for further evaluation and adjustment of her medications as needed. 5.  Chronic pain: Continue appointments with pain clinic as scheduled. 6.  Difficulty sleeping: Discontinue dexamethasone as above.  Patient was also given a prescription for Ambien. 7.  Rash: Improved, but not resolved.  Patient was given a second 7-day course of doxycycline.  Approximately 30 minutes spent in discussion of which greater than 50% was consultation.   Patient expressed understanding and was in agreement with this plan. She also understands that She can call clinic at any time with any questions, concerns, or complaints.   Cancer Staging Malignant neoplasm of  upper-outer quadrant of left breast in female, estrogen receptor positive ([place]) Staging form: Breast, AJCC 8th Edition - Clinical stage from 02/07/2017: Stage IA (cT1c, cN0, cM0, G1, ER: Positive, PR: Positive, HER2: Positive) - Signed by Lloyd Huger, MD on 02/11/2017   Lloyd Huger, MD   05/15/2017 10:46 AM

## 2017-05-15 ENCOUNTER — Encounter: Payer: Self-pay | Admitting: Oncology

## 2017-05-15 ENCOUNTER — Inpatient Hospital Stay (HOSPITAL_BASED_OUTPATIENT_CLINIC_OR_DEPARTMENT_OTHER): Payer: Medicare Other | Admitting: Oncology

## 2017-05-15 ENCOUNTER — Inpatient Hospital Stay: Payer: Medicare Other

## 2017-05-15 ENCOUNTER — Other Ambulatory Visit: Payer: Self-pay

## 2017-05-15 VITALS — BP 98/60 | HR 92 | Resp 20

## 2017-05-15 VITALS — BP 102/70 | HR 96 | Temp 97.6°F | Resp 12 | Ht 61.0 in | Wt 139.1 lb

## 2017-05-15 DIAGNOSIS — Z170 Estrogen receptor positive status [ER+]: Secondary | ICD-10-CM

## 2017-05-15 DIAGNOSIS — M2550 Pain in unspecified joint: Secondary | ICD-10-CM

## 2017-05-15 DIAGNOSIS — F419 Anxiety disorder, unspecified: Secondary | ICD-10-CM

## 2017-05-15 DIAGNOSIS — F17210 Nicotine dependence, cigarettes, uncomplicated: Secondary | ICD-10-CM

## 2017-05-15 DIAGNOSIS — R21 Rash and other nonspecific skin eruption: Secondary | ICD-10-CM | POA: Diagnosis not present

## 2017-05-15 DIAGNOSIS — C50412 Malignant neoplasm of upper-outer quadrant of left female breast: Secondary | ICD-10-CM

## 2017-05-15 DIAGNOSIS — G4730 Sleep apnea, unspecified: Secondary | ICD-10-CM

## 2017-05-15 DIAGNOSIS — Z79899 Other long term (current) drug therapy: Secondary | ICD-10-CM | POA: Diagnosis not present

## 2017-05-15 DIAGNOSIS — Z5111 Encounter for antineoplastic chemotherapy: Secondary | ICD-10-CM | POA: Diagnosis not present

## 2017-05-15 DIAGNOSIS — C50919 Malignant neoplasm of unspecified site of unspecified female breast: Secondary | ICD-10-CM

## 2017-05-15 LAB — CBC WITH DIFFERENTIAL/PLATELET
Basophils Absolute: 0 10*3/uL (ref 0–0.1)
Basophils Relative: 0 %
EOS ABS: 0.4 10*3/uL (ref 0–0.7)
Eosinophils Relative: 4 %
HCT: 36.2 % (ref 35.0–47.0)
Hemoglobin: 12.7 g/dL (ref 12.0–16.0)
LYMPHS ABS: 2.4 10*3/uL (ref 1.0–3.6)
LYMPHS PCT: 22 %
MCH: 34.8 pg — AB (ref 26.0–34.0)
MCHC: 34.9 g/dL (ref 32.0–36.0)
MCV: 99.7 fL (ref 80.0–100.0)
MONO ABS: 0.5 10*3/uL (ref 0.2–0.9)
Monocytes Relative: 4 %
Neutro Abs: 7.6 10*3/uL — ABNORMAL HIGH (ref 1.4–6.5)
Neutrophils Relative %: 70 %
Platelets: 338 10*3/uL (ref 150–440)
RBC: 3.63 MIL/uL — ABNORMAL LOW (ref 3.80–5.20)
RDW: 15.7 % — AB (ref 11.5–14.5)
WBC: 10.9 10*3/uL (ref 3.6–11.0)

## 2017-05-15 LAB — COMPREHENSIVE METABOLIC PANEL
ALBUMIN: 3.6 g/dL (ref 3.5–5.0)
ALT: 26 U/L (ref 14–54)
AST: 23 U/L (ref 15–41)
Alkaline Phosphatase: 80 U/L (ref 38–126)
Anion gap: 6 (ref 5–15)
BUN: 28 mg/dL — AB (ref 6–20)
CHLORIDE: 114 mmol/L — AB (ref 101–111)
CO2: 19 mmol/L — AB (ref 22–32)
CREATININE: 0.97 mg/dL (ref 0.44–1.00)
Calcium: 9.2 mg/dL (ref 8.9–10.3)
GFR calc Af Amer: >60 mL/min (ref >60)
GLUCOSE: 90 mg/dL (ref 65–99)
Potassium: 3.8 mmol/L (ref 3.5–5.1)
Sodium: 139 mmol/L (ref 135–145)
Total Bilirubin: 0.3 mg/dL (ref 0.3–1.2)
Total Protein: 6.7 g/dL (ref 6.5–8.1)

## 2017-05-15 MED ORDER — SODIUM CHLORIDE 0.9% FLUSH
10.0000 mL | INTRAVENOUS | Status: DC | PRN
  Administered 2017-05-15: 10 mL via INTRAVENOUS
  Filled 2017-05-15: qty 10

## 2017-05-15 MED ORDER — DIPHENHYDRAMINE HCL 50 MG/ML IJ SOLN
25.0000 mg | Freq: Once | INTRAMUSCULAR | Status: AC
  Administered 2017-05-15: 25 mg via INTRAVENOUS
  Filled 2017-05-15: qty 1

## 2017-05-15 MED ORDER — DOXYCYCLINE HYCLATE 100 MG PO CAPS
100.0000 mg | ORAL_CAPSULE | Freq: Two times a day (BID) | ORAL | 0 refills | Status: DC
Start: 2017-05-15 — End: 2017-06-19

## 2017-05-15 MED ORDER — PACLITAXEL CHEMO INJECTION 300 MG/50ML
80.0000 mg/m2 | Freq: Once | INTRAVENOUS | Status: AC
  Administered 2017-05-15: 132 mg via INTRAVENOUS
  Filled 2017-05-15: qty 22

## 2017-05-15 MED ORDER — HEPARIN SOD (PORK) LOCK FLUSH 100 UNIT/ML IV SOLN
500.0000 [IU] | Freq: Once | INTRAVENOUS | Status: AC
  Administered 2017-05-15: 500 [IU] via INTRAVENOUS
  Filled 2017-05-15: qty 5

## 2017-05-15 MED ORDER — TRASTUZUMAB CHEMO 150 MG IV SOLR
2.0000 mg/kg | Freq: Once | INTRAVENOUS | Status: AC
  Administered 2017-05-15: 126 mg via INTRAVENOUS
  Filled 2017-05-15: qty 6

## 2017-05-15 MED ORDER — ACETAMINOPHEN 325 MG PO TABS
650.0000 mg | ORAL_TABLET | Freq: Once | ORAL | Status: AC
  Administered 2017-05-15: 650 mg via ORAL
  Filled 2017-05-15: qty 2

## 2017-05-15 MED ORDER — FAMOTIDINE IN NACL 20-0.9 MG/50ML-% IV SOLN
20.0000 mg | Freq: Once | INTRAVENOUS | Status: AC
  Administered 2017-05-15: 20 mg via INTRAVENOUS
  Filled 2017-05-15: qty 50

## 2017-05-15 MED ORDER — SODIUM CHLORIDE 0.9 % IV SOLN
Freq: Once | INTRAVENOUS | Status: AC
  Administered 2017-05-15: 11:00:00 via INTRAVENOUS
  Filled 2017-05-15: qty 1000

## 2017-05-21 NOTE — Progress Notes (Signed)
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  INTERVAL HISTORY: Patient returns to clinic today for further evaluation and consideration of cycle 11 of weekly Herceptin and Taxol.  The rash on her head has now resolved.  She currently feels well and is asymptomatic. She has no neurologic complaints.  She denies any recent fevers or illnesses.  She has a good appetite and denies weight loss.  She has no chest pain, cough, hemoptysis, or shortness of breath. She denies any nausea, vomiting, constipation, or diarrhea.  She has no urinary complaints.  Patient offers no specific complaints today.  REVIEW OF SYSTEMS:   Review of Systems  Constitutional: Negative.  Negative for fever, malaise/fatigue and weight loss.  Respiratory: Negative.  Negative for cough and shortness of breath.   Cardiovascular: Negative.  Negative for chest pain and leg swelling.  Gastrointestinal: Negative.  Negative for abdominal pain.  Genitourinary: Negative.  Negative for dysuria.  Musculoskeletal: Negative.  Negative for joint pain.  Skin: Negative.  Negative for itching and rash.  Neurological: Negative.  Negative for sensory change, focal weakness and weakness.  Psychiatric/Behavioral: Negative.  The patient is not nervous/anxious and does not have insomnia.     As per HPI. Otherwise, a complete review of systems is negative.  PAST MEDICAL HISTORY: Past Medical History:  Diagnosis Date  . Abnormal Pap smear of cervix    01/2015 ascus/neg- 04/2015 ascus/neg  . Allergy   . Anxiety   . Arthritis   . Asthma   . Chronic kidney disease   . COPD (chronic obstructive pulmonary disease) ([place])   . DDD  (degenerative disc disease), lumbar   . Depression   . Elevated lipids   . Fibromyalgia   . Fibromyalgia   . Genetic testing 03/09/2017   Multi-Cancer panel (83 genes) @ [facility] - No pathogenic mutations detected  . GERD (gastroesophageal reflux disease)   . History of IBS   . Hyperthyroidism   . Joint disease   . Migraine   . Migraine   . Oxygen deficiency   . Restless leg syndrome   . Sleep apnea    Does not use C-PAP, cannot tolerate mask    PAST SURGICAL HISTORY: Past Surgical History:  Procedure Laterality Date  . ABDOMINAL HYSTERECTOMY  2008  . BREAST BIOPSY Left 02/02/2017   [place] core path pending  . BREAST EXCISIONAL BIOPSY Left 02/17/2017   lumpectomy with nl sn  . BREAST LUMPECTOMY WITH NEEDLE LOCALIZATION Left 02/17/2017   Procedure: BREAST LUMPECTOMY WITH NEEDLE LOCALIZATION;  Surgeon: Vickie Epley, MD;  Location: [facility];  Service: General;  Laterality: Left;  . CARPAL TUNNEL RELEASE Bilateral   . cryotherapy    . DILATION AND CURETTAGE OF UTERUS    . ENDOMETRIAL ABLATION    . LAPAROSCOPIC OOPHERECTOMY Left    unsure which side but thinks its the left  . LYSIS OF ADHESION Left 10/14/2015   Procedure: LYSIS OF ADHESION;  Surgeon: Thornton Park, MD;  Location: [facility];  Service: Orthopedics;  Laterality: Left;  . NECK SURGERY     lower neck fusion rods and screws  . PORTA CATH INSERTION N/A 03/08/2017   Procedure: PORTA CATH INSERTION;  Surgeon: Katha Cabal,  MD;  Location: [facility];  Service: Cardiovascular;  Laterality: N/A;  . RESECTION DISTAL CLAVICAL Left 10/14/2015   Procedure: RESECTION DISTAL CLAVICAL;  Surgeon: Thornton Park, MD;  Location: [facility];  Service: Orthopedics;  Laterality: Left;  . SENTINEL NODE BIOPSY Left 02/17/2017   Procedure: SENTINEL NODE BIOPSY;  Surgeon: Vickie Epley, MD;  Location: [facility];  Service: General;  Laterality: Left;  . SHOULDER ARTHROSCOPY WITH OPEN ROTATOR CUFF REPAIR Left 10/14/2015    Procedure: SHOULDER ARTHROSCOPY WITH OPEN ROTATOR CUFF REPAIR;  Surgeon: Thornton Park, MD;  Location: [facility];  Service: Orthopedics;  Laterality: Left;  . SHOULDER ARTHROSCOPY WITH OPEN ROTATOR CUFF REPAIR AND DISTAL CLAVICLE ACROMINECTOMY Right 09/03/2014   Procedure: RIGHT SHOULDER ARTHROSCOPY WITH MINI OPEN ROTATOR CUFF TEAR;  Surgeon: Thornton Park, MD;  Location: [facility];  Service: Orthopedics;  Laterality: Right;  biceps tenodesis, arthroscopic subacromial decompression and distal clavicle incision  . spinal injections    . SUBACROMIAL DECOMPRESSION Left 10/14/2015   Procedure: SUBACROMIAL DECOMPRESSION;  Surgeon: Thornton Park, MD;  Location: [facility];  Service: Orthopedics;  Laterality: Left;    FAMILY HISTORY: Family History  Problem Relation Age of Onset  . Breast cancer Mother 60       Glioblastoma also; deceased at 70  . Diabetes Father   . Lung cancer Father        mesothelioma; deceased 54s  . Cancer Maternal Aunt        "bone ca"; unk. primary  . Colon cancer Maternal Grandfather        dx in 43s; deceased 35  . Colon cancer Maternal Aunt        dx in 3s; currently 62s  . Lung cancer Maternal Grandmother        smoker; deceased 53  . Lung cancer Paternal Grandmother        smoker; deceased 92s  . Breast cancer Cousin        dx 66s; daughter of mat aunt with unk. primary cancer  . Cancer Other        distant cousin; unknown primary  . Colon cancer Other        dx 42s; currently 67; maternal half-sister  . Bipolar disorder Sister   . Schizophrenia Sister     ADVANCED DIRECTIVES (Y/N):  N  HEALTH MAINTENANCE: Social History   Tobacco Use  . Smoking status: Current Every Day Smoker    Packs/day: 1.00    Types: Cigarettes  . Smokeless tobacco: Never Used  Substance Use Topics  . Alcohol use: No    Alcohol/week: 0.0 oz  . Drug use: No     Colonoscopy:  PAP:  Bone density:  Lipid panel:  Allergies  Allergen Reactions  . Iodine   . Bee Venom  Hives and Rash  . Cefoxitin Rash  . Cefuroxime Axetil Hives and Rash  . Cucumber Extract Rash  . Gadolinium Derivatives Swelling, Other (See Comments) and Rash    NECK BECAME RED AND TONGUE WAS SWELLING SLIGHTLY PER PT NECK BECAME RED AND TONGUE WAS SWELLING SLIGHTLY PER PT NECK BECAME RED AND TONGUE WAS SWELLING SLIGHTLY PER PT  . Iodinated Diagnostic Agents Hives and Rash  . Latex Rash  . Other Hives and Rash    Bee Stings-swelling/hives/rash Wool (textile fiber)-Rash/itching  . Tomato Rash    Red tomatoes    Current Outpatient Medications  Medication Sig Dispense Refill  . albuterol (PROAIR HFA) 108 (90 Base) MCG/ACT inhaler ProAir HFA 90  mcg/actuation aerosol inhaler    . baclofen (LIORESAL) 10 MG tablet Take 10 mg by mouth 3 (three) times daily as needed for muscle spasms (typically 2-3 times daily).     . cholecalciferol (VITAMIN D) 1000 UNITS tablet Take 1,000 Units by mouth 2 (two) times daily.     Marland Kitchen doxepin (SINEQUAN) 10 MG capsule Take 1-2 capsules (10-20 mg total) by mouth at bedtime as needed. 60 capsule 1  . doxycycline (VIBRAMYCIN) 100 MG capsule Take 1 capsule (100 mg total) by mouth 2 (two) times daily. 14 capsule 0  . EPINEPHrine (EPIPEN JR) 0.15 MG/0.3ML injection Inject 0.15 mg into the muscle as needed for anaphylaxis.    Marland Kitchen lidocaine-prilocaine (EMLA) cream Apply to affected area once 30 g 3  . magnesium oxide (MAG-OX) 400 MG tablet Take 400 mg by mouth daily. Reported on 07/08/2015    . Multiple Vitamins-Minerals (HAIR SKIN AND NAILS FORMULA) TABS Take 2 tablets by mouth daily.     Marland Kitchen nystatin cream (MYCOSTATIN)     . NYSTATIN powder  APPLY TOPICALLY FOUR TIMES DAILY 15 g 1  . omeprazole (PRILOSEC) 20 MG capsule Take 20 mg by mouth daily before breakfast.     . ondansetron (ZOFRAN) 8 MG tablet Take 1 tablet (8 mg total) by mouth 2 (two) times daily as needed (Nausea or vomiting). 30 tablet 2  . oxyCODONE 10 MG TABS Take 1 tablet (10 mg total) by mouth every 4 (four)  hours as needed for breakthrough pain. 60 tablet 0  . prochlorperazine (COMPAZINE) 10 MG tablet Take 1 tablet (10 mg total) by mouth every 6 (six) hours as needed (Nausea or vomiting). 60 tablet 2  . propranolol (INDERAL) 10 MG tablet Take 1 tablet (10 mg total) by mouth 2 (two) times daily as needed. For severe anxiety sx 60 tablet 1  . rizatriptan (MAXALT-MLT) 10 MG disintegrating tablet Take 10 mg by mouth as needed for migraine. May repeat in 2 hours if needed    . rosuvastatin (CRESTOR) 40 MG tablet Take 40 mg by mouth at bedtime.     . topiramate (TOPAMAX) 100 MG tablet Take 200 mg by mouth daily.     Marland Kitchen triamcinolone ointment (KENALOG) 0.5 % Apply 1 application topically 2 (two) times daily. 30 g 0  . vitamin E (VITAMIN E) 200 UNIT capsule Take 200 Units by mouth 4 (four) times a week. Take 4-5 times a week    . albuterol (ACCUNEB) 0.63 MG/3ML nebulizer solution Take 1 ampule by nebulization every 6 (six) hours as needed for wheezing.    . diclofenac sodium (VOLTAREN) 1 % GEL Apply 2 g topically 4 (four) times daily as needed (for pain.).     Marland Kitchen hydrocortisone 2.5 % cream Apply 1 application topically 2 (two) times daily as needed (for itchy/dry skin.).    Marland Kitchen linaclotide (LINZESS) 145 MCG CAPS capsule Take 145 mcg by mouth daily as needed (for constipation.).    Marland Kitchen promethazine (PHENERGAN) 25 MG tablet Take 25 mg by mouth every 6 (six) hours as needed for nausea or vomiting.     No current facility-administered medications for this visit.     OBJECTIVE: Vitals:   05/22/17 0921  BP: 90/67  Pulse: 91  Resp: 18  Temp: (!) 97.3 F (36.3 C)     Body mass index is 27.09 kg/m.    ECOG FS:0 - Asymptomatic  General: Well-developed, well-nourished, no acute distress. Eyes: Pink conjunctiva, anicteric sclera. HEENT: Normocephalic, moist mucous membranes, clear  oropharnyx. Lungs: Clear to auscultation bilaterally. Heart: Regular rate and rhythm. No rubs, murmurs, or gallops. Abdomen: Soft,  nontender, nondistended. No organomegaly noted, normoactive bowel sounds. Musculoskeletal: No edema, cyanosis, or clubbing. Neuro: Alert, answering all questions appropriately. Cranial nerves grossly intact. Skin: Rash on head resolved. Psych: Normal affect.  LAB RESULTS:  Lab Results  Component Value Date   NA 140 05/22/2017   K 3.3 (L) 05/22/2017   CL 113 (H) 05/22/2017   CO2 19 (L) 05/22/2017   GLUCOSE 172 (H) 05/22/2017   BUN 30 (H) 05/22/2017   CREATININE 1.02 (H) 05/22/2017   CALCIUM 9.0 05/22/2017   PROT 6.2 (L) 05/22/2017   ALBUMIN 3.3 (L) 05/22/2017   AST 20 05/22/2017   ALT 16 05/22/2017   ALKPHOS 80 05/22/2017   BILITOT 0.2 (L) 05/22/2017   GFRNONAA >60 05/22/2017   GFRAA >60 05/22/2017    Lab Results  Component Value Date   WBC 9.7 05/22/2017   NEUTROABS 5.9 05/22/2017   HGB 11.8 (L) 05/22/2017   HCT 34.1 (L) 05/22/2017   MCV 100.6 (H) 05/22/2017   PLT 308 05/22/2017     STUDIES: No results found.  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  PLAN:    1. 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  MUGA scan on March 07, 2017 revealed an EF of 54%.  Repeat in May 2019.  Proceed with cycle 11 of 12 of weekly Taxol and Herceptin today.  Previously, dexamethasone was discontinued secondary to significant insomnia.  Patient was given a referral to radiation oncology today for consideration of adjuvant XRT.  Finally, because patient has an ER/PR positive malignancy, she will benefit from an aromatase inhibitor for 5 years at the conclusion of all her treatments.  Return to clinic in 1 week for consideration of cycle 12 and then in 4 weeks for further evaluation and continuation of maintenance Herceptin.   2.  Genetic testing: Negative.  No  further interventions are needed. 3.  Joint pain: Patient does not complain of this today.  Continue symptomatic treatment as directed. 4.  Anxiety: Continue evaluation and treatment per psychiatry. 5.  Chronic pain: Chronic.  Continue appointments with pain clinic as scheduled. 6.  Difficulty sleeping: Discontinue dexamethasone as above.  Continue Ambien as needed. 7.  Rash: Resolved.  Approximate 30 minutes was spent in discussion of which greater than 50% was consultation.  Patient expressed understanding and was in agreement with this plan. She also understands that She can call clinic at any time with any questions, concerns, or complaints.   Cancer Staging Malignant neoplasm of upper-outer quadrant of left breast in female, estrogen receptor positive ([place]) Staging form: Breast, AJCC 8th Edition - Clinical stage from 02/07/2017: Stage IA (cT1c, cN0, cM0, G1, ER: Positive, PR: Positive, HER2: Positive) - Signed by Lloyd Huger, MD on 02/11/2017   Lloyd Huger, MD   05/24/2017 9:07 AM

## 2017-05-22 ENCOUNTER — Inpatient Hospital Stay: Payer: Medicare Other

## 2017-05-22 ENCOUNTER — Inpatient Hospital Stay (HOSPITAL_BASED_OUTPATIENT_CLINIC_OR_DEPARTMENT_OTHER): Payer: Medicare Other | Admitting: Oncology

## 2017-05-22 ENCOUNTER — Encounter: Payer: Self-pay | Admitting: Oncology

## 2017-05-22 VITALS — BP 90/67 | HR 91 | Temp 97.3°F | Resp 18 | Wt 143.4 lb

## 2017-05-22 DIAGNOSIS — F17210 Nicotine dependence, cigarettes, uncomplicated: Secondary | ICD-10-CM

## 2017-05-22 DIAGNOSIS — F419 Anxiety disorder, unspecified: Secondary | ICD-10-CM | POA: Diagnosis not present

## 2017-05-22 DIAGNOSIS — M2550 Pain in unspecified joint: Secondary | ICD-10-CM

## 2017-05-22 DIAGNOSIS — Z170 Estrogen receptor positive status [ER+]: Secondary | ICD-10-CM

## 2017-05-22 DIAGNOSIS — G4730 Sleep apnea, unspecified: Secondary | ICD-10-CM | POA: Diagnosis not present

## 2017-05-22 DIAGNOSIS — C50919 Malignant neoplasm of unspecified site of unspecified female breast: Secondary | ICD-10-CM

## 2017-05-22 DIAGNOSIS — Z79899 Other long term (current) drug therapy: Secondary | ICD-10-CM

## 2017-05-22 DIAGNOSIS — C50412 Malignant neoplasm of upper-outer quadrant of left female breast: Secondary | ICD-10-CM | POA: Diagnosis not present

## 2017-05-22 DIAGNOSIS — R21 Rash and other nonspecific skin eruption: Secondary | ICD-10-CM | POA: Diagnosis not present

## 2017-05-22 DIAGNOSIS — Z5111 Encounter for antineoplastic chemotherapy: Secondary | ICD-10-CM | POA: Diagnosis not present

## 2017-05-22 LAB — CBC WITH DIFFERENTIAL/PLATELET
BASOS ABS: 0.1 10*3/uL (ref 0–0.1)
Basophils Relative: 1 %
EOS ABS: 0.4 10*3/uL (ref 0–0.7)
EOS PCT: 4 %
HCT: 34.1 % — ABNORMAL LOW (ref 35.0–47.0)
HEMOGLOBIN: 11.8 g/dL — AB (ref 12.0–16.0)
LYMPHS ABS: 2.9 10*3/uL (ref 1.0–3.6)
LYMPHS PCT: 30 %
MCH: 34.9 pg — AB (ref 26.0–34.0)
MCHC: 34.7 g/dL (ref 32.0–36.0)
MCV: 100.6 fL — ABNORMAL HIGH (ref 80.0–100.0)
Monocytes Absolute: 0.4 10*3/uL (ref 0.2–0.9)
Monocytes Relative: 4 %
NEUTROS PCT: 61 %
Neutro Abs: 5.9 10*3/uL (ref 1.4–6.5)
PLATELETS: 308 10*3/uL (ref 150–440)
RBC: 3.39 MIL/uL — AB (ref 3.80–5.20)
RDW: 16.3 % — ABNORMAL HIGH (ref 11.5–14.5)
WBC: 9.7 10*3/uL (ref 3.6–11.0)

## 2017-05-22 LAB — COMPREHENSIVE METABOLIC PANEL
ALT: 16 U/L (ref 14–54)
AST: 20 U/L (ref 15–41)
Albumin: 3.3 g/dL — ABNORMAL LOW (ref 3.5–5.0)
Alkaline Phosphatase: 80 U/L (ref 38–126)
Anion gap: 8 (ref 5–15)
BILIRUBIN TOTAL: 0.2 mg/dL — AB (ref 0.3–1.2)
BUN: 30 mg/dL — AB (ref 6–20)
CHLORIDE: 113 mmol/L — AB (ref 101–111)
CO2: 19 mmol/L — ABNORMAL LOW (ref 22–32)
CREATININE: 1.02 mg/dL — AB (ref 0.44–1.00)
Calcium: 9 mg/dL (ref 8.9–10.3)
Glucose, Bld: 172 mg/dL — ABNORMAL HIGH (ref 65–99)
POTASSIUM: 3.3 mmol/L — AB (ref 3.5–5.1)
Sodium: 140 mmol/L (ref 135–145)
TOTAL PROTEIN: 6.2 g/dL — AB (ref 6.5–8.1)

## 2017-05-22 MED ORDER — DIPHENHYDRAMINE HCL 50 MG/ML IJ SOLN
25.0000 mg | Freq: Once | INTRAMUSCULAR | Status: AC
  Administered 2017-05-22: 25 mg via INTRAVENOUS
  Filled 2017-05-22: qty 1

## 2017-05-22 MED ORDER — FAMOTIDINE IN NACL 20-0.9 MG/50ML-% IV SOLN
20.0000 mg | Freq: Once | INTRAVENOUS | Status: AC
  Administered 2017-05-22: 20 mg via INTRAVENOUS
  Filled 2017-05-22: qty 50

## 2017-05-22 MED ORDER — SODIUM CHLORIDE 0.9% FLUSH
10.0000 mL | INTRAVENOUS | Status: DC | PRN
  Administered 2017-05-22: 10 mL via INTRAVENOUS
  Filled 2017-05-22: qty 10

## 2017-05-22 MED ORDER — HEPARIN SOD (PORK) LOCK FLUSH 100 UNIT/ML IV SOLN
500.0000 [IU] | Freq: Once | INTRAVENOUS | Status: AC
  Administered 2017-05-22: 500 [IU] via INTRAVENOUS
  Filled 2017-05-22: qty 5

## 2017-05-22 MED ORDER — TRASTUZUMAB CHEMO 150 MG IV SOLR
2.0000 mg/kg | Freq: Once | INTRAVENOUS | Status: AC
  Administered 2017-05-22: 126 mg via INTRAVENOUS
  Filled 2017-05-22: qty 6

## 2017-05-22 MED ORDER — SODIUM CHLORIDE 0.9 % IV SOLN
Freq: Once | INTRAVENOUS | Status: AC
  Administered 2017-05-22: 10:00:00 via INTRAVENOUS
  Filled 2017-05-22: qty 1000

## 2017-05-22 MED ORDER — PACLITAXEL CHEMO INJECTION 300 MG/50ML
80.0000 mg/m2 | Freq: Once | INTRAVENOUS | Status: AC
  Administered 2017-05-22: 132 mg via INTRAVENOUS
  Filled 2017-05-22: qty 22

## 2017-05-22 MED ORDER — ACETAMINOPHEN 325 MG PO TABS
650.0000 mg | ORAL_TABLET | Freq: Once | ORAL | Status: AC
  Administered 2017-05-22: 650 mg via ORAL
  Filled 2017-05-22: qty 2

## 2017-05-22 NOTE — Progress Notes (Signed)
Pt in for follow up and treatment.  Scalp has improved greatly, no open areas or redness noted.  Pt reports some wheezing at night and congestion with clear sputum production.

## 2017-05-24 ENCOUNTER — Other Ambulatory Visit: Payer: Self-pay

## 2017-05-24 ENCOUNTER — Encounter: Payer: Self-pay | Admitting: Psychiatry

## 2017-05-24 ENCOUNTER — Ambulatory Visit (INDEPENDENT_AMBULATORY_CARE_PROVIDER_SITE_OTHER): Payer: Medicare Other | Admitting: Licensed Clinical Social Worker

## 2017-05-24 ENCOUNTER — Ambulatory Visit (INDEPENDENT_AMBULATORY_CARE_PROVIDER_SITE_OTHER): Payer: Medicare Other | Admitting: Psychiatry

## 2017-05-24 VITALS — BP 116/78 | HR 106 | Temp 98.2°F | Wt 143.4 lb

## 2017-05-24 DIAGNOSIS — F17200 Nicotine dependence, unspecified, uncomplicated: Secondary | ICD-10-CM | POA: Diagnosis not present

## 2017-05-24 DIAGNOSIS — G4700 Insomnia, unspecified: Secondary | ICD-10-CM

## 2017-05-24 DIAGNOSIS — F4322 Adjustment disorder with anxiety: Secondary | ICD-10-CM

## 2017-05-24 MED ORDER — DOXEPIN HCL 10 MG PO CAPS: 10.0000 mg | ORAL_CAPSULE | Freq: Every evening | ORAL | 1 refills | Status: DC | PRN
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
## 2017-05-29 ENCOUNTER — Inpatient Hospital Stay: Payer: Medicare Other

## 2017-05-29 VITALS — BP 96/66 | HR 94 | Temp 98.1°F | Resp 20 | Wt 142.1 lb

## 2017-05-29 DIAGNOSIS — Z5111 Encounter for antineoplastic chemotherapy: Secondary | ICD-10-CM | POA: Diagnosis not present

## 2017-05-29 DIAGNOSIS — C50919 Malignant neoplasm of unspecified site of unspecified female breast: Secondary | ICD-10-CM

## 2017-05-29 DIAGNOSIS — C50412 Malignant neoplasm of upper-outer quadrant of left female breast: Secondary | ICD-10-CM

## 2017-05-29 DIAGNOSIS — Z170 Estrogen receptor positive status [ER+]: Secondary | ICD-10-CM

## 2017-05-29 LAB — COMPREHENSIVE METABOLIC PANEL
ALBUMIN: 3.6 g/dL (ref 3.5–5.0)
ALK PHOS: 77 U/L (ref 38–126)
ALT: 17 U/L (ref 14–54)
AST: 20 U/L (ref 15–41)
Anion gap: 9 (ref 5–15)
BUN: 17 mg/dL (ref 6–20)
CALCIUM: 9.3 mg/dL (ref 8.9–10.3)
CO2: 21 mmol/L — AB (ref 22–32)
CREATININE: 0.93 mg/dL (ref 0.44–1.00)
Chloride: 107 mmol/L (ref 101–111)
GFR calc Af Amer: >60 mL/min (ref >60)
GFR calc non Af Amer: >60 mL/min (ref >60)
GLUCOSE: 159 mg/dL — AB (ref 65–99)
Potassium: 3.3 mmol/L — ABNORMAL LOW (ref 3.5–5.1)
SODIUM: 137 mmol/L (ref 135–145)
Total Bilirubin: 0.3 mg/dL (ref 0.3–1.2)
Total Protein: 6.5 g/dL (ref 6.5–8.1)

## 2017-05-29 LAB — CBC WITH DIFFERENTIAL/PLATELET
BASOS PCT: 1 %
Basophils Absolute: 0.1 10*3/uL (ref 0–0.1)
EOS ABS: 0.3 10*3/uL (ref 0–0.7)
Eosinophils Relative: 3 %
HEMATOCRIT: 34.4 % — AB (ref 35.0–47.0)
HEMOGLOBIN: 11.9 g/dL — AB (ref 12.0–16.0)
Lymphocytes Relative: 24 %
Lymphs Abs: 2.4 10*3/uL (ref 1.0–3.6)
MCH: 34.5 pg — ABNORMAL HIGH (ref 26.0–34.0)
MCHC: 34.5 g/dL (ref 32.0–36.0)
MCV: 99.9 fL (ref 80.0–100.0)
MONO ABS: 0.4 10*3/uL (ref 0.2–0.9)
MONOS PCT: 4 %
NEUTROS PCT: 68 %
Neutro Abs: 7 10*3/uL — ABNORMAL HIGH (ref 1.4–6.5)
Platelets: 300 10*3/uL (ref 150–440)
RBC: 3.45 MIL/uL — ABNORMAL LOW (ref 3.80–5.20)
RDW: 15.6 % — ABNORMAL HIGH (ref 11.5–14.5)
WBC: 10.1 10*3/uL (ref 3.6–11.0)

## 2017-05-29 MED ORDER — SODIUM CHLORIDE 0.9 % IV SOLN
80.0000 mg/m2 | Freq: Once | INTRAVENOUS | Status: AC
  Administered 2017-05-29: 132 mg via INTRAVENOUS
  Filled 2017-05-29: qty 22

## 2017-05-29 MED ORDER — SODIUM CHLORIDE 0.9 % IV SOLN
Freq: Once | INTRAVENOUS | Status: AC
  Administered 2017-05-29: 14:00:00 via INTRAVENOUS
  Filled 2017-05-29: qty 1000

## 2017-05-29 MED ORDER — ACETAMINOPHEN 325 MG PO TABS
650.0000 mg | ORAL_TABLET | Freq: Once | ORAL | Status: AC
  Administered 2017-05-29: 650 mg via ORAL
  Filled 2017-05-29: qty 2

## 2017-05-29 MED ORDER — HEPARIN SOD (PORK) LOCK FLUSH 100 UNIT/ML IV SOLN
500.0000 [IU] | Freq: Once | INTRAVENOUS | Status: AC | PRN
  Administered 2017-05-29: 500 [IU]
  Filled 2017-05-29: qty 5

## 2017-05-29 MED ORDER — FAMOTIDINE IN NACL 20-0.9 MG/50ML-% IV SOLN
20.0000 mg | Freq: Once | INTRAVENOUS | Status: AC
  Administered 2017-05-29: 20 mg via INTRAVENOUS
  Filled 2017-05-29: qty 50

## 2017-05-29 MED ORDER — DIPHENHYDRAMINE HCL 50 MG/ML IJ SOLN
25.0000 mg | Freq: Once | INTRAMUSCULAR | Status: AC
Start: 2017-05-29 — End: 2017-05-29
  Administered 2017-05-29: 25 mg via INTRAVENOUS
  Filled 2017-05-29: qty 1

## 2017-05-29 MED ORDER — SODIUM CHLORIDE 0.9 % IV SOLN
2.0000 mg/kg | Freq: Once | INTRAVENOUS | Status: AC
  Administered 2017-05-29: 126 mg via INTRAVENOUS
  Filled 2017-05-29: qty 6

## 2017-05-31 ENCOUNTER — Encounter: Payer: Self-pay | Admitting: Radiation Oncology

## 2017-05-31 ENCOUNTER — Other Ambulatory Visit: Payer: Self-pay

## 2017-05-31 ENCOUNTER — Ambulatory Visit
Admission: RE | Admit: 2017-05-31 | Discharge: 2017-05-31 | Disposition: A | Discharge status: Home / Self Care | Payer: Medicare Other | Source: Ambulatory Visit | Attending: Radiation Oncology | Admitting: Radiation Oncology

## 2017-05-31 VITALS — BP 124/77 | HR 109 | Temp 97.1°F | Resp 20 | Wt 144.1 lb

## 2017-05-31 DIAGNOSIS — Z510 Encounter for antineoplastic radiation therapy: Secondary | ICD-10-CM | POA: Insufficient documentation

## 2017-05-31 DIAGNOSIS — C50412 Malignant neoplasm of upper-outer quadrant of left female breast: Secondary | ICD-10-CM | POA: Diagnosis present

## 2017-05-31 DIAGNOSIS — Z801 Family history of malignant neoplasm of trachea, bronchus and lung: Secondary | ICD-10-CM | POA: Diagnosis not present

## 2017-05-31 DIAGNOSIS — F419 Anxiety disorder, unspecified: Secondary | ICD-10-CM | POA: Insufficient documentation

## 2017-05-31 DIAGNOSIS — N183 Chronic kidney disease, stage 3 (moderate): Secondary | ICD-10-CM | POA: Insufficient documentation

## 2017-05-31 DIAGNOSIS — E0590 Thyrotoxicosis, unspecified without thyrotoxic crisis or storm: Secondary | ICD-10-CM | POA: Diagnosis not present

## 2017-05-31 DIAGNOSIS — E785 Hyperlipidemia, unspecified: Secondary | ICD-10-CM | POA: Diagnosis not present

## 2017-05-31 DIAGNOSIS — M129 Arthropathy, unspecified: Secondary | ICD-10-CM | POA: Diagnosis not present

## 2017-05-31 DIAGNOSIS — K219 Gastro-esophageal reflux disease without esophagitis: Secondary | ICD-10-CM | POA: Insufficient documentation

## 2017-05-31 DIAGNOSIS — K589 Irritable bowel syndrome without diarrhea: Secondary | ICD-10-CM | POA: Insufficient documentation

## 2017-05-31 DIAGNOSIS — J45909 Unspecified asthma, uncomplicated: Secondary | ICD-10-CM | POA: Diagnosis not present

## 2017-05-31 DIAGNOSIS — Z800 Family history of malignant neoplasm of digestive organs: Secondary | ICD-10-CM | POA: Diagnosis not present

## 2017-05-31 DIAGNOSIS — G4730 Sleep apnea, unspecified: Secondary | ICD-10-CM | POA: Diagnosis not present

## 2017-05-31 DIAGNOSIS — M5136 Other intervertebral disc degeneration, lumbar region: Secondary | ICD-10-CM | POA: Insufficient documentation

## 2017-05-31 DIAGNOSIS — Z7982 Long term (current) use of aspirin: Secondary | ICD-10-CM | POA: Insufficient documentation

## 2017-05-31 DIAGNOSIS — G2581 Restless legs syndrome: Secondary | ICD-10-CM | POA: Insufficient documentation

## 2017-05-31 DIAGNOSIS — J449 Chronic obstructive pulmonary disease, unspecified: Secondary | ICD-10-CM | POA: Insufficient documentation

## 2017-05-31 DIAGNOSIS — M797 Fibromyalgia: Secondary | ICD-10-CM | POA: Insufficient documentation

## 2017-05-31 DIAGNOSIS — Z803 Family history of malignant neoplasm of breast: Secondary | ICD-10-CM | POA: Diagnosis not present

## 2017-05-31 DIAGNOSIS — F17210 Nicotine dependence, cigarettes, uncomplicated: Secondary | ICD-10-CM | POA: Insufficient documentation

## 2017-05-31 DIAGNOSIS — Z170 Estrogen receptor positive status [ER+]: Secondary | ICD-10-CM | POA: Diagnosis not present

## 2017-05-31 NOTE — Consult Note (Signed)
NEW PATIENT EVALUATION  Name: Briana [name]  MRN: [id number]  Date:   05/31/2017     DOB: 12-02-1965   This 52 y.o. 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  REFERRING PHYSICIAN: [name], MD  CHIEF COMPLAINT:  Chief Complaint  Patient presents with  . Breast Cancer    Pt is here for initial consultation of breast cancer    DIAGNOSIS: The encounter diagnosis was Malignant neoplasm of upper-outer quadrant of left breast in female, estrogen receptor positive ([place]).   PREVIOUS INVESTIGATIONS:  Mammograms and ultrasound reviewed Pathology reports reviewed Clinical notes reviewed  HPI: patient is a 52 year old female initially presented withabnormal mammogram showing a mass in the upper outer quadrant of the left breast . This was confirmed on ultrasound to be a 1.5 cm lesion at 1:00 position of the left breast 5-6 cm from the nipple. Ultrasound biopsy was performed showing triple positive invasive mammary carcinoma.patient underwent a wide local excision of left breast showing a 1.1 cm invasive mammary carcinoma margins clear at 2 mm. Tumor was overall grade 2 again ER/PR positive HER-2/neu overexpressed.one supposedly sentinel lymph node was excised although this was benign fibroadipose tissue not consistent with lymph node origin.patient went on to have Taxol and as well as Herceptin. She's completed Taxol therapy continues on Herceptin maintenance. She is otherwise doing well she specifically denies breast tenderness cough or bone pain. She seen today for radiation oncology opinion.  PLANNED TREATMENT REGIMEN: eft whole breast and peripheral lymphatic radiation  PAST MEDICAL HISTORY:  has a past medical history of Abnormal Pap smear of cervix, Allergy, Anxiety, Arthritis, Asthma, Chronic  kidney disease, COPD (chronic obstructive pulmonary disease) ([place]), DDD (degenerative disc disease), lumbar, Depression, Elevated lipids, Fibromyalgia, Fibromyalgia, Genetic testing (03/09/2017), GERD (gastroesophageal reflux disease), History of IBS, Hyperthyroidism, Joint disease, Migraine, Migraine, Oxygen deficiency, Restless leg syndrome, and Sleep apnea.    PAST SURGICAL HISTORY:  Past Surgical History:  Procedure Laterality Date  . ABDOMINAL HYSTERECTOMY  2008  . BREAST BIOPSY Left 02/02/2017   [place] core path pending  . BREAST EXCISIONAL BIOPSY Left 02/17/2017   lumpectomy with nl sn  . BREAST LUMPECTOMY WITH NEEDLE LOCALIZATION Left 02/17/2017   Procedure: BREAST LUMPECTOMY WITH NEEDLE LOCALIZATION;  Surgeon: Vickie Epley, MD;  Location: [facility];  Service: General;  Laterality: Left;  . CARPAL TUNNEL RELEASE Bilateral   . cryotherapy    . DILATION AND CURETTAGE OF UTERUS    . ENDOMETRIAL ABLATION    . LAPAROSCOPIC OOPHERECTOMY Left    unsure which side but thinks its the left  . LYSIS OF ADHESION Left 10/14/2015   Procedure: LYSIS OF ADHESION;  Surgeon: Thornton Park, MD;  Location: [facility];  Service: Orthopedics;  Laterality: Left;  . NECK SURGERY     lower neck fusion rods and screws  . PORTA CATH INSERTION N/A 03/08/2017   Procedure: PORTA CATH INSERTION;  Surgeon: Katha Cabal, MD;  Location: [facility];  Service: Cardiovascular;  Laterality: N/A;  . RESECTION DISTAL CLAVICAL Left 10/14/2015   Procedure: RESECTION DISTAL CLAVICAL;  Surgeon: Thornton Park, MD;  Location: [facility];  Service: Orthopedics;  Laterality: Left;  . SENTINEL NODE BIOPSY Left 02/17/2017   Procedure: SENTINEL NODE BIOPSY;  Surgeon: Vickie Epley, MD;  Location: [facility];  Service: General;  Laterality: Left;  . SHOULDER ARTHROSCOPY WITH OPEN ROTATOR CUFF REPAIR Left 10/14/2015   Procedure: SHOULDER ARTHROSCOPY WITH OPEN ROTATOR CUFF REPAIR;  Surgeon: Thornton Park, MD;  Location:  [facility];  Service: Orthopedics;  Laterality: Left;  . SHOULDER ARTHROSCOPY WITH OPEN ROTATOR CUFF REPAIR AND DISTAL CLAVICLE ACROMINECTOMY Right 09/03/2014   Procedure: RIGHT SHOULDER ARTHROSCOPY WITH MINI OPEN ROTATOR CUFF TEAR;  Surgeon: Thornton Park, MD;  Location: [facility];  Service: Orthopedics;  Laterality: Right;  biceps tenodesis, arthroscopic subacromial decompression and distal clavicle incision  . spinal injections    . SUBACROMIAL DECOMPRESSION Left 10/14/2015   Procedure: SUBACROMIAL DECOMPRESSION;  Surgeon: Thornton Park, MD;  Location: [facility];  Service: Orthopedics;  Laterality: Left;    FAMILY HISTORY: family history includes Bipolar disorder in her sister; Breast cancer in her cousin; Breast cancer (age of onset: 50) in her mother; Cancer in her maternal aunt and other; Colon cancer in her maternal aunt, maternal grandfather, and other; Diabetes in her father; Lung cancer in her father, maternal grandmother, and paternal grandmother; Schizophrenia in her sister.  SOCIAL HISTORY:  reports that she has been smoking cigarettes.  She has been smoking about 1.00 pack per day. She has never used smokeless tobacco. She reports that she does not drink alcohol or use drugs.  ALLERGIES: Iodine; Bee venom; Cefoxitin; Cefuroxime axetil; Cucumber extract; Gadolinium derivatives; Iodinated diagnostic agents; Latex; Other; and Tomato  MEDICATIONS:  Current Outpatient Medications  Medication Sig Dispense Refill  . albuterol (ACCUNEB) 0.63 MG/3ML nebulizer solution Take 1 ampule by nebulization every 6 (six) hours as needed for wheezing.    Marland Kitchen albuterol (PROAIR HFA) 108 (90 Base) MCG/ACT inhaler ProAir HFA 90 mcg/actuation aerosol inhaler    . baclofen (LIORESAL) 10 MG tablet Take 10 mg by mouth 3 (three) times daily as needed for muscle spasms (typically 2-3 times daily).     . cholecalciferol (VITAMIN D) 1000 UNITS tablet Take 1,000 Units by mouth 2 (two) times daily.     . diclofenac  sodium (VOLTAREN) 1 % GEL Apply 2 g topically 4 (four) times daily as needed (for pain.).     Marland Kitchen doxepin (SINEQUAN) 10 MG capsule Take 1-2 capsules (10-20 mg total) by mouth at bedtime as needed. 180 capsule 1  . EPINEPHrine (EPIPEN JR) 0.15 MG/0.3ML injection Inject 0.15 mg into the muscle as needed for anaphylaxis.    . hydrocortisone 2.5 % cream Apply 1 application topically 2 (two) times daily as needed (for itchy/dry skin.).    Marland Kitchen lidocaine-prilocaine (EMLA) cream Apply to affected area once 30 g 3  . linaclotide (LINZESS) 145 MCG CAPS capsule Take 145 mcg by mouth daily as needed (for constipation.).    Marland Kitchen magnesium oxide (MAG-OX) 400 MG tablet Take 400 mg by mouth daily. Reported on 07/08/2015    . Multiple Vitamins-Minerals (HAIR SKIN AND NAILS FORMULA) TABS Take 2 tablets by mouth daily.     Marland Kitchen nystatin cream (MYCOSTATIN)     . NYSTATIN powder  APPLY TOPICALLY FOUR TIMES DAILY 15 g 1  . omeprazole (PRILOSEC) 20 MG capsule Take 20 mg by mouth daily before breakfast.     . ondansetron (ZOFRAN) 8 MG tablet Take 1 tablet (8 mg total) by mouth 2 (two) times daily as needed (Nausea or vomiting). 30 tablet 2  . oxyCODONE 10 MG TABS Take 1 tablet (10 mg total) by mouth every 4 (four) hours as needed for breakthrough pain. 60 tablet 0  . prochlorperazine (COMPAZINE) 10 MG tablet Take  1 tablet (10 mg total) by mouth every 6 (six) hours as needed (Nausea or vomiting). 60 tablet 2  . promethazine (PHENERGAN) 25 MG tablet Take 25 mg by mouth every 6 (six) hours as needed for nausea or vomiting.    . propranolol (INDERAL) 10 MG tablet Take 1 tablet (10 mg total) by mouth 2 (two) times daily as needed. For severe anxiety sx 60 tablet 1  . rizatriptan (MAXALT-MLT) 10 MG disintegrating tablet Take 10 mg by mouth as needed for migraine. May repeat in 2 hours if needed    . rosuvastatin (CRESTOR) 40 MG tablet Take 40 mg by mouth at bedtime.     . topiramate (TOPAMAX) 100 MG tablet Take 200 mg by mouth daily.      Marland Kitchen triamcinolone ointment (KENALOG) 0.5 % Apply 1 application topically 2 (two) times daily. 30 g 0  . vitamin E (VITAMIN E) 200 UNIT capsule Take 200 Units by mouth 4 (four) times a week. Take 4-5 times a week    . doxycycline (VIBRAMYCIN) 100 MG capsule Take 1 capsule (100 mg total) by mouth 2 (two) times daily. (Patient not taking: Reported on 05/31/2017) 14 capsule 0   No current facility-administered medications for this encounter.     ECOG PERFORMANCE STATUS:  0 - Asymptomatic  REVIEW OF SYSTEMS:  Patient denies any weight loss, fatigue, weakness, fever, chills or night sweats. Patient denies any loss of vision, blurred vision. Patient denies any ringing  of the ears or hearing loss. No irregular heartbeat. Patient denies heart murmur or history of fainting. Patient denies any chest pain or pain radiating to her upper extremities. Patient denies any shortness of breath, difficulty breathing at night, cough or hemoptysis. Patient denies any swelling in the lower legs. Patient denies any nausea vomiting, vomiting of blood, or coffee ground material in the vomitus. Patient denies any stomach pain. Patient states has had normal bowel movements no significant constipation or diarrhea. Patient denies any dysuria, hematuria or significant nocturia. Patient denies any problems walking, swelling in the joints or loss of balance. Patient denies any skin changes, loss of hair or loss of weight. Patient denies any excessive worrying or anxiety or significant depression. Patient denies any problems with insomnia. Patient denies excessive thirst, polyuria, polydipsia. Patient denies any swollen glands, patient denies easy bruising or easy bleeding. Patient denies any recent infections, allergies or URI. Patient "s visual fields have not changed significantly in recent time.    PHYSICAL EXAM: BP 124/77   Pulse (!) 109   Temp (!) 97.1 F (36.2 C)   Resp 20   Wt 144 lb 1.1 oz (65.4 kg)   LMP 08/25/2006    BMI 27.22 kg/m  Patient is wide local excision of left breast which is well-healed no dominant mass or nodularity is noted in either breast in 2 positions examined. No axillary or supraclavicular adenopathy is appreciated. Well-developed well-nourished patient in NAD. HEENT reveals PERLA, EOMI, discs not visualized.  Oral cavity is clear. No oral mucosal lesions are identified. Neck is clear without evidence of cervical or supraclavicular adenopathy. Lungs are clear to A&P. Cardiac examination is essentially unremarkable with regular rate and rhythm without murmur rub or thrill. Abdomen is benign with no organomegaly or masses noted. Motor sensory and DTR levels are equal and symmetric in the upper and lower extremities. Cranial nerves II through XII are grossly intact. Proprioception is intact. No peripheral adenopathy or edema is identified. No motor or sensory levels are noted. Crude  visual fields are within normal range.  LABORATORY DATA: pathology reports reviewed    RADIOLOGY RESULTS:mammograms ultrasound reviewed   IMPRESSION: resume stage I in triple positive invasive mammary carcinoma the left breast status post wide local excision and adjuvant chemotherapy with Herceptin no sentinel lymph nodeseen.  PLAN: at this time I recommend left whole breast and peripheral lymphatic radiation. Based on the fact her lymph nodes were never sampled either by axillary dissection or sentinel node biopsy we must assume with triple positive disease there is risk for microscopic disease in the axilla. Would plan on delivering 5040 cGy over 28 fractions to her left breast and peripheral lymphatics boosting her scar another 1400 cGy using electron beam. Risks and benefits of treatment include a skin reaction fatigue alteration of blood counts possible inclusion of superficial lung all were discussed in detail with the patient also slight possibility of lymphedema of her left upper extremity was discussed. I  personally set up and ordered CT simulation on the patient. Patient and husband both seem to comprehend my treatment plan well. Patient will also benefit from anti-estrogen therapy after completion of radiation.  I would like to take this opportunity to thank you for allowing me to participate in the care of your patient.Noreene Filbert, MD

## 2017-06-13 ENCOUNTER — Ambulatory Visit
Admission: RE | Admit: 2017-06-13 | Discharge: 2017-06-13 | Disposition: A | Discharge status: Home / Self Care | Payer: Medicare Other | Source: Ambulatory Visit | Attending: Oncology | Admitting: Oncology

## 2017-06-13 ENCOUNTER — Ambulatory Visit
Admission: RE | Admit: 2017-06-13 | Discharge: 2017-06-13 | Disposition: A | Discharge status: Home / Self Care | Payer: Medicare Other | Source: Ambulatory Visit | Attending: Radiation Oncology | Admitting: Radiation Oncology

## 2017-06-13 DIAGNOSIS — Z170 Estrogen receptor positive status [ER+]: Secondary | ICD-10-CM | POA: Insufficient documentation

## 2017-06-13 DIAGNOSIS — C50412 Malignant neoplasm of upper-outer quadrant of left female breast: Secondary | ICD-10-CM

## 2017-06-13 DIAGNOSIS — Z510 Encounter for antineoplastic radiation therapy: Secondary | ICD-10-CM | POA: Diagnosis not present

## 2017-06-13 MED ORDER — TECHNETIUM TC 99M-LABELED RED BLOOD CELLS IV KIT
20.0000 | PACK | Freq: Once | INTRAVENOUS | Status: AC | PRN
  Administered 2017-06-13: 20.94 via INTRAVENOUS

## 2017-06-15 DIAGNOSIS — Z510 Encounter for antineoplastic radiation therapy: Secondary | ICD-10-CM | POA: Diagnosis not present

## 2017-06-17 ENCOUNTER — Other Ambulatory Visit: Payer: Self-pay | Admitting: Oncology

## 2017-06-18 NOTE — Progress Notes (Signed)
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  INTERVAL HISTORY: Patient returns to clinic today for further evaluation and consideration of cycle 5 of maintenance Herceptin.  She is noticing increased weakness and fatigue and is asking about returning to the gym but otherwise feels well.  She also states the rash on her head has returned and she is actively being treated by dermatology.  She otherwise feels well. She has no neurologic complaints.  She denies any recent fevers or illnesses.  She has a good appetite and denies weight loss.  She has no chest pain, cough, hemoptysis, or shortness of breath. She denies any nausea, vomiting, constipation, or diarrhea.  She has no urinary complaints.  Patient offers no further specific complaints today.  REVIEW OF SYSTEMS:   Review of Systems  Constitutional: Negative.  Negative for fever, malaise/fatigue and weight loss.  Respiratory: Negative.  Negative for cough and shortness of breath.   Cardiovascular: Negative.  Negative for chest pain and leg swelling.  Gastrointestinal: Negative.  Negative for abdominal pain.  Genitourinary: Negative.  Negative for dysuria.  Musculoskeletal: Negative.  Negative for joint pain.  Skin: Positive for rash. Negative for itching.  Neurological: Negative.  Negative for sensory change, focal weakness and weakness.  Psychiatric/Behavioral: Negative.  The patient is not nervous/anxious and does not have insomnia.     As per HPI. Otherwise, a complete review of systems is negative.  PAST MEDICAL HISTORY: Past Medical History:  Diagnosis Date  . Abnormal Pap smear of cervix    01/2015 ascus/neg- 04/2015  ascus/neg  . Allergy   . Anxiety   . Arthritis   . Asthma   . Chronic kidney disease   . COPD (chronic obstructive pulmonary disease) ([place])   . DDD (degenerative disc disease), lumbar   . Depression   . Elevated lipids   . Fibromyalgia   . Fibromyalgia   . Genetic testing 03/09/2017   Multi-Cancer panel (83 genes) @ [facility] - No pathogenic mutations detected  . GERD (gastroesophageal reflux disease)   . History of IBS   . Hyperthyroidism   . Joint disease   . Migraine   . Migraine   . Oxygen deficiency   . Restless leg syndrome   . Sleep apnea    Does not use C-PAP, cannot tolerate mask    PAST SURGICAL HISTORY: Past Surgical History:  Procedure Laterality Date  . ABDOMINAL HYSTERECTOMY  2008  . BREAST BIOPSY Left 02/02/2017   [place] core path pending  . BREAST EXCISIONAL BIOPSY Left 02/17/2017   lumpectomy with nl sn  . BREAST LUMPECTOMY WITH NEEDLE LOCALIZATION Left 02/17/2017   Procedure: BREAST LUMPECTOMY WITH NEEDLE LOCALIZATION;  Surgeon: Vickie Epley, MD;  Location: [facility];  Service: General;  Laterality: Left;  . CARPAL TUNNEL RELEASE Bilateral   . cryotherapy    . DILATION AND CURETTAGE OF UTERUS    . ENDOMETRIAL ABLATION    . LAPAROSCOPIC OOPHERECTOMY Left    unsure which side but thinks its the left  . LYSIS OF ADHESION Left 10/14/2015   Procedure: LYSIS OF ADHESION;  Surgeon: Thornton Park, MD;  Location: [facility];  Service: Orthopedics;  Laterality: Left;  . NECK SURGERY  lower neck fusion rods and screws  . PORTA CATH INSERTION N/A 03/08/2017   Procedure: PORTA CATH INSERTION;  Surgeon: Katha Cabal, MD;  Location: [place] [facility];  Service: Cardiovascular;  Laterality: N/A;  . RESECTION DISTAL CLAVICAL Left 10/14/2015   Procedure: RESECTION DISTAL CLAVICAL;  Surgeon: Thornton Park, MD;  Location: [facility];  Service: Orthopedics;  Laterality: Left;  . SENTINEL NODE BIOPSY Left 02/17/2017   Procedure: SENTINEL NODE BIOPSY;  Surgeon:  Vickie Epley, MD;  Location: [facility];  Service: General;  Laterality: Left;  . SHOULDER ARTHROSCOPY WITH OPEN ROTATOR CUFF REPAIR Left 10/14/2015   Procedure: SHOULDER ARTHROSCOPY WITH OPEN ROTATOR CUFF REPAIR;  Surgeon: Thornton Park, MD;  Location: [facility];  Service: Orthopedics;  Laterality: Left;  . SHOULDER ARTHROSCOPY WITH OPEN ROTATOR CUFF REPAIR AND DISTAL CLAVICLE ACROMINECTOMY Right 09/03/2014   Procedure: RIGHT SHOULDER ARTHROSCOPY WITH MINI OPEN ROTATOR CUFF TEAR;  Surgeon: Thornton Park, MD;  Location: [facility];  Service: Orthopedics;  Laterality: Right;  biceps tenodesis, arthroscopic subacromial decompression and distal clavicle incision  . spinal injections    . SUBACROMIAL DECOMPRESSION Left 10/14/2015   Procedure: SUBACROMIAL DECOMPRESSION;  Surgeon: Thornton Park, MD;  Location: [facility];  Service: Orthopedics;  Laterality: Left;    FAMILY HISTORY: Family History  Problem Relation Age of Onset  . Breast cancer Mother 71       Glioblastoma also; deceased at 52  . Diabetes Father   . Lung cancer Father        mesothelioma; deceased 12s  . Cancer Maternal Aunt        "bone ca"; unk. primary  . Colon cancer Maternal Grandfather        dx in 35s; deceased 56  . Colon cancer Maternal Aunt        dx in 20s; currently 20s  . Lung cancer Maternal Grandmother        smoker; deceased 27  . Lung cancer Paternal Grandmother        smoker; deceased 14s  . Breast cancer Cousin        dx 72s; daughter of mat aunt with unk. primary cancer  . Cancer Other        distant cousin; unknown primary  . Colon cancer Other        dx 67s; currently 66; maternal half-sister  . Bipolar disorder Sister   . Schizophrenia Sister     ADVANCED DIRECTIVES (Y/N):  N  HEALTH MAINTENANCE: Social History   Tobacco Use  . Smoking status: Current Every Day Smoker    Packs/day: 1.00    Types: Cigarettes  . Smokeless tobacco: Never Used  Substance Use Topics  . Alcohol use: No     Alcohol/week: 0.0 oz  . Drug use: No     Colonoscopy:  PAP:  Bone density:  Lipid panel:  Allergies  Allergen Reactions  . Iodine   . Bee Venom Hives and Rash  . Cefoxitin Rash  . Cefuroxime Axetil Hives and Rash  . Cucumber Extract Rash  . Gadolinium Derivatives Swelling, Other (See Comments) and Rash    NECK BECAME RED AND TONGUE WAS SWELLING SLIGHTLY PER PT NECK BECAME RED AND TONGUE WAS SWELLING SLIGHTLY PER PT NECK BECAME RED AND TONGUE WAS SWELLING SLIGHTLY PER PT  . Iodinated Diagnostic Agents Hives and Rash  . Latex Rash  . Other Hives and Rash    Bee Stings-swelling/hives/rash Wool (textile fiber)-Rash/itching  . Tomato Rash    Red tomatoes  Current Outpatient Medications  Medication Sig Dispense Refill  . baclofen (LIORESAL) 10 MG tablet Take 10 mg by mouth 3 (three) times daily as needed for muscle spasms (typically 2-3 times daily).     . cephALEXin (KEFLEX) 500 MG capsule     . cholecalciferol (VITAMIN D) 1000 UNITS tablet Take 1,000 Units by mouth 2 (two) times daily.     . clobetasol (OLUX) 0.05 % topical foam     . diclofenac sodium (VOLTAREN) 1 % GEL Apply 2 g topically 4 (four) times daily as needed (for pain.).     Marland Kitchen doxepin (SINEQUAN) 10 MG capsule Take 1-2 capsules (10-20 mg total) by mouth at bedtime as needed. 180 capsule 1  . hydrocortisone 2.5 % cream Apply 1 application topically 2 (two) times daily as needed (for itchy/dry skin.).    Marland Kitchen linaclotide (LINZESS) 145 MCG CAPS capsule Take 145 mcg by mouth daily as needed (for constipation.).    Marland Kitchen magnesium oxide (MAG-OX) 400 MG tablet Take 400 mg by mouth daily. Reported on 07/08/2015    . Multiple Vitamins-Minerals (HAIR SKIN AND NAILS FORMULA) TABS Take 2 tablets by mouth daily.     Marland Kitchen nystatin cream (MYCOSTATIN)     . NYSTATIN powder  APPLY TOPICALLY FOUR TIMES DAILY 15 g 1  . omeprazole (PRILOSEC) 20 MG capsule Take 20 mg by mouth daily before breakfast.     . oxyCODONE 10 MG TABS Take 1 tablet (10  mg total) by mouth every 4 (four) hours as needed for breakthrough pain. 60 tablet 0  . rosuvastatin (CRESTOR) 40 MG tablet Take 40 mg by mouth at bedtime.     . topiramate (TOPAMAX) 100 MG tablet Take 200 mg by mouth daily.     Marland Kitchen triamcinolone ointment (KENALOG) 0.5 % Apply 1 application topically 2 (two) times daily. 30 g 0  . vitamin E (VITAMIN E) 200 UNIT capsule Take 200 Units by mouth 4 (four) times a week. Take 4-5 times a week    . albuterol (ACCUNEB) 0.63 MG/3ML nebulizer solution Take 1 ampule by nebulization every 6 (six) hours as needed for wheezing.    Marland Kitchen albuterol (PROAIR HFA) 108 (90 Base) MCG/ACT inhaler ProAir HFA 90 mcg/actuation aerosol inhaler    . EPINEPHrine (EPIPEN JR) 0.15 MG/0.3ML injection Inject 0.15 mg into the muscle as needed for anaphylaxis.    Marland Kitchen lidocaine-prilocaine (EMLA) cream Apply to affected area once (Patient not taking: Reported on 06/19/2017) 30 g 3  . ondansetron (ZOFRAN) 8 MG tablet Take 1 tablet (8 mg total) by mouth 2 (two) times daily as needed (Nausea or vomiting). (Patient not taking: Reported on 06/19/2017) 30 tablet 2  . prochlorperazine (COMPAZINE) 10 MG tablet Take 1 tablet (10 mg total) by mouth every 6 (six) hours as needed (Nausea or vomiting). (Patient not taking: Reported on 06/19/2017) 60 tablet 2  . promethazine (PHENERGAN) 25 MG tablet Take 25 mg by mouth every 6 (six) hours as needed for nausea or vomiting.    . propranolol (INDERAL) 10 MG tablet Take 1 tablet (10 mg total) by mouth 2 (two) times daily as needed. For severe anxiety sx (Patient not taking: Reported on 06/19/2017) 60 tablet 1  . rizatriptan (MAXALT-MLT) 10 MG disintegrating tablet Take 10 mg by mouth as needed for migraine. May repeat in 2 hours if needed     No current facility-administered medications for this visit.     OBJECTIVE: Vitals:   06/19/17 0950  BP: 96/78  Pulse: (!) 104  Resp: 20  Temp: 98.3 F (36.8 C)     Body mass index is 26.66 kg/m.    ECOG FS:0 -  Asymptomatic  General: Well-developed, well-nourished, no acute distress. Eyes: Pink conjunctiva, anicteric sclera. HEENT: Normocephalic, moist mucous membranes, clear oropharnyx. Lungs: Clear to auscultation bilaterally. Heart: Regular rate and rhythm. No rubs, murmurs, or gallops. Abdomen: Soft, nontender, nondistended. No organomegaly noted, normoactive bowel sounds. Musculoskeletal: No edema, cyanosis, or clubbing. Neuro: Alert, answering all questions appropriately. Cranial nerves grossly intact. Skin: Resolving folliculitis appearing rash on scalp.   Psych: Normal affect.  LAB RESULTS:  Lab Results  Component Value Date   NA 137 06/19/2017   K 3.4 (L) 06/19/2017   CL 108 06/19/2017   CO2 19 (L) 06/19/2017   GLUCOSE 178 (H) 06/19/2017   BUN 24 (H) 06/19/2017   CREATININE 1.05 (H) 06/19/2017   CALCIUM 9.2 06/19/2017   PROT 7.1 06/19/2017   ALBUMIN 3.7 06/19/2017   AST 30 06/19/2017   ALT 17 06/19/2017   ALKPHOS 83 06/19/2017   BILITOT 0.3 06/19/2017   GFRNONAA 60 (L) 06/19/2017   GFRAA >60 06/19/2017    Lab Results  Component Value Date   WBC 18.8 (H) 06/19/2017   NEUTROABS 15.6 (H) 06/19/2017   HGB 12.7 06/19/2017   HCT 37.2 06/19/2017   MCV 100.7 (H) 06/19/2017   PLT 299 06/19/2017     STUDIES: Nm Cardiac Muga Rest  Result Date: 06/13/2017 CLINICAL DATA:  Breast cancer, cardiotoxic chemotherapy EXAM: NUCLEAR MEDICINE CARDIAC BLOOD POOL IMAGING (MUGA) TECHNIQUE: Cardiac multi-gated acquisition was performed at rest following intravenous injection of Tc-22mlabeled red blood cells. RADIOPHARMACEUTICALS:  20.94 mCi Tc-96mertechnetate in-vitro labeled red blood cells IV COMPARISON:  03/07/2017 FINDINGS: Calculated LEFT ventricular ejection fraction is 53.2% unchanged from 53.8% on prior exam. Study performed at a cardiac rate of 89 beats per minute. Patient was rhythmic during imaging. Normal LEFT ventricular wall motion. IMPRESSION: Normal LEFT ventricular  ejection fraction of 53.2%, unchanged. Normal LV wall motion. Electronically Signed   By: MaLavonia Dana.D.   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  PLAN:    1. 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  MUGA scan on Jun 13, 2017 revealed an EF of 53.2% which is unchanged from previous.  Proceed with cycle 5 of maintenance Herceptin today.  Continue daily XRT. Because patient has an ER/PR positive malignancy, she will benefit from an aromatase inhibitor for 5 years at the conclusion of all her treatments.  Return to clinic in 3 weeks for further evaluation and consideration of cycle 6 of maintenance Herceptin.   2.  Genetic testing: Negative.  No further interventions are needed. 3.  Joint pain: Patient does not complain of this today.  Continue symptomatic treatment as directed. 4.  Anxiety: Continue evaluation and treatment per psychiatry. 5.  Chronic pain: Chronic.  Continue appointments with pain clinic as scheduled. 6.  Difficulty sleeping:  Continue Ambien as needed. 7.  Rash: Treatment per dermatology. 8.  Weakness and fatigue: Patient was given a referral to the CARE program.    Approximately 30 minutes was spent in discussion of which greater than 50% was consultation.  Patient expressed understanding and was in agreement with this plan. She also understands that She can call  clinic at any time with any questions, concerns, or complaints.   Cancer Staging Malignant neoplasm of upper-outer quadrant of left breast in female, estrogen receptor positive ([place]) Staging form: Breast, AJCC 8th Edition - Clinical stage from 02/07/2017: Stage IA (cT1c, cN0, cM0, G1, ER: Positive, PR: Positive, HER2: Positive) - Signed by Lloyd Huger, MD on  02/11/2017   Lloyd Huger, MD   06/20/2017 1:51 PM

## 2017-06-19 ENCOUNTER — Other Ambulatory Visit: Payer: Self-pay | Admitting: *Deleted

## 2017-06-19 ENCOUNTER — Inpatient Hospital Stay: Payer: Medicare Other

## 2017-06-19 ENCOUNTER — Other Ambulatory Visit: Payer: Self-pay

## 2017-06-19 ENCOUNTER — Inpatient Hospital Stay: Payer: Medicare Other | Attending: Oncology

## 2017-06-19 ENCOUNTER — Inpatient Hospital Stay (HOSPITAL_BASED_OUTPATIENT_CLINIC_OR_DEPARTMENT_OTHER): Payer: Medicare Other | Admitting: Oncology

## 2017-06-19 VITALS — BP 96/78 | HR 104 | Temp 98.3°F | Resp 20 | Wt 141.1 lb

## 2017-06-19 DIAGNOSIS — Z170 Estrogen receptor positive status [ER+]: Secondary | ICD-10-CM

## 2017-06-19 DIAGNOSIS — C50412 Malignant neoplasm of upper-outer quadrant of left female breast: Secondary | ICD-10-CM | POA: Insufficient documentation

## 2017-06-19 DIAGNOSIS — Z800 Family history of malignant neoplasm of digestive organs: Secondary | ICD-10-CM | POA: Diagnosis not present

## 2017-06-19 DIAGNOSIS — G4730 Sleep apnea, unspecified: Secondary | ICD-10-CM

## 2017-06-19 DIAGNOSIS — F17210 Nicotine dependence, cigarettes, uncomplicated: Secondary | ICD-10-CM | POA: Insufficient documentation

## 2017-06-19 DIAGNOSIS — F419 Anxiety disorder, unspecified: Secondary | ICD-10-CM

## 2017-06-19 DIAGNOSIS — Z5111 Encounter for antineoplastic chemotherapy: Secondary | ICD-10-CM | POA: Insufficient documentation

## 2017-06-19 DIAGNOSIS — C50919 Malignant neoplasm of unspecified site of unspecified female breast: Secondary | ICD-10-CM

## 2017-06-19 DIAGNOSIS — Z79899 Other long term (current) drug therapy: Secondary | ICD-10-CM | POA: Diagnosis not present

## 2017-06-19 LAB — CBC WITH DIFFERENTIAL/PLATELET
BASOS PCT: 1 %
Basophils Absolute: 0.1 10*3/uL (ref 0–0.1)
Eosinophils Absolute: 0.2 10*3/uL (ref 0–0.7)
Eosinophils Relative: 1 %
HEMATOCRIT: 37.2 % (ref 35.0–47.0)
Hemoglobin: 12.7 g/dL (ref 12.0–16.0)
LYMPHS ABS: 2 10*3/uL (ref 1.0–3.6)
LYMPHS PCT: 11 %
MCH: 34.4 pg — ABNORMAL HIGH (ref 26.0–34.0)
MCHC: 34.2 g/dL (ref 32.0–36.0)
MCV: 100.7 fL — AB (ref 80.0–100.0)
MONO ABS: 0.8 10*3/uL (ref 0.2–0.9)
MONOS PCT: 4 %
NEUTROS ABS: 15.6 10*3/uL — AB (ref 1.4–6.5)
Neutrophils Relative %: 83 %
Platelets: 299 10*3/uL (ref 150–440)
RBC: 3.69 MIL/uL — ABNORMAL LOW (ref 3.80–5.20)
RDW: 15.5 % — AB (ref 11.5–14.5)
WBC: 18.8 10*3/uL — ABNORMAL HIGH (ref 3.6–11.0)

## 2017-06-19 LAB — COMPREHENSIVE METABOLIC PANEL
ALBUMIN: 3.7 g/dL (ref 3.5–5.0)
ALK PHOS: 83 U/L (ref 38–126)
ALT: 17 U/L (ref 14–54)
ANION GAP: 10 (ref 5–15)
AST: 30 U/L (ref 15–41)
BUN: 24 mg/dL — ABNORMAL HIGH (ref 6–20)
CHLORIDE: 108 mmol/L (ref 101–111)
CO2: 19 mmol/L — AB (ref 22–32)
Calcium: 9.2 mg/dL (ref 8.9–10.3)
Creatinine, Ser: 1.05 mg/dL — ABNORMAL HIGH (ref 0.44–1.00)
GFR calc non Af Amer: 60 mL/min — ABNORMAL LOW (ref >60)
GLUCOSE: 178 mg/dL — AB (ref 65–99)
POTASSIUM: 3.4 mmol/L — AB (ref 3.5–5.1)
SODIUM: 137 mmol/L (ref 135–145)
Total Bilirubin: 0.3 mg/dL (ref 0.3–1.2)
Total Protein: 7.1 g/dL (ref 6.5–8.1)

## 2017-06-19 MED ORDER — TRASTUZUMAB CHEMO 150 MG IV SOLR
6.0000 mg/kg | Freq: Once | INTRAVENOUS | Status: AC
  Administered 2017-06-19: 357 mg via INTRAVENOUS
  Filled 2017-06-19: qty 17

## 2017-06-19 MED ORDER — ACETAMINOPHEN 325 MG PO TABS
650.0000 mg | ORAL_TABLET | Freq: Once | ORAL | Status: AC
  Administered 2017-06-19: 650 mg via ORAL
  Filled 2017-06-19: qty 2

## 2017-06-19 MED ORDER — HEPARIN SOD (PORK) LOCK FLUSH 100 UNIT/ML IV SOLN
500.0000 [IU] | Freq: Once | INTRAVENOUS | Status: AC
  Administered 2017-06-19: 500 [IU] via INTRAVENOUS
  Filled 2017-06-19: qty 5

## 2017-06-19 MED ORDER — SODIUM CHLORIDE 0.9% FLUSH
10.0000 mL | Freq: Once | INTRAVENOUS | Status: AC
  Administered 2017-06-19: 10 mL via INTRAVENOUS
  Filled 2017-06-19: qty 10

## 2017-06-19 MED ORDER — SODIUM CHLORIDE 0.9 % IV SOLN
Freq: Once | INTRAVENOUS | Status: AC
  Administered 2017-06-19: 11:00:00 via INTRAVENOUS
  Filled 2017-06-19: qty 1000

## 2017-06-19 MED ORDER — DIPHENHYDRAMINE HCL 25 MG PO CAPS
25.0000 mg | ORAL_CAPSULE | Freq: Once | ORAL | Status: AC
  Administered 2017-06-19: 25 mg via ORAL
  Filled 2017-06-19: qty 1

## 2017-06-19 NOTE — Progress Notes (Signed)
Pt here for follow up. Saw Dr Phillip Heal derm-and given new ABX for mottled areas on scalp w small raised  Red areas that pt stated cause mild pain and " itchy "  Stated that DR Phillip Heal wanted to cut off " looked like cancers that were 3 moles -" pt refd because she has no one to change the bandages .

## 2017-06-20 ENCOUNTER — Ambulatory Visit
Admission: RE | Admit: 2017-06-20 | Discharge: 2017-06-20 | Disposition: A | Discharge status: Home / Self Care | Payer: Medicare Other | Source: Ambulatory Visit | Attending: Radiation Oncology | Admitting: Radiation Oncology

## 2017-06-20 DIAGNOSIS — Z510 Encounter for antineoplastic radiation therapy: Secondary | ICD-10-CM | POA: Diagnosis not present

## 2017-06-21 ENCOUNTER — Ambulatory Visit
Admission: RE | Admit: 2017-06-21 | Discharge: 2017-06-21 | Disposition: A | Discharge status: Home / Self Care | Payer: Medicare Other | Source: Ambulatory Visit | Attending: Radiation Oncology | Admitting: Radiation Oncology

## 2017-06-21 DIAGNOSIS — Z510 Encounter for antineoplastic radiation therapy: Secondary | ICD-10-CM | POA: Diagnosis not present

## 2017-06-22 ENCOUNTER — Ambulatory Visit
Admission: RE | Admit: 2017-06-22 | Discharge: 2017-06-22 | Disposition: A | Discharge status: Home / Self Care | Payer: Medicare Other | Source: Ambulatory Visit | Attending: Radiation Oncology | Admitting: Radiation Oncology

## 2017-06-22 DIAGNOSIS — Z510 Encounter for antineoplastic radiation therapy: Secondary | ICD-10-CM | POA: Diagnosis not present

## 2017-06-23 ENCOUNTER — Ambulatory Visit
Admission: RE | Admit: 2017-06-23 | Discharge: 2017-06-23 | Disposition: A | Discharge status: Home / Self Care | Payer: Medicare Other | Source: Ambulatory Visit | Attending: Radiation Oncology | Admitting: Radiation Oncology

## 2017-06-23 DIAGNOSIS — Z510 Encounter for antineoplastic radiation therapy: Secondary | ICD-10-CM | POA: Diagnosis not present

## 2017-06-27 ENCOUNTER — Ambulatory Visit
Admission: RE | Admit: 2017-06-27 | Discharge: 2017-06-27 | Disposition: A | Discharge status: Home / Self Care | Payer: Medicare Other | Source: Ambulatory Visit | Attending: Radiation Oncology | Admitting: Radiation Oncology

## 2017-06-27 DIAGNOSIS — Z510 Encounter for antineoplastic radiation therapy: Secondary | ICD-10-CM | POA: Diagnosis not present

## 2017-06-28 ENCOUNTER — Ambulatory Visit
Admission: RE | Admit: 2017-06-28 | Discharge: 2017-06-28 | Disposition: A | Discharge status: Home / Self Care | Payer: Medicare Other | Source: Ambulatory Visit | Attending: Radiation Oncology | Admitting: Radiation Oncology

## 2017-06-28 DIAGNOSIS — Z510 Encounter for antineoplastic radiation therapy: Secondary | ICD-10-CM | POA: Diagnosis not present

## 2017-06-29 ENCOUNTER — Ambulatory Visit
Admission: RE | Admit: 2017-06-29 | Discharge: 2017-06-29 | Disposition: A | Discharge status: Home / Self Care | Payer: Medicare Other | Source: Ambulatory Visit | Attending: Radiation Oncology | Admitting: Radiation Oncology

## 2017-06-29 DIAGNOSIS — Z510 Encounter for antineoplastic radiation therapy: Secondary | ICD-10-CM | POA: Diagnosis not present

## 2017-06-30 ENCOUNTER — Ambulatory Visit
Admission: RE | Admit: 2017-06-30 | Discharge: 2017-06-30 | Disposition: A | Discharge status: Home / Self Care | Payer: Medicare Other | Source: Ambulatory Visit | Attending: Radiation Oncology | Admitting: Radiation Oncology

## 2017-06-30 DIAGNOSIS — Z510 Encounter for antineoplastic radiation therapy: Secondary | ICD-10-CM | POA: Diagnosis not present

## 2017-07-03 ENCOUNTER — Ambulatory Visit
Admission: RE | Admit: 2017-07-03 | Discharge: 2017-07-03 | Disposition: A | Discharge status: Home / Self Care | Payer: Medicare Other | Source: Ambulatory Visit | Attending: Radiation Oncology | Admitting: Radiation Oncology

## 2017-07-03 DIAGNOSIS — F419 Anxiety disorder, unspecified: Secondary | ICD-10-CM | POA: Diagnosis not present

## 2017-07-03 DIAGNOSIS — E0590 Thyrotoxicosis, unspecified without thyrotoxic crisis or storm: Secondary | ICD-10-CM | POA: Insufficient documentation

## 2017-07-03 DIAGNOSIS — K219 Gastro-esophageal reflux disease without esophagitis: Secondary | ICD-10-CM | POA: Insufficient documentation

## 2017-07-03 DIAGNOSIS — C50412 Malignant neoplasm of upper-outer quadrant of left female breast: Secondary | ICD-10-CM | POA: Insufficient documentation

## 2017-07-03 DIAGNOSIS — J449 Chronic obstructive pulmonary disease, unspecified: Secondary | ICD-10-CM | POA: Insufficient documentation

## 2017-07-03 DIAGNOSIS — N189 Chronic kidney disease, unspecified: Secondary | ICD-10-CM | POA: Diagnosis not present

## 2017-07-03 DIAGNOSIS — Z510 Encounter for antineoplastic radiation therapy: Secondary | ICD-10-CM | POA: Insufficient documentation

## 2017-07-03 DIAGNOSIS — Z170 Estrogen receptor positive status [ER+]: Secondary | ICD-10-CM | POA: Diagnosis not present

## 2017-07-03 DIAGNOSIS — F329 Major depressive disorder, single episode, unspecified: Secondary | ICD-10-CM | POA: Diagnosis not present

## 2017-07-03 DIAGNOSIS — M5136 Other intervertebral disc degeneration, lumbar region: Secondary | ICD-10-CM | POA: Diagnosis not present

## 2017-07-03 DIAGNOSIS — G43909 Migraine, unspecified, not intractable, without status migrainosus: Secondary | ICD-10-CM | POA: Insufficient documentation

## 2017-07-04 ENCOUNTER — Ambulatory Visit
Admission: RE | Admit: 2017-07-04 | Discharge: 2017-07-04 | Disposition: A | Discharge status: Home / Self Care | Payer: Medicare Other | Source: Ambulatory Visit | Attending: Radiation Oncology | Admitting: Radiation Oncology

## 2017-07-04 DIAGNOSIS — Z510 Encounter for antineoplastic radiation therapy: Secondary | ICD-10-CM | POA: Diagnosis not present

## 2017-07-05 ENCOUNTER — Ambulatory Visit
Admission: RE | Admit: 2017-07-05 | Discharge: 2017-07-05 | Disposition: A | Discharge status: Home / Self Care | Payer: Medicare Other | Source: Ambulatory Visit | Attending: Radiation Oncology | Admitting: Radiation Oncology

## 2017-07-05 DIAGNOSIS — Z510 Encounter for antineoplastic radiation therapy: Secondary | ICD-10-CM | POA: Diagnosis not present

## 2017-07-06 ENCOUNTER — Ambulatory Visit
Admission: RE | Admit: 2017-07-06 | Discharge: 2017-07-06 | Disposition: A | Discharge status: Home / Self Care | Payer: Medicare Other | Source: Ambulatory Visit | Attending: Radiation Oncology | Admitting: Radiation Oncology

## 2017-07-06 DIAGNOSIS — Z510 Encounter for antineoplastic radiation therapy: Secondary | ICD-10-CM | POA: Diagnosis not present

## 2017-07-07 ENCOUNTER — Ambulatory Visit
Admission: RE | Admit: 2017-07-07 | Discharge: 2017-07-07 | Disposition: A | Discharge status: Home / Self Care | Payer: Medicare Other | Source: Ambulatory Visit | Attending: Radiation Oncology | Admitting: Radiation Oncology

## 2017-07-07 DIAGNOSIS — Z510 Encounter for antineoplastic radiation therapy: Secondary | ICD-10-CM | POA: Diagnosis not present

## 2017-07-08 NOTE — Progress Notes (Signed)
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  INTERVAL HISTORY: Patient returns to clinic today for further evaluation and consideration of cycle 6 of maintenance Herceptin.  The folliculitis/rash is persistent on her scalp but not any worse.  She otherwise feels well and is asymptomatic. She has no neurologic complaints.  She denies any recent fevers or illnesses.  She has a good appetite and denies weight loss.  She has no chest pain, cough, hemoptysis, or shortness of breath. She denies any nausea, vomiting, constipation, or diarrhea.  She has no urinary complaints.  Patient offers no further specific complaints today.  REVIEW OF SYSTEMS:   Review of Systems  Constitutional: Negative.  Negative for fever, malaise/fatigue and weight loss.  Respiratory: Negative.  Negative for cough and shortness of breath.   Cardiovascular: Negative.  Negative for chest pain and leg swelling.  Gastrointestinal: Negative.  Negative for abdominal pain.  Genitourinary: Negative.  Negative for dysuria.  Musculoskeletal: Negative.  Negative for joint pain.  Skin: Positive for rash. Negative for itching.  Neurological: Negative.  Negative for sensory change, focal weakness and weakness.  Psychiatric/Behavioral: Negative.  The patient is not nervous/anxious and does not have insomnia.     As per HPI. Otherwise, a complete review of systems is negative.  PAST MEDICAL HISTORY: Past Medical History:  Diagnosis Date  . Abnormal Pap smear of cervix    01/2015 ascus/neg- 04/2015 ascus/neg  . Allergy   . Anxiety   . Arthritis   . Asthma   . Chronic kidney disease   . COPD (chronic obstructive  pulmonary disease) ([place])   . DDD (degenerative disc disease), lumbar   . Depression   . Elevated lipids   . Fibromyalgia   . Fibromyalgia   . Genetic testing 03/09/2017   Multi-Cancer panel (83 genes) @ [facility] - No pathogenic mutations detected  . GERD (gastroesophageal reflux disease)   . History of IBS   . Hyperthyroidism   . Joint disease   . Migraine   . Migraine   . Oxygen deficiency   . Restless leg syndrome   . Sleep apnea    Does not use C-PAP, cannot tolerate mask    PAST SURGICAL HISTORY: Past Surgical History:  Procedure Laterality Date  . ABDOMINAL HYSTERECTOMY  2008  . BREAST BIOPSY Left 02/02/2017   [place] core path pending  . BREAST EXCISIONAL BIOPSY Left 02/17/2017   lumpectomy with nl sn  . BREAST LUMPECTOMY WITH NEEDLE LOCALIZATION Left 02/17/2017   Procedure: BREAST LUMPECTOMY WITH NEEDLE LOCALIZATION;  Surgeon: Vickie Epley, MD;  Location: [facility];  Service: General;  Laterality: Left;  . CARPAL TUNNEL RELEASE Bilateral   . cryotherapy    . DILATION AND CURETTAGE OF UTERUS    . ENDOMETRIAL ABLATION    . LAPAROSCOPIC OOPHERECTOMY Left    unsure which side but thinks its the left  . LYSIS OF ADHESION Left 10/14/2015   Procedure: LYSIS OF ADHESION;  Surgeon: Thornton Park, MD;  Location: [facility];  Service: Orthopedics;  Laterality: Left;  . NECK SURGERY     lower neck fusion rods and screws  . PORTA CATH INSERTION N/A 03/08/2017   Procedure: PORTA CATH INSERTION;  Surgeon: Hortencia Pilar  G, MD;  Location: [facility];  Service: Cardiovascular;  Laterality: N/A;  . RESECTION DISTAL CLAVICAL Left 10/14/2015   Procedure: RESECTION DISTAL CLAVICAL;  Surgeon: Thornton Park, MD;  Location: [facility];  Service: Orthopedics;  Laterality: Left;  . SENTINEL NODE BIOPSY Left 02/17/2017   Procedure: SENTINEL NODE BIOPSY;  Surgeon: Vickie Epley, MD;  Location: [facility];  Service: General;  Laterality: Left;  . SHOULDER ARTHROSCOPY WITH OPEN ROTATOR  CUFF REPAIR Left 10/14/2015   Procedure: SHOULDER ARTHROSCOPY WITH OPEN ROTATOR CUFF REPAIR;  Surgeon: Thornton Park, MD;  Location: [facility];  Service: Orthopedics;  Laterality: Left;  . SHOULDER ARTHROSCOPY WITH OPEN ROTATOR CUFF REPAIR AND DISTAL CLAVICLE ACROMINECTOMY Right 09/03/2014   Procedure: RIGHT SHOULDER ARTHROSCOPY WITH MINI OPEN ROTATOR CUFF TEAR;  Surgeon: Thornton Park, MD;  Location: [facility];  Service: Orthopedics;  Laterality: Right;  biceps tenodesis, arthroscopic subacromial decompression and distal clavicle incision  . spinal injections    . SUBACROMIAL DECOMPRESSION Left 10/14/2015   Procedure: SUBACROMIAL DECOMPRESSION;  Surgeon: Thornton Park, MD;  Location: [facility];  Service: Orthopedics;  Laterality: Left;    FAMILY HISTORY: Family History  Problem Relation Age of Onset  . Breast cancer Mother 49       Glioblastoma also; deceased at 92  . Diabetes Father   . Lung cancer Father        mesothelioma; deceased 69s  . Cancer Maternal Aunt        "bone ca"; unk. primary  . Colon cancer Maternal Grandfather        dx in 45s; deceased 74  . Colon cancer Maternal Aunt        dx in 39s; currently 13s  . Lung cancer Maternal Grandmother        smoker; deceased 7  . Lung cancer Paternal Grandmother        smoker; deceased 36s  . Breast cancer Cousin        dx 50s; daughter of mat aunt with unk. primary cancer  . Cancer Other        distant cousin; unknown primary  . Colon cancer Other        dx 41s; currently 70; maternal half-sister  . Bipolar disorder Sister   . Schizophrenia Sister     ADVANCED DIRECTIVES (Y/N):  N  HEALTH MAINTENANCE: Social History   Tobacco Use  . Smoking status: Current Every Day Smoker    Packs/day: 1.00    Types: Cigarettes  . Smokeless tobacco: Never Used  Substance Use Topics  . Alcohol use: No    Alcohol/week: 0.0 oz  . Drug use: No     Colonoscopy:  PAP:  Bone density:  Lipid panel:  Allergies  Allergen  Reactions  . Iodine   . Bee Venom Hives and Rash  . Cefoxitin Rash  . Cefuroxime Axetil Hives and Rash  . Cucumber Extract Rash  . Gadolinium Derivatives Swelling, Other (See Comments) and Rash    NECK BECAME RED AND TONGUE WAS SWELLING SLIGHTLY PER PT NECK BECAME RED AND TONGUE WAS SWELLING SLIGHTLY PER PT NECK BECAME RED AND TONGUE WAS SWELLING SLIGHTLY PER PT  . Iodinated Diagnostic Agents Hives and Rash  . Latex Rash  . Other Hives and Rash    Bee Stings-swelling/hives/rash Wool (textile fiber)-Rash/itching  . Tomato Rash    Red tomatoes    Current Outpatient Medications  Medication Sig Dispense Refill  . albuterol (ACCUNEB) 0.63 MG/3ML nebulizer solution Take 1 ampule by  nebulization every 6 (six) hours as needed for wheezing.    Marland Kitchen albuterol (PROAIR HFA) 108 (90 Base) MCG/ACT inhaler ProAir HFA 90 mcg/actuation aerosol inhaler    . baclofen (LIORESAL) 10 MG tablet Take 10 mg by mouth 3 (three) times daily as needed for muscle spasms (typically 2-3 times daily).     . cholecalciferol (VITAMIN D) 1000 UNITS tablet Take 1,000 Units by mouth 2 (two) times daily.     . clobetasol (OLUX) 0.05 % topical foam     . diclofenac sodium (VOLTAREN) 1 % GEL Apply 2 g topically 4 (four) times daily as needed (for pain.).     Marland Kitchen doxepin (SINEQUAN) 10 MG capsule Take 1-2 capsules (10-20 mg total) by mouth at bedtime as needed. 180 capsule 1  . EPINEPHrine (EPIPEN JR) 0.15 MG/0.3ML injection Inject 0.15 mg into the muscle as needed for anaphylaxis.    . hydrocortisone 2.5 % cream Apply 1 application topically 2 (two) times daily as needed (for itchy/dry skin.).    Marland Kitchen lidocaine-prilocaine (EMLA) cream Apply to affected area once 30 g 3  . linaclotide (LINZESS) 145 MCG CAPS capsule Take 145 mcg by mouth daily as needed (for constipation.).    Marland Kitchen magnesium oxide (MAG-OX) 400 MG tablet Take 400 mg by mouth daily. Reported on 07/08/2015    . Multiple Vitamins-Minerals (HAIR SKIN AND NAILS FORMULA) TABS  Take 2 tablets by mouth daily.     Marland Kitchen nystatin cream (MYCOSTATIN)     . NYSTATIN powder  APPLY TOPICALLY FOUR TIMES DAILY 15 g 1  . omeprazole (PRILOSEC) 20 MG capsule Take 20 mg by mouth daily before breakfast.     . ondansetron (ZOFRAN) 8 MG tablet Take 1 tablet (8 mg total) by mouth 2 (two) times daily as needed (Nausea or vomiting). 30 tablet 2  . oxyCODONE 10 MG TABS Take 1 tablet (10 mg total) by mouth every 4 (four) hours as needed for breakthrough pain. 60 tablet 0  . prochlorperazine (COMPAZINE) 10 MG tablet Take 1 tablet (10 mg total) by mouth every 6 (six) hours as needed (Nausea or vomiting). 60 tablet 2  . promethazine (PHENERGAN) 25 MG tablet Take 25 mg by mouth every 6 (six) hours as needed for nausea or vomiting.    . propranolol (INDERAL) 10 MG tablet Take 1 tablet (10 mg total) by mouth 2 (two) times daily as needed. For severe anxiety sx 60 tablet 1  . rizatriptan (MAXALT-MLT) 10 MG disintegrating tablet Take 10 mg by mouth as needed for migraine. May repeat in 2 hours if needed    . rosuvastatin (CRESTOR) 40 MG tablet Take 40 mg by mouth at bedtime.     . topiramate (TOPAMAX) 100 MG tablet Take 200 mg by mouth daily.     Marland Kitchen triamcinolone ointment (KENALOG) 0.5 % Apply 1 application topically 2 (two) times daily. 30 g 0  . vitamin E (VITAMIN E) 200 UNIT capsule Take 200 Units by mouth 4 (four) times a week. Take 4-5 times a week     No current facility-administered medications for this visit.    Facility-Administered Medications Ordered in Other Visits  Medication Dose Route Frequency Provider Last Rate Last Dose  . heparin lock flush 100 unit/mL  500 Units Intravenous Once Lloyd Huger, MD        OBJECTIVE: Vitals:   07/10/17 0931 07/10/17 0936  BP:  106/75  Pulse:  84  Resp: 12   Temp:  97.6 F (36.4 C)  Body mass index is 26.76 kg/m.    ECOG FS:0 - Asymptomatic  General: Well-developed, well-nourished, no acute distress. Eyes: Pink conjunctiva,  anicteric sclera. HEENT: Normocephalic, moist mucous membranes, clear oropharnyx. Lungs: Clear to auscultation bilaterally. Heart: Regular rate and rhythm. No rubs, murmurs, or gallops. Abdomen: Soft, nontender, nondistended. No organomegaly noted, normoactive bowel sounds. Musculoskeletal: No edema, cyanosis, or clubbing. Neuro: Alert, answering all questions appropriately. Cranial nerves grossly intact. Skin: Mild folliculitis on scalp. Psych: Normal affect.  LAB RESULTS:  Lab Results  Component Value Date   NA 140 07/10/2017   K 3.4 (L) 07/10/2017   CL 111 07/10/2017   CO2 22 07/10/2017   GLUCOSE 108 (H) 07/10/2017   BUN 25 (H) 07/10/2017   CREATININE 0.93 07/10/2017   CALCIUM 9.3 07/10/2017   PROT 7.0 07/10/2017   ALBUMIN 3.8 07/10/2017   AST 18 07/10/2017   ALT 14 07/10/2017   ALKPHOS 70 07/10/2017   BILITOT 0.5 07/10/2017   GFRNONAA >60 07/10/2017   GFRAA >60 07/10/2017    Lab Results  Component Value Date   WBC 9.3 07/10/2017   NEUTROABS 7.0 (H) 07/10/2017   HGB 12.9 07/10/2017   HCT 36.8 07/10/2017   MCV 100.1 (H) 07/10/2017   PLT 278 07/10/2017     STUDIES: Nm Cardiac Muga Rest  Result Date: 06/13/2017 CLINICAL DATA:  Breast cancer, cardiotoxic chemotherapy EXAM: NUCLEAR MEDICINE CARDIAC BLOOD POOL IMAGING (MUGA) TECHNIQUE: Cardiac multi-gated acquisition was performed at rest following intravenous injection of Tc-37mlabeled red blood cells. RADIOPHARMACEUTICALS:  20.94 mCi Tc-960mertechnetate in-vitro labeled red blood cells IV COMPARISON:  03/07/2017 FINDINGS: Calculated LEFT ventricular ejection fraction is 53.2% unchanged from 53.8% on prior exam. Study performed at a cardiac rate of 89 beats per minute. Patient was rhythmic during imaging. Normal LEFT ventricular wall motion. IMPRESSION: Normal LEFT ventricular ejection fraction of 53.2%, unchanged. Normal LV wall motion. Electronically Signed   By: MaLavonia Dana.D.   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  PLAN:    1. 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  MUGA scan on Jun 13, 2017 revealed an EF of 53.2% which is unchanged from previous.  Proceed with cycle 6 of maintenance Herceptin today.  Continue daily XRT completing on August 11, 2017. Because patient has an ER/PR positive malignancy, she will benefit from an aromatase inhibitor for 5 years at the conclusion of all her treatments.  Return to clinic in 3 weeks for Herceptin only and then in 6 weeks for further evaluation and consideration of cycle 8 of maintenance Herceptin.   2.  Genetic testing: Negative.  No further interventions are needed. 3.  Joint pain: Patient does not complain of this today.  Continue symptomatic treatment as directed. 4.  Anxiety: Continue evaluation and treatment per psychiatry. 5.  Chronic pain: Chronic.  Continue appointments with pain clinic as scheduled. 6.  Difficulty sleeping:  Continue Ambien as needed. 7.  Rash: Treatment per dermatology. 8.  Weakness and fatigue: Patient was previously given a referral to the CARE program.    Approximately 30 minutes was spent in discussion of which greater than 50% was consultation.  Patient expressed understanding and was in agreement with this plan. She also understands that She can call clinic at any time with any  questions, concerns, or complaints.   Cancer Staging Malignant neoplasm of upper-outer quadrant of left breast in female, estrogen receptor positive ([place]) Staging form: Breast, AJCC 8th Edition - Clinical stage from 02/07/2017: Stage IA (cT1c, cN0, cM0, G1, ER: Positive, PR: Positive, HER2: Positive) - Signed by Lloyd Huger, MD on 02/11/2017   Lloyd Huger, MD   07/10/2017 1:44  PM

## 2017-07-10 ENCOUNTER — Inpatient Hospital Stay: Payer: Medicare Other

## 2017-07-10 ENCOUNTER — Encounter: Payer: Self-pay | Admitting: Oncology

## 2017-07-10 ENCOUNTER — Ambulatory Visit
Admission: RE | Admit: 2017-07-10 | Discharge: 2017-07-10 | Disposition: A | Discharge status: Home / Self Care | Payer: Medicare Other | Source: Ambulatory Visit | Attending: Radiation Oncology | Admitting: Radiation Oncology

## 2017-07-10 ENCOUNTER — Other Ambulatory Visit: Payer: Self-pay

## 2017-07-10 ENCOUNTER — Inpatient Hospital Stay: Payer: Medicare Other | Attending: Oncology

## 2017-07-10 ENCOUNTER — Inpatient Hospital Stay (HOSPITAL_BASED_OUTPATIENT_CLINIC_OR_DEPARTMENT_OTHER): Payer: Medicare Other | Admitting: Oncology

## 2017-07-10 VITALS — BP 106/75 | HR 84 | Temp 97.6°F | Resp 12 | Ht 61.0 in | Wt 141.6 lb

## 2017-07-10 DIAGNOSIS — C50412 Malignant neoplasm of upper-outer quadrant of left female breast: Secondary | ICD-10-CM | POA: Diagnosis not present

## 2017-07-10 DIAGNOSIS — Z79899 Other long term (current) drug therapy: Secondary | ICD-10-CM | POA: Insufficient documentation

## 2017-07-10 DIAGNOSIS — F17210 Nicotine dependence, cigarettes, uncomplicated: Secondary | ICD-10-CM | POA: Insufficient documentation

## 2017-07-10 DIAGNOSIS — F419 Anxiety disorder, unspecified: Secondary | ICD-10-CM | POA: Insufficient documentation

## 2017-07-10 DIAGNOSIS — C50919 Malignant neoplasm of unspecified site of unspecified female breast: Secondary | ICD-10-CM

## 2017-07-10 DIAGNOSIS — Z5111 Encounter for antineoplastic chemotherapy: Secondary | ICD-10-CM | POA: Diagnosis not present

## 2017-07-10 DIAGNOSIS — Z170 Estrogen receptor positive status [ER+]: Secondary | ICD-10-CM

## 2017-07-10 DIAGNOSIS — Z510 Encounter for antineoplastic radiation therapy: Secondary | ICD-10-CM | POA: Diagnosis not present

## 2017-07-10 LAB — COMPREHENSIVE METABOLIC PANEL
ALBUMIN: 3.8 g/dL (ref 3.5–5.0)
ALK PHOS: 70 U/L (ref 38–126)
ALT: 14 U/L (ref 14–54)
ANION GAP: 7 (ref 5–15)
AST: 18 U/L (ref 15–41)
BUN: 25 mg/dL — ABNORMAL HIGH (ref 6–20)
CALCIUM: 9.3 mg/dL (ref 8.9–10.3)
CO2: 22 mmol/L (ref 22–32)
Chloride: 111 mmol/L (ref 101–111)
Creatinine, Ser: 0.93 mg/dL (ref 0.44–1.00)
GFR calc Af Amer: >60 mL/min (ref >60)
GFR calc non Af Amer: >60 mL/min (ref >60)
GLUCOSE: 108 mg/dL — AB (ref 65–99)
POTASSIUM: 3.4 mmol/L — AB (ref 3.5–5.1)
SODIUM: 140 mmol/L (ref 135–145)
Total Bilirubin: 0.5 mg/dL (ref 0.3–1.2)
Total Protein: 7 g/dL (ref 6.5–8.1)

## 2017-07-10 LAB — CBC WITH DIFFERENTIAL/PLATELET
Basophils Absolute: 0.1 10*3/uL (ref 0–0.1)
Basophils Relative: 1 %
EOS PCT: 4 %
Eosinophils Absolute: 0.4 10*3/uL (ref 0–0.7)
HEMATOCRIT: 36.8 % (ref 35.0–47.0)
Hemoglobin: 12.9 g/dL (ref 12.0–16.0)
LYMPHS PCT: 13 %
Lymphs Abs: 1.2 10*3/uL (ref 1.0–3.6)
MCH: 35.1 pg — ABNORMAL HIGH (ref 26.0–34.0)
MCHC: 35.1 g/dL (ref 32.0–36.0)
MCV: 100.1 fL — ABNORMAL HIGH (ref 80.0–100.0)
MONO ABS: 0.6 10*3/uL (ref 0.2–0.9)
MONOS PCT: 6 %
NEUTROS ABS: 7 10*3/uL — AB (ref 1.4–6.5)
Neutrophils Relative %: 76 %
Platelets: 278 10*3/uL (ref 150–440)
RBC: 3.67 MIL/uL — ABNORMAL LOW (ref 3.80–5.20)
RDW: 14.7 % — AB (ref 11.5–14.5)
WBC: 9.3 10*3/uL (ref 3.6–11.0)

## 2017-07-10 MED ORDER — HEPARIN SOD (PORK) LOCK FLUSH 100 UNIT/ML IV SOLN
500.0000 [IU] | Freq: Once | INTRAVENOUS | Status: DC
  Filled 2017-07-10: qty 5

## 2017-07-10 MED ORDER — SODIUM CHLORIDE 0.9% FLUSH
10.0000 mL | Freq: Once | INTRAVENOUS | Status: AC
  Administered 2017-07-10: 10 mL via INTRAVENOUS
  Filled 2017-07-10: qty 10

## 2017-07-10 MED ORDER — DIPHENHYDRAMINE HCL 25 MG PO CAPS
25.0000 mg | ORAL_CAPSULE | Freq: Once | ORAL | Status: AC
  Administered 2017-07-10: 25 mg via ORAL
  Filled 2017-07-10: qty 1

## 2017-07-10 MED ORDER — HEPARIN SOD (PORK) LOCK FLUSH 100 UNIT/ML IV SOLN
500.0000 [IU] | Freq: Once | INTRAVENOUS | Status: AC | PRN
  Administered 2017-07-10: 500 [IU]

## 2017-07-10 MED ORDER — SODIUM CHLORIDE 0.9 % IV SOLN
Freq: Once | INTRAVENOUS | Status: AC
  Administered 2017-07-10: 10:00:00 via INTRAVENOUS
  Filled 2017-07-10: qty 1000

## 2017-07-10 MED ORDER — ACETAMINOPHEN 325 MG PO TABS
650.0000 mg | ORAL_TABLET | Freq: Once | ORAL | Status: AC
Start: 2017-07-10 — End: 2017-07-10
  Administered 2017-07-10: 650 mg via ORAL
  Filled 2017-07-10: qty 2

## 2017-07-10 MED ORDER — TRASTUZUMAB CHEMO 150 MG IV SOLR
6.0000 mg/kg | Freq: Once | INTRAVENOUS | Status: AC
  Administered 2017-07-10: 357 mg via INTRAVENOUS
  Filled 2017-07-10: qty 17

## 2017-07-10 NOTE — Progress Notes (Signed)
Patient here for follow up. No changes since last appt. 

## 2017-07-11 ENCOUNTER — Ambulatory Visit
Admission: RE | Admit: 2017-07-11 | Discharge: 2017-07-11 | Disposition: A | Discharge status: Home / Self Care | Payer: Medicare Other | Source: Ambulatory Visit | Attending: Radiation Oncology | Admitting: Radiation Oncology

## 2017-07-11 DIAGNOSIS — Z510 Encounter for antineoplastic radiation therapy: Secondary | ICD-10-CM | POA: Diagnosis not present

## 2017-07-12 ENCOUNTER — Ambulatory Visit
Admission: RE | Admit: 2017-07-12 | Discharge: 2017-07-12 | Disposition: A | Discharge status: Home / Self Care | Payer: Medicare Other | Source: Ambulatory Visit | Attending: Radiation Oncology | Admitting: Radiation Oncology

## 2017-07-12 DIAGNOSIS — Z510 Encounter for antineoplastic radiation therapy: Secondary | ICD-10-CM | POA: Diagnosis not present

## 2017-07-13 ENCOUNTER — Ambulatory Visit
Admission: RE | Admit: 2017-07-13 | Discharge: 2017-07-13 | Disposition: A | Discharge status: Home / Self Care | Payer: Medicare Other | Source: Ambulatory Visit | Attending: Radiation Oncology | Admitting: Radiation Oncology

## 2017-07-13 DIAGNOSIS — Z510 Encounter for antineoplastic radiation therapy: Secondary | ICD-10-CM | POA: Diagnosis not present

## 2017-07-14 ENCOUNTER — Ambulatory Visit
Admission: RE | Admit: 2017-07-14 | Discharge: 2017-07-14 | Disposition: A | Discharge status: Home / Self Care | Payer: Medicare Other | Source: Ambulatory Visit | Attending: Radiation Oncology | Admitting: Radiation Oncology

## 2017-07-14 DIAGNOSIS — Z510 Encounter for antineoplastic radiation therapy: Secondary | ICD-10-CM | POA: Diagnosis not present

## 2017-07-17 ENCOUNTER — Ambulatory Visit
Admission: RE | Admit: 2017-07-17 | Discharge: 2017-07-17 | Disposition: A | Discharge status: Home / Self Care | Payer: Medicare Other | Source: Ambulatory Visit | Attending: Radiation Oncology | Admitting: Radiation Oncology

## 2017-07-17 DIAGNOSIS — Z510 Encounter for antineoplastic radiation therapy: Secondary | ICD-10-CM | POA: Diagnosis not present

## 2017-07-18 ENCOUNTER — Ambulatory Visit
Admission: RE | Admit: 2017-07-18 | Discharge: 2017-07-18 | Disposition: A | Discharge status: Home / Self Care | Payer: Medicare Other | Source: Ambulatory Visit | Attending: Radiation Oncology | Admitting: Radiation Oncology

## 2017-07-18 DIAGNOSIS — Z510 Encounter for antineoplastic radiation therapy: Secondary | ICD-10-CM | POA: Diagnosis not present

## 2017-07-19 ENCOUNTER — Ambulatory Visit: Payer: Medicare Other | Admitting: Licensed Clinical Social Worker

## 2017-07-19 ENCOUNTER — Ambulatory Visit (INDEPENDENT_AMBULATORY_CARE_PROVIDER_SITE_OTHER): Payer: Medicare Other | Admitting: Psychiatry

## 2017-07-19 ENCOUNTER — Ambulatory Visit
Admission: RE | Admit: 2017-07-19 | Discharge: 2017-07-19 | Disposition: A | Discharge status: Home / Self Care | Payer: Medicare Other | Source: Ambulatory Visit | Attending: Radiation Oncology | Admitting: Radiation Oncology

## 2017-07-19 ENCOUNTER — Encounter: Payer: Self-pay | Admitting: Psychiatry

## 2017-07-19 VITALS — BP 95/68 | HR 94 | Ht 61.0 in | Wt 140.0 lb

## 2017-07-19 DIAGNOSIS — Z510 Encounter for antineoplastic radiation therapy: Secondary | ICD-10-CM | POA: Diagnosis not present

## 2017-07-19 DIAGNOSIS — G4700 Insomnia, unspecified: Secondary | ICD-10-CM | POA: Diagnosis not present

## 2017-07-19 DIAGNOSIS — F4322 Adjustment disorder with anxiety: Secondary | ICD-10-CM | POA: Diagnosis not present

## 2017-07-19 DIAGNOSIS — F17200 Nicotine dependence, unspecified, uncomplicated: Secondary | ICD-10-CM

## 2017-07-19 MED ORDER — TEMAZEPAM 7.5 MG PO CAPS: 7.5000 mg | ORAL_CAPSULE | Freq: Every evening | ORAL | 1 refills | Status: DC | PRN

## 2017-07-19 NOTE — Patient Instructions (Signed)
Temazepam tablets or capsules What is this medicine? TEMAZEPAM (te MAZ e pam) is a benzodiazepine. It is used to help you to fall asleep and sleep through the night. This medicine may be used for other purposes; ask your health care provider or pharmacist if you have questions. COMMON BRAND NAME(S): Restoril What should I tell my health care provider before I take this medicine? They need to know if you have any of these conditions: -an alcohol or drug abuse problem -bipolar disorder, depression, psychosis or other mental health condition -kidney disease -liver disease -lung or breathing disease -myasthenia gravis -Parkinson's disease -porphyria -seizures or a history of seizures -suicidal thoughts -an unusual or allergic reaction to temazepam, other benzodiazepines, other medicines, foods, dyes, or preservatives -pregnant or trying to get pregnant -breast-feeding How should I use this medicine? Take this medicine by mouth. It is only for use at bedtime. Follow the directions on the prescription label. Swallow the tablets or capsules with a drink of water. If it upsets your stomach, take it with food or milk. Do not take your medicine more often than directed. Do not stop taking except on the advice of your doctor or health care professional. A special MedGuide will be given to you by the pharmacist with each prescription and refill. Be sure to read this information carefully each time. Talk to your pediatrician regarding the use of this medicine in children. Special care may be needed. Overdosage: If you think you have taken too much of this medicine contact a poison control center or emergency room at once. NOTE: This medicine is only for you. Do not share this medicine with others. What if I miss a dose? If you miss a dose, take it as soon as you can. If it is almost time for your next dose, take only that dose. Do not take double or extra doses. What may interact with this  medicine? Do not take this medicine with any of the following medications: -narcotic medicines for cough -sodium oxybate This medicine may also interact with the following medications: -alcohol -antihistamines for allergy, cough and cold -certain medicines for anxiety or sleep -certain medicines for depression, like amitriptyline, fluoxetine, sertraline -certain medicines for seizures like phenobarbital, primidone -general anesthetics like lidocaine, pramoxine, tetracaine -medicines that relax muscles for surgery -narcotic medicines for pain -phenothiazines like chlorpromazine, mesoridazine, prochlorperazine, thioridazine This list may not describe all possible interactions. Give your health care provider a list of all the medicines, herbs, non-prescription drugs, or dietary supplements you use. Also tell them if you smoke, drink alcohol, or use illegal drugs. Some items may interact with your medicine. What should I watch for while using this medicine? Tell your doctor or health care professional if your symptoms do not start to get better or if they get worse. Do not stop taking except on your doctor's advice. You may develop a severe reaction. Your doctor will tell you how much medicine to take. You may get drowsy or dizzy. Do not drive, use machinery, or do anything that needs mental alertness until you know how this medicine affects you. Do not stand or sit up quickly, especially if you are an older patient. This reduces the risk of dizzy or fainting spells. Alcohol may interfere with the effect of this medicine. Avoid alcoholic drinks. If you are taking another medicine that also causes drowsiness, you may have more side effects. Give your health care provider a list of all medicines you use. Your doctor will tell you how   much medicine to take. Do not take more medicine than directed. Call emergency for help if you have problems breathing or unusual sleepiness. After taking this medicine  for sleep, you may get up out of bed while not being fully awake and do an activity that you do not know you are doing. The next morning, you may have no memory of the event. Activities such as driving a car ("sleep-driving"), making and eating food, talking on the phone, sexual activity, and sleep-walking have been reported. Call your doctor right away if you find out you have done any of these activities. Do not take this medicine if you have used alcohol that evening or before bed or taken another medicine for sleep since your risk of doing these sleep-related activities will be increased. Do not take this medicine unless you are able to stay in bed for a full night (7 to 8 hour) before you must be active again. You may have a decrease in mental alertness the day after use, even if you feel that you are fully awake. Tell your doctor if you will need to perform activities requiring full alertness, such as driving, the next day. Do not stand or sit up quickly after taking this medicine, especially if you are an older patient. This reduces the risk of dizzy or fainting spells. If you or your family notice any changes in your behavior, such as new or worsening depression, thoughts of harming yourself, anxiety, other unusual or disturbing thoughts, or memory loss, call your doctor right away. After you stop taking this medicine, you may have trouble falling asleep. This is called rebound insomnia. This problem usually goes away on its own after 1 or 2 nights. Women should inform their doctor if they wish to become pregnant or think they might be pregnant. There is a potential for serious side effects to an unborn child. Talk to your health care professional or pharmacist for more information. What side effects may I notice from receiving this medicine? Side effects that you should report to your doctor or health care professional as soon as possible: -allergic reactions like skin rash, itching or hives,  swelling of the face, lips, or tongue -breathing problems -confusion -loss of balance or coordination -signs and symptoms of low blood pressure like dizziness; feeling faint or lightheaded, falls; unusually weak or tired -suicidal thoughts or other mood changes -unusual activities while asleep like driving, eating, making phone calls Side effects that usually do not require medical attention (report to your doctor or health care professional if they continue or are bothersome): -diarrhea -dizziness -nausea, vomiting -tiredness This list may not describe all possible side effects. Call your doctor for medical advice about side effects. You may report side effects to FDA at 1-800-FDA-1088. Where should I keep my medicine? Keep out of the reach of children. This medicine can be abused. Keep your medicine in a safe place to protect it from theft. Do not share this medicine with anyone. Selling or giving away this medicine is dangerous and against the law. This medicine may cause accidental overdose and death if taken by other adults, children, or pets. Mix any unused medicine with a substance like cat litter or coffee grounds. Then throw the medicine away in a sealed container like a sealed bag or a coffee can with a lid. Do not use the medicine after the expiration date. Store at room temperature below 30 degrees C (86 degrees F). Protect from light. Keep container tightly closed. NOTE:   This sheet is a summary. It may not cover all possible information. If you have questions about this medicine, talk to your doctor, pharmacist, or health care provider.  2018 Elsevier/Gold Standard (2014-10-16 23:44:07)  
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
## 2017-07-20 ENCOUNTER — Ambulatory Visit
Admission: RE | Admit: 2017-07-20 | Discharge: 2017-07-20 | Disposition: A | Discharge status: Home / Self Care | Payer: Medicare Other | Source: Ambulatory Visit | Attending: Radiation Oncology | Admitting: Radiation Oncology

## 2017-07-20 ENCOUNTER — Encounter: Payer: Self-pay | Admitting: Psychiatry

## 2017-07-20 DIAGNOSIS — Z510 Encounter for antineoplastic radiation therapy: Secondary | ICD-10-CM | POA: Diagnosis not present

## 2017-07-21 ENCOUNTER — Ambulatory Visit
Admission: RE | Admit: 2017-07-21 | Discharge: 2017-07-21 | Disposition: A | Discharge status: Home / Self Care | Payer: Medicare Other | Source: Ambulatory Visit | Attending: Radiation Oncology | Admitting: Radiation Oncology

## 2017-07-21 DIAGNOSIS — Z510 Encounter for antineoplastic radiation therapy: Secondary | ICD-10-CM | POA: Diagnosis not present

## 2017-07-24 ENCOUNTER — Ambulatory Visit: Payer: Medicare Other

## 2017-07-25 ENCOUNTER — Other Ambulatory Visit: Payer: Self-pay | Admitting: *Deleted

## 2017-07-25 ENCOUNTER — Ambulatory Visit
Admission: RE | Admit: 2017-07-25 | Discharge: 2017-07-25 | Disposition: A | Discharge status: Home / Self Care | Payer: Medicare Other | Source: Ambulatory Visit | Attending: Radiation Oncology | Admitting: Radiation Oncology

## 2017-07-25 DIAGNOSIS — Z510 Encounter for antineoplastic radiation therapy: Secondary | ICD-10-CM | POA: Diagnosis not present

## 2017-07-25 MED ORDER — SILVER SULFADIAZINE 1 % EX CREA: 1.0000 "application " | TOPICAL_CREAM | Freq: Two times a day (BID) | CUTANEOUS | 6 refills | Status: DC

## 2017-07-26 ENCOUNTER — Ambulatory Visit
Admission: RE | Admit: 2017-07-26 | Discharge: 2017-07-26 | Disposition: A | Discharge status: Home / Self Care | Payer: Medicare Other | Source: Ambulatory Visit | Attending: Radiation Oncology | Admitting: Radiation Oncology

## 2017-07-26 DIAGNOSIS — Z510 Encounter for antineoplastic radiation therapy: Secondary | ICD-10-CM | POA: Diagnosis not present

## 2017-07-27 ENCOUNTER — Ambulatory Visit
Admission: RE | Admit: 2017-07-27 | Discharge: 2017-07-27 | Disposition: A | Discharge status: Home / Self Care | Payer: Medicare Other | Source: Ambulatory Visit | Attending: Radiation Oncology | Admitting: Radiation Oncology

## 2017-07-27 DIAGNOSIS — Z510 Encounter for antineoplastic radiation therapy: Secondary | ICD-10-CM | POA: Diagnosis not present

## 2017-07-28 ENCOUNTER — Ambulatory Visit
Admission: RE | Admit: 2017-07-28 | Discharge: 2017-07-28 | Disposition: A | Discharge status: Home / Self Care | Payer: Medicare Other | Source: Ambulatory Visit | Attending: Radiation Oncology | Admitting: Radiation Oncology

## 2017-07-28 DIAGNOSIS — Z510 Encounter for antineoplastic radiation therapy: Secondary | ICD-10-CM | POA: Diagnosis not present

## 2017-07-31 ENCOUNTER — Inpatient Hospital Stay: Payer: Medicare Other | Attending: Oncology

## 2017-07-31 ENCOUNTER — Ambulatory Visit: Payer: Medicare Other

## 2017-07-31 ENCOUNTER — Ambulatory Visit
Admission: RE | Admit: 2017-07-31 | Discharge: 2017-07-31 | Disposition: A | Discharge status: Home / Self Care | Payer: Medicare Other | Source: Ambulatory Visit | Attending: Radiation Oncology | Admitting: Radiation Oncology

## 2017-07-31 ENCOUNTER — Inpatient Hospital Stay: Payer: Medicare Other

## 2017-07-31 DIAGNOSIS — Z5112 Encounter for antineoplastic immunotherapy: Secondary | ICD-10-CM | POA: Diagnosis present

## 2017-07-31 DIAGNOSIS — C50412 Malignant neoplasm of upper-outer quadrant of left female breast: Secondary | ICD-10-CM | POA: Diagnosis not present

## 2017-07-31 DIAGNOSIS — Z79899 Other long term (current) drug therapy: Secondary | ICD-10-CM | POA: Insufficient documentation

## 2017-07-31 DIAGNOSIS — E0590 Thyrotoxicosis, unspecified without thyrotoxic crisis or storm: Secondary | ICD-10-CM | POA: Insufficient documentation

## 2017-07-31 DIAGNOSIS — F419 Anxiety disorder, unspecified: Secondary | ICD-10-CM | POA: Insufficient documentation

## 2017-07-31 DIAGNOSIS — E876 Hypokalemia: Secondary | ICD-10-CM | POA: Diagnosis not present

## 2017-07-31 DIAGNOSIS — Z923 Personal history of irradiation: Secondary | ICD-10-CM | POA: Insufficient documentation

## 2017-07-31 DIAGNOSIS — Z510 Encounter for antineoplastic radiation therapy: Secondary | ICD-10-CM | POA: Insufficient documentation

## 2017-07-31 DIAGNOSIS — Z170 Estrogen receptor positive status [ER+]: Secondary | ICD-10-CM | POA: Insufficient documentation

## 2017-07-31 DIAGNOSIS — C50919 Malignant neoplasm of unspecified site of unspecified female breast: Secondary | ICD-10-CM

## 2017-07-31 LAB — COMPREHENSIVE METABOLIC PANEL
ALK PHOS: 79 U/L (ref 38–126)
ALT: 17 U/L (ref 0–44)
ANION GAP: 11 (ref 5–15)
AST: 19 U/L (ref 15–41)
Albumin: 3.9 g/dL (ref 3.5–5.0)
BUN: 22 mg/dL — ABNORMAL HIGH (ref 6–20)
CALCIUM: 9.6 mg/dL (ref 8.9–10.3)
CO2: 19 mmol/L — ABNORMAL LOW (ref 22–32)
Chloride: 109 mmol/L (ref 98–111)
Creatinine, Ser: 1.01 mg/dL — ABNORMAL HIGH (ref 0.44–1.00)
GFR calc non Af Amer: >60 mL/min (ref >60)
Glucose, Bld: 91 mg/dL (ref 70–99)
Potassium: 3.8 mmol/L (ref 3.5–5.1)
SODIUM: 139 mmol/L (ref 135–145)
Total Bilirubin: 0.4 mg/dL (ref 0.3–1.2)
Total Protein: 7.3 g/dL (ref 6.5–8.1)

## 2017-07-31 LAB — CBC WITH DIFFERENTIAL/PLATELET
BASOS ABS: 0.1 10*3/uL (ref 0–0.1)
BASOS PCT: 1 %
Eosinophils Absolute: 0.3 10*3/uL (ref 0–0.7)
Eosinophils Relative: 3 %
HEMATOCRIT: 38 % (ref 35.0–47.0)
HEMOGLOBIN: 13.3 g/dL (ref 12.0–16.0)
Lymphocytes Relative: 14 %
Lymphs Abs: 1.1 10*3/uL (ref 1.0–3.6)
MCH: 35.1 pg — ABNORMAL HIGH (ref 26.0–34.0)
MCHC: 35 g/dL (ref 32.0–36.0)
MCV: 100.3 fL — ABNORMAL HIGH (ref 80.0–100.0)
Monocytes Absolute: 0.5 10*3/uL (ref 0.2–0.9)
Monocytes Relative: 6 %
NEUTROS ABS: 6.1 10*3/uL (ref 1.4–6.5)
NEUTROS PCT: 76 %
Platelets: 276 10*3/uL (ref 150–440)
RBC: 3.79 MIL/uL — AB (ref 3.80–5.20)
RDW: 13.9 % (ref 11.5–14.5)
WBC: 8.1 10*3/uL (ref 3.6–11.0)

## 2017-07-31 MED ORDER — ACETAMINOPHEN 325 MG PO TABS
650.0000 mg | ORAL_TABLET | Freq: Once | ORAL | Status: AC
  Administered 2017-07-31: 650 mg via ORAL
  Filled 2017-07-31: qty 2

## 2017-07-31 MED ORDER — HEPARIN SOD (PORK) LOCK FLUSH 100 UNIT/ML IV SOLN
500.0000 [IU] | Freq: Once | INTRAVENOUS | Status: AC
  Administered 2017-07-31: 500 [IU] via INTRAVENOUS
  Filled 2017-07-31: qty 5

## 2017-07-31 MED ORDER — SODIUM CHLORIDE 0.9% FLUSH
10.0000 mL | Freq: Once | INTRAVENOUS | Status: AC
  Administered 2017-07-31: 10 mL via INTRAVENOUS
  Filled 2017-07-31: qty 10

## 2017-07-31 MED ORDER — DIPHENHYDRAMINE HCL 25 MG PO CAPS
25.0000 mg | ORAL_CAPSULE | Freq: Once | ORAL | Status: AC
  Administered 2017-07-31: 25 mg via ORAL
  Filled 2017-07-31: qty 1

## 2017-07-31 MED ORDER — SODIUM CHLORIDE 0.9 % IV SOLN
Freq: Once | INTRAVENOUS | Status: AC
  Administered 2017-07-31: 14:00:00 via INTRAVENOUS
  Filled 2017-07-31: qty 1000

## 2017-07-31 MED ORDER — TRASTUZUMAB CHEMO 150 MG IV SOLR
6.0000 mg/kg | Freq: Once | INTRAVENOUS | Status: AC
  Administered 2017-07-31: 357 mg via INTRAVENOUS
  Filled 2017-07-31: qty 17

## 2017-07-31 MED ORDER — HEPARIN SOD (PORK) LOCK FLUSH 100 UNIT/ML IV SOLN: 500.0000 [IU] | Freq: Once | INTRAVENOUS | Status: DC | PRN

## 2017-07-31 NOTE — Progress Notes (Signed)
Pt with multiple spider bites to buttocks, states it is "itching and hurts sometimes", per Dr Baruch Gouty has been using Hydrocortisone cream and Benadryl cream with no relief. Assessed area and it appears to be healing well, Dr Grayland Ormond notified of pts request for him to assess prior to completing tx today.

## 2017-08-01 ENCOUNTER — Ambulatory Visit
Admission: RE | Admit: 2017-08-01 | Discharge: 2017-08-01 | Disposition: A | Discharge status: Home / Self Care | Payer: Medicare Other | Source: Ambulatory Visit | Attending: Radiation Oncology | Admitting: Radiation Oncology

## 2017-08-01 ENCOUNTER — Ambulatory Visit: Payer: Medicare Other

## 2017-08-01 DIAGNOSIS — C50412 Malignant neoplasm of upper-outer quadrant of left female breast: Secondary | ICD-10-CM | POA: Diagnosis not present

## 2017-08-02 ENCOUNTER — Ambulatory Visit
Admission: RE | Admit: 2017-08-02 | Discharge: 2017-08-02 | Disposition: A | Discharge status: Home / Self Care | Payer: Medicare Other | Source: Ambulatory Visit | Attending: Radiation Oncology | Admitting: Radiation Oncology

## 2017-08-02 DIAGNOSIS — C50412 Malignant neoplasm of upper-outer quadrant of left female breast: Secondary | ICD-10-CM | POA: Diagnosis not present

## 2017-08-04 ENCOUNTER — Ambulatory Visit: Payer: Medicare Other

## 2017-08-04 ENCOUNTER — Ambulatory Visit
Admission: RE | Admit: 2017-08-04 | Discharge: 2017-08-04 | Disposition: A | Discharge status: Home / Self Care | Payer: Medicare Other | Source: Ambulatory Visit | Attending: Radiation Oncology | Admitting: Radiation Oncology

## 2017-08-04 DIAGNOSIS — C50412 Malignant neoplasm of upper-outer quadrant of left female breast: Secondary | ICD-10-CM | POA: Diagnosis not present

## 2017-08-07 ENCOUNTER — Ambulatory Visit
Admission: RE | Admit: 2017-08-07 | Discharge: 2017-08-07 | Disposition: A | Discharge status: Home / Self Care | Payer: Medicare Other | Source: Ambulatory Visit | Attending: Radiation Oncology | Admitting: Radiation Oncology

## 2017-08-07 DIAGNOSIS — C50412 Malignant neoplasm of upper-outer quadrant of left female breast: Secondary | ICD-10-CM | POA: Diagnosis not present

## 2017-08-08 ENCOUNTER — Ambulatory Visit
Admission: RE | Admit: 2017-08-08 | Discharge: 2017-08-08 | Disposition: A | Discharge status: Home / Self Care | Payer: Medicare Other | Source: Ambulatory Visit | Attending: Radiation Oncology | Admitting: Radiation Oncology

## 2017-08-08 DIAGNOSIS — C50412 Malignant neoplasm of upper-outer quadrant of left female breast: Secondary | ICD-10-CM | POA: Diagnosis not present

## 2017-08-09 ENCOUNTER — Ambulatory Visit: Payer: Medicare Other

## 2017-08-09 ENCOUNTER — Ambulatory Visit
Admission: RE | Admit: 2017-08-09 | Discharge: 2017-08-09 | Disposition: A | Discharge status: Home / Self Care | Payer: Medicare Other | Source: Ambulatory Visit | Attending: Radiation Oncology | Admitting: Radiation Oncology

## 2017-08-09 DIAGNOSIS — C50412 Malignant neoplasm of upper-outer quadrant of left female breast: Secondary | ICD-10-CM | POA: Diagnosis not present

## 2017-08-10 ENCOUNTER — Ambulatory Visit: Payer: Medicare Other

## 2017-08-10 ENCOUNTER — Ambulatory Visit
Admission: RE | Admit: 2017-08-10 | Discharge: 2017-08-10 | Disposition: A | Discharge status: Home / Self Care | Payer: Medicare Other | Source: Ambulatory Visit | Attending: Radiation Oncology | Admitting: Radiation Oncology

## 2017-08-10 DIAGNOSIS — C50412 Malignant neoplasm of upper-outer quadrant of left female breast: Secondary | ICD-10-CM | POA: Diagnosis not present

## 2017-08-11 ENCOUNTER — Ambulatory Visit: Payer: Medicare Other

## 2017-08-11 ENCOUNTER — Ambulatory Visit
Admission: RE | Admit: 2017-08-11 | Discharge: 2017-08-11 | Disposition: A | Discharge status: Home / Self Care | Payer: Medicare Other | Source: Ambulatory Visit | Attending: Radiation Oncology | Admitting: Radiation Oncology

## 2017-08-11 DIAGNOSIS — C50412 Malignant neoplasm of upper-outer quadrant of left female breast: Secondary | ICD-10-CM | POA: Diagnosis not present

## 2017-08-14 ENCOUNTER — Ambulatory Visit: Payer: Medicare Other

## 2017-08-14 ENCOUNTER — Ambulatory Visit
Admission: RE | Admit: 2017-08-14 | Discharge: 2017-08-14 | Disposition: A | Discharge status: Home / Self Care | Payer: Medicare Other | Source: Ambulatory Visit | Attending: Radiation Oncology | Admitting: Radiation Oncology

## 2017-08-14 DIAGNOSIS — C50412 Malignant neoplasm of upper-outer quadrant of left female breast: Secondary | ICD-10-CM | POA: Diagnosis not present

## 2017-08-15 ENCOUNTER — Ambulatory Visit: Payer: Medicare Other

## 2017-08-21 ENCOUNTER — Inpatient Hospital Stay (HOSPITAL_BASED_OUTPATIENT_CLINIC_OR_DEPARTMENT_OTHER): Payer: Medicare Other | Admitting: Oncology

## 2017-08-21 ENCOUNTER — Other Ambulatory Visit: Payer: Self-pay

## 2017-08-21 ENCOUNTER — Inpatient Hospital Stay: Payer: Medicare Other

## 2017-08-21 ENCOUNTER — Encounter: Payer: Self-pay | Admitting: Oncology

## 2017-08-21 VITALS — BP 95/66 | HR 102 | Temp 98.0°F | Resp 12 | Ht 61.0 in | Wt 141.6 lb

## 2017-08-21 DIAGNOSIS — E876 Hypokalemia: Secondary | ICD-10-CM | POA: Diagnosis not present

## 2017-08-21 DIAGNOSIS — C50919 Malignant neoplasm of unspecified site of unspecified female breast: Secondary | ICD-10-CM

## 2017-08-21 DIAGNOSIS — E0590 Thyrotoxicosis, unspecified without thyrotoxic crisis or storm: Secondary | ICD-10-CM

## 2017-08-21 DIAGNOSIS — Z170 Estrogen receptor positive status [ER+]: Secondary | ICD-10-CM | POA: Diagnosis not present

## 2017-08-21 DIAGNOSIS — Z79899 Other long term (current) drug therapy: Secondary | ICD-10-CM

## 2017-08-21 DIAGNOSIS — Z5112 Encounter for antineoplastic immunotherapy: Secondary | ICD-10-CM | POA: Diagnosis not present

## 2017-08-21 DIAGNOSIS — C50412 Malignant neoplasm of upper-outer quadrant of left female breast: Secondary | ICD-10-CM | POA: Diagnosis not present

## 2017-08-21 DIAGNOSIS — F419 Anxiety disorder, unspecified: Secondary | ICD-10-CM

## 2017-08-21 DIAGNOSIS — Z923 Personal history of irradiation: Secondary | ICD-10-CM

## 2017-08-21 LAB — COMPREHENSIVE METABOLIC PANEL
ALT: 15 U/L (ref 0–44)
AST: 23 U/L (ref 15–41)
Albumin: 3.6 g/dL (ref 3.5–5.0)
Alkaline Phosphatase: 74 U/L (ref 38–126)
Anion gap: 8 (ref 5–15)
BUN: 19 mg/dL (ref 6–20)
CO2: 22 mmol/L (ref 22–32)
Calcium: 9.2 mg/dL (ref 8.9–10.3)
Chloride: 111 mmol/L (ref 98–111)
Creatinine, Ser: 1.05 mg/dL — ABNORMAL HIGH (ref 0.44–1.00)
GFR calc Af Amer: >60 mL/min (ref >60)
GFR calc non Af Amer: 60 mL/min — ABNORMAL LOW (ref >60)
Glucose, Bld: 173 mg/dL — ABNORMAL HIGH (ref 70–99)
Potassium: 3.4 mmol/L — ABNORMAL LOW (ref 3.5–5.1)
SODIUM: 141 mmol/L (ref 135–145)
Total Bilirubin: 0.4 mg/dL (ref 0.3–1.2)
Total Protein: 7 g/dL (ref 6.5–8.1)

## 2017-08-21 LAB — CBC WITH DIFFERENTIAL/PLATELET
BASOS ABS: 0.1 10*3/uL (ref 0–0.1)
BASOS PCT: 1 %
EOS ABS: 0.3 10*3/uL (ref 0–0.7)
Eosinophils Relative: 3 %
HEMATOCRIT: 38.6 % (ref 35.0–47.0)
HEMOGLOBIN: 13.1 g/dL (ref 12.0–16.0)
Lymphocytes Relative: 9 %
Lymphs Abs: 0.9 10*3/uL — ABNORMAL LOW (ref 1.0–3.6)
MCH: 33.9 pg (ref 26.0–34.0)
MCHC: 33.9 g/dL (ref 32.0–36.0)
MCV: 100 fL (ref 80.0–100.0)
Monocytes Absolute: 0.4 10*3/uL (ref 0.2–0.9)
Monocytes Relative: 4 %
NEUTROS ABS: 8.3 10*3/uL — AB (ref 1.4–6.5)
Neutrophils Relative %: 83 %
Platelets: 262 10*3/uL (ref 150–440)
RBC: 3.86 MIL/uL (ref 3.80–5.20)
RDW: 13.7 % (ref 11.5–14.5)
WBC: 10 10*3/uL (ref 3.6–11.0)

## 2017-08-21 MED ORDER — HEPARIN SOD (PORK) LOCK FLUSH 100 UNIT/ML IV SOLN
500.0000 [IU] | Freq: Once | INTRAVENOUS | Status: AC | PRN
  Administered 2017-08-21: 500 [IU]
  Filled 2017-08-21: qty 5

## 2017-08-21 MED ORDER — DIPHENHYDRAMINE HCL 25 MG PO CAPS
25.0000 mg | ORAL_CAPSULE | Freq: Once | ORAL | Status: AC
  Administered 2017-08-21: 25 mg via ORAL
  Filled 2017-08-21: qty 1

## 2017-08-21 MED ORDER — SODIUM CHLORIDE 0.9 % IV SOLN
Freq: Once | INTRAVENOUS | Status: AC
  Administered 2017-08-21: 12:00:00 via INTRAVENOUS
  Filled 2017-08-21: qty 1000

## 2017-08-21 MED ORDER — TRASTUZUMAB CHEMO 150 MG IV SOLR
6.0000 mg/kg | Freq: Once | INTRAVENOUS | Status: AC
  Administered 2017-08-21: 357 mg via INTRAVENOUS
  Filled 2017-08-21: qty 17

## 2017-08-21 MED ORDER — ACETAMINOPHEN 325 MG PO TABS
650.0000 mg | ORAL_TABLET | Freq: Once | ORAL | Status: AC
  Administered 2017-08-21: 650 mg via ORAL
  Filled 2017-08-21: qty 2

## 2017-08-21 MED ORDER — HEPARIN SOD (PORK) LOCK FLUSH 100 UNIT/ML IV SOLN: 500.0000 [IU] | Freq: Once | INTRAVENOUS | Status: DC

## 2017-08-21 MED ORDER — SODIUM CHLORIDE 0.9% FLUSH
10.0000 mL | INTRAVENOUS | Status: DC | PRN
  Administered 2017-08-21: 10 mL via INTRAVENOUS
  Filled 2017-08-21: qty 10

## 2017-08-21 MED ORDER — LETROZOLE 2.5 MG PO TABS: 2.5000 mg | ORAL_TABLET | Freq: Every day | ORAL | 3 refills | Status: DC

## 2017-08-21 NOTE — Progress Notes (Signed)
Patient here for follow up. No changes since last appointment.  

## 2017-08-21 NOTE — Progress Notes (Signed)
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  INTERVAL HISTORY: Patient returns to clinic today for further evaluation and consideration of cycle 7 of maintenance Herceptin.  She currently feels well and is asymptomatic.  The rash on her scalp has resolved.  She has no neurologic complaints.  She denies any recent fevers or illnesses.  She has a good appetite and denies weight loss.  She has no chest pain, cough, hemoptysis, or shortness of breath. She denies any nausea, vomiting, constipation, or diarrhea.  She has no urinary complaints.  Patient feels at her baseline offers no specific complaints today.  REVIEW OF SYSTEMS:   Review of Systems  Constitutional: Negative.  Negative for fever, malaise/fatigue and weight loss.  Respiratory: Negative.  Negative for cough and shortness of breath.   Cardiovascular: Negative.  Negative for chest pain and leg swelling.  Gastrointestinal: Negative.  Negative for abdominal pain.  Genitourinary: Negative.  Negative for dysuria.  Musculoskeletal: Negative.  Negative for joint pain.  Skin: Negative.  Negative for itching and rash.  Neurological: Negative.  Negative for sensory change, focal weakness, weakness and headaches.  Psychiatric/Behavioral: Negative.  The patient is not nervous/anxious and does not have insomnia.     As per HPI. Otherwise, a complete review of systems is negative.  PAST MEDICAL HISTORY: Past Medical History:  Diagnosis Date  . Abnormal Pap smear of cervix    01/2015 ascus/neg- 04/2015 ascus/neg  . Allergy   . Anxiety   . Arthritis   . Asthma   . Chronic kidney disease   . COPD (chronic obstructive pulmonary  disease) ([place])   . DDD (degenerative disc disease), lumbar   . Depression   . Elevated lipids   . Fibromyalgia   . Fibromyalgia   . Genetic testing 03/09/2017   Multi-Cancer panel (83 genes) @ [facility] - No pathogenic mutations detected  . GERD (gastroesophageal reflux disease)   . History of IBS   . Hyperthyroidism   . Joint disease   . Migraine   . Migraine   . Oxygen deficiency   . Restless leg syndrome   . Sleep apnea    Does not use C-PAP, cannot tolerate mask    PAST SURGICAL HISTORY: Past Surgical History:  Procedure Laterality Date  . ABDOMINAL HYSTERECTOMY  2008  . BREAST BIOPSY Left 02/02/2017   [place] core path pending  . BREAST EXCISIONAL BIOPSY Left 02/17/2017   lumpectomy with nl sn  . BREAST LUMPECTOMY WITH NEEDLE LOCALIZATION Left 02/17/2017   Procedure: BREAST LUMPECTOMY WITH NEEDLE LOCALIZATION;  Surgeon: Vickie Epley, MD;  Location: [facility];  Service: General;  Laterality: Left;  . CARPAL TUNNEL RELEASE Bilateral   . cryotherapy    . DILATION AND CURETTAGE OF UTERUS    . ENDOMETRIAL ABLATION    . LAPAROSCOPIC OOPHERECTOMY Left    unsure which side but thinks its the left  . LYSIS OF ADHESION Left 10/14/2015   Procedure: LYSIS OF ADHESION;  Surgeon: Thornton Park, MD;  Location: [facility];  Service: Orthopedics;  Laterality: Left;  . NECK SURGERY     lower neck fusion rods and screws  . PORTA CATH INSERTION N/A 03/08/2017   Procedure: PORTA CATH INSERTION;  Surgeon:  Schnier, Dolores Lory, MD;  Location: [facility];  Service: Cardiovascular;  Laterality: N/A;  . RESECTION DISTAL CLAVICAL Left 10/14/2015   Procedure: RESECTION DISTAL CLAVICAL;  Surgeon: Thornton Park, MD;  Location: [facility];  Service: Orthopedics;  Laterality: Left;  . SENTINEL NODE BIOPSY Left 02/17/2017   Procedure: SENTINEL NODE BIOPSY;  Surgeon: Vickie Epley, MD;  Location: [facility];  Service: General;  Laterality: Left;  . SHOULDER ARTHROSCOPY WITH OPEN ROTATOR CUFF REPAIR  Left 10/14/2015   Procedure: SHOULDER ARTHROSCOPY WITH OPEN ROTATOR CUFF REPAIR;  Surgeon: Thornton Park, MD;  Location: [facility];  Service: Orthopedics;  Laterality: Left;  . SHOULDER ARTHROSCOPY WITH OPEN ROTATOR CUFF REPAIR AND DISTAL CLAVICLE ACROMINECTOMY Right 09/03/2014   Procedure: RIGHT SHOULDER ARTHROSCOPY WITH MINI OPEN ROTATOR CUFF TEAR;  Surgeon: Thornton Park, MD;  Location: [facility];  Service: Orthopedics;  Laterality: Right;  biceps tenodesis, arthroscopic subacromial decompression and distal clavicle incision  . spinal injections    . SUBACROMIAL DECOMPRESSION Left 10/14/2015   Procedure: SUBACROMIAL DECOMPRESSION;  Surgeon: Thornton Park, MD;  Location: [facility];  Service: Orthopedics;  Laterality: Left;    FAMILY HISTORY: Family History  Problem Relation Age of Onset  . Breast cancer Mother 78       Glioblastoma also; deceased at 91  . Diabetes Father   . Lung cancer Father        mesothelioma; deceased 49s  . Cancer Maternal Aunt        "bone ca"; unk. primary  . Colon cancer Maternal Grandfather        dx in 32s; deceased 40  . Colon cancer Maternal Aunt        dx in 78s; currently 53s  . Lung cancer Maternal Grandmother        smoker; deceased 91  . Lung cancer Paternal Grandmother        smoker; deceased 65s  . Breast cancer Cousin        dx 73s; daughter of mat aunt with unk. primary cancer  . Cancer Other        distant cousin; unknown primary  . Colon cancer Other        dx 65s; currently 50; maternal half-sister  . Bipolar disorder Sister   . Schizophrenia Sister     ADVANCED DIRECTIVES (Y/N):  N  HEALTH MAINTENANCE: Social History   Tobacco Use  . Smoking status: Current Every Day Smoker    Packs/day: 1.00    Types: Cigarettes  . Smokeless tobacco: Never Used  Substance Use Topics  . Alcohol use: No    Alcohol/week: 0.0 oz  . Drug use: No     Colonoscopy:  PAP:  Bone density:  Lipid panel:  Allergies  Allergen Reactions  .  Iodine   . Bee Venom Hives and Rash  . Cefoxitin Rash  . Cefuroxime Axetil Hives and Rash  . Cucumber Extract Rash  . Gadolinium Derivatives Swelling, Other (See Comments) and Rash    NECK BECAME RED AND TONGUE WAS SWELLING SLIGHTLY PER PT NECK BECAME RED AND TONGUE WAS SWELLING SLIGHTLY PER PT NECK BECAME RED AND TONGUE WAS SWELLING SLIGHTLY PER PT  . Iodinated Diagnostic Agents Hives and Rash  . Latex Rash  . Other Hives and Rash    Bee Stings-swelling/hives/rash Wool (textile fiber)-Rash/itching  . Tomato Rash    Red tomatoes    Current Outpatient Medications  Medication Sig Dispense Refill  . albuterol (ACCUNEB) 0.63 MG/3ML nebulizer solution Take 1  ampule by nebulization every 6 (six) hours as needed for wheezing.    Marland Kitchen albuterol (PROAIR HFA) 108 (90 Base) MCG/ACT inhaler ProAir HFA 90 mcg/actuation aerosol inhaler    . baclofen (LIORESAL) 10 MG tablet Take 10 mg by mouth 3 (three) times daily as needed for muscle spasms (typically 2-3 times daily).     . cholecalciferol (VITAMIN D) 1000 UNITS tablet Take 1,000 Units by mouth 2 (two) times daily.     . clobetasol (OLUX) 0.05 % topical foam     . diclofenac sodium (VOLTAREN) 1 % GEL Apply 2 g topically 4 (four) times daily as needed (for pain.).     Marland Kitchen EPINEPHrine (EPIPEN JR) 0.15 MG/0.3ML injection Inject 0.15 mg into the muscle as needed for anaphylaxis.    . hydrocortisone 2.5 % cream Apply 1 application topically 2 (two) times daily as needed (for itchy/dry skin.).    Marland Kitchen lidocaine-prilocaine (EMLA) cream Apply to affected area once 30 g 3  . linaclotide (LINZESS) 145 MCG CAPS capsule Take 145 mcg by mouth daily as needed (for constipation.).    Marland Kitchen magnesium oxide (MAG-OX) 400 MG tablet Take 400 mg by mouth daily. Reported on 07/08/2015    . Multiple Vitamins-Minerals (HAIR SKIN AND NAILS FORMULA) TABS Take 2 tablets by mouth daily.     Marland Kitchen nystatin cream (MYCOSTATIN)     . NYSTATIN powder  APPLY TOPICALLY FOUR TIMES DAILY 15 g 1  .  omeprazole (PRILOSEC) 20 MG capsule Take 20 mg by mouth daily before breakfast.     . ondansetron (ZOFRAN) 8 MG tablet Take 1 tablet (8 mg total) by mouth 2 (two) times daily as needed (Nausea or vomiting). 30 tablet 2  . oxyCODONE 10 MG TABS Take 1 tablet (10 mg total) by mouth every 4 (four) hours as needed for breakthrough pain. 60 tablet 0  . prochlorperazine (COMPAZINE) 10 MG tablet Take 1 tablet (10 mg total) by mouth every 6 (six) hours as needed (Nausea or vomiting). 60 tablet 2  . promethazine (PHENERGAN) 25 MG tablet Take 25 mg by mouth every 6 (six) hours as needed for nausea or vomiting.    . propranolol (INDERAL) 10 MG tablet Take 1 tablet (10 mg total) by mouth 2 (two) times daily as needed. For severe anxiety sx 60 tablet 1  . rizatriptan (MAXALT-MLT) 10 MG disintegrating tablet Take 10 mg by mouth as needed for migraine. May repeat in 2 hours if needed    . rosuvastatin (CRESTOR) 40 MG tablet Take 40 mg by mouth at bedtime.     . silver sulfADIAZINE (SILVADENE) 1 % cream Apply 1 application topically 2 (two) times daily. 50 g 6  . temazepam (RESTORIL) 7.5 MG capsule Take 1 capsule (7.5 mg total) by mouth at bedtime as needed for sleep. 30 capsule 1  . topiramate (TOPAMAX) 100 MG tablet Take 200 mg by mouth daily.     Marland Kitchen triamcinolone ointment (KENALOG) 0.5 % Apply 1 application topically 2 (two) times daily. 30 g 0  . vitamin E (VITAMIN E) 200 UNIT capsule Take 200 Units by mouth 4 (four) times a week. Take 4-5 times a week    . letrozole (FEMARA) 2.5 MG tablet Take 1 tablet (2.5 mg total) by mouth daily. 30 tablet 3   No current facility-administered medications for this visit.    Facility-Administered Medications Ordered in Other Visits  Medication Dose Route Frequency Provider Last Rate Last Dose  . heparin lock flush 100 unit/mL  500 Units  Intravenous Once Lloyd Huger, MD      . sodium chloride flush (NS) 0.9 % injection 10 mL  10 mL Intravenous PRN Lloyd Huger,  MD   10 mL at 08/21/17 1057    OBJECTIVE: Vitals:   08/21/17 1102 08/21/17 1106  BP:  95/66  Pulse:  (!) 102  Resp: 12   Temp:  98 F (36.7 C)  SpO2:  99%     Body mass index is 26.76 kg/m.    ECOG FS:0 - Asymptomatic  General: Well-developed, well-nourished, no acute distress. Eyes: Pink conjunctiva, anicteric sclera. HEENT: Normocephalic, moist mucous membranes. Breast: Exam deferred today. Lungs: Clear to auscultation bilaterally. Heart: Regular rate and rhythm. No rubs, murmurs, or gallops. Abdomen: Soft, nontender, nondistended. No organomegaly noted, normoactive bowel sounds. Musculoskeletal: No edema, cyanosis, or clubbing. Neuro: Alert, answering all questions appropriately. Cranial nerves grossly intact. Skin: No rashes or petechiae noted. Psych: Normal affect.  LAB RESULTS:  Lab Results  Component Value Date   NA 141 08/21/2017   K 3.4 (L) 08/21/2017   CL 111 08/21/2017   CO2 22 08/21/2017   GLUCOSE 173 (H) 08/21/2017   BUN 19 08/21/2017   CREATININE 1.05 (H) 08/21/2017   CALCIUM 9.2 08/21/2017   PROT 7.0 08/21/2017   ALBUMIN 3.6 08/21/2017   AST 23 08/21/2017   ALT 15 08/21/2017   ALKPHOS 74 08/21/2017   BILITOT 0.4 08/21/2017   GFRNONAA 60 (L) 08/21/2017   GFRAA >60 08/21/2017    Lab Results  Component Value Date   WBC 10.0 08/21/2017   NEUTROABS 8.3 (H) 08/21/2017   HGB 13.1 08/21/2017   HCT 38.6 08/21/2017   MCV 100.0 08/21/2017   PLT 262 08/21/2017     STUDIES: No results found.  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  PLAN:    1. 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  MUGA scan on Jun 13, 2017 revealed an EF of 53.2% which is unchanged from previous.  Repeat prior to next Herceptin infusion.  Proceed with  cycle 7 of maintenance Herceptin today.  Patient has now completed her XRT and was given a prescription for letrozole which she will take for a total of 5 years completing in July 2024.  We will get baseline bone mineral density in the next 1 to 2 weeks.  Return to clinic in 3 weeks for Herceptin only and then in 6 weeks for further evaluation and consideration of cycle 9 of maintenance Herceptin.   2.  Genetic testing: Negative.  No further interventions are needed. 3.  Joint pain: Patient does not complain of this today.  Continue symptomatic treatment as directed. 4.  Anxiety: Continue evaluation and treatment per psychiatry. 5.  Chronic pain: Chronic.  Continue appointments with pain clinic as scheduled. 6.  Difficulty sleeping: Patient does not complain of this today.  Continue Ambien as needed. 7.  Rash: Resolved.   8.  Weakness and fatigue: Chronic and improving.  Patient was previously given a referral to the [facility] program.   9.  Hypokalemia: Mild, I have recommended dietary changes.  Monitor.  Patient expressed understanding and was in agreement with this plan. She also understands that She can call clinic at any time with any questions, concerns, or complaints.   Cancer Staging Malignant neoplasm of upper-outer quadrant  of left breast in female, estrogen receptor positive ([place]) Staging form: Breast, AJCC 8th Edition - Clinical stage from 02/07/2017: Stage IA (cT1c, cN0, cM0, G1, ER: Positive, PR: Positive, HER2: Positive) - Signed by Lloyd Huger, MD on 02/11/2017   Lloyd Huger, MD   08/22/2017 10:31 AM

## 2017-08-30 ENCOUNTER — Encounter
Admission: RE | Admit: 2017-08-30 | Discharge: 2017-08-30 | Disposition: A | Discharge status: Home / Self Care | Payer: Medicare Other | Source: Ambulatory Visit | Attending: Oncology | Admitting: Oncology

## 2017-08-30 DIAGNOSIS — C50412 Malignant neoplasm of upper-outer quadrant of left female breast: Secondary | ICD-10-CM | POA: Diagnosis present

## 2017-08-30 DIAGNOSIS — Z170 Estrogen receptor positive status [ER+]: Secondary | ICD-10-CM | POA: Diagnosis present

## 2017-08-30 MED ORDER — TECHNETIUM TC 99M-LABELED RED BLOOD CELLS IV KIT
20.0000 | PACK | Freq: Once | INTRAVENOUS | Status: AC | PRN
  Administered 2017-08-30: 22.08 via INTRAVENOUS

## 2017-09-10 NOTE — Progress Notes (Signed)
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  INTERVAL HISTORY: Patient returns to clinic today for further evaluation and consideration of cycle 8 of maintenance Herceptin.  She continues to feel well and remains asymptomatic. She has no neurologic complaints.  She denies any recent fevers or illnesses.  She has a good appetite and denies weight loss.  She has no chest pain, cough, hemoptysis, or shortness of breath. She denies any nausea, vomiting, constipation, or diarrhea.  She has no urinary complaints.  Patient offers no specific complaints today.  REVIEW OF SYSTEMS:   Review of Systems  Constitutional: Negative.  Negative for fever, malaise/fatigue and weight loss.  Respiratory: Negative.  Negative for cough and shortness of breath.   Cardiovascular: Negative.  Negative for chest pain and leg swelling.  Gastrointestinal: Negative.  Negative for abdominal pain.  Genitourinary: Negative.  Negative for dysuria.  Musculoskeletal: Negative.  Negative for joint pain.  Skin: Negative.  Negative for itching and rash.  Neurological: Negative.  Negative for sensory change, focal weakness, weakness and headaches.  Psychiatric/Behavioral: Negative.  The patient is not nervous/anxious and does not have insomnia.     As per HPI. Otherwise, a complete review of systems is negative.  PAST MEDICAL HISTORY: Past Medical History:  Diagnosis Date  . Abnormal Pap smear of cervix    01/2015 ascus/neg- 04/2015 ascus/neg  . Allergy   . Anxiety   . Arthritis   . Asthma   . Chronic kidney disease   . COPD (chronic obstructive pulmonary disease) ([place])   . DDD (degenerative disc disease),  lumbar   . Depression   . Elevated lipids   . Fibromyalgia   . Fibromyalgia   . Genetic testing 03/09/2017   Multi-Cancer panel (83 genes) @ [facility] - No pathogenic mutations detected  . GERD (gastroesophageal reflux disease)   . History of IBS   . Hyperthyroidism   . Joint disease   . Migraine   . Migraine   . Oxygen deficiency   . Restless leg syndrome   . Sleep apnea    Does not use C-PAP, cannot tolerate mask    PAST SURGICAL HISTORY: Past Surgical History:  Procedure Laterality Date  . ABDOMINAL HYSTERECTOMY  2008  . BREAST BIOPSY Left 02/02/2017   [place] core path pending  . BREAST EXCISIONAL BIOPSY Left 02/17/2017   lumpectomy with nl sn  . BREAST LUMPECTOMY WITH NEEDLE LOCALIZATION Left 02/17/2017   Procedure: BREAST LUMPECTOMY WITH NEEDLE LOCALIZATION;  Surgeon: Vickie Epley, MD;  Location: [facility];  Service: General;  Laterality: Left;  . CARPAL TUNNEL RELEASE Bilateral   . cryotherapy    . DILATION AND CURETTAGE OF UTERUS    . ENDOMETRIAL ABLATION    . LAPAROSCOPIC OOPHERECTOMY Left    unsure which side but thinks its the left  . LYSIS OF ADHESION Left 10/14/2015   Procedure: LYSIS OF ADHESION;  Surgeon: Thornton Park, MD;  Location: [facility];  Service: Orthopedics;  Laterality: Left;  . NECK SURGERY     lower neck fusion rods and screws  . OOPHORECTOMY     one ovary removed   . PORTA CATH INSERTION N/A 03/08/2017   Procedure: PORTA CATH INSERTION;  Surgeon: Delana Meyer,  Dolores Lory, MD;  Location: [place] [facility];  Service: Cardiovascular;  Laterality: N/A;  . RESECTION DISTAL CLAVICAL Left 10/14/2015   Procedure: RESECTION DISTAL CLAVICAL;  Surgeon: Thornton Park, MD;  Location: [facility];  Service: Orthopedics;  Laterality: Left;  . SENTINEL NODE BIOPSY Left 02/17/2017   Procedure: SENTINEL NODE BIOPSY;  Surgeon: Vickie Epley, MD;  Location: [facility];  Service: General;  Laterality: Left;  . SHOULDER ARTHROSCOPY WITH OPEN ROTATOR CUFF REPAIR Left  10/14/2015   Procedure: SHOULDER ARTHROSCOPY WITH OPEN ROTATOR CUFF REPAIR;  Surgeon: Thornton Park, MD;  Location: [facility];  Service: Orthopedics;  Laterality: Left;  . SHOULDER ARTHROSCOPY WITH OPEN ROTATOR CUFF REPAIR AND DISTAL CLAVICLE ACROMINECTOMY Right 09/03/2014   Procedure: RIGHT SHOULDER ARTHROSCOPY WITH MINI OPEN ROTATOR CUFF TEAR;  Surgeon: Thornton Park, MD;  Location: [facility];  Service: Orthopedics;  Laterality: Right;  biceps tenodesis, arthroscopic subacromial decompression and distal clavicle incision  . spinal injections    . SUBACROMIAL DECOMPRESSION Left 10/14/2015   Procedure: SUBACROMIAL DECOMPRESSION;  Surgeon: Thornton Park, MD;  Location: [facility];  Service: Orthopedics;  Laterality: Left;    FAMILY HISTORY: Family History  Problem Relation Age of Onset  . Breast cancer Mother 11       Glioblastoma also; deceased at 32  . Diabetes Father   . Lung cancer Father        mesothelioma; deceased 13s  . Cancer Maternal Aunt        "bone ca"; unk. primary  . Colon cancer Maternal Grandfather        dx in 66s; deceased 65  . Colon cancer Maternal Aunt        dx in 69s; currently 45s  . Lung cancer Maternal Grandmother        smoker; deceased 2  . Lung cancer Paternal Grandmother        smoker; deceased 3s  . Breast cancer Cousin        dx 30s; daughter of mat aunt with unk. primary cancer  . Cancer Other        distant cousin; unknown primary  . Colon cancer Other        dx 72s; currently 65; maternal half-sister  . Bipolar disorder Sister   . Schizophrenia Sister     ADVANCED DIRECTIVES (Y/N):  N  HEALTH MAINTENANCE: Social History   Tobacco Use  . Smoking status: Current Every Day Smoker    Packs/day: 1.00    Types: Cigarettes  . Smokeless tobacco: Never Used  Substance Use Topics  . Alcohol use: No    Alcohol/week: 0.0 standard drinks  . Drug use: No     Colonoscopy:  PAP:  Bone density:  Lipid panel:  Allergies  Allergen Reactions    . Iodine   . Bee Venom Hives and Rash  . Cefoxitin Rash  . Cefuroxime Axetil Hives and Rash  . Cucumber Extract Rash  . Gadolinium Derivatives Swelling, Other (See Comments) and Rash    NECK BECAME RED AND TONGUE WAS SWELLING SLIGHTLY PER PT NECK BECAME RED AND TONGUE WAS SWELLING SLIGHTLY PER PT NECK BECAME RED AND TONGUE WAS SWELLING SLIGHTLY PER PT  . Iodinated Diagnostic Agents Hives and Rash  . Latex Rash  . Other Hives and Rash    Bee Stings-swelling/hives/rash Wool (textile fiber)-Rash/itching  . Tomato Rash    Red tomatoes    Current Outpatient Medications  Medication Sig Dispense Refill  . albuterol (ACCUNEB) 0.63 MG/3ML nebulizer solution Take  1 ampule by nebulization every 6 (six) hours as needed for wheezing.    Marland Kitchen albuterol (PROAIR HFA) 108 (90 Base) MCG/ACT inhaler ProAir HFA 90 mcg/actuation aerosol inhaler    . baclofen (LIORESAL) 10 MG tablet Take 10 mg by mouth 3 (three) times daily as needed for muscle spasms (typically 2-3 times daily).     . cholecalciferol (VITAMIN D) 1000 UNITS tablet Take 1,000 Units by mouth 2 (two) times daily.     . clobetasol (OLUX) 0.05 % topical foam     . diclofenac sodium (VOLTAREN) 1 % GEL Apply 2 g topically 4 (four) times daily as needed (for pain.).     Marland Kitchen EPINEPHrine (EPIPEN JR) 0.15 MG/0.3ML injection Inject 0.15 mg into the muscle as needed for anaphylaxis.    . hydrocortisone 2.5 % cream Apply 1 application topically 2 (two) times daily as needed (for itchy/dry skin.).    Marland Kitchen letrozole (FEMARA) 2.5 MG tablet Take 1 tablet (2.5 mg total) by mouth daily. 30 tablet 3  . lidocaine-prilocaine (EMLA) cream Apply to affected area once 30 g 3  . linaclotide (LINZESS) 145 MCG CAPS capsule Take 145 mcg by mouth daily as needed (for constipation.).    Marland Kitchen magnesium oxide (MAG-OX) 400 MG tablet Take 400 mg by mouth daily. Reported on 07/08/2015    . Multiple Vitamins-Minerals (HAIR SKIN AND NAILS FORMULA) TABS Take 2 tablets by mouth daily.      Marland Kitchen nystatin cream (MYCOSTATIN)     . NYSTATIN powder  APPLY TOPICALLY FOUR TIMES DAILY 15 g 1  . omeprazole (PRILOSEC) 20 MG capsule Take 20 mg by mouth daily before breakfast.     . ondansetron (ZOFRAN) 8 MG tablet Take 1 tablet (8 mg total) by mouth 2 (two) times daily as needed (Nausea or vomiting). 30 tablet 2  . oxyCODONE 10 MG TABS Take 1 tablet (10 mg total) by mouth every 4 (four) hours as needed for breakthrough pain. 60 tablet 0  . prochlorperazine (COMPAZINE) 10 MG tablet Take 1 tablet (10 mg total) by mouth every 6 (six) hours as needed (Nausea or vomiting). 60 tablet 2  . propranolol (INDERAL) 10 MG tablet Take 1 tablet (10 mg total) by mouth 2 (two) times daily as needed. For severe anxiety sx 60 tablet 1  . rizatriptan (MAXALT-MLT) 10 MG disintegrating tablet Take 10 mg by mouth as needed for migraine. May repeat in 2 hours if needed    . rosuvastatin (CRESTOR) 40 MG tablet Take 40 mg by mouth at bedtime.     . silver sulfADIAZINE (SILVADENE) 1 % cream Apply 1 application topically 2 (two) times daily. 50 g 6  . temazepam (RESTORIL) 7.5 MG capsule Take 1 capsule (7.5 mg total) by mouth at bedtime as needed for sleep. 30 capsule 1  . topiramate (TOPAMAX) 100 MG tablet Take 200 mg by mouth daily.     Marland Kitchen triamcinolone ointment (KENALOG) 0.5 % Apply 1 application topically 2 (two) times daily. 30 g 0  . vitamin E (VITAMIN E) 200 UNIT capsule Take 200 Units by mouth 4 (four) times a week. Take 4-5 times a week    . promethazine (PHENERGAN) 25 MG tablet Take 25 mg by mouth every 6 (six) hours as needed for nausea or vomiting.     No current facility-administered medications for this visit.    Facility-Administered Medications Ordered in Other Visits  Medication Dose Route Frequency Provider Last Rate Last Dose  . heparin lock flush 100 unit/mL  500  Units Intravenous Once Lloyd Huger, MD      . sodium chloride flush (NS) 0.9 % injection 10 mL  10 mL Intravenous PRN Lloyd Huger, MD   10 mL at 08/21/17 1057    OBJECTIVE: Vitals:   09/11/17 1446  BP: 101/71  Pulse: 80  Resp: 20  Temp: 97.9 F (36.6 C)     Body mass index is 26.43 kg/m.    ECOG FS:0 - Asymptomatic  General: Well-developed, well-nourished, no acute distress. Eyes: Pink conjunctiva, anicteric sclera. HEENT: Normocephalic, moist mucous membranes. Breast: Exam deferred today. Lungs: Clear to auscultation bilaterally. Heart: Regular rate and rhythm. No rubs, murmurs, or gallops. Abdomen: Soft, nontender, nondistended. No organomegaly noted, normoactive bowel sounds. Musculoskeletal: No edema, cyanosis, or clubbing. Neuro: Alert, answering all questions appropriately. Cranial nerves grossly intact. Skin: No rashes or petechiae noted. Psych: Normal affect.  LAB RESULTS:  Lab Results  Component Value Date   NA 141 09/11/2017   K 3.8 09/11/2017   CL 109 09/11/2017   CO2 21 (L) 09/11/2017   GLUCOSE 84 09/11/2017   BUN 25 (H) 09/11/2017   CREATININE 1.04 (H) 09/11/2017   CALCIUM 9.5 09/11/2017   PROT 7.5 09/11/2017   ALBUMIN 4.0 09/11/2017   AST 22 09/11/2017   ALT 16 09/11/2017   ALKPHOS 80 09/11/2017   BILITOT 0.3 09/11/2017   GFRNONAA >60 09/11/2017   GFRAA >60 09/11/2017    Lab Results  Component Value Date   WBC 8.3 09/11/2017   NEUTROABS 6.2 09/11/2017   HGB 14.1 09/11/2017   HCT 40.8 09/11/2017   MCV 98.6 09/11/2017   PLT 261 09/11/2017     STUDIES: Nm Cardiac Muga Rest  Result Date: 08/30/2017 CLINICAL DATA:  Breast cancer. Evaluate cardiac function in relation to chemotherapy. EXAM: NUCLEAR MEDICINE CARDIAC BLOOD POOL IMAGING (MUGA) TECHNIQUE: Cardiac multi-gated acquisition was performed at rest following intravenous injection of Tc-31mlabeled red blood cells. RADIOPHARMACEUTICALS:  22.1 mCi Tc-962mertechnetate in-vitro labeled red blood cells IV COMPARISON:  06/13/2017 FINDINGS: No  focal wall motion abnormality of the left ventricle. Calculated left  ventricular ejection fraction equals 46%. Comparable to 53% on 06/13/2017 IMPRESSION: Left ventricular ejection fraction equals46%. Comparable to 53% on 06/13/2017 Electronically Signed   By: StSuzy Bouchard.D.   On: 08/30/2017 16:39   Dg Bone Density  Result Date: 09/12/2017 EXAM: DUAL X-RAY ABSORPTIOMETRY (DXA) FOR BONE MINERAL DENSITY IMPRESSION: Dear Dr FiGrayland OrmondYour patient LiSheron Nightingaleompleted a FRAX assessment on 09/12/2017 using the LuFinleyanalysis version: 14.10) manufactured by GEEM[employer]The following summarizes the results of our evaluation. PATIENT BIOGRAPHICAL: Name: CoJovita, Persingatient ID: [id number] Date: [birth year]eight:    63.0 in. Gender:     Female    [age]       Weight:    140.8 lbs. Ethnicity:  White                            Exam Date: 09/12/2017 FRAX* RESULTS:  (version: 3.5) 10-year Probability of Fracture1 Major Osteoporotic Fracture2 Hip Fracture 6.5% 1.2% Population: [place]) Risk Factors: Tobacco User (Current Smoker) Based on Femur (Left) Neck BMD 1 -The 10-year probability of fracture may be lower than reported if the patient has received treatment. 2 -Major Osteoporotic Fracture: Clinical Spine, Forearm, Hip or Shoulder *FRAX is a trMaterials engineerf the [facility]  [place] for Metabolic Bone Disease, a World Pharmacologist (WHO) [facility]. ASSESSMENT: The probability of a major osteoporotic fracture is 6.5% within the next ten years. The probability of a hip fracture is 1.2% within the next ten years. . Dear Dr Grayland Ormond, Your patient Bynum Bellows completed a BMD test on 09/12/2017 using the [place] (analysis version: 14.10) manufactured by [employer]. The following summarizes the results of our evaluation. PATIENT BIOGRAPHICAL: Name: Caela, Huot Patient ID: [id number] Birth Date: 03-01-1965 Height: 63.0 in. Gender: Female Exam Date: 09/12/2017 Weight: 140.8 lbs. Indications:  Breast CA, Caucasian, COPD, fibromyalgia, Height Loss, High Risk Meds, Oophorectomy Unilateral, Postmenopausal, Previous Chemo and Radiation, Tobacco User(Current Smoker), Tobacco User (Current Smoker) Fractures: Right foot Treatments: Femara, Multi-Vitamin, Omeprazole, topamax, Vitamin D ASSESSMENT: The BMD measured at Femur Neck Left is 0.788 g/cm2 with a T-score of -1.8. This patient is considered osteopenic according to [facility]) criteria. L1 and L2 were excluded due to degenerative changes. Site Region Measured Measured WHO Young Adult BMD Date       Age      Classification T-score AP Spine L3-L4 09/12/2017 52.6 Normal -0.6 1.140 g/cm2 DualFemur Neck Left 09/12/2017 52.6 Osteopenia -1.8 0.788 g/cm2 World Health Organization [facility]) criteria for post-menopausal, Caucasian Women: Normal:       T-score at or above -1 SD Osteopenia:   T-score between -1 and -2.5 SD Osteoporosis: T-score at or below -2.5 SD RECOMMENDATIONS: 1. All patients should optimize calcium and vitamin D intake. 2. Consider FDA-approved medical therapies in postmenopausal women and men aged 31 years and older, based on the following: a. A hip or vertebral(clinical or morphometric) fracture b. T-score < -2.5 at the femoral neck or spine after appropriate evaluation to exclude secondary causes c. Low bone mass (T-score between -1.0 and -2.5 at the femoral neck or spine) and a 10-year probability of a hip fracture > 3% or a 10-year probability of a major osteoporosis-related fracture > 20% based on the [place]-adapted WHO algorithm d. Clinician judgment and/or patient preferences may indicate treatment for people with 10-year fracture probabilities above or below these levels FOLLOW-UP: People with diagnosed cases of osteoporosis or at high risk for fracture should have regular bone mineral density tests. For patients eligible for Medicare, routine testing is allowed once every 2 years. The testing frequency can be increased to one  year for patients who have rapidly progressing disease, those who are receiving or discontinuing medical therapy to restore bone mass, or have additional risk factors. I have reviewed this report, and agree with the above findings. [facility] Electronically Signed   By: Lowella Grip III M.D.   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  PLAN:    1. 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  Patient's most recent MUGA scan on August 30, 2017 revealed an EF of 46% which is decreased from previous to 53%.  Because of this, will hold Herceptin infusion at this time.  Return to clinic in 4 weeks with repeat MUGA exam and consideration of reinitiating Herceptin which will be cycle 8.  Patient has now completed her XRT.  Continue letrozole for a total of 5 years completing in July 2024.     2.  Genetic testing: Negative.  No further interventions are needed. 3.  Joint pain: Patient does not complain of this today.  Continue symptomatic treatment as directed. 4.  Anxiety: Continue evaluation and treatment per psychiatry. 5.  Chronic pain: Chronic.  Continue appointments with pain clinic as scheduled. 6.  Difficulty sleeping: Patient does not complain of this today.  Continue Ambien as needed. 7.  Rash: Resolved.   8.  Weakness and fatigue: Chronic and improving.  Patient was previously given a referral to the CARE program.   9.  Hypokalemia: Resolved. 10.  Reduced ejection fraction: Hold Herceptin as above.  Repeat MUGA in 4 weeks.  If no resolution, consider cardiology referral. 11.  Osteopenia: Patient had a bone marrow density completed on September 12, 2017 which reported a T score of -1.8.  Continue calcium and vitamin D supplementation.  Repeat in  August 2020.  Patient expressed understanding and was in agreement with this plan. She also understands that She can call clinic at any time with any questions, concerns, or complaints.   Cancer Staging Malignant neoplasm of upper-outer quadrant of left breast in female, estrogen receptor positive ([place]) Staging form: Breast, AJCC 8th Edition - Clinical stage from 02/07/2017: Stage IA (cT1c, cN0, cM0, G1, ER: Positive, PR: Positive, HER2: Positive) - Signed by Lloyd Huger, MD on 02/11/2017   Lloyd Huger, MD   09/15/2017 2:17 PM

## 2017-09-11 ENCOUNTER — Encounter: Payer: Self-pay | Admitting: Oncology

## 2017-09-11 ENCOUNTER — Ambulatory Visit: Payer: Medicare Other

## 2017-09-11 ENCOUNTER — Inpatient Hospital Stay (HOSPITAL_BASED_OUTPATIENT_CLINIC_OR_DEPARTMENT_OTHER): Payer: Medicare Other | Admitting: Oncology

## 2017-09-11 ENCOUNTER — Inpatient Hospital Stay: Payer: Medicare Other | Attending: Oncology

## 2017-09-11 VITALS — BP 101/71 | HR 80 | Temp 97.9°F | Resp 20 | Wt 139.9 lb

## 2017-09-11 DIAGNOSIS — F329 Major depressive disorder, single episode, unspecified: Secondary | ICD-10-CM | POA: Diagnosis not present

## 2017-09-11 DIAGNOSIS — G4730 Sleep apnea, unspecified: Secondary | ICD-10-CM | POA: Insufficient documentation

## 2017-09-11 DIAGNOSIS — Z79899 Other long term (current) drug therapy: Secondary | ICD-10-CM | POA: Diagnosis not present

## 2017-09-11 DIAGNOSIS — Z923 Personal history of irradiation: Secondary | ICD-10-CM | POA: Diagnosis not present

## 2017-09-11 DIAGNOSIS — K219 Gastro-esophageal reflux disease without esophagitis: Secondary | ICD-10-CM | POA: Insufficient documentation

## 2017-09-11 DIAGNOSIS — Z79811 Long term (current) use of aromatase inhibitors: Secondary | ICD-10-CM | POA: Insufficient documentation

## 2017-09-11 DIAGNOSIS — Z170 Estrogen receptor positive status [ER+]: Secondary | ICD-10-CM

## 2017-09-11 DIAGNOSIS — F17210 Nicotine dependence, cigarettes, uncomplicated: Secondary | ICD-10-CM | POA: Diagnosis not present

## 2017-09-11 DIAGNOSIS — J449 Chronic obstructive pulmonary disease, unspecified: Secondary | ICD-10-CM | POA: Diagnosis not present

## 2017-09-11 DIAGNOSIS — F419 Anxiety disorder, unspecified: Secondary | ICD-10-CM

## 2017-09-11 DIAGNOSIS — M797 Fibromyalgia: Secondary | ICD-10-CM | POA: Insufficient documentation

## 2017-09-11 DIAGNOSIS — K589 Irritable bowel syndrome without diarrhea: Secondary | ICD-10-CM | POA: Diagnosis not present

## 2017-09-11 DIAGNOSIS — G2581 Restless legs syndrome: Secondary | ICD-10-CM | POA: Insufficient documentation

## 2017-09-11 DIAGNOSIS — C50412 Malignant neoplasm of upper-outer quadrant of left female breast: Secondary | ICD-10-CM

## 2017-09-11 DIAGNOSIS — M8580 Other specified disorders of bone density and structure, unspecified site: Secondary | ICD-10-CM | POA: Insufficient documentation

## 2017-09-11 DIAGNOSIS — C50919 Malignant neoplasm of unspecified site of unspecified female breast: Secondary | ICD-10-CM

## 2017-09-11 LAB — CBC WITH DIFFERENTIAL/PLATELET
Basophils Absolute: 0.1 10*3/uL (ref 0–0.1)
Basophils Relative: 1 %
EOS ABS: 0.2 10*3/uL (ref 0–0.7)
Eosinophils Relative: 3 %
HCT: 40.8 % (ref 35.0–47.0)
Hemoglobin: 14.1 g/dL (ref 12.0–16.0)
Lymphocytes Relative: 17 %
Lymphs Abs: 1.4 10*3/uL (ref 1.0–3.6)
MCH: 34.1 pg — ABNORMAL HIGH (ref 26.0–34.0)
MCHC: 34.6 g/dL (ref 32.0–36.0)
MCV: 98.6 fL (ref 80.0–100.0)
MONO ABS: 0.4 10*3/uL (ref 0.2–0.9)
MONOS PCT: 5 %
Neutro Abs: 6.2 10*3/uL (ref 1.4–6.5)
Neutrophils Relative %: 74 %
PLATELETS: 261 10*3/uL (ref 150–440)
RBC: 4.14 MIL/uL (ref 3.80–5.20)
RDW: 13.7 % (ref 11.5–14.5)
WBC: 8.3 10*3/uL (ref 3.6–11.0)

## 2017-09-11 LAB — COMPREHENSIVE METABOLIC PANEL
ALK PHOS: 80 U/L (ref 38–126)
ALT: 16 U/L (ref 0–44)
AST: 22 U/L (ref 15–41)
Albumin: 4 g/dL (ref 3.5–5.0)
Anion gap: 11 (ref 5–15)
BUN: 25 mg/dL — ABNORMAL HIGH (ref 6–20)
CALCIUM: 9.5 mg/dL (ref 8.9–10.3)
CO2: 21 mmol/L — AB (ref 22–32)
CREATININE: 1.04 mg/dL — AB (ref 0.44–1.00)
Chloride: 109 mmol/L (ref 98–111)
GFR calc non Af Amer: >60 mL/min (ref >60)
Glucose, Bld: 84 mg/dL (ref 70–99)
Potassium: 3.8 mmol/L (ref 3.5–5.1)
Sodium: 141 mmol/L (ref 135–145)
Total Bilirubin: 0.3 mg/dL (ref 0.3–1.2)
Total Protein: 7.5 g/dL (ref 6.5–8.1)

## 2017-09-11 NOTE — Progress Notes (Signed)
Patient reports hot flashes and generalized pain.

## 2017-09-12 ENCOUNTER — Ambulatory Visit
Admission: RE | Admit: 2017-09-12 | Discharge: 2017-09-12 | Disposition: A | Discharge status: Home / Self Care | Payer: Medicare Other | Source: Ambulatory Visit | Attending: Oncology | Admitting: Oncology

## 2017-09-12 ENCOUNTER — Encounter: Payer: Self-pay | Admitting: Radiology

## 2017-09-12 DIAGNOSIS — Z780 Asymptomatic menopausal state: Secondary | ICD-10-CM | POA: Insufficient documentation

## 2017-09-12 DIAGNOSIS — Z170 Estrogen receptor positive status [ER+]: Secondary | ICD-10-CM | POA: Insufficient documentation

## 2017-09-12 DIAGNOSIS — C50412 Malignant neoplasm of upper-outer quadrant of left female breast: Secondary | ICD-10-CM | POA: Diagnosis not present

## 2017-09-12 DIAGNOSIS — Z79811 Long term (current) use of aromatase inhibitors: Secondary | ICD-10-CM | POA: Diagnosis not present

## 2017-09-18 ENCOUNTER — Ambulatory Visit: Payer: Medicare Other | Admitting: Radiation Oncology

## 2017-10-03 ENCOUNTER — Ambulatory Visit: Payer: Medicare Other

## 2017-10-03 ENCOUNTER — Other Ambulatory Visit: Payer: Medicare Other

## 2017-10-03 ENCOUNTER — Ambulatory Visit: Payer: Medicare Other | Admitting: Oncology

## 2017-10-04 ENCOUNTER — Encounter: Payer: Self-pay | Admitting: Radiation Oncology

## 2017-10-04 ENCOUNTER — Ambulatory Visit
Admission: RE | Admit: 2017-10-04 | Discharge: 2017-10-04 | Disposition: A | Discharge status: Home / Self Care | Payer: Medicare Other | Source: Ambulatory Visit | Attending: Radiation Oncology | Admitting: Radiation Oncology

## 2017-10-04 ENCOUNTER — Ambulatory Visit
Admission: RE | Admit: 2017-10-04 | Discharge: 2017-10-04 | Disposition: A | Discharge status: Home / Self Care | Payer: Medicare Other | Source: Ambulatory Visit | Attending: Oncology | Admitting: Oncology

## 2017-10-04 ENCOUNTER — Other Ambulatory Visit: Payer: Self-pay

## 2017-10-04 VITALS — BP 107/79 | HR 92 | Temp 98.0°F | Resp 16 | Wt 142.0 lb

## 2017-10-04 DIAGNOSIS — C50412 Malignant neoplasm of upper-outer quadrant of left female breast: Secondary | ICD-10-CM | POA: Diagnosis present

## 2017-10-04 DIAGNOSIS — Z170 Estrogen receptor positive status [ER+]: Secondary | ICD-10-CM | POA: Diagnosis not present

## 2017-10-04 MED ORDER — TECHNETIUM TC 99M-LABELED RED BLOOD CELLS IV KIT
20.0000 | PACK | Freq: Once | INTRAVENOUS | Status: AC | PRN
  Administered 2017-10-04: 25.7 via INTRAVENOUS

## 2017-10-04 NOTE — Progress Notes (Signed)
Radiation Oncology Follow up Note  Name: DANIEL JOHNDROW   Date:   10/04/2017 MRN:  [id number] DOB: 1965/04/02    This 52 y.o. female presents to the clinic today for one-month follow-up status post whole breast radiation to her left breast for stage IA triple positive invasive mammary carcinoma.  REFERRING PROVIDER: [name], MD  HPI: patient is a 52 year old female now 1 month out having completed whole breast radiation to her left breast for stage IA triple positive invasive mammary carcinoma. Seen today in routine follow-up she is doing well..she specifically denies breast tenderness cough or bone pain. She is currently on cycle 8 of maintenance Herceptin.she has started tomorrow which is giving her some increased joint pain.  COMPLICATIONS OF TREATMENT: none  FOLLOW UP COMPLIANCE: keeps appointments   PHYSICAL EXAM:  BP 107/79 (BP Location: Left Arm, Patient Position: Sitting)   Pulse 92   Temp 98 F (36.7 C) (Tympanic)   Resp 16   Wt 141 lb 15.6 oz (64.4 kg)   LMP 08/24/2008   BMI 26.83 kg/m  Lungs are clear to A&P cardiac examination essentially unremarkable with regular rate and rhythm. No dominant mass or nodularity is noted in either breast in 2 positions examined. Incision is well-healed. No axillary or supraclavicular adenopathy is appreciated. Cosmetic result is excellent. Well-developed well-nourished patient in NAD. HEENT reveals PERLA, EOMI, discs not visualized.  Oral cavity is clear. No oral mucosal lesions are identified. Neck is clear without evidence of cervical or supraclavicular adenopathy. Lungs are clear to A&P. Cardiac examination is essentially unremarkable with regular rate and rhythm without murmur rub or thrill. Abdomen is benign with no organomegaly or masses noted. Motor sensory and DTR levels are equal and symmetric in the upper and lower extremities. Cranial nerves II through XII are grossly intact. Proprioception is intact. No peripheral adenopathy or  edema is identified. No motor or sensory levels are noted. Crude visual fields are within normal range.  RADIOLOGY RESULTS: no current films for review  PLAN: present time patient is recovering well from her radiation therapy treatments. I'm please were overall progress. She continues on maintenance Herceptin and Femara. I have asked to see her back in 4-5 months for follow-up. Patient knows to call with any concerns.  I would like to take this opportunity to thank you for allowing me to participate in the care of your patient.Noreene Filbert, MD

## 2017-10-08 NOTE — Progress Notes (Signed)
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  INTERVAL HISTORY: Patient returns to clinic today for further evaluation and discussion of her MUGA results.  She was recently started on letrozole as well and states she has persistent hot flashes and joint pain.  She has no neurologic complaints.  She denies any recent fevers or illnesses.  She has a good appetite and denies weight loss.  She has no chest pain, cough, hemoptysis, or shortness of breath. She denies any nausea, vomiting, constipation, or diarrhea.  She has no urinary complaints.  Patient offers no further specific complaints today.  REVIEW OF SYSTEMS:   Review of Systems  Constitutional: Negative.  Negative for fever, malaise/fatigue and weight loss.  Respiratory: Negative.  Negative for cough and shortness of breath.   Cardiovascular: Negative.  Negative for chest pain and leg swelling.  Gastrointestinal: Negative.  Negative for abdominal pain.  Genitourinary: Negative.  Negative for dysuria.  Musculoskeletal: Positive for joint pain.  Skin: Negative.  Negative for itching and rash.  Neurological: Positive for sensory change. Negative for focal weakness, weakness and headaches.  Psychiatric/Behavioral: Negative.  The patient is not nervous/anxious and does not have insomnia.     As per HPI. Otherwise, a complete review of systems is negative.  PAST MEDICAL HISTORY: Past Medical History:  Diagnosis Date  . Abnormal Pap smear of cervix    01/2015 ascus/neg- 04/2015 ascus/neg  . Allergy   . Anxiety   . Arthritis   . Asthma   . Chronic kidney disease   .  COPD (chronic obstructive pulmonary disease) ([place])   . DDD (degenerative disc disease), lumbar   . Depression   . Elevated lipids   . Fibromyalgia   . Fibromyalgia   . Genetic testing 03/09/2017   Multi-Cancer panel (83 genes) @ [facility] - No pathogenic mutations detected  . GERD (gastroesophageal reflux disease)   . History of IBS   . Hyperthyroidism   . Joint disease   . Migraine   . Migraine   . Oxygen deficiency   . Restless leg syndrome   . Sleep apnea    Does not use C-PAP, cannot tolerate mask    PAST SURGICAL HISTORY: Past Surgical History:  Procedure Laterality Date  . ABDOMINAL HYSTERECTOMY  2008  . BREAST BIOPSY Left 02/02/2017   [place] core path pending  . BREAST EXCISIONAL BIOPSY Left 02/17/2017   lumpectomy with nl sn  . BREAST LUMPECTOMY WITH NEEDLE LOCALIZATION Left 02/17/2017   Procedure: BREAST LUMPECTOMY WITH NEEDLE LOCALIZATION;  Surgeon: Vickie Epley, MD;  Location: [facility];  Service: General;  Laterality: Left;  . CARPAL TUNNEL RELEASE Bilateral   . cryotherapy    . DILATION AND CURETTAGE OF UTERUS    . ENDOMETRIAL ABLATION    . LAPAROSCOPIC OOPHERECTOMY Left    unsure which side but thinks its the left  . LYSIS OF ADHESION Left 10/14/2015   Procedure: LYSIS OF ADHESION;  Surgeon: Thornton Park, MD;  Location: [facility];  Service: Orthopedics;  Laterality: Left;  . NECK SURGERY     lower neck fusion rods and screws  . OOPHORECTOMY     one ovary removed   .  PORTA CATH INSERTION N/A 03/08/2017   Procedure: PORTA CATH INSERTION;  Surgeon: Katha Cabal, MD;  Location: [facility];  Service: Cardiovascular;  Laterality: N/A;  . RESECTION DISTAL CLAVICAL Left 10/14/2015   Procedure: RESECTION DISTAL CLAVICAL;  Surgeon: Thornton Park, MD;  Location: [facility];  Service: Orthopedics;  Laterality: Left;  . SENTINEL NODE BIOPSY Left 02/17/2017   Procedure: SENTINEL NODE BIOPSY;  Surgeon: Vickie Epley, MD;  Location: [facility];  Service:  General;  Laterality: Left;  . SHOULDER ARTHROSCOPY WITH OPEN ROTATOR CUFF REPAIR Left 10/14/2015   Procedure: SHOULDER ARTHROSCOPY WITH OPEN ROTATOR CUFF REPAIR;  Surgeon: Thornton Park, MD;  Location: [facility];  Service: Orthopedics;  Laterality: Left;  . SHOULDER ARTHROSCOPY WITH OPEN ROTATOR CUFF REPAIR AND DISTAL CLAVICLE ACROMINECTOMY Right 09/03/2014   Procedure: RIGHT SHOULDER ARTHROSCOPY WITH MINI OPEN ROTATOR CUFF TEAR;  Surgeon: Thornton Park, MD;  Location: [facility];  Service: Orthopedics;  Laterality: Right;  biceps tenodesis, arthroscopic subacromial decompression and distal clavicle incision  . spinal injections    . SUBACROMIAL DECOMPRESSION Left 10/14/2015   Procedure: SUBACROMIAL DECOMPRESSION;  Surgeon: Thornton Park, MD;  Location: [facility];  Service: Orthopedics;  Laterality: Left;    FAMILY HISTORY: Family History  Problem Relation Age of Onset  . Breast cancer Mother 50       Glioblastoma also; deceased at 66  . Diabetes Father   . Lung cancer Father        mesothelioma; deceased 30s  . Cancer Maternal Aunt        "bone ca"; unk. primary  . Colon cancer Maternal Grandfather        dx in 64s; deceased 6  . Colon cancer Maternal Aunt        dx in 67s; currently 6s  . Lung cancer Maternal Grandmother        smoker; deceased 62  . Lung cancer Paternal Grandmother        smoker; deceased 68s  . Breast cancer Cousin        dx 104s; daughter of mat aunt with unk. primary cancer  . Cancer Other        distant cousin; unknown primary  . Colon cancer Other        dx 62s; currently 34; maternal half-sister  . Bipolar disorder Sister   . Schizophrenia Sister     ADVANCED DIRECTIVES (Y/N):  N  HEALTH MAINTENANCE: Social History   Tobacco Use  . Smoking status: Current Every Day Smoker    Packs/day: 1.00    Types: Cigarettes  . Smokeless tobacco: Never Used  Substance Use Topics  . Alcohol use: No    Alcohol/week: 0.0 standard drinks  . Drug use: No      Colonoscopy:  PAP:  Bone density:  Lipid panel:  Allergies  Allergen Reactions  . Iodine   . Bee Venom Hives and Rash  . Cefoxitin Rash  . Cefuroxime Axetil Hives and Rash  . Cucumber Extract Rash  . Gadolinium Derivatives Swelling, Other (See Comments) and Rash    NECK BECAME RED AND TONGUE WAS SWELLING SLIGHTLY PER PT NECK BECAME RED AND TONGUE WAS SWELLING SLIGHTLY PER PT NECK BECAME RED AND TONGUE WAS SWELLING SLIGHTLY PER PT  . Iodinated Diagnostic Agents Hives and Rash  . Latex Rash  . Other Hives and Rash    Bee Stings-swelling/hives/rash Wool (textile fiber)-Rash/itching  . Tomato Rash    Red tomatoes    Current Outpatient Medications  Medication Sig Dispense Refill  . baclofen (LIORESAL) 10 MG tablet Take 10 mg by mouth 3 (three) times daily as needed for muscle spasms (typically 2-3 times daily).     . cholecalciferol (VITAMIN D) 1000 UNITS tablet Take 1,000 Units by mouth 2 (two) times daily.     . clobetasol (OLUX) 0.05 % topical foam     . diclofenac sodium (VOLTAREN) 1 % GEL Apply 2 g topically 4 (four) times daily as needed (for pain.).     Marland Kitchen EPINEPHrine (EPIPEN JR) 0.15 MG/0.3ML injection Inject 0.15 mg into the muscle as needed for anaphylaxis.    . hydrocortisone 2.5 % cream Apply 1 application topically 2 (two) times daily as needed (for itchy/dry skin.).    Marland Kitchen letrozole (FEMARA) 2.5 MG tablet Take 1 tablet (2.5 mg total) by mouth daily. 30 tablet 3  . lidocaine-prilocaine (EMLA) cream Apply to affected area once 30 g 3  . linaclotide (LINZESS) 145 MCG CAPS capsule Take 145 mcg by mouth daily as needed (for constipation.).    Marland Kitchen magnesium oxide (MAG-OX) 400 MG tablet Take 400 mg by mouth daily. Reported on 07/08/2015    . oxyCODONE 10 MG TABS Take 1 tablet (10 mg total) by mouth every 4 (four) hours as needed for breakthrough pain. 60 tablet 0  . promethazine (PHENERGAN) 25 MG tablet Take 25 mg by mouth every 6 (six) hours as needed for nausea or vomiting.     . rizatriptan (MAXALT-MLT) 10 MG disintegrating tablet Take 10 mg by mouth as needed for migraine. May repeat in 2 hours if needed    . rosuvastatin (CRESTOR) 40 MG tablet Take 40 mg by mouth at bedtime.     . topiramate (TOPAMAX) 100 MG tablet Take 200 mg by mouth daily.     . vitamin E (VITAMIN E) 200 UNIT capsule Take 200 Units by mouth 4 (four) times a week. Take 4-5 times a week     No current facility-administered medications for this visit.    Facility-Administered Medications Ordered in Other Visits  Medication Dose Route Frequency Provider Last Rate Last Dose  . heparin lock flush 100 unit/mL  500 Units Intravenous Once Lloyd Huger, MD      . sodium chloride flush (NS) 0.9 % injection 10 mL  10 mL Intravenous PRN Lloyd Huger, MD   10 mL at 08/21/17 1057    OBJECTIVE: Vitals:   10/09/17 1100  BP: 103/73  Pulse: 91  Resp: 18  Temp: (!) 97.3 F (36.3 C)     Body mass index is 27.26 kg/m.    ECOG FS:0 - Asymptomatic  General: Well-developed, well-nourished, no acute distress. Eyes: Pink conjunctiva, anicteric sclera. HEENT: Normocephalic, moist mucous membranes. Breast: Exam deferred today. Lungs: Clear to auscultation bilaterally. Heart: Regular rate and rhythm. No rubs, murmurs, or gallops. Abdomen: Soft, nontender, nondistended. No organomegaly noted, normoactive bowel sounds. Musculoskeletal: No edema, cyanosis, or clubbing. Neuro: Alert, answering all questions appropriately. Cranial nerves grossly intact. Skin: No rashes or petechiae noted. Psych: Normal affect.  LAB RESULTS:  Lab Results  Component Value Date   NA 141 09/11/2017   K 3.8 09/11/2017   CL 109 09/11/2017   CO2 21 (L) 09/11/2017   GLUCOSE 84 09/11/2017   BUN 25 (H) 09/11/2017   CREATININE 1.04 (H) 09/11/2017   CALCIUM 9.5 09/11/2017   PROT 7.5 09/11/2017   ALBUMIN 4.0 09/11/2017   AST 22 09/11/2017   ALT 16 09/11/2017   ALKPHOS 80 09/11/2017  BILITOT 0.3 09/11/2017    GFRNONAA >60 09/11/2017   GFRAA >60 09/11/2017    Lab Results  Component Value Date   WBC 8.3 09/11/2017   NEUTROABS 6.2 09/11/2017   HGB 14.1 09/11/2017   HCT 40.8 09/11/2017   MCV 98.6 09/11/2017   PLT 261 09/11/2017     STUDIES: Nm Cardiac Muga Rest  Result Date: 10/04/2017 CLINICAL DATA:  LEFT breast cancer, cardiotoxic chemotherapy EXAM: NUCLEAR MEDICINE CARDIAC BLOOD POOL IMAGING (MUGA) TECHNIQUE: Cardiac multi-gated acquisition was performed at rest following intravenous injection of Tc-28mpertechnetate labeled red blood cells. RADIOPHARMACEUTICALS:  25.7 mCi Tc-964mertechnetate in-vitro labeled red blood cells IV COMPARISON:  08/30/2017 FINDINGS: Calculated LEFT ventricular ejection fraction is 45.7%%, mildly decreased. This is not significantly changed from the 46.1% calculated on the prior exam. Study was obtained at a cardiac rate of 73 bpm. Patient was rhythmic during imaging. Cine analysis of the LEFT ventricle in 3 projections demonstrates no focal LEFT ventricular wall motion abnormalities. IMPRESSION: Mildly decreased LEFT ventricular ejection fraction of 45.7%, not significantly changed from the 46.1% calculated on 08/30/2017. No focal LEFT ventricular wall motion abnormalities. Electronically Signed   By: MaLavonia Dana.D.   On: 10/04/2017 16:25   Dg Bone Density  Result Date: 09/12/2017 EXAM: DUAL X-RAY ABSORPTIOMETRY (DXA) FOR BONE MINERAL DENSITY IMPRESSION: Dear Dr FiGrayland OrmondYour patient LiANAHID ESKELSONompleted a FRAX assessment on 09/12/2017 using the LuWoodland Parkanalysis version: 14.10) manufactured by GEEMCORThe following summarizes the results of our evaluation. PATIENT BIOGRAPHICAL: Name: CoFloride, Hutmacheratient ID: [id number] Date: 1211-28-1967eight:    [age] in. Gender:     Female    [age]       Weight:    140.8 lbs. Ethnicity:  White                            Exam Date: 09/12/2017 FRAX* RESULTS:  (version: 3.5) 10-year Probability of  Fracture1 Major Osteoporotic Fracture2 Hip Fracture 6.5% 1.2% Population: [place]) Risk Factors: Tobacco User (Current Smoker) Based on Femur (Left) Neck BMD 1 -The 10-year probability of fracture may be lower than reported if the patient has received treatment. 2 -Major Osteoporotic Fracture: Clinical Spine, Forearm, Hip or Shoulder *FRAX is a trMaterials engineerf the UnState Street Corporationf ShWalt Disneyor Metabolic Bone Disease, a WoWesthamptonWHO) CoQuest DiagnosticsASSESSMENT: The probability of a major osteoporotic fracture is 6.5% within the next ten years. The probability of a hip fracture is 1.2% within the next ten years. . Dear Dr FiGrayland OrmondYour patient LiBynum Bellowsompleted a BMD test on 09/12/2017 using the LuSt. Robertanalysis version: 14.10) manufactured by GEEMCORThe following summarizes the results of our evaluation. PATIENT BIOGRAPHICAL: Name: Briana [name], Roddatient ID: [id number] Date: 1231-Dec-1967eight: 62.0 in. Gender: Female Exam Date: 09/12/2017 Weight: 140.8 lbs. Indications: Breast CA, Caucasian, COPD, fibromyalgia, Height Loss, High Risk Meds, Oophorectomy Unilateral, Postmenopausal, Previous Chemo and Radiation, Tobacco User(Current Smoker), Tobacco User (Current Smoker) Fractures: Right foot Treatments: Femara, Multi-Vitamin, Omeprazole, topamax, Vitamin D ASSESSMENT: The BMD measured at Femur Neck Left is 0.788 g/cm2 with a T-score of -1.8. This patient is considered osteopenic according to WoMadaketWCommunity Hospitals And Wellness Centers Montpeliercriteria. L1 and L2 were excluded due to degenerative changes. Site Region Measured Measured WHO Young Adult BMD Date       Age  Classification T-score AP Spine L3-L4 09/12/2017 52.6 Normal -0.6 1.140 g/cm2 DualFemur Neck Left 09/12/2017 52.6 Osteopenia -1.8 0.788 g/cm2 World Health Organization [facility] criteria for post-menopausal, Caucasian Women: Normal:       T-score at or above -1 SD Osteopenia:   T-score between -1  and -2.5 SD Osteoporosis: T-score at or below -2.5 SD RECOMMENDATIONS: 1. All patients should optimize calcium and vitamin D intake. 2. Consider FDA-approved medical therapies in postmenopausal women and men aged 50 years and older, based on the following: a. A hip or vertebral(clinical or morphometric) fracture b. T-score < -2.5 at the femoral neck or spine after appropriate evaluation to exclude secondary causes c. Low bone mass (T-score between -1.0 and -2.5 at the femoral neck or spine) and a 10-year probability of a hip fracture > 3% or a 10-year probability of a major osteoporosis-related fracture > 20% based on the [place]-adapted WHO algorithm d. Clinician judgment and/or patient preferences may indicate treatment for people with 10-year fracture probabilities above or below these levels FOLLOW-UP: People with diagnosed cases of osteoporosis or at high risk for fracture should have regular bone mineral density tests. For patients eligible for Medicare, routine testing is allowed once every 2 years. The testing frequency can be increased to one year for patients who have rapidly progressing disease, those who are receiving or discontinuing medical therapy to restore bone mass, or have additional risk factors. I have reviewed this report, and agree with the above findings. [facility] Electronically Signed   By: Lowella Grip III M.D.   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  PLAN:    1. 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  Patient's most recent MUGA scan on August 30, 2017 revealed an EF of 46% which is decreased from previous to 53%.  Repeat MUGA on October 04, 2017 reported a persistent decrease in ejection fraction of 46%.  Because of this,  we will continue to hold Herceptin.  Patient was also given a referral to cardiology for evaluation and treatment.  Also, given patient's difficulty with letrozole she has been instructed to hold treatment for 1 month.  Patient will require a total of 5 years of treatment completing in July 2024.  Will discuss reinitiating another aromatase inhibitor or tamoxifen when she returns to clinic in 1 month for further evaluation.  2.  Genetic testing: Negative.  No further interventions are needed. 3.  Joint pain: Significantly worse, possibly secondary to letrozole.  Hold as above reevaluate in 1 month.   4.  Anxiety: Continue evaluation and treatment per psychiatry. 5.  Chronic pain: Patient does not complain of this today.  Continue appointments with pain clinic as scheduled. 6.  Reduced ejection fraction: MUGA results as above.  Continue to hold Herceptin and referral to cardiology for evaluation and treatment.   7.  Osteopenia: Patient had a bone marrow density completed on September 12, 2017 which reported a T score of -1.8.  Continue calcium and vitamin D supplementation.  Repeat in August 2020. 8.  Joint pain/hot flashes: Possibly secondary to letrozole.  Patient has been instructed to hold treatment for 1 month and will reevaluate at next clinic appointment.  Patient expressed understanding and was in agreement with this plan. She also understands that She can call clinic  at any time with any questions, concerns, or complaints.   Cancer Staging Malignant neoplasm of upper-outer quadrant of left breast in female, estrogen receptor positive ([place]) Staging form: Breast, AJCC 8th Edition - Clinical stage from 02/07/2017: Stage IA (cT1c, cN0, cM0, G1, ER: Positive, PR: Positive, HER2: Positive) - Signed by Lloyd Huger, MD on 02/11/2017   Lloyd Huger, MD   10/09/2017 11:51 AM

## 2017-10-09 ENCOUNTER — Encounter: Payer: Self-pay | Admitting: Oncology

## 2017-10-09 ENCOUNTER — Inpatient Hospital Stay: Payer: Medicare Other

## 2017-10-09 ENCOUNTER — Inpatient Hospital Stay: Payer: Medicare Other | Attending: Oncology | Admitting: Oncology

## 2017-10-09 VITALS — BP 103/73 | HR 91 | Temp 97.3°F | Resp 18 | Wt 144.2 lb

## 2017-10-09 DIAGNOSIS — I427 Cardiomyopathy due to drug and external agent: Secondary | ICD-10-CM | POA: Diagnosis not present

## 2017-10-09 DIAGNOSIS — F419 Anxiety disorder, unspecified: Secondary | ICD-10-CM | POA: Insufficient documentation

## 2017-10-09 DIAGNOSIS — F17210 Nicotine dependence, cigarettes, uncomplicated: Secondary | ICD-10-CM | POA: Diagnosis not present

## 2017-10-09 DIAGNOSIS — M8580 Other specified disorders of bone density and structure, unspecified site: Secondary | ICD-10-CM | POA: Diagnosis not present

## 2017-10-09 DIAGNOSIS — M2550 Pain in unspecified joint: Secondary | ICD-10-CM | POA: Insufficient documentation

## 2017-10-09 DIAGNOSIS — Z170 Estrogen receptor positive status [ER+]: Secondary | ICD-10-CM

## 2017-10-09 DIAGNOSIS — T451X5A Adverse effect of antineoplastic and immunosuppressive drugs, initial encounter: Secondary | ICD-10-CM | POA: Diagnosis not present

## 2017-10-09 DIAGNOSIS — C50412 Malignant neoplasm of upper-outer quadrant of left female breast: Secondary | ICD-10-CM | POA: Insufficient documentation

## 2017-10-09 DIAGNOSIS — N951 Menopausal and female climacteric states: Secondary | ICD-10-CM

## 2017-10-09 DIAGNOSIS — R9430 Abnormal result of cardiovascular function study, unspecified: Secondary | ICD-10-CM

## 2017-10-09 NOTE — Progress Notes (Signed)
Pt in for follow up, had 3 moles removed from back by Dr Phillip Heal and pt states "they were melanoma".  Pt is disappointed today regarding MUGA results and treatment plan.

## 2017-11-05 NOTE — Progress Notes (Signed)
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  INTERVAL HISTORY: Patient returns to clinic today for repeat laboratory work and further evaluation.  Despite agreed to discontinue treatment with letrozole, patient continue taking with little improvement of her joint pain or hot flashes.  She is also been evaluated by cardiology in the interim.  She has no neurologic complaints.  She denies any recent fevers or illnesses.  She has a good appetite and denies weight loss.  She has no chest pain, cough, hemoptysis, or shortness of breath. She denies any nausea, vomiting, constipation, or diarrhea.  She has no urinary complaints.  Patient offers no further specific complaints today.  REVIEW OF SYSTEMS:   Review of Systems  Constitutional: Negative.  Negative for fever, malaise/fatigue and weight loss.  Respiratory: Negative.  Negative for cough and shortness of breath.   Cardiovascular: Negative.  Negative for chest pain and leg swelling.  Gastrointestinal: Negative.  Negative for abdominal pain.  Genitourinary: Negative.  Negative for dysuria.  Musculoskeletal: Positive for joint pain.  Skin: Negative.  Negative for itching and rash.  Neurological: Positive for sensory change. Negative for focal weakness, weakness and headaches.  Psychiatric/Behavioral: Negative.  The patient is not nervous/anxious and does not have insomnia.     As per HPI. Otherwise, a complete review of systems is negative.  PAST MEDICAL HISTORY: Past Medical History:  Diagnosis Date  . Abnormal Pap smear of cervix    01/2015 ascus/neg- 04/2015 ascus/neg    . Allergy   . Anxiety   . Arthritis   . Asthma   . Chronic kidney disease   . COPD (chronic obstructive pulmonary disease) ([place])   . DDD (degenerative disc disease), lumbar   . Depression   . Elevated lipids   . Fibromyalgia   . Fibromyalgia   . Genetic testing 03/09/2017   Multi-Cancer panel (83 genes) @ [facility] - No pathogenic mutations detected  . GERD (gastroesophageal reflux disease)   . History of IBS   . Hyperthyroidism   . Joint disease   . Migraine   . Migraine   . Oxygen deficiency   . Restless leg syndrome   . Sleep apnea    Does not use C-PAP, cannot tolerate mask    PAST SURGICAL HISTORY: Past Surgical History:  Procedure Laterality Date  . ABDOMINAL HYSTERECTOMY  2008  . BREAST BIOPSY Left 02/02/2017   [place] core path pending  . BREAST EXCISIONAL BIOPSY Left 02/17/2017   lumpectomy with nl sn  . BREAST LUMPECTOMY WITH NEEDLE LOCALIZATION Left 02/17/2017   Procedure: BREAST LUMPECTOMY WITH NEEDLE LOCALIZATION;  Surgeon: Vickie Epley, MD;  Location: [facility];  Service: General;  Laterality: Left;  . CARPAL TUNNEL RELEASE Bilateral   . cryotherapy    . DILATION AND CURETTAGE OF UTERUS    . ENDOMETRIAL ABLATION    . LAPAROSCOPIC OOPHERECTOMY Left    unsure which side but thinks its the left  . LYSIS OF ADHESION Left 10/14/2015   Procedure: LYSIS OF ADHESION;  Surgeon: Thornton Park, MD;  Location: [facility];  Service: Orthopedics;  Laterality: Left;  . NECK SURGERY     lower neck fusion rods and  screws  . OOPHORECTOMY     one ovary removed   . PORTA CATH INSERTION N/A 03/08/2017   Procedure: PORTA CATH INSERTION;  Surgeon: Katha Cabal, MD;  Location: [facility];  Service: Cardiovascular;  Laterality: N/A;  . RESECTION DISTAL CLAVICAL Left 10/14/2015   Procedure: RESECTION DISTAL CLAVICAL;  Surgeon: Thornton Park, MD;  Location: [facility];  Service: Orthopedics;  Laterality: Left;  . SENTINEL NODE BIOPSY Left 02/17/2017   Procedure:  SENTINEL NODE BIOPSY;  Surgeon: Vickie Epley, MD;  Location: [facility];  Service: General;  Laterality: Left;  . SHOULDER ARTHROSCOPY WITH OPEN ROTATOR CUFF REPAIR Left 10/14/2015   Procedure: SHOULDER ARTHROSCOPY WITH OPEN ROTATOR CUFF REPAIR;  Surgeon: Thornton Park, MD;  Location: [facility];  Service: Orthopedics;  Laterality: Left;  . SHOULDER ARTHROSCOPY WITH OPEN ROTATOR CUFF REPAIR AND DISTAL CLAVICLE ACROMINECTOMY Right 09/03/2014   Procedure: RIGHT SHOULDER ARTHROSCOPY WITH MINI OPEN ROTATOR CUFF TEAR;  Surgeon: Thornton Park, MD;  Location: [facility];  Service: Orthopedics;  Laterality: Right;  biceps tenodesis, arthroscopic subacromial decompression and distal clavicle incision  . spinal injections    . SUBACROMIAL DECOMPRESSION Left 10/14/2015   Procedure: SUBACROMIAL DECOMPRESSION;  Surgeon: Thornton Park, MD;  Location: [facility];  Service: Orthopedics;  Laterality: Left;    FAMILY HISTORY: Family History  Problem Relation Age of Onset  . Breast cancer Mother 69       Glioblastoma also; deceased at 35  . Diabetes Father   . Lung cancer Father        mesothelioma; deceased 56s  . Cancer Maternal Aunt        "bone ca"; unk. primary  . Colon cancer Maternal Grandfather        dx in 18s; deceased 11  . Colon cancer Maternal Aunt        dx in 69s; currently 67s  . Lung cancer Maternal Grandmother        smoker; deceased 34  . Lung cancer Paternal Grandmother        smoker; deceased 14s  . Breast cancer Cousin        dx 16s; daughter of mat aunt with unk. primary cancer  . Cancer Other        distant cousin; unknown primary  . Colon cancer Other        dx 62s; currently 30; maternal half-sister  . Bipolar disorder Sister   . Schizophrenia Sister     ADVANCED DIRECTIVES (Y/N):  N  HEALTH MAINTENANCE: Social History   Tobacco Use  . Smoking status: Current Every Day Smoker    Packs/day: 1.00    Types: Cigarettes  . Smokeless tobacco: Never Used  Substance Use  Topics  . Alcohol use: No    Alcohol/week: 0.0 standard drinks  . Drug use: No     Colonoscopy:  PAP:  Bone density:  Lipid panel:  Allergies  Allergen Reactions  . Iodine   . Bee Venom Hives and Rash  . Cefoxitin Rash  . Cefuroxime Axetil Hives and Rash  . Cucumber Extract Rash  . Gadolinium Derivatives Swelling, Other (See Comments) and Rash    NECK BECAME RED AND TONGUE WAS SWELLING SLIGHTLY PER PT NECK BECAME RED AND TONGUE WAS SWELLING SLIGHTLY PER PT NECK BECAME RED AND TONGUE WAS SWELLING SLIGHTLY PER PT  . Iodinated Diagnostic Agents Hives and Rash  . Latex Rash  . Other Hives and Rash    Bee Stings-swelling/hives/rash Wool (textile fiber)-Rash/itching  .  Tomato Rash    Red tomatoes    Current Outpatient Medications  Medication Sig Dispense Refill  . baclofen (LIORESAL) 10 MG tablet Take 10 mg by mouth 3 (three) times daily as needed for muscle spasms (typically 2-3 times daily).     . cholecalciferol (VITAMIN D) 1000 UNITS tablet Take 1,000 Units by mouth 2 (two) times daily.     . clobetasol (OLUX) 0.05 % topical foam     . diclofenac sodium (VOLTAREN) 1 % GEL Apply 2 g topically 4 (four) times daily as needed (for pain.).     Marland Kitchen EPINEPHrine (EPIPEN JR) 0.15 MG/0.3ML injection Inject 0.15 mg into the muscle as needed for anaphylaxis.    . hydrocortisone 2.5 % cream Apply 1 application topically 2 (two) times daily as needed (for itchy/dry skin.).    Marland Kitchen lidocaine-prilocaine (EMLA) cream Apply to affected area once 30 g 3  . linaclotide (LINZESS) 145 MCG CAPS capsule Take 145 mcg by mouth daily as needed (for constipation.).    Marland Kitchen magnesium oxide (MAG-OX) 400 MG tablet Take 400 mg by mouth daily. Reported on 07/08/2015    . oxyCODONE 10 MG TABS Take 1 tablet (10 mg total) by mouth every 4 (four) hours as needed for breakthrough pain. 60 tablet 0  . promethazine (PHENERGAN) 25 MG tablet Take 25 mg by mouth every 6 (six) hours as needed for nausea or vomiting.    .  rizatriptan (MAXALT-MLT) 10 MG disintegrating tablet Take 10 mg by mouth as needed for migraine. May repeat in 2 hours if needed    . rosuvastatin (CRESTOR) 40 MG tablet Take 40 mg by mouth at bedtime.     . topiramate (TOPAMAX) 100 MG tablet Take 200 mg by mouth daily.     . vitamin E (VITAMIN E) 200 UNIT capsule Take 200 Units by mouth 4 (four) times a week. Take 4-5 times a week    . anastrozole (ARIMIDEX) 1 MG tablet Take 1 tablet (1 mg total) by mouth daily. 30 tablet 3   No current facility-administered medications for this visit.    Facility-Administered Medications Ordered in Other Visits  Medication Dose Route Frequency Provider Last Rate Last Dose  . heparin lock flush 100 unit/mL  500 Units Intravenous Once Lloyd Huger, MD      . sodium chloride flush (NS) 0.9 % injection 10 mL  10 mL Intravenous PRN Lloyd Huger, MD   10 mL at 08/21/17 1057    OBJECTIVE: Vitals:   11/06/17 1403  BP: 101/70  Pulse: 75  Temp: 98.2 F (36.8 C)     Body mass index is 26.08 kg/m.    ECOG FS:0 - Asymptomatic  General: Well-developed, well-nourished, no acute distress. Eyes: Pink conjunctiva, anicteric sclera. HEENT: Normocephalic, moist mucous membranes. Breast: Exam deferred today. Lungs: Clear to auscultation bilaterally. Heart: Regular rate and rhythm. No rubs, murmurs, or gallops. Abdomen: Soft, nontender, nondistended. No organomegaly noted, normoactive bowel sounds. Musculoskeletal: No edema, cyanosis, or clubbing. Neuro: Alert, answering all questions appropriately. Cranial nerves grossly intact. Skin: No rashes or petechiae noted. Psych: Normal affect.  LAB RESULTS:  Lab Results  Component Value Date   NA 141 09/11/2017   K 3.8 09/11/2017   CL 109 09/11/2017   CO2 21 (L) 09/11/2017   GLUCOSE 84 09/11/2017   BUN 25 (H) 09/11/2017   CREATININE 1.04 (H) 09/11/2017   CALCIUM 9.5 09/11/2017   PROT 7.5 09/11/2017   ALBUMIN 4.0 09/11/2017   AST 22 09/11/2017  ALT 16 09/11/2017   ALKPHOS 80 09/11/2017   BILITOT 0.3 09/11/2017   GFRNONAA >60 09/11/2017   GFRAA >60 09/11/2017    Lab Results  Component Value Date   WBC 8.3 09/11/2017   NEUTROABS 6.2 09/11/2017   HGB 14.1 09/11/2017   HCT 40.8 09/11/2017   MCV 98.6 09/11/2017   PLT 261 09/11/2017     STUDIES: No results found.  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  PLAN:    1. 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  Patient's most recent MUGA scan on August 30, 2017 revealed an EF of 46% which is decreased from previous to 53%.  Repeat MUGA on October 04, 2017 reported a persistent decrease in ejection fraction of 46%.  Patient is now actively being worked up by cardiology.  Will discontinue Herceptin altogether at this point.  Patient never discontinued letrozole, but has agreed to discontinue all treatment for the month of October and then reinitiate with anastrozole in approximately 1 month.  Return to clinic in 2 months for further evaluation.    Patient will require a total of 5 years of treatment completing in July 2024.  2.  Genetic testing: Negative.  No further interventions are needed. 3.  Joint pain: Unchanged.  Possibly secondary to letrozole.  Discontinue treatment as above.     4.  Anxiety: Continue evaluation and treatment per psychiatry. 5.  Chronic pain: Patient does not complain of this today.  Continue appointments with pain clinic as scheduled. 6.  Reduced ejection fraction: MUGA results as above.  Discontinue Herceptin and continue follow-up and treatment per cardiology.   7.  Osteopenia: Patient had a bone marrow density completed on September 12, 2017 which reported a T score of -1.8.  Continue calcium and vitamin D supplementation.  Repeat in  August 2020. 8.  Joint pain/hot flashes: Possibly secondary to letrozole.  Discontinue treatment as above and initiate anastrozole in November 2019.  Patient expressed understanding and was in agreement with this plan. She also understands that She can call clinic at any time with any questions, concerns, or complaints.   Cancer Staging Malignant neoplasm of upper-outer quadrant of left breast in female, estrogen receptor positive ([place]) Staging form: Breast, AJCC 8th Edition - Clinical stage from 02/07/2017: Stage IA (cT1c, cN0, cM0, G1, ER: Positive, PR: Positive, HER2: Positive) - Signed by Lloyd Huger, MD on 02/11/2017   Lloyd Huger, MD   11/07/2017 9:46 AM

## 2017-11-06 ENCOUNTER — Inpatient Hospital Stay: Payer: Medicare Other

## 2017-11-06 ENCOUNTER — Other Ambulatory Visit: Payer: Self-pay

## 2017-11-06 ENCOUNTER — Inpatient Hospital Stay: Payer: Medicare Other | Attending: Oncology | Admitting: Oncology

## 2017-11-06 ENCOUNTER — Encounter: Payer: Self-pay | Admitting: Oncology

## 2017-11-06 VITALS — BP 101/70 | HR 75 | Temp 98.2°F | Ht 63.0 in | Wt 147.2 lb

## 2017-11-06 DIAGNOSIS — Z95828 Presence of other vascular implants and grafts: Secondary | ICD-10-CM

## 2017-11-06 DIAGNOSIS — F419 Anxiety disorder, unspecified: Secondary | ICD-10-CM | POA: Insufficient documentation

## 2017-11-06 DIAGNOSIS — M2550 Pain in unspecified joint: Secondary | ICD-10-CM

## 2017-11-06 DIAGNOSIS — C50412 Malignant neoplasm of upper-outer quadrant of left female breast: Secondary | ICD-10-CM | POA: Insufficient documentation

## 2017-11-06 DIAGNOSIS — I427 Cardiomyopathy due to drug and external agent: Secondary | ICD-10-CM

## 2017-11-06 DIAGNOSIS — N951 Menopausal and female climacteric states: Secondary | ICD-10-CM | POA: Insufficient documentation

## 2017-11-06 DIAGNOSIS — Z170 Estrogen receptor positive status [ER+]: Secondary | ICD-10-CM | POA: Diagnosis not present

## 2017-11-06 DIAGNOSIS — M8580 Other specified disorders of bone density and structure, unspecified site: Secondary | ICD-10-CM | POA: Diagnosis not present

## 2017-11-06 MED ORDER — ANASTROZOLE 1 MG PO TABS: 1.0000 mg | ORAL_TABLET | Freq: Every day | ORAL | 3 refills | Status: DC

## 2017-11-06 MED ORDER — SODIUM CHLORIDE 0.9 % IJ SOLN
10.0000 mL | Freq: Once | INTRAMUSCULAR | Status: AC
  Administered 2017-11-06: 10 mL via INTRAVENOUS
  Filled 2017-11-06: qty 10

## 2017-11-06 MED ORDER — HEPARIN SOD (PORK) LOCK FLUSH 100 UNIT/ML IV SOLN
500.0000 [IU] | Freq: Once | INTRAVENOUS | Status: AC
  Administered 2017-11-06: 500 [IU] via INTRAVENOUS

## 2017-11-06 NOTE — Progress Notes (Signed)
Patient here for follow up. No changes since last appointment.  

## 2017-12-31 NOTE — Progress Notes (Signed)
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  INTERVAL HISTORY: Patient returns to clinic today for repeat laboratory work and routine 45-monthevaluation.  She is tolerating letrozole better although has continued mild joint pain and occasional hot flashes.  She continues to have chronic weakness and fatigue. She has no neurologic complaints.  She denies any recent fevers or illnesses.  She has a good appetite and denies weight loss.  She has no chest pain, cough, hemoptysis, or shortness of breath. She denies any nausea, vomiting, constipation, or diarrhea.  She has no urinary complaints.  Patient offers no further specific complaints today.  REVIEW OF SYSTEMS:   Review of Systems  Constitutional: Positive for malaise/fatigue. Negative for fever and weight loss.  Respiratory: Negative.  Negative for cough and shortness of breath.   Cardiovascular: Negative.  Negative for chest pain and leg swelling.  Gastrointestinal: Negative.  Negative for abdominal pain.  Genitourinary: Negative.  Negative for dysuria.  Musculoskeletal: Positive for joint pain.  Skin: Negative.  Negative for itching and rash.  Neurological: Positive for sensory change and weakness. Negative for focal weakness and headaches.  Psychiatric/Behavioral: Negative.  The patient is not nervous/anxious and does not have insomnia.     As per HPI. Otherwise, a complete review of systems is negative.  PAST MEDICAL HISTORY: Past Medical History:  Diagnosis Date  . Abnormal Pap smear of cervix    01/2015 ascus/neg- 04/2015 ascus/neg  . Allergy   . Anxiety   .  Arthritis   . Asthma   . Chronic kidney disease   . COPD (chronic obstructive pulmonary disease) ([facility]   . DDD (degenerative disc disease), lumbar   . Depression   . Elevated lipids   . Fibromyalgia   . Fibromyalgia   . Genetic testing 03/09/2017   Multi-Cancer panel (83 genes) @ [facility] - No pathogenic mutations detected  . GERD (gastroesophageal reflux disease)   . History of IBS   . Hyperthyroidism   . Joint disease   . Migraine   . Migraine   . Oxygen deficiency   . Restless leg syndrome   . Sleep apnea    Does not use C-PAP, cannot tolerate mask    PAST SURGICAL HISTORY: Past Surgical History:  Procedure Laterality Date  . ABDOMINAL HYSTERECTOMY  2008  . BREAST BIOPSY Left 02/02/2017   [place] path pending  . BREAST EXCISIONAL BIOPSY Left 02/17/2017   lumpectomy with nl sn  . BREAST LUMPECTOMY WITH NEEDLE LOCALIZATION Left 02/17/2017   Procedure: BREAST LUMPECTOMY WITH NEEDLE LOCALIZATION;  Surgeon: DVickie Epley MD;  Location: [facility];  Service: General;  Laterality: Left;  . CARPAL TUNNEL RELEASE Bilateral   . cryotherapy    . DILATION AND CURETTAGE OF UTERUS    . ENDOMETRIAL ABLATION    . LAPAROSCOPIC OOPHERECTOMY Left    unsure which side but thinks its the left  . LYSIS OF ADHESION Left 10/14/2015   Procedure: LYSIS OF ADHESION;  Surgeon: KThornton Park MD;  Location: [facility];  Service: Orthopedics;  Laterality: Left;  . NECK SURGERY     lower neck fusion rods and screws  . OOPHORECTOMY  one ovary removed   . PORTA CATH INSERTION N/A 03/08/2017   Procedure: PORTA CATH INSERTION;  Surgeon: Katha Cabal, MD;  Location: [place] [facility];  Service: Cardiovascular;  Laterality: N/A;  . RESECTION DISTAL CLAVICAL Left 10/14/2015   Procedure: RESECTION DISTAL CLAVICAL;  Surgeon: Thornton Park, MD;  Location: [facility];  Service: Orthopedics;  Laterality: Left;  . SENTINEL NODE BIOPSY Left 02/17/2017   Procedure: SENTINEL NODE BIOPSY;  Surgeon:  Vickie Epley, MD;  Location: [facility];  Service: General;  Laterality: Left;  . SHOULDER ARTHROSCOPY WITH OPEN ROTATOR CUFF REPAIR Left 10/14/2015   Procedure: SHOULDER ARTHROSCOPY WITH OPEN ROTATOR CUFF REPAIR;  Surgeon: Thornton Park, MD;  Location: [facility];  Service: Orthopedics;  Laterality: Left;  . SHOULDER ARTHROSCOPY WITH OPEN ROTATOR CUFF REPAIR AND DISTAL CLAVICLE ACROMINECTOMY Right 09/03/2014   Procedure: RIGHT SHOULDER ARTHROSCOPY WITH MINI OPEN ROTATOR CUFF TEAR;  Surgeon: Thornton Park, MD;  Location: [facility];  Service: Orthopedics;  Laterality: Right;  biceps tenodesis, arthroscopic subacromial decompression and distal clavicle incision  . spinal injections    . SUBACROMIAL DECOMPRESSION Left 10/14/2015   Procedure: SUBACROMIAL DECOMPRESSION;  Surgeon: Thornton Park, MD;  Location: [facility];  Service: Orthopedics;  Laterality: Left;    FAMILY HISTORY: Family History  Problem Relation Age of Onset  . Breast cancer Mother 22       Glioblastoma also; deceased at 102  . Diabetes Father   . Lung cancer Father        mesothelioma; deceased 38s  . Cancer Maternal Aunt        "bone ca"; unk. primary  . Colon cancer Maternal Grandfather        dx in 55s; deceased 10  . Colon cancer Maternal Aunt        dx in 30s; currently 31s  . Lung cancer Maternal Grandmother        smoker; deceased 55  . Lung cancer Paternal Grandmother        smoker; deceased 41s  . Breast cancer Cousin        dx 55s; daughter of mat aunt with unk. primary cancer  . Cancer Other        distant cousin; unknown primary  . Colon cancer Other        dx 31s; currently 32; maternal half-sister  . Bipolar disorder Sister   . Schizophrenia Sister     ADVANCED DIRECTIVES (Y/N):  N  HEALTH MAINTENANCE: Social History   Tobacco Use  . Smoking status: Current Every Day Smoker    Packs/day: 1.00    Types: Cigarettes  . Smokeless tobacco: Never Used  Substance Use Topics  . Alcohol use: No     Alcohol/week: 0.0 standard drinks  . Drug use: No     Colonoscopy:  PAP:  Bone density:  Lipid panel:  Allergies  Allergen Reactions  . Iodine   . Bee Venom Hives and Rash  . Cefoxitin Rash  . Cefuroxime Axetil Hives and Rash  . Cucumber Extract Rash  . Gadolinium Derivatives Swelling, Other (See Comments) and Rash    NECK BECAME RED AND TONGUE WAS SWELLING SLIGHTLY PER PT NECK BECAME RED AND TONGUE WAS SWELLING SLIGHTLY PER PT NECK BECAME RED AND TONGUE WAS SWELLING SLIGHTLY PER PT  . Iodinated Diagnostic Agents Hives and Rash  . Latex Rash  . Other Hives and Rash    Bee Stings-swelling/hives/rash Wool (textile fiber)-Rash/itching  . Tomato Rash    Red tomatoes  Current Outpatient Medications  Medication Sig Dispense Refill  . anastrozole (ARIMIDEX) 1 MG tablet Take 1 tablet (1 mg total) by mouth daily. 30 tablet 3  . baclofen (LIORESAL) 10 MG tablet Take 10 mg by mouth 3 (three) times daily as needed for muscle spasms (typically 2-3 times daily).     . calcium carbonate (CALCIUM 600) 600 MG TABS tablet Take by mouth.    . cholecalciferol (VITAMIN D) 1000 UNITS tablet Take 1,000 Units by mouth 2 (two) times daily.     . clindamycin-benzoyl peroxide (BENZACLIN) gel APPLY A SMALL AMOUNT TO AFFECTED SKIN LESION TWICE DAILY  3  . clobetasol (OLUX) 0.05 % topical foam     . diclofenac sodium (VOLTAREN) 1 % GEL Apply 2 g topically 4 (four) times daily as needed (for pain.).     Marland Kitchen EPINEPHrine (EPIPEN JR) 0.15 MG/0.3ML injection Inject 0.15 mg into the muscle as needed for anaphylaxis.    . hydrocortisone 2.5 % cream Apply 1 application topically 2 (two) times daily as needed (for itchy/dry skin.).    Marland Kitchen letrozole (FEMARA) 2.5 MG tablet Take by mouth.    . lidocaine-prilocaine (EMLA) cream Apply to affected area once 30 g 3  . linaclotide (LINZESS) 145 MCG CAPS capsule Take 145 mcg by mouth daily as needed (for constipation.).    Marland Kitchen magnesium oxide (MAG-OX) 400 MG tablet Take 400  mg by mouth daily. Reported on 07/08/2015    . oxyCODONE 10 MG TABS Take 1 tablet (10 mg total) by mouth every 4 (four) hours as needed for breakthrough pain. 60 tablet 0  . promethazine (PHENERGAN) 25 MG tablet Take 25 mg by mouth every 6 (six) hours as needed for nausea or vomiting.    . rizatriptan (MAXALT-MLT) 10 MG disintegrating tablet Take 10 mg by mouth as needed for migraine. May repeat in 2 hours if needed    . rosuvastatin (CRESTOR) 40 MG tablet Take 40 mg by mouth at bedtime.     . topiramate (TOPAMAX) 100 MG tablet Take 200 mg by mouth daily.     . vitamin E (VITAMIN E) 200 UNIT capsule Take 200 Units by mouth 4 (four) times a week. Take 4-5 times a week     No current facility-administered medications for this visit.    Facility-Administered Medications Ordered in Other Visits  Medication Dose Route Frequency Provider Last Rate Last Dose  . heparin lock flush 100 unit/mL  500 Units Intravenous Once Lloyd Huger, MD      . sodium chloride flush (NS) 0.9 % injection 10 mL  10 mL Intravenous PRN Lloyd Huger, MD   10 mL at 08/21/17 1057    OBJECTIVE: Vitals:   01/01/18 1519  BP: 140/90  Temp: 97.9 F (36.6 C)     Body mass index is 26.77 kg/m.    ECOG FS:0 - Asymptomatic  General: Well-developed, well-nourished, no acute distress. Eyes: Pink conjunctiva, anicteric sclera. HEENT: Normocephalic, moist mucous membranes. Breast: Exam deferred today. Lungs: Clear to auscultation bilaterally. Heart: Regular rate and rhythm. No rubs, murmurs, or gallops. Abdomen: Soft, nontender, nondistended. No organomegaly noted, normoactive bowel sounds. Musculoskeletal: No edema, cyanosis, or clubbing. Neuro: Alert, answering all questions appropriately. Cranial nerves grossly intact. Skin: No rashes or petechiae noted. Psych: Normal affect.  LAB RESULTS:  Lab Results  Component Value Date   NA 141 09/11/2017   K 3.8 09/11/2017   CL 109 09/11/2017   CO2 21 (L)  09/11/2017   GLUCOSE 84  09/11/2017   BUN 25 (H) 09/11/2017   CREATININE 1.04 (H) 09/11/2017   CALCIUM 9.5 09/11/2017   PROT 7.5 09/11/2017   ALBUMIN 4.0 09/11/2017   AST 22 09/11/2017   ALT 16 09/11/2017   ALKPHOS 80 09/11/2017   BILITOT 0.3 09/11/2017   GFRNONAA >60 09/11/2017   GFRAA >60 09/11/2017    Lab Results  Component Value Date   WBC 8.3 09/11/2017   NEUTROABS 6.2 09/11/2017   HGB 14.1 09/11/2017   HCT 40.8 09/11/2017   MCV 98.6 09/11/2017   PLT 261 09/11/2017     STUDIES: No results found.  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  PLAN:    1. 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  Patient initiated adjuvant chemotherapy with Taxol and Herceptin on March 13, 2017.  She completed Taxol on May 29, 2017.  Patient then received 4 additional cycles of Herceptin maintenance treatment, but this was permanently discontinued on August 21, 2017 secondary to persistently low EF on MUGA scan.  He is currently taking letrozole and will complete 5 years of treatment in July 2024.  Her next mammogram will be scheduled in January 2020.  Return to clinic in 3 months for routine evaluation.   2.  Genetic testing: Negative.  No further interventions are needed. 3.  Joint pain: Unchanged.  Possibly secondary to letrozole.  Monitor. 4.  Anxiety: Continue evaluation and treatment per psychiatry. 5.  Chronic pain: Patient does not complain of this today.  Continue appointments with pain clinic as scheduled. 6.  Reduced ejection fraction: Herceptin has been discontinued permanently.  Continue work-up and evaluation per cardiology.  Most recent cardiac echo revealed EF of approximately 50%.   7.  Osteopenia: Patient had a bone marrow density completed on September 12, 2017 which reported a T score of -1.8.  Continue calcium and vitamin D supplementation.  Repeat in August 2020. 8.  Persistent weakness and fatigue: Patient was given a referral to the CARE program.  Patient expressed understanding and was in agreement with this plan. She also understands that She can call clinic at any time with any questions, concerns, or complaints.   Cancer Staging Malignant neoplasm of upper-outer quadrant of left breast in female, estrogen receptor positive ([place]) Staging form: Breast, AJCC 8th Edition - Clinical stage from 02/07/2017: Stage IA (cT1c, cN0, cM0, G1, ER: Positive, PR: Positive, HER2: Positive) - Signed by Lloyd Huger, MD on 02/11/2017   Lloyd Huger, MD   01/04/2018 6:38 AM

## 2018-01-01 ENCOUNTER — Inpatient Hospital Stay: Payer: Medicare Other | Attending: Oncology

## 2018-01-01 ENCOUNTER — Other Ambulatory Visit: Payer: Self-pay

## 2018-01-01 ENCOUNTER — Inpatient Hospital Stay (HOSPITAL_BASED_OUTPATIENT_CLINIC_OR_DEPARTMENT_OTHER): Payer: Medicare Other | Admitting: Oncology

## 2018-01-01 VITALS — BP 140/90 | Temp 97.9°F | Wt 151.1 lb

## 2018-01-01 DIAGNOSIS — Z79899 Other long term (current) drug therapy: Secondary | ICD-10-CM

## 2018-01-01 DIAGNOSIS — F17210 Nicotine dependence, cigarettes, uncomplicated: Secondary | ICD-10-CM

## 2018-01-01 DIAGNOSIS — C50412 Malignant neoplasm of upper-outer quadrant of left female breast: Secondary | ICD-10-CM

## 2018-01-01 DIAGNOSIS — M2550 Pain in unspecified joint: Secondary | ICD-10-CM | POA: Insufficient documentation

## 2018-01-01 DIAGNOSIS — Z170 Estrogen receptor positive status [ER+]: Secondary | ICD-10-CM | POA: Diagnosis not present

## 2018-01-01 DIAGNOSIS — Z79811 Long term (current) use of aromatase inhibitors: Secondary | ICD-10-CM | POA: Insufficient documentation

## 2018-01-01 DIAGNOSIS — F419 Anxiety disorder, unspecified: Secondary | ICD-10-CM | POA: Insufficient documentation

## 2018-01-01 DIAGNOSIS — M8580 Other specified disorders of bone density and structure, unspecified site: Secondary | ICD-10-CM | POA: Insufficient documentation

## 2018-01-01 DIAGNOSIS — Z95828 Presence of other vascular implants and grafts: Secondary | ICD-10-CM

## 2018-01-01 MED ORDER — SODIUM CHLORIDE 0.9% FLUSH
10.0000 mL | Freq: Once | INTRAVENOUS | Status: AC
  Administered 2018-01-01: 10 mL via INTRAVENOUS
  Filled 2018-01-01: qty 10

## 2018-01-01 MED ORDER — HEPARIN SOD (PORK) LOCK FLUSH 100 UNIT/ML IV SOLN
INTRAVENOUS | Status: AC
  Filled 2018-01-01: qty 5

## 2018-01-01 MED ORDER — HEPARIN SOD (PORK) LOCK FLUSH 100 UNIT/ML IV SOLN
500.0000 [IU] | Freq: Once | INTRAVENOUS | Status: AC
  Administered 2018-01-01: 500 [IU] via INTRAVENOUS

## 2018-01-01 NOTE — Progress Notes (Signed)
Patient is here for follow up on her left breast cancer. Patient stated that she has no energy to do anything. She tries to walk around the house but stays tired. Patient is not able to sleep at night despite cutting her caffeine intake during the day. Patient stated that Letrozole has been doing better now than before. Patient stated that she has a bowel movement it tends to be loose stools.

## 2018-02-19 ENCOUNTER — Inpatient Hospital Stay: Payer: Medicare Other | Attending: Oncology

## 2018-02-19 ENCOUNTER — Other Ambulatory Visit: Payer: Self-pay | Admitting: Oncology

## 2018-02-19 DIAGNOSIS — C50412 Malignant neoplasm of upper-outer quadrant of left female breast: Secondary | ICD-10-CM | POA: Insufficient documentation

## 2018-02-19 DIAGNOSIS — Z79811 Long term (current) use of aromatase inhibitors: Secondary | ICD-10-CM | POA: Diagnosis not present

## 2018-02-19 DIAGNOSIS — C50512 Malignant neoplasm of lower-outer quadrant of left female breast: Secondary | ICD-10-CM | POA: Diagnosis present

## 2018-02-19 DIAGNOSIS — F419 Anxiety disorder, unspecified: Secondary | ICD-10-CM | POA: Diagnosis not present

## 2018-02-19 DIAGNOSIS — M2550 Pain in unspecified joint: Secondary | ICD-10-CM | POA: Diagnosis not present

## 2018-02-19 DIAGNOSIS — F17210 Nicotine dependence, cigarettes, uncomplicated: Secondary | ICD-10-CM | POA: Insufficient documentation

## 2018-02-19 DIAGNOSIS — Z170 Estrogen receptor positive status [ER+]: Secondary | ICD-10-CM | POA: Diagnosis not present

## 2018-02-19 DIAGNOSIS — Z79899 Other long term (current) drug therapy: Secondary | ICD-10-CM | POA: Diagnosis not present

## 2018-02-19 DIAGNOSIS — Z95828 Presence of other vascular implants and grafts: Secondary | ICD-10-CM

## 2018-02-19 MED ORDER — SODIUM CHLORIDE 0.9% FLUSH
10.0000 mL | Freq: Once | INTRAVENOUS | Status: AC
  Administered 2018-02-19: 10 mL via INTRAVENOUS
  Filled 2018-02-19: qty 10

## 2018-02-19 MED ORDER — HEPARIN SOD (PORK) LOCK FLUSH 100 UNIT/ML IV SOLN
500.0000 [IU] | Freq: Once | INTRAVENOUS | Status: AC
  Administered 2018-02-19: 500 [IU] via INTRAVENOUS

## 2018-02-20 ENCOUNTER — Other Ambulatory Visit: Payer: Self-pay | Admitting: Oncology

## 2018-02-21 ENCOUNTER — Other Ambulatory Visit: Payer: Self-pay | Admitting: *Deleted

## 2018-02-21 NOTE — Telephone Encounter (Signed)
Was refilled yesterday.

## 2018-03-01 ENCOUNTER — Other Ambulatory Visit: Payer: Self-pay

## 2018-03-01 ENCOUNTER — Encounter: Payer: Self-pay | Admitting: Radiation Oncology

## 2018-03-01 ENCOUNTER — Ambulatory Visit
Admission: RE | Admit: 2018-03-01 | Discharge: 2018-03-01 | Disposition: A | Discharge status: Home / Self Care | Payer: Medicare Other | Source: Ambulatory Visit | Attending: Radiation Oncology | Admitting: Radiation Oncology

## 2018-03-01 VITALS — BP 122/83 | HR 108 | Temp 96.8°F | Resp 16 | Wt 154.3 lb

## 2018-03-01 DIAGNOSIS — Z923 Personal history of irradiation: Secondary | ICD-10-CM | POA: Insufficient documentation

## 2018-03-01 DIAGNOSIS — Z170 Estrogen receptor positive status [ER+]: Secondary | ICD-10-CM | POA: Insufficient documentation

## 2018-03-01 DIAGNOSIS — Z79811 Long term (current) use of aromatase inhibitors: Secondary | ICD-10-CM | POA: Insufficient documentation

## 2018-03-01 DIAGNOSIS — C50412 Malignant neoplasm of upper-outer quadrant of left female breast: Secondary | ICD-10-CM

## 2018-03-01 NOTE — Progress Notes (Signed)
Radiation Oncology Follow up Note  Name: DRU LAUREL   Date:   03/01/2018 MRN:  [id number] DOB: 1965/06/20    This 53 y.o. female presents to the clinic today for 5 month follow-up status post whole breast radiation to her left breast for stage IA triple positive invasive mammary carcinoma.  REFERRING PROVIDER: [name], MD  HPI: patient is a 53 year old female now out 5 months having completed whole breast radiation to her left breast for stage IA triple positive invasive mammary carcinoma. She is completed maintenance Herceptin and isbeing scheduled have her port removed. She specifically denies breast tenderness cough or bone pain..she's currently on arimadex tolerate that well without side effect. She's not yet had a scheduled mammogram.  COMPLICATIONS OF TREATMENT: none  FOLLOW UP COMPLIANCE: keeps appointments   PHYSICAL EXAM:  BP 122/83   Pulse (!) 108   Temp (!) 96.8 F (36 C)   Resp 16   Wt 154 lb 5.2 oz (70 kg)   LMP 08/24/2008   BMI 27.34 kg/m  Lungs are clear to A&P cardiac examination essentially unremarkable with regular rate and rhythm. No dominant mass or nodularity is noted in either breast in 2 positions examined. Incision is well-healed. No axillary or supraclavicular adenopathy is appreciated. Cosmetic result is excellent.Well-developed well-nourished patient in NAD. HEENT reveals PERLA, EOMI, discs not visualized.  Oral cavity is clear. No oral mucosal lesions are identified. Neck is clear without evidence of cervical or supraclavicular adenopathy. Lungs are clear to A&P. Cardiac examination is essentially unremarkable with regular rate and rhythm without murmur rub or thrill. Abdomen is benign with no organomegaly or masses noted. Motor sensory and DTR levels are equal and symmetric in the upper and lower extremities. Cranial nerves II through XII are grossly intact. Proprioception is intact. No peripheral adenopathy or edema is identified. No motor or sensory  levels are noted. Crude visual fields are within normal range.  RADIOLOGY RESULTS: no current films for review  PLAN: present time patient is doing well with no evidence of disease now 5 months out. She I'm please were overall progress. I've asked to see her back in 6 months for follow-up and then will start once your follow-up appointments. She continues on aromatase without side effect. Patient is to call at anytime with any concerns.  I would like to take this opportunity to thank you for allowing me to participate in the care of your patient.Noreene Filbert, MD

## 2018-03-02 ENCOUNTER — Other Ambulatory Visit: Payer: Self-pay | Admitting: Oncology

## 2018-03-04 ENCOUNTER — Other Ambulatory Visit: Payer: Self-pay | Admitting: Oncology

## 2018-04-08 NOTE — Progress Notes (Signed)
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  INTERVAL HISTORY: Patient returns to clinic today for routine 110-monthevaluation.  She had a syncopal episode yesterday at work which appears to be orthostatic in nature.  She continues to feel weak and fatigue stemming from this incident, but otherwise feels well.  She continues to tolerate letrozole only with some mild joint pain. She has no neurologic complaints.  She denies any recent fevers or illnesses.  She has a good appetite and denies weight loss.  She has no chest pain, cough, hemoptysis, or shortness of breath. She denies any nausea, vomiting, constipation, or diarrhea.  She has no urinary complaints.  Patient offers no further specific complaints today.  REVIEW OF SYSTEMS:   Review of Systems  Constitutional: Positive for malaise/fatigue. Negative for fever and weight loss.  Respiratory: Negative.  Negative for cough, hemoptysis and shortness of breath.   Cardiovascular: Negative.  Negative for chest pain, palpitations and leg swelling.  Gastrointestinal: Negative.  Negative for abdominal pain.  Genitourinary: Negative.  Negative for dysuria.  Musculoskeletal: Positive for joint pain.  Skin: Negative.  Negative for itching and rash.  Neurological: Positive for sensory change and weakness. Negative for dizziness, focal weakness, loss of  consciousness and headaches.  Psychiatric/Behavioral: Negative.  The patient is not nervous/anxious and does not have insomnia.     As per HPI. Otherwise, a complete review of systems is negative.  PAST MEDICAL HISTORY: Past Medical History:  Diagnosis Date  . Abnormal Pap smear of cervix    01/2015 ascus/neg- 04/2015 ascus/neg  . Allergy   . Anxiety   . Arthritis   . Asthma   . Chronic kidney disease   . [place]   . DDD (degenerative disc disease), lumbar   . Depression   . Elevated lipids   . Fibromyalgia   . Fibromyalgia   . Genetic testing 03/09/2017   Multi-Cancer panel (83 genes) @ [facility] - No pathogenic mutations detected  . GERD (gastroesophageal reflux disease)   . History of IBS   . Hyperthyroidism   . Joint disease   . Migraine   . Migraine   . Oxygen deficiency   . Restless leg syndrome   . Sleep apnea    Does not use C-PAP, cannot tolerate mask    PAST SURGICAL HISTORY: Past Surgical History:  Procedure Laterality Date  . ABDOMINAL HYSTERECTOMY  2008  . BREAST BIOPSY Left 02/02/2017   [facility] path pending  . BREAST EXCISIONAL BIOPSY Left 02/17/2017   lumpectomy with nl sn  . BREAST LUMPECTOMY WITH NEEDLE LOCALIZATION Left 02/17/2017   Procedure: BREAST LUMPECTOMY WITH NEEDLE LOCALIZATION;  Surgeon: DVickie Epley MD;  Location: [facility];  Service: General;  Laterality: Left;  . CARPAL TUNNEL RELEASE Bilateral   . cryotherapy    . DILATION AND CURETTAGE OF UTERUS    . ENDOMETRIAL ABLATION    . LAPAROSCOPIC OOPHERECTOMY Left    unsure which  side but thinks its the left  . LYSIS OF ADHESION Left 10/14/2015   Procedure: LYSIS OF ADHESION;  Surgeon: Thornton Park, MD;  Location: [facility];  Service: Orthopedics;  Laterality: Left;  . NECK SURGERY     lower neck fusion rods and screws  . OOPHORECTOMY     one ovary removed   . PORTA CATH INSERTION N/A 03/08/2017   Procedure: PORTA CATH INSERTION;  Surgeon:  Katha Cabal, MD;  Location: [place] [facility];  Service: Cardiovascular;  Laterality: N/A;  . RESECTION DISTAL CLAVICAL Left 10/14/2015   Procedure: RESECTION DISTAL CLAVICAL;  Surgeon: Thornton Park, MD;  Location: [facility];  Service: Orthopedics;  Laterality: Left;  . SENTINEL NODE BIOPSY Left 02/17/2017   Procedure: SENTINEL NODE BIOPSY;  Surgeon: Vickie Epley, MD;  Location: [facility];  Service: General;  Laterality: Left;  . SHOULDER ARTHROSCOPY WITH OPEN ROTATOR CUFF REPAIR Left 10/14/2015   Procedure: SHOULDER ARTHROSCOPY WITH OPEN ROTATOR CUFF REPAIR;  Surgeon: Thornton Park, MD;  Location: [facility];  Service: Orthopedics;  Laterality: Left;  . SHOULDER ARTHROSCOPY WITH OPEN ROTATOR CUFF REPAIR AND DISTAL CLAVICLE ACROMINECTOMY Right 09/03/2014   Procedure: RIGHT SHOULDER ARTHROSCOPY WITH MINI OPEN ROTATOR CUFF TEAR;  Surgeon: Thornton Park, MD;  Location: [facility];  Service: Orthopedics;  Laterality: Right;  biceps tenodesis, arthroscopic subacromial decompression and distal clavicle incision  . spinal injections    . SUBACROMIAL DECOMPRESSION Left 10/14/2015   Procedure: SUBACROMIAL DECOMPRESSION;  Surgeon: Thornton Park, MD;  Location: [facility];  Service: Orthopedics;  Laterality: Left;    FAMILY HISTORY: Family History  Problem Relation Age of Onset  . Breast cancer Mother 79       Glioblastoma also; deceased at 39  . Diabetes Father   . Lung cancer Father        mesothelioma; deceased 62s  . Cancer Maternal Aunt        "bone ca"; unk. primary  . Colon cancer Maternal Grandfather        dx in 69s; deceased 2  . Colon cancer Maternal Aunt        dx in 73s; currently 83s  . Lung cancer Maternal Grandmother        smoker; deceased 24  . Lung cancer Paternal Grandmother        smoker; deceased 43s  . Breast cancer Cousin        dx 13s; daughter of mat aunt with unk. primary cancer  . Cancer Other        distant cousin; unknown primary  . Colon cancer Other         dx 49s; currently 82; maternal half-sister  . Bipolar disorder Sister   . Schizophrenia Sister     ADVANCED DIRECTIVES (Y/N):  N  HEALTH MAINTENANCE: Social History   Tobacco Use  . Smoking status: Current Every Day Smoker    Packs/day: 1.00    Types: Cigarettes  . Smokeless tobacco: Never Used  Substance Use Topics  . Alcohol use: No    Alcohol/week: 0.0 standard drinks  . Drug use: No     Colonoscopy:  PAP:  Bone density:  Lipid panel:  Allergies  Allergen Reactions  . Iodine   . Bee Venom Hives and Rash  . Cefoxitin Rash  . Cefuroxime Axetil Hives and Rash  . Cucumber Extract Rash  . Gadolinium Derivatives Swelling, Other (See Comments) and Rash    NECK BECAME RED AND TONGUE WAS SWELLING SLIGHTLY PER PT NECK  BECAME RED AND TONGUE WAS SWELLING SLIGHTLY PER PT NECK BECAME RED AND TONGUE WAS SWELLING SLIGHTLY PER PT  . Iodinated Diagnostic Agents Hives and Rash  . Latex Rash  . Other Hives and Rash    Bee Stings-swelling/hives/rash Wool (textile fiber)-Rash/itching  . Tomato Rash    Red tomatoes    Current Outpatient Medications  Medication Sig Dispense Refill  . anastrozole (ARIMIDEX) 1 MG tablet TAKE 1 TABLET BY MOUTH ONCE DAILY 90 tablet 0  . baclofen (LIORESAL) 10 MG tablet Take 10 mg by mouth 3 (three) times daily as needed for muscle spasms (typically 2-3 times daily).     . calcium carbonate (CALCIUM 600) 600 MG TABS tablet Take by mouth.    . cholecalciferol (VITAMIN D) 1000 UNITS tablet Take 1,000 Units by mouth 2 (two) times daily.     . clindamycin-benzoyl peroxide (BENZACLIN) gel APPLY A SMALL AMOUNT TO AFFECTED SKIN LESION TWICE DAILY  3  . EPINEPHrine (EPIPEN JR) 0.15 MG/0.3ML injection Inject 0.15 mg into the muscle as needed for anaphylaxis.    . hydrocortisone 2.5 % cream Apply 1 application topically 2 (two) times daily as needed (for itchy/dry skin.).    Marland Kitchen hydrOXYzine (ATARAX/VISTARIL) 25 MG tablet Take 25 mg by mouth at bedtime.    Marland Kitchen  ketoconazole (NIZORAL) 2 % shampoo     . lidocaine-prilocaine (EMLA) cream APPLY TO AFFECTED AREA ONCE 30 g 2  . linaclotide (LINZESS) 145 MCG CAPS capsule Take 145 mcg by mouth daily as needed (for constipation.).    Marland Kitchen magnesium oxide (MAG-OX) 400 MG tablet Take 400 mg by mouth daily. Reported on 07/08/2015    . oxyCODONE 10 MG TABS Take 1 tablet (10 mg total) by mouth every 4 (four) hours as needed for breakthrough pain. (Patient taking differently: Take 15 mg by mouth every 4 (four) hours as needed for breakthrough pain. ) 60 tablet 0  . promethazine (PHENERGAN) 25 MG tablet Take 25 mg by mouth every 6 (six) hours as needed for nausea or vomiting.    . rizatriptan (MAXALT-MLT) 10 MG disintegrating tablet Take 10 mg by mouth as needed for migraine. May repeat in 2 hours if needed    . rosuvastatin (CRESTOR) 40 MG tablet Take 40 mg by mouth at bedtime.     . topiramate (TOPAMAX) 100 MG tablet Take 200 mg by mouth daily.     . vitamin E (VITAMIN E) 200 UNIT capsule Take 200 Units by mouth 4 (four) times a week. Take 4-5 times a week    . clobetasol (OLUX) 0.05 % topical foam     . diclofenac sodium (VOLTAREN) 1 % GEL Apply 2 g topically 4 (four) times daily as needed (for pain.).      No current facility-administered medications for this visit.    Facility-Administered Medications Ordered in Other Visits  Medication Dose Route Frequency Provider Last Rate Last Dose  . heparin lock flush 100 unit/mL  500 Units Intravenous Once Lloyd Huger, MD      . sodium chloride flush (NS) 0.9 % injection 10 mL  10 mL Intravenous PRN Lloyd Huger, MD   10 mL at 08/21/17 1057    OBJECTIVE: There were no vitals filed for this visit.   There is no height or weight on file to calculate BMI.    ECOG FS:0 - Asymptomatic  General: Well-developed, well-nourished, no acute distress. Eyes: Pink conjunctiva, anicteric sclera. HEENT: Normocephalic, moist mucous membranes. Breast: Patient declined breast  exam  today. Lungs: Clear to auscultation bilaterally. Heart: Regular rate and rhythm. No rubs, murmurs, or gallops. Abdomen: Soft, nontender, nondistended. No organomegaly noted, normoactive bowel sounds. Musculoskeletal: No edema, cyanosis, or clubbing. Neuro: Alert, answering all questions appropriately. Cranial nerves grossly intact. Skin: No rashes or petechiae noted. Psych: Normal affect.  LAB RESULTS:  Lab Results  Component Value Date   NA 141 09/11/2017   K 3.8 09/11/2017   CL 109 09/11/2017   CO2 21 (L) 09/11/2017   GLUCOSE 84 09/11/2017   BUN 25 (H) 09/11/2017   CREATININE 1.04 (H) 09/11/2017   CALCIUM 9.5 09/11/2017   PROT 7.5 09/11/2017   ALBUMIN 4.0 09/11/2017   AST 22 09/11/2017   ALT 16 09/11/2017   ALKPHOS 80 09/11/2017   BILITOT 0.3 09/11/2017   GFRNONAA >60 09/11/2017   GFRAA >60 09/11/2017    Lab Results  Component Value Date   WBC 8.3 09/11/2017   NEUTROABS 6.2 09/11/2017   HGB 14.1 09/11/2017   HCT 40.8 09/11/2017   MCV 98.6 09/11/2017   PLT 261 09/11/2017     STUDIES: No results found.  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  PLAN:    1. 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  Patient initiated adjuvant chemotherapy with Taxol and Herceptin on March 13, 2017.  She completed Taxol on May 29, 2017.  Patient then received 4 additional cycles of Herceptin maintenance treatment, but this was permanently discontinued on August 21, 2017 secondary to persistently low EF on MUGA scan.  Continue letrozole for a total of 5 years completing treatment in July 2024.  She will require mammogram in the next 1 to 2 weeks.  Return to clinic in 6 months for routine evaluation.     2.  Genetic testing: Negative.  No further  interventions are needed. 3.  Joint pain: Unchanged.  Possibly secondary to letrozole.  Monitor. 4.  Anxiety: Continue evaluation and treatment per psychiatry. 5.  Chronic pain: Patient does not complain of this today.  Continue appointments with pain clinic as scheduled. 6.  Reduced ejection fraction: Herceptin has been discontinued permanently.  Continue work-up and evaluation per cardiology.  Most recent cardiac echo revealed EF of approximately 50%.   7.  Osteopenia: Patient had a bone marrow density completed on September 12, 2017 which reported a T score of -1.8.  Continue calcium and vitamin D supplementation.  Repeat in August 2020. 8.  Persistent weakness and fatigue: Patient was previously given a referral to the CARE program. 9.  Syncopal episode: Patient was evaluated by EMS and appears to be orthostatic.  She has no other symptoms.  She has been instructed to call her cardiologist for further evaluation.  I spent a total of 30 minutes face-to-face with the patient of which greater than 50% of the visit was spent in counseling and coordination of care as detailed above.   Patient expressed understanding and was in agreement with this plan. She also understands that She can call clinic at any time with any questions, concerns, or complaints.   Cancer Staging Malignant neoplasm of upper-outer quadrant of left breast in female, estrogen receptor positive ([place]) Staging form: Breast, AJCC 8th Edition - Clinical stage from 02/07/2017: Stage IA (cT1c, cN0, cM0, G1, ER: Positive, PR: Positive, HER2: Positive) - Signed by Lloyd Huger,  MD on 02/11/2017   Lloyd Huger, MD   04/09/2018 2:51 PM

## 2018-04-09 ENCOUNTER — Inpatient Hospital Stay: Payer: Medicare Other | Attending: Oncology

## 2018-04-09 ENCOUNTER — Ambulatory Visit: Payer: Medicare Other

## 2018-04-09 ENCOUNTER — Inpatient Hospital Stay (HOSPITAL_BASED_OUTPATIENT_CLINIC_OR_DEPARTMENT_OTHER): Payer: Medicare Other | Admitting: Oncology

## 2018-04-09 ENCOUNTER — Other Ambulatory Visit: Payer: Self-pay

## 2018-04-09 DIAGNOSIS — M2550 Pain in unspecified joint: Secondary | ICD-10-CM

## 2018-04-09 DIAGNOSIS — C50412 Malignant neoplasm of upper-outer quadrant of left female breast: Secondary | ICD-10-CM

## 2018-04-09 DIAGNOSIS — M8580 Other specified disorders of bone density and structure, unspecified site: Secondary | ICD-10-CM | POA: Diagnosis not present

## 2018-04-09 DIAGNOSIS — Z170 Estrogen receptor positive status [ER+]: Secondary | ICD-10-CM | POA: Diagnosis not present

## 2018-04-09 DIAGNOSIS — Z9221 Personal history of antineoplastic chemotherapy: Secondary | ICD-10-CM | POA: Diagnosis not present

## 2018-04-09 DIAGNOSIS — Z95828 Presence of other vascular implants and grafts: Secondary | ICD-10-CM

## 2018-04-09 DIAGNOSIS — F419 Anxiety disorder, unspecified: Secondary | ICD-10-CM | POA: Insufficient documentation

## 2018-04-09 DIAGNOSIS — Z79811 Long term (current) use of aromatase inhibitors: Secondary | ICD-10-CM | POA: Insufficient documentation

## 2018-04-09 DIAGNOSIS — C50512 Malignant neoplasm of lower-outer quadrant of left female breast: Secondary | ICD-10-CM | POA: Diagnosis present

## 2018-04-09 MED ORDER — SODIUM CHLORIDE 0.9% FLUSH
10.0000 mL | Freq: Once | INTRAVENOUS | Status: AC
  Administered 2018-04-09: 10 mL via INTRAVENOUS
  Filled 2018-04-09: qty 10

## 2018-04-09 MED ORDER — HEPARIN SOD (PORK) LOCK FLUSH 100 UNIT/ML IV SOLN
500.0000 [IU] | Freq: Once | INTRAVENOUS | Status: AC
  Administered 2018-04-09: 500 [IU] via INTRAVENOUS
  Filled 2018-04-09: qty 5

## 2018-04-09 MED ORDER — HEPARIN SOD (PORK) LOCK FLUSH 100 UNIT/ML IV SOLN
INTRAVENOUS | Status: AC
  Filled 2018-04-09: qty 5

## 2018-04-09 NOTE — Progress Notes (Signed)
Survivorship Care Plan visit completed.  Treatment summary reviewed and given to patient.  ASCO answers booklet reviewed and given to patient.  CARE program and Cancer Transitions discussed with patient along with other resources cancer center offers to patients and caregivers.  Patient verbalized understanding.    

## 2018-04-19 ENCOUNTER — Ambulatory Visit
Admission: RE | Admit: 2018-04-19 | Discharge: 2018-04-19 | Disposition: A | Discharge status: Home / Self Care | Payer: Medicare Other | Source: Ambulatory Visit | Attending: Oncology | Admitting: Oncology

## 2018-04-19 ENCOUNTER — Other Ambulatory Visit: Payer: Self-pay

## 2018-04-19 DIAGNOSIS — Z170 Estrogen receptor positive status [ER+]: Secondary | ICD-10-CM

## 2018-04-19 DIAGNOSIS — C50412 Malignant neoplasm of upper-outer quadrant of left female breast: Secondary | ICD-10-CM

## 2018-04-19 HISTORY — DX: Personal history of antineoplastic chemotherapy: Z92.21

## 2018-04-19 HISTORY — DX: Personal history of irradiation: Z92.3

## 2018-05-28 ENCOUNTER — Inpatient Hospital Stay (HOSPITAL_BASED_OUTPATIENT_CLINIC_OR_DEPARTMENT_OTHER): Payer: Medicare Other | Admitting: Oncology

## 2018-05-28 ENCOUNTER — Other Ambulatory Visit: Payer: Self-pay

## 2018-05-28 ENCOUNTER — Inpatient Hospital Stay: Payer: Medicare Other | Attending: Oncology

## 2018-05-28 DIAGNOSIS — L739 Follicular disorder, unspecified: Secondary | ICD-10-CM | POA: Diagnosis not present

## 2018-05-28 DIAGNOSIS — Z95828 Presence of other vascular implants and grafts: Secondary | ICD-10-CM

## 2018-05-28 DIAGNOSIS — Z79811 Long term (current) use of aromatase inhibitors: Secondary | ICD-10-CM | POA: Diagnosis not present

## 2018-05-28 DIAGNOSIS — C50412 Malignant neoplasm of upper-outer quadrant of left female breast: Secondary | ICD-10-CM | POA: Insufficient documentation

## 2018-05-28 DIAGNOSIS — Z170 Estrogen receptor positive status [ER+]: Secondary | ICD-10-CM | POA: Diagnosis not present

## 2018-05-28 DIAGNOSIS — L0292 Furuncle, unspecified: Secondary | ICD-10-CM

## 2018-05-28 MED ORDER — MUPIROCIN CALCIUM 2 % EX CREA: 1.0000 "application " | TOPICAL_CREAM | Freq: Two times a day (BID) | CUTANEOUS | 0 refills | Status: DC

## 2018-05-28 MED ORDER — SODIUM CHLORIDE 0.9% FLUSH
10.0000 mL | Freq: Once | INTRAVENOUS | Status: AC
  Administered 2018-05-28: 10 mL via INTRAVENOUS
  Filled 2018-05-28: qty 10

## 2018-05-28 MED ORDER — HEPARIN SOD (PORK) LOCK FLUSH 100 UNIT/ML IV SOLN
500.0000 [IU] | Freq: Once | INTRAVENOUS | Status: AC
  Administered 2018-05-28: 500 [IU] via INTRAVENOUS

## 2018-05-28 MED ORDER — DOXYCYCLINE HYCLATE 100 MG PO TABS: 100.0000 mg | ORAL_TABLET | Freq: Two times a day (BID) | ORAL | 0 refills | Status: DC

## 2018-05-28 NOTE — Progress Notes (Signed)
Symptom Management Consult note [facility]  [phone] [phone]  Patient Care Team: [name], MD as PCP - General (Internal Medicine) Rico Junker, RN as Oncology Nurse Navigator Grayland Ormond, Kathlene November, MD as Consulting Physician (Oncology) Vickie Epley, MD as Consulting Physician (General Surgery) Noreene Filbert, MD as Referring Physician (Radiation Oncology) Teodoro Spray, MD as Consulting Physician (Cardiology)   Name of the patient: Briana [name]  [id number]  Mar 13, 1965   Date of visit: 05/28/2018  Diagnosis: Stage Ia ER PR positive HER-2 positive invasive carcinoma of upper outer quadrant of left breast.  Chief Complaint: Boil under left breast   Current Treatment: s/p 7 cycles Herceptin.  Completed in July 2019.  Discontinued early due to worsening EF.  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  Patient initiated adjuvant chemotherapy with Taxol and Herceptin on March 13, 2017.  She completed Taxol on May 29, 2017.  Patient then received 4 additional cycles of Herceptin maintenance treatment, but this was permanently discontinued on August 21, 2017 secondary to persistently low EF on MUGA scan.  Continue letrozole for a total of 5 years completing treatment in July 2024     Malignant neoplasm of upper-outer quadrant of left breast in female, estrogen receptor positive ([place])   02/07/2017 Initial Diagnosis    Malignant neoplasm of upper-outer quadrant of left breast in female, estrogen receptor positive ([place])     Subjective Data:53 year old female [facility] for evaluation of a left breast boil.  Symptoms began approximately 1 week ago and have worsened since that time.  Admits to prior boils on different locations of her body.  States she  usually applies acne cream with complete resolution.  Over the course of the past 24 hours, reports worsening sensitivity and pain.  Feels "that is coming to ahead".  Wanted to make sure it was not infected.  Denies fevers and interval infection.  Denies shortness of breath, chest pain, constipation or diarrhea.  She was previously evaluated by Dr. Grayland Ormond on 04/09/2018 for routine 90-monthevaluation.  Appeared to have some orthostatic hypotension with near syncopal episode a few days prior.  Tolerating letrozole with only mild joint pain.  Plan was for follow-up in approximately 6 months.  Orders placed for mammogram in the next 1 to 2 weeks.  Had mammogram completed on 04/19/2018 with recommendations for repeat mammogram in in approximately 1 year.  ECOG: 1 - Symptomatic but completely ambulatory   Physical Exam Constitutional:      Appearance: Normal appearance.  HENT:     Head: Normocephalic and atraumatic.  Eyes:     Pupils: Pupils are equal, round, and reactive to light.  Neck:     Musculoskeletal: Normal range of motion.  Cardiovascular:     Rate and Rhythm: Normal rate and regular rhythm.     Heart sounds: Normal heart sounds. No murmur.  Pulmonary:     Effort: Pulmonary effort is normal.     Breath sounds: Normal breath sounds. No wheezing.  Abdominal:     General: Bowel sounds are normal. There is no distension.     Palpations: Abdomen is soft.     Tenderness: There is no abdominal tenderness.  Musculoskeletal: Normal range of motion.  Skin:    General: Skin is warm and dry.     Findings:  Lesion present. No rash.     Comments: Boil under left breast   Neurological:     Mental Status: She is alert and oriented to person, place, and time.  Psychiatric:        Judgment: Judgment normal.    Review of Systems  Constitutional: Negative.  Negative for chills, fever, malaise/fatigue and weight loss.  HENT: Negative for congestion, ear pain and tinnitus.   Eyes: Negative.   Negative for blurred vision and double vision.  Respiratory: Negative.  Negative for cough, sputum production and shortness of breath.   Cardiovascular: Negative.  Negative for chest pain, palpitations and leg swelling.  Gastrointestinal: Negative.  Negative for abdominal pain, constipation, diarrhea, nausea and vomiting.  Genitourinary: Negative for dysuria, frequency and urgency.  Musculoskeletal: Negative for back pain and falls.  Skin: Positive for rash.       Boil under left breast   Neurological: Negative.  Negative for weakness and headaches.  Endo/Heme/Allergies: Negative.  Does not bruise/bleed easily.  Psychiatric/Behavioral: Negative.  Negative for depression. The patient is not nervous/anxious and does not have insomnia.      Lab Results  Component Value Date   WBC 8.3 09/11/2017   HGB 14.1 09/11/2017   HCT 40.8 09/11/2017   MCV 98.6 09/11/2017   PLT 261 09/11/2017     Chemistry      Component Value Date/Time   NA 141 09/11/2017 1418   K 3.8 09/11/2017 1418   CL 109 09/11/2017 1418   CO2 21 (L) 09/11/2017 1418   BUN 25 (H) 09/11/2017 1418   CREATININE 1.04 (H) 09/11/2017 1418   CREATININE 1.01 03/14/2012 1201      Component Value Date/Time   CALCIUM 9.5 09/11/2017 1418   ALKPHOS 80 09/11/2017 1418   AST 22 09/11/2017 1418   ALT 16 09/11/2017 1418   BILITOT 0.3 09/11/2017 1418     Imaging  Mammogram 04/19/2018 RECOMMENDATION: Diagnostic mammogram is suggested in 1 year. (Code:[id number]-B-01Y)  Plan Labs today. Assessment.  Patient afebrile.  Vital signs stable. No signs of sepsis.   Folliculitis: States she gets them often but typically resolve with "acne medication".  No fluctuant head present.  Vast erythema, warmth and induration surrounding boil.  Worrisome for early cellulitis.   Patient instructed to place warm compact on affected area 3 times daily. RX Topical mupirocin ointment BID.  Rx doxycycline 100 mg tablets twice daily x7 days.  Patient reports  anaphylaxis to cephalosporins.  Will try doxycycline and call patient in the next couple days to evaluate.  Dispo: RTC in 1 week to re-assess.   Greater than 50% was spent in counseling and coordination of care with this patient including but not limited to discussion of the relevant topics above (See A&P) including, but not limited to diagnosis and management of acute and chronic medical conditions.   Faythe Casa, NP 05/28/2018 3:26 PM

## 2018-06-01 ENCOUNTER — Other Ambulatory Visit: Payer: Self-pay | Admitting: Oncology

## 2018-07-16 ENCOUNTER — Other Ambulatory Visit: Payer: Self-pay

## 2018-07-16 ENCOUNTER — Inpatient Hospital Stay: Payer: Medicare Other | Attending: Oncology

## 2018-07-16 DIAGNOSIS — Z95828 Presence of other vascular implants and grafts: Secondary | ICD-10-CM

## 2018-07-16 DIAGNOSIS — C50412 Malignant neoplasm of upper-outer quadrant of left female breast: Secondary | ICD-10-CM | POA: Diagnosis not present

## 2018-07-16 DIAGNOSIS — Z452 Encounter for adjustment and management of vascular access device: Secondary | ICD-10-CM | POA: Insufficient documentation

## 2018-07-16 MED ORDER — SODIUM CHLORIDE 0.9% FLUSH
10.0000 mL | Freq: Once | INTRAVENOUS | Status: AC
  Administered 2018-07-16: 10 mL via INTRAVENOUS
  Filled 2018-07-16: qty 10

## 2018-07-16 MED ORDER — HEPARIN SOD (PORK) LOCK FLUSH 100 UNIT/ML IV SOLN
500.0000 [IU] | Freq: Once | INTRAVENOUS | Status: AC
  Administered 2018-07-16: 500 [IU] via INTRAVENOUS

## 2018-08-31 ENCOUNTER — Other Ambulatory Visit: Payer: Self-pay

## 2018-09-03 ENCOUNTER — Other Ambulatory Visit: Payer: Self-pay

## 2018-09-03 ENCOUNTER — Inpatient Hospital Stay: Payer: Medicare Other | Attending: Oncology

## 2018-09-03 DIAGNOSIS — C50412 Malignant neoplasm of upper-outer quadrant of left female breast: Secondary | ICD-10-CM | POA: Diagnosis present

## 2018-09-03 DIAGNOSIS — Z452 Encounter for adjustment and management of vascular access device: Secondary | ICD-10-CM | POA: Diagnosis not present

## 2018-09-03 DIAGNOSIS — Z170 Estrogen receptor positive status [ER+]: Secondary | ICD-10-CM

## 2018-09-03 DIAGNOSIS — D500 Iron deficiency anemia secondary to blood loss (chronic): Secondary | ICD-10-CM

## 2018-09-03 MED ORDER — SODIUM CHLORIDE 0.9% FLUSH
10.0000 mL | Freq: Once | INTRAVENOUS | Status: AC
  Administered 2018-09-03: 10 mL via INTRAVENOUS
  Filled 2018-09-03: qty 10

## 2018-09-03 MED ORDER — HEPARIN SOD (PORK) LOCK FLUSH 100 UNIT/ML IV SOLN
500.0000 [IU] | Freq: Once | INTRAVENOUS | Status: AC
  Administered 2018-09-03: 500 [IU] via INTRAVENOUS
  Filled 2018-09-03: qty 5

## 2018-09-04 ENCOUNTER — Other Ambulatory Visit: Payer: Self-pay | Admitting: Oncology

## 2018-09-17 ENCOUNTER — Ambulatory Visit
Admission: RE | Admit: 2018-09-17 | Discharge: 2018-09-17 | Disposition: A | Discharge status: Home / Self Care | Payer: Medicare Other | Source: Ambulatory Visit | Attending: Oncology | Admitting: Oncology

## 2018-09-17 ENCOUNTER — Other Ambulatory Visit: Payer: Self-pay

## 2018-09-17 DIAGNOSIS — Z170 Estrogen receptor positive status [ER+]: Secondary | ICD-10-CM | POA: Diagnosis present

## 2018-09-17 DIAGNOSIS — Z780 Asymptomatic menopausal state: Secondary | ICD-10-CM | POA: Insufficient documentation

## 2018-09-17 DIAGNOSIS — Z13820 Encounter for screening for osteoporosis: Secondary | ICD-10-CM | POA: Diagnosis not present

## 2018-09-17 DIAGNOSIS — C50412 Malignant neoplasm of upper-outer quadrant of left female breast: Secondary | ICD-10-CM | POA: Diagnosis present

## 2018-09-17 DIAGNOSIS — M8580 Other specified disorders of bone density and structure, unspecified site: Secondary | ICD-10-CM | POA: Insufficient documentation

## 2018-09-26 ENCOUNTER — Ambulatory Visit (INDEPENDENT_AMBULATORY_CARE_PROVIDER_SITE_OTHER): Payer: Medicare Other | Admitting: Surgery

## 2018-09-26 ENCOUNTER — Other Ambulatory Visit: Payer: Self-pay

## 2018-09-26 ENCOUNTER — Encounter: Payer: Self-pay | Admitting: Surgery

## 2018-09-26 VITALS — BP 107/78 | HR 102 | Temp 97.5°F | Resp 16 | Ht 63.0 in | Wt 154.0 lb

## 2018-09-26 DIAGNOSIS — C50412 Malignant neoplasm of upper-outer quadrant of left female breast: Secondary | ICD-10-CM

## 2018-09-26 NOTE — Progress Notes (Signed)
PROCEDURE NOTE 1. Excision of Right IJ port  ANESTHESIA: Lidocaine 1% w epi 9cc  EBL: minimal  Complications: none  After informed consent was obtained.  We placed the patient in the supine position and prepped and draped in the usual sterile fashion.  An infraclavicular incision was created where the previous port was placed and we excised the subcutaneous tissue.  We identified the port on incises capsule.  We remove it from adjacent structures and cut the Prolene sutures.  We remove the port after asking the patient for a Valsalva and obtain hemostasis with pressure.  The wound was closed in a 2 layer fashion with 3-0 Vicryl and 4 Monocryl for the skin.  Dermabond was used to coat the skin.  There were no complications  

## 2018-09-26 NOTE — Patient Instructions (Signed)
You may shower on Saturday 09/29/2018  Do not scrub over the incision area.   Call our office with any questions or concerns that you may have.     Implanted Port Removal Implanted port removal is a procedure to remove the port and catheter (port-a-cath) that is implanted under your skin. The port is a small disc under your skin that can be punctured with a needle. It is connected to a vein in your chest or neck by a small flexible tube (catheter). The port-a-cath is used for treatment through an IV tube and for taking blood samples. Your health care provider will remove the port-a-cath if:  You no longer need it for treatment.  It is not working properly.  The area around it gets infected.  Tell a health care provider about:  Any allergies you have.  All medicines you are taking, including vitamins, herbs, eye drops, creams, and over-the-counter medicines.  Any problems you or family members have had with anesthetic medicines.  Any blood disorders you have.  Any surgeries you have had.  Any medical conditions you have.  Whether you are pregnant or may be pregnant. What are the risks? Generally, this is a safe procedure. However, problems may occur, including:  Infection.  Bleeding.  Allergic reactions to anesthetic medicines.  Damage to nerves or blood vessels.  What happens before the procedure?  You will have: ? A physical exam. ? Blood tests. ? Imaging tests, including a chest X-ray.  Follow instructions from your health care provider about eating or drinking restrictions.  Ask your health care provider about: ? Changing or stopping your regular medicines. This is especially important if you are taking diabetes medicines or blood thinners. ? Taking medicines such as aspirin and ibuprofen. These medicines can thin your blood. Do not take these medicines before your procedure if your surgeon instructs you not to.  Ask your health care provider how your  surgical site will be marked or identified.  You may be given antibiotic medicine to help prevent infection.  Plan to have someone take you home after the procedure.  If you will be going home right after the procedure, plan to have someone stay with you for 24 hours. What happens during the procedure?  To reduce your risk of infection: ? Your health care team will wash or sanitize their hands. ? Your skin will be washed with soap.  You may be given one or more of the following: ? A medicine to help you relax (sedative). ? A medicine to numb the area (local anesthetic).  A small cut (incision) will be made at the site of your port-a-cath.  The port-a-cath and the catheter that has been inside your vein will gently be removed.  The incision will be closed with stitches (sutures), adhesive strips, or skin glue.  A bandage (dressing) will be placed over the incision. The procedure may vary among health care providers and hospitals. What happens after the procedure?  Your blood pressure, heart rate, breathing rate, and blood oxygen level will be monitored often until the medicines you were given have worn off.  Do not drive for 24 hours if you received a sedative. This information is not intended to replace advice given to you by your health care provider. Make sure you discuss any questions you have with your health care provider. Document Released: 12/29/2014 Document Revised: 06/25/2015 Document Reviewed: 10/22/2014 Elsevier Interactive Patient Education  [employer].

## 2018-10-05 NOTE — Progress Notes (Signed)
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  INTERVAL HISTORY: Patient returns to clinic today for routine 79-monthevaluation.  She is tolerating letrozole well without significant side effects.  She currently feels well and is asymptomatic. She has no neurologic complaints.  She denies any recent fevers or illnesses.  She has a good appetite and denies weight loss.  She has no chest pain, cough, hemoptysis, or shortness of breath. She denies any nausea, vomiting, constipation, or diarrhea.  She has no urinary complaints.  Patient offers no specific complaints today.  REVIEW OF SYSTEMS:   Review of Systems  Constitutional: Negative.  Negative for fever, malaise/fatigue and weight loss.  Respiratory: Negative.  Negative for cough, hemoptysis and shortness of breath.   Cardiovascular: Negative.  Negative for chest pain, palpitations and leg swelling.  Gastrointestinal: Negative.  Negative for abdominal pain.  Genitourinary: Negative.  Negative for dysuria.  Musculoskeletal: Negative.  Negative for joint pain.  Skin: Negative.  Negative for itching and rash.  Neurological: Negative.  Negative for dizziness, sensory change, focal weakness, loss of consciousness, weakness and headaches.  Psychiatric/Behavioral: Negative.  The patient is not nervous/anxious and does not have insomnia.     As  per HPI. Otherwise, a complete review of systems is negative.  PAST MEDICAL HISTORY: Past Medical History:  Diagnosis Date  . Abnormal Pap smear of cervix    01/2015 ascus/neg- 04/2015 ascus/neg  . Allergy   . Anxiety   . Arthritis   . Asthma   . Chronic kidney disease   . COPD (chronic obstructive pulmonary disease) ([place]   . DDD (degenerative disc disease), lumbar   . Depression   . Elevated lipids   . Fibromyalgia   . Fibromyalgia   . Genetic testing 03/09/2017   Multi-Cancer panel (83 genes) @ [facility] - No pathogenic mutations detected  . GERD (gastroesophageal reflux disease)   . History of IBS   . Hyperthyroidism   . Joint disease   . Migraine   . Migraine   . Oxygen deficiency   . Personal history of chemotherapy   . Personal history of radiation therapy   . Restless leg syndrome   . Sleep apnea    Does not use C-PAP, cannot tolerate mask    PAST SURGICAL HISTORY: Past Surgical History:  Procedure Laterality Date  . ABDOMINAL HYSTERECTOMY  2008  . BREAST BIOPSY Left 02/02/2017   u[place]ore positive  . BREAST LUMPECTOMY Left 02/17/2017  . BREAST LUMPECTOMY WITH NEEDLE LOCALIZATION Left 02/17/2017   Procedure: BREAST LUMPECTOMY WITH NEEDLE LOCALIZATION;  Surgeon: DVickie Epley MD;  Location: [facility];  Service: General;  Laterality: Left;  . CARPAL TUNNEL RELEASE Bilateral   . cryotherapy    . DILATION AND CURETTAGE OF UTERUS    . ENDOMETRIAL ABLATION    . LAPAROSCOPIC OOPHERECTOMY Left    unsure which side but thinks its the left  . LYSIS OF ADHESION Left 10/14/2015   Procedure: LYSIS OF ADHESION;  Surgeon: Thornton Park, MD;  Location: [facility];  Service: Orthopedics;  Laterality: Left;  . NECK SURGERY     lower neck fusion rods and screws  . OOPHORECTOMY     one ovary removed   . PORTA CATH INSERTION N/A 03/08/2017   Procedure: PORTA CATH INSERTION;  Surgeon: Katha Cabal, MD;  Location: [facility];  Service: Cardiovascular;  Laterality:  N/A;  . RESECTION DISTAL CLAVICAL Left 10/14/2015   Procedure: RESECTION DISTAL CLAVICAL;  Surgeon: Thornton Park, MD;  Location: [facility];  Service: Orthopedics;  Laterality: Left;  . SENTINEL NODE BIOPSY Left 02/17/2017   Procedure: SENTINEL NODE BIOPSY;  Surgeon: Vickie Epley, MD;  Location: [facility];  Service: General;  Laterality: Left;  . SHOULDER ARTHROSCOPY WITH OPEN ROTATOR CUFF REPAIR Left 10/14/2015   Procedure: SHOULDER ARTHROSCOPY WITH OPEN ROTATOR CUFF REPAIR;  Surgeon: Thornton Park, MD;  Location: [facility];  Service: Orthopedics;  Laterality: Left;  . SHOULDER ARTHROSCOPY WITH OPEN ROTATOR CUFF REPAIR AND DISTAL CLAVICLE ACROMINECTOMY Right 09/03/2014   Procedure: RIGHT SHOULDER ARTHROSCOPY WITH MINI OPEN ROTATOR CUFF TEAR;  Surgeon: Thornton Park, MD;  Location: [facility];  Service: Orthopedics;  Laterality: Right;  biceps tenodesis, arthroscopic subacromial decompression and distal clavicle incision  . spinal injections    . SUBACROMIAL DECOMPRESSION Left 10/14/2015   Procedure: SUBACROMIAL DECOMPRESSION;  Surgeon: Thornton Park, MD;  Location: [facility];  Service: Orthopedics;  Laterality: Left;    FAMILY HISTORY: Family History  Problem Relation Age of Onset  . Breast cancer Mother 36       Glioblastoma also; deceased at 79  . Diabetes Father   . Lung cancer Father        mesothelioma; deceased 66s  . Cancer Maternal Aunt        "bone ca"; unk. primary  . Colon cancer Maternal Grandfather        dx in 43s; deceased 6  . Colon cancer Maternal Aunt        dx in 90s; currently 11s  . Lung cancer Maternal Grandmother        smoker; deceased 53  . Lung cancer Paternal Grandmother        smoker; deceased 21s  . Breast cancer Cousin        dx 42s; daughter of mat aunt with unk. primary cancer  . Cancer Other        distant cousin; unknown primary  . Colon cancer Other        dx 60s; currently 21; maternal half-sister  . Bipolar disorder Sister   .  Schizophrenia Sister     ADVANCED DIRECTIVES (Y/N):  N  HEALTH MAINTENANCE: Social History   Tobacco Use  . Smoking status: Current Every Day Smoker    Packs/day: 1.00    Types: Cigarettes  . Smokeless tobacco: Never Used  Substance Use Topics  . Alcohol use: No    Alcohol/week: 0.0 standard drinks  . Drug use: No     Colonoscopy:  PAP:  Bone density:  Lipid panel:  Allergies  Allergen Reactions  . Iodine   . Bee Venom Hives and Rash  . Cefoxitin Rash  . Cefuroxime Axetil Hives and Rash  . Cucumber Extract Rash  . Gadolinium Derivatives Swelling, Other (See Comments) and Rash    NECK BECAME RED AND TONGUE WAS SWELLING SLIGHTLY PER PT NECK BECAME RED AND TONGUE WAS SWELLING SLIGHTLY PER PT NECK BECAME RED AND TONGUE WAS SWELLING SLIGHTLY PER PT  .  Iodinated Diagnostic Agents Hives and Rash  . Latex Rash  . Other Hives and Rash    Bee Stings-swelling/hives/rash Wool (textile fiber)-Rash/itching  . Tomato Rash    Red tomatoes    Current Outpatient Medications  Medication Sig Dispense Refill  . anastrozole (ARIMIDEX) 1 MG tablet Take 1 tablet by mouth once daily 90 tablet 3  . baclofen (LIORESAL) 10 MG tablet Take 10 mg by mouth 3 (three) times daily as needed for muscle spasms (typically 2-3 times daily).     . calcium carbonate (CALCIUM 600) 600 MG TABS tablet Take by mouth.    . cholecalciferol (VITAMIN D) 1000 UNITS tablet Take 1,000 Units by mouth 2 (two) times daily.     . clindamycin-benzoyl peroxide (BENZACLIN) gel APPLY A SMALL AMOUNT TO AFFECTED SKIN LESION TWICE DAILY  3  . EPINEPHrine (EPIPEN JR) 0.15 MG/0.3ML injection Inject 0.15 mg into the muscle as needed for anaphylaxis.    . hydrocortisone 2.5 % cream Apply 1 application topically 2 (two) times daily as needed (for itchy/dry skin.).    Marland Kitchen hydrOXYzine (ATARAX/VISTARIL) 25 MG tablet Take 25 mg by mouth at bedtime.    Marland Kitchen ketoconazole (NIZORAL) 2 % shampoo     . magnesium oxide (MAG-OX) 400 MG tablet  Take 400 mg by mouth daily. Reported on 07/08/2015    . oxyCODONE 10 MG TABS Take 1 tablet (10 mg total) by mouth every 4 (four) hours as needed for breakthrough pain. (Patient taking differently: Take 15 mg by mouth every 4 (four) hours as needed for breakthrough pain. ) 60 tablet 0  . promethazine (PHENERGAN) 25 MG tablet Take 25 mg by mouth every 6 (six) hours as needed for nausea or vomiting.    . rizatriptan (MAXALT-MLT) 10 MG disintegrating tablet Take 10 mg by mouth as needed for migraine. May repeat in 2 hours if needed    . rosuvastatin (CRESTOR) 40 MG tablet Take 40 mg by mouth at bedtime.     . topiramate (TOPAMAX) 100 MG tablet Take 200 mg by mouth daily.     . vitamin E (VITAMIN E) 200 UNIT capsule Take 200 Units by mouth 4 (four) times a week. Take 4-5 times a week    . mupirocin cream (BACTROBAN) 2 % Apply 1 application topically 2 (two) times daily. Apply to area under left breast. (Patient not taking: Reported on 10/17/2018) 15 g 0   No current facility-administered medications for this visit.    Facility-Administered Medications Ordered in Other Visits  Medication Dose Route Frequency Provider Last Rate Last Dose  . heparin lock flush 100 unit/mL  500 Units Intravenous Once Lloyd Huger, MD      . sodium chloride flush (NS) 0.9 % injection 10 mL  10 mL Intravenous PRN Lloyd Huger, MD   10 mL at 08/21/17 1057    OBJECTIVE: Vitals:   10/18/18 1351  BP: 108/68  Resp: 18     Body mass index is 26.93 kg/m.    ECOG FS:0 - Asymptomatic  General: Well-developed, well-nourished, no acute distress. Eyes: Pink conjunctiva, anicteric sclera. HEENT: Normocephalic, moist mucous membranes. Breast: Bilateral breast and axilla without lumps or masses. Lungs: Clear to auscultation bilaterally. Heart: Regular rate and rhythm. No rubs, murmurs, or gallops. Abdomen: Soft, nontender, nondistended. No organomegaly noted, normoactive bowel sounds. Musculoskeletal: No edema,  cyanosis, or clubbing. Neuro: Alert, answering all questions appropriately. Cranial nerves grossly intact. Skin: No rashes or petechiae noted. Psych: Normal affect.  LAB RESULTS:  Lab Results  Component Value Date   NA 141 09/11/2017   K 3.8 09/11/2017   CL 109 09/11/2017   CO2 21 (L) 09/11/2017   GLUCOSE 84 09/11/2017   BUN 25 (H) 09/11/2017   CREATININE 1.04 (H) 09/11/2017   CALCIUM 9.5 09/11/2017   PROT 7.5 09/11/2017   ALBUMIN 4.0 09/11/2017   AST 22 09/11/2017   ALT 16 09/11/2017   ALKPHOS 80 09/11/2017   BILITOT 0.3 09/11/2017   GFRNONAA >60 09/11/2017   GFRAA >60 09/11/2017    Lab Results  Component Value Date   WBC 8.3 09/11/2017   NEUTROABS 6.2 09/11/2017   HGB 14.1 09/11/2017   HCT 40.8 09/11/2017   MCV 98.6 09/11/2017   PLT 261 09/11/2017     STUDIES: No results found.  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  PLAN:    1. 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  Patient initiated adjuvant chemotherapy with Taxol and Herceptin on March 13, 2017.  She completed Taxol on May 29, 2017.  Patient then received 4 additional cycles of Herceptin maintenance treatment, but this was permanently discontinued on August 21, 2017 secondary to persistently low EF on MUGA scan.  Continue letrozole for a total of 5 years completing treatment in July 2024.  Her most recent mammogram on April 19, 2018 was reported as BI-RADS 2.  Repeat in March 2021.  Return to clinic in 6 months for routine evaluation.     2.  Genetic testing: Negative.  No further interventions are needed. 3.  Joint pain: Patient does not complain of this today. 4.  Anxiety: Continue evaluation and treatment per psychiatry. 5.  Chronic pain: Patient does not complain of this today.   Continue appointments with pain clinic as scheduled. 6.  Reduced ejection fraction: Herceptin has been discontinued permanently.  Continue work-up and evaluation per cardiology.  Most recent cardiac echo revealed EF of approximately 50%.   7.  Osteopenia: Bone mineral density completed on April 17, 2018 revealed a T score of -1.9 which is essentially unchanged from 1 year prior where the T score was reported -1.8.  Continue calcium and vitamin D supplementation.  Repeat in August 2021.     Patient expressed understanding and was in agreement with this plan. She also understands that She can call clinic at any time with any questions, concerns, or complaints.   Cancer Staging Malignant neoplasm of upper-outer quadrant of left breast in female, estrogen receptor positive ([place]) Staging form: Breast, AJCC 8th Edition - Clinical stage from 02/07/2017: Stage IA (cT1c, cN0, cM0, G1, ER: Positive, PR: Positive, HER2: Positive) - Signed by Lloyd Huger, MD on 02/11/2017   Lloyd Huger, MD   10/18/2018 4:44 PM

## 2018-10-06 ENCOUNTER — Other Ambulatory Visit: Payer: Self-pay | Admitting: Oncology

## 2018-10-17 ENCOUNTER — Encounter: Payer: Self-pay | Admitting: Oncology

## 2018-10-17 ENCOUNTER — Other Ambulatory Visit: Payer: Self-pay

## 2018-10-17 NOTE — Progress Notes (Signed)
Pre-visit assessment completed prior to [place] clinic appointment on 10/18/2018 with Dr. Grayland Ormond.  Pt has no concerns at this time.

## 2018-10-18 ENCOUNTER — Inpatient Hospital Stay: Payer: Medicare Other | Attending: Oncology | Admitting: Oncology

## 2018-10-18 ENCOUNTER — Other Ambulatory Visit: Payer: Self-pay

## 2018-10-18 VITALS — BP 108/68 | Resp 18 | Wt 152.0 lb

## 2018-10-18 DIAGNOSIS — Z803 Family history of malignant neoplasm of breast: Secondary | ICD-10-CM | POA: Diagnosis not present

## 2018-10-18 DIAGNOSIS — Z170 Estrogen receptor positive status [ER+]: Secondary | ICD-10-CM | POA: Diagnosis not present

## 2018-10-18 DIAGNOSIS — Z801 Family history of malignant neoplasm of trachea, bronchus and lung: Secondary | ICD-10-CM | POA: Diagnosis not present

## 2018-10-18 DIAGNOSIS — G4730 Sleep apnea, unspecified: Secondary | ICD-10-CM | POA: Insufficient documentation

## 2018-10-18 DIAGNOSIS — F17210 Nicotine dependence, cigarettes, uncomplicated: Secondary | ICD-10-CM | POA: Diagnosis not present

## 2018-10-18 DIAGNOSIS — J449 Chronic obstructive pulmonary disease, unspecified: Secondary | ICD-10-CM | POA: Diagnosis not present

## 2018-10-18 DIAGNOSIS — F419 Anxiety disorder, unspecified: Secondary | ICD-10-CM | POA: Diagnosis not present

## 2018-10-18 DIAGNOSIS — E0590 Thyrotoxicosis, unspecified without thyrotoxic crisis or storm: Secondary | ICD-10-CM | POA: Diagnosis not present

## 2018-10-18 DIAGNOSIS — K589 Irritable bowel syndrome without diarrhea: Secondary | ICD-10-CM | POA: Insufficient documentation

## 2018-10-18 DIAGNOSIS — C50412 Malignant neoplasm of upper-outer quadrant of left female breast: Secondary | ICD-10-CM

## 2018-10-18 DIAGNOSIS — M8580 Other specified disorders of bone density and structure, unspecified site: Secondary | ICD-10-CM | POA: Diagnosis not present

## 2018-10-18 DIAGNOSIS — Z923 Personal history of irradiation: Secondary | ICD-10-CM | POA: Insufficient documentation

## 2018-10-18 DIAGNOSIS — Z79899 Other long term (current) drug therapy: Secondary | ICD-10-CM | POA: Insufficient documentation

## 2018-10-18 DIAGNOSIS — Z79811 Long term (current) use of aromatase inhibitors: Secondary | ICD-10-CM | POA: Insufficient documentation

## 2018-10-18 DIAGNOSIS — Z9221 Personal history of antineoplastic chemotherapy: Secondary | ICD-10-CM | POA: Insufficient documentation

## 2018-10-18 DIAGNOSIS — N189 Chronic kidney disease, unspecified: Secondary | ICD-10-CM | POA: Insufficient documentation

## 2018-10-18 DIAGNOSIS — Z90710 Acquired absence of both cervix and uterus: Secondary | ICD-10-CM | POA: Diagnosis not present

## 2018-10-18 DIAGNOSIS — Z800 Family history of malignant neoplasm of digestive organs: Secondary | ICD-10-CM | POA: Insufficient documentation

## 2018-10-22 ENCOUNTER — Inpatient Hospital Stay: Payer: Medicare Other

## 2018-11-14 DIAGNOSIS — R42 Dizziness and giddiness: Secondary | ICD-10-CM | POA: Insufficient documentation

## 2018-11-26 DIAGNOSIS — R55 Syncope and collapse: Secondary | ICD-10-CM | POA: Insufficient documentation

## 2018-12-10 ENCOUNTER — Inpatient Hospital Stay: Payer: Medicare Other

## 2018-12-12 DIAGNOSIS — Z720 Tobacco use: Secondary | ICD-10-CM | POA: Insufficient documentation

## 2019-01-02 IMAGING — CR DG CHEST 2V
1 series · 2 of 2 positions shown · non-contrast
Comparison: None

CLINICAL DATA: Preop chest radiograph.

EXAM:
CHEST  2 VIEW

[Series 1: dg chest 2 view · 0.14mm/px · 2 of 2 slices shown]
[im 1/2]
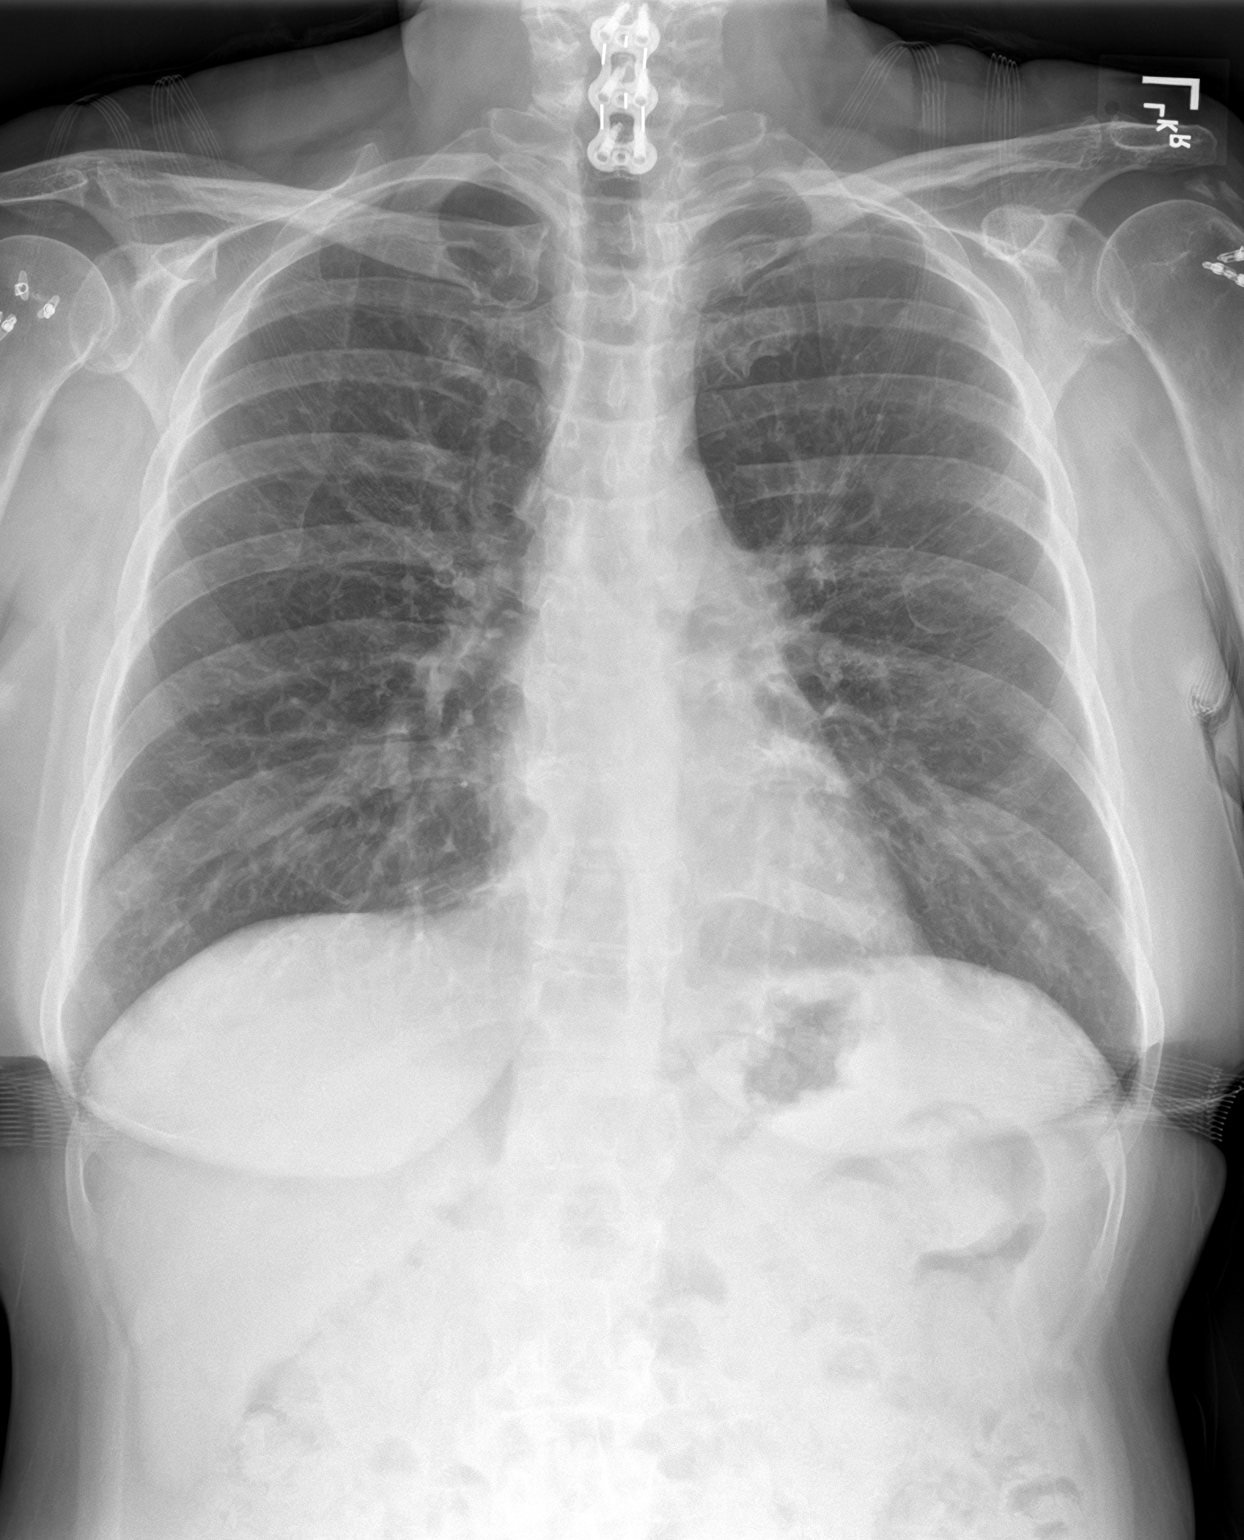
[im 2/2]
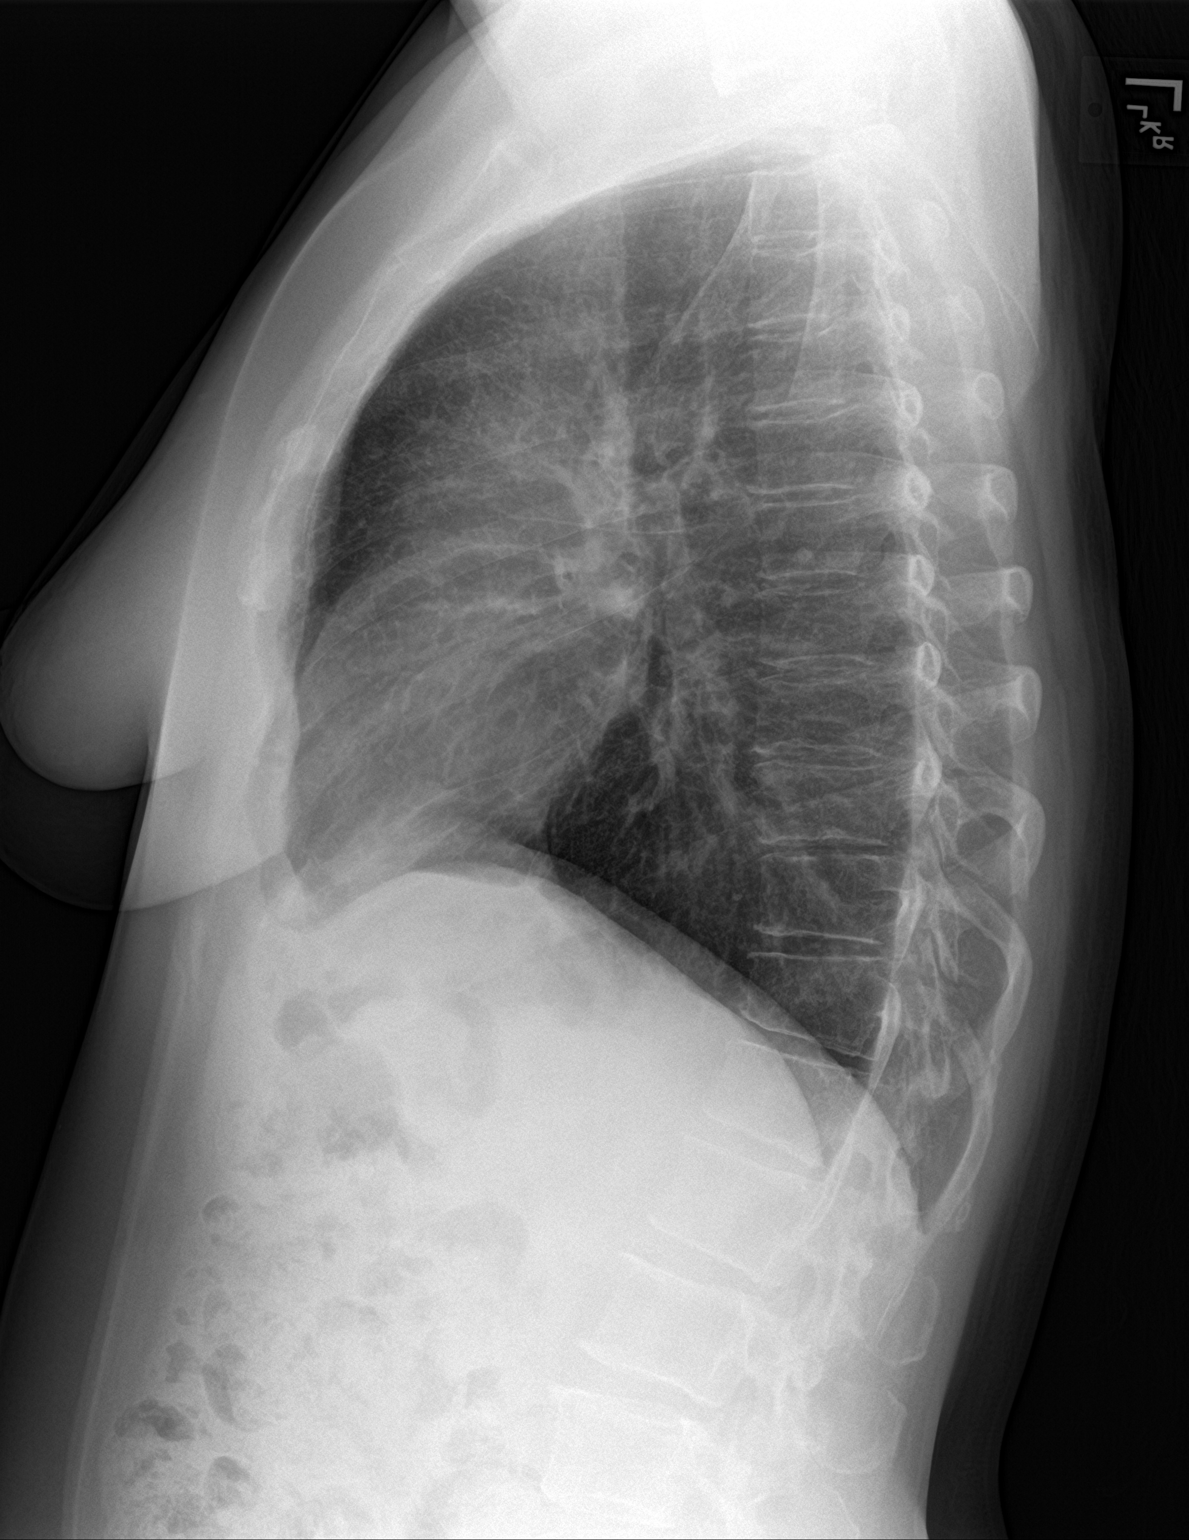

[2 of 2 positions shown; findings below may reference images not displayed]

FINDINGS: The heart size and mediastinal contours are within normal limits.
Chronic interstitial coarsening is identified throughout both lungs.
No superimposed airspace consolidation. No pulmonary edema or
pleural effusion. The visualized skeletal structures are
unremarkable.
IMPRESSION: 1. Chronic interstitial coarsening likely reflecting smoking related
changes.
2. No acute cardiopulmonary abnormality.

## 2019-01-05 IMAGING — MG MM PLC BREAST LOC DEV 1ST LESION INC MAMMO GUIDE*L*
9 of 10 series · 9 of 10 positions shown · non-contrast
Comparison: Previous exams.

CLINICAL DATA: Left breast cancer for needle localization prior to
surgery.

EXAM:
NEEDLE LOCALIZATION OF THE LEFT BREAST WITH MAMMO GUIDANCE

[L CC (1 of 5)]
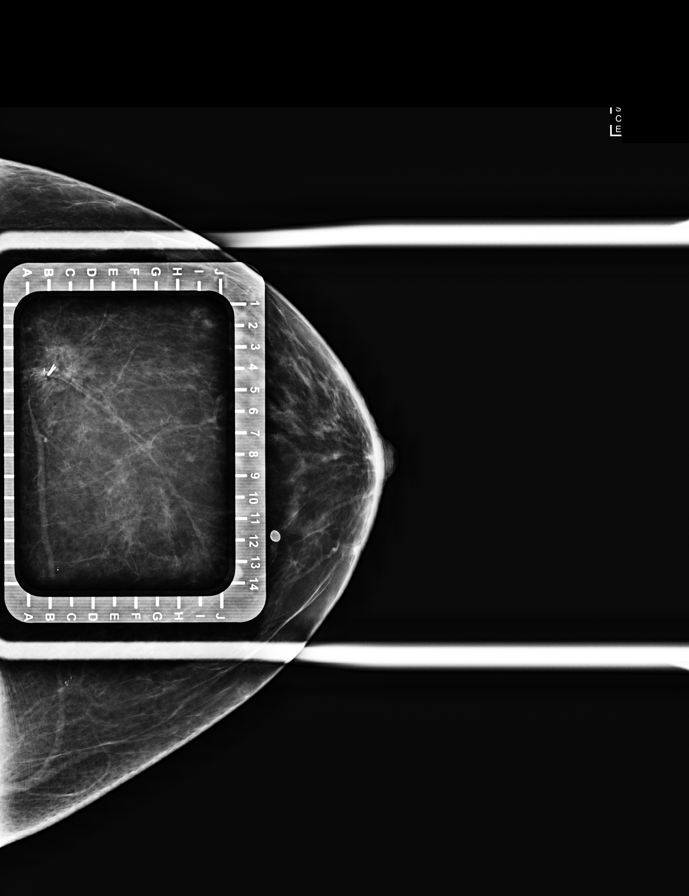

[L CC (2 of 5)]
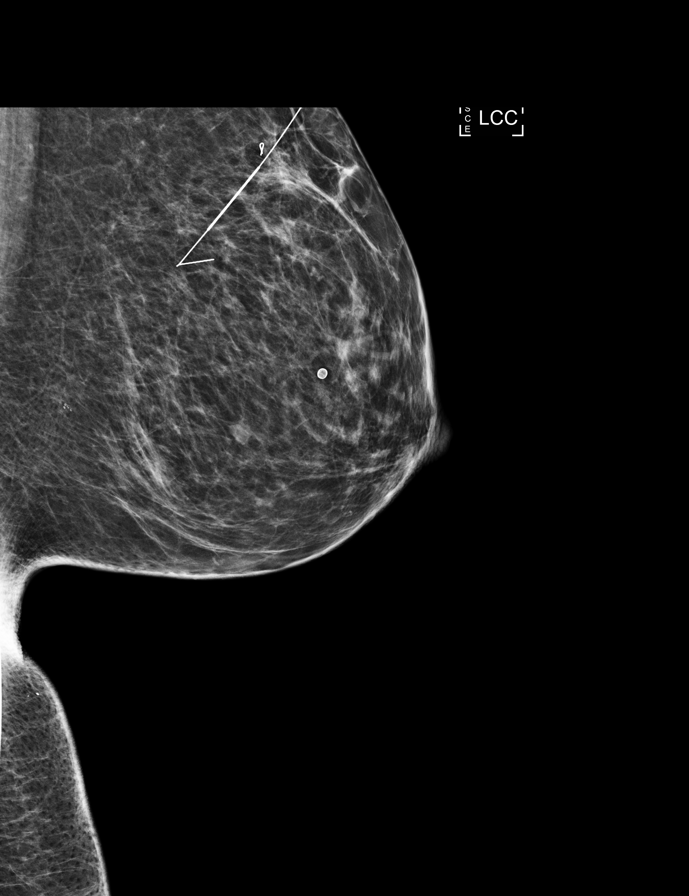

[L ML (1 of 4)]
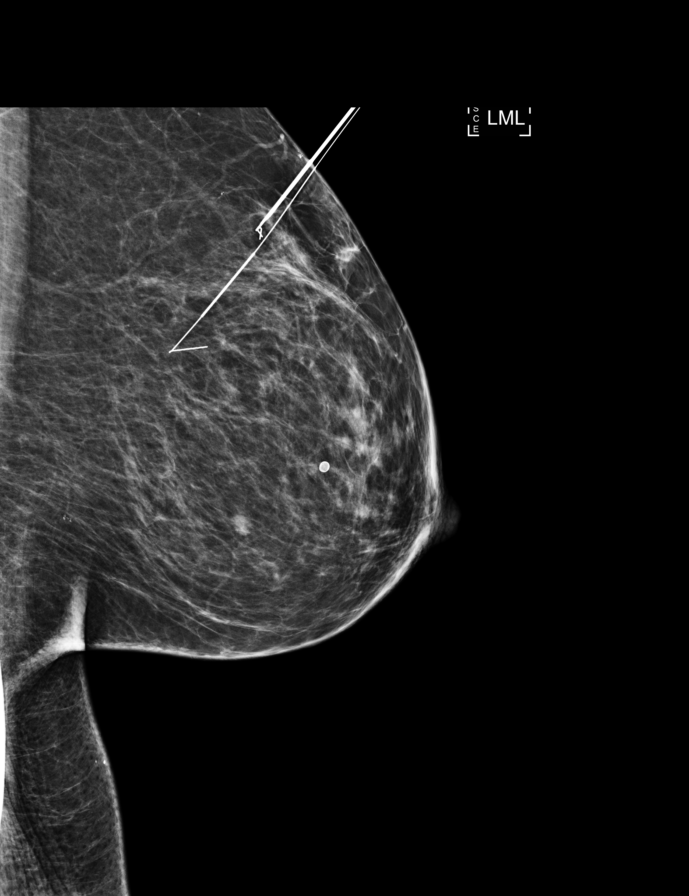

[L CC (3 of 5)]
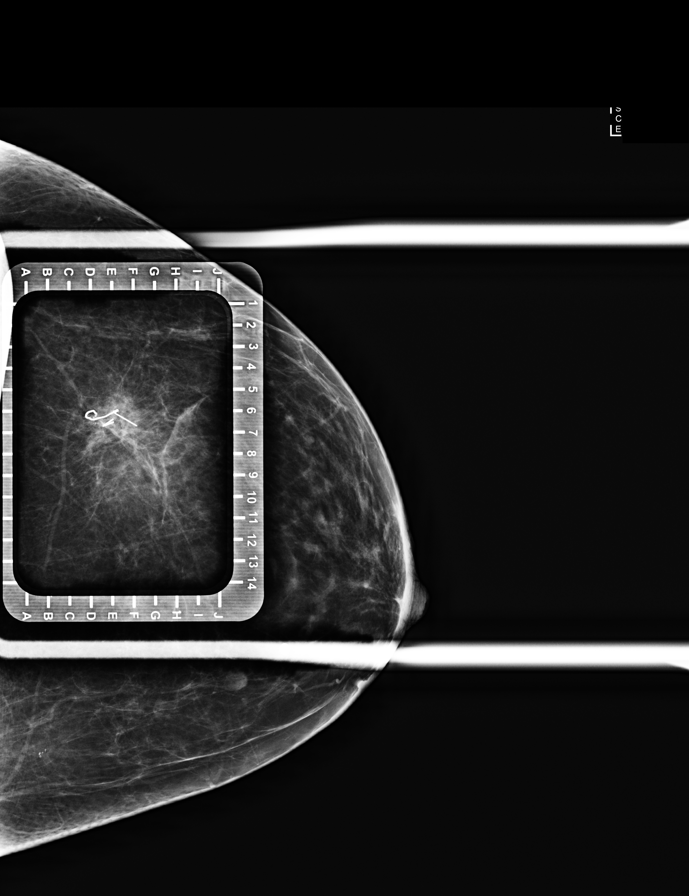

[L ML (2 of 4)]
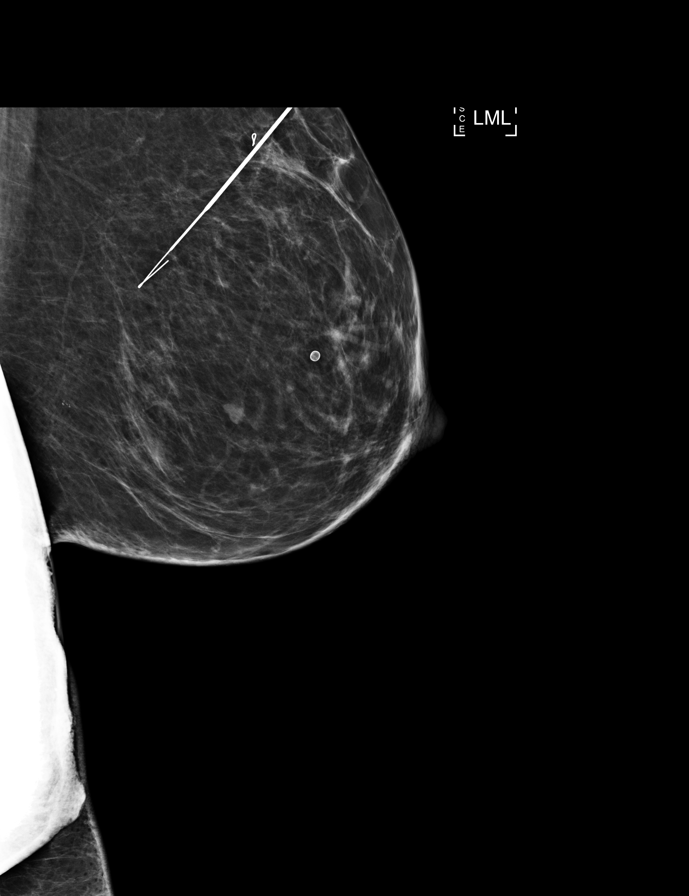

[L ML (3 of 4)]
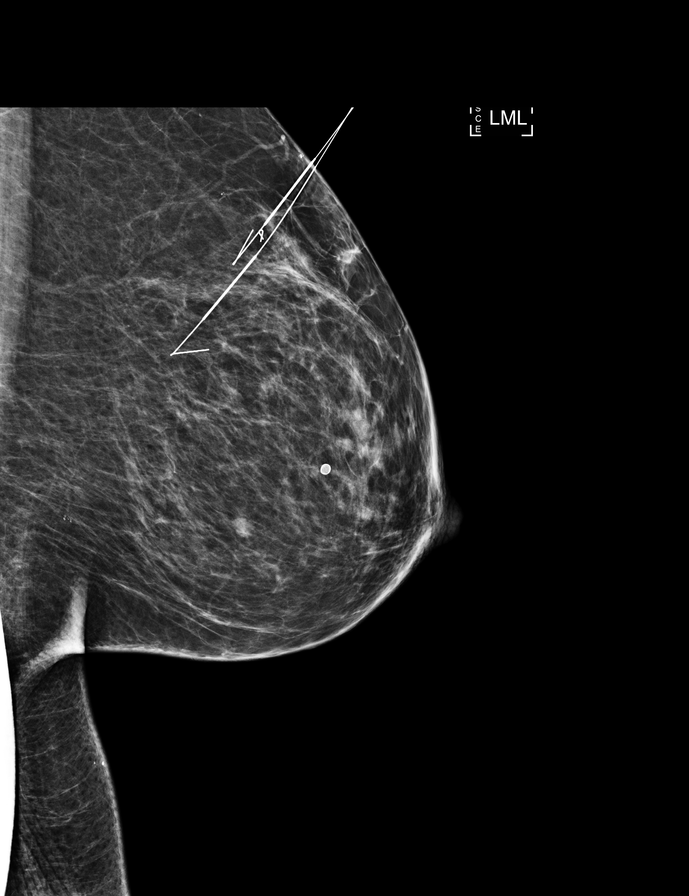

[L CC (4 of 5)]
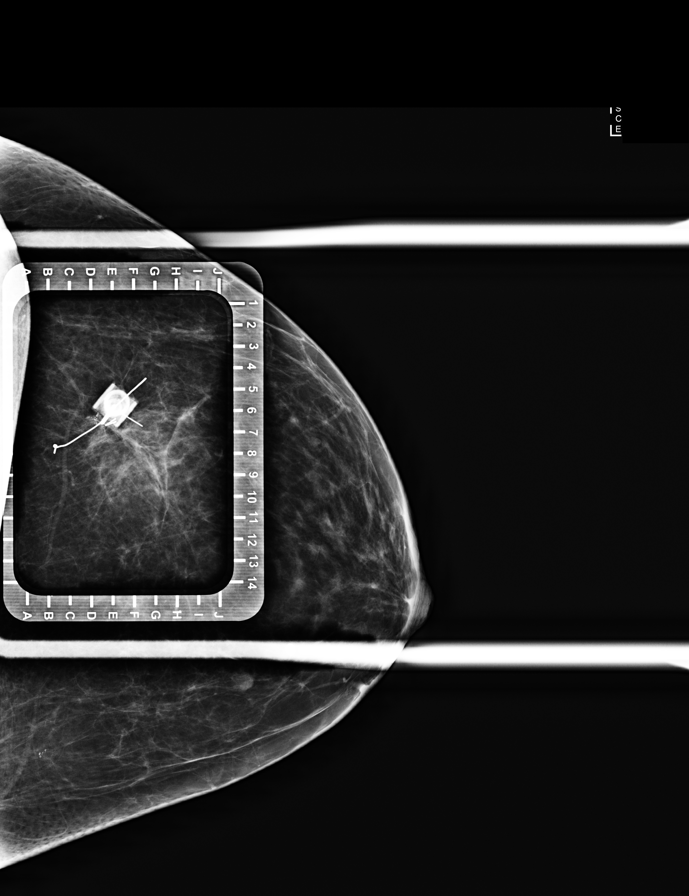

[L ML (4 of 4)]
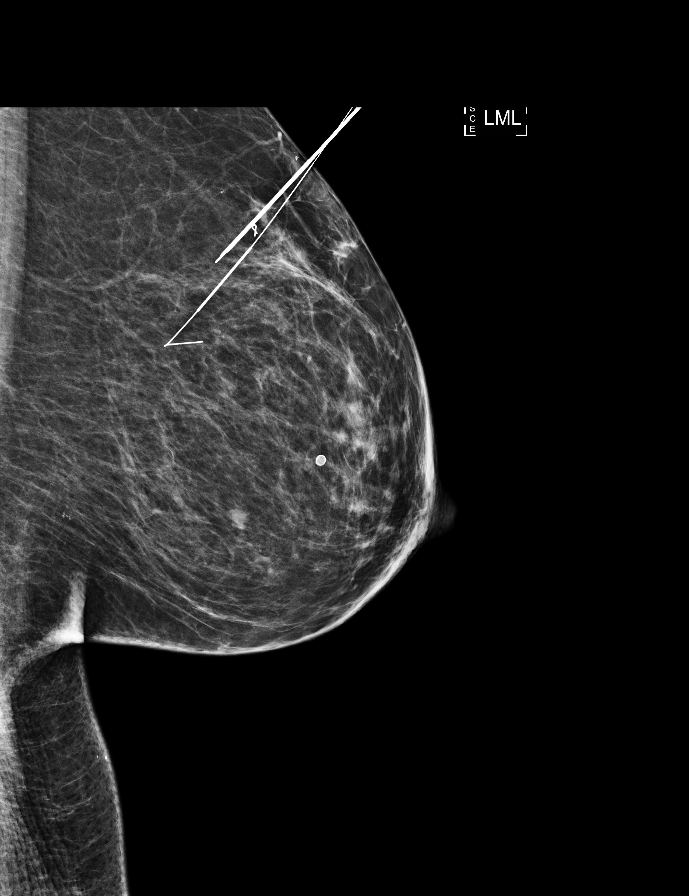

[L CC (5 of 5)]
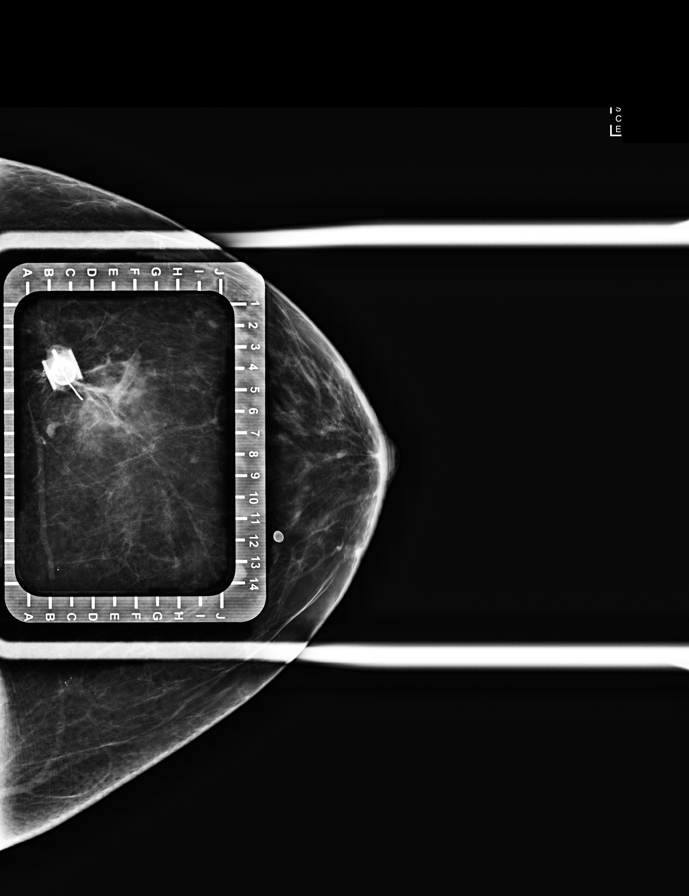

[9 of 10 positions shown; findings below may reference images not displayed]

FINDINGS: Patient presents for needle localization prior to left breast
surgery. I met with the patient and we discussed the procedure of
needle localization including benefits and alternatives. We
discussed the high likelihood of a successful procedure. We
discussed the risks of the procedure, including infection, bleeding,
tissue injury, and further surgery. Informed, written consent was
given. The usual time-out protocol was performed immediately prior
to the procedure.

Using mammographic guidance, sterile technique, 1% lidocaine and a 5
cm modified Kopans needle, ribbon biopsy clip localized using
cranial approach. The images were marked for Dr. Naydi. The first
wire that was put in was deeper than expected. Therefore, a second
wire shorter wire was put in via the same direction.
IMPRESSION: Needle localization left breast. No apparent complications.

## 2019-01-15 ENCOUNTER — Other Ambulatory Visit: Payer: Self-pay | Admitting: Neurology

## 2019-01-15 DIAGNOSIS — R42 Dizziness and giddiness: Secondary | ICD-10-CM

## 2019-01-28 ENCOUNTER — Inpatient Hospital Stay: Payer: Medicare Other

## 2019-01-29 ENCOUNTER — Ambulatory Visit
Admission: RE | Admit: 2019-01-29 | Discharge: 2019-01-29 | Disposition: A | Discharge status: Home / Self Care | Payer: Medicare Other | Source: Ambulatory Visit | Attending: Neurology | Admitting: Neurology

## 2019-01-29 ENCOUNTER — Other Ambulatory Visit: Payer: Self-pay

## 2019-01-29 DIAGNOSIS — R42 Dizziness and giddiness: Secondary | ICD-10-CM | POA: Diagnosis not present

## 2019-01-29 DIAGNOSIS — I6381 Other cerebral infarction due to occlusion or stenosis of small artery: Secondary | ICD-10-CM

## 2019-01-29 HISTORY — DX: Other cerebral infarction due to occlusion or stenosis of small artery: I63.81

## 2019-03-07 ENCOUNTER — Ambulatory Visit
Admission: RE | Admit: 2019-03-07 | Discharge: 2019-03-07 | Disposition: A | Discharge status: Home / Self Care | Payer: Medicare Other | Source: Ambulatory Visit | Attending: Radiation Oncology | Admitting: Radiation Oncology

## 2019-03-07 ENCOUNTER — Other Ambulatory Visit: Payer: Self-pay

## 2019-03-07 ENCOUNTER — Encounter: Payer: Self-pay | Admitting: Radiation Oncology

## 2019-03-07 DIAGNOSIS — Z79811 Long term (current) use of aromatase inhibitors: Secondary | ICD-10-CM | POA: Insufficient documentation

## 2019-03-07 DIAGNOSIS — Z923 Personal history of irradiation: Secondary | ICD-10-CM | POA: Diagnosis not present

## 2019-03-07 DIAGNOSIS — Z170 Estrogen receptor positive status [ER+]: Secondary | ICD-10-CM | POA: Insufficient documentation

## 2019-03-07 DIAGNOSIS — C50412 Malignant neoplasm of upper-outer quadrant of left female breast: Secondary | ICD-10-CM | POA: Diagnosis present

## 2019-03-07 NOTE — Progress Notes (Signed)
Radiation Oncology Follow up Note  Name: Briana [name]   Date:   03/07/2019 MRN:  [id number] DOB: 1965/05/07    This 54 y.o. female presents to the clinic today for 80-month follow-up status post whole breast radiation to her left breast for stage Ia triple positive invasive mammary carcinoma.  REFERRING PROVIDER: [name], MD  HPI: Patient is a 54 year old female now out 18 months having completed whole breast radiation to her left breast for stage Ia triple positive invasive mammary carcinoma.  Seen today in routine follow-up she is doing well.  She specifically denies breast tenderness cough or bone pain.  She is off all systemic treatment at this time she is had a Port-A-Cath removed.  She is currently on.  Arimidex tolerating it well without side effect.  Mammograms last March which I have reviewed were BI-RADS 2 benign she is scheduled for follow-up mammograms this March.  COMPLICATIONS OF TREATMENT: none  FOLLOW UP COMPLIANCE: keeps appointments   PHYSICAL EXAM:  BP (P) 114/78 (BP Location: Left Arm, Patient Position: Sitting)   Pulse (P) 80   Resp (P) 16   Wt (P) 150 lb 8 oz (68.3 kg)   LMP 08/24/2008   BMI (P) 26.66 kg/m  Lungs are clear to A&P cardiac examination essentially unremarkable with regular rate and rhythm. No dominant mass or nodularity is noted in either breast in 2 positions examined. Incision is well-healed. No axillary or supraclavicular adenopathy is appreciated. Cosmetic result is excellent.  Port-A-Cath has been removed.  Well-developed well-nourished patient in NAD. HEENT reveals PERLA, EOMI, discs not visualized.  Oral cavity is clear. No oral mucosal lesions are identified. Neck is clear without evidence of cervical or supraclavicular adenopathy. Lungs are clear to A&P. Cardiac examination is essentially unremarkable with regular rate and rhythm without murmur rub or thrill. Abdomen is benign with no organomegaly or masses noted. Motor sensory and DTR levels  are equal and symmetric in the upper and lower extremities. Cranial nerves II through XII are grossly intact. Proprioception is intact. No peripheral adenopathy or edema is identified. No motor or sensory levels are noted. Crude visual fields are within normal range.  RADIOLOGY RESULTS: Mammograms reviewed compatible with above-stated findings  PLAN: Present time patient is now 18 months out and is doing well with no evidence of disease.  She scheduled for mammograms in March.  She continues on Arimidex without side effect.  I have asked to see her back in 1 year for follow-up.  Patient knows to call with any concerns.  I would like to take this opportunity to thank you for allowing me to participate in the care of your patient.Noreene Filbert, MD

## 2019-03-20 DIAGNOSIS — N182 Chronic kidney disease, stage 2 (mild): Secondary | ICD-10-CM | POA: Insufficient documentation

## 2019-04-19 NOTE — Progress Notes (Signed)
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  INTERVAL HISTORY: Patient returns to clinic today for routine 10-monthevaluation.  She complains of intermittent dizziness of unclear etiology.  Recent MRI of the brain was negative.  She otherwise feels well and is asymptomatic.  She has no other neurologic complaints. She denies any recent fevers or illnesses.  She has a good appetite and denies weight loss.  She has no chest pain, cough, hemoptysis, or shortness of breath. She denies any nausea, vomiting, constipation, or diarrhea.  She has no urinary complaints.  Patient offers no further specific complaints today.  REVIEW OF SYSTEMS:   Review of Systems  Constitutional: Negative.  Negative for fever, malaise/fatigue and weight loss.  Respiratory: Negative.  Negative for cough, hemoptysis and shortness of breath.   Cardiovascular: Negative.  Negative for chest pain, palpitations and leg swelling.  Gastrointestinal: Negative.  Negative for abdominal pain.  Genitourinary: Negative.  Negative for dysuria.  Musculoskeletal: Negative.  Negative for joint pain.  Skin: Negative.  Negative for itching and rash.  Neurological: Positive for dizziness. Negative for sensory change, focal weakness, loss of consciousness, weakness and headaches.  Psychiatric/Behavioral: Negative.  The patient is not  nervous/anxious and does not have insomnia.     As per HPI. Otherwise, a complete review of systems is negative.  PAST MEDICAL HISTORY: Past Medical History:  Diagnosis Date  . Abnormal Pap smear of cervix    01/2015 ascus/neg- 04/2015 ascus/neg  . Allergy   . Anxiety   . Arthritis   . Asthma   . Breast cancer ([facility] 2019  . Chronic kidney disease   . COPD ([place] obstructive pulmonary disease) [place]   . DDD (degenerative disc disease), lumbar   . Depression   . Elevated lipids   . Fibromyalgia   . Fibromyalgia   . Genetic testing 03/09/2017   Multi-Cancer panel (83 genes) @ [facility] - No pathogenic mutations detected  . GERD (gastroesophageal reflux disease)   . History of IBS   . Hyperthyroidism   . Joint disease   . Migraine   . Migraine   . Oxygen deficiency   . Personal history of chemotherapy   . Personal history of radiation therapy   . Restless leg syndrome   . Sleep apnea    Does not use C-PAP, cannot tolerate mask    PAST SURGICAL HISTORY: Past Surgical History:  Procedure Laterality Date  . ABDOMINAL HYSTERECTOMY  2008  . BREAST BIOPSY Left 02/02/2017   u[place]ore positive  . BREAST LUMPECTOMY Left 02/17/2017  . BREAST LUMPECTOMY WITH NEEDLE LOCALIZATION Left 02/17/2017   Procedure: BREAST LUMPECTOMY WITH NEEDLE LOCALIZATION;  Surgeon: DVickie Epley MD;  Location: [facility];  Service: General;  Laterality: Left;  . CARPAL TUNNEL RELEASE Bilateral   . cryotherapy    . DILATION AND CURETTAGE OF UTERUS    . ENDOMETRIAL ABLATION    . LAPAROSCOPIC OOPHERECTOMY Left    unsure which side but thinks its  the left  . LYSIS OF ADHESION Left 10/14/2015   Procedure: LYSIS OF ADHESION;  Surgeon: Thornton Park, MD;  Location: [facility];  Service: Orthopedics;  Laterality: Left;  . NECK SURGERY     lower neck fusion rods and screws  . OOPHORECTOMY     one ovary removed   . PORTA CATH INSERTION N/A 03/08/2017   Procedure: PORTA CATH INSERTION;  Surgeon: Katha Cabal, MD;  Location: [place] [facility];  Service: Cardiovascular;  Laterality: N/A;  . RESECTION DISTAL CLAVICAL Left 10/14/2015   Procedure: RESECTION DISTAL CLAVICAL;  Surgeon: Thornton Park, MD;  Location: [facility];  Service: Orthopedics;  Laterality: Left;  . SENTINEL NODE BIOPSY Left 02/17/2017   Procedure: SENTINEL NODE BIOPSY;  Surgeon: Vickie Epley, MD;  Location: [facility];  Service: General;  Laterality: Left;  . SHOULDER ARTHROSCOPY WITH OPEN ROTATOR CUFF REPAIR Left 10/14/2015   Procedure: SHOULDER ARTHROSCOPY WITH OPEN ROTATOR CUFF REPAIR;  Surgeon: Thornton Park, MD;  Location: [facility];  Service: Orthopedics;  Laterality: Left;  . SHOULDER ARTHROSCOPY WITH OPEN ROTATOR CUFF REPAIR AND DISTAL CLAVICLE ACROMINECTOMY Right 09/03/2014   Procedure: RIGHT SHOULDER ARTHROSCOPY WITH MINI OPEN ROTATOR CUFF TEAR;  Surgeon: Thornton Park, MD;  Location: [facility];  Service: Orthopedics;  Laterality: Right;  biceps tenodesis, arthroscopic subacromial decompression and distal clavicle incision  . spinal injections    . SUBACROMIAL DECOMPRESSION Left 10/14/2015   Procedure: SUBACROMIAL DECOMPRESSION;  Surgeon: Thornton Park, MD;  Location: [facility];  Service: Orthopedics;  Laterality: Left;    FAMILY HISTORY: Family History  Problem Relation Age of Onset  . Breast cancer Mother 54       Glioblastoma also; deceased at 17  . Diabetes Father   . Lung cancer Father        mesothelioma; deceased 61s  . Cancer Maternal Aunt        "bone ca"; unk. primary  . Colon cancer Maternal Grandfather        dx in 57s; deceased 61  . Colon cancer Maternal Aunt        dx in 25s; currently 41s  . Lung cancer Maternal Grandmother        smoker; deceased 73  . Lung cancer Paternal Grandmother        smoker; deceased 47s  . Breast cancer Cousin        dx 80s; daughter of mat aunt with unk. primary cancer  . Cancer Other        distant cousin; unknown primary  . Colon cancer Other         dx 58s; currently 66; maternal half-sister  . Bipolar disorder Sister   . Schizophrenia Sister     ADVANCED DIRECTIVES (Y/N):  N  HEALTH MAINTENANCE: Social History   Tobacco Use  . Smoking status: Current Every Day Smoker    Packs/day: 1.00    Types: Cigarettes  . Smokeless tobacco: Never Used  Substance Use Topics  . Alcohol use: No    Alcohol/week: 0.0 standard drinks  . Drug use: No     Colonoscopy:  PAP:  Bone density:  Lipid panel:  Allergies  Allergen Reactions  . Iodine   . Bee Venom Hives and Rash  . Cefoxitin Rash  . Cefuroxime Axetil Hives and Rash  . Cucumber Extract Rash  . Gadolinium Derivatives Swelling, Other (See Comments) and Rash    NECK BECAME RED AND TONGUE WAS SWELLING SLIGHTLY PER PT NECK BECAME RED AND TONGUE  WAS SWELLING SLIGHTLY PER PT NECK BECAME RED AND TONGUE WAS SWELLING SLIGHTLY PER PT  . Iodinated Diagnostic Agents Hives and Rash  . Latex Rash  . Other Hives and Rash    Bee Stings-swelling/hives/rash Wool (textile fiber)-Rash/itching  . Tomato Rash    Red tomatoes    Current Outpatient Medications  Medication Sig Dispense Refill  . anastrozole (ARIMIDEX) 1 MG tablet Take 1 tablet by mouth once daily 90 tablet 3  . baclofen (LIORESAL) 10 MG tablet Take 10 mg by mouth 3 (three) times daily as needed for muscle spasms (typically 2-3 times daily).     . calcium carbonate (CALCIUM 600) 600 MG TABS tablet Take by mouth.    . cholecalciferol (VITAMIN D) 1000 UNITS tablet Take 1,000 Units by mouth 2 (two) times daily.     . DAYVIGO 10 MG TABS Take 1 tablet by mouth at bedtime.     Marland Kitchen EPINEPHrine (EPIPEN JR) 0.15 MG/0.3ML injection Inject 0.15 mg into the muscle as needed for anaphylaxis.    . hydrocortisone 2.5 % cream Apply 1 application topically 2 (two) times daily as needed (for itchy/dry skin.).    Marland Kitchen ketoconazole (NIZORAL) 2 % shampoo     . magnesium oxide (MAG-OX) 400 MG tablet Take 400 mg by mouth daily. Reported on 07/08/2015    .  Multiple Vitamins-Minerals (HAIR SKIN AND NAILS FORMULA) TABS Take 2 tablets by mouth daily.    Marland Kitchen NARCAN 4 MG/0.1ML LIQD nasal spray kit CALL 911. ADMINISTER A SINGLE SPRAY OF NARCAN IN ONE NOSTRIL. REPEAT EVERY 3 MINUTES AS NEEDED IF NO OR MINIMAL RESPONSE.    Marland Kitchen NURTEC 75 MG TBDP     . oxyCODONE 10 MG TABS Take 1 tablet (10 mg total) by mouth every 4 (four) hours as needed for breakthrough pain. (Patient taking differently: Take 15 mg by mouth every 4 (four) hours as needed for breakthrough pain. ) 60 tablet 0  . pregabalin (LYRICA) 25 MG capsule Take 1 capsule by mouth 2 (two) times daily.    . promethazine (PHENERGAN) 25 MG tablet Take 25 mg by mouth every 6 (six) hours as needed for nausea or vomiting.    . rizatriptan (MAXALT-MLT) 10 MG disintegrating tablet Take 10 mg by mouth as needed for migraine. May repeat in 2 hours if needed    . rosuvastatin (CRESTOR) 40 MG tablet Take 40 mg by mouth at bedtime.     . topiramate (TOPAMAX) 100 MG tablet Take 200 mg by mouth daily.     . vitamin E (VITAMIN E) 200 UNIT capsule Take 200 Units by mouth 4 (four) times a week. Take 4-5 times a week    . clindamycin-benzoyl peroxide (BENZACLIN) gel APPLY A SMALL AMOUNT TO AFFECTED SKIN LESION TWICE DAILY  3  . hydrOXYzine (ATARAX/VISTARIL) 25 MG tablet Take 25 mg by mouth at bedtime.    Marland Kitchen letrozole (FEMARA) 2.5 MG tablet Take 1 tablet by mouth daily.    . mupirocin cream (BACTROBAN) 2 % Apply 1 application topically 2 (two) times daily. Apply to area under left breast. (Patient not taking: Reported on 04/25/2019) 15 g 0   No current facility-administered medications for this visit.   Facility-Administered Medications Ordered in Other Visits  Medication Dose Route Frequency Provider Last Rate Last Admin  . heparin lock flush 100 unit/mL  500 Units Intravenous Once Lloyd Huger, MD      . sodium chloride flush (NS) 0.9 % injection 10 mL  10 mL Intravenous  PRN Lloyd Huger, MD   10 mL at 08/21/17  1057    OBJECTIVE: Vitals:   04/25/19 1414  BP: 99/81  Pulse: 99  Temp: (!) 97.4 F (36.3 C)     Body mass index is 27.12 kg/m.    ECOG FS:0 - Asymptomatic  General: Well-developed, well-nourished, no acute distress. Eyes: Pink conjunctiva, anicteric sclera. HEENT: Normocephalic, moist mucous membranes. Breast: Exam deferred today. Lungs: No audible wheezing or coughing. Heart: Regular rate and rhythm. Abdomen: Soft, nontender, no obvious distention. Musculoskeletal: No edema, cyanosis, or clubbing. Neuro: Alert, answering all questions appropriately. Cranial nerves grossly intact. Skin: No rashes or petechiae noted. Psych: Normal affect.  LAB RESULTS:  Lab Results  Component Value Date   NA 141 09/11/2017   K 3.8 09/11/2017   CL 109 09/11/2017   CO2 21 (L) 09/11/2017   GLUCOSE 84 09/11/2017   BUN 25 (H) 09/11/2017   CREATININE 1.04 (H) 09/11/2017   CALCIUM 9.5 09/11/2017   PROT 7.5 09/11/2017   ALBUMIN 4.0 09/11/2017   AST 22 09/11/2017   ALT 16 09/11/2017   ALKPHOS 80 09/11/2017   BILITOT 0.3 09/11/2017   GFRNONAA >60 09/11/2017   GFRAA >60 09/11/2017    Lab Results  Component Value Date   WBC 8.3 09/11/2017   NEUTROABS 6.2 09/11/2017   HGB 14.1 09/11/2017   HCT 40.8 09/11/2017   MCV 98.6 09/11/2017   PLT 261 09/11/2017     STUDIES: MM DIAG BREAST TOMO BILATERAL  Result Date: 04/22/2019 CLINICAL DATA:  Left lumpectomy.  Annual mammography. EXAM: DIGITAL DIAGNOSTIC BILATERAL MAMMOGRAM WITH CAD AND TOMO COMPARISON:  Previous exam(s). ACR Breast Density Category b: There are scattered areas of fibroglandular density. FINDINGS: The left lumpectomy site appears as expected. No suspicious masses, calcifications, or distortion are seen in either breast. Mammographic images were processed with CAD. IMPRESSION: No mammographic evidence of malignancy. RECOMMENDATION: Annual diagnostic mammography. I have discussed the findings and recommendations with the patient.  If applicable, a reminder letter will be sent to the patient regarding the next appointment. BI-RADS CATEGORY  2: Benign. 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  PLAN:    1. 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  Patient initiated adjuvant chemotherapy with Taxol and Herceptin on March 13, 2017.  She completed Taxol on May 29, 2017.  Patient then received 4 additional cycles of Herceptin maintenance treatment, but this was permanently discontinued on August 21, 2017 secondary to persistently low EF on MUGA scan.  Continue letrozole for a total of 5 years completing treatment in July 2024.  Her most recent mammogram on April 22, 2019 was reported BI-RADS 2.  Repeat in March 2022.  Return to clinic in 6 months for routine evaluation.   2.  Genetic testing: Negative.  No further interventions are needed. 3.  Joint pain: Patient does not complain of this today. 4.  Anxiety: Continue evaluation and treatment per psychiatry. 5.  Chronic pain: Patient does not complain of this today.  Continue appointments with pain clinic as scheduled. 6.  Reduced ejection fraction: Herceptin has been discontinued permanently.  Continue work-up and evaluation per cardiology.  Most recent cardiac echo revealed EF of approximately 50%.  7.  Osteopenia: Patient's most recent bone mineral density on September 17, 2018 revealed a T score of -1.9 which is essentially unchanged from 1 year prior where the T score was reported -1.8.  Continue calcium and vitamin D supplementation.  Repeat in August 2021. 8.  Dizziness: Unclear etiology, but possibly related to blood sugar or blood pressure.  MRI of the brain  negative.   Patient expressed understanding and was in agreement with this plan. She also understands that She can call clinic at any time with any questions, concerns, or complaints.   Cancer Staging Malignant neoplasm of upper-outer quadrant of left breast in female, estrogen receptor positive ([place]) Staging form: Breast, AJCC 8th Edition - Clinical stage from 02/07/2017: Stage IA (cT1c, cN0, cM0, G1, ER: Positive, PR: Positive, HER2: Positive) - Signed by Lloyd Huger, MD on 02/11/2017   Lloyd Huger, MD   04/25/2019 3:27 PM

## 2019-04-22 ENCOUNTER — Ambulatory Visit
Admission: RE | Admit: 2019-04-22 | Discharge: 2019-04-22 | Disposition: A | Discharge status: Home / Self Care | Payer: Medicare Other | Source: Ambulatory Visit | Attending: Oncology | Admitting: Oncology

## 2019-04-22 DIAGNOSIS — Z170 Estrogen receptor positive status [ER+]: Secondary | ICD-10-CM

## 2019-04-22 DIAGNOSIS — C50412 Malignant neoplasm of upper-outer quadrant of left female breast: Secondary | ICD-10-CM | POA: Insufficient documentation

## 2019-04-25 ENCOUNTER — Inpatient Hospital Stay: Payer: Medicare Other | Attending: Oncology | Admitting: Oncology

## 2019-04-25 ENCOUNTER — Encounter: Payer: Self-pay | Admitting: Oncology

## 2019-04-25 ENCOUNTER — Other Ambulatory Visit: Payer: Self-pay

## 2019-04-25 VITALS — BP 99/81 | HR 99 | Temp 97.4°F | Wt 153.1 lb

## 2019-04-25 DIAGNOSIS — Z170 Estrogen receptor positive status [ER+]: Secondary | ICD-10-CM | POA: Diagnosis not present

## 2019-04-25 DIAGNOSIS — M8580 Other specified disorders of bone density and structure, unspecified site: Secondary | ICD-10-CM | POA: Diagnosis not present

## 2019-04-25 DIAGNOSIS — C50412 Malignant neoplasm of upper-outer quadrant of left female breast: Secondary | ICD-10-CM | POA: Insufficient documentation

## 2019-04-25 DIAGNOSIS — Z79811 Long term (current) use of aromatase inhibitors: Secondary | ICD-10-CM | POA: Insufficient documentation

## 2019-04-25 DIAGNOSIS — F419 Anxiety disorder, unspecified: Secondary | ICD-10-CM | POA: Insufficient documentation

## 2019-04-25 DIAGNOSIS — R42 Dizziness and giddiness: Secondary | ICD-10-CM | POA: Insufficient documentation

## 2019-05-01 ENCOUNTER — Ambulatory Visit (INDEPENDENT_AMBULATORY_CARE_PROVIDER_SITE_OTHER): Payer: Medicare Other | Admitting: Obstetrics and Gynecology

## 2019-05-01 ENCOUNTER — Other Ambulatory Visit (HOSPITAL_COMMUNITY)
Admission: RE | Admit: 2019-05-01 | Discharge: 2019-05-01 | Disposition: A | Discharge status: Home / Self Care | Payer: Medicare Other | Source: Ambulatory Visit | Attending: Obstetrics and Gynecology | Admitting: Obstetrics and Gynecology

## 2019-05-01 ENCOUNTER — Encounter: Payer: Self-pay | Admitting: Obstetrics and Gynecology

## 2019-05-01 ENCOUNTER — Other Ambulatory Visit: Payer: Self-pay

## 2019-05-01 VITALS — BP 114/79 | HR 88 | Ht 63.0 in | Wt 155.2 lb

## 2019-05-01 DIAGNOSIS — Z90711 Acquired absence of uterus with remaining cervical stump: Secondary | ICD-10-CM

## 2019-05-01 DIAGNOSIS — Z780 Asymptomatic menopausal state: Secondary | ICD-10-CM | POA: Insufficient documentation

## 2019-05-01 DIAGNOSIS — Z124 Encounter for screening for malignant neoplasm of cervix: Secondary | ICD-10-CM | POA: Diagnosis not present

## 2019-05-01 DIAGNOSIS — Z01419 Encounter for gynecological examination (general) (routine) without abnormal findings: Secondary | ICD-10-CM | POA: Insufficient documentation

## 2019-05-01 DIAGNOSIS — Z809 Family history of malignant neoplasm, unspecified: Secondary | ICD-10-CM

## 2019-05-01 DIAGNOSIS — N951 Menopausal and female climacteric states: Secondary | ICD-10-CM

## 2019-05-01 DIAGNOSIS — E663 Overweight: Secondary | ICD-10-CM

## 2019-05-01 DIAGNOSIS — Z1151 Encounter for screening for human papillomavirus (HPV): Secondary | ICD-10-CM | POA: Diagnosis not present

## 2019-05-01 DIAGNOSIS — Z853 Personal history of malignant neoplasm of breast: Secondary | ICD-10-CM

## 2019-05-01 NOTE — Patient Instructions (Signed)
Health Maintenance for Postmenopausal Women Menopause is a normal process in which your ability to get pregnant comes to an end. This process happens slowly over many months or years, usually between the ages of 48 and 55. Menopause is complete when you have missed your menstrual periods for 12 months. It is important to talk with your health care provider about some of the most common conditions that affect women after menopause (postmenopausal women). These include heart disease, cancer, and bone loss (osteoporosis). Adopting a healthy lifestyle and getting preventive care can help to promote your health and wellness. The actions you take can also lower your chances of developing some of these common conditions. What should I know about menopause? During menopause, you may get a number of symptoms, such as:  Hot flashes. These can be moderate or severe.  Night sweats.  Decrease in sex drive.  Mood swings.  Headaches.  Tiredness.  Irritability.  Memory problems.  Insomnia. Choosing to treat or not to treat these symptoms is a decision that you make with your health care provider. Do I need hormone replacement therapy?  Hormone replacement therapy is effective in treating symptoms that are caused by menopause, such as hot flashes and night sweats.  Hormone replacement carries certain risks, especially as you become older. If you are thinking about using estrogen or estrogen with progestin, discuss the benefits and risks with your health care provider. What is my risk for heart disease and stroke? The risk of heart disease, heart attack, and stroke increases as you age. One of the causes may be a change in the body's hormones during menopause. This can affect how your body uses dietary fats, triglycerides, and cholesterol. Heart attack and stroke are medical emergencies. There are many things that you can do to help prevent heart disease and stroke. Watch your blood pressure  High  blood pressure causes heart disease and increases the risk of stroke. This is more likely to develop in people who have high blood pressure readings, are of African descent, or are overweight.  Have your blood pressure checked: ? Every 3-5 years if you are 18-39 years of age. ? Every year if you are 40 years old or older. Eat a healthy diet   Eat a diet that includes plenty of vegetables, fruits, low-fat dairy products, and lean protein.  Do not eat a lot of foods that are high in solid fats, added sugars, or sodium. Get regular exercise Get regular exercise. This is one of the most important things you can do for your health. Most adults should:  Try to exercise for at least 150 minutes each week. The exercise should increase your heart rate and make you sweat (moderate-intensity exercise).  Try to do strengthening exercises at least twice each week. Do these in addition to the moderate-intensity exercise.  Spend less time sitting. Even light physical activity can be beneficial. Other tips  Work with your health care provider to achieve or maintain a healthy weight.  Do not use any products that contain nicotine or tobacco, such as cigarettes, e-cigarettes, and chewing tobacco. If you need help quitting, ask your health care provider.  Know your numbers. Ask your health care provider to check your cholesterol and your blood sugar (glucose). Continue to have your blood tested as directed by your health care provider. Do I need screening for cancer? Depending on your health history and family history, you may need to have cancer screening at different stages of your life. This   may include screening for:  Breast cancer.  Cervical cancer.  Lung cancer.  Colorectal cancer. What is my risk for osteoporosis? After menopause, you may be at increased risk for osteoporosis. Osteoporosis is a condition in which bone destruction happens more quickly than new bone creation. To help prevent  osteoporosis or the bone fractures that can happen because of osteoporosis, you may take the following actions:  If you are 19-50 years old, get at least 1,000 mg of calcium and at least 600 mg of vitamin D per day.  If you are older than age 50 but younger than age 70, get at least 1,200 mg of calcium and at least 600 mg of vitamin D per day.  If you are older than age 70, get at least 1,200 mg of calcium and at least 800 mg of vitamin D per day. Smoking and drinking excessive alcohol increase the risk of osteoporosis. Eat foods that are rich in calcium and vitamin D, and do weight-bearing exercises several times each week as directed by your health care provider. How does menopause affect my mental health? Depression may occur at any age, but it is more common as you become older. Common symptoms of depression include:  Low or sad mood.  Changes in sleep patterns.  Changes in appetite or eating patterns.  Feeling an overall lack of motivation or enjoyment of activities that you previously enjoyed.  Frequent crying spells. Talk with your health care provider if you think that you are experiencing depression. General instructions See your health care provider for regular wellness exams and vaccines. This may include:  Scheduling regular health, dental, and eye exams.  Getting and maintaining your vaccines. These include: ? Influenza vaccine. Get this vaccine each year before the flu season begins. ? Pneumonia vaccine. ? Shingles vaccine. ? Tetanus, diphtheria, and pertussis (Tdap) booster vaccine. Your health care provider may also recommend other immunizations. Tell your health care provider if you have ever been abused or do not feel safe at home. Summary  Menopause is a normal process in which your ability to get pregnant comes to an end.  This condition causes hot flashes, night sweats, decreased interest in sex, mood swings, headaches, or lack of sleep.  Treatment for this  condition may include hormone replacement therapy.  Take actions to keep yourself healthy, including exercising regularly, eating a healthy diet, watching your weight, and checking your blood pressure and blood sugar levels.  Get screened for cancer and depression. Make sure that you are up to date with all your vaccines. This information is not intended to replace advice given to you by your health care provider. Make sure you discuss any questions you have with your health care provider. Document Revised: 01/10/2018 Document Reviewed: 01/10/2018 [employer] Patient Education  2020 [employer]    Breast Self-Awareness Breast self-awareness means being familiar with how your breasts look and feel. It involves checking your breasts regularly and reporting any changes to your health care provider. Practicing breast self-awareness is important. Sometimes changes may not be harmful (are benign), but sometimes a change in your breasts can be a sign of a serious medical problem. It is important to learn how to do this procedure correctly so that you can catch problems early, when treatment is more likely to be successful. All women should practice breast self-awareness, including women who have had breast implants. What you need:  A mirror.  A well-lit room. How to do a breast self-exam A breast self-exam is   one way to learn what is normal for your breasts and whether your breasts are changing. To do a breast self-exam: Look for changes  1. Remove all the clothing above your waist. 2. Stand in front of a mirror in a room with good lighting. 3. Put your hands on your hips. 4. Push your hands firmly downward. 5. Compare your breasts in the mirror. Look for differences between them (asymmetry), such as: ? Differences in shape. ? Differences in size. ? Puckers, dips, and bumps in one breast and not the other. 6. Look at each breast for changes in the skin, such as: ? Redness. ? Scaly  areas. 7. Look for changes in your nipples, such as: ? Discharge. ? Bleeding. ? Dimpling. ? Redness. ? A change in position. Feel for changes Carefully feel your breasts for lumps and changes. It is best to do this while lying on your back on the floor, and again while sitting or standing in the tub or shower with soapy water on your skin. Feel each breast in the following way: 1. Place the arm on the side of the breast you are examining above your head. 2. Feel your breast with the other hand. 3. Start in the nipple area and make -inch (2 cm) overlapping circles to feel your breast. Use the pads of your three middle fingers to do this. Apply light pressure, then medium pressure, then firm pressure. The light pressure will allow you to feel the tissue closest to the skin. The medium pressure will allow you to feel the tissue that is a little deeper. The firm pressure will allow you to feel the tissue close to the ribs. 4. Continue the overlapping circles, moving downward over the breast until you feel your ribs below your breast. 5. Move one finger-width toward the center of the body. Continue to use the -inch (2 cm) overlapping circles to feel your breast as you move slowly up toward your collarbone. 6. Continue the up-and-down exam using all three pressures until you reach your armpit.  Write down what you find Writing down what you find can help you remember what to discuss with your health care provider. Write down:  What is normal for each breast.  Any changes that you find in each breast, including: ? The kind of changes you find. ? Any pain or tenderness. ? Size and location of any lumps.  Where you are in your menstrual cycle, if you are still menstruating. General tips and recommendations  Examine your breasts every month.  If you are breastfeeding, the best time to examine your breasts is after a feeding or after using a breast pump.  If you menstruate, the best time to  examine your breasts is 5-7 days after your period. Breasts are generally lumpier during menstrual periods, and it may be more difficult to notice changes.  With time and practice, you will become more familiar with the variations in your breasts and more comfortable with the exam. Contact a health care provider if you:  See a change in the shape or size of your breasts or nipples.  See a change in the skin of your breast or nipples, such as a reddened or scaly area.  Have unusual discharge from your nipples.  Find a lump or thick area that was not there before.  Have pain in your breasts.  Have any concerns related to your breast health. Summary  Breast self-awareness includes looking for physical changes in your breasts, as well   as feeling for any changes within your breasts.  Breast self-awareness should be performed in front of a mirror in a well-lit room.  You should examine your breasts every month. If you menstruate, the best time to examine your breasts is 5-7 days after your menstrual period.  Let your health care provider know of any changes you notice in your breasts, including changes in size, changes on the skin, pain or tenderness, or unusual fluid from your nipples. This information is not intended to replace advice given to you by your health care provider. Make sure you discuss any questions you have with your health care provider. Document Revised: 09/05/2017 Document Reviewed: 09/05/2017 [employer] Patient Education  2020 [employer]  

## 2019-05-01 NOTE — Progress Notes (Signed)
Pt present for annual exam. Pt stated that she doing well.

## 2019-05-01 NOTE — Progress Notes (Signed)
ANNUAL PREVENTATIVE CARE GYNECOLOGY  ENCOUNTER NOTE  Subjective:       Briana [name] is a 54 y.o. G72P1021 female here for a routine annual gynecologic exam. The patient is sexually active (recently became sexually active 3 weeks ago after 6 years of abstinence). Patient denies post-menopausal vaginal bleeding. The patient wears seatbelts: yes. The patient participates in regular exercise: no. Has the patient ever been transfused or tattooed?: no. Kalis  reports that there is not domestic violence in her life.  Current complaints: 1.  History of breast cancer, diagnosed and treated last year. Has hot flashes due to current adjuvant chemotherapy medication treatment (however not as bad as previous medication she was on).  Only has them once or twice a day now.    Gynecologic History Patient's last menstrual period was 08/24/2008. Contraception: status post hysterectomy Last Pap: cannot recall.  Results were: normal.  She does report a history of abnormal pap smears in the remote past (~ 25 years ago).  Last mammogram: 04/22/2019. Results were: normal Last Colonoscopy: 04/16/2018: Results were: normal per patient (noted in Care Everywhere but cannot view report)   Obstetric History OB History  Gravida Para Term Preterm AB Living  '3 1 1   2 1  ' SAB TAB Ectopic Multiple Live Births  '1 1     1    ' # Outcome Date GA Lbr Len/2nd Weight Sex Delivery Anes PTL Lv  3 SAB 1998          2 TAB 1990          1 Term 63    M Vag-Spont   LIV    Past Medical History:  Diagnosis Date  . Abnormal Pap smear of cervix    01/2015 ascus/neg- 04/2015 ascus/neg  . Allergy   . Anxiety   . Arthritis   . Asthma   . Breast cancer ([place]) 2019  . Chronic kidney disease   . COPD (chronic obstructive pulmonary disease) ([place])   . DDD (degenerative disc disease), lumbar   . Depression   . Elevated lipids   . Fibromyalgia   . Fibromyalgia   . Genetic testing 03/09/2017   Multi-Cancer panel (83 genes) @ [facility]  - No pathogenic mutations detected  . GERD (gastroesophageal reflux disease)   . History of IBS   . Hyperthyroidism   . Joint disease   . Migraine   . Migraine   . Oxygen deficiency   . Personal history of chemotherapy   . Personal history of radiation therapy   . Restless leg syndrome   . Sleep apnea    Does not use C-PAP, cannot tolerate mask    Family History  Problem Relation Age of Onset  . Breast cancer Mother 23       Glioblastoma also; deceased at 38  . Diabetes Father   . Lung cancer Father        mesothelioma; deceased 58s  . Cancer Maternal Aunt        "bone ca"; unk. primary  . Colon cancer Maternal Grandfather        dx in 55s; deceased 58  . Colon cancer Maternal Aunt        dx in 33s; currently 21s  . Lung cancer Maternal Grandmother        smoker; deceased 60  . Lung cancer Paternal Grandmother        smoker; deceased 80s  . Breast cancer Cousin  dx 37s; daughter of mat aunt with unk. primary cancer  . Cancer Other        distant cousin; unknown primary  . Colon cancer Other        dx 45s; currently 27; maternal half-sister  . Bipolar disorder Sister   . Schizophrenia Sister     Past Surgical History:  Procedure Laterality Date  . ABDOMINAL HYSTERECTOMY  2008  . BREAST BIOPSY Left 02/02/2017   [place] core positive  . BREAST LUMPECTOMY Left 02/17/2017  . BREAST LUMPECTOMY WITH NEEDLE LOCALIZATION Left 02/17/2017   Procedure: BREAST LUMPECTOMY WITH NEEDLE LOCALIZATION;  Surgeon: Vickie Epley, MD;  Location: [facility];  Service: General;  Laterality: Left;  . CARPAL TUNNEL RELEASE Bilateral   . cryotherapy    . DILATION AND CURETTAGE OF UTERUS    . ENDOMETRIAL ABLATION    . LAPAROSCOPIC OOPHERECTOMY Left    unsure which side but thinks its the left  . LYSIS OF ADHESION Left 10/14/2015   Procedure: LYSIS OF ADHESION;  Surgeon: Thornton Park, MD;  Location: [facility];  Service: Orthopedics;  Laterality: Left;  . NECK SURGERY     lower neck  fusion rods and screws  . OOPHORECTOMY     one ovary removed   . PORTA CATH INSERTION N/A 03/08/2017   Procedure: PORTA CATH INSERTION;  Surgeon: Katha Cabal, MD;  Location: [facility];  Service: Cardiovascular;  Laterality: N/A;  . RESECTION DISTAL CLAVICAL Left 10/14/2015   Procedure: RESECTION DISTAL CLAVICAL;  Surgeon: Thornton Park, MD;  Location: [facility];  Service: Orthopedics;  Laterality: Left;  . SENTINEL NODE BIOPSY Left 02/17/2017   Procedure: SENTINEL NODE BIOPSY;  Surgeon: Vickie Epley, MD;  Location: [facility];  Service: General;  Laterality: Left;  . SHOULDER ARTHROSCOPY WITH OPEN ROTATOR CUFF REPAIR Left 10/14/2015   Procedure: SHOULDER ARTHROSCOPY WITH OPEN ROTATOR CUFF REPAIR;  Surgeon: Thornton Park, MD;  Location: [facility];  Service: Orthopedics;  Laterality: Left;  . SHOULDER ARTHROSCOPY WITH OPEN ROTATOR CUFF REPAIR AND DISTAL CLAVICLE ACROMINECTOMY Right 09/03/2014   Procedure: RIGHT SHOULDER ARTHROSCOPY WITH MINI OPEN ROTATOR CUFF TEAR;  Surgeon: Thornton Park, MD;  Location: [facility];  Service: Orthopedics;  Laterality: Right;  biceps tenodesis, arthroscopic subacromial decompression and distal clavicle incision  . spinal injections    . SUBACROMIAL DECOMPRESSION Left 10/14/2015   Procedure: SUBACROMIAL DECOMPRESSION;  Surgeon: Thornton Park, MD;  Location: [facility];  Service: Orthopedics;  Laterality: Left;    Social History   Socioeconomic History  . Marital status: Divorced    Spouse name: Not on file  . Number of children: 1  . Years of education: Not on file  . Highest education level: High school graduate  Occupational History    Comment: disabled  Tobacco Use  . Smoking status: Current Every Day Smoker    Packs/day: 1.00    Types: Cigarettes  . Smokeless tobacco: Never Used  Substance and Sexual Activity  . Alcohol use: No    Alcohol/week: 0.0 standard drinks  . Drug use: No  . Sexual activity: Yes  Other Topics Concern  .  Not on file  Social History Narrative  . Not on file   Social Determinants of Health   Financial Resource Strain:   . Difficulty of Paying Living Expenses:   Food Insecurity:   . Worried About Charity fundraiser in the Last Year:   . Arboriculturist in the Last Year:   [employer]  Needs:   . Lack of Transportation (Medical):   Marland Kitchen Lack of Transportation (Non-Medical):   Physical Activity:   . Days of Exercise per Week:   . Minutes of Exercise per Session:   Stress:   . Feeling of Stress :   Social Connections:   . Frequency of Communication with Friends and Family:   . Frequency of Social Gatherings with Friends and Family:   . Attends Religious Services:   . Active Member of Clubs or Organizations:   . Attends Archivist Meetings:   Marland Kitchen Marital Status:   Intimate Partner Violence:   . Fear of Current or Ex-Partner:   . Emotionally Abused:   Marland Kitchen Physically Abused:   . Sexually Abused:     Current Outpatient Medications on File Prior to Visit  Medication Sig Dispense Refill  . anastrozole (ARIMIDEX) 1 MG tablet Take 1 tablet by mouth once daily 90 tablet 3  . baclofen (LIORESAL) 10 MG tablet Take 10 mg by mouth 3 (three) times daily as needed for muscle spasms (typically 2-3 times daily).     . calcium carbonate (CALCIUM 600) 600 MG TABS tablet Take by mouth.    . cholecalciferol (VITAMIN D) 1000 UNITS tablet Take 1,000 Units by mouth 2 (two) times daily.     . clindamycin-benzoyl peroxide (BENZACLIN) gel APPLY A SMALL AMOUNT TO AFFECTED SKIN LESION TWICE DAILY  3  . DAYVIGO 10 MG TABS Take 1 tablet by mouth at bedtime.     Marland Kitchen EPINEPHrine (EPIPEN JR) 0.15 MG/0.3ML injection Inject 0.15 mg into the muscle as needed for anaphylaxis.    . hydrocortisone 2.5 % cream Apply 1 application topically 2 (two) times daily as needed (for itchy/dry skin.).    Marland Kitchen hydrOXYzine (ATARAX/VISTARIL) 25 MG tablet Take 25 mg by mouth at bedtime.    Marland Kitchen ketoconazole (NIZORAL) 2 % shampoo       . letrozole (FEMARA) 2.5 MG tablet Take 1 tablet by mouth daily.    . magnesium oxide (MAG-OX) 400 MG tablet Take 400 mg by mouth daily. Reported on 07/08/2015    . Multiple Vitamins-Minerals (HAIR SKIN AND NAILS FORMULA) TABS Take 2 tablets by mouth daily.    . mupirocin cream (BACTROBAN) 2 % Apply 1 application topically 2 (two) times daily. Apply to area under left breast. 15 g 0  . NARCAN 4 MG/0.1ML LIQD nasal spray kit CALL 911. ADMINISTER A SINGLE SPRAY OF NARCAN IN ONE NOSTRIL. REPEAT EVERY 3 MINUTES AS NEEDED IF NO OR MINIMAL RESPONSE.    Marland Kitchen NURTEC 75 MG TBDP     . oxyCODONE 10 MG TABS Take 1 tablet (10 mg total) by mouth every 4 (four) hours as needed for breakthrough pain. (Patient taking differently: Take 15 mg by mouth every 4 (four) hours as needed for breakthrough pain. ) 60 tablet 0  . OXYCODONE HCL PO Take 15 mg by mouth as needed. Take 4-6 times a day as needed.    . pregabalin (LYRICA) 25 MG capsule Take 1 capsule by mouth 2 (two) times daily.    . promethazine (PHENERGAN) 25 MG tablet Take 25 mg by mouth every 6 (six) hours as needed for nausea or vomiting.    . rizatriptan (MAXALT-MLT) 10 MG disintegrating tablet Take 10 mg by mouth as needed for migraine. May repeat in 2 hours if needed    . rosuvastatin (CRESTOR) 40 MG tablet Take 40 mg by mouth at bedtime.     . topiramate (TOPAMAX) 100 MG  tablet Take 200 mg by mouth daily.     . vitamin E (VITAMIN E) 200 UNIT capsule Take 200 Units by mouth 4 (four) times a week. Take 4-5 times a week     Current Facility-Administered Medications on File Prior to Visit  Medication Dose Route Frequency Provider Last Rate Last Admin  . heparin lock flush 100 unit/mL  500 Units Intravenous Once Lloyd Huger, MD      . sodium chloride flush (NS) 0.9 % injection 10 mL  10 mL Intravenous PRN Lloyd Huger, MD   10 mL at 08/21/17 1057    Allergies  Allergen Reactions  . Iodine   . Bee Venom Hives and Rash  . Cefoxitin Rash  .  Cefuroxime Axetil Hives and Rash  . Cucumber Extract Rash  . Gadolinium Derivatives Swelling, Other (See Comments) and Rash    NECK BECAME RED AND TONGUE WAS SWELLING SLIGHTLY PER PT NECK BECAME RED AND TONGUE WAS SWELLING SLIGHTLY PER PT NECK BECAME RED AND TONGUE WAS SWELLING SLIGHTLY PER PT  . Iodinated Diagnostic Agents Hives and Rash  . Latex Rash  . Other Hives and Rash    Bee Stings-swelling/hives/rash Wool (textile fiber)-Rash/itching  . Tomato Rash    Red tomatoes      Review of Systems ROS Review of Systems - General ROS: negative for - chills, fatigue, fever, night sweats, weight gain or weight loss.  Hot flashes (see HPI).  Psychological ROS: negative for - anxiety, decreased libido, depression, mood swings, physical abuse or sexual abuse Ophthalmic ROS: negative for - blurry vision, eye pain or loss of vision ENT ROS: negative for - headaches, hearing change, visual changes or vocal changes Allergy and Immunology ROS: negative for - hives, itchy/watery eyes or seasonal allergies Hematological and Lymphatic ROS: negative for - bleeding problems, bruising, swollen lymph nodes or weight loss Endocrine ROS: negative for - galactorrhea, hair pattern changes, hot flashes, malaise/lethargy, mood swings, palpitations, polydipsia/polyuria, skin changes, temperature intolerance or unexpected weight changes Breast ROS: negative for - new or changing breast lumps or nipple discharge Respiratory ROS: negative for - cough or shortness of breath Cardiovascular ROS: negative for - chest pain, irregular heartbeat, palpitations or shortness of breath Gastrointestinal ROS: no abdominal pain, change in bowel habits, or black or bloody stools Genito-Urinary ROS: no dysuria, trouble voiding, or hematuria Musculoskeletal ROS: negative for - joint pain or joint stiffness Neurological ROS: negative for - bowel and bladder control changes Dermatological ROS: negative for rash and skin lesion  changes   Objective:   BP 114/79   Pulse 88   Ht '5\' 3"'  (1.6 m)   Wt 155 lb 3.2 oz (70.4 kg)   LMP 08/24/2008   BMI 27.49 kg/m  CONSTITUTIONAL: Well-developed, well-nourished female in no acute distress. Overweight PSYCHIATRIC: Normal mood and affect. Normal behavior. Normal judgment and thought content. [place]: Alert and oriented to person, place, and time. Normal muscle tone coordination. No cranial nerve deficit noted. HENT:  Normocephalic, atraumatic, External right and left ear normal. Oropharynx is clear and moist EYES: Conjunctivae and EOM are normal. Pupils are equal, round, and reactive to light. No scleral icterus.  NECK: Normal range of motion, supple, no masses.  Normal thyroid.  SKIN: Skin is warm and dry. No rash noted. Not diaphoretic. No erythema. No pallor. Well-healed scar above left breast CARDIOVASCULAR: Normal heart rate noted, regular rhythm, no murmur. RESPIRATORY: Clear to auscultation bilaterally. Effort and breath sounds normal, no problems with respiration noted. BREASTS:  Symmetric in size. No masses, skin changes, nipple drainage, or lymphadenopathy. ABDOMEN: Soft, normal bowel sounds, no distention noted.  No tenderness, rebound or guarding.  BLADDER: Normal PELVIC:  Bladder no bladder distension noted  Urethra: normal appearing urethra with no masses, tenderness or lesions  Vulva: normal appearing vulva with no masses, tenderness or lesions  Vagina: normal appearing vagina with normal color and discharge, no lesions  Cervix: normal appearing cervix without discharge or lesions  Uterus: surgically absent.   Adnexa: normal right adnexa in size, nontender and no masses and surgically absent left  RV: External Exam NormaI and Normal Sphincter tone. Internal exam not performed.  MUSCULOSKELETAL: Normal range of motion. No tenderness.  No cyanosis, clubbing, or edema.  2+ distal pulses. LYMPHATIC: No Axillary, Supraclavicular, or Inguinal  Adenopathy.   Labs: Lab Results  Component Value Date   WBC 8.3 09/11/2017   HGB 14.1 09/11/2017   HCT 40.8 09/11/2017   MCV 98.6 09/11/2017   PLT 261 09/11/2017    Lab Results  Component Value Date   CREATININE 1.04 (H) 09/11/2017   BUN 25 (H) 09/11/2017   NA 141 09/11/2017   K 3.8 09/11/2017   CL 109 09/11/2017   CO2 21 (L) 09/11/2017    Lab Results  Component Value Date   ALT 16 09/11/2017   AST 22 09/11/2017   ALKPHOS 80 09/11/2017   BILITOT 0.3 09/11/2017    Lab Results  Component Value Date   CHOL 120 03/28/2017   HDL 46 03/28/2017   LDLCALC 56 03/28/2017   TRIG 90 03/28/2017   CHOLHDL 2.6 03/28/2017    Lab Results  Component Value Date   TSH 2.993 03/28/2017    No results found for: HGBA1C   Assessment:   1. Encounter for well woman exam with routine gynecological exam   2. Cervical cancer screening   3. History of hysterectomy, supracervical   4. Menopausal state   5. History of breast cancer in female   6. Family history of cancer   7. Overweight (BMI 25.0-29.9)     Plan:  Pap: Pap Co Test performed today. Recommend repeat screening q 3 years.  Mammogram: Not Indicated. Up to date.  Stool Guaiac Testing:  Not Indicated. Colonoscopy up to date.  Labs: To be performed by PCP.  Routine preventative health maintenance measures emphasized: Exercise/Diet/Weight control, Tobacco Warnings, Alcohol/Substance use risks, Stress Management, Peer Pressure Issues and Safe Sex Encouraged Vitamin D and calcium supplementation for prevention of osteoporosis. Has had 1 COVID shot, plans for next on 4/19. Family history of multiple cancers, has had [facility] hereditary genetic testing performed.   Hot flashes tolerable, have improved significantly since changing to a different chemotherapeutic medication.  Return to [facility] or as needed.     Rubie Maid, MD  [facility]

## 2019-05-02 ENCOUNTER — Encounter: Payer: Self-pay | Admitting: Obstetrics and Gynecology

## 2019-05-06 LAB — CYTOLOGY - PAP
Comment: NEGATIVE
Diagnosis: NEGATIVE
High risk HPV: NEGATIVE

## 2019-09-19 ENCOUNTER — Ambulatory Visit
Admission: RE | Admit: 2019-09-19 | Discharge: 2019-09-19 | Disposition: A | Discharge status: Home / Self Care | Payer: Medicare Other | Source: Ambulatory Visit | Attending: Oncology | Admitting: Oncology

## 2019-09-19 ENCOUNTER — Other Ambulatory Visit: Payer: Self-pay

## 2019-09-19 DIAGNOSIS — Z170 Estrogen receptor positive status [ER+]: Secondary | ICD-10-CM | POA: Insufficient documentation

## 2019-09-19 DIAGNOSIS — M797 Fibromyalgia: Secondary | ICD-10-CM | POA: Diagnosis not present

## 2019-09-19 DIAGNOSIS — Z780 Asymptomatic menopausal state: Secondary | ICD-10-CM | POA: Diagnosis not present

## 2019-09-19 DIAGNOSIS — M85852 Other specified disorders of bone density and structure, left thigh: Secondary | ICD-10-CM | POA: Diagnosis not present

## 2019-09-19 DIAGNOSIS — Z9221 Personal history of antineoplastic chemotherapy: Secondary | ICD-10-CM | POA: Diagnosis not present

## 2019-09-19 DIAGNOSIS — C50412 Malignant neoplasm of upper-outer quadrant of left female breast: Secondary | ICD-10-CM

## 2019-09-19 DIAGNOSIS — Z13820 Encounter for screening for osteoporosis: Secondary | ICD-10-CM | POA: Diagnosis not present

## 2019-09-19 DIAGNOSIS — Z923 Personal history of irradiation: Secondary | ICD-10-CM | POA: Diagnosis not present

## 2019-09-19 DIAGNOSIS — J449 Chronic obstructive pulmonary disease, unspecified: Secondary | ICD-10-CM | POA: Insufficient documentation

## 2019-10-25 NOTE — Progress Notes (Signed)
[place]  [phone] [phone]  ID: Briana [name] OB: 1965/09/10  MR#: [id number]  PJA#:[id number]  Patient Care Team: [name], MD as PCP - General (Internal Medicine) Rico Junker, RN as Oncology Nurse Navigator Grayland Ormond, Kathlene November, MD as Consulting Physician (Oncology) Vickie Epley, MD (Inactive) as Consulting Physician (General Surgery) Noreene Filbert, MD as Referring Physician (Radiation Oncology) Teodoro Spray, MD as Consulting Physician (Cardiology)  I connected with Briana [name] on 10/29/19 at  2:45 PM EDT by video enabled telemedicine visit and verified that I am speaking with the correct person using two identifiers.   I discussed the limitations, risks, security and privacy concerns of performing an evaluation and management service by telemedicine and the availability of in-person appointments. I also discussed with the patient that there may be a patient responsible charge related to this service. The patient expressed understanding and agreed to proceed.   Other persons participating in the visit and their role in the encounter: Patient, MD.  Patient's location: Home. Provider's location: Clinic.  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  INTERVAL HISTORY: Patient initially agreed to video assisted telemedicine visit, but then secondary to technical difficulties visit was converted to telephone only.  She currently feels well and is asymptomatic.  Her dizziness has now resolved.  She has no neurologic complaints.  She denies any recent fevers or illnesses.  She has a good appetite and denies weight loss.  She has no chest pain, cough, hemoptysis, or shortness of breath. She denies any nausea, vomiting, constipation, or diarrhea.  She has no urinary complaints.  Patient offers no specific complaints today.  REVIEW OF  SYSTEMS:   Review of Systems  Constitutional: Negative.  Negative for fever, malaise/fatigue and weight loss.  Respiratory: Negative.  Negative for cough, hemoptysis and shortness of breath.   Cardiovascular: Negative.  Negative for chest pain, palpitations and leg swelling.  Gastrointestinal: Negative.  Negative for abdominal pain.  Genitourinary: Negative.  Negative for dysuria.  Musculoskeletal: Negative.  Negative for joint pain.  Skin: Negative.  Negative for itching and rash.  Neurological: Negative.  Negative for dizziness, sensory change, focal weakness, loss of consciousness, weakness and headaches.  Psychiatric/Behavioral: Negative.  The patient is not nervous/anxious and does not have insomnia.     As per HPI. Otherwise, a complete review of systems is negative.  PAST MEDICAL HISTORY: Past Medical History:  Diagnosis Date  . Abnormal Pap smear of cervix    01/2015 ascus/neg- 04/2015 ascus/neg  . Allergy   . Anxiety   . Arthritis   . Asthma   . Breast cancer ([place]) 2019  . Chronic kidney disease   . COPD (chronic obstructive pulmonary disease) ([place])   . DDD (degenerative disc disease), lumbar   . Depression   . Elevated lipids   . Fibromyalgia   . Fibromyalgia   . Genetic testing 03/09/2017   Multi-Cancer panel (83 genes) @ [facility] - No pathogenic mutations detected  . GERD (gastroesophageal reflux disease)   . History of IBS   . Hyperthyroidism   . Joint disease   . Migraine   . Migraine   . Oxygen deficiency   . Personal history of chemotherapy   . Personal history of radiation therapy   . Restless leg syndrome   . Sleep apnea    Does not use C-PAP, cannot tolerate mask  PAST SURGICAL HISTORY: Past Surgical History:  Procedure Laterality Date  . ABDOMINAL HYSTERECTOMY  2008  . BREAST BIOPSY Left 02/02/2017   [place] core positive  . BREAST LUMPECTOMY Left 02/17/2017  . BREAST LUMPECTOMY WITH NEEDLE LOCALIZATION Left 02/17/2017   Procedure: BREAST  LUMPECTOMY WITH NEEDLE LOCALIZATION;  Surgeon: Vickie Epley, MD;  Location: [facility];  Service: General;  Laterality: Left;  . CARPAL TUNNEL RELEASE Bilateral   . cryotherapy    . DILATION AND CURETTAGE OF UTERUS    . ENDOMETRIAL ABLATION    . LAPAROSCOPIC OOPHERECTOMY Left    unsure which side but thinks its the left  . LYSIS OF ADHESION Left 10/14/2015   Procedure: LYSIS OF ADHESION;  Surgeon: Thornton Park, MD;  Location: [facility];  Service: Orthopedics;  Laterality: Left;  . NECK SURGERY     lower neck fusion rods and screws  . OOPHORECTOMY     one ovary removed   . PORTA CATH INSERTION N/A 03/08/2017   Procedure: PORTA CATH INSERTION;  Surgeon: Katha Cabal, MD;  Location: [facility];  Service: Cardiovascular;  Laterality: N/A;  . RESECTION DISTAL CLAVICAL Left 10/14/2015   Procedure: RESECTION DISTAL CLAVICAL;  Surgeon: Thornton Park, MD;  Location: [facility];  Service: Orthopedics;  Laterality: Left;  . SENTINEL NODE BIOPSY Left 02/17/2017   Procedure: SENTINEL NODE BIOPSY;  Surgeon: Vickie Epley, MD;  Location: [facility];  Service: General;  Laterality: Left;  . SHOULDER ARTHROSCOPY WITH OPEN ROTATOR CUFF REPAIR Left 10/14/2015   Procedure: SHOULDER ARTHROSCOPY WITH OPEN ROTATOR CUFF REPAIR;  Surgeon: Thornton Park, MD;  Location: [facility];  Service: Orthopedics;  Laterality: Left;  . SHOULDER ARTHROSCOPY WITH OPEN ROTATOR CUFF REPAIR AND DISTAL CLAVICLE ACROMINECTOMY Right 09/03/2014   Procedure: RIGHT SHOULDER ARTHROSCOPY WITH MINI OPEN ROTATOR CUFF TEAR;  Surgeon: Thornton Park, MD;  Location: [facility];  Service: Orthopedics;  Laterality: Right;  biceps tenodesis, arthroscopic subacromial decompression and distal clavicle incision  . spinal injections    . SUBACROMIAL DECOMPRESSION Left 10/14/2015   Procedure: SUBACROMIAL DECOMPRESSION;  Surgeon: Thornton Park, MD;  Location: [facility];  Service: Orthopedics;  Laterality: Left;    FAMILY HISTORY: Family  History  Problem Relation Age of Onset  . Breast cancer Mother 44       Glioblastoma also; deceased at 34  . Diabetes Father   . Lung cancer Father        mesothelioma; deceased 54s  . Cancer Maternal Aunt        "bone ca"; unk. primary  . Colon cancer Maternal Grandfather        dx in 18s; deceased 30  . Colon cancer Maternal Aunt        dx in 80s; currently 13s  . Lung cancer Maternal Grandmother        smoker; deceased 74  . Lung cancer Paternal Grandmother        smoker; deceased 57s  . Breast cancer Cousin        dx 62s; daughter of mat aunt with unk. primary cancer  . Cancer Other        distant cousin; unknown primary  . Colon cancer Other        dx 32s; currently 74; maternal half-sister  . Bipolar disorder Sister   . Schizophrenia Sister     ADVANCED DIRECTIVES (Y/N):  N  HEALTH MAINTENANCE: Social History   Tobacco Use  . Smoking status: Current Every Day Smoker  Packs/day: 1.00    Types: Cigarettes  . Smokeless tobacco: Never Used  Vaping Use  . Vaping Use: Never used  Substance Use Topics  . Alcohol use: No    Alcohol/week: 0.0 standard drinks  . Drug use: No     Colonoscopy:  PAP:  Bone density:  Lipid panel:  Allergies  Allergen Reactions  . Iodine   . Bee Venom Hives and Rash  . Cefoxitin Rash  . Cefuroxime Axetil Hives and Rash  . Cucumber Extract Rash  . Gadolinium Derivatives Swelling, Other (See Comments) and Rash    NECK BECAME RED AND TONGUE WAS SWELLING SLIGHTLY PER PT NECK BECAME RED AND TONGUE WAS SWELLING SLIGHTLY PER PT NECK BECAME RED AND TONGUE WAS SWELLING SLIGHTLY PER PT  . Iodinated Diagnostic Agents Hives and Rash  . Latex Rash  . Other Hives and Rash    Bee Stings-swelling/hives/rash Wool (textile fiber)-Rash/itching  . Tomato Rash    Red tomatoes    Current Outpatient Medications  Medication Sig Dispense Refill  . anastrozole (ARIMIDEX) 1 MG tablet Take 1 tablet by mouth once daily 90 tablet 3  . baclofen  (LIORESAL) 10 MG tablet Take 10 mg by mouth 3 (three) times daily as needed for muscle spasms (typically 2-3 times daily).     . calcium carbonate (CALCIUM 600) 600 MG TABS tablet Take by mouth.    . cholecalciferol (VITAMIN D) 1000 UNITS tablet Take 1,000 Units by mouth 2 (two) times daily.     Marland Kitchen EPINEPHrine (EPIPEN JR) 0.15 MG/0.3ML injection Inject 0.15 mg into the muscle as needed for anaphylaxis.    . hydrocortisone 2.5 % cream Apply 1 application topically 2 (two) times daily as needed (for itchy/dry skin.).    Marland Kitchen letrozole (FEMARA) 2.5 MG tablet Take 1 tablet by mouth daily.    . Multiple Vitamins-Minerals (HAIR SKIN AND NAILS FORMULA) TABS Take 2 tablets by mouth daily.    Marland Kitchen NARCAN 4 MG/0.1ML LIQD nasal spray kit CALL 911. ADMINISTER A SINGLE SPRAY OF NARCAN IN ONE NOSTRIL. REPEAT EVERY 3 MINUTES AS NEEDED IF NO OR MINIMAL RESPONSE.    Marland Kitchen NURTEC 75 MG TBDP     . OXYCODONE HCL PO Take 15 mg by mouth as needed. Take 4-6 times a day as needed.    . pregabalin (LYRICA) 25 MG capsule Take 1 capsule by mouth 2 (two) times daily.    . promethazine (PHENERGAN) 25 MG tablet Take 25 mg by mouth every 6 (six) hours as needed for nausea or vomiting.    . rizatriptan (MAXALT-MLT) 10 MG disintegrating tablet Take 10 mg by mouth as needed for migraine. May repeat in 2 hours if needed    . rOPINIRole (REQUIP) 1 MG tablet Take 1.5 mg by mouth at bedtime.    . rosuvastatin (CRESTOR) 40 MG tablet Take 40 mg by mouth at bedtime.     . topiramate (TOPAMAX) 100 MG tablet Take 200 mg by mouth daily.     . vitamin E (VITAMIN E) 200 UNIT capsule Take 200 Units by mouth 4 (four) times a week. Take 4-5 times a week     No current facility-administered medications for this visit.   Facility-Administered Medications Ordered in Other Visits  Medication Dose Route Frequency Provider Last Rate Last Admin  . heparin lock flush 100 unit/mL  500 Units Intravenous Once Lloyd Huger, MD      . sodium chloride flush  (NS) 0.9 % injection 10 mL  10 mL  Intravenous PRN Lloyd Huger, MD   10 mL at 08/21/17 1057    OBJECTIVE: There were no vitals filed for this visit.   There is no height or weight on file to calculate BMI.    ECOG FS:0 - Asymptomatic  General: Well-developed, well-nourished, no acute distress. HEENT: Normocephalic. Neuro: Alert, answering all questions appropriately. Cranial nerves grossly intact. Psych: Normal affect.  LAB RESULTS:  Lab Results  Component Value Date   NA 141 09/11/2017   K 3.8 09/11/2017   CL 109 09/11/2017   CO2 21 (L) 09/11/2017   GLUCOSE 84 09/11/2017   BUN 25 (H) 09/11/2017   CREATININE 1.04 (H) 09/11/2017   CALCIUM 9.5 09/11/2017   PROT 7.5 09/11/2017   ALBUMIN 4.0 09/11/2017   AST 22 09/11/2017   ALT 16 09/11/2017   ALKPHOS 80 09/11/2017   BILITOT 0.3 09/11/2017   GFRNONAA >60 09/11/2017   GFRAA >60 09/11/2017    Lab Results  Component Value Date   WBC 8.3 09/11/2017   NEUTROABS 6.2 09/11/2017   HGB 14.1 09/11/2017   HCT 40.8 09/11/2017   MCV 98.6 09/11/2017   PLT 261 09/11/2017     STUDIES: No results found.  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  PLAN:    1. 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  Patient initiated adjuvant chemotherapy with Taxol and Herceptin on March 13, 2017.  She completed Taxol on May 29, 2017.  Patient then received 4 additional cycles of Herceptin maintenance treatment, but this was permanently discontinued on August 21, 2017 secondary to persistently low EF on MUGA scan.  Continue letrozole for a total of 5 years completing treatment in July 2024.  Her most recent mammogram on April 22, 2019 was reported BI-RADS 2.  Return to clinic in 6 months with repeat mammogram and further  evaluation. 2.  Genetic testing: Negative.  No further interventions are needed. 3.  Joint pain: Patient does not complain of this today. 4.  Anxiety: Continue evaluation and treatment per psychiatry. 5.  Chronic pain: Patient does not complain of this today.  Continue appointments with pain clinic as scheduled. 6.  Reduced ejection fraction: Herceptin was been discontinued permanently.  Continue work-up and evaluation per cardiology.  Most recent cardiac echo revealed EF of approximately 50%.   7.  Osteopenia: Patient's most recent bone mineral density on September 19, 2019 revealed a T score of -2.0 which is essentially unchanged from 1 and 2 years prior where her T score was reported -1.9 and -1.8 respectively.  Continue calcium and vitamin D supplementation.  Repeat in August 2022.   8.  Dizziness: Resolved.  Patient reports it may have been related to a CVA/TIA.  I provided 20 minutes of face-to-face video visit time during this encounter which included chart review, counseling, and coordination of care as documented above.    Patient expressed understanding and was in agreement with this plan. She also understands that She can call clinic at any time with any questions, concerns, or complaints.   Cancer Staging Malignant neoplasm of upper-outer quadrant of left breast in female, estrogen receptor positive ([place]) Staging form: Breast, AJCC 8th Edition - Clinical stage from 02/07/2017: Stage IA (cT1c, cN0, cM0, G1, ER: Positive, PR: Positive, HER2: Positive) - Signed by Lloyd Huger, MD on 02/11/2017  Lloyd Huger, MD   10/29/2019 4:31 PM

## 2019-10-28 ENCOUNTER — Inpatient Hospital Stay: Payer: Medicare Other | Attending: Oncology | Admitting: Oncology

## 2019-10-28 ENCOUNTER — Encounter: Payer: Self-pay | Admitting: Oncology

## 2019-10-28 DIAGNOSIS — Z79811 Long term (current) use of aromatase inhibitors: Secondary | ICD-10-CM | POA: Insufficient documentation

## 2019-10-28 DIAGNOSIS — M8580 Other specified disorders of bone density and structure, unspecified site: Secondary | ICD-10-CM | POA: Insufficient documentation

## 2019-10-28 DIAGNOSIS — C50412 Malignant neoplasm of upper-outer quadrant of left female breast: Secondary | ICD-10-CM

## 2019-10-28 DIAGNOSIS — Z923 Personal history of irradiation: Secondary | ICD-10-CM | POA: Insufficient documentation

## 2019-10-28 DIAGNOSIS — Z79899 Other long term (current) drug therapy: Secondary | ICD-10-CM | POA: Insufficient documentation

## 2019-10-28 DIAGNOSIS — Z9221 Personal history of antineoplastic chemotherapy: Secondary | ICD-10-CM | POA: Insufficient documentation

## 2019-10-28 DIAGNOSIS — Z170 Estrogen receptor positive status [ER+]: Secondary | ICD-10-CM | POA: Diagnosis not present

## 2019-12-17 ENCOUNTER — Other Ambulatory Visit: Payer: Self-pay | Admitting: Oncology

## 2020-03-07 ENCOUNTER — Other Ambulatory Visit: Payer: Self-pay | Admitting: Oncology

## 2020-03-12 ENCOUNTER — Other Ambulatory Visit: Payer: Self-pay | Admitting: Licensed Clinical Social Worker

## 2020-03-12 ENCOUNTER — Ambulatory Visit
Admission: RE | Admit: 2020-03-12 | Discharge: 2020-03-12 | Disposition: A | Discharge status: Home / Self Care | Payer: Medicare Other | Source: Ambulatory Visit | Attending: Radiation Oncology | Admitting: Radiation Oncology

## 2020-03-12 ENCOUNTER — Encounter: Payer: Self-pay | Admitting: Radiation Oncology

## 2020-03-12 VITALS — BP 102/76 | HR 101 | Temp 97.9°F | Wt 163.5 lb

## 2020-03-12 DIAGNOSIS — Z170 Estrogen receptor positive status [ER+]: Secondary | ICD-10-CM | POA: Insufficient documentation

## 2020-03-12 DIAGNOSIS — Z923 Personal history of irradiation: Secondary | ICD-10-CM | POA: Insufficient documentation

## 2020-03-12 DIAGNOSIS — C50412 Malignant neoplasm of upper-outer quadrant of left female breast: Secondary | ICD-10-CM | POA: Insufficient documentation

## 2020-03-12 DIAGNOSIS — Z79811 Long term (current) use of aromatase inhibitors: Secondary | ICD-10-CM | POA: Insufficient documentation

## 2020-03-12 MED ORDER — ANASTROZOLE 1 MG PO TABS: 1.0000 mg | ORAL_TABLET | Freq: Every day | ORAL | 3 refills | Status: DC
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
## 2020-04-23 ENCOUNTER — Other Ambulatory Visit: Payer: Self-pay

## 2020-04-23 ENCOUNTER — Ambulatory Visit
Admission: RE | Admit: 2020-04-23 | Discharge: 2020-04-23 | Disposition: A | Discharge status: Home / Self Care | Payer: Medicare Other | Source: Ambulatory Visit | Attending: Oncology | Admitting: Oncology

## 2020-04-23 DIAGNOSIS — C50412 Malignant neoplasm of upper-outer quadrant of left female breast: Secondary | ICD-10-CM | POA: Diagnosis present

## 2020-04-23 DIAGNOSIS — Z170 Estrogen receptor positive status [ER+]: Secondary | ICD-10-CM | POA: Diagnosis present

## 2020-04-23 NOTE — Progress Notes (Signed)
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  INTERVAL HISTORY: Patient returns to clinic today for routine 55-monthevaluation.  She is tolerating anastrozole well without significant side effects.  She has a significant amount of "bad sweating", but states this is different than the hot flashes she was having previously.  She has no neurologic complaints.  She denies any recent fevers or illnesses.  She has a good appetite and denies weight loss.  She has no chest pain, cough, hemoptysis, or shortness of breath. She denies any nausea, vomiting, constipation, or diarrhea.  She has no urinary complaints.  Patient offers no further specific complaints today.  REVIEW OF SYSTEMS:   Review of Systems  Constitutional: Negative.  Negative for fever, malaise/fatigue and weight loss.  Respiratory: Negative.  Negative for cough, hemoptysis and shortness of breath.   Cardiovascular: Negative.  Negative for chest pain, palpitations and leg swelling.  Gastrointestinal: Negative.  Negative for abdominal pain.  Genitourinary: Negative.  Negative for dysuria.  Musculoskeletal: Negative.  Negative for joint pain.  Skin: Negative.  Negative for itching and rash.  Neurological: Negative.  Negative for dizziness, sensory change, focal weakness, loss of consciousness, weakness and headaches.   Psychiatric/Behavioral: Negative.  The patient is not nervous/anxious and does not have insomnia.     As per HPI. Otherwise, a complete review of systems is negative.  PAST MEDICAL HISTORY: Past Medical History:  Diagnosis Date  . Abnormal Pap smear of cervix    01/2015 ascus/neg- 04/2015 ascus/neg  . Allergy   . Anxiety   . Arthritis   . Asthma   . [place]  . Chronic kidney disease   . COPD (chronic obstructive pulmonary disease) ([facility]   . DDD (degenerative disc disease), lumbar   . Depression   . Elevated lipids   . Fibromyalgia   . Fibromyalgia   . Genetic testing 03/09/2017   Multi-Cancer panel (83 genes) @ [facility] - No pathogenic mutations detected  . GERD (gastroesophageal reflux disease)   . History of IBS   . Hyperthyroidism   . Joint disease   . Migraine   . Migraine   . Oxygen deficiency   . Personal history of chemotherapy   . Personal history of radiation therapy   . Restless leg syndrome   . Sleep apnea    Does not use C-PAP, cannot tolerate mask    PAST SURGICAL HISTORY: Past Surgical History:  Procedure Laterality Date  . ABDOMINAL HYSTERECTOMY  2008  . BREAST BIOPSY Left 02/02/2017   u[place] positive  . BREAST LUMPECTOMY Left 02/17/2017  . BREAST LUMPECTOMY WITH NEEDLE LOCALIZATION Left 02/17/2017   Procedure: BREAST LUMPECTOMY WITH NEEDLE LOCALIZATION;  Surgeon: DVickie Epley MD;  Location: [facility];  Service: General;  Laterality: Left;  . CARPAL TUNNEL RELEASE Bilateral   . cryotherapy    . DILATION AND CURETTAGE OF UTERUS    . ENDOMETRIAL ABLATION    . LAPAROSCOPIC OOPHERECTOMY Left  unsure which side but thinks its the left  . LYSIS OF ADHESION Left 10/14/2015   Procedure: LYSIS OF ADHESION;  Surgeon: [place] Park, MD;  Location: [facility];  Service: Orthopedics;  Laterality: Left;  . NECK SURGERY     lower neck fusion rods and screws  . OOPHORECTOMY     one ovary removed   . PORTA CATH INSERTION N/A 03/08/2017    Procedure: PORTA CATH INSERTION;  Surgeon: Katha Cabal, MD;  Location: [facility];  Service: Cardiovascular;  Laterality: N/A;  . RESECTION DISTAL CLAVICAL Left 10/14/2015   Procedure: RESECTION DISTAL CLAVICAL;  Surgeon: [place] Park, MD;  Location: [facility];  Service: Orthopedics;  Laterality: Left;  . SENTINEL NODE BIOPSY Left 02/17/2017   Procedure: SENTINEL NODE BIOPSY;  Surgeon: Vickie Epley, MD;  Location: [facility];  Service: General;  Laterality: Left;  . SHOULDER ARTHROSCOPY WITH OPEN ROTATOR CUFF REPAIR Left 10/14/2015   Procedure: SHOULDER ARTHROSCOPY WITH OPEN ROTATOR CUFF REPAIR;  Surgeon: [place] Park, MD;  Location: [facility];  Service: Orthopedics;  Laterality: Left;  . SHOULDER ARTHROSCOPY WITH OPEN ROTATOR CUFF REPAIR AND DISTAL CLAVICLE ACROMINECTOMY Right 09/03/2014   Procedure: RIGHT SHOULDER ARTHROSCOPY WITH MINI OPEN ROTATOR CUFF TEAR;  Surgeon: [place] Park, MD;  Location: [facility];  Service: Orthopedics;  Laterality: Right;  biceps tenodesis, arthroscopic subacromial decompression and distal clavicle incision  . spinal injections    . SUBACROMIAL DECOMPRESSION Left 10/14/2015   Procedure: SUBACROMIAL DECOMPRESSION;  Surgeon: Thornton Park, MD;  Location: [facility];  Service: Orthopedics;  Laterality: Left;    FAMILY HISTORY: Family History  Problem Relation Age of Onset  . Breast cancer Mother 62       Glioblastoma also; deceased at 63  . Diabetes Father   . Lung cancer Father        mesothelioma; deceased 71s  . Cancer Maternal Aunt        "bone ca"; unk. primary  . Colon cancer Maternal Grandfather        dx in 52s; deceased 75  . Colon cancer Maternal Aunt        dx in 49s; currently 38s  . Lung cancer Maternal Grandmother        smoker; deceased 20  . Lung cancer Paternal Grandmother        smoker; deceased 84s  . Breast cancer Cousin        dx 35s; daughter of mat aunt with unk. primary cancer  . Cancer Other        distant  cousin; unknown primary  . Colon cancer Other        dx 80s; currently 25; maternal half-sister  . Bipolar disorder Sister   . Schizophrenia Sister     ADVANCED DIRECTIVES (Y/N):  N  HEALTH MAINTENANCE: Social History   Tobacco Use  . Smoking status: Current Every Day Smoker    Packs/day: 1.00    Types: Cigarettes  . Smokeless tobacco: Never Used  Vaping Use  . Vaping Use: Never used  Substance Use Topics  . Alcohol use: No    Alcohol/week: 0.0 standard drinks  . Drug use: No     Colonoscopy:  PAP:  Bone density:  Lipid panel:  Allergies  Allergen Reactions  . Iodine   . Bee Venom Hives and Rash  . Cefoxitin Rash  . Cefuroxime Axetil Hives and Rash  . Cucumber Extract Rash  . Gadolinium Derivatives Swelling, Other (See Comments) and Rash  NECK BECAME RED AND TONGUE WAS SWELLING SLIGHTLY PER PT NECK BECAME RED AND TONGUE WAS SWELLING SLIGHTLY PER PT NECK BECAME RED AND TONGUE WAS SWELLING SLIGHTLY PER PT  . Iodinated Diagnostic Agents Hives and Rash  . Latex Rash  . Other Hives and Rash    Bee Stings-swelling/hives/rash Wool (textile fiber)-Rash/itching  . Tomato Rash    Red tomatoes    Current Outpatient Medications  Medication Sig Dispense Refill  . anastrozole (ARIMIDEX) 1 MG tablet Take 1 tablet (1 mg total) by mouth daily. 90 tablet 3  . baclofen (LIORESAL) 10 MG tablet Take 10 mg by mouth 3 (three) times daily as needed for muscle spasms (typically 2-3 times daily).     . calcium carbonate (OS-CAL) 600 MG TABS tablet Take by mouth.    . cholecalciferol (VITAMIN D) 1000 UNITS tablet Take 1,000 Units by mouth 2 (two) times daily.     Marland Kitchen EPINEPHrine (EPIPEN JR) 0.15 MG/0.3ML injection Inject 0.15 mg into the muscle as needed for anaphylaxis.    . hydrocortisone 2.5 % cream Apply 1 application topically 2 (two) times daily as needed (for itchy/dry skin.).    Marland Kitchen Multiple Vitamins-Minerals (HAIR SKIN AND NAILS FORMULA) TABS Take 2 tablets by mouth daily.    Marland Kitchen  NARCAN 4 MG/0.1ML LIQD nasal spray kit CALL 911. ADMINISTER A SINGLE SPRAY OF NARCAN IN ONE NOSTRIL. REPEAT EVERY 3 MINUTES AS NEEDED IF NO OR MINIMAL RESPONSE.    Marland Kitchen NURTEC 75 MG TBDP     . OXYCODONE HCL PO Take 15 mg by mouth as needed. Take 4-6 times a day as needed.    . pregabalin (LYRICA) 25 MG capsule Take 1 capsule by mouth 2 (two) times daily.    . promethazine (PHENERGAN) 25 MG tablet Take 25 mg by mouth every 6 (six) hours as needed for nausea or vomiting.    . rizatriptan (MAXALT-MLT) 10 MG disintegrating tablet Take 10 mg by mouth as needed for migraine. May repeat in 2 hours if needed    . rOPINIRole (REQUIP) 1 MG tablet Take 1.5 mg by mouth at bedtime.    . rosuvastatin (CRESTOR) 40 MG tablet Take 40 mg by mouth at bedtime.     . topiramate (TOPAMAX) 100 MG tablet Take 200 mg by mouth daily.     . vitamin E 200 UNIT capsule Take 200 Units by mouth 4 (four) times a week. Take 4-5 times a week     No current facility-administered medications for this visit.   Facility-Administered Medications Ordered in Other Visits  Medication Dose Route Frequency Provider Last Rate Last Admin  . heparin lock flush 100 unit/mL  500 Units Intravenous Once Jeralyn Ruths, MD      . sodium chloride flush (NS) 0.9 % injection 10 mL  10 mL Intravenous PRN Jeralyn Ruths, MD   10 mL at 08/21/17 1057    OBJECTIVE: Vitals:   04/28/20 1101  BP: 109/77  Pulse: 93  Resp: 20  Temp: (!) 96.5 F (35.8 C)  SpO2: 100%     Body mass index is 29.53 kg/m.    ECOG FS:0 - Asymptomatic  General: Well-developed, well-nourished, no acute distress. Eyes: Pink conjunctiva, anicteric sclera. HEENT: Normocephalic, moist mucous membranes. Lungs: No audible wheezing or coughing. Heart: Regular rate and rhythm. Abdomen: Soft, nontender, no obvious distention. Musculoskeletal: No edema, cyanosis, or clubbing. Neuro: Alert, answering all questions appropriately. Cranial nerves grossly intact. Skin: No  rashes or petechiae noted. Psych: Normal affect.  LAB RESULTS:  Lab Results  Component Value Date   NA 141 09/11/2017   K 3.8 09/11/2017   CL 109 09/11/2017   CO2 21 (L) 09/11/2017   GLUCOSE 84 09/11/2017   BUN 25 (H) 09/11/2017   CREATININE 1.04 (H) 09/11/2017   CALCIUM 9.5 09/11/2017   PROT 7.5 09/11/2017   ALBUMIN 4.0 09/11/2017   AST 22 09/11/2017   ALT 16 09/11/2017   ALKPHOS 80 09/11/2017   BILITOT 0.3 09/11/2017   GFRNONAA >60 09/11/2017   GFRAA >60 09/11/2017    Lab Results  Component Value Date   WBC 8.3 09/11/2017   NEUTROABS 6.2 09/11/2017   HGB 14.1 09/11/2017   HCT 40.8 09/11/2017   MCV 98.6 09/11/2017   PLT 261 09/11/2017     STUDIES: MM DIAG BREAST TOMO BILATERAL  Result Date: 04/23/2020 CLINICAL DATA:  55 year old female for annual follow-up. History of LEFT breast cancer and lumpectomy in 2019. EXAM: DIGITAL DIAGNOSTIC BILATERAL MAMMOGRAM WITH CAD AND TOMO COMPARISON:  Previous exam(s). ACR Breast Density Category b: There are scattered areas of fibroglandular density. FINDINGS: 2D and 3D full field views of both breasts and a magnification view of the lumpectomy site demonstrate no suspicious mass, nonsurgical distortion or worrisome calcifications. LEFT lumpectomy changes are again noted. Mammographic images were processed with CAD. IMPRESSION: No evidence of breast malignancy. RECOMMENDATION: Per protocol, as the patient is now 2 or more years status post lumpectomy, she may return to annual screening mammography in 1 year. However, given the history of breast cancer, the patient remains eligible for annual diagnostic mammography if preferred. I have discussed the findings and recommendations with the patient. If applicable, a reminder letter will be sent to the patient regarding the next appointment. BI-RADS CATEGORY  2: Benign. Electronically Signed   By: Margarette Canada M.D.   On: 04/23/2020 14:15    ASSESSMENT: Clinical stage Ia ER/PR positive, HER-2  over-expressing invasive carcinoma of the upper outer quadrant of the left breast.  PLAN:    1. 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  Patient initiated adjuvant chemotherapy with Taxol and Herceptin on March 13, 2017.  She completed Taxol on May 29, 2017.  Patient then received 4 additional cycles of Herceptin maintenance treatment, but this was permanently discontinued on August 21, 2017 secondary to persistently low EF on MUGA scan.  Letrozole was discontinued secondary to hot flashes and patient was transitioned to anastrozole.  Continue treatment for a minimum of 5 years through July 2024, but given her high risk disease will consider extending treatment an additional 2 to 5 years.  Her most recent mammogram on April 23, 2020 was reported BI-RADS 2.  Repeat in March 2023.  Return to clinic in 6 months for routine evaluation.    2.  Genetic testing: Negative.  No further interventions are needed. 3.  Joint pain: Patient does not complain of this today. 4.  Anxiety: Continue evaluation and treatment per psychiatry. 5.  Chronic pain: Patient does not complain of this today.  Continue appointments with pain clinic as scheduled. 6.  Reduced ejection fraction: Herceptin was been discontinued permanently.  Continue work-up and evaluation per cardiology.  Most recent cardiac echo revealed EF of approximately 50%.   7.  Osteopenia: Patient's most recent bone mineral density on September 19, 2019 revealed a T score of -2.0 which is essentially unchanged from 1 and 2  years prior where her T score was reported -1.9 and -1.8 respectively.  Continue calcium and vitamin D supplementation.  Repeat in August 2022. 8.  Dizziness: Resolved. 9.  Excessive sweating: Does not appear to be hot flashes or night sweats.  I recommended patient follow-up with her primary care physician for  further evaluation.   Patient expressed understanding and was in agreement with this plan. She also understands that She can call clinic at any time with any questions, concerns, or complaints.   Cancer Staging Malignant neoplasm of upper-outer quadrant of left breast in female, estrogen receptor positive ([facility]) Staging form: Breast, AJCC 8th Edition - Clinical stage from 02/07/2017: Stage IA (cT1c, cN0, cM0, G1, ER: Positive, PR: Positive, HER2: Positive) - Signed by Lloyd Huger, MD on 02/11/2017 Neoadjuvant therapy: No Histologic grading system: 3 grade system Laterality: Left   Lloyd Huger, MD   04/29/2020 8:29 AM

## 2020-04-28 ENCOUNTER — Inpatient Hospital Stay: Payer: Medicare Other | Attending: Oncology | Admitting: Oncology

## 2020-04-28 ENCOUNTER — Encounter: Payer: Self-pay | Admitting: Oncology

## 2020-04-28 VITALS — BP 109/77 | HR 93 | Temp 96.5°F | Resp 20 | Wt 166.7 lb

## 2020-04-28 DIAGNOSIS — Z79811 Long term (current) use of aromatase inhibitors: Secondary | ICD-10-CM | POA: Diagnosis not present

## 2020-04-28 DIAGNOSIS — C50412 Malignant neoplasm of upper-outer quadrant of left female breast: Secondary | ICD-10-CM | POA: Insufficient documentation

## 2020-04-28 DIAGNOSIS — F17210 Nicotine dependence, cigarettes, uncomplicated: Secondary | ICD-10-CM | POA: Insufficient documentation

## 2020-04-28 DIAGNOSIS — Z801 Family history of malignant neoplasm of trachea, bronchus and lung: Secondary | ICD-10-CM | POA: Diagnosis not present

## 2020-04-28 DIAGNOSIS — Z923 Personal history of irradiation: Secondary | ICD-10-CM | POA: Diagnosis not present

## 2020-04-28 DIAGNOSIS — Z833 Family history of diabetes mellitus: Secondary | ICD-10-CM | POA: Diagnosis not present

## 2020-04-28 DIAGNOSIS — Z808 Family history of malignant neoplasm of other organs or systems: Secondary | ICD-10-CM | POA: Insufficient documentation

## 2020-04-28 DIAGNOSIS — Z90710 Acquired absence of both cervix and uterus: Secondary | ICD-10-CM | POA: Diagnosis not present

## 2020-04-28 DIAGNOSIS — Z170 Estrogen receptor positive status [ER+]: Secondary | ICD-10-CM | POA: Diagnosis not present

## 2020-04-28 DIAGNOSIS — Z803 Family history of malignant neoplasm of breast: Secondary | ICD-10-CM | POA: Diagnosis not present

## 2020-04-28 DIAGNOSIS — Z800 Family history of malignant neoplasm of digestive organs: Secondary | ICD-10-CM | POA: Diagnosis not present

## 2020-04-28 DIAGNOSIS — Z9221 Personal history of antineoplastic chemotherapy: Secondary | ICD-10-CM | POA: Insufficient documentation

## 2020-04-28 DIAGNOSIS — M8580 Other specified disorders of bone density and structure, unspecified site: Secondary | ICD-10-CM | POA: Diagnosis not present

## 2020-04-28 DIAGNOSIS — Z79899 Other long term (current) drug therapy: Secondary | ICD-10-CM | POA: Insufficient documentation

## 2020-04-28 NOTE — Progress Notes (Signed)
Patient states she is having really bad sweating

## 2020-05-02 DIAGNOSIS — J449 Chronic obstructive pulmonary disease, unspecified: Secondary | ICD-10-CM | POA: Diagnosis not present

## 2020-05-27 DIAGNOSIS — M48062 Spinal stenosis, lumbar region with neurogenic claudication: Secondary | ICD-10-CM | POA: Diagnosis not present

## 2020-05-27 DIAGNOSIS — M4726 Other spondylosis with radiculopathy, lumbar region: Secondary | ICD-10-CM | POA: Diagnosis not present

## 2020-05-27 DIAGNOSIS — R519 Headache, unspecified: Secondary | ICD-10-CM | POA: Diagnosis not present

## 2020-05-27 DIAGNOSIS — M9931 Osseous stenosis of neural canal of cervical region: Secondary | ICD-10-CM | POA: Diagnosis not present

## 2020-05-27 DIAGNOSIS — G43001 Migraine without aura, not intractable, with status migrainosus: Secondary | ICD-10-CM | POA: Diagnosis not present

## 2020-05-27 DIAGNOSIS — M25519 Pain in unspecified shoulder: Secondary | ICD-10-CM | POA: Diagnosis not present

## 2020-05-27 DIAGNOSIS — M25559 Pain in unspecified hip: Secondary | ICD-10-CM | POA: Diagnosis not present

## 2020-05-27 DIAGNOSIS — G8929 Other chronic pain: Secondary | ICD-10-CM | POA: Diagnosis not present

## 2020-05-27 DIAGNOSIS — M4807 Spinal stenosis, lumbosacral region: Secondary | ICD-10-CM | POA: Diagnosis not present

## 2020-05-27 DIAGNOSIS — M9951 Intervertebral disc stenosis of neural canal of cervical region: Secondary | ICD-10-CM | POA: Diagnosis not present

## 2020-05-27 DIAGNOSIS — M47897 Other spondylosis, lumbosacral region: Secondary | ICD-10-CM | POA: Diagnosis not present

## 2020-05-27 DIAGNOSIS — G4700 Insomnia, unspecified: Secondary | ICD-10-CM | POA: Diagnosis not present

## 2020-05-27 DIAGNOSIS — G894 Chronic pain syndrome: Secondary | ICD-10-CM | POA: Diagnosis not present

## 2020-05-27 DIAGNOSIS — M79609 Pain in unspecified limb: Secondary | ICD-10-CM | POA: Diagnosis not present

## 2020-05-27 DIAGNOSIS — M792 Neuralgia and neuritis, unspecified: Secondary | ICD-10-CM | POA: Diagnosis not present

## 2020-06-01 DIAGNOSIS — J449 Chronic obstructive pulmonary disease, unspecified: Secondary | ICD-10-CM | POA: Diagnosis not present

## 2020-06-11 DIAGNOSIS — G43919 Migraine, unspecified, intractable, without status migrainosus: Secondary | ICD-10-CM | POA: Diagnosis not present

## 2020-06-11 DIAGNOSIS — R5383 Other fatigue: Secondary | ICD-10-CM | POA: Diagnosis not present

## 2020-06-11 DIAGNOSIS — L0211 Cutaneous abscess of neck: Secondary | ICD-10-CM | POA: Diagnosis not present

## 2020-06-11 DIAGNOSIS — G8929 Other chronic pain: Secondary | ICD-10-CM | POA: Diagnosis not present

## 2020-06-11 DIAGNOSIS — L74519 Primary focal hyperhidrosis, unspecified: Secondary | ICD-10-CM | POA: Diagnosis not present

## 2020-06-17 ENCOUNTER — Encounter: Payer: Self-pay | Admitting: Obstetrics and Gynecology

## 2020-06-17 ENCOUNTER — Ambulatory Visit (INDEPENDENT_AMBULATORY_CARE_PROVIDER_SITE_OTHER): Payer: Medicare Other | Admitting: Obstetrics and Gynecology

## 2020-06-17 ENCOUNTER — Other Ambulatory Visit: Payer: Self-pay

## 2020-06-17 VITALS — BP 107/72 | HR 102 | Ht 63.0 in | Wt 169.0 lb

## 2020-06-17 DIAGNOSIS — L739 Follicular disorder, unspecified: Secondary | ICD-10-CM

## 2020-06-17 DIAGNOSIS — R61 Generalized hyperhidrosis: Secondary | ICD-10-CM | POA: Diagnosis not present

## 2020-06-17 NOTE — Patient Instructions (Signed)

## 2020-06-17 NOTE — Progress Notes (Signed)
    GYNECOLOGY PROGRESS NOTE  Subjective:    Patient ID: MEILYN HEINDL, female    DOB: 1965-08-24, 55 y.o.   MRN: [id number]  HPI  Patient is a 55 y.o. [id number] female who presents for complaints of burning and pain of vulvar area.  Is worried that she may have a genital wart as she has a history of HPV. Has felt a small lump.  Notes the area has been bothering her for a few months. Burns with urination, and has itching.   Additionally, patient complains of increased sweating.  Thinks it is due to her chemotherapy medication. Has hot flushes ~ 1 per day, but has night sweats throughout the night.  She reports remote history of trials of Effexor, Gabapentin, Paxil, and natural remedies in the past for her hot flashes (or for mood symptoms) but has had issues with side effects or ineffectiveness. Also has tried cooling sheets and pillows.   The following portions of the patient's history were reviewed and updated as appropriate: allergies, current medications, past family history, past medical history, past social history, past surgical history and problem list.  Review of Systems Pertinent items noted in HPI and remainder of comprehensive ROS otherwise negative.   Objective:   Blood pressure 107/72, pulse (!) 102, height 5\' 3"  (1.6 m), weight 169 lb (76.7 kg), last menstrual period 08/24/2008. General appearance: alert and no distress  Abdomen: soft, non-tender, not distended. No suprapubic tenderness.  Pelvic: external genitalia normal with exception of small area of folliculitis noted on left vulva. Internal exam not performed.  Extremities: extremities normal, atraumatic, no cyanosis or edema Neurologic: Grossly normal    Assessment:   1. Folliculitis   2. Hyperhidrosis    Plan:  1. Folliculitis - discussed self care home measures.  2. Hyperhidrosis - patient thinks it is due to her chemotherapy medication (anastrazole) causing hot flushes and night sweats.  Has tried multiple things  in the past which were not helpful to manage her night sweats, so can attempt to remain dry with use of cornstarch or Gold Bond powder for management of hyperhydrosis, as this will likely prevent recurrences of folliculitis. Also discussed use of feminine wipes and changing underwear during the day to remain dry.   Return if symptoms worsen or fail to improve.   Rubie Maid, MD [facility]

## 2020-06-17 NOTE — Progress Notes (Signed)
Pt present for genital warts. Pt stated that she noticing burning and pain with urination and when she sweat from the area x 3 months. Pt is not sure what it is but think it maybe genital warts. Last sexually partner was over a year ago; currently not sexually activity.

## 2020-06-24 DIAGNOSIS — G894 Chronic pain syndrome: Secondary | ICD-10-CM | POA: Diagnosis not present

## 2020-07-02 DIAGNOSIS — J449 Chronic obstructive pulmonary disease, unspecified: Secondary | ICD-10-CM | POA: Diagnosis not present

## 2020-07-09 DIAGNOSIS — D509 Iron deficiency anemia, unspecified: Secondary | ICD-10-CM | POA: Diagnosis not present

## 2020-07-09 DIAGNOSIS — R7301 Impaired fasting glucose: Secondary | ICD-10-CM | POA: Diagnosis not present

## 2020-07-09 DIAGNOSIS — I10 Essential (primary) hypertension: Secondary | ICD-10-CM | POA: Diagnosis not present

## 2020-07-09 DIAGNOSIS — E785 Hyperlipidemia, unspecified: Secondary | ICD-10-CM | POA: Diagnosis not present

## 2020-07-13 DIAGNOSIS — M25512 Pain in left shoulder: Secondary | ICD-10-CM | POA: Diagnosis not present

## 2020-07-13 DIAGNOSIS — G2581 Restless legs syndrome: Secondary | ICD-10-CM | POA: Diagnosis not present

## 2020-07-13 DIAGNOSIS — M25511 Pain in right shoulder: Secondary | ICD-10-CM | POA: Diagnosis not present

## 2020-07-13 DIAGNOSIS — G43119 Migraine with aura, intractable, without status migrainosus: Secondary | ICD-10-CM | POA: Diagnosis not present

## 2020-07-16 DIAGNOSIS — R5382 Chronic fatigue, unspecified: Secondary | ICD-10-CM | POA: Diagnosis not present

## 2020-07-16 DIAGNOSIS — I10 Essential (primary) hypertension: Secondary | ICD-10-CM | POA: Diagnosis not present

## 2020-07-16 DIAGNOSIS — G8929 Other chronic pain: Secondary | ICD-10-CM | POA: Diagnosis not present

## 2020-07-16 DIAGNOSIS — B029 Zoster without complications: Secondary | ICD-10-CM | POA: Diagnosis not present

## 2020-07-16 DIAGNOSIS — H6001 Abscess of right external ear: Secondary | ICD-10-CM | POA: Diagnosis not present

## 2020-07-16 DIAGNOSIS — E785 Hyperlipidemia, unspecified: Secondary | ICD-10-CM | POA: Diagnosis not present

## 2020-07-16 DIAGNOSIS — F5101 Primary insomnia: Secondary | ICD-10-CM | POA: Diagnosis not present

## 2020-07-16 DIAGNOSIS — D509 Iron deficiency anemia, unspecified: Secondary | ICD-10-CM | POA: Diagnosis not present

## 2020-07-22 DIAGNOSIS — G894 Chronic pain syndrome: Secondary | ICD-10-CM | POA: Diagnosis not present

## 2020-07-22 DIAGNOSIS — M5136 Other intervertebral disc degeneration, lumbar region: Secondary | ICD-10-CM | POA: Diagnosis not present

## 2020-07-22 DIAGNOSIS — M5030 Other cervical disc degeneration, unspecified cervical region: Secondary | ICD-10-CM | POA: Diagnosis not present

## 2020-07-22 DIAGNOSIS — M179 Osteoarthritis of knee, unspecified: Secondary | ICD-10-CM | POA: Diagnosis not present

## 2020-07-29 ENCOUNTER — Ambulatory Visit: Payer: Medicare Other | Attending: Neurology

## 2020-07-29 DIAGNOSIS — M542 Cervicalgia: Secondary | ICD-10-CM | POA: Diagnosis not present

## 2020-07-29 DIAGNOSIS — M25512 Pain in left shoulder: Secondary | ICD-10-CM | POA: Diagnosis not present

## 2020-07-29 NOTE — Therapy (Signed)
[facility] [address], [place], [address] Phone: [phone]   Fax:  [phone]  Physical Therapy Evaluation  Patient Details  Name: Briana [name] MRN: [id number] Date of Birth: 1967-12-56 Referring Provider (PT): Gayland Curry, [place]  Encounter Date: 07/29/2020   PT End of Session - 07/29/20 1544     Visit Number 1    Number of Visits 17    Date for PT Re-Evaluation 09/24/20    Authorization Type 1    Authorization Time Period 10    PT Start Time 7341    PT Stop Time 9379    PT Time Calculation (min) 34 min    Activity Tolerance Patient tolerated treatment well    Behavior During Therapy [facility] for tasks assessed/performed             Past Medical History:  Diagnosis Date   Abnormal Pap smear of cervix    01/2015 ascus/neg- 04/2015 ascus/neg   Allergy    Anxiety    Arthritis    Asthma    Breast cancer ([place]) 2019   Chronic kidney disease    COPD (chronic obstructive pulmonary disease) ([place])    DDD (degenerative disc disease), lumbar    Depression    Elevated lipids    Fibromyalgia    Fibromyalgia    Genetic testing 03/09/2017   Multi-Cancer panel (83 genes) @ [facility] - No pathogenic mutations detected   GERD (gastroesophageal reflux disease)    History of IBS    Hyperthyroidism    Joint disease    Migraine    Migraine    Oxygen deficiency    Personal history of chemotherapy    Personal history of radiation therapy    Restless leg syndrome    Sleep apnea    Does not use C-PAP, cannot tolerate mask    Past Surgical History:  Procedure Laterality Date   ABDOMINAL HYSTERECTOMY  2008   BREAST BIOPSY Left 02/02/2017   [place] core positive   BREAST LUMPECTOMY Left 02/17/2017   BREAST LUMPECTOMY WITH NEEDLE LOCALIZATION Left 02/17/2017   Procedure: BREAST LUMPECTOMY WITH NEEDLE LOCALIZATION;  Surgeon: Vickie Epley, MD;  Location: [facility] ORS;  Service: General;  Laterality: Left;   CARPAL  TUNNEL RELEASE Bilateral    cryotherapy     DILATION AND CURETTAGE OF UTERUS     ENDOMETRIAL ABLATION     LAPAROSCOPIC OOPHERECTOMY Left    unsure which side but thinks its the left   LYSIS OF ADHESION Left 10/14/2015   Procedure: LYSIS OF ADHESION;  Surgeon: Thornton Park, MD;  Location: [facility] ORS;  Service: Orthopedics;  Laterality: Left;   NECK SURGERY     lower neck fusion rods and screws   OOPHORECTOMY     one ovary removed    PORTA CATH INSERTION N/A 03/08/2017   Procedure: PORTA CATH INSERTION;  Surgeon: Katha Cabal, MD;  Location: [place] [facility];  Service: Cardiovascular;  Laterality: N/A;   RESECTION DISTAL CLAVICAL Left 10/14/2015   Procedure: RESECTION DISTAL CLAVICAL;  Surgeon: Thornton Park, MD;  Location: [facility] ORS;  Service: Orthopedics;  Laterality: Left;   SENTINEL NODE BIOPSY Left 02/17/2017   Procedure: SENTINEL NODE BIOPSY;  Surgeon: Vickie Epley, MD;  Location: [facility] ORS;  Service: General;  Laterality: Left;   SHOULDER ARTHROSCOPY WITH OPEN ROTATOR CUFF REPAIR Left 10/14/2015   Procedure: SHOULDER ARTHROSCOPY WITH OPEN ROTATOR CUFF REPAIR;  Surgeon: Thornton Park, MD;  Location: [facility]  ORS;  Service: Orthopedics;  Laterality: Left;   SHOULDER ARTHROSCOPY WITH OPEN ROTATOR CUFF REPAIR AND DISTAL CLAVICLE ACROMINECTOMY Right 09/03/2014   Procedure: RIGHT SHOULDER ARTHROSCOPY WITH MINI OPEN ROTATOR CUFF TEAR;  Surgeon: Thornton Park, MD;  Location: [facility] ORS;  Service: Orthopedics;  Laterality: Right;  biceps tenodesis, arthroscopic subacromial decompression and distal clavicle incision   spinal injections     SUBACROMIAL DECOMPRESSION Left 10/14/2015   Procedure: SUBACROMIAL DECOMPRESSION;  Surgeon: Thornton Park, MD;  Location: [facility] ORS;  Service: Orthopedics;  Laterality: Left;    There were no vitals filed for this visit.    Subjective Assessment - 07/29/20 1510     Subjective L shoulder joint: 7/10 currently, 8/10 at worst for the past 2 weeks, L  lateral neck pain: 4/10 currently, 8/10 at most for the past 2 weeks.    Pertinent History L shoulder pain. Pain starts from the L side of her neck which moves to her L shoulder. Pt also states having R mid low back pain. Also states having degenerative joint and disc disease. Goes to pain management. L shoulder pain began about 3 weeks ago, sudden onset, unknown mechanism of injury. Feels tight L posteror shoulder. Felt ok about 3 days ago but a day or two later, pain returned. Pt is mainly R hand dominant. Does not understand what is going on. L shoulder was worse after the surgery and does not understand it.    Patient Stated Goals Decrease pain, move better with less pain.    Currently in Pain? Yes    Pain Score 7     Pain Location Shoulder    Pain Orientation Left    Pain Type Acute pain    Pain Onset 1 to 4 weeks ago    Pain Frequency Constant    Aggravating Factors  L shoulder: raising her arm up, picking something heavy up, not supposed to pick up anything heavier than 10 lbs, cold water, brushing her hair, L lateral neck: L cervical rotation,    Pain Relieving Factors Warm compress                [facility] PT Assessment - 07/29/20 1522       Assessment   Medical Diagnosis Acute pain of both shoulders    Referring Provider (PT) Gayland Curry, PA    Onset Date/Surgical Date 07/13/20    Hand Dominance Right      Precautions   Precaution Comments L breast CA and lymph node surgery      Observation/Other Assessments   Focus on Therapeutic Outcomes (FOTO)  L shoulder FOTO 53      Posture/Postural Control   Posture Comments B protracted shoulders, slight R lateral shift (neck)      AROM   Left Shoulder Flexion 112 Degrees   with pain, AAROM 122 degrees with distal infraspinatus pain   Left Shoulder ABduction 144 Degrees    Left Shoulder Internal Rotation --   Functional IR: L thumb to bra line, not a problem per pt.   Left Shoulder External Rotation --   Functional ER: middle  finger to T1   Cervical Flexion WFL    Cervical Extension Limited with L posterior neck pull.    Cervical - Right Side [facility] with L posterior lateral neck pull    Cervical - Left Side [facility] with L lateral neck soreness    Cervical - Right Rotation 55    Cervical - Left Rotation 60   with L  anterior shoulder muscle pulling     Strength   Overall Strength Comments Manually resisted scapular retraction targeting lower trap: L 3+/5    Left Shoulder Flexion 4/5    Left Shoulder ABduction 4/5    Left Shoulder Internal Rotation 4/5    Left Shoulder External Rotation 4/5      Special Tests   Other special tests Empty can test: L lateral neck discomfort. (-) Hawkin's Kenedy test, and Neer's impingement test.                        Objective measurements completed on examination: See above findings.    Latex allergies  Nothing tight for L arm (such as blood pressure cuff) secondary breast CA and surgery.   Hx of B rotator cuff surgery and disc repair in neck.   [employer]   Muscle tension L upper trap     Response to treatment Pt tolerated session well without aggravation of symptoms.    Clinical impression Pt is a 55 year old female who came to physical therapy secondary to L shoulder pain. She also presents with cervical involvement, limited L shoulder AROM, L shoulder weakness, poor glenohumeral mechanics, and difficulty reaching, raising her arm up, as well as brushing her hair secondary to L shoulder pain. Pt will benefit from skilled physical therapy services to address the aforementioned deficits.                PT Education - 07/29/20 1835     Education provided Yes    Education Details plan of care    Person(s) Educated Patient    Methods Explanation    Comprehension Verbalized understanding              PT Short Term Goals - 07/29/20 1812       PT SHORT TERM GOAL #1   Title Pt will be independent with her initial HEP to  decrease pain, improve strength, ROM, function, and ability to reach more comfortably.    Time 3    Period Weeks    Status New    Target Date 08/20/20               PT Long Term Goals - 07/29/20 1819       PT LONG TERM GOAL #1   Title Pt will have a decrease in L shoulder joint pain to 3/10 or less at worst to promote ability to raise her arm, reach, and carry items more comfortably.    Baseline 8/10 L shoulder pain at most for the past 2 weeks (07/29/2020)    Time 8    Period Weeks    Status New    Target Date 09/24/20      PT LONG TERM GOAL #2   Title Pt will have a decrease in L lateral neck pain to 3/10 or less at worst to promote ability to raise her arm, reach, and carry items more comfortably.    Baseline 8/10 L lateral neck pain at most for the past 2 weeks (07/29/2020)    Time 8    Period Weeks    Status New    Target Date 09/24/20      PT LONG TERM GOAL #3   Title Pt will improve L shoulder flexion AROM to 140 degrees or more to promote ability to raise her arm and reach more comfortably.    Baseline 112 degrees L shoulder flexion AROM with pain (07/29/2020)  Time 8    Period Weeks    Status New    Target Date 09/24/20      PT LONG TERM GOAL #4   Title Pt will improve L shoulder flexion, abduction, ER, IR strength by at least 1/2 MMT grade to promote ability so use her L UE for functional tasks more comfortably.    Baseline 4/5 L shoulder strength all planes (07/29/2020)    Time 8    Period Weeks    Status New    Target Date 09/24/20      PT LONG TERM GOAL #5   Title Pt will improve L shoulder FOTO score by at least 10 points as a demonstration of improved function.    Baseline L shoulder FOTO 53 (07/29/2020)    Time 8    Period Weeks    Status New    Target Date 09/24/20                    Plan - 07/29/20 1800     Clinical Impression Statement Pt is a 55 year old female who came to physical therapy secondary to L shoulder pain. She also  presents with cervical involvement, limited L shoulder AROM, L shoulder weakness, poor glenohumeral mechanics, and difficulty reaching, raising her arm up, as well as brushing her hair secondary to L shoulder pain. Pt will benefit from skilled physical therapy services to address the aforementioned deficits.    Personal Factors and Comorbidities Comorbidity 3+;Time since onset of injury/illness/exacerbation;Past/Current Experience    Comorbidities DDD, depression, fibromyalgia, anxiety, COPD, DDD, hx of breast CA S/P surgery, neck surgery    Examination-Activity Limitations Hygiene/Grooming;Lift;Reach Overhead;Carry    Stability/Clinical Decision Making Stable/Uncomplicated    Clinical Decision Making Low    Rehab Potential Fair    Clinical Impairments Affecting Rehab Potential multiple co-morbidities    PT Frequency 2x / week    PT Duration 8 weeks    PT Treatment/Interventions Therapeutic activities;Therapeutic exercise;Neuromuscular re-education;Patient/family education;Manual techniques;Dry needling;Electrical Stimulation;Iontophoresis 4mg /ml Dexamethasone    PT Next Visit Plan Posture, scapular and shoulder strength, ROM, manual techniques, modalities PRN    Consulted and Agree with Plan of Care Patient             Patient will benefit from skilled therapeutic intervention in order to improve the following deficits and impairments:  Pain, Improper body mechanics, Decreased strength  Visit Diagnosis: Acute pain of left shoulder - Plan: PT plan of care cert/re-cert  Cervicalgia - Plan: PT plan of care cert/re-cert     Problem List Patient Active Problem List   Diagnosis Date Noted   Genetic testing 03/09/2017   Rotator cuff tear 02/07/2017   Shoulder pain 02/07/2017   Malignant neoplasm of upper-outer quadrant of left breast in female, estrogen receptor positive ([place]) 02/07/2017   History of hysterectomy 03/16/2016   Rotator cuff (capsule) sprain 10/15/2015   S/P rotator  cuff repair 10/14/2015   Atypical squamous cells of undetermined significance on cytologic smear of cervix (ASC-US) 05/13/2015   DDD (degenerative disc disease), cervical 07/21/2014   Status post cervical spinal fusion 07/21/2014   DDD (degenerative disc disease), thoracic 07/21/2014   DDD (degenerative disc disease), lumbar 07/21/2014   Facet syndrome, lumbar 07/21/2014   Sacroiliac joint dysfunction 07/21/2014   Occipital neuralgia 07/21/2014   DJD of shoulder 07/21/2014   Intractable migraine with aura without status migrainosus 02/27/2014   Bipolar disorder ([place]) 02/17/2014   CAFL (chronic airflow limitation) ([place]) 02/17/2014   Fibrositis  02/17/2014   Cephalalgia 02/17/2014   HLD (hyperlipidemia) 02/17/2014   Impaired renal function 02/17/2014   Restless leg 02/17/2014   Apnea, sleep 02/17/2014   Chronic pain associated with significant psychosocial dysfunction 11/28/2013    Joneen Boers PT, DPT   07/29/2020, 6:37 PM  [facility] [facility] [address], [place], [address] Phone: [phone]   Fax:  [phone]  Name: MERICA PRELL MRN: [id number] Date of Birth: [birth year]

## 2020-08-01 DIAGNOSIS — J449 Chronic obstructive pulmonary disease, unspecified: Secondary | ICD-10-CM | POA: Diagnosis not present

## 2020-08-05 ENCOUNTER — Ambulatory Visit: Payer: Medicare Other | Attending: Neurology

## 2020-08-05 DIAGNOSIS — M542 Cervicalgia: Secondary | ICD-10-CM | POA: Diagnosis not present

## 2020-08-05 DIAGNOSIS — M25512 Pain in left shoulder: Secondary | ICD-10-CM | POA: Insufficient documentation

## 2020-08-05 NOTE — Patient Instructions (Signed)
Access Code: [email or web address] URL: [email or web address] Date: 08/05/2020 Prepared by: Joneen Boers  Exercises Seated Scapular Retraction - 1 x daily - 7 x weekly - 3 sets - 10 reps - 5 seconds hold

## 2020-08-05 NOTE — Therapy (Signed)
[facility] [address], [place], [address] Phone: [phone]   Fax:  [phone]  Physical Therapy Treatment  Patient Details  Name: Briana [name] MRN: [id number] Date of Birth: [birth year] Referring Provider (PT): Gayland Curry, [place]   Encounter Date: 08/05/2020   PT End of Session - 08/05/20 1403     Visit Number 2    Number of Visits 17    Date for PT Re-Evaluation 09/24/20    Authorization Type 2    Authorization Time Period 10    PT Start Time 1406    PT Stop Time 1452    PT Time Calculation (min) 46 min    Activity Tolerance Patient tolerated treatment well    Behavior During Therapy [facility] for tasks assessed/performed             Past Medical History:  Diagnosis Date   Abnormal Pap smear of cervix    01/2015 ascus/neg- 04/2015 ascus/neg   Allergy    Anxiety    Arthritis    Asthma    Breast cancer ([place]) 2019   Chronic kidney disease    COPD (chronic obstructive pulmonary disease) ([place])    DDD (degenerative disc disease), lumbar    Depression    Elevated lipids    Fibromyalgia    Fibromyalgia    Genetic testing 03/09/2017   Multi-Cancer panel (83 genes) @ [facility] - No pathogenic mutations detected   GERD (gastroesophageal reflux disease)    History of IBS    Hyperthyroidism    Joint disease    Migraine    Migraine    Oxygen deficiency    Personal history of chemotherapy    Personal history of radiation therapy    Restless leg syndrome    Sleep apnea    Does not use C-PAP, cannot tolerate mask    Past Surgical History:  Procedure Laterality Date   ABDOMINAL HYSTERECTOMY  2008   BREAST BIOPSY Left 02/02/2017   [place] core positive   BREAST LUMPECTOMY Left 02/17/2017   BREAST LUMPECTOMY WITH NEEDLE LOCALIZATION Left 02/17/2017   Procedure: BREAST LUMPECTOMY WITH NEEDLE LOCALIZATION;  Surgeon: Vickie Epley, MD;  Location: [facility] ORS;  Service: General;  Laterality: Left;   CARPAL  TUNNEL RELEASE Bilateral    cryotherapy     DILATION AND CURETTAGE OF UTERUS     ENDOMETRIAL ABLATION     LAPAROSCOPIC OOPHERECTOMY Left    unsure which side but thinks its the left   LYSIS OF ADHESION Left 10/14/2015   Procedure: LYSIS OF ADHESION;  Surgeon: Thornton Park, MD;  Location: [facility] ORS;  Service: Orthopedics;  Laterality: Left;   NECK SURGERY     lower neck fusion rods and screws   OOPHORECTOMY     one ovary removed    PORTA CATH INSERTION N/A 03/08/2017   Procedure: PORTA CATH INSERTION;  Surgeon: Katha Cabal, MD;  Location: [place] [facility];  Service: Cardiovascular;  Laterality: N/A;   RESECTION DISTAL CLAVICAL Left 10/14/2015   Procedure: RESECTION DISTAL CLAVICAL;  Surgeon: Thornton Park, MD;  Location: [facility] OR[facility];  Service: Orthopedics;  Laterality: Left;   SENTINEL NODE BIOPSY Left 02/17/2017   Procedure: SENTINEL NODE BIOPSY;  Surgeon: Vickie Epley, MD;  Location: [facility] OR[facility];  Service: General;  Laterality: Left;   SHOULDER ARTHROSCOPY WITH OPEN ROTATOR CUFF REPAIR Left 10/14/2015   Procedure: SHOULDER ARTHROSCOPY WITH OPEN ROTATOR CUFF REPAIR;  Surgeon: Thornton Park, MD;  Location:  [facility];  Service: Orthopedics;  Laterality: Left;   SHOULDER ARTHROSCOPY WITH OPEN ROTATOR CUFF REPAIR AND DISTAL CLAVICLE ACROMINECTOMY Right 09/03/2014   Procedure: RIGHT SHOULDER ARTHROSCOPY WITH MINI OPEN ROTATOR CUFF TEAR;  Surgeon: Thornton Park, MD;  Location: [facility];  Service: Orthopedics;  Laterality: Right;  biceps tenodesis, arthroscopic subacromial decompression and distal clavicle incision   spinal injections     SUBACROMIAL DECOMPRESSION Left 10/14/2015   Procedure: SUBACROMIAL DECOMPRESSION;  Surgeon: Thornton Park, MD;  Location: [facility] OR[facility];  Service: Orthopedics;  Laterality: Left;    There were no vitals filed for this visit.   Subjective Assessment - 08/05/20 1407     Subjective L shoulder is a little sore 6/10 L anterior shoulder soreness currently.  First cousin also died in 07/17/2022 and just found out.    Pertinent History L shoulder pain. Pain starts from the L side of her neck which moves to her L shoulder. Pt also states having R mid low back pain. Also states having degenerative joint and disc disease. Goes to pain management. L shoulder pain began about 3 weeks ago, sudden onset, unknown mechanism of injury. Feels tight L posteror shoulder. Felt ok about 3 days ago but a day or two later, pain returned. Pt is mainly R hand dominant. Does not understand what is going on. L shoulder was worse after the surgery and does not understand it.    Patient Stated Goals Decrease pain, move better with less pain.    Currently in Pain? Yes    Pain Score 6     Pain Onset 1 to 4 weeks ago                                       Objective      Latex allergies   Nothing tight for L arm (such as blood pressure cuff) secondary breast CA and surgery.    Hx of B rotator cuff surgery and disc repair in neck.    [employer]   Muscle tension L upper trap   [employer] Access Code [email or web address]  Therapeutic exercise  Seated B scapular retraction 10x3 with 5 second holds  Seated chin tucks 10x3   Seated manually resisted L shoulder extension isometrics in 75 degrees elbow flexion, PT manual resistance 10x3 with 5 second holds 4  Posterior glide of humeral head palpated  Seated L shoulder IR with elbow flexed, PT manual resistance 10x3 with 5 seconds holds   Seated manually resisted L shoulder extension isometrics in slight shoulder flexion position 10x5 seconds     Improved exercise technique, movement at target joints, use of target muscles after mod verbal, visual, tactile cues.        Manual therapy Seated STM L infraspinatus muscle to decrease tension   Decreased L anterior shoulder pain   seated STM L cervical paraspinal and upper trap muscle to decrease tension   Decreased L shoulder soreness afterwards.    2/10 with 129 degrees shoulder flexion after manual therapy         Response to treatment Pt tolerated session well without aggravation of symptoms.     Clinical impression Decreased L anterior shoulder pain with treatment to decrease L infraspinatus, cervical paraspinal and upper trap muscle tension as well as improving posterior glide of humeral head. Pt will benefit from continued skilled physical therapy services to decrease pain, improve strength and function.  PT Short Term Goals - 07/29/20 1812       PT SHORT TERM GOAL #1   Title Pt will be independent with her initial HEP to decrease pain, improve strength, ROM, function, and ability to reach more comfortably.    Time 3    Period Weeks    Status New    Target Date 08/20/20               PT Long Term Goals - 07/29/20 1819       PT LONG TERM GOAL #1   Title Pt will have a decrease in L shoulder joint pain to 3/10 or less at worst to promote ability to raise her arm, reach, and carry items more comfortably.    Baseline 8/10 L shoulder pain at most for the past 2 weeks (07/29/2020)    Time 8    Period Weeks    Status New    Target Date 09/24/20      PT LONG TERM GOAL #2   Title Pt will have a decrease in L lateral neck pain to 3/10 or less at worst to promote ability to raise her arm, reach, and carry items more comfortably.    Baseline 8/10 L lateral neck pain at most for the past 2 weeks (07/29/2020)    Time 8    Period Weeks    Status New    Target Date 09/24/20      PT LONG TERM GOAL #3   Title Pt will improve L shoulder flexion AROM to 140 degrees or more to promote ability to raise her arm and reach more comfortably.    Baseline 112 degrees L shoulder flexion AROM with pain (07/29/2020)    Time 8    Period Weeks    Status New    Target Date 09/24/20      PT LONG TERM GOAL #4   Title Pt will improve L shoulder flexion, abduction, ER, IR strength by at least 1/2 MMT grade to promote ability  so use her L UE for functional tasks more comfortably.    Baseline 4/5 L shoulder strength all planes (07/29/2020)    Time 8    Period Weeks    Status New    Target Date 09/24/20      PT LONG TERM GOAL #5   Title Pt will improve L shoulder FOTO score by at least 10 points as a demonstration of improved function.    Baseline L shoulder FOTO 53 (07/29/2020)    Time 8    Period Weeks    Status New    Target Date 09/24/20                   Plan - 08/05/20 1403     Clinical Impression Statement Decreased L anterior shoulder pain with treatment to decrease L infraspinatus, cervical paraspinal and upper trap muscle tension as well as improving posterior glide of humeral head. Pt will benefit from continued skilled physical therapy services to decrease pain, improve strength and function.    Personal Factors and Comorbidities Comorbidity 3+;Time since onset of injury/illness/exacerbation;Past/Current Experience    Comorbidities DDD, depression, fibromyalgia, anxiety, COPD, DDD, hx of breast CA S/P surgery, neck surgery    Examination-Activity Limitations Hygiene/Grooming;Lift;Reach Overhead;Carry    Stability/Clinical Decision Making Stable/Uncomplicated    Rehab Potential Fair    Clinical Impairments Affecting Rehab Potential multiple co-morbidities    PT Frequency 2x / week    PT Duration 8 weeks  PT Treatment/Interventions Therapeutic activities;Therapeutic exercise;Neuromuscular re-education;Patient/family education;Manual techniques;Dry needling;Electrical Stimulation;Iontophoresis 4mg /ml Dexamethasone    PT Next Visit Plan Posture, scapular and shoulder strength, ROM, manual techniques, modalities PRN    Consulted and Agree with Plan of Care Patient             Patient will benefit from skilled therapeutic intervention in order to improve the following deficits and impairments:  Pain, Improper body mechanics, Decreased strength  Visit Diagnosis: Acute pain of left  shoulder  Cervicalgia     Problem List Patient Active Problem List   Diagnosis Date Noted   Genetic testing 03/09/2017   Rotator cuff tear 02/07/2017   Shoulder pain 02/07/2017   Malignant neoplasm of upper-outer quadrant of left breast in female, estrogen receptor positive ([employer]) 02/07/2017   History of hysterectomy 03/16/2016   Rotator cuff (capsule) sprain 10/15/2015   S/P rotator cuff repair 10/14/2015   Atypical squamous cells of undetermined significance on cytologic smear of cervix ([facility]) 05/13/2015   DDD (degenerative disc disease), cervical 07/21/2014   Status post cervical spinal fusion 07/21/2014   DDD (degenerative disc disease), thoracic 07/21/2014   DDD (degenerative disc disease), lumbar 07/21/2014   Facet syndrome, lumbar 07/21/2014   Sacroiliac joint dysfunction 07/21/2014   Occipital neuralgia 07/21/2014   DJD of shoulder 07/21/2014   Intractable migraine with aura without status migrainosus 02/27/2014   Bipolar disorder ([place]) 02/17/2014   CAFL (chronic airflow limitation) ([place]) 02/17/2014   Fibrositis 02/17/2014   Cephalalgia 02/17/2014   HLD (hyperlipidemia) 02/17/2014   Impaired renal function 02/17/2014   Restless leg 02/17/2014   Apnea, sleep 02/17/2014   Chronic pain associated with significant psychosocial dysfunction 11/28/2013    Joneen Boers PT, DPT   08/05/2020, 2:55 PM  [place] [facility] [address], [place], [address] Phone: [phone]   Fax:  [phone]  Name: Briana [name] MRN: [id number] Date of Birth: Sep 25, 1965

## 2020-08-10 ENCOUNTER — Ambulatory Visit: Payer: Medicare Other

## 2020-08-10 DIAGNOSIS — M25512 Pain in left shoulder: Secondary | ICD-10-CM | POA: Diagnosis not present

## 2020-08-10 DIAGNOSIS — M542 Cervicalgia: Secondary | ICD-10-CM

## 2020-08-10 NOTE — Therapy (Signed)
[facility] [address], [place], [address] Phone: [phone]   Fax:  [phone]  Physical Therapy Treatment  Patient Details  Name: JOELEEN [name] MRN: [id number] Date of Birth: December 08, 1965 Referring Provider (PT): Gayland Curry, [place]   Encounter Date: 08/10/2020   PT End of Session - 08/10/20 1502     Visit Number 3    Number of Visits 17    Date for PT Re-Evaluation 09/24/20    Authorization Type 3    Authorization Time Period 10    PT Start Time 2202    PT Stop Time 5427    PT Time Calculation (min) 41 min    Activity Tolerance Patient tolerated treatment well    Behavior During Therapy [facility] for tasks assessed/performed             Past Medical History:  Diagnosis Date   Abnormal Pap smear of cervix    01/2015 ascus/neg- 04/2015 ascus/neg   Allergy    Anxiety    Arthritis    Asthma    Breast cancer ([place]) 2019   Chronic kidney disease    COPD (chronic obstructive pulmonary disease) ([place])    DDD (degenerative disc disease), lumbar    Depression    Elevated lipids    Fibromyalgia    Fibromyalgia    Genetic testing 03/09/2017   Multi-Cancer panel (83 genes) @ [facility] - No pathogenic mutations detected   GERD (gastroesophageal reflux disease)    History of IBS    Hyperthyroidism    Joint disease    Migraine    Migraine    Oxygen deficiency    Personal history of chemotherapy    Personal history of radiation therapy    Restless leg syndrome    Sleep apnea    Does not use C-PAP, cannot tolerate mask    Past Surgical History:  Procedure Laterality Date   ABDOMINAL HYSTERECTOMY  2008   BREAST BIOPSY Left 02/02/2017   [place] core positive   BREAST LUMPECTOMY Left 02/17/2017   BREAST LUMPECTOMY WITH NEEDLE LOCALIZATION Left 02/17/2017   Procedure: BREAST LUMPECTOMY WITH NEEDLE LOCALIZATION;  Surgeon: Vickie Epley, MD;  Location: [facility];  Service: General;  Laterality: Left;   CARPAL  TUNNEL RELEASE Bilateral    cryotherapy     DILATION AND CURETTAGE OF UTERUS     ENDOMETRIAL ABLATION     LAPAROSCOPIC OOPHERECTOMY Left    unsure which side but thinks its the left   LYSIS OF ADHESION Left 10/14/2015   Procedure: LYSIS OF ADHESION;  Surgeon: Thornton Park, MD;  Location: [facility];  Service: Orthopedics;  Laterality: Left;   NECK SURGERY     lower neck fusion rods and screws   OOPHORECTOMY     one ovary removed    PORTA CATH INSERTION N/A 03/08/2017   Procedure: PORTA CATH INSERTION;  Surgeon: Katha Cabal, MD;  Location: [facility];  Service: Cardiovascular;  Laterality: N/A;   RESECTION DISTAL CLAVICAL Left 10/14/2015   Procedure: RESECTION DISTAL CLAVICAL;  Surgeon: Thornton Park, MD;  Location: [facility];  Service: Orthopedics;  Laterality: Left;   SENTINEL NODE BIOPSY Left 02/17/2017   Procedure: SENTINEL NODE BIOPSY;  Surgeon: Vickie Epley, MD;  Location: [facility];  Service: General;  Laterality: Left;   SHOULDER ARTHROSCOPY WITH OPEN ROTATOR CUFF REPAIR Left 10/14/2015   Procedure: SHOULDER ARTHROSCOPY WITH OPEN ROTATOR CUFF REPAIR;  Surgeon: Thornton Park, MD;  Location:  [facility];  Service: Orthopedics;  Laterality: Left;   SHOULDER ARTHROSCOPY WITH OPEN ROTATOR CUFF REPAIR AND DISTAL CLAVICLE ACROMINECTOMY Right 09/03/2014   Procedure: RIGHT SHOULDER ARTHROSCOPY WITH MINI OPEN ROTATOR CUFF TEAR;  Surgeon: Thornton Park, MD;  Location: [facility];  Service: Orthopedics;  Laterality: Right;  biceps tenodesis, arthroscopic subacromial decompression and distal clavicle incision   spinal injections     SUBACROMIAL DECOMPRESSION Left 10/14/2015   Procedure: SUBACROMIAL DECOMPRESSION;  Surgeon: Thornton Park, MD;  Location: [facility];  Service: Orthopedics;  Laterality: Left;    There were no vitals filed for this visit.   Subjective Assessment - 08/10/20 1504     Subjective L shoulder is feeling sore but getting a little better. Washing her hair  was not as bad and hard. 5/10 L shoulder pain currently.    Pertinent History L shoulder pain. Pain starts from the L side of her neck which moves to her L shoulder. Pt also states having R mid low back pain. Also states having degenerative joint and disc disease. Goes to pain management. L shoulder pain began about 3 weeks ago, sudden onset, unknown mechanism of injury. Feels tight L posteror shoulder. Felt ok about 3 days ago but a day or two later, pain returned. Pt is mainly R hand dominant. Does not understand what is going on. L shoulder was worse after the surgery and does not understand it.    Patient Stated Goals Decrease pain, move better with less pain.    Currently in Pain? Yes    Pain Score 5     Pain Onset 1 to 4 weeks ago                                       PT Education - 08/10/20 1518     Education provided Yes    Education Details ther-ex    [employer]) Educated Patient    Methods Explanation;Demonstration;Tactile cues;Verbal cues    Comprehension Returned demonstration;Verbalized understanding            Objective       Latex allergies   Nothing tight for L arm (such as blood pressure cuff) secondary breast CA and surgery.    Hx of B rotator cuff surgery and disc repair in neck.    [employer]   Muscle tension L upper trap   [employer] Access Code [email or web address]   Therapeutic exercise   Seated B scapular retraction 10x3 with 5 second holds  Seated chin tucks 10x3  Seated L shoulder IR with elbow flexed, PT manual resistance 10x3 with 5 seconds holds   Seated manually resisted L forearm extension isometrics in 75 degrees elbow flexion, PT manual resistance 10x3 with 5 second holds              Posterior glide of humeral head palpated    Seated manually resisted L scapular depression isometrics 10x5 seconds for 2 sets   Improved exercise technique, movement at target joints, use of target muscles after mod verbal, visual, tactile  cues.       Manual therapy Seated STM L infraspinatus muscle to decrease tension         seated STM L cervical paraspinal and upper trap muscle to decrease tension                 Response to treatment Pt tolerated session well without aggravation of symptoms.  Clinical impression Gradually decreasing starting shoulder pain based on previous sessions. Continued working on posterior glide of humeral head, scapular strengthening and to promote mechanics as well as decreasing upper trap and cervical paraspinal muscle tension to promote ability to raise her arm up more comfortably. Pt tolerated session well without aggravation of symptoms. Pt will benefit from continued skilled physical therapy services to decrease pain, improve strength and function.         PT Short Term Goals - 07/29/20 1812       PT SHORT TERM GOAL #1   Title Pt will be independent with her initial HEP to decrease pain, improve strength, ROM, function, and ability to reach more comfortably.    Time 3    Period Weeks    Status New    Target Date 08/20/20               PT Long Term Goals - 07/29/20 1819       PT LONG TERM GOAL #1   Title Pt will have a decrease in L shoulder joint pain to 3/10 or less at worst to promote ability to raise her arm, reach, and carry items more comfortably.    Baseline 8/10 L shoulder pain at most for the past 2 weeks (07/29/2020)    Time 8    Period Weeks    Status New    Target Date 09/24/20      PT LONG TERM GOAL #2   Title Pt will have a decrease in L lateral neck pain to 3/10 or less at worst to promote ability to raise her arm, reach, and carry items more comfortably.    Baseline 8/10 L lateral neck pain at most for the past 2 weeks (07/29/2020)    Time 8    Period Weeks    Status New    Target Date 09/24/20      PT LONG TERM GOAL #3   Title Pt will improve L shoulder flexion AROM to 140 degrees or more to promote ability to raise her arm and reach more  comfortably.    Baseline 112 degrees L shoulder flexion AROM with pain (07/29/2020)    Time 8    Period Weeks    Status New    Target Date 09/24/20      PT LONG TERM GOAL #4   Title Pt will improve L shoulder flexion, abduction, ER, IR strength by at least 1/2 MMT grade to promote ability so use her L UE for functional tasks more comfortably.    Baseline 4/5 L shoulder strength all planes (07/29/2020)    Time 8    Period Weeks    Status New    Target Date 09/24/20      PT LONG TERM GOAL #5   Title Pt will improve L shoulder FOTO score by at least 10 points as a demonstration of improved function.    Baseline L shoulder FOTO 53 (07/29/2020)    Time 8    Period Weeks    Status New    Target Date 09/24/20                   Plan - 08/10/20 1519     Clinical Impression Statement Gradually decreasing starting shoulder pain based on previous sessions. Continued working on posterior glide of humeral head, scapular strengthening and to promote mechanics as well as decreasing upper trap and cervical paraspinal muscle tension to promote ability to raise her arm up more  comfortably. Pt tolerated session well without aggravation of symptoms. Pt will benefit from continued skilled physical therapy services to decrease pain, improve strength and function.    Personal Factors and Comorbidities Comorbidity 3+;Time since onset of injury/illness/exacerbation;Past/Current Experience    Comorbidities DDD, depression, fibromyalgia, anxiety, COPD, DDD, hx of breast CA S/P surgery, neck surgery    Examination-Activity Limitations Hygiene/Grooming;Lift;Reach Overhead;Carry    Stability/Clinical Decision Making Stable/Uncomplicated    Rehab Potential Fair    Clinical Impairments Affecting Rehab Potential multiple co-morbidities    PT Frequency 2x / week    PT Duration 8 weeks    PT Treatment/Interventions Therapeutic activities;Therapeutic exercise;Neuromuscular re-education;Patient/family  education;Manual techniques;Dry needling;Electrical Stimulation;Iontophoresis 4mg /ml Dexamethasone    PT Next Visit Plan Posture, scapular and shoulder strength, ROM, manual techniques, modalities PRN    Consulted and Agree with Plan of Care Patient             Patient will benefit from skilled therapeutic intervention in order to improve the following deficits and impairments:  Pain, Improper body mechanics, Decreased strength  Visit Diagnosis: Acute pain of left shoulder  Cervicalgia     Problem List Patient Active Problem List   Diagnosis Date Noted   Genetic testing 03/09/2017   Rotator cuff tear 02/07/2017   Shoulder pain 02/07/2017   Malignant neoplasm of upper-outer quadrant of left breast in female, estrogen receptor positive ([place]) 02/07/2017   History of hysterectomy 03/16/2016   Rotator cuff (capsule) sprain 10/15/2015   S/P rotator cuff repair 10/14/2015   Atypical squamous cells of undetermined significance on cytologic smear of cervix (ASC-US) 05/13/2015   DDD (degenerative disc disease), cervical 07/21/2014   Status post cervical spinal fusion 07/21/2014   DDD (degenerative disc disease), thoracic 07/21/2014   DDD (degenerative disc disease), lumbar 07/21/2014   Facet syndrome, lumbar 07/21/2014   Sacroiliac joint dysfunction 07/21/2014   Occipital neuralgia 07/21/2014   DJD of shoulder 07/21/2014   Intractable migraine with aura without status migrainosus 02/27/2014   Bipolar disorder ([place]) 02/17/2014   CAFL (chronic airflow limitation) ([place]) 02/17/2014   Fibrositis 02/17/2014   Cephalalgia 02/17/2014   HLD (hyperlipidemia) 02/17/2014   Impaired renal function 02/17/2014   Restless leg 02/17/2014   Apnea, sleep 02/17/2014   Chronic pain associated with significant psychosocial dysfunction 11/28/2013   Joneen Boers PT, DPT   08/10/2020, 6:35 PM  [facility] [address], [place], [address] Phone: [phone]   Fax:  [phone]  Name: ZEN FELLING MRN: [id number] Date of Birth: 04/17/1965

## 2020-08-12 ENCOUNTER — Ambulatory Visit: Payer: Medicare Other

## 2020-08-12 DIAGNOSIS — M542 Cervicalgia: Secondary | ICD-10-CM

## 2020-08-12 DIAGNOSIS — M25512 Pain in left shoulder: Secondary | ICD-10-CM

## 2020-08-12 NOTE — Therapy (Signed)
[place] [address] [place], [place], [address] Phone: [phone]   Fax:  [phone]  Physical Therapy Treatment  Patient Details  Name: Briana [name] MRN: [id number] Date of Birth: Dec 18, 1965 Referring Provider (PT): Gayland Curry, [place]   Encounter Date: 08/12/2020   PT End of Session - 08/12/20 1502     Visit Number 4    Number of Visits 17    Date for PT Re-Evaluation 09/24/20    Authorization Type 4    Authorization Time Period 10    PT Start Time 1502    PT Stop Time 1194    PT Time Calculation (min) 41 min    Activity Tolerance Patient tolerated treatment well    Behavior During Therapy [facility] for tasks assessed/performed             Past Medical History:  Diagnosis Date   Abnormal Pap smear of cervix    01/2015 ascus/neg- 04/2015 ascus/neg   Allergy    Anxiety    Arthritis    Asthma    Breast cancer ([place]) 2019   Chronic kidney disease    COPD (chronic obstructive pulmonary disease) ([place])    DDD (degenerative disc disease), lumbar    Depression    Elevated lipids    Fibromyalgia    Fibromyalgia    Genetic testing 03/09/2017   Multi-Cancer panel (83 genes) @ [facility] - No pathogenic mutations detected   GERD (gastroesophageal reflux disease)    History of IBS    Hyperthyroidism    Joint disease    Migraine    Migraine    Oxygen deficiency    Personal history of chemotherapy    Personal history of radiation therapy    Restless leg syndrome    Sleep apnea    Does not use C-PAP, cannot tolerate mask    Past Surgical History:  Procedure Laterality Date   ABDOMINAL HYSTERECTOMY  2008   BREAST BIOPSY Left 02/02/2017   [place] core positive   BREAST LUMPECTOMY Left 02/17/2017   BREAST LUMPECTOMY WITH NEEDLE LOCALIZATION Left 02/17/2017   Procedure: BREAST LUMPECTOMY WITH NEEDLE LOCALIZATION;  Surgeon: Vickie Epley, MD;  Location: [facility];  Service: General;  Laterality: Left;   CARPAL  TUNNEL RELEASE Bilateral    cryotherapy     DILATION AND CURETTAGE OF UTERUS     ENDOMETRIAL ABLATION     LAPAROSCOPIC OOPHERECTOMY Left    unsure which side but thinks its the left   LYSIS OF ADHESION Left 10/14/2015   Procedure: LYSIS OF ADHESION;  Surgeon: Thornton Park, MD;  Location: [facility];  Service: Orthopedics;  Laterality: Left;   NECK SURGERY     lower neck fusion rods and screws   OOPHORECTOMY     one ovary removed    PORTA CATH INSERTION N/A 03/08/2017   Procedure: PORTA CATH INSERTION;  Surgeon: Katha Cabal, MD;  Location: [place];  Service: Cardiovascular;  Laterality: N/A;   RESECTION DISTAL CLAVICAL Left 10/14/2015   Procedure: RESECTION DISTAL CLAVICAL;  Surgeon: Thornton Park, MD;  Location: [facility];  Service: Orthopedics;  Laterality: Left;   SENTINEL NODE BIOPSY Left 02/17/2017   Procedure: SENTINEL NODE BIOPSY;  Surgeon: Vickie Epley, MD;  Location: [facility];  Service: General;  Laterality: Left;   SHOULDER ARTHROSCOPY WITH OPEN ROTATOR CUFF REPAIR Left 10/14/2015   Procedure: SHOULDER ARTHROSCOPY WITH OPEN ROTATOR CUFF REPAIR;  Surgeon: Thornton Park, MD;  Location:  [facility];  Service: Orthopedics;  Laterality: Left;   SHOULDER ARTHROSCOPY WITH OPEN ROTATOR CUFF REPAIR AND DISTAL CLAVICLE ACROMINECTOMY Right 09/03/2014   Procedure: RIGHT SHOULDER ARTHROSCOPY WITH MINI OPEN ROTATOR CUFF TEAR;  Surgeon: Thornton Park, MD;  Location: [facility];  Service: Orthopedics;  Laterality: Right;  biceps tenodesis, arthroscopic subacromial decompression and distal clavicle incision   spinal injections     SUBACROMIAL DECOMPRESSION Left 10/14/2015   Procedure: SUBACROMIAL DECOMPRESSION;  Surgeon: Thornton Park, MD;  Location: [facility];  Service: Orthopedics;  Laterality: Left;    There were no vitals filed for this visit.   Subjective Assessment - 08/12/20 1502     Subjective L lateral neck is killing her when it rains. 6.5/10 L lateral neck pain  currently, 5/10 L anterior shoulder pain currently.    Pertinent History L shoulder pain. Pain starts from the L side of her neck which moves to her L shoulder. Pt also states having R mid low back pain. Also states having degenerative joint and disc disease. Goes to pain management. L shoulder pain began about 3 weeks ago, sudden onset, unknown mechanism of injury. Feels tight L posteror shoulder. Felt ok about 3 days ago but a day or two later, pain returned. Pt is mainly R hand dominant. Does not understand what is going on. L shoulder was worse after the surgery and does not understand it.    Patient Stated Goals Decrease pain, move better with less pain.    Currently in Pain? Yes    Pain Score 6     Pain Onset 1 to 4 weeks ago                                       PT Education - 08/12/20 1503     Education provided Yes    Education Details ther-ex    [employer]) Educated Patient    Methods Explanation;Demonstration;Tactile cues;Verbal cues    Comprehension Returned demonstration;Verbalized understanding           Objective       Latex allergies   Nothing tight for L arm (such as blood pressure cuff) secondary breast CA and surgery.    Hx of B rotator cuff surgery and disc repair in neck.    [employer]   Muscle tension L upper trap   [employer] Access Code [email or web address]   Therapeutic exercise   Scapular retraction, emphasis on proper movement  L 10x5 seconds for 2 sets  R 10x5 seconds for 2 sets  Decreased L lateral neck pain.   Seated chin tucks 10x3    Seated manually resisted L forearm extension isometrics in 75 degrees elbow flexion, PT manual resistance 10x3 with 5 second holds             Posterior glide of humeral head palpated  Seated L shoulder IR with elbow flexed, PT manual resistance 10x3 with 5 seconds holds  Decreased L shoulder and L lateral neck pain after aforementioned exercises.     Seated manually resisted L scapular  depression isometrics 10x5 seconds for 3 sets    Improved exercise technique, movement at target joints, use of target muscles after mod verbal, visual, tactile cues.       Manual therapy Seated STM L infraspinatus muscle to decrease tension         seated STM L cervical paraspinal and upper trap muscle to decrease tension  Response to treatment Pt tolerated session well without aggravation of symptoms.     Clinical impression  Continued working on improving lower trap muscle strength, scapular mechanics and decreasing upper trap and cervical paraspinal muscle tension to decrease stress to neck and shoulder. Pt states L neck and shoulder feel 90% better after session Pt will benefit from continued skilled physical therapy services to decrease pain, improve strength and function.         PT Short Term Goals - 07/29/20 1812       PT SHORT TERM GOAL #1   Title Pt will be independent with her initial HEP to decrease pain, improve strength, ROM, function, and ability to reach more comfortably.    Time 3    Period Weeks    Status New    Target Date 08/20/20               PT Long Term Goals - 07/29/20 1819       PT LONG TERM GOAL #1   Title Pt will have a decrease in L shoulder joint pain to 3/10 or less at worst to promote ability to raise her arm, reach, and carry items more comfortably.    Baseline 8/10 L shoulder pain at most for the past 2 weeks (07/29/2020)    Time 8    Period Weeks    Status New    Target Date 09/24/20      PT LONG TERM GOAL #2   Title Pt will have a decrease in L lateral neck pain to 3/10 or less at worst to promote ability to raise her arm, reach, and carry items more comfortably.    Baseline 8/10 L lateral neck pain at most for the past 2 weeks (07/29/2020)    Time 8    Period Weeks    Status New    Target Date 09/24/20      PT LONG TERM GOAL #3   Title Pt will improve L shoulder flexion AROM to 140 degrees or more to  promote ability to raise her arm and reach more comfortably.    Baseline 112 degrees L shoulder flexion AROM with pain (07/29/2020)    Time 8    Period Weeks    Status New    Target Date 09/24/20      PT LONG TERM GOAL #4   Title Pt will improve L shoulder flexion, abduction, ER, IR strength by at least 1/2 MMT grade to promote ability so use her L UE for functional tasks more comfortably.    Baseline 4/5 L shoulder strength all planes (07/29/2020)    Time 8    Period Weeks    Status New    Target Date 09/24/20      PT LONG TERM GOAL #5   Title Pt will improve L shoulder FOTO score by at least 10 points as a demonstration of improved function.    Baseline L shoulder FOTO 53 (07/29/2020)    Time 8    Period Weeks    Status New    Target Date 09/24/20                   Plan - 08/12/20 1501     Clinical Impression Statement Continued working on improving lower trap muscle strength, scapular mechanics and decreasing upper trap and cervical paraspinal muscle tension to decrease stress to neck and shoulder. Pt states L neck and shoulder feel 90% better after session Pt will benefit from continued  skilled physical therapy services to decrease pain, improve strength and function.    Personal Factors and Comorbidities Comorbidity 3+;Time since onset of injury/illness/exacerbation;Past/Current Experience    Comorbidities DDD, depression, fibromyalgia, anxiety, COPD, DDD, hx of breast CA S/P surgery, neck surgery    Examination-Activity Limitations Hygiene/Grooming;Lift;Reach Overhead;Carry    Stability/Clinical Decision Making Stable/Uncomplicated    Rehab Potential Fair    Clinical Impairments Affecting Rehab Potential multiple co-morbidities    PT Frequency 2x / week    PT Duration 8 weeks    PT Treatment/Interventions Therapeutic activities;Therapeutic exercise;Neuromuscular re-education;Patient/family education;Manual techniques;Dry needling;Electrical Stimulation;Iontophoresis  4mg /ml Dexamethasone    PT Next Visit Plan Posture, scapular and shoulder strength, ROM, manual techniques, modalities PRN    Consulted and Agree with Plan of Care Patient             Patient will benefit from skilled therapeutic intervention in order to improve the following deficits and impairments:  Pain, Improper body mechanics, Decreased strength  Visit Diagnosis: Acute pain of left shoulder  Cervicalgia     Problem List Patient Active Problem List   Diagnosis Date Noted   Genetic testing 03/09/2017   Rotator cuff tear 02/07/2017   Shoulder pain 02/07/2017   Malignant neoplasm of upper-outer quadrant of left breast in female, estrogen receptor positive ([place]) 02/07/2017   History of hysterectomy 03/16/2016   Rotator cuff (capsule) sprain 10/15/2015   S/P rotator cuff repair 10/14/2015   Atypical squamous cells of undetermined significance on cytologic smear of cervix ([facility]-US) 05/13/2015   DDD (degenerative disc disease), cervical 07/21/2014   Status post cervical spinal fusion 07/21/2014   DDD (degenerative disc disease), thoracic 07/21/2014   DDD (degenerative disc disease), lumbar 07/21/2014   Facet syndrome, lumbar 07/21/2014   Sacroiliac joint dysfunction 07/21/2014   Occipital neuralgia 07/21/2014   DJD of shoulder 07/21/2014   Intractable migraine with aura without status migrainosus 02/27/2014   Bipolar disorder ([place]) 02/17/2014   CAFL (chronic airflow limitation) ([place]) 02/17/2014   Fibrositis 02/17/2014   Cephalalgia 02/17/2014   HLD (hyperlipidemia) 02/17/2014   Impaired renal function 02/17/2014   Restless leg 02/17/2014   Apnea, sleep 02/17/2014   Chronic pain associated with significant psychosocial dysfunction 11/28/2013    Joneen Boers PT, DPT   08/12/2020, 8:58 PM  [facility] [address] [place], [address] Phone: [phone]   Fax:  [phone]  Name: NOLYN EILERT MRN: [id number] Date of Birth: November 10, 1965

## 2020-08-17 ENCOUNTER — Ambulatory Visit: Payer: Medicare Other

## 2020-08-17 DIAGNOSIS — M542 Cervicalgia: Secondary | ICD-10-CM

## 2020-08-17 DIAGNOSIS — M25512 Pain in left shoulder: Secondary | ICD-10-CM | POA: Diagnosis not present

## 2020-08-17 NOTE — Therapy (Signed)
[facility] [address], [place], [address] Phone: [phone]   Fax:  [phone]  Physical Therapy Treatment  Patient Details  Name: Briana [name] MRN: [id number] Date of Birth: Apr 09, 1965 Referring Provider (PT): Gayland Curry, [place]   Encounter Date: 08/17/2020   PT End of Session - 08/17/20 1503     Visit Number 5    Number of Visits 17    Date for PT Re-Evaluation 09/24/20    Authorization Type 5    Authorization Time Period 10    PT Start Time 6160    PT Stop Time 7371    PT Time Calculation (min) 41 min    Activity Tolerance Patient tolerated treatment well    Behavior During Therapy [facility] for tasks assessed/performed             Past Medical History:  Diagnosis Date   Abnormal Pap smear of cervix    01/2015 ascus/neg- 04/2015 ascus/neg   Allergy    Anxiety    Arthritis    Asthma    Breast cancer ([place]) 2019   Chronic kidney disease    COPD (chronic obstructive pulmonary disease) ([place])    DDD (degenerative disc disease), lumbar    Depression    Elevated lipids    Fibromyalgia    Fibromyalgia    Genetic testing 03/09/2017   Multi-Cancer panel (83 genes) @ [facility] - No pathogenic mutations detected   GERD (gastroesophageal reflux disease)    History of IBS    Hyperthyroidism    Joint disease    Migraine    Migraine    Oxygen deficiency    Personal history of chemotherapy    Personal history of radiation therapy    Restless leg syndrome    Sleep apnea    Does not use C-PAP, cannot tolerate mask    Past Surgical History:  Procedure Laterality Date   ABDOMINAL HYSTERECTOMY  2008   BREAST BIOPSY Left 02/02/2017   [place] core positive   BREAST LUMPECTOMY Left 02/17/2017   BREAST LUMPECTOMY WITH NEEDLE LOCALIZATION Left 02/17/2017   Procedure: BREAST LUMPECTOMY WITH NEEDLE LOCALIZATION;  Surgeon: Vickie Epley, MD;  Location: [facility];  Service: General;  Laterality: Left;   CARPAL  TUNNEL RELEASE Bilateral    cryotherapy     DILATION AND CURETTAGE OF UTERUS     ENDOMETRIAL ABLATION     LAPAROSCOPIC OOPHERECTOMY Left    unsure which side but thinks its the left   LYSIS OF ADHESION Left 10/14/2015   Procedure: LYSIS OF ADHESION;  Surgeon: Thornton Park, MD;  Location: [facility] OR[facility];  Service: Orthopedics;  Laterality: Left;   NECK SURGERY     lower neck fusion rods and screws   OOPHORECTOMY     one ovary removed    PORTA CATH INSERTION N/A 03/08/2017   Procedure: PORTA CATH INSERTION;  Surgeon: Katha Cabal, MD;  Location: [place] [facility];  Service: Cardiovascular;  Laterality: N/A;   RESECTION DISTAL CLAVICAL Left 10/14/2015   Procedure: RESECTION DISTAL CLAVICAL;  Surgeon: Thornton Park, MD;  Location: [facility];  Service: Orthopedics;  Laterality: Left;   SENTINEL NODE BIOPSY Left 02/17/2017   Procedure: SENTINEL NODE BIOPSY;  Surgeon: Vickie Epley, MD;  Location: [facility];  Service: General;  Laterality: Left;   SHOULDER ARTHROSCOPY WITH OPEN ROTATOR CUFF REPAIR Left 10/14/2015   Procedure: SHOULDER ARTHROSCOPY WITH OPEN ROTATOR CUFF REPAIR;  Surgeon: Thornton Park, MD;  Location:  [facility];  Service: Orthopedics;  Laterality: Left;   SHOULDER ARTHROSCOPY WITH OPEN ROTATOR CUFF REPAIR AND DISTAL CLAVICLE ACROMINECTOMY Right 09/03/2014   Procedure: RIGHT SHOULDER ARTHROSCOPY WITH MINI OPEN ROTATOR CUFF TEAR;  Surgeon: Thornton Park, MD;  Location: [facility];  Service: Orthopedics;  Laterality: Right;  biceps tenodesis, arthroscopic subacromial decompression and distal clavicle incision   spinal injections     SUBACROMIAL DECOMPRESSION Left 10/14/2015   Procedure: SUBACROMIAL DECOMPRESSION;  Surgeon: Thornton Park, MD;  Location: [facility];  Service: Orthopedics;  Laterality: Left;    There were no vitals filed for this visit.   Subjective Assessment - 08/17/20 1504     Subjective Has been running around all day. L shoulder is at 4/10 currently.     Pertinent History L shoulder pain. Pain starts from the L side of her neck which moves to her L shoulder. Pt also states having R mid low back pain. Also states having degenerative joint and disc disease. Goes to pain management. L shoulder pain began about 3 weeks ago, sudden onset, unknown mechanism of injury. Feels tight L posteror shoulder. Felt ok about 3 days ago but a day or two later, pain returned. Pt is mainly R hand dominant. Does not understand what is going on. L shoulder was worse after the surgery and does not understand it.    Patient Stated Goals Decrease pain, move better with less pain.    Currently in Pain? Yes    Pain Score 4     Pain Onset More than a month ago                                       PT Education - 08/17/20 1506     Education provided Yes    Education Details ther-ex    [employer]) Educated Patient    Methods Explanation;Demonstration;Tactile cues;Verbal cues    Comprehension Returned demonstration;Verbalized understanding            Objective       Latex allergies   Nothing tight for L arm (such as blood pressure cuff) secondary breast CA and surgery.    Hx of B rotator cuff surgery and disc repair in neck.    [employer]   Muscle tension L upper trap   [employer] Access Code [email or web address]   Therapeutic exercise   Seated chin tucks 10x3   Standing B scapular retraction green band 10x2 with 5 second holds  Seated L triceps extension isometrics, L forearm pressing against arm rest 10x3 with 5 second holds    Seated L shoulder IR with elbow flexed, PT manual resistance 10x3 with 5 seconds holds     Seated manually resisted L scapular depression isometrics 10x5 seconds for 3 sets   Seated scapular retraction manually resisted targeting the lower trap   L 10x5 seconds for 3 sets    Improved exercise technique, movement at target joints, use of target muscles after mod verbal, visual, tactile cues.        Manual therapy Seated STM L infraspinatus muscle to decrease tension         seated STM L and R  cervical paraspinal and R upper trap muscle to decrease tension                  Response to treatment Pt tolerated session well without aggravation of symptoms. Decreased L shoulder pain to 1/10 after  session reported by pt.      Clinical impression Decreasing starting shoulder pain levels based on subjective reports. Continued working on scapular, as well as posterior shoulder strength to promote posterior glide of L humeral head to decrease impingement. Continued working on decreasing cervical paraspinal and upper trap muscle tension to promote better glenohumeral mechanics as well. Decreased L shoulder pain reported after session. Pt will benefit from continued skilled physical therapy services to decrease pain, improve strength and function.         PT Short Term Goals - 07/29/20 1812       PT SHORT TERM GOAL #1   Title Pt will be independent with her initial HEP to decrease pain, improve strength, ROM, function, and ability to reach more comfortably.    Time 3    Period Weeks    Status New    Target Date 08/20/20               PT Long Term Goals - 07/29/20 1819       PT LONG TERM GOAL #1   Title Pt will have a decrease in L shoulder joint pain to 3/10 or less at worst to promote ability to raise her arm, reach, and carry items more comfortably.    Baseline 8/10 L shoulder pain at most for the past 2 weeks (07/29/2020)    Time 8    Period Weeks    Status New    Target Date 09/24/20      PT LONG TERM GOAL #2   Title Pt will have a decrease in L lateral neck pain to 3/10 or less at worst to promote ability to raise her arm, reach, and carry items more comfortably.    Baseline 8/10 L lateral neck pain at most for the past 2 weeks (07/29/2020)    Time 8    Period Weeks    Status New    Target Date 09/24/20      PT LONG TERM GOAL #3   Title Pt will improve L shoulder  flexion AROM to 140 degrees or more to promote ability to raise her arm and reach more comfortably.    Baseline 112 degrees L shoulder flexion AROM with pain (07/29/2020)    Time 8    Period Weeks    Status New    Target Date 09/24/20      PT LONG TERM GOAL #4   Title Pt will improve L shoulder flexion, abduction, ER, IR strength by at least 1/2 MMT grade to promote ability so use her L UE for functional tasks more comfortably.    Baseline 4/5 L shoulder strength all planes (07/29/2020)    Time 8    Period Weeks    Status New    Target Date 09/24/20      PT LONG TERM GOAL #5   Title Pt will improve L shoulder FOTO score by at least 10 points as a demonstration of improved function.    Baseline L shoulder FOTO 53 (07/29/2020)    Time 8    Period Weeks    Status New    Target Date 09/24/20                   Plan - 08/17/20 1503     Clinical Impression Statement Decreasing starting shoulder pain levels based on subjective reports. Continued working on scapular, as well as posterior shoulder strength to promote posterior glide of L humeral head to decrease impingement. Continued  working on decreasing cervical paraspinal and upper trap muscle tension to promote better glenohumeral mechanics as well. Decreased L shoulder pain reported after session. Pt will benefit from continued skilled physical therapy services to decrease pain, improve strength and function.    Personal Factors and Comorbidities Comorbidity 3+;Time since onset of injury/illness/exacerbation;Past/Current Experience    Comorbidities DDD, depression, fibromyalgia, anxiety, COPD, DDD, hx of breast CA S/P surgery, neck surgery    Examination-Activity Limitations Hygiene/Grooming;Lift;Reach Overhead;Carry    Stability/Clinical Decision Making Stable/Uncomplicated    Rehab Potential Fair    Clinical Impairments Affecting Rehab Potential multiple co-morbidities    PT Frequency 2x / week    PT Duration 8 weeks    PT  Treatment/Interventions Therapeutic activities;Therapeutic exercise;Neuromuscular re-education;Patient/family education;Manual techniques;Dry needling;Electrical Stimulation;Iontophoresis 4mg /ml Dexamethasone    PT Next Visit Plan Posture, scapular and shoulder strength, ROM, manual techniques, modalities PRN    Consulted and Agree with Plan of Care Patient             Patient will benefit from skilled therapeutic intervention in order to improve the following deficits and impairments:  Pain, Improper body mechanics, Decreased strength  Visit Diagnosis: Acute pain of left shoulder  Cervicalgia     Problem List Patient Active Problem List   Diagnosis Date Noted   Genetic testing 03/09/2017   Rotator cuff tear 02/07/2017   Shoulder pain 02/07/2017   Malignant neoplasm of upper-outer quadrant of left breast in female, estrogen receptor positive ([place]) 02/07/2017   History of hysterectomy 03/16/2016   Rotator cuff (capsule) sprain 10/15/2015   S/P rotator cuff repair 10/14/2015   Atypical squamous cells of undetermined significance on cytologic smear of cervix (ASC-US) 05/13/2015   DDD (degenerative disc disease), cervical 07/21/2014   Status post cervical spinal fusion 07/21/2014   DDD (degenerative disc disease), thoracic 07/21/2014   DDD (degenerative disc disease), lumbar 07/21/2014   Facet syndrome, lumbar 07/21/2014   Sacroiliac joint dysfunction 07/21/2014   Occipital neuralgia 07/21/2014   DJD of shoulder 07/21/2014   Intractable migraine with aura without status migrainosus 02/27/2014   Bipolar disorder ([place]) 02/17/2014   CAFL (chronic airflow limitation) ([place]) 02/17/2014   Fibrositis 02/17/2014   Cephalalgia 02/17/2014   HLD (hyperlipidemia) 02/17/2014   Impaired renal function 02/17/2014   Restless leg 02/17/2014   Apnea, sleep 02/17/2014   Chronic pain associated with significant psychosocial dysfunction 11/28/2013    Joneen Boers PT, DPT  08/17/2020, 3:53  PM  [facility] [address], [place], [address] Phone: [phone]   Fax:  [phone]  Name: MINAAL STRUCKMAN MRN: [id number] Date of Birth: Oct 13, 1965

## 2020-08-18 DIAGNOSIS — M5030 Other cervical disc degeneration, unspecified cervical region: Secondary | ICD-10-CM | POA: Diagnosis not present

## 2020-08-18 DIAGNOSIS — G894 Chronic pain syndrome: Secondary | ICD-10-CM | POA: Diagnosis not present

## 2020-08-18 DIAGNOSIS — M5136 Other intervertebral disc degeneration, lumbar region: Secondary | ICD-10-CM | POA: Diagnosis not present

## 2020-08-18 DIAGNOSIS — M179 Osteoarthritis of knee, unspecified: Secondary | ICD-10-CM | POA: Diagnosis not present

## 2020-08-19 ENCOUNTER — Ambulatory Visit: Payer: Medicare Other

## 2020-08-19 DIAGNOSIS — M25512 Pain in left shoulder: Secondary | ICD-10-CM

## 2020-08-19 DIAGNOSIS — M542 Cervicalgia: Secondary | ICD-10-CM

## 2020-08-19 NOTE — Therapy (Signed)
[facility] [address] [place], [address] Phone: [phone]   Fax:  [phone]  Physical Therapy Treatment  Patient Details  Name: Briana [name] MRN: [id number] Date of Birth: March 19, 1965 Referring Provider (PT): Harlin Rain, [place]   Encounter Date: 08/19/2020   PT End of Session - 08/19/20 1421     Visit Number 6    Number of Visits 17    Date for PT Re-Evaluation 09/24/20    Authorization Type [employer] Medicare: [place]    Authorization Time Period Cert 1/51/76-1/60/73    PT Start Time 1414    PT Stop Time 1454    PT Time Calculation (min) 40 min    Activity Tolerance Patient tolerated treatment well;No increased pain    Behavior During Therapy [facility] for tasks assessed/performed             Past Medical History:  Diagnosis Date   Abnormal Pap smear of cervix    01/2015 ascus/neg- 04/2015 ascus/neg   Allergy    Anxiety    Arthritis    Asthma    Breast cancer ([facility]) 2019   Chronic kidney disease    COPD (chronic obstructive pulmonary disease) ([facility])    DDD (degenerative disc disease), lumbar    Depression    Elevated lipids    Fibromyalgia    Fibromyalgia    Genetic testing 03/09/2017   Multi-Cancer panel (83 genes) @ [facility] - No pathogenic mutations detected   GERD (gastroesophageal reflux disease)    History of IBS    Hyperthyroidism    Joint disease    Migraine    Migraine    Oxygen deficiency    Personal history of chemotherapy    Personal history of radiation therapy    Restless leg syndrome    Sleep apnea    Does not use C-PAP, cannot tolerate mask    Past Surgical History:  Procedure Laterality Date   ABDOMINAL HYSTERECTOMY  2008   BREAST BIOPSY Left 02/02/2017   [place] core positive   BREAST LUMPECTOMY Left 02/17/2017   BREAST LUMPECTOMY WITH NEEDLE LOCALIZATION Left 02/17/2017   Procedure: BREAST LUMPECTOMY WITH NEEDLE LOCALIZATION;  Surgeon: Ancil Linsey, MD;  Location:  [facility];  Service: General;  Laterality: Left;   CARPAL TUNNEL RELEASE Bilateral    cryotherapy     DILATION AND CURETTAGE OF UTERUS     ENDOMETRIAL ABLATION     LAPAROSCOPIC OOPHERECTOMY Left    unsure which side but thinks its the left   LYSIS OF ADHESION Left 10/14/2015   Procedure: LYSIS OF ADHESION;  Surgeon: Juanell Fairly, MD;  Location: [facility];  Service: Orthopedics;  Laterality: Left;   NECK SURGERY     lower neck fusion rods and screws   OOPHORECTOMY     one ovary removed    PORTA CATH INSERTION N/A 03/08/2017   Procedure: PORTA CATH INSERTION;  Surgeon: Renford Dills, MD;  Location: [facility];  Service: Cardiovascular;  Laterality: N/A;   RESECTION DISTAL CLAVICAL Left 10/14/2015   Procedure: RESECTION DISTAL CLAVICAL;  Surgeon: Juanell Fairly, MD;  Location: [facility];  Service: Orthopedics;  Laterality: Left;   SENTINEL NODE BIOPSY Left 02/17/2017   Procedure: SENTINEL NODE BIOPSY;  Surgeon: Ancil Linsey, MD;  Location: [facility];  Service: General;  Laterality: Left;   SHOULDER ARTHROSCOPY WITH OPEN ROTATOR CUFF REPAIR Left 10/14/2015   Procedure: SHOULDER ARTHROSCOPY WITH OPEN ROTATOR CUFF REPAIR;  Surgeon: Juanell Fairly, MD;  Location: [facility];  Service: Orthopedics;  Laterality: Left;   SHOULDER ARTHROSCOPY WITH OPEN ROTATOR CUFF REPAIR AND DISTAL CLAVICLE ACROMINECTOMY Right 09/03/2014   Procedure: RIGHT SHOULDER ARTHROSCOPY WITH MINI OPEN ROTATOR CUFF TEAR;  Surgeon: Juanell Fairly, MD;  Location: [facility];  Service: Orthopedics;  Laterality: Right;  biceps tenodesis, arthroscopic subacromial decompression and distal clavicle incision   spinal injections     SUBACROMIAL DECOMPRESSION Left 10/14/2015   Procedure: SUBACROMIAL DECOMPRESSION;  Surgeon: Juanell Fairly, MD;  Location: [facility];  Service: Orthopedics;  Laterality: Left;    There were no vitals filed for this visit.   Subjective Assessment - 08/19/20 1417     Subjective Pt reports HEP  still going well. Pt says her lyrica has been adjusted down for once daily due to weight gain this year. Pt reports pain still around 4/10.    Pertinent History L shoulder pain. Pain starts from the L side of her neck which moves to her L shoulder. Pt also states having R mid low back pain. Also states having degenerative joint and disc disease. Goes to pain management. L shoulder pain began about 3 weeks ago, sudden onset, unknown mechanism of injury. Feels tight L posteror shoulder. Felt ok about 3 days ago but a day or two later, pain returned. Pt is mainly R hand dominant. Does not understand what is going on. L shoulder was worse after the surgery and does not understand it.    Currently in Pain? Yes    Pain Score 4              INTERVENTION THIS DATE: [place] Si TB row with retraction 2x15 -BlueTB neck scarf triceps extension 2x15 -Yellow shoulder extension from 90 1x8 (too easy), Green TB 2x12   -standing shoulder ABDCT 1lb free weights 2x10  -wall pushup off treadmill rail (feet 24 inches out) 2x10 (has a pop with subsequent good feelings in shoulder)  -bilateral biceps curls 4lb 2x10    PT Short Term Goals - 07/29/20 1812       PT SHORT TERM GOAL #1   Title Pt will be independent with her initial HEP to decrease pain, improve strength, ROM, function, and ability to reach more comfortably.    Time 3    Period Weeks    Status New    Target Date 08/20/20               PT Long Term Goals - 07/29/20 1819       PT LONG TERM GOAL #1   Title Pt will have a decrease in L shoulder joint pain to 3/10 or less at worst to promote ability to raise her arm, reach, and carry items more comfortably.    Baseline 8/10 L shoulder pain at most for the past 2 weeks (07/29/2020)    Time 8    Period Weeks    Status New    Target Date 09/24/20      PT LONG TERM GOAL #2   Title Pt will have a decrease in L lateral neck pain to 3/10 or less at worst to promote ability to raise her arm,  reach, and carry items more comfortably.    Baseline 8/10 L lateral neck pain at most for the past 2 weeks (07/29/2020)    Time 8    Period Weeks    Status New    Target Date 09/24/20      PT LONG TERM GOAL #3  Title Pt will improve L shoulder flexion AROM to 140 degrees or more to promote ability to raise her arm and reach more comfortably.    Baseline 112 degrees L shoulder flexion AROM with pain (07/29/2020)    Time 8    Period Weeks    Status New    Target Date 09/24/20      PT LONG TERM GOAL #4   Title Pt will improve L shoulder flexion, abduction, ER, IR strength by at least 1/2 MMT grade to promote ability so use her L UE for functional tasks more comfortably.    Baseline 4/5 L shoulder strength all planes (07/29/2020)    Time 8    Period Weeks    Status New    Target Date 09/24/20      PT LONG TERM GOAL #5   Title Pt will improve L shoulder FOTO score by at least 10 points as a demonstration of improved function.    Baseline L shoulder FOTO 53 (07/29/2020)    Time 8    Period Weeks    Status New    Target Date 09/24/20                   Plan - 08/19/20 1425     Clinical Impression Statement Continued with current plan of care as laid out in evaluation and recent prior sessions. Pt remains motivated to advance progress toward goals. Rest breaks provided as needed, pt quick to ask when needed. Author maintains all interventions within appropriate level of intensity as not to purposefully exacerbate pain. Pt able to advance many activities to moderate intensity loading and with excellent response. Pt does require varying levels of assistance and cuing for completion of exercises for correct form and sometimes due to pain/weakness. Pt continues to demonstrate progress toward goals AEB progression of some interventions this date either in volume or intensity. No updates to HEP this date.    Personal Factors and Comorbidities Comorbidity 3+;Time since onset of  injury/illness/exacerbation;Past/Current Experience    Comorbidities DDD, depression, fibromyalgia, anxiety, COPD, DDD, hx of breast CA S/P surgery, neck surgery    Examination-Activity Limitations [place]    Stability/Clinical Decision Making Stable/Uncomplicated    Clinical Decision Making Low    Rehab Potential Fair    Clinical Impairments Affecting Rehab Potential multiple co-morbidities    PT Frequency 2x / week    PT Duration 8 weeks    PT Treatment/Interventions Therapeutic activities;Therapeutic exercise;Neuromuscular re-education;Patient/family education;Manual techniques;Dry needling;Electrical Stimulation;Iontophoresis 4mg /ml Dexamethasone    PT Next Visit Plan Posture, scapular and shoulder strength, ROM, manual techniques, modalities PRN    PT Home Exercise Plan AROM supination/pronation in painfree range; L digit composite flexion and extension; Ball squeeze     Consulted and Agree with Plan of Care Patient             Patient will benefit from skilled therapeutic intervention in order to improve the following deficits and impairments:  Pain, Improper body mechanics, Decreased strength  Visit Diagnosis: Acute pain of left shoulder  Cervicalgia     Problem List Patient Active Problem List   Diagnosis Date Noted   Genetic testing 03/09/2017   Rotator cuff tear 02/07/2017   Shoulder pain 02/07/2017   Malignant neoplasm of upper-outer quadrant of left breast in female, estrogen receptor positive ([facility]) 02/07/2017   History of hysterectomy 03/16/2016   Rotator cuff (capsule) sprain 10/15/2015   S/P rotator cuff repair 10/14/2015   Atypical squamous cells of undetermined significance  on cytologic smear of cervix ([facility]) 05/13/2015   DDD (degenerative disc disease), cervical 07/21/2014   Status post cervical spinal fusion 07/21/2014   DDD (degenerative disc disease), thoracic 07/21/2014   DDD (degenerative disc disease), lumbar 07/21/2014   Facet syndrome, lumbar  07/21/2014   Sacroiliac joint dysfunction 07/21/2014   Occipital neuralgia 07/21/2014   DJD of shoulder 07/21/2014   Intractable migraine with aura without status migrainosus 02/27/2014   Bipolar disorder ([facility]) 02/17/2014   CAFL (chronic airflow limitation) ([facility]) 02/17/2014   Fibrositis 02/17/2014   Cephalalgia 02/17/2014   HLD (hyperlipidemia) 02/17/2014   Impaired renal function 02/17/2014   Restless leg 02/17/2014   Apnea, sleep 02/17/2014   Chronic pain associated with significant psychosocial dysfunction 11/28/2013   2:46 PM, 08/19/20 Rosamaria Lints, PT, DPT Physical Therapist - [place] [phone] (Office)   Rembert Browe C 08/19/2020, 2:26 PM   [facility] [address], [place], [address] Phone: [phone]   Fax:  [phone]  Name: Briana [name] MRN: [id number] Date of Birth: 09/01/1965

## 2020-08-24 ENCOUNTER — Ambulatory Visit: Payer: Medicare Other

## 2020-08-24 DIAGNOSIS — M542 Cervicalgia: Secondary | ICD-10-CM | POA: Diagnosis not present

## 2020-08-24 DIAGNOSIS — M25512 Pain in left shoulder: Secondary | ICD-10-CM | POA: Diagnosis not present

## 2020-08-24 NOTE — Therapy (Signed)
[facility] [address] [place], [address] Phone: [phone]   Fax:  [phone]  Physical Therapy Treatment  Patient Details  Name: Briana [name] MRN: [id number] Date of Birth: 03-Nov-1965 Referring Provider (PT): Gayland Curry, [place]   Encounter Date: 08/24/2020   PT End of Session - 08/24/20 1505     Visit Number 7    Number of Visits 17    Date for PT Re-Evaluation 09/24/20    Authorization Type UHC Medicare: [place]    Authorization Time Period Cert [id number]    PT Start Time 1505    PT Stop Time [id number]    PT Time Calculation (min) 39 min    Activity Tolerance Patient tolerated treatment well;No increased pain    Behavior During Therapy [facility] for tasks assessed/performed             Past Medical History:  Diagnosis Date   Abnormal Pap smear of cervix    01/2015 ascus/neg- 04/2015 ascus/neg   Allergy    Anxiety    Arthritis    Asthma    Breast cancer ([place]) 2019   Chronic kidney disease    COPD (chronic obstructive pulmonary disease) (HCC)    DDD (degenerative disc disease), lumbar    Depression    Elevated lipids    Fibromyalgia    Fibromyalgia    Genetic testing 03/09/2017   Multi-Cancer panel (83 genes) @ [facility] - No pathogenic mutations detected   GERD (gastroesophageal reflux disease)    History of IBS    Hyperthyroidism    Joint disease    Migraine    Migraine    Oxygen deficiency    Personal history of chemotherapy    Personal history of radiation therapy    Restless leg syndrome    Sleep apnea    Does not use C-PAP, cannot tolerate mask    Past Surgical History:  Procedure Laterality Date   ABDOMINAL HYSTERECTOMY  2008   BREAST BIOPSY Left 02/02/2017   [place] core positive   BREAST LUMPECTOMY Left 02/17/2017   BREAST LUMPECTOMY WITH NEEDLE LOCALIZATION Left 02/17/2017   Procedure: BREAST LUMPECTOMY WITH NEEDLE LOCALIZATION;  Surgeon: Vickie Epley, MD;  Location:  [facility];  Service: General;  Laterality: Left;   CARPAL TUNNEL RELEASE Bilateral    cryotherapy     DILATION AND CURETTAGE OF UTERUS     ENDOMETRIAL ABLATION     LAPAROSCOPIC OOPHERECTOMY Left    unsure which side but thinks its the left   LYSIS OF ADHESION Left 10/14/2015   Procedure: LYSIS OF ADHESION;  Surgeon: Thornton Park, MD;  Location: [facility];  Service: Orthopedics;  Laterality: Left;   NECK SURGERY     lower neck fusion rods and screws   OOPHORECTOMY     one ovary removed    PORTA CATH INSERTION N/A 03/08/2017   Procedure: PORTA CATH INSERTION;  Surgeon: Katha Cabal, MD;  Location: [facility];  Service: Cardiovascular;  Laterality: N/A;   RESECTION DISTAL CLAVICAL Left 10/14/2015   Procedure: RESECTION DISTAL CLAVICAL;  Surgeon: Thornton Park, MD;  Location: [facility];  Service: Orthopedics;  Laterality: Left;   SENTINEL NODE BIOPSY Left 02/17/2017   Procedure: SENTINEL NODE BIOPSY;  Surgeon: Vickie Epley, MD;  Location: [facility];  Service: General;  Laterality: Left;   SHOULDER ARTHROSCOPY WITH OPEN ROTATOR CUFF REPAIR Left 10/14/2015   Procedure: SHOULDER ARTHROSCOPY WITH OPEN ROTATOR CUFF REPAIR;  Surgeon: Thornton Park, MD;  Location: [facility];  Service: Orthopedics;  Laterality: Left;   SHOULDER ARTHROSCOPY WITH OPEN ROTATOR CUFF REPAIR AND DISTAL CLAVICLE ACROMINECTOMY Right 09/03/2014   Procedure: RIGHT SHOULDER ARTHROSCOPY WITH MINI OPEN ROTATOR CUFF TEAR;  Surgeon: Thornton Park, MD;  Location: [facility];  Service: Orthopedics;  Laterality: Right;  biceps tenodesis, arthroscopic subacromial decompression and distal clavicle incision   spinal injections     SUBACROMIAL DECOMPRESSION Left 10/14/2015   Procedure: SUBACROMIAL DECOMPRESSION;  Surgeon: Thornton Park, MD;  Location: [facility];  Service: Orthopedics;  Laterality: Left;    There were no vitals filed for this visit.   Subjective Assessment - 08/24/20 1507     Subjective L shoulder is  about a 5/10 due to upcoming rain. In the process selling and buying a new car. Had to clean someone's pool as well which made her arms sore.    Pertinent History L shoulder pain. Pain starts from the L side of her neck which moves to her L shoulder. Pt also states having R mid low back pain. Also states having degenerative joint and disc disease. Goes to pain management. L shoulder pain began about 3 weeks ago, sudden onset, unknown mechanism of injury. Feels tight L posteror shoulder. Felt ok about 3 days ago but a day or two later, pain returned. Pt is mainly R hand dominant. Does not understand what is going on. L shoulder was worse after the surgery and does not understand it.    Currently in Pain? Yes    Pain Score 5                                        PT Education - 08/24/20 1513     Education provided Yes    Education Details ther-ex    [employer]) Educated Patient    Methods Explanation;Demonstration;Tactile cues;Verbal cues    Comprehension Returned demonstration;Verbalized understanding           Objective       Latex allergies   Nothing tight for L arm (such as blood pressure cuff) secondary breast CA and surgery.    Hx of B rotator cuff surgery and disc repair in neck.    [employer]   Muscle tension L upper trap   [employer] Access Code [email or web address]   Therapeutic exercise   Standing L triceps extension isometrics, hand on mat table, at dining room table height 10x3 with 5 second holds to promote posterior glide of humeral head  Standing L upper extremity weight shifting, hands on table 10x5 seconds    Seated manually resisted L triceps extension isometrics,  10x3 with 5 second holds   Seated scapular retraction manually resisted targeting the lower trap             L 10x5 seconds for 3 sets   Seated manually resisted L scapular depression isometrics 10x5 seconds for 3 sets  Seated chin tucks 10x      Improved exercise  technique, movement at target joints, use of target muscles after mod verbal, visual, tactile cues.       Manual therapy Seated STM L infraspinatus muscle to decrease tension         seated STM L and R  cervical paraspinal and R upper trap muscle to decrease tension  Response to treatment Pt tolerated session well without aggravation of symptoms. Decreased pain reported by pt after session.       Clinical impression Worked on promoting posterior glide of L humeral head via muscle activation as well as scapular strengthening and decreasing upper trap and cervical paraspinal muscle tension to promote better mechanics at her L shoulder. Decreased pain reported by pt after session. Pt will benefit from continued skilled physical therapy services to decrease pain, improve strength and function.           PT Short Term Goals - 07/29/20 1812       PT SHORT TERM GOAL #1   Title Pt will be independent with her initial HEP to decrease pain, improve strength, ROM, function, and ability to reach more comfortably.    Time 3    Period Weeks    Status New    Target Date 08/20/20               PT Long Term Goals - 07/29/20 1819       PT LONG TERM GOAL #1   Title Pt will have a decrease in L shoulder joint pain to 3/10 or less at worst to promote ability to raise her arm, reach, and carry items more comfortably.    Baseline 8/10 L shoulder pain at most for the past 2 weeks (07/29/2020)    Time 8    Period Weeks    Status New    Target Date 09/24/20      PT LONG TERM GOAL #2   Title Pt will have a decrease in L lateral neck pain to 3/10 or less at worst to promote ability to raise her arm, reach, and carry items more comfortably.    Baseline 8/10 L lateral neck pain at most for the past 2 weeks (07/29/2020)    Time 8    Period Weeks    Status New    Target Date 09/24/20      PT LONG TERM GOAL #3   Title Pt will improve L shoulder flexion AROM to 140 degrees  or more to promote ability to raise her arm and reach more comfortably.    Baseline 112 degrees L shoulder flexion AROM with pain (07/29/2020)    Time 8    Period Weeks    Status New    Target Date 09/24/20      PT LONG TERM GOAL #4   Title Pt will improve L shoulder flexion, abduction, ER, IR strength by at least 1/2 MMT grade to promote ability so use her L UE for functional tasks more comfortably.    Baseline 4/5 L shoulder strength all planes (07/29/2020)    Time 8    Period Weeks    Status New    Target Date 09/24/20      PT LONG TERM GOAL #5   Title Pt will improve L shoulder FOTO score by at least 10 points as a demonstration of improved function.    Baseline L shoulder FOTO 53 (07/29/2020)    Time 8    Period Weeks    Status New    Target Date 09/24/20                   Plan - 08/24/20 1515     Clinical Impression Statement Worked on promoting posterior glide of L humeral head via muscle activation as well as scapular strengthening and decreasing upper trap and cervical paraspinal muscle tension to promote  better mechanics at her L shoulder. Decreased pain reported by pt after session. Pt will benefit from continued skilled physical therapy services to decrease pain, improve strength and function.    Personal Factors and Comorbidities Comorbidity 3+;Time since onset of injury/illness/exacerbation;Past/Current Experience    Comorbidities DDD, depression, fibromyalgia, anxiety, COPD, DDD, hx of breast CA S/P surgery, neck surgery    Examination-Activity Limitations [place]    Stability/Clinical Decision Making Stable/Uncomplicated    Rehab Potential Fair    Clinical Impairments Affecting Rehab Potential multiple co-morbidities    PT Frequency 2x / week    PT Duration 8 weeks    PT Treatment/Interventions Therapeutic activities;Therapeutic exercise;Neuromuscular re-education;Patient/family education;Manual techniques;Dry needling;Electrical Stimulation;Iontophoresis '4mg'$ /ml  Dexamethasone    PT Next Visit Plan Posture, scapular and shoulder strength, ROM, manual techniques, modalities PRN    PT Home Exercise Plan AROM supination/pronation in painfree range; L digit composite flexion and extension; Ball squeeze     Consulted and Agree with Plan of Care Patient             Patient will benefit from skilled therapeutic intervention in order to improve the following deficits and impairments:  Pain, Improper body mechanics, Decreased strength  Visit Diagnosis: Acute pain of left shoulder  Cervicalgia     Problem List Patient Active Problem List   Diagnosis Date Noted   Genetic testing 03/09/2017   Rotator cuff tear 02/07/2017   Shoulder pain 02/07/2017   Malignant neoplasm of upper-outer quadrant of left breast in female, estrogen receptor positive ([place]) 02/07/2017   History of hysterectomy 03/16/2016   Rotator cuff (capsule) sprain 10/15/2015   S/P rotator cuff repair 10/14/2015   Atypical squamous cells of undetermined significance on cytologic smear of cervix (ASC-US) 05/13/2015   DDD (degenerative disc disease), cervical 07/21/2014   Status post cervical spinal fusion 07/21/2014   DDD (degenerative disc disease), thoracic 07/21/2014   DDD (degenerative disc disease), lumbar 07/21/2014   Facet syndrome, lumbar 07/21/2014   Sacroiliac joint dysfunction 07/21/2014   Occipital neuralgia 07/21/2014   DJD of shoulder 07/21/2014   Intractable migraine with aura without status migrainosus 02/27/2014   Bipolar disorder ([place]) 02/17/2014   CAFL (chronic airflow limitation) ([place]) 02/17/2014   Fibrositis 02/17/2014   Cephalalgia 02/17/2014   HLD (hyperlipidemia) 02/17/2014   Impaired renal function 02/17/2014   Restless leg 02/17/2014   Apnea, sleep 02/17/2014   Chronic pain associated with significant psychosocial dysfunction 11/28/2013    Joneen Boers PT, DPT   08/24/2020, 6:19 PM  [facility] [address], [place], [address] Phone: [phone]   Fax:  [phone]  Name: Briana [name] MRN: [id number] Date of Birth: Mar 18, 1965

## 2020-08-26 ENCOUNTER — Ambulatory Visit: Payer: Medicare Other

## 2020-08-26 DIAGNOSIS — M25512 Pain in left shoulder: Secondary | ICD-10-CM | POA: Diagnosis not present

## 2020-08-26 DIAGNOSIS — M542 Cervicalgia: Secondary | ICD-10-CM | POA: Diagnosis not present

## 2020-08-26 NOTE — Therapy (Signed)
[place] [address], [place], [address] Phone: [phone]   Fax:  [phone]  Physical Therapy Treatment  Patient Details  Name: Briana [name] MRN: [id number] Date of Birth: [birth year] Referring Provider (PT): Gayland Curry, [place]   Encounter Date: 08/26/2020   PT End of Session - 08/26/20 1418     Visit Number 8    Number of Visits 17    Date for PT Re-Evaluation 09/24/20    Authorization Type UHC Medicare: [place]    Authorization Time Period Cert [id number]    PT Start Time 1419    PT Stop Time 1459    PT Time Calculation (min) 40 min    Activity Tolerance Patient tolerated treatment well;No increased pain    Behavior During Therapy [facility] for tasks assessed/performed             Past Medical History:  Diagnosis Date   Abnormal Pap smear of cervix    01/2015 ascus/neg- 04/2015 ascus/neg   Allergy    Anxiety    Arthritis    Asthma    Breast cancer ([place]) 2019   Chronic kidney disease    COPD (chronic obstructive pulmonary disease) (HCC)    DDD (degenerative disc disease), lumbar    Depression    Elevated lipids    Fibromyalgia    Fibromyalgia    Genetic testing 03/09/2017   Multi-Cancer panel (83 genes) @ [facility] - No pathogenic mutations detected   GERD (gastroesophageal reflux disease)    History of IBS    Hyperthyroidism    Joint disease    Migraine    Migraine    Oxygen deficiency    Personal history of chemotherapy    Personal history of radiation therapy    Restless leg syndrome    Sleep apnea    Does not use C-PAP, cannot tolerate mask    Past Surgical History:  Procedure Laterality Date   ABDOMINAL HYSTERECTOMY  2008   BREAST BIOPSY Left 02/02/2017   [place] core positive   BREAST LUMPECTOMY Left 02/17/2017   BREAST LUMPECTOMY WITH NEEDLE LOCALIZATION Left 02/17/2017   Procedure: BREAST LUMPECTOMY WITH NEEDLE LOCALIZATION;  Surgeon: Vickie Epley, MD;  Location:  [facility];  Service: General;  Laterality: Left;   CARPAL TUNNEL RELEASE Bilateral    cryotherapy     DILATION AND CURETTAGE OF UTERUS     ENDOMETRIAL ABLATION     LAPAROSCOPIC OOPHERECTOMY Left    unsure which side but thinks its the left   LYSIS OF ADHESION Left 10/14/2015   Procedure: LYSIS OF ADHESION;  Surgeon: Thornton Park, MD;  Location: [facility];  Service: Orthopedics;  Laterality: Left;   NECK SURGERY     lower neck fusion rods and screws   OOPHORECTOMY     one ovary removed    PORTA CATH INSERTION N/A 03/08/2017   Procedure: PORTA CATH INSERTION;  Surgeon: Katha Cabal, MD;  Location: [facility];  Service: Cardiovascular;  Laterality: N/A;   RESECTION DISTAL CLAVICAL Left 10/14/2015   Procedure: RESECTION DISTAL CLAVICAL;  Surgeon: Thornton Park, MD;  Location: [facility];  Service: Orthopedics;  Laterality: Left;   SENTINEL NODE BIOPSY Left 02/17/2017   Procedure: SENTINEL NODE BIOPSY;  Surgeon: Vickie Epley, MD;  Location: [facility];  Service: General;  Laterality: Left;   SHOULDER ARTHROSCOPY WITH OPEN ROTATOR CUFF REPAIR Left 10/14/2015   Procedure: SHOULDER ARTHROSCOPY WITH OPEN ROTATOR CUFF REPAIR;  Surgeon: Thornton Park, MD;  Location: [facility];  Service: Orthopedics;  Laterality: Left;   SHOULDER ARTHROSCOPY WITH OPEN ROTATOR CUFF REPAIR AND DISTAL CLAVICLE ACROMINECTOMY Right 09/03/2014   Procedure: RIGHT SHOULDER ARTHROSCOPY WITH MINI OPEN ROTATOR CUFF TEAR;  Surgeon: Thornton Park, MD;  Location: [facility];  Service: Orthopedics;  Laterality: Right;  biceps tenodesis, arthroscopic subacromial decompression and distal clavicle incision   spinal injections     SUBACROMIAL DECOMPRESSION Left 10/14/2015   Procedure: SUBACROMIAL DECOMPRESSION;  Surgeon: Thornton Park, MD;  Location: [facility];  Service: Orthopedics;  Laterality: Left;    There were no vitals filed for this visit.   Subjective Assessment - 08/26/20 1421     Subjective L shoulder is  about a 3/10 today.    Pertinent History L shoulder pain. Pain starts from the L side of her neck which moves to her L shoulder. Pt also states having R mid low back pain. Also states having degenerative joint and disc disease. Goes to pain management. L shoulder pain began about 3 weeks ago, sudden onset, unknown mechanism of injury. Feels tight L posteror shoulder. Felt ok about 3 days ago but a day or two later, pain returned. Pt is mainly R hand dominant. Does not understand what is going on. L shoulder was worse after the surgery and does not understand it.    Currently in Pain? Yes    Pain Score 3                                        PT Education - 08/26/20 1431     Education provided Yes    Education Details ther-ex    [employer]) Educated Patient    Methods Explanation;Demonstration;Tactile cues;Verbal cues    Comprehension Returned demonstration;Verbalized understanding             Objective   Latex allergies   Nothing tight for L arm (such as blood pressure cuff) secondary breast CA and surgery.    Hx of B rotator cuff surgery and disc repair in neck.    [employer]   Muscle tension L upper trap   [employer] Access Code [email or web address]   Manual therapy Seated STM L distal pectoralis muscle close to clavicle to decrease tension   Seated STM L upper trap muscle to decrease tension and fascial restrictions  Seated STM L cervical paraspinal muscles to decrease tension   Seated STM anterior lateral shoulder and proximal arm to decrease fascial restrictions  Improved ability to perform shoulder flexion as well as reach behind her head more comfortably afterwards    Therapeutic exercise    Scapular retraction, emphasis on proper movement with manual resistance targeting the lower trap muscle             L 10x5 seconds for 3 sets             R 10x5 seconds for 3 sets             L shoulder flexion with scapular retraction 10x   Improved  exercise technique, movement at target joints, use of target muscles after mod verbal, visual, tactile cues.      Response to treatment Pt tolerated session well without aggravation of symptoms. No L shoulder pain reported after session.      Clinical impression Decreased L shoulder stiffness with treatment to decrease fascial restrictions L upper trap, lower trap, and  anterior lateral shoulder as well as decreasing upper trap and distal pectoralis muscle tension. Also worked on lower trap strengthening to promote better glenohumeral mechanics. Pt will benefit from continued skilled physical therapy services to decrease pain, improve strength and function.        PT Short Term Goals - 07/29/20 1812       PT SHORT TERM GOAL #1   Title Pt will be independent with her initial HEP to decrease pain, improve strength, ROM, function, and ability to reach more comfortably.    Time 3    Period Weeks    Status New    Target Date 08/20/20               PT Long Term Goals - 07/29/20 1819       PT LONG TERM GOAL #1   Title Pt will have a decrease in L shoulder joint pain to 3/10 or less at worst to promote ability to raise her arm, reach, and carry items more comfortably.    Baseline 8/10 L shoulder pain at most for the past 2 weeks (07/29/2020)    Time 8    Period Weeks    Status New    Target Date 09/24/20      PT LONG TERM GOAL #2   Title Pt will have a decrease in L lateral neck pain to 3/10 or less at worst to promote ability to raise her arm, reach, and carry items more comfortably.    Baseline 8/10 L lateral neck pain at most for the past 2 weeks (07/29/2020)    Time 8    Period Weeks    Status New    Target Date 09/24/20      PT LONG TERM GOAL #3   Title Pt will improve L shoulder flexion AROM to 140 degrees or more to promote ability to raise her arm and reach more comfortably.    Baseline 112 degrees L shoulder flexion AROM with pain (07/29/2020)    Time 8    Period  Weeks    Status New    Target Date 09/24/20      PT LONG TERM GOAL #4   Title Pt will improve L shoulder flexion, abduction, ER, IR strength by at least 1/2 MMT grade to promote ability so use her L UE for functional tasks more comfortably.    Baseline 4/5 L shoulder strength all planes (07/29/2020)    Time 8    Period Weeks    Status New    Target Date 09/24/20      PT LONG TERM GOAL #5   Title Pt will improve L shoulder FOTO score by at least 10 points as a demonstration of improved function.    Baseline L shoulder FOTO 53 (07/29/2020)    Time 8    Period Weeks    Status New    Target Date 09/24/20                   Plan - 08/26/20 1418     Clinical Impression Statement Decreased L shoulder stiffness with treatment to decrease fascial restrictions L upper trap, lower trap, and anterior lateral shoulder as well as decreasing upper trap and distal pectoralis muscle tension. Also worked on lower trap strengthening to promote better glenohumeral mechanics. Pt will benefit from continued skilled physical therapy services to decrease pain, improve strength and function.    Personal Factors and Comorbidities Comorbidity 3+;Time since onset of injury/illness/exacerbation;Past/Current Experience  Comorbidities DDD, depression, fibromyalgia, anxiety, COPD, DDD, hx of breast CA S/P surgery, neck surgery    Examination-Activity Limitations [place]    Stability/Clinical Decision Making Stable/Uncomplicated    Clinical Decision Making Low    Rehab Potential Fair    Clinical Impairments Affecting Rehab Potential multiple co-morbidities    PT Frequency 2x / week    PT Duration 8 weeks    PT Treatment/Interventions Therapeutic activities;Therapeutic exercise;Neuromuscular re-education;Patient/family education;Manual techniques;Dry needling;Electrical Stimulation;Iontophoresis '4mg'$ /ml Dexamethasone    PT Next Visit Plan Posture, scapular and shoulder strength, ROM, manual techniques,  modalities PRN    PT Home Exercise Plan AROM supination/pronation in painfree range; L digit composite flexion and extension; Ball squeeze     Consulted and Agree with Plan of Care Patient             Patient will benefit from skilled therapeutic intervention in order to improve the following deficits and impairments:  Pain, Improper body mechanics, Decreased strength  Visit Diagnosis: Acute pain of left shoulder  Cervicalgia     Problem List Patient Active Problem List   Diagnosis Date Noted   Genetic testing 03/09/2017   Rotator cuff tear 02/07/2017   Shoulder pain 02/07/2017   Malignant neoplasm of upper-outer quadrant of left breast in female, estrogen receptor positive ([place]) 02/07/2017   History of hysterectomy 03/16/2016   Rotator cuff (capsule) sprain 10/15/2015   S/P rotator cuff repair 10/14/2015   Atypical squamous cells of undetermined significance on cytologic smear of cervix (ASC-US) 05/13/2015   DDD (degenerative disc disease), cervical 07/21/2014   Status post cervical spinal fusion 07/21/2014   DDD (degenerative disc disease), thoracic 07/21/2014   DDD (degenerative disc disease), lumbar 07/21/2014   Facet syndrome, lumbar 07/21/2014   Sacroiliac joint dysfunction 07/21/2014   Occipital neuralgia 07/21/2014   DJD of shoulder 07/21/2014   Intractable migraine with aura without status migrainosus 02/27/2014   Bipolar disorder ([place]) 02/17/2014   CAFL (chronic airflow limitation) ([place]) 02/17/2014   Fibrositis 02/17/2014   Cephalalgia 02/17/2014   HLD (hyperlipidemia) 02/17/2014   Impaired renal function 02/17/2014   Restless leg 02/17/2014   Apnea, sleep 02/17/2014   Chronic pain associated with significant psychosocial dysfunction 11/28/2013    Joneen Boers PT, DPT   08/26/2020, 3:19 PM  [place] [address], [place], [address] Phone: [phone]   Fax:   [phone]  Name: Briana [name] MRN: [id number] Date of Birth: Aug 19, 1965

## 2020-08-31 ENCOUNTER — Ambulatory Visit: Payer: Medicare Other | Attending: Neurology

## 2020-08-31 DIAGNOSIS — M542 Cervicalgia: Secondary | ICD-10-CM | POA: Insufficient documentation

## 2020-08-31 DIAGNOSIS — M25512 Pain in left shoulder: Secondary | ICD-10-CM | POA: Insufficient documentation

## 2020-08-31 NOTE — Therapy (Signed)
[facility] [address] [place], [address] Phone: [phone]   Fax:  [phone]  Physical Therapy Treatment  Patient Details  Name: Briana [name] MRN: [id number] Date of Birth: [birth year] Referring Provider (PT): Gayland Curry, [place]   Encounter Date: 08/31/2020   PT End of Session - 08/31/20 1325     Visit Number 9    Number of Visits 17    Date for PT Re-Evaluation 09/24/20    Authorization Type UHC Medicare: [place]    Authorization Time Period Cert [id number]    PT Start Time 1325    PT Stop Time 1416    PT Time Calculation (min) 51 min    Activity Tolerance Patient tolerated treatment well;No increased pain    Behavior During Therapy [facility] for tasks assessed/performed             Past Medical History:  Diagnosis Date   Abnormal Pap smear of cervix    01/2015 ascus/neg- 04/2015 ascus/neg   Allergy    Anxiety    Arthritis    Asthma    Breast cancer ([place]) 2019   Chronic kidney disease    COPD (chronic obstructive pulmonary disease) (HCC)    DDD (degenerative disc disease), lumbar    Depression    Elevated lipids    Fibromyalgia    Fibromyalgia    Genetic testing 03/09/2017   Multi-Cancer panel (83 genes) @ [facility] - No pathogenic mutations detected   GERD (gastroesophageal reflux disease)    History of IBS    Hyperthyroidism    Joint disease    Migraine    Migraine    Oxygen deficiency    Personal history of chemotherapy    Personal history of radiation therapy    Restless leg syndrome    Sleep apnea    Does not use C-PAP, cannot tolerate mask    Past Surgical History:  Procedure Laterality Date   ABDOMINAL HYSTERECTOMY  2008   BREAST BIOPSY Left 02/02/2017   [place] core positive   BREAST LUMPECTOMY Left 02/17/2017   BREAST LUMPECTOMY WITH NEEDLE LOCALIZATION Left 02/17/2017   Procedure: BREAST LUMPECTOMY WITH NEEDLE LOCALIZATION;  Surgeon: Vickie Epley, MD;  Location:  [facility];  Service: General;  Laterality: Left;   CARPAL TUNNEL RELEASE Bilateral    cryotherapy     DILATION AND CURETTAGE OF UTERUS     ENDOMETRIAL ABLATION     LAPAROSCOPIC OOPHERECTOMY Left    unsure which side but thinks its the left   LYSIS OF ADHESION Left 10/14/2015   Procedure: LYSIS OF ADHESION;  Surgeon: Thornton Park, MD;  Location: [facility];  Service: Orthopedics;  Laterality: Left;   NECK SURGERY     lower neck fusion rods and screws   OOPHORECTOMY     one ovary removed    PORTA CATH INSERTION N/A 03/08/2017   Procedure: PORTA CATH INSERTION;  Surgeon: Katha Cabal, MD;  Location: [place] [facility];  Service: Cardiovascular;  Laterality: N/A;   RESECTION DISTAL CLAVICAL Left 10/14/2015   Procedure: RESECTION DISTAL CLAVICAL;  Surgeon: Thornton Park, MD;  Location: [facility];  Service: Orthopedics;  Laterality: Left;   SENTINEL NODE BIOPSY Left 02/17/2017   Procedure: SENTINEL NODE BIOPSY;  Surgeon: Vickie Epley, MD;  Location: [facility];  Service: General;  Laterality: Left;   SHOULDER ARTHROSCOPY WITH OPEN ROTATOR CUFF REPAIR Left 10/14/2015   Procedure: SHOULDER ARTHROSCOPY WITH OPEN ROTATOR CUFF REPAIR;  Surgeon: Thornton Park, MD;  Location: [facility];  Service: Orthopedics;  Laterality: Left;   SHOULDER ARTHROSCOPY WITH OPEN ROTATOR CUFF REPAIR AND DISTAL CLAVICLE ACROMINECTOMY Right 09/03/2014   Procedure: RIGHT SHOULDER ARTHROSCOPY WITH MINI OPEN ROTATOR CUFF TEAR;  Surgeon: Thornton Park, MD;  Location: [facility];  Service: Orthopedics;  Laterality: Right;  biceps tenodesis, arthroscopic subacromial decompression and distal clavicle incision   spinal injections     SUBACROMIAL DECOMPRESSION Left 10/14/2015   Procedure: SUBACROMIAL DECOMPRESSION;  Surgeon: Thornton Park, MD;  Location: [facility];  Service: Orthopedics;  Laterality: Left;    There were no vitals filed for this visit.   Subjective Assessment - 08/31/20 1328     Subjective L shoulder is a  4/10 currently. Its gonna rain. No L neck pain currently. 4/10 neck pain at most for the past 7 days. 6/10 L shoulder joint pain at most for the past 7 days. Feels better with physical therapy, she has improved a lot.    Pertinent History L shoulder pain. Pain starts from the L side of her neck which moves to her L shoulder. Pt also states having R mid low back pain. Also states having degenerative joint and disc disease. Goes to pain management. L shoulder pain began about 3 weeks ago, sudden onset, unknown mechanism of injury. Feels tight L posteror shoulder. Felt ok about 3 days ago but a day or two later, pain returned. Pt is mainly R hand dominant. Does not understand what is going on. L shoulder was worse after the surgery and does not understand it.    Currently in Pain? Yes    Pain Score 4                                        PT Education - 08/31/20 1334     Education provided Yes    Education Details ther-ex    [employer]) Educated Patient    Methods Explanation;Demonstration;Tactile cues;Verbal cues    Comprehension Returned demonstration;Verbalized understanding             Objective   Latex allergies   Nothing tight for L arm (such as blood pressure cuff) secondary breast CA and surgery.    Hx of B rotator cuff surgery and disc repair in neck.    [employer]   Muscle tension L upper trap   [employer] Access Code [email or web address]   Manual therapy  STM L lower trap muscle area to decrease fascial restrictions  Seated STM L upper trap muscle to decrease tension and fascial restrictions  Seated STM L cervical paraspinal muscles to decrease tension and fascial restrictions     Therapeutic exercise  L levator scapula stretch 30 seconds x 5  Seated L shoulder self inferior capsule stretch 10x5 seconds for 3 sets  L cross arm stretch 10 seconds x 10  L upper trap stretch 30 seconds x 5  Decreased L shoulder pain to 2/10 after aforementioned  exercises.   Scapular retraction green band 10x5 seconds   L shoulder flexion AROM after session   Flexion 135 degrees, abduction 142 degrees   Manually resisted L shoulder flexion, abduction, ER, IR  Flexion 4+/5, abduction 4+/5, ER 4+/5, IR 4+/5      Improved exercise technique, movement at target joints, use of target muscles after min to mod verbal, visual, tactile cues.        Response to treatment  Pt tolerated session well without aggravation of symptoms. No L shoulder pain reported after session.      Clinical impression Continued working on decreasing soft tissue restrictions around the L upper trap, levator scapula, cervical paraspinal and lower trap muscles as well as improving inferior capsular mobility to promote better glenohumeral mechanics and decrease pain with raising her arm. Pt will benefit from continued skilled physical therapy services to decrease pain, improve strength and function.           PT Short Term Goals - 07/29/20 1812       PT SHORT TERM GOAL #1   Title Pt will be independent with her initial HEP to decrease pain, improve strength, ROM, function, and ability to reach more comfortably.    Time 3    Period Weeks    Status New    Target Date 08/20/20               PT Long Term Goals - 07/29/20 1819       PT LONG TERM GOAL #1   Title Pt will have a decrease in L shoulder joint pain to 3/10 or less at worst to promote ability to raise her arm, reach, and carry items more comfortably.    Baseline 8/10 L shoulder pain at most for the past 2 weeks (07/29/2020)    Time 8    Period Weeks    Status New    Target Date 09/24/20      PT LONG TERM GOAL #2   Title Pt will have a decrease in L lateral neck pain to 3/10 or less at worst to promote ability to raise her arm, reach, and carry items more comfortably.    Baseline 8/10 L lateral neck pain at most for the past 2 weeks (07/29/2020)    Time 8    Period Weeks    Status New    Target  Date 09/24/20      PT LONG TERM GOAL #3   Title Pt will improve L shoulder flexion AROM to 140 degrees or more to promote ability to raise her arm and reach more comfortably.    Baseline 112 degrees L shoulder flexion AROM with pain (07/29/2020)    Time 8    Period Weeks    Status New    Target Date 09/24/20      PT LONG TERM GOAL #4   Title Pt will improve L shoulder flexion, abduction, ER, IR strength by at least 1/2 MMT grade to promote ability so use her L UE for functional tasks more comfortably.    Baseline 4/5 L shoulder strength all planes (07/29/2020)    Time 8    Period Weeks    Status New    Target Date 09/24/20      PT LONG TERM GOAL #5   Title Pt will improve L shoulder FOTO score by at least 10 points as a demonstration of improved function.    Baseline L shoulder FOTO 53 (07/29/2020)    Time 8    Period Weeks    Status New    Target Date 09/24/20                   Plan - 08/31/20 1315     Clinical Impression Statement Continued working on decreasing soft tissue restrictions around the L upper trap, levator scapula, cervical paraspinal and lower trap muscles as well as improving inferior capsular mobility to promote better glenohumeral mechanics and decrease  pain with raising her arm. Pt will benefit from continued skilled physical therapy services to decrease pain, improve strength and function.    Personal Factors and Comorbidities Comorbidity 3+;Time since onset of injury/illness/exacerbation;Past/Current Experience    Comorbidities DDD, depression, fibromyalgia, anxiety, COPD, DDD, hx of breast CA S/P surgery, neck surgery    Examination-Activity Limitations [place]    Stability/Clinical Decision Making Stable/Uncomplicated    Rehab Potential Fair    Clinical Impairments Affecting Rehab Potential multiple co-morbidities    PT Frequency 2x / week    PT Duration 8 weeks    PT Treatment/Interventions Therapeutic activities;Therapeutic exercise;Neuromuscular  re-education;Patient/family education;Manual techniques;Dry needling;Electrical Stimulation;Iontophoresis '4mg'$ /ml Dexamethasone    PT Next Visit Plan Posture, scapular and shoulder strength, ROM, manual techniques, modalities PRN    PT Home Exercise Plan AROM supination/pronation in painfree range; L digit composite flexion and extension; Ball squeeze     Consulted and Agree with Plan of Care Patient             Patient will benefit from skilled therapeutic intervention in order to improve the following deficits and impairments:  Pain, Improper body mechanics, Decreased strength  Visit Diagnosis: Acute pain of left shoulder  Cervicalgia     Problem List Patient Active Problem List   Diagnosis Date Noted   Genetic testing 03/09/2017   Rotator cuff tear 02/07/2017   Shoulder pain 02/07/2017   Malignant neoplasm of upper-outer quadrant of left breast in female, estrogen receptor positive ([place]) 02/07/2017   History of hysterectomy 03/16/2016   Rotator cuff (capsule) sprain 10/15/2015   S/P rotator cuff repair 10/14/2015   Atypical squamous cells of undetermined significance on cytologic smear of cervix (ASC-US) 05/13/2015   DDD (degenerative disc disease), cervical 07/21/2014   Status post cervical spinal fusion 07/21/2014   DDD (degenerative disc disease), thoracic 07/21/2014   DDD (degenerative disc disease), lumbar 07/21/2014   Facet syndrome, lumbar 07/21/2014   Sacroiliac joint dysfunction 07/21/2014   Occipital neuralgia 07/21/2014   DJD of shoulder 07/21/2014   Intractable migraine with aura without status migrainosus 02/27/2014   Bipolar disorder ([place]) 02/17/2014   CAFL (chronic airflow limitation) ([place]) 02/17/2014   Fibrositis 02/17/2014   Cephalalgia 02/17/2014   HLD (hyperlipidemia) 02/17/2014   Impaired renal function 02/17/2014   Restless leg 02/17/2014   Apnea, sleep 02/17/2014   Chronic pain associated with significant psychosocial dysfunction 11/28/2013      Joneen Boers PT, DPT   08/31/2020, 2:29 PM  [place] [facility] [address] [place], [address] Phone: [phone]   Fax:  [phone]  Name: Briana [name] MRN: [id number] Date of Birth: 12-Nov-1965

## 2020-09-01 DIAGNOSIS — J449 Chronic obstructive pulmonary disease, unspecified: Secondary | ICD-10-CM | POA: Diagnosis not present

## 2020-09-02 ENCOUNTER — Ambulatory Visit: Payer: Medicare Other

## 2020-09-02 NOTE — Therapy (Signed)
[place] [facility] [address] [place], [place], [address] Phone: [phone]   Fax:  [phone]  Physical Therapy Treatment  Patient Details  Name: Briana [name] MRN: [id number] Date of Birth: 05-27-1965 Referring Provider (PT): Harlin Rain, [place]   Encounter Date: 09/02/2020    Past Medical History:  Diagnosis Date   Abnormal Pap smear of cervix    01/2015 ascus/neg- 04/2015 ascus/neg   Allergy    Anxiety    Arthritis    Asthma    Breast cancer (HCC) 2019   Chronic kidney disease    COPD (chronic obstructive pulmonary disease) (HCC)    DDD (degenerative disc disease), lumbar    Depression    Elevated lipids    Fibromyalgia    Fibromyalgia    Genetic testing 03/09/2017   Multi-Cancer panel (83 genes) @ [facility] - No pathogenic mutations detected   GERD (gastroesophageal reflux disease)    History of IBS    Hyperthyroidism    Joint disease    Migraine    Migraine    Oxygen deficiency    Personal history of chemotherapy    Personal history of radiation therapy    Restless leg syndrome    Sleep apnea    Does not use C-PAP, cannot tolerate mask    Past Surgical History:  Procedure Laterality Date   ABDOMINAL HYSTERECTOMY  2008   BREAST BIOPSY Left 02/02/2017   [place] core positive   BREAST LUMPECTOMY Left 02/17/2017   BREAST LUMPECTOMY WITH NEEDLE LOCALIZATION Left 02/17/2017   Procedure: BREAST LUMPECTOMY WITH NEEDLE LOCALIZATION;  Surgeon: Ancil Linsey, MD;  Location: [facility];  Service: General;  Laterality: Left;   CARPAL TUNNEL RELEASE Bilateral    cryotherapy     DILATION AND CURETTAGE OF UTERUS     ENDOMETRIAL ABLATION     LAPAROSCOPIC OOPHERECTOMY Left    unsure which side but thinks its the left   LYSIS OF ADHESION Left 10/14/2015   Procedure: LYSIS OF ADHESION;  Surgeon: Juanell Fairly, MD;  Location: [facility];  Service: Orthopedics;  Laterality: Left;   NECK SURGERY     lower neck fusion rods  and screws   OOPHORECTOMY     one ovary removed    PORTA CATH INSERTION N/A 03/08/2017   Procedure: PORTA CATH INSERTION;  Surgeon: Renford Dills, MD;  Location: [facility];  Service: Cardiovascular;  Laterality: N/A;   RESECTION DISTAL CLAVICAL Left 10/14/2015   Procedure: RESECTION DISTAL CLAVICAL;  Surgeon: Juanell Fairly, MD;  Location: [facility];  Service: Orthopedics;  Laterality: Left;   SENTINEL NODE BIOPSY Left 02/17/2017   Procedure: SENTINEL NODE BIOPSY;  Surgeon: Ancil Linsey, MD;  Location: [facility];  Service: General;  Laterality: Left;   SHOULDER ARTHROSCOPY WITH OPEN ROTATOR CUFF REPAIR Left 10/14/2015   Procedure: SHOULDER ARTHROSCOPY WITH OPEN ROTATOR CUFF REPAIR;  Surgeon: Juanell Fairly, MD;  Location: [facility];  Service: Orthopedics;  Laterality: Left;   SHOULDER ARTHROSCOPY WITH OPEN ROTATOR CUFF REPAIR AND DISTAL CLAVICLE ACROMINECTOMY Right 09/03/2014   Procedure: RIGHT SHOULDER ARTHROSCOPY WITH MINI OPEN ROTATOR CUFF TEAR;  Surgeon: Juanell Fairly, MD;  Location: [facility];  Service: Orthopedics;  Laterality: Right;  biceps tenodesis, arthroscopic subacromial decompression and distal clavicle incision   spinal injections     SUBACROMIAL DECOMPRESSION Left 10/14/2015   Procedure: SUBACROMIAL DECOMPRESSION;  Surgeon: Juanell Fairly, MD;  Location: [facility];  Service: Orthopedics;  Laterality: Left;  There were no vitals filed for this visit.                             Unable to perform PT today secondary to pt having to take care of an urgent matter on the phone.       PT Short Term Goals - 07/29/20 1812       PT SHORT TERM GOAL #1   Title Pt will be independent with her initial HEP to decrease pain, improve strength, ROM, function, and ability to reach more comfortably.    Time 3    Period Weeks    Status New    Target Date 08/20/20               PT Long Term Goals - 07/29/20 1819       PT LONG TERM GOAL  #1   Title Pt will have a decrease in L shoulder joint pain to 3/10 or less at worst to promote ability to raise her arm, reach, and carry items more comfortably.    Baseline 8/10 L shoulder pain at most for the past 2 weeks (07/29/2020)    Time 8    Period Weeks    Status New    Target Date 09/24/20      PT LONG TERM GOAL #2   Title Pt will have a decrease in L lateral neck pain to 3/10 or less at worst to promote ability to raise her arm, reach, and carry items more comfortably.    Baseline 8/10 L lateral neck pain at most for the past 2 weeks (07/29/2020)    Time 8    Period Weeks    Status New    Target Date 09/24/20      PT LONG TERM GOAL #3   Title Pt will improve L shoulder flexion AROM to 140 degrees or more to promote ability to raise her arm and reach more comfortably.    Baseline 112 degrees L shoulder flexion AROM with pain (07/29/2020)    Time 8    Period Weeks    Status New    Target Date 09/24/20      PT LONG TERM GOAL #4   Title Pt will improve L shoulder flexion, abduction, ER, IR strength by at least 1/2 MMT grade to promote ability so use her L UE for functional tasks more comfortably.    Baseline 4/5 L shoulder strength all planes (07/29/2020)    Time 8    Period Weeks    Status New    Target Date 09/24/20      PT LONG TERM GOAL #5   Title Pt will improve L shoulder FOTO score by at least 10 points as a demonstration of improved function.    Baseline L shoulder FOTO 53 (07/29/2020)    Time 8    Period Weeks    Status New    Target Date 09/24/20                    Patient will benefit from skilled therapeutic intervention in order to improve the following deficits and impairments:     Visit Diagnosis: No diagnosis found.     Problem List Patient Active Problem List   Diagnosis Date Noted   Genetic testing 03/09/2017   Rotator cuff tear 02/07/2017   Shoulder pain 02/07/2017   Malignant neoplasm of upper-outer quadrant of left breast in  female, estrogen  receptor positive (HCC) 02/07/2017   History of hysterectomy 03/16/2016   Rotator cuff (capsule) sprain 10/15/2015   S/P rotator cuff repair 10/14/2015   Atypical squamous cells of undetermined significance on cytologic smear of cervix (ASC-US) 05/13/2015   DDD (degenerative disc disease), cervical 07/21/2014   Status post cervical spinal fusion 07/21/2014   DDD (degenerative disc disease), thoracic 07/21/2014   DDD (degenerative disc disease), lumbar 07/21/2014   Facet syndrome, lumbar 07/21/2014   Sacroiliac joint dysfunction 07/21/2014   Occipital neuralgia 07/21/2014   DJD of shoulder 07/21/2014   Intractable migraine with aura without status migrainosus 02/27/2014   Bipolar disorder (HCC) 02/17/2014   CAFL (chronic airflow limitation) (HCC) 02/17/2014   Fibrositis 02/17/2014   Cephalalgia 02/17/2014   HLD (hyperlipidemia) 02/17/2014   Impaired renal function 02/17/2014   Restless leg 02/17/2014   Apnea, sleep 02/17/2014   Chronic pain associated with significant psychosocial dysfunction 11/28/2013    Briana [name] 09/02/2020, 1:27 PM  [facility] [address] [place], [address] Phone: [phone]   Fax:  [phone]  Name: Briana [name] MRN: [id number] Date of Birth: 12/18/1965

## 2020-09-07 ENCOUNTER — Ambulatory Visit: Payer: Medicare Other

## 2020-09-07 DIAGNOSIS — M542 Cervicalgia: Secondary | ICD-10-CM

## 2020-09-07 DIAGNOSIS — M25512 Pain in left shoulder: Secondary | ICD-10-CM

## 2020-09-07 NOTE — Therapy (Signed)
[facility] [address] [place], [address] Phone: [phone]   Fax:  [phone]  Physical Therapy Treatment And Progress Report [phone] - 09/07/2020)  Patient Details  Name: Briana [name] MRN: [id number] Date of Birth: May 22, 1965 Referring Provider (PT): Gayland Curry, [place]   Encounter Date: 09/07/2020   PT End of Session - 09/07/20 1332     Visit Number 10    Number of Visits 17    Date for PT Re-Evaluation 09/24/20    Authorization Type UHC Medicare: [place]    Authorization Time Period Cert 3/53/61-4/43/15    PT Start Time 1332    PT Stop Time 1413    PT Time Calculation (min) 41 min    Activity Tolerance Patient tolerated treatment well;No increased pain    Behavior During Therapy [facility] for tasks assessed/performed             Past Medical History:  Diagnosis Date   Abnormal Pap smear of cervix    01/2015 ascus/neg- 04/2015 ascus/neg   Allergy    Anxiety    Arthritis    Asthma    Breast cancer ([place]) 2019   Chronic kidney disease    COPD (chronic obstructive pulmonary disease) (HCC)    DDD (degenerative disc disease), lumbar    Depression    Elevated lipids    Fibromyalgia    Fibromyalgia    Genetic testing 03/09/2017   Multi-Cancer panel (83 genes) @ [facility] - No pathogenic mutations detected   GERD (gastroesophageal reflux disease)    History of IBS    Hyperthyroidism    Joint disease    Migraine    Migraine    Oxygen deficiency    Personal history of chemotherapy    Personal history of radiation therapy    Restless leg syndrome    Sleep apnea    Does not use C-PAP, cannot tolerate mask    Past Surgical History:  Procedure Laterality Date   ABDOMINAL HYSTERECTOMY  2008   BREAST BIOPSY Left 02/02/2017   [place] core positive   BREAST LUMPECTOMY Left 02/17/2017   BREAST LUMPECTOMY WITH NEEDLE LOCALIZATION Left 02/17/2017   Procedure: BREAST LUMPECTOMY WITH NEEDLE LOCALIZATION;   Surgeon: Vickie Epley, MD;  Location: [facility];  Service: General;  Laterality: Left;   CARPAL TUNNEL RELEASE Bilateral    cryotherapy     DILATION AND CURETTAGE OF UTERUS     ENDOMETRIAL ABLATION     LAPAROSCOPIC OOPHERECTOMY Left    unsure which side but thinks its the left   LYSIS OF ADHESION Left 10/14/2015   Procedure: LYSIS OF ADHESION;  Surgeon: Thornton Park, MD;  Location: [facility];  Service: Orthopedics;  Laterality: Left;   NECK SURGERY     lower neck fusion rods and screws   OOPHORECTOMY     one ovary removed    PORTA CATH INSERTION N/A 03/08/2017   Procedure: PORTA CATH INSERTION;  Surgeon: Katha Cabal, MD;  Location: [facility];  Service: Cardiovascular;  Laterality: N/A;   RESECTION DISTAL CLAVICAL Left 10/14/2015   Procedure: RESECTION DISTAL CLAVICAL;  Surgeon: Thornton Park, MD;  Location: [facility];  Service: Orthopedics;  Laterality: Left;   SENTINEL NODE BIOPSY Left 02/17/2017   Procedure: SENTINEL NODE BIOPSY;  Surgeon: Vickie Epley, MD;  Location: [facility];  Service: General;  Laterality: Left;   SHOULDER ARTHROSCOPY WITH OPEN ROTATOR CUFF REPAIR Left 10/14/2015   Procedure: SHOULDER  ARTHROSCOPY WITH OPEN ROTATOR CUFF REPAIR;  Surgeon: Thornton Park, MD;  Location: [facility];  Service: Orthopedics;  Laterality: Left;   SHOULDER ARTHROSCOPY WITH OPEN ROTATOR CUFF REPAIR AND DISTAL CLAVICLE ACROMINECTOMY Right 09/03/2014   Procedure: RIGHT SHOULDER ARTHROSCOPY WITH MINI OPEN ROTATOR CUFF TEAR;  Surgeon: Thornton Park, MD;  Location: [facility];  Service: Orthopedics;  Laterality: Right;  biceps tenodesis, arthroscopic subacromial decompression and distal clavicle incision   spinal injections     SUBACROMIAL DECOMPRESSION Left 10/14/2015   Procedure: SUBACROMIAL DECOMPRESSION;  Surgeon: Thornton Park, MD;  Location: [facility];  Service: Orthopedics;  Laterality: Left;    There were no vitals filed for this visit.   Subjective Assessment -  09/07/20 1333     Subjective L shoulder is a 2/10 currently. Not in a lot of pain. Planted some plants this morning.    Pertinent History L shoulder pain. Pain starts from the L side of her neck which moves to her L shoulder. Pt also states having R mid low back pain. Also states having degenerative joint and disc disease. Goes to pain management. L shoulder pain began about 3 weeks ago, sudden onset, unknown mechanism of injury. Feels tight L posteror shoulder. Felt ok about 3 days ago but a day or two later, pain returned. Pt is mainly R hand dominant. Does not understand what is going on. L shoulder was worse after the surgery and does not understand it.    Currently in Pain? Yes    Pain Score 2                                        PT Education - 09/07/20 1415     Education provided Yes    Education Details ther-ex    [employer]) Educated Patient    Methods Explanation;Demonstration;Tactile cues;Verbal cues    Comprehension Returned demonstration;Verbalized understanding           Objective   Latex allergies   Nothing tight for L arm (such as blood pressure cuff) secondary breast CA and surgery.    Hx of B rotator cuff surgery and disc repair in neck.    [employer]   Muscle tension L upper trap   [employer] Access Code [email or web address]   Manual therapy   Seated STM L cervical paraspinal muscles to decrease tension and fascial restrictions  STM L lower trap muscle area to decrease fascial restrictions   Seated STM L upper trap muscle to decrease tension and fascial restrictions       Therapeutic exercise   Feels like she has made a lot of progress. Not at 100%. However in a better place. Able to pick up something heavy and did not bother her shoulder.   Reviewed progress/current status with PT with pt.   L shoulder AROM  Flexion 1x  Manually resisted L shoulder      L cross arm stretch 10 seconds x 10  Manually resisted scapular  retraction targeting lower trap   L 10x5 seconds for 3 sets  Seated manually resisted scapular depression isometrics   L 10x5 seconds for 3 sets  Seated manually resisted L elbow extension isometrics 10x3 with 5 second hold s  Standing L shoulder ER yellow band 10x3          Improved exercise technique, movement at target joints, use of target muscles after min to mod verbal, visual, tactile cues.  Response to treatment Pt tolerated session well without aggravation of symptoms. No L shoulder or neck pain reported after session.      Clinical impression Pt demonstrates decreased L shoulder and neck pain, improved strength and ROM since initial evaluation. Pt making very good progress with PT toward goals. Pt will benefit from continued skilled physical therapy services to decrease pain, improve strength and function.        PT Short Term Goals - 09/07/20 1335       PT SHORT TERM GOAL #1   Title Pt will be independent with her initial HEP to decrease pain, improve strength, ROM, function, and ability to reach more comfortably.    Baseline Does her HEP, no other questions (09/07/2020)    Time 3    Period Weeks    Status Achieved    Target Date 08/20/20               PT Long Term Goals - 09/07/20 1337       PT LONG TERM GOAL #1   Title Pt will have a decrease in L shoulder joint pain to 3/10 or less at worst to promote ability to raise her arm, reach, and carry items more comfortably.    Baseline 8/10 L shoulder pain at most for the past 2 weeks (07/29/2020); 3/10 at most for the past 7 days (09/07/2020)    Time 8    Period Weeks    Status Achieved    Target Date 09/24/20      PT LONG TERM GOAL #2   Title Pt will have a decrease in L lateral neck pain to 3/10 or less at worst to promote ability to raise her arm, reach, and carry items more comfortably.    Baseline 8/10 L lateral neck pain at most for the past 2 weeks (07/29/2020); 2/10 at most for the past 7  days (09/07/2020)    Time 8    Period Weeks    Status Achieved    Target Date 09/24/20      PT LONG TERM GOAL #3   Title Pt will improve L shoulder flexion AROM to 140 degrees or more to promote ability to raise her arm and reach more comfortably.    Baseline 112 degrees L shoulder flexion AROM with pain (07/29/2020); 135 degrees L shoulder flexion AROM (09/07/2020)    Time 8    Period Weeks    Status Partially Met    Target Date 09/24/20      PT LONG TERM GOAL #4   Title Pt will improve L shoulder flexion, abduction, ER, IR strength by at least 1/2 MMT grade to promote ability so use her L UE for functional tasks more comfortably.    Baseline 4/5 L shoulder strength all planes (07/29/2020); 4+/5 all planes (09/07/2020)    Time 8    Period Weeks    Status Achieved    Target Date 09/24/20      PT LONG TERM GOAL #5   Title Pt will improve L shoulder FOTO score by at least 10 points as a demonstration of improved function.    Baseline L shoulder FOTO 53 (07/29/2020); 59 (08/31/2020)    Time 8    Period Weeks    Status On-going    Target Date 09/24/20                   Plan - 09/07/20 1413     Clinical Impression Statement Pt demonstrates decreased L  shoulder and neck pain, improved strength and ROM since initial evaluation. Pt making very good progress with PT toward goals. Pt will benefit from continued skilled physical therapy services to decrease pain, improve strength and function.    Personal Factors and Comorbidities Comorbidity 3+;Time since onset of injury/illness/exacerbation;Past/Current Experience    Comorbidities DDD, depression, fibromyalgia, anxiety, COPD, DDD, hx of breast CA S/P surgery, neck surgery    Examination-Activity Limitations [place]    Stability/Clinical Decision Making Stable/Uncomplicated    Rehab Potential Fair    Clinical Impairments Affecting Rehab Potential multiple co-morbidities    PT Frequency 2x / week    PT Duration 8 weeks    PT  Treatment/Interventions Therapeutic activities;Therapeutic exercise;Neuromuscular re-education;Patient/family education;Manual techniques;Dry needling;Electrical Stimulation;Iontophoresis 34m/ml Dexamethasone    PT Next Visit Plan Posture, scapular and shoulder strength, ROM, manual techniques, modalities PRN    PT Home Exercise Plan AROM supination/pronation in painfree range; L digit composite flexion and extension; Ball squeeze     Consulted and Agree with Plan of Care Patient             Patient will benefit from skilled therapeutic intervention in order to improve the following deficits and impairments:  Pain, Improper body mechanics, Decreased strength  Visit Diagnosis: Acute pain of left shoulder  Cervicalgia     Problem List Patient Active Problem List   Diagnosis Date Noted   Genetic testing 03/09/2017   Rotator cuff tear 02/07/2017   Shoulder pain 02/07/2017   Malignant neoplasm of upper-outer quadrant of left breast in female, estrogen receptor positive ([place] 02/07/2017   History of hysterectomy 03/16/2016   Rotator cuff (capsule) sprain 10/15/2015   S/P rotator cuff repair 10/14/2015   Atypical squamous cells of undetermined significance on cytologic smear of cervix (ASC-US) 05/13/2015   DDD (degenerative disc disease), cervical 07/21/2014   Status post cervical spinal fusion 07/21/2014   DDD (degenerative disc disease), thoracic 07/21/2014   DDD (degenerative disc disease), lumbar 07/21/2014   Facet syndrome, lumbar 07/21/2014   Sacroiliac joint dysfunction 07/21/2014   Occipital neuralgia 07/21/2014   DJD of shoulder 07/21/2014   Intractable migraine with aura without status migrainosus 02/27/2014   Bipolar disorder ([place] 02/17/2014   [place] limitation) [place] 02/17/2014   Fibrositis 02/17/2014   Cephalalgia 02/17/2014   HLD (hyperlipidemia) 02/17/2014   Impaired renal function 02/17/2014   Restless leg 02/17/2014   Apnea, sleep 02/17/2014    Chronic pain associated with significant psychosocial dysfunction 11/28/2013    Thank you for your referral.  MJoneen BoersPT, DPT   09/07/2020, 2:15 PM  [facility] [address] [place] [address]Phone: [phone]  Fax:  [phone] Name: Briana MCANDREWMRN: [id number] of Birth: 109-22-1967

## 2020-09-09 ENCOUNTER — Ambulatory Visit: Payer: Medicare Other

## 2020-09-09 DIAGNOSIS — M25512 Pain in left shoulder: Secondary | ICD-10-CM | POA: Diagnosis not present

## 2020-09-09 DIAGNOSIS — M542 Cervicalgia: Secondary | ICD-10-CM

## 2020-09-09 NOTE — Therapy (Signed)
[facility] [address] [place], [address] Phone: [phone]   Fax:  [phone]  Physical Therapy Treatment  Patient Details  Name: Briana [name] MRN: [id number] Date of Birth: 01/10/1966 Referring Provider (PT): Gayland Curry, [place]   Encounter Date: 09/09/2020   PT End of Session - 09/09/20 1330     Visit Number 11    Number of Visits 17    Date for PT Re-Evaluation 09/24/20    Authorization Type UHC Medicare[place]    Authorization Time Period Cert 0/10/93-2/35/57    PT Start Time 1330    PT Stop Time 1410    PT Time Calculation (min) 40 min    Activity Tolerance Patient tolerated treatment well;No increased pain    Behavior During Therapy [facility] for tasks assessed/performed             Past Medical History:  Diagnosis Date   Abnormal Pap smear of cervix    01/2015 ascus/neg- 04/2015 ascus/neg   Allergy    Anxiety    Arthritis    Asthma    Breast cancer ([place]) 2019   Chronic kidney disease    COPD (chronic obstructive pulmonary disease) (HCC)    DDD (degenerative disc disease), lumbar    Depression    Elevated lipids    Fibromyalgia    Fibromyalgia    Genetic testing 03/09/2017   Multi-Cancer panel (83 genes) @ [facility] - No pathogenic mutations detected   GERD (gastroesophageal reflux disease)    History of IBS    Hyperthyroidism    Joint disease    Migraine    Migraine    Oxygen deficiency    Personal history of chemotherapy    Personal history of radiation therapy    Restless leg syndrome    Sleep apnea    Does not use C-PAP, cannot tolerate mask    Past Surgical History:  Procedure Laterality Date   ABDOMINAL HYSTERECTOMY  2008   BREAST BIOPSY Left 02/02/2017   [place] core positive   BREAST LUMPECTOMY Left 02/17/2017   BREAST LUMPECTOMY WITH NEEDLE LOCALIZATION Left 02/17/2017   Procedure: BREAST LUMPECTOMY WITH NEEDLE LOCALIZATION;  Surgeon: Vickie Epley, MD;  Location:  [facility];  Service: General;  Laterality: Left;   CARPAL TUNNEL RELEASE Bilateral    cryotherapy     DILATION AND CURETTAGE OF UTERUS     ENDOMETRIAL ABLATION     LAPAROSCOPIC OOPHERECTOMY Left    unsure which side but thinks its the left   LYSIS OF ADHESION Left 10/14/2015   Procedure: LYSIS OF ADHESION;  Surgeon: Thornton Park, MD;  Location: [facility];  Service: Orthopedics;  Laterality: Left;   NECK SURGERY     lower neck fusion rods and screws   OOPHORECTOMY     one ovary removed    PORTA CATH INSERTION N/A 03/08/2017   Procedure: PORTA CATH INSERTION;  Surgeon: Katha Cabal, MD;  Location: [facility];  Service: Cardiovascular;  Laterality: N/A;   RESECTION DISTAL CLAVICAL Left 10/14/2015   Procedure: RESECTION DISTAL CLAVICAL;  Surgeon: Thornton Park, MD;  Location: [facility];  Service: Orthopedics;  Laterality: Left;   SENTINEL NODE BIOPSY Left 02/17/2017   Procedure: SENTINEL NODE BIOPSY;  Surgeon: Vickie Epley, MD;  Location: [facility];  Service: General;  Laterality: Left;   SHOULDER ARTHROSCOPY WITH OPEN ROTATOR CUFF REPAIR Left 10/14/2015   Procedure: SHOULDER ARTHROSCOPY WITH OPEN ROTATOR CUFF REPAIR;  Surgeon: Thornton Park, MD;  Location: [facility];  Service: Orthopedics;  Laterality: Left;   SHOULDER ARTHROSCOPY WITH OPEN ROTATOR CUFF REPAIR AND DISTAL CLAVICLE ACROMINECTOMY Right 09/03/2014   Procedure: RIGHT SHOULDER ARTHROSCOPY WITH MINI OPEN ROTATOR CUFF TEAR;  Surgeon: Thornton Park, MD;  Location: [facility];  Service: Orthopedics;  Laterality: Right;  biceps tenodesis, arthroscopic subacromial decompression and distal clavicle incision   spinal injections     SUBACROMIAL DECOMPRESSION Left 10/14/2015   Procedure: SUBACROMIAL DECOMPRESSION;  Surgeon: Thornton Park, MD;  Location: [facility];  Service: Orthopedics;  Laterality: Left;    There were no vitals filed for this visit.   Subjective Assessment - 09/09/20 1331     Subjective L shoulder is  about a 2/10 currently at the joint.    Pertinent History L shoulder pain. Pain starts from the L side of her neck which moves to her L shoulder. Pt also states having R mid low back pain. Also states having degenerative joint and disc disease. Goes to pain management. L shoulder pain began about 3 weeks ago, sudden onset, unknown mechanism of injury. Feels tight L posteror shoulder. Felt ok about 3 days ago but a day or two later, pain returned. Pt is mainly R hand dominant. Does not understand what is going on. L shoulder was worse after the surgery and does not understand it.    Currently in Pain? Yes    Pain Score 2                                        PT Education - 09/09/20 1333     Education provided Yes    Education Details ther-ex    [employer]) Educated Patient    Methods Explanation;Demonstration;Tactile cues;Verbal cues    Comprehension Returned demonstration;Verbalized understanding           Objective   Latex allergies   Nothing tight for L arm (such as blood pressure cuff) secondary breast CA and surgery.    Hx of B rotator cuff surgery and disc repair in neck.    [employer]   Muscle tension L upper trap   [employer] Access Code [email or web address]    Manual therapy    STM L lower trap muscle area, anterior shoulder and arm and lateral shoulder and arm to decrease fascial restrictions   Seated STM L cervical paraspinal muscles to decrease tension and fascial restrictions     137 degrees L shoulder flexion afterwards.    Therapeutic exercise   Seated self L shoulder inferior glide 10x5 seconds for 2 sets  Manually resisted L shoulder extension isometrics in neutral 10x5 seconds for 3 sets  Manually resisted scapular retraction targeting lower trap             L 10x5 seconds for 3 sets  Seated manually resisted scapular depression isometrics             L 10x5 seconds for 3 sets   Seated manually resisted L elbow extension isometrics  10x3 with 5 second holds  Standing L shoulder ER yellow band 10x3     Improved exercise technique, movement at target joints, use of target muscles after min to mod verbal, visual, tactile cues.        Response to treatment Pt tolerated session well without aggravation of symptoms.        Clinical impression Pt continues to do well with  PT, able to achieve 137 degrees L shoulder flexion after session. Improved ROM to decrease soft tissue restrictions around shoulder. Continued working on improving scapular, posterior shoulder and ER muscle strength to promote better glenohumeral mechanics. Pt tolerated session well without aggravation of symptoms. Pt will benefit from continued skilled physical therapy services to decrease pain, improve strength and function.            PT Short Term Goals - 09/07/20 1335       PT SHORT TERM GOAL #1   Title Pt will be independent with her initial HEP to decrease pain, improve strength, ROM, function, and ability to reach more comfortably.    Baseline Does her HEP, no other questions (09/07/2020)    Time 3    Period Weeks    Status Achieved    Target Date 08/20/20               PT Long Term Goals - 09/07/20 1337       PT LONG TERM GOAL #1   Title Pt will have a decrease in L shoulder joint pain to 3/10 or less at worst to promote ability to raise her arm, reach, and carry items more comfortably.    Baseline 8/10 L shoulder pain at most for the past 2 weeks (07/29/2020); 3/10 at most for the past 7 days (09/07/2020)    Time 8    Period Weeks    Status Achieved    Target Date 09/24/20      PT LONG TERM GOAL #2   Title Pt will have a decrease in L lateral neck pain to 3/10 or less at worst to promote ability to raise her arm, reach, and carry items more comfortably.    Baseline 8/10 L lateral neck pain at most for the past 2 weeks (07/29/2020); 2/10 at most for the past 7 days (09/07/2020)    Time 8    Period Weeks    Status Achieved     Target Date 09/24/20      PT LONG TERM GOAL #3   Title Pt will improve L shoulder flexion AROM to 140 degrees or more to promote ability to raise her arm and reach more comfortably.    Baseline 112 degrees L shoulder flexion AROM with pain (07/29/2020); 135 degrees L shoulder flexion AROM (09/07/2020)    Time 8    Period Weeks    Status Partially Met    Target Date 09/24/20      PT LONG TERM GOAL #4   Title Pt will improve L shoulder flexion, abduction, ER, IR strength by at least 1/2 MMT grade to promote ability so use her L UE for functional tasks more comfortably.    Baseline 4/5 L shoulder strength all planes (07/29/2020); 4+/5 all planes (09/07/2020)    Time 8    Period Weeks    Status Achieved    Target Date 09/24/20      PT LONG TERM GOAL #5   Title Pt will improve L shoulder FOTO score by at least 10 points as a demonstration of improved function.    Baseline L shoulder FOTO 53 (07/29/2020); 59 (08/31/2020)    Time 8    Period Weeks    Status On-going    Target Date 09/24/20                   Plan - 09/09/20 1324     Clinical Impression Statement Pt continues to do well with PT,  able to achieve 137 degrees L shoulder flexion after session. Improved ROM to decrease soft tissue restrictions around shoulder. Continued working on improving scapular, posterior shoulder and ER muscle strength to promote better glenohumeral mechanics. Pt tolerated session well without aggravation of symptoms. Pt will benefit from continued skilled physical therapy services to decrease pain, improve strength and function.    Personal Factors and Comorbidities Comorbidity 3+;Time since onset of injury/illness/exacerbation;Past/Current Experience    Comorbidities DDD, depression, fibromyalgia, anxiety, COPD, DDD, hx of breast CA S/P surgery, neck surgery    Examination-Activity Limitations [place]    Stability/Clinical Decision Making Stable/Uncomplicated    Clinical Decision Making Moderate    Rehab  Potential Fair    Clinical Impairments Affecting Rehab Potential multiple co-morbidities    PT Frequency 2x / week    PT Duration 8 weeks    PT Treatment/Interventions Therapeutic activities;Therapeutic exercise;Neuromuscular re-education;Patient/family education;Manual techniques;Dry needling;Electrical Stimulation;Iontophoresis 38m/ml Dexamethasone    PT Next Visit Plan Posture, scapular and shoulder strength, ROM, manual techniques, modalities PRN    PT Home Exercise Plan AROM supination/pronation in painfree range; L digit composite flexion and extension; Ball squeeze     Consulted and Agree with Plan of Care Patient             Patient will benefit from skilled therapeutic intervention in order to improve the following deficits and impairments:  Pain, Improper body mechanics, Decreased strength  Visit Diagnosis: Acute pain of left shoulder  Cervicalgia     Problem List Patient Active Problem List   Diagnosis Date Noted   Genetic testing 03/09/2017   Rotator cuff tear 02/07/2017   Shoulder pain 02/07/2017   Malignant neoplasm of upper-outer quadrant of left breast in female, estrogen receptor positive ([place] 02/07/2017   History of hysterectomy 03/16/2016   Rotator cuff (capsule) sprain 10/15/2015   S/P rotator cuff repair 10/14/2015   Atypical squamous cells of undetermined significance on cytologic smear of cervix (ASC-US) 05/13/2015   DDD (degenerative disc disease), cervical 07/21/2014   Status post cervical spinal fusion 07/21/2014   DDD (degenerative disc disease), thoracic 07/21/2014   DDD (degenerative disc disease), lumbar 07/21/2014   Facet syndrome, lumbar 07/21/2014   Sacroiliac joint dysfunction 07/21/2014   Occipital neuralgia 07/21/2014   DJD of shoulder 07/21/2014   Intractable migraine with aura without status migrainosus 02/27/2014   [place]ipolar disorder ([place] 02/17/2014   CAFL (chronic airflow limitation) ([place] 02/17/2014   Fibrositis 02/17/2014    Cephalalgia 02/17/2014   HLD (hyperlipidemia) 02/17/2014   Impaired renal function 02/17/2014   Restless leg 02/17/2014   Apnea, sleep 02/17/2014   Chronic pain associated with significant psychosocial dysfunction 11/28/2013    MJoneen BoersPT, DPT   09/09/2020, 2:14 PM  [facility] [address] [place] [address]Phone: [phone]  Fax:  [phone] Name: Briana MADDISONMRN: [id number] of Birth: 118-Feb-1967

## 2020-09-10 DIAGNOSIS — E785 Hyperlipidemia, unspecified: Secondary | ICD-10-CM | POA: Diagnosis not present

## 2020-09-10 DIAGNOSIS — G894 Chronic pain syndrome: Secondary | ICD-10-CM | POA: Diagnosis not present

## 2020-09-10 DIAGNOSIS — M797 Fibromyalgia: Secondary | ICD-10-CM | POA: Diagnosis not present

## 2020-09-10 DIAGNOSIS — J449 Chronic obstructive pulmonary disease, unspecified: Secondary | ICD-10-CM | POA: Diagnosis not present

## 2020-09-10 DIAGNOSIS — Z720 Tobacco use: Secondary | ICD-10-CM | POA: Diagnosis not present

## 2020-09-10 DIAGNOSIS — G4730 Sleep apnea, unspecified: Secondary | ICD-10-CM | POA: Diagnosis not present

## 2020-09-14 ENCOUNTER — Ambulatory Visit: Payer: Medicare Other

## 2020-09-14 DIAGNOSIS — M25512 Pain in left shoulder: Secondary | ICD-10-CM

## 2020-09-14 DIAGNOSIS — M542 Cervicalgia: Secondary | ICD-10-CM

## 2020-09-14 NOTE — Therapy (Signed)
left shoulder  Cervicalgia     Problem List Patient Active Problem List   Diagnosis Date Noted   Genetic testing 03/09/2017   Rotator cuff tear 02/07/2017   Shoulder pain 02/07/2017   Malignant neoplasm of upper-outer quadrant of left breast in female, estrogen receptor positive  ([place]) 02/07/2017   History of hysterectomy 03/16/2016   Rotator cuff (capsule) sprain 10/15/2015   S/P rotator cuff repair 10/14/2015   Atypical squamous cells of undetermined significance on cytologic smear of cervix (ASC-US) 05/13/2015   DDD (degenerative disc disease), cervical 07/21/2014   Status post cervical spinal fusion 07/21/2014   DDD (degenerative disc disease), thoracic 07/21/2014   DDD (degenerative disc disease), lumbar 07/21/2014   Facet syndrome, lumbar 07/21/2014   Sacroiliac joint dysfunction 07/21/2014   Occipital neuralgia 07/21/2014   DJD of shoulder 07/21/2014   Intractable migraine with aura without status migrainosus 02/27/2014   Bipolar disorder ([place]) 02/17/2014   CAFL (chronic airflow limitation) ([place]) 02/17/2014   Fibrositis 02/17/2014   Cephalalgia 02/17/2014   HLD (hyperlipidemia) 02/17/2014   Impaired renal function 02/17/2014   Restless leg 02/17/2014   Apnea, sleep 02/17/2014   Chronic pain associated with significant psychosocial dysfunction 11/28/2013   5:09 PM, 09/14/20 Etta Grandchild, PT, DPT Physical Therapist - [place] [phone] (Office)   Ruthia Person C 09/14/2020, 4:52 PM  [facility] [address] [place], [address] Phone: [phone]   Fax:  [phone]  Name: Briana [name] MRN: [id number] Date of Birth: July 26, 1965  [facility] [address] [place], [address] Phone: [phone]   Fax:  [phone]  Physical Therapy Treatment  Patient Details  Name: Briana [name] MRN: [id number] Date of Birth: Jun 30, 1965 Referring Provider (PT): Gayland Curry, [place]   Encounter Date: 09/14/2020   PT End of Session - 09/14/20 1645     Visit Number 12    Number of Visits 17    Date for PT Re-Evaluation 09/24/20    Authorization Type UHC Medicare: [place]    Authorization Time Period Cert 09/28/98-3/49/17    PT Start Time 1635    PT Stop Time 9150    PT Time Calculation (min) 40 min    Activity Tolerance Patient tolerated treatment well;No increased pain    Behavior During Therapy [facility] for tasks assessed/performed             Past Medical History:  Diagnosis Date   Abnormal Pap smear of cervix    01/2015 ascus/neg- 04/2015 ascus/neg   Allergy    Anxiety    Arthritis    Asthma    Breast cancer ([place]) 2019   Chronic kidney disease    COPD (chronic obstructive pulmonary disease) (HCC)    DDD (degenerative disc disease), lumbar    Depression    Elevated lipids    Fibromyalgia    Fibromyalgia    Genetic testing 03/09/2017   Multi-Cancer panel (83 genes) @ [facility] - No pathogenic mutations detected   GERD (gastroesophageal reflux disease)    History of IBS    Hyperthyroidism    Joint disease    Migraine    Migraine    Oxygen deficiency    Personal history of chemotherapy    Personal history of radiation therapy    Restless leg syndrome    Sleep apnea    Does not use C-PAP, cannot tolerate mask    Past Surgical History:  Procedure Laterality Date   ABDOMINAL HYSTERECTOMY  2008   BREAST BIOPSY Left 02/02/2017   [place] core positive   BREAST LUMPECTOMY Left 02/17/2017   BREAST LUMPECTOMY WITH NEEDLE LOCALIZATION Left 02/17/2017   Procedure: BREAST LUMPECTOMY WITH NEEDLE LOCALIZATION;  Surgeon: Vickie Epley, MD;  Location:  [facility];  Service: General;  Laterality: Left;   CARPAL TUNNEL RELEASE Bilateral    cryotherapy     DILATION AND CURETTAGE OF UTERUS     ENDOMETRIAL ABLATION     LAPAROSCOPIC OOPHERECTOMY Left    unsure which side but thinks its the left   LYSIS OF ADHESION Left 10/14/2015   Procedure: LYSIS OF ADHESION;  Surgeon: Thornton Park, MD;  Location: [facility];  Service: Orthopedics;  Laterality: Left;   NECK SURGERY     lower neck fusion rods and screws   OOPHORECTOMY     one ovary removed    PORTA CATH INSERTION N/A 03/08/2017   Procedure: PORTA CATH INSERTION;  Surgeon: Katha Cabal, MD;  Location: [place] [facility];  Service: Cardiovascular;  Laterality: N/A;   RESECTION DISTAL CLAVICAL Left 10/14/2015   Procedure: RESECTION DISTAL CLAVICAL;  Surgeon: Thornton Park, MD;  Location: [facility];  Service: Orthopedics;  Laterality: Left;   SENTINEL NODE BIOPSY Left 02/17/2017   Procedure: SENTINEL NODE BIOPSY;  Surgeon: Vickie Epley, MD;  Location: [facility];  Service: General;  Laterality: Left;   SHOULDER ARTHROSCOPY WITH OPEN ROTATOR CUFF REPAIR Left 10/14/2015   Procedure: SHOULDER ARTHROSCOPY WITH OPEN ROTATOR CUFF REPAIR;  left shoulder  Cervicalgia     Problem List Patient Active Problem List   Diagnosis Date Noted   Genetic testing 03/09/2017   Rotator cuff tear 02/07/2017   Shoulder pain 02/07/2017   Malignant neoplasm of upper-outer quadrant of left breast in female, estrogen receptor positive  ([place]) 02/07/2017   History of hysterectomy 03/16/2016   Rotator cuff (capsule) sprain 10/15/2015   S/P rotator cuff repair 10/14/2015   Atypical squamous cells of undetermined significance on cytologic smear of cervix (ASC-US) 05/13/2015   DDD (degenerative disc disease), cervical 07/21/2014   Status post cervical spinal fusion 07/21/2014   DDD (degenerative disc disease), thoracic 07/21/2014   DDD (degenerative disc disease), lumbar 07/21/2014   Facet syndrome, lumbar 07/21/2014   Sacroiliac joint dysfunction 07/21/2014   Occipital neuralgia 07/21/2014   DJD of shoulder 07/21/2014   Intractable migraine with aura without status migrainosus 02/27/2014   Bipolar disorder ([place]) 02/17/2014   CAFL (chronic airflow limitation) ([place]) 02/17/2014   Fibrositis 02/17/2014   Cephalalgia 02/17/2014   HLD (hyperlipidemia) 02/17/2014   Impaired renal function 02/17/2014   Restless leg 02/17/2014   Apnea, sleep 02/17/2014   Chronic pain associated with significant psychosocial dysfunction 11/28/2013   5:09 PM, 09/14/20 Etta Grandchild, PT, DPT Physical Therapist - [place] [phone] (Office)   Ruthia Person C 09/14/2020, 4:52 PM  [facility] [address] [place], [address] Phone: [phone]   Fax:  [phone]  Name: Briana [name] MRN: [id number] Date of Birth: July 26, 1965  [place] [address] [place], [address] Phone: [phone]   Fax:  [phone]  Physical Therapy Treatment  Patient Details  Name: Briana [name] MRN: [id number] Date of Birth: Jun 30, 1965 Referring Provider (PT): Gayland Curry, [place]   Encounter Date: 09/14/2020   PT End of Session - 09/14/20 1645     Visit Number 12    Number of Visits 17    Date for PT Re-Evaluation 09/24/20    Authorization Type UHC Medicare: [place]    Authorization Time Period Cert 09/28/98-3/49/17    PT Start Time 1635    PT Stop Time 9150    PT Time Calculation (min) 40 min    Activity Tolerance Patient tolerated treatment well;No increased pain    Behavior During Therapy [facility] for tasks assessed/performed             Past Medical History:  Diagnosis Date   Abnormal Pap smear of cervix    01/2015 ascus/neg- 04/2015 ascus/neg   Allergy    Anxiety    Arthritis    Asthma    Breast cancer ([place]) 2019   Chronic kidney disease    COPD (chronic obstructive pulmonary disease) (HCC)    DDD (degenerative disc disease), lumbar    Depression    Elevated lipids    Fibromyalgia    Fibromyalgia    Genetic testing 03/09/2017   Multi-Cancer panel (83 genes) @ [facility] - No pathogenic mutations detected   GERD (gastroesophageal reflux disease)    History of IBS    Hyperthyroidism    Joint disease    Migraine    Migraine    Oxygen deficiency    Personal history of chemotherapy    Personal history of radiation therapy    Restless leg syndrome    Sleep apnea    Does not use C-PAP, cannot tolerate mask    Past Surgical History:  Procedure Laterality Date   ABDOMINAL HYSTERECTOMY  2008   BREAST BIOPSY Left 02/02/2017   [place] core positive   BREAST LUMPECTOMY Left 02/17/2017   BREAST LUMPECTOMY WITH NEEDLE LOCALIZATION Left 02/17/2017   Procedure: BREAST LUMPECTOMY WITH NEEDLE LOCALIZATION;  Surgeon: Vickie Epley, MD;  Location:  [facility];  Service: General;  Laterality: Left;   CARPAL TUNNEL RELEASE Bilateral    cryotherapy     DILATION AND CURETTAGE OF UTERUS     ENDOMETRIAL ABLATION     LAPAROSCOPIC OOPHERECTOMY Left    unsure which side but thinks its the left   LYSIS OF ADHESION Left 10/14/2015   Procedure: LYSIS OF ADHESION;  Surgeon: Thornton Park, MD;  Location: [facility];  Service: Orthopedics;  Laterality: Left;   NECK SURGERY     lower neck fusion rods and screws   OOPHORECTOMY     one ovary removed    PORTA CATH INSERTION N/A 03/08/2017   Procedure: PORTA CATH INSERTION;  Surgeon: Katha Cabal, MD;  Location: [place] [facility];  Service: Cardiovascular;  Laterality: N/A;   RESECTION DISTAL CLAVICAL Left 10/14/2015   Procedure: RESECTION DISTAL CLAVICAL;  Surgeon: Thornton Park, MD;  Location: [facility];  Service: Orthopedics;  Laterality: Left;   SENTINEL NODE BIOPSY Left 02/17/2017   Procedure: SENTINEL NODE BIOPSY;  Surgeon: Vickie Epley, MD;  Location: [facility];  Service: General;  Laterality: Left;   SHOULDER ARTHROSCOPY WITH OPEN ROTATOR CUFF REPAIR Left 10/14/2015   Procedure: SHOULDER ARTHROSCOPY WITH OPEN ROTATOR CUFF REPAIR;

## 2020-09-15 DIAGNOSIS — M47897 Other spondylosis, lumbosacral region: Secondary | ICD-10-CM | POA: Diagnosis not present

## 2020-09-15 DIAGNOSIS — R519 Headache, unspecified: Secondary | ICD-10-CM | POA: Diagnosis not present

## 2020-09-15 DIAGNOSIS — M4726 Other spondylosis with radiculopathy, lumbar region: Secondary | ICD-10-CM | POA: Diagnosis not present

## 2020-09-15 DIAGNOSIS — M179 Osteoarthritis of knee, unspecified: Secondary | ICD-10-CM | POA: Diagnosis not present

## 2020-09-15 DIAGNOSIS — G4700 Insomnia, unspecified: Secondary | ICD-10-CM | POA: Diagnosis not present

## 2020-09-15 DIAGNOSIS — E0840 Diabetes mellitus due to underlying condition with diabetic neuropathy, unspecified: Secondary | ICD-10-CM | POA: Diagnosis not present

## 2020-09-15 DIAGNOSIS — G8929 Other chronic pain: Secondary | ICD-10-CM | POA: Diagnosis not present

## 2020-09-15 DIAGNOSIS — G43001 Migraine without aura, not intractable, with status migrainosus: Secondary | ICD-10-CM | POA: Diagnosis not present

## 2020-09-15 DIAGNOSIS — M4807 Spinal stenosis, lumbosacral region: Secondary | ICD-10-CM | POA: Diagnosis not present

## 2020-09-15 DIAGNOSIS — M9931 Osseous stenosis of neural canal of cervical region: Secondary | ICD-10-CM | POA: Diagnosis not present

## 2020-09-15 DIAGNOSIS — M259 Joint disorder, unspecified: Secondary | ICD-10-CM | POA: Diagnosis not present

## 2020-09-15 DIAGNOSIS — G894 Chronic pain syndrome: Secondary | ICD-10-CM | POA: Diagnosis not present

## 2020-09-15 DIAGNOSIS — M5136 Other intervertebral disc degeneration, lumbar region: Secondary | ICD-10-CM | POA: Diagnosis not present

## 2020-09-15 DIAGNOSIS — M792 Neuralgia and neuritis, unspecified: Secondary | ICD-10-CM | POA: Diagnosis not present

## 2020-09-15 DIAGNOSIS — M9951 Intervertebral disc stenosis of neural canal of cervical region: Secondary | ICD-10-CM | POA: Diagnosis not present

## 2020-09-15 DIAGNOSIS — M48062 Spinal stenosis, lumbar region with neurogenic claudication: Secondary | ICD-10-CM | POA: Diagnosis not present

## 2020-09-16 ENCOUNTER — Ambulatory Visit: Payer: Medicare Other

## 2020-09-16 DIAGNOSIS — M25512 Pain in left shoulder: Secondary | ICD-10-CM

## 2020-09-16 DIAGNOSIS — M542 Cervicalgia: Secondary | ICD-10-CM | POA: Diagnosis not present

## 2020-09-16 NOTE — Therapy (Signed)
[facility] [address] [place], [address] Phone: [phone]   Fax:  [phone]  Physical Therapy Treatment  Patient Details  Name: Briana [name] MRN: [id number] Date of Birth: 07-10-1965 Referring Provider (PT): Gayland Curry, [place]   Encounter Date: 09/16/2020   PT End of Session - 09/16/20 1638     Visit Number 13    Number of Visits 17    Date for PT Re-Evaluation 09/24/20    Authorization Type UHC Medicare: [place]    Authorization Time Period Cert 0/25/85-2/77/82    PT Start Time 1638    PT Stop Time 1717    PT Time Calculation (min) 39 min    Activity Tolerance Patient tolerated treatment well;No increased pain    Behavior During Therapy [facility] for tasks assessed/performed             Past Medical History:  Diagnosis Date   Abnormal Pap smear of cervix    01/2015 ascus/neg- 04/2015 ascus/neg   Allergy    Anxiety    Arthritis    Asthma    Breast cancer ([place]) 2019   Chronic kidney disease    COPD (chronic obstructive pulmonary disease) (HCC)    DDD (degenerative disc disease), lumbar    Depression    Elevated lipids    Fibromyalgia    Fibromyalgia    Genetic testing 03/09/2017   Multi-Cancer panel (83 genes) @ [facility] - No pathogenic mutations detected   GERD (gastroesophageal reflux disease)    History of IBS    Hyperthyroidism    Joint disease    Migraine    Migraine    Oxygen deficiency    Personal history of chemotherapy    Personal history of radiation therapy    Restless leg syndrome    Sleep apnea    Does not use C-PAP, cannot tolerate mask    Past Surgical History:  Procedure Laterality Date   ABDOMINAL HYSTERECTOMY  2008   BREAST BIOPSY Left 02/02/2017   [place] core positive   BREAST LUMPECTOMY Left 02/17/2017   BREAST LUMPECTOMY WITH NEEDLE LOCALIZATION Left 02/17/2017   Procedure: BREAST LUMPECTOMY WITH NEEDLE LOCALIZATION;  Surgeon: Vickie Epley, MD;  Location:  [facility];  Service: General;  Laterality: Left;   CARPAL TUNNEL RELEASE Bilateral    cryotherapy     DILATION AND CURETTAGE OF UTERUS     ENDOMETRIAL ABLATION     LAPAROSCOPIC OOPHERECTOMY Left    unsure which side but thinks its the left   LYSIS OF ADHESION Left 10/14/2015   Procedure: LYSIS OF ADHESION;  Surgeon: Thornton Park, MD;  Location: [facility];  Service: Orthopedics;  Laterality: Left;   NECK SURGERY     lower neck fusion rods and screws   OOPHORECTOMY     one ovary removed    PORTA CATH INSERTION N/A 03/08/2017   Procedure: PORTA CATH INSERTION;  Surgeon: Katha Cabal, MD;  Location: [place] [facility];  Service: Cardiovascular;  Laterality: N/A;   RESECTION DISTAL CLAVICAL Left 10/14/2015   Procedure: RESECTION DISTAL CLAVICAL;  Surgeon: Thornton Park, MD;  Location: [facility];  Service: Orthopedics;  Laterality: Left;   SENTINEL NODE BIOPSY Left 02/17/2017   Procedure: SENTINEL NODE BIOPSY;  Surgeon: Vickie Epley, MD;  Location: [facility];  Service: General;  Laterality: Left;   SHOULDER ARTHROSCOPY WITH OPEN ROTATOR CUFF REPAIR Left 10/14/2015   Procedure: SHOULDER ARTHROSCOPY WITH OPEN ROTATOR CUFF REPAIR;  Surgeon: Thornton Park, MD;  Location: [facility];  Service: Orthopedics;  Laterality: Left;   SHOULDER ARTHROSCOPY WITH OPEN ROTATOR CUFF REPAIR AND DISTAL CLAVICLE ACROMINECTOMY Right 09/03/2014   Procedure: RIGHT SHOULDER ARTHROSCOPY WITH MINI OPEN ROTATOR CUFF TEAR;  Surgeon: Thornton Park, MD;  Location: [facility];  Service: Orthopedics;  Laterality: Right;  biceps tenodesis, arthroscopic subacromial decompression and distal clavicle incision   spinal injections     SUBACROMIAL DECOMPRESSION Left 10/14/2015   Procedure: SUBACROMIAL DECOMPRESSION;  Surgeon: Thornton Park, MD;  Location: [facility];  Service: Orthopedics;  Laterality: Left;    There were no vitals filed for this visit.   Subjective Assessment - 09/16/20 1639     Subjective L shoulder is  doing pretty good. About a 3/10 currently.    Pertinent History L shoulder pain. Pain starts from the L side of her neck which moves to her L shoulder. Pt also states having R mid low back pain. Also states having degenerative joint and disc disease. Goes to pain management. L shoulder pain began about 3 weeks ago, sudden onset, unknown mechanism of injury. Feels tight L posteror shoulder. Felt ok about 3 days ago but a day or two later, pain returned. Pt is mainly R hand dominant. Does not understand what is going on. L shoulder was worse after the surgery and does not understand it.    Currently in Pain? Yes    Pain Score 3                                        PT Education - 09/16/20 1728     Education provided Yes    Education Details ther-ex    [employer]) Educated Patient    Methods Explanation;Demonstration;Tactile cues;Verbal cues    Comprehension Returned demonstration;Verbalized understanding           Objective   Latex allergies   Nothing tight for L arm (such as blood pressure cuff) secondary breast CA and surgery.    Hx of B rotator cuff surgery and disc repair in neck.    [employer]   Muscle tension L upper trap   [employer] Access Code [email or web address]     Manual therapy   Seated STM L pectoralis muscle inferior to clavicle to decrease muscle tension  STM L lower trap muscle area, anterior shoulder and arm and lateral shoulder and arm to decrease fascial restrictions   Seated STM L cervical paraspinal muscles to decrease tension and fascial restrictions      Therapeutic exercise    Manually resisted scapular retraction targeting lower trap             L 10x5 seconds for 3 sets   Seated manually resisted scapular depression isometrics             L 10x5 seconds for 3 sets    Manually resisted L shoulder extension isometrics in neutral 10x5 seconds for 3 sets    Seated manually resisted L elbow extension isometrics 10x3 with 5  second holds  Standing L shoulder ER red band 10x  Then yellow band 10x  Cues needed to decrease elbow compensation and decrease shoulder shrug.     Improved exercise technique, movement at target joints, use of target muscles after min to mod verbal, visual, tactile cues.        Response to treatment Pt tolerated session well without aggravation of symptoms. No  L shoulder pain with flexion after session.          Clinical impression Continued working on improving scapular strength and muscles to promote posterior glide of glenohumeral head to promote better mechanics when rasing her L arm up. Difficulty with L scapular retraction and posterior tipping with exercises, needing cues to perform. No L shoulder pain with flexion after treatment. Pt will benefit from continued skilled physical therapy services to decrease pain, improve strength and function.       PT Short Term Goals - 09/07/20 1335       PT SHORT TERM GOAL #1   Title Pt will be independent with her initial HEP to decrease pain, improve strength, ROM, function, and ability to reach more comfortably.    Baseline Does her HEP, no other questions (09/07/2020)    Time 3    Period Weeks    Status Achieved    Target Date 08/20/20               PT Long Term Goals - 09/07/20 1337       PT LONG TERM GOAL #1   Title Pt will have a decrease in L shoulder joint pain to 3/10 or less at worst to promote ability to raise her arm, reach, and carry items more comfortably.    Baseline 8/10 L shoulder pain at most for the past 2 weeks (07/29/2020); 3/10 at most for the past 7 days (09/07/2020)    Time 8    Period Weeks    Status Achieved    Target Date 09/24/20      PT LONG TERM GOAL #2   Title Pt will have a decrease in L lateral neck pain to 3/10 or less at worst to promote ability to raise her arm, reach, and carry items more comfortably.    Baseline 8/10 L lateral neck pain at most for the past 2 weeks (07/29/2020); 2/10  at most for the past 7 days (09/07/2020)    Time 8    Period Weeks    Status Achieved    Target Date 09/24/20      PT LONG TERM GOAL #3   Title Pt will improve L shoulder flexion AROM to 140 degrees or more to promote ability to raise her arm and reach more comfortably.    Baseline 112 degrees L shoulder flexion AROM with pain (07/29/2020); 135 degrees L shoulder flexion AROM (09/07/2020)    Time 8    Period Weeks    Status Partially Met    Target Date 09/24/20      PT LONG TERM GOAL #4   Title Pt will improve L shoulder flexion, abduction, ER, IR strength by at least 1/2 MMT grade to promote ability so use her L UE for functional tasks more comfortably.    Baseline 4/5 L shoulder strength all planes (07/29/2020); 4+/5 all planes (09/07/2020)    Time 8    Period Weeks    Status Achieved    Target Date 09/24/20      PT LONG TERM GOAL #5   Title Pt will improve L shoulder FOTO score by at least 10 points as a demonstration of improved function.    Baseline L shoulder FOTO 53 (07/29/2020); 59 (08/31/2020)    Time 8    Period Weeks    Status On-going    Target Date 09/24/20                   Plan -  09/16/20 1726     Clinical Impression Statement Continued working on improving scapular strength and muscles to promote posterior glide of glenohumeral head to promote better mechanics when rasing her L arm up. Difficulty with L scapular retraction and posterior tipping with exercises, needing cues to perform. No L shoulder pain with flexion after treatment. Pt will benefit from continued skilled physical therapy services to decrease pain, improve strength and function.    Personal Factors and Comorbidities Comorbidity 3+;Time since onset of injury/illness/exacerbation;Past/Current Experience    Comorbidities DDD, depression, fibromyalgia, anxiety, COPD, DDD, hx of breast CA S/P surgery, neck surgery    Examination-Activity Limitations [place]    Stability/Clinical Decision Making  Stable/Uncomplicated    Clinical Decision Making Low    Rehab Potential Fair    Clinical Impairments Affecting Rehab Potential multiple co-morbidities    PT Frequency 2x / week    PT Duration 8 weeks    PT Treatment/Interventions Therapeutic activities;Therapeutic exercise;Neuromuscular re-education;Patient/family education;Manual techniques;Dry needling;Electrical Stimulation;Iontophoresis 104m/ml Dexamethasone    PT Next Visit Plan Posture, scapular and shoulder strength, ROM, manual techniques, modalities PRN    PT Home Exercise Plan AROM supination/pronation in painfree range; L digit composite flexion and extension; Ball squeeze     Consulted and Agree with Plan of Care Patient             Patient will benefit from skilled therapeutic intervention in order to improve the following deficits and impairments:  Pain, Improper body mechanics, Decreased strength  Visit Diagnosis: Acute pain of left shoulder  Cervicalgia     Problem List Patient Active Problem List   Diagnosis Date Noted   Genetic testing 03/09/2017   Rotator cuff tear 02/07/2017   Shoulder pain 02/07/2017   Malignant neoplasm of upper-outer quadrant of left breast in female, estrogen receptor positive ([place] 02/07/2017   History of hysterectomy 03/16/2016   Rotator cuff (capsule) sprain 10/15/2015   S/P rotator cuff repair 10/14/2015   Atypical squamous cells of undetermined significance on cytologic smear of cervix (ASC-US) 05/13/2015   DDD (degenerative disc disease), cervical 07/21/2014   Status post cervical spinal fusion 07/21/2014   DDD (degenerative disc disease), thoracic 07/21/2014   DDD (degenerative disc disease), lumbar 07/21/2014   Facet syndrome, lumbar 07/21/2014   Sacroiliac joint dysfunction 07/21/2014   Occipital neuralgia 07/21/2014   DJD of shoulder 07/21/2014   Intractable migraine with aura without status migrainosus 02/27/2014   Bipolar disorder ([place] 02/17/2014   CAFL (chronic  airflow limitation) ([place] 02/17/2014   Fibrositis 02/17/2014   Cephalalgia 02/17/2014   HLD (hyperlipidemia) 02/17/2014   Impaired renal function 02/17/2014   Restless leg 02/17/2014   Apnea, sleep 02/17/2014   Chronic pain associated with significant psychosocial dysfunction 11/28/2013    MJoneen BoersPT, DPT   09/16/2020, 5:29 PM  [place] [facility] [address] [place] [address]Phone: [phone]  Fax:  [phone] Name: Briana PICKERTMRN: [id number] of Birth: 110-Jan-1967

## 2020-09-21 ENCOUNTER — Ambulatory Visit
Admission: RE | Admit: 2020-09-21 | Discharge: 2020-09-21 | Disposition: A | Discharge status: Home / Self Care | Payer: Medicare Other | Source: Ambulatory Visit | Attending: Oncology | Admitting: Oncology

## 2020-09-21 ENCOUNTER — Other Ambulatory Visit: Payer: Self-pay

## 2020-09-21 DIAGNOSIS — M8589 Other specified disorders of bone density and structure, multiple sites: Secondary | ICD-10-CM | POA: Diagnosis not present

## 2020-09-21 DIAGNOSIS — F17200 Nicotine dependence, unspecified, uncomplicated: Secondary | ICD-10-CM | POA: Insufficient documentation

## 2020-09-21 DIAGNOSIS — Z13820 Encounter for screening for osteoporosis: Secondary | ICD-10-CM | POA: Diagnosis not present

## 2020-09-21 DIAGNOSIS — Z9221 Personal history of antineoplastic chemotherapy: Secondary | ICD-10-CM | POA: Insufficient documentation

## 2020-09-21 DIAGNOSIS — C50412 Malignant neoplasm of upper-outer quadrant of left female breast: Secondary | ICD-10-CM | POA: Diagnosis not present

## 2020-09-21 DIAGNOSIS — Z780 Asymptomatic menopausal state: Secondary | ICD-10-CM | POA: Diagnosis not present

## 2020-09-21 DIAGNOSIS — Z170 Estrogen receptor positive status [ER+]: Secondary | ICD-10-CM | POA: Insufficient documentation

## 2020-09-21 DIAGNOSIS — M85852 Other specified disorders of bone density and structure, left thigh: Secondary | ICD-10-CM | POA: Diagnosis not present

## 2020-09-23 ENCOUNTER — Ambulatory Visit: Payer: Medicare Other

## 2020-09-23 DIAGNOSIS — M542 Cervicalgia: Secondary | ICD-10-CM

## 2020-09-23 DIAGNOSIS — M25512 Pain in left shoulder: Secondary | ICD-10-CM | POA: Diagnosis not present

## 2020-09-23 NOTE — Therapy (Signed)
[facility] [address], [place], [address] Phone: [phone]   Fax:  [phone]  Physical Therapy Treatment  Patient Details  Name: Briana [name] MRN: [id number] Date of Birth: 30-Sep-1965 Referring Provider (PT): Gayland Curry, [place]   Encounter Date: 09/23/2020   PT End of Session - 09/23/20 1540     Visit Number 14    Number of Visits 17    Date for PT Re-Evaluation 09/24/20    Authorization Type UHC Medicare: [place]    Authorization Time Period Cert 8/87/57-9/72/82    PT Start Time 1540    PT Stop Time 1625    PT Time Calculation (min) 45 min    Activity Tolerance Patient tolerated treatment well;No increased pain    Behavior During Therapy [facility] for tasks assessed/performed             Past Medical History:  Diagnosis Date   Abnormal Pap smear of cervix    01/2015 ascus/neg- 04/2015 ascus/neg   Allergy    Anxiety    Arthritis    Asthma    Breast cancer ([place]) 2019   Chronic kidney disease    COPD (chronic obstructive pulmonary disease) (HCC)    DDD (degenerative disc disease), lumbar    Depression    Elevated lipids    Fibromyalgia    Fibromyalgia    Genetic testing 03/09/2017   Multi-Cancer panel (83 genes) @ [facility] - No pathogenic mutations detected   GERD (gastroesophageal reflux disease)    History of IBS    Hyperthyroidism    Joint disease    Migraine    Migraine    Oxygen deficiency    Personal history of chemotherapy    Personal history of radiation therapy    Restless leg syndrome    Sleep apnea    Does not use C-PAP, cannot tolerate mask    Past Surgical History:  Procedure Laterality Date   ABDOMINAL HYSTERECTOMY  2008   BREAST BIOPSY Left 02/02/2017   [place] core positive   BREAST LUMPECTOMY Left 02/17/2017   BREAST LUMPECTOMY WITH NEEDLE LOCALIZATION Left 02/17/2017   Procedure: BREAST LUMPECTOMY WITH NEEDLE LOCALIZATION;  Surgeon: Vickie Epley, MD;  Location:  [facility];  Service: General;  Laterality: Left;   CARPAL TUNNEL RELEASE Bilateral    cryotherapy     DILATION AND CURETTAGE OF UTERUS     ENDOMETRIAL ABLATION     LAPAROSCOPIC OOPHERECTOMY Left    unsure which side but thinks its the left   LYSIS OF ADHESION Left 10/14/2015   Procedure: LYSIS OF ADHESION;  Surgeon: Thornton Park, MD;  Location: [facility];  Service: Orthopedics;  Laterality: Left;   NECK SURGERY     lower neck fusion rods and screws   OOPHORECTOMY     one ovary removed    PORTA CATH INSERTION N/A 03/08/2017   Procedure: PORTA CATH INSERTION;  Surgeon: Katha Cabal, MD;  Location: [place] [facility];  Service: Cardiovascular;  Laterality: N/A;   RESECTION DISTAL CLAVICAL Left 10/14/2015   Procedure: RESECTION DISTAL CLAVICAL;  Surgeon: Thornton Park, MD;  Location: [facility];  Service: Orthopedics;  Laterality: Left;   SENTINEL NODE BIOPSY Left 02/17/2017   Procedure: SENTINEL NODE BIOPSY;  Surgeon: Vickie Epley, MD;  Location: [facility];  Service: General;  Laterality: Left;   SHOULDER ARTHROSCOPY WITH OPEN ROTATOR CUFF REPAIR Left 10/14/2015   Procedure: SHOULDER ARTHROSCOPY WITH OPEN ROTATOR CUFF REPAIR;  Surgeon: Thornton Park, MD;  Location: [facility];  Service: Orthopedics;  Laterality: Left;   SHOULDER ARTHROSCOPY WITH OPEN ROTATOR CUFF REPAIR AND DISTAL CLAVICLE ACROMINECTOMY Right 09/03/2014   Procedure: RIGHT SHOULDER ARTHROSCOPY WITH MINI OPEN ROTATOR CUFF TEAR;  Surgeon: Thornton Park, MD;  Location: [facility];  Service: Orthopedics;  Laterality: Right;  biceps tenodesis, arthroscopic subacromial decompression and distal clavicle incision   spinal injections     SUBACROMIAL DECOMPRESSION Left 10/14/2015   Procedure: SUBACROMIAL DECOMPRESSION;  Surgeon: Thornton Park, MD;  Location: [facility];  Service: Orthopedics;  Laterality: Left;    There were no vitals filed for this visit.   Subjective Assessment - 09/23/20 1539     Subjective L shoulder pain  recorded at a 2/10 NPS. Reports having a DEXA scan on L shoulder.    Pertinent History L shoulder pain. Pain starts from the L side of her neck which moves to her L shoulder. Pt also states having R mid low back pain. Also states having degenerative joint and disc disease. Goes to pain management. L shoulder pain began about 3 weeks ago, sudden onset, unknown mechanism of injury. Feels tight L posteror shoulder. Felt ok about 3 days ago but a day or two later, pain returned. Pt is mainly R hand dominant. Does not understand what is going on. L shoulder was worse after the surgery and does not understand it.    Currently in Pain? Yes    Pain Score 2     Pain Location Shoulder    Pain Orientation Left    Pain Type Acute pain    Pain Onset More than a month ago            Manual therapy: 12 min with pt in sititng   Seated STM L pectoralis muscle inferior to clavicle to decrease muscle tension   STM L lower trap muscle area, anterior shoulder and arm and lateral shoulder and arm to decrease fascial restrictions   Seated STM L cervical paraspinal muscles to decrease tension and fascial restrictions        Therapeutic exercise Brief 5 min spent updating and assessing long term goals. Pt accomplished L shoulder flexion AROM goal and is making progress towards FOTO goal. See clinical impression and goals section for details.   Seated alternating serratus ant punches with 2# DB's: 1x10/direction  Alternating straight ahead up to 90 deg flexion   Alternating across body to 90 deg    Alternating shoulder flexion across body  > 90 deg   Resisted scapular retraction with GTB, 3x10 in sitting  Resisted shoulder ER w/ towel b/t torso: RTB, 3x10   Standing low rows with RTB shoulder extension: 1x10    PT Education - 09/23/20 1650     Education provided Yes    Education Details POC going forward. There.ex    Person(s) Educated Patient    Methods Explanation;Demonstration;Tactile  cues;Verbal cues    Comprehension Verbalized understanding;Returned demonstration              PT Short Term Goals - 09/07/20 1335       PT SHORT TERM GOAL #1   Title Pt will be independent with her initial HEP to decrease pain, improve strength, ROM, function, and ability to reach more comfortably.    Baseline Does her HEP, no other questions (09/07/2020)    Time 3    Period Weeks    Status Achieved    Target Date 08/20/20  PT Long Term Goals - 09/23/20 1543       PT LONG TERM GOAL #1   Title Pt will have a decrease in L shoulder joint pain to 3/10 or less at worst to promote ability to raise her arm, reach, and carry items more comfortably.    Baseline 8/10 L shoulder pain at most for the past 2 weeks (07/29/2020); 3/10 at most for the past 7 days (09/07/2020)    Time 8    Period Weeks    Status Achieved      PT LONG TERM GOAL #2   Title Pt will have a decrease in L lateral neck pain to 3/10 or less at worst to promote ability to raise her arm, reach, and carry items more comfortably.    Baseline 8/10 L lateral neck pain at most for the past 2 weeks (07/29/2020); 2/10 at most for the past 7 days (09/07/2020)    Time 8    Period Weeks    Status Achieved      PT LONG TERM GOAL #3   Title Pt will improve L shoulder flexion AROM to 140 degrees or more to promote ability to raise her arm and reach more comfortably.    Baseline 112 degrees L shoulder flexion AROM with pain (07/29/2020); 135 degrees L shoulder flexion AROM (09/07/2020); 8/24: 152 deg L shoulder flexion AROM    Time 8    Period Weeks    Status Partially Met    Target Date 11/04/20      PT LONG TERM GOAL #4   Title Pt will improve L shoulder flexion, abduction, ER, IR strength by at least 1/2 MMT grade to promote ability so use her L UE for functional tasks more comfortably.    Baseline 4/5 L shoulder strength all planes (07/29/2020); 4+/5 all planes (09/07/2020)    Time 8    Period Weeks    Status  Achieved      PT LONG TERM GOAL #5   Title Pt will improve L shoulder FOTO score by at least 10 points as a demonstration of improved function.    Baseline L shoulder FOTO 53 (07/29/2020); 59 (08/31/2020); 8/24: 63/64    Time 8    Period Weeks    Status On-going    Target Date 11/04/20      Additional Long Term Goals   Additional Long Term Goals Yes      PT LONG TERM GOAL #6   Title Pt will report improved quality of sleep and reduction of radicular LUE symptoms when sleeping on L side to improve quality of sleep.    Baseline 8/24: Repotrs N/T down LUE when sleeping on L side.    Time 6    Period Weeks    Status New    Target Date 11/04/20                   Plan - 09/23/20 1651     Clinical Impression Statement Pt at current end of cert date. Updated goals and POC. Pt accomplished L shoulder AROM goal with ability to flex L shoulder to 153 deg with a goal of 140. Pt making progress in FOTO but still yet to accomplish (shy of 1 point). POC added an additional 6 weeks to further promote L shoulder strength and overhead mobility. Pt still displaying deficits in sleeping with LUE N/T symptoms when laying on shoulder PT can attempt to improve to improve quality of sleep. Pt will continue to  benefit from skilled intervention to improve the aformentioned deficits above to return to PLOF.    Personal Factors and Comorbidities Comorbidity 3+;Time since onset of injury/illness/exacerbation;Past/Current Experience    Comorbidities DDD, depression, fibromyalgia, anxiety, COPD, DDD, hx of breast CA S/P surgery, neck surgery    Examination-Activity Limitations [place]    Stability/Clinical Decision Making Stable/Uncomplicated    Clinical Decision Making Low    Rehab Potential Fair    Clinical Impairments Affecting Rehab Potential multiple co-morbidities    PT Frequency 2x / week    PT Duration 6 weeks    PT Treatment/Interventions Therapeutic activities;Therapeutic exercise;Neuromuscular  re-education;Patient/family education;Manual techniques;Dry needling;Electrical Stimulation;Iontophoresis 36m/ml Dexamethasone    PT Next Visit Plan Posture, scapular and shoulder strength, ROM, manual techniques, modalities PRN    PT Home Exercise Plan AROM supination/pronation in painfree range; L digit composite flexion and extension; Ball squeeze     Consulted and Agree with Plan of Care Patient             Patient will benefit from skilled therapeutic intervention in order to improve the following deficits and impairments:  Pain, Improper body mechanics, Decreased strength  Visit Diagnosis: Acute pain of left shoulder  Cervicalgia     Problem List Patient Active Problem List   Diagnosis Date Noted   Genetic testing 03/09/2017   Rotator cuff tear 02/07/2017   Shoulder pain 02/07/2017   Malignant neoplasm of upper-outer quadrant of left breast in female, estrogen receptor positive [place] 02/07/2017   History of hysterectomy 03/16/2016   Rotator cuff (capsule) sprain 10/15/2015   S/P rotator cuff repair 10/14/2015   Atypical squamous cells of undetermined significance on cytologic smear of cervix ([facility]) 05/13/2015   DDD (degenerative disc disease), cervical 07/21/2014   Status post cervical spinal fusion 07/21/2014   DDD (degenerative disc disease), thoracic 07/21/2014   DDD (degenerative disc disease), lumbar 07/21/2014   Facet syndrome, lumbar 07/21/2014   Sacroiliac joint dysfunction 07/21/2014   Occipital neuralgia 07/21/2014   DJD of shoulder 07/21/2014   Intractable migraine with aura without status migrainosus 02/27/2014   Bipolar disorder ([place] 02/17/2014   CAFL (chronic airflow limitation) ([place] 02/17/2014   Fibrositis 02/17/2014   Cephalalgia 02/17/2014   HLD (hyperlipidemia) 02/17/2014   Impaired renal function 02/17/2014   Restless leg 02/17/2014   Apnea, sleep 02/17/2014   Chronic pain associated with significant psychosocial dysfunction 11/28/2013     MSalem Caster Fairly IV, PT, DPT Physical Therapist- [facility] 09/23/2020, 5:29 PM  [facility] [address] [place] [id number]: [phone]  Fax:  [phone] Name: Briana MADDYMRN: [id number] of Birth: [birth year]

## 2020-09-29 DIAGNOSIS — Z872 Personal history of diseases of the skin and subcutaneous tissue: Secondary | ICD-10-CM | POA: Diagnosis not present

## 2020-09-29 DIAGNOSIS — Z86018 Personal history of other benign neoplasm: Secondary | ICD-10-CM | POA: Diagnosis not present

## 2020-09-29 DIAGNOSIS — L578 Other skin changes due to chronic exposure to nonionizing radiation: Secondary | ICD-10-CM | POA: Diagnosis not present

## 2020-09-30 ENCOUNTER — Ambulatory Visit: Payer: Medicare Other

## 2020-10-02 DIAGNOSIS — J449 Chronic obstructive pulmonary disease, unspecified: Secondary | ICD-10-CM | POA: Diagnosis not present

## 2020-10-07 ENCOUNTER — Ambulatory Visit: Payer: Medicare Other

## 2020-10-12 DIAGNOSIS — N182 Chronic kidney disease, stage 2 (mild): Secondary | ICD-10-CM | POA: Diagnosis not present

## 2020-10-12 DIAGNOSIS — N1831 Chronic kidney disease, stage 3a: Secondary | ICD-10-CM | POA: Diagnosis not present

## 2020-10-13 DIAGNOSIS — M259 Joint disorder, unspecified: Secondary | ICD-10-CM | POA: Diagnosis not present

## 2020-10-13 DIAGNOSIS — G894 Chronic pain syndrome: Secondary | ICD-10-CM | POA: Diagnosis not present

## 2020-10-13 DIAGNOSIS — M179 Osteoarthritis of knee, unspecified: Secondary | ICD-10-CM | POA: Diagnosis not present

## 2020-10-13 DIAGNOSIS — M5136 Other intervertebral disc degeneration, lumbar region: Secondary | ICD-10-CM | POA: Diagnosis not present

## 2020-10-15 ENCOUNTER — Ambulatory Visit: Payer: Medicare Other | Attending: Neurology

## 2020-10-15 DIAGNOSIS — M542 Cervicalgia: Secondary | ICD-10-CM | POA: Diagnosis not present

## 2020-10-15 DIAGNOSIS — M25512 Pain in left shoulder: Secondary | ICD-10-CM

## 2020-10-15 NOTE — Patient Instructions (Signed)
Access Code: [email or web address] URL: [email or web address] Date: 10/15/2020 Prepared by: Joneen Boers  Exercises Seated Scapular Retraction - 1 x daily - 7 x weekly - 3 sets - 10 reps - 5 seconds hold Scapular Retraction with Resistance - 1 x daily - 7 x weekly - 2 sets - 10 reps - 5 seconds hold Isometric Tricep Extension - 1 x daily - 7 x weekly - 3 sets - 10 reps - 5 seconds hold Seated Levator Scapulae Stretch - 3 x daily - 7 x weekly - 1 sets - 5 reps - 30 seconds hold Seated Shoulder Inferior Glide - 1 x daily - 7 x weekly - 3 sets - 10 reps - 5 seconds hold Single Arm Shoulder Extension with Anchored Resistance - 1 x daily - 7 x weekly - 3 sets - 10 reps Wall Push Up - 1 x daily - 7 x weekly - 3 sets - 10 reps

## 2020-10-15 NOTE — Therapy (Signed)
[facility] [address], [place], [address] Phone: [phone]   Fax:  [phone]  Physical Therapy Treatment And Discharge Summary  Patient Details  Name: Briana [name] MRN: [id number] Date of Birth: 10-28-1965 Referring Provider (PT): Gayland Curry, [place]   Encounter Date: 10/15/2020   PT End of Session - 10/15/20 1630     Visit Number 15    Number of Visits 17    Date for PT Re-Evaluation 09/24/20    Authorization Type UHC Medicare: [place]    Authorization Time Period Cert 02/19/39-7/40/81    PT Start Time 1630    PT Stop Time 1718    PT Time Calculation (min) 48 min    Activity Tolerance Patient tolerated treatment well;No increased pain    Behavior During Therapy [facility] for tasks assessed/performed             Past Medical History:  Diagnosis Date   Abnormal Pap smear of cervix    01/2015 ascus/neg- 04/2015 ascus/neg   Allergy    Anxiety    Arthritis    Asthma    Breast cancer ([place]) 2019   Chronic kidney disease    COPD (chronic obstructive pulmonary disease) (HCC)    DDD (degenerative disc disease), lumbar    Depression    Elevated lipids    Fibromyalgia    Fibromyalgia    Genetic testing 03/09/2017   Multi-Cancer panel (83 genes) @ [facility] - No pathogenic mutations detected   GERD (gastroesophageal reflux disease)    History of IBS    Hyperthyroidism    Joint disease    Migraine    Migraine    Oxygen deficiency    Personal history of chemotherapy    Personal history of radiation therapy    Restless leg syndrome    Sleep apnea    Does not use C-PAP, cannot tolerate mask    Past Surgical History:  Procedure Laterality Date   ABDOMINAL HYSTERECTOMY  2008   BREAST BIOPSY Left 02/02/2017   [place] core positive   BREAST LUMPECTOMY Left 02/17/2017   BREAST LUMPECTOMY WITH NEEDLE LOCALIZATION Left 02/17/2017   Procedure: BREAST LUMPECTOMY WITH NEEDLE LOCALIZATION;  Surgeon: Vickie Epley, MD;  Location: [facility];  Service: General;  Laterality: Left;   CARPAL TUNNEL RELEASE Bilateral    cryotherapy     DILATION AND CURETTAGE OF UTERUS     ENDOMETRIAL ABLATION     LAPAROSCOPIC OOPHERECTOMY Left    unsure which side but thinks its the left   LYSIS OF ADHESION Left 10/14/2015   Procedure: LYSIS OF ADHESION;  Surgeon: Thornton Park, MD;  Location: [facility];  Service: Orthopedics;  Laterality: Left;   NECK SURGERY     lower neck fusion rods and screws   OOPHORECTOMY     one ovary removed    PORTA CATH INSERTION N/A 03/08/2017   Procedure: PORTA CATH INSERTION;  Surgeon: Katha Cabal, MD;  Location: [facility];  Service: Cardiovascular;  Laterality: N/A;   RESECTION DISTAL CLAVICAL Left 10/14/2015   Procedure: RESECTION DISTAL CLAVICAL;  Surgeon: Thornton Park, MD;  Location: [facility];  Service: Orthopedics;  Laterality: Left;   SENTINEL NODE BIOPSY Left 02/17/2017   Procedure: SENTINEL NODE BIOPSY;  Surgeon: Vickie Epley, MD;  Location: [facility];  Service: General;  Laterality: Left;   SHOULDER ARTHROSCOPY WITH OPEN ROTATOR CUFF REPAIR Left 10/14/2015   Procedure: SHOULDER ARTHROSCOPY WITH OPEN  ROTATOR CUFF REPAIR;  Surgeon: Thornton Park, MD;  Location: [facility];  Service: Orthopedics;  Laterality: Left;   SHOULDER ARTHROSCOPY WITH OPEN ROTATOR CUFF REPAIR AND DISTAL CLAVICLE ACROMINECTOMY Right 09/03/2014   Procedure: RIGHT SHOULDER ARTHROSCOPY WITH MINI OPEN ROTATOR CUFF TEAR;  Surgeon: Thornton Park, MD;  Location: [facility];  Service: Orthopedics;  Laterality: Right;  biceps tenodesis, arthroscopic subacromial decompression and distal clavicle incision   spinal injections     SUBACROMIAL DECOMPRESSION Left 10/14/2015   Procedure: SUBACROMIAL DECOMPRESSION;  Surgeon: Thornton Park, MD;  Location: [facility];  Service: Orthopedics;  Laterality: Left;    There were no vitals filed for this visit.   Subjective Assessment - 10/15/20 1631      Subjective L shoulder is doing alright. 3/10 currently. 5/10 at most for the past 7 days. Reaching behind her head is better.    Pertinent History L shoulder pain. Pain starts from the L side of her neck which moves to her L shoulder. Pt also states having R mid low back pain. Also states having degenerative joint and disc disease. Goes to pain management. L shoulder pain began about 3 weeks ago, sudden onset, unknown mechanism of injury. Feels tight L posteror shoulder. Felt ok about 3 days ago but a day or two later, pain returned. Pt is mainly R hand dominant. Does not understand what is going on. L shoulder was worse after the surgery and does not understand it.    Currently in Pain? Yes    Pain Score 3     Pain Onset More than a month ago                                        PT Education - 10/15/20 1728     Education provided Yes    Education Details ther-ex, HEP    Person(s) Educated Patient    Methods Explanation;Demonstration;Tactile cues;Verbal cues;Handout    Comprehension Returned demonstration;Verbalized understanding              Objective   Latex allergies   Nothing tight for L arm (such as blood pressure cuff) secondary breast CA and surgery.    Hx of B rotator cuff surgery and disc repair in neck.    [employer]   Muscle tension L upper trap   [employer] Access Code [email or web address]     Manual therapy   Seated STM L pectoralis muscle inferior to clavicle to decrease muscle tension   STM L lower trap muscle area, anterior shoulder and arm and lateral shoulder and arm to decrease fascial restrictions     Therapeutic exercise    Wall push up  10x3  Manually resisted scapular retraction targeting lower trap             L 10x5 seconds for 3 sets   L triceps extension red band 10x3  Seated manually resisted L elbow extension isometrics 10x3 with 5 second holds   Seated manually resisted scapular depression isometrics              L 10x5 seconds for 3 sets     Standing L shoulder extension with scapular retraction red band 10x3   Reviewed progress with PT towards pt goals. Pt states she feels like she can graduate and continue with her HEP after this visit.       Improved exercise technique, movement at target joints, use of  target muscles after min to mod verbal, visual, tactile cues.        Response to treatment Pt tolerated session well without aggravation of symptoms.     Clinical impression Pt demonstrates overall decreased pain, improve strength  L shoulder AROM and function since initial evaluation. Pt has made very good progress with PT towards goals. Skilled physical therapy services discharged with pt continuing with her HEP.        PT Short Term Goals - 09/07/20 1335       PT SHORT TERM GOAL #1   Title Pt will be independent with her initial HEP to decrease pain, improve strength, ROM, function, and ability to reach more comfortably.    Baseline Does her HEP, no other questions (09/07/2020)    Time 3    Period Weeks    Status Achieved    Target Date 08/20/20               PT Long Term Goals - 10/15/20 1713       PT LONG TERM GOAL #1   Title Pt will have a decrease in L shoulder joint pain to 3/10 or less at worst to promote ability to raise her arm, reach, and carry items more comfortably.    Baseline 8/10 L shoulder pain at most for the past 2 weeks (07/29/2020); 3/10 at most for the past 7 days (09/07/2020)    Time 8    Period Weeks    Status Achieved      PT LONG TERM GOAL #2   Title Pt will have a decrease in L lateral neck pain to 3/10 or less at worst to promote ability to raise her arm, reach, and carry items more comfortably.    Baseline 8/10 L lateral neck pain at most for the past 2 weeks (07/29/2020); 2/10 at most for the past 7 days (09/07/2020)    Time 8    Period Weeks    Status Achieved      PT LONG TERM GOAL #3   Title Pt will improve L shoulder flexion AROM to  140 degrees or more to promote ability to raise her arm and reach more comfortably.    Baseline 112 degrees L shoulder flexion AROM with pain (07/29/2020); 135 degrees L shoulder flexion AROM (09/07/2020); 8/24: 152 deg L shoulder flexion AROM    Time 8    Period Weeks    Status Achieved      PT LONG TERM GOAL #4   Title Pt will improve L shoulder flexion, abduction, ER, IR strength by at least 1/2 MMT grade to promote ability so use her L UE for functional tasks more comfortably.    Baseline 4/5 L shoulder strength all planes (07/29/2020); 4+/5 all planes (09/07/2020)    Time 8    Period Weeks    Status Achieved      PT LONG TERM GOAL #5   Title Pt will improve L shoulder FOTO score by at least 10 points as a demonstration of improved function.    Baseline L shoulder FOTO 53 (07/29/2020); 59 (08/31/2020); 8/24: 63/64;  66/64 (10/15/2020)    Time 8    Period Weeks    Status Achieved    Target Date 11/04/20      PT LONG TERM GOAL #6   Title Pt will report improved quality of sleep and reduction of radicular LUE symptoms when sleeping on L side to improve quality of sleep.    Baseline 8/24: Repotrs  N/T down LUE when sleeping on L side.; Some improvement (10/15/2020)    Time 6    Period Weeks    Status Partially Met    Target Date 11/04/20                   Plan - 10/15/20 1721     Clinical Impression Statement Pt demonstrates overall decreased pain, improve strength  L shoulder AROM and function since initial evaluation. Pt has made very good progress with PT towards goals. Skilled physical therapy services discharged with pt continuing with her HEP.    Personal Factors and Comorbidities Comorbidity 3+;Time since onset of injury/illness/exacerbation;Past/Current Experience    Comorbidities DDD, depression, fibromyalgia, anxiety, COPD, DDD, hx of breast CA S/P surgery, neck surgery    Examination-Activity Limitations [place]    Stability/Clinical Decision Making Stable/Uncomplicated     Rehab Potential --    Clinical Impairments Affecting Rehab Potential multiple co-morbidities    PT Frequency --    PT Duration --    PT Treatment/Interventions Therapeutic activities;Therapeutic exercise;Neuromuscular re-education;Patient/family education;Manual techniques    PT Next Visit Plan Continue progress with her exercises at home    PT [place] Access Code [email or web address]    Consulted and Agree with Plan of Care Patient             Patient will benefit from skilled therapeutic intervention in order to improve the following deficits and impairments:  Pain, Improper body mechanics, Decreased strength  Visit Diagnosis: Cervicalgia  Acute pain of left shoulder     Problem List Patient Active Problem List   Diagnosis Date Noted   Genetic testing 03/09/2017   Rotator cuff tear 02/07/2017   Shoulder pain 02/07/2017   Malignant neoplasm of upper-outer quadrant of left breast in female, estrogen receptor positive ([place]) 02/07/2017   History of hysterectomy 03/16/2016   Rotator cuff (capsule) sprain 10/15/2015   S/P rotator cuff repair 10/14/2015   Atypical squamous cells of undetermined significance on cytologic smear of cervix (ASC-US) 05/13/2015   DDD (degenerative disc disease), cervical 07/21/2014   Status post cervical spinal fusion 07/21/2014   DDD (degenerative disc disease), thoracic 07/21/2014   DDD (degenerative disc disease), lumbar 07/21/2014   Facet syndrome, lumbar 07/21/2014   Sacroiliac joint dysfunction 07/21/2014   Occipital neuralgia 07/21/2014   DJD of shoulder 07/21/2014   Intractable migraine with aura without status migrainosus 02/27/2014   Bipolar disorder ([place]) 02/17/2014   CAFL (chronic airflow limitation) ([place]) 02/17/2014   Fibrositis 02/17/2014   Cephalalgia 02/17/2014   HLD (hyperlipidemia) 02/17/2014   Impaired renal function 02/17/2014   Restless leg 02/17/2014   Apnea, sleep 02/17/2014   Chronic pain associated with  significant psychosocial dysfunction 11/28/2013    Thank you for your referral.   Joneen Boers PT, DPT   10/15/2020, 5:29 PM  [facility] [address], [place], [address] Phone: [phone]   Fax:  [phone]  Name: DINNA SEVERS MRN: [id number] Date of Birth: Nov 30, 1965

## 2020-10-28 DIAGNOSIS — D509 Iron deficiency anemia, unspecified: Secondary | ICD-10-CM | POA: Diagnosis not present

## 2020-10-28 DIAGNOSIS — R5382 Chronic fatigue, unspecified: Secondary | ICD-10-CM | POA: Diagnosis not present

## 2020-10-28 DIAGNOSIS — R7301 Impaired fasting glucose: Secondary | ICD-10-CM | POA: Diagnosis not present

## 2020-10-28 DIAGNOSIS — I10 Essential (primary) hypertension: Secondary | ICD-10-CM | POA: Diagnosis not present

## 2020-10-28 DIAGNOSIS — E785 Hyperlipidemia, unspecified: Secondary | ICD-10-CM | POA: Diagnosis not present

## 2020-10-29 NOTE — Progress Notes (Signed)
[place]  [phone]) [phone] [phone]  ID: Briana [name] OB: 1966-01-05  MR#: [id number]  BZJ#:[id number]  Patient Care Team: [name], MD as PCP - General (Internal Medicine) Rico Junker, RN as Oncology Nurse Navigator Grayland Ormond, Kathlene November, MD as Consulting Physician (Oncology) Vickie Epley, MD (Inactive) as Consulting Physician (General Surgery) Noreene Filbert, MD as Referring Physician (Radiation Oncology) Teodoro Spray, MD as Consulting Physician (Cardiology)  CHIEF COMPLAINT: Clinical stage Ia ER/PR positive, HER-2 over-expressing invasive carcinoma of the upper outer quadrant of the left breast.  INTERVAL HISTORY: Patient is 55 year old female who returns to clinic for continued surveillance of breast cancer and to follow up for use of anastrozole. She feels well and denies specific complaints. Denies any neurologic complaints. Denies recent fevers or illnesses. Denies any easy bleeding or bruising. No melena or hematochezia. No pica or restless leg. Reports good appetite and denies weight loss. Denies chest pain. Denies any nausea, vomiting, constipation, or diarrhea. Denies urinary complaints. Patient offers no further specific complaints today.  REVIEW OF SYSTEMS:   Review of Systems  Constitutional: Negative.  Negative for fever, malaise/fatigue and weight loss.  Respiratory: Negative.  Negative for cough, hemoptysis and shortness of breath.   Cardiovascular: Negative.  Negative for chest pain, palpitations and leg swelling.  Gastrointestinal: Negative.  Negative for abdominal pain.  Genitourinary: Negative.  Negative for dysuria.  Musculoskeletal: Negative.  Negative for joint pain.  Skin: Negative.  Negative for itching and rash.  Neurological: Negative.  Negative for dizziness, sensory change, focal weakness, loss of consciousness, weakness and headaches.  Psychiatric/Behavioral: Negative.  The patient is not  nervous/anxious and does not have insomnia.   Breast: No new lumps, bumps, or skin changes.    PAST MEDICAL HISTORY: Past Medical History:  Diagnosis Date   Abnormal Pap smear of cervix    01/2015 ascus/neg- 04/2015 ascus/neg   Allergy    Anxiety    Arthritis    Asthma    Breast cancer ([place]) 2019   Chronic kidney disease    COPD (chronic obstructive pulmonary disease) (HCC)    DDD (degenerative disc disease), lumbar    Depression    Elevated lipids    Fibromyalgia    Fibromyalgia    Genetic testing 03/09/2017   Multi-Cancer panel (83 genes) @ [facility] - No pathogenic mutations detected   GERD (gastroesophageal reflux disease)    History of IBS    Hyperthyroidism    Joint disease    Migraine    Migraine    Oxygen deficiency    Personal history of chemotherapy    Personal history of radiation therapy    Restless leg syndrome    Sleep apnea    Does not use C-PAP, cannot tolerate mask    PAST SURGICAL HISTORY: Past Surgical History:  Procedure Laterality Date   ABDOMINAL HYSTERECTOMY  2008   BREAST BIOPSY Left 02/02/2017   [place] core positive   BREAST LUMPECTOMY Left 02/17/2017   BREAST LUMPECTOMY WITH NEEDLE LOCALIZATION Left 02/17/2017   Procedure: BREAST LUMPECTOMY WITH NEEDLE LOCALIZATION;  Surgeon: Vickie Epley, MD;  Location: [facility];  Service: General;  Laterality: Left;   CARPAL TUNNEL RELEASE Bilateral    cryotherapy     DILATION AND CURETTAGE OF UTERUS     ENDOMETRIAL ABLATION     LAPAROSCOPIC OOPHERECTOMY Left    unsure which side but thinks its the left   LYSIS OF ADHESION Left 10/14/2015   Procedure:  LYSIS OF ADHESION;  Surgeon: Thornton Park, MD;  Location: [facility];  Service: Orthopedics;  Laterality: Left;   NECK SURGERY     lower neck fusion rods and screws   OOPHORECTOMY     one ovary removed    PORTA CATH INSERTION N/A 03/08/2017   Procedure: PORTA CATH INSERTION;  Surgeon: Katha Cabal, MD;  Location: [facility];  Service:  Cardiovascular;  Laterality: N/A;   RESECTION DISTAL CLAVICAL Left 10/14/2015   Procedure: RESECTION DISTAL CLAVICAL;  Surgeon: Thornton Park, MD;  Location: [facility];  Service: Orthopedics;  Laterality: Left;   SENTINEL NODE BIOPSY Left 02/17/2017   Procedure: SENTINEL NODE BIOPSY;  Surgeon: Vickie Epley, MD;  Location: [facility];  Service: General;  Laterality: Left;   SHOULDER ARTHROSCOPY WITH OPEN ROTATOR CUFF REPAIR Left 10/14/2015   Procedure: SHOULDER ARTHROSCOPY WITH OPEN ROTATOR CUFF REPAIR;  Surgeon: Thornton Park, MD;  Location: [facility];  Service: Orthopedics;  Laterality: Left;   SHOULDER ARTHROSCOPY WITH OPEN ROTATOR CUFF REPAIR AND DISTAL CLAVICLE ACROMINECTOMY Right 09/03/2014   Procedure: RIGHT SHOULDER ARTHROSCOPY WITH MINI OPEN ROTATOR CUFF TEAR;  Surgeon: Thornton Park, MD;  Location: [facility];  Service: Orthopedics;  Laterality: Right;  biceps tenodesis, arthroscopic subacromial decompression and distal clavicle incision   spinal injections     SUBACROMIAL DECOMPRESSION Left 10/14/2015   Procedure: SUBACROMIAL DECOMPRESSION;  Surgeon: Thornton Park, MD;  Location: [facility];  Service: Orthopedics;  Laterality: Left;    FAMILY HISTORY: Family History  Problem Relation Age of Onset   Breast cancer Mother 44       Glioblastoma also; deceased at 13   Diabetes Father    Lung cancer Father        mesothelioma; deceased 81s   Cancer Maternal Aunt        "bone ca"; unk. primary   Colon cancer Maternal Grandfather        dx in 69s; deceased 65   Colon cancer Maternal Aunt        dx in 70s; currently 72s   Lung cancer Maternal Grandmother        smoker; deceased 38   Lung cancer Paternal Grandmother        smoker; deceased 62s   Breast cancer Cousin        dx 13s; daughter of mat aunt with unk. primary cancer   Cancer Other        distant cousin; unknown primary   Colon cancer Other        dx 76s; currently 42; maternal half-sister   Bipolar disorder Sister     Schizophrenia Sister     ADVANCED DIRECTIVES (Y/N):  N  HEALTH MAINTENANCE: Social History   Tobacco Use   Smoking status: Every Day    Packs/day: 1.00    Types: Cigarettes   Smokeless tobacco: Never  Vaping Use   Vaping Use: Never used  Substance Use Topics   Alcohol use: No    Alcohol/week: 0.0 standard drinks   Drug use: No    Colonoscopy:  PAP:  Bone density:  Lipid panel:  Allergies  Allergen Reactions   Iodine    Bee Venom Hives and Rash   Cefoxitin Rash   Cefuroxime Axetil Hives and Rash   Cucumber Extract Rash   Gadolinium Derivatives Swelling, Other (See Comments) and Rash    NECK BECAME RED AND TONGUE WAS SWELLING SLIGHTLY PER PT NECK BECAME RED AND TONGUE WAS SWELLING SLIGHTLY PER PT NECK BECAME  RED AND TONGUE WAS SWELLING SLIGHTLY PER PT   Iodinated Diagnostic Agents Hives and Rash   Latex Rash   Other Hives and Rash    Bee Stings-swelling/hives/rash Wool (textile fiber)-Rash/itching   Tomato Rash    Red tomatoes    Current Outpatient Medications  Medication Sig Dispense Refill   anastrozole (ARIMIDEX) 1 MG tablet Take 1 tablet (1 mg total) by mouth daily. 90 tablet 3   baclofen (LIORESAL) 10 MG tablet Take 10 mg by mouth 3 (three) times daily as needed for muscle spasms (typically 2-3 times daily).      calcium carbonate (OS-CAL) 600 MG TABS tablet Take by mouth.     cholecalciferol (VITAMIN D) 1000 UNITS tablet Take 1,000 Units by mouth 2 (two) times daily.      EPINEPHrine (EPIPEN JR) 0.15 MG/0.3ML injection Inject 0.15 mg into the muscle as needed for anaphylaxis.     Multiple Vitamins-Minerals (HAIR SKIN AND NAILS FORMULA) TABS Take 2 tablets by mouth daily.     NARCAN 4 MG/0.1ML LIQD nasal spray kit CALL 911. ADMINISTER A SINGLE SPRAY OF NARCAN IN ONE NOSTRIL. REPEAT EVERY 3 MINUTES AS NEEDED IF NO OR MINIMAL RESPONSE.     OXYCODONE HCL PO Take 15 mg by mouth as needed. Take 4-6 times a day as needed.     pregabalin (LYRICA) 25 MG capsule Take  1 capsule by mouth 2 (two) times daily.     promethazine (PHENERGAN) 25 MG tablet Take 25 mg by mouth every 6 (six) hours as needed for nausea or vomiting.     rizatriptan (MAXALT-MLT) 10 MG disintegrating tablet Take 10 mg by mouth as needed for migraine. May repeat in 2 hours if needed     rOPINIRole (REQUIP) 1 MG tablet Take 1.5 mg by mouth at bedtime.     rosuvastatin (CRESTOR) 40 MG tablet Take 40 mg by mouth at bedtime.      topiramate (TOPAMAX) 100 MG tablet Take 200 mg by mouth daily.      traZODone (DESYREL) 50 MG tablet Take 50 mg by mouth at bedtime.     vitamin E 200 UNIT capsule Take 200 Units by mouth 4 (four) times a week. Take 4-5 times a week     hydrocortisone 2.5 % cream Apply 1 application topically 2 (two) times daily as needed (for itchy/dry skin.). (Patient not taking: Reported on 10/30/2020)     No current facility-administered medications for this visit.   Facility-Administered Medications Ordered in Other Visits  Medication Dose Route Frequency Provider Last Rate Last Admin   heparin lock flush 100 unit/mL  500 Units Intravenous Once Lloyd Huger, MD       sodium chloride flush (NS) 0.9 % injection 10 mL  10 mL Intravenous PRN Lloyd Huger, MD   10 mL at 08/21/17 1057    OBJECTIVE: Vitals:   10/30/20 1059  BP: 108/79  Pulse: 100  Resp: 18  Temp: 98.2 F (36.8 C)  SpO2: 99%     Body mass index is 29.95 kg/m.    ECOG FS:0 - Asymptomatic  General: Well-developed, well-nourished, no acute distress. Eyes: Pink conjunctiva, anicteric sclera. Lungs: Clear to auscultation bilaterally.  No audible wheezing or coughing Heart: Regular rate and rhythm.  Abdomen: Soft, nontender, nondistended.  Musculoskeletal: No edema, cyanosis, or clubbing. Neuro: Alert, answering all questions appropriately. Cranial nerves grossly intact. Skin: No rashes or petechiae noted. Psych: Normal affect. Breast: declined   LAB RESULTS:  Lab Results  Component  Value  Date   NA 141 09/11/2017   K 3.8 09/11/2017   CL 109 09/11/2017   CO2 21 (L) 09/11/2017   GLUCOSE 84 09/11/2017   BUN 25 (H) 09/11/2017   CREATININE 1.04 (H) 09/11/2017   CALCIUM 9.5 09/11/2017   PROT 7.5 09/11/2017   ALBUMIN 4.0 09/11/2017   AST 22 09/11/2017   ALT 16 09/11/2017   ALKPHOS 80 09/11/2017   BILITOT 0.3 09/11/2017   GFRNONAA >60 09/11/2017   GFRAA >60 09/11/2017    Lab Results  Component Value Date   WBC 8.3 09/11/2017   NEUTROABS 6.2 09/11/2017   HGB 14.1 09/11/2017   HCT 40.8 09/11/2017   MCV 98.6 09/11/2017   PLT 261 09/11/2017    STUDIES: No results found.   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  PLAN:    1. Stage Ia ER/PR positive, HER-2 over expressing invasive carcinoma of upper outer quadrant of left breast- s/p lumpectomy 02/17/17 followed by adjuvant chemotherapy with taxol and herceptin 03/13/17. She completed taxol 05/29/17. She received 4 cycles of herceptin maintenance which was permanently discontinued on 08/21/17 secondary to persistently low EF on MUGA scan. Letrozole was discontinued secondary to hot flashes and she transitioned to anastrozole. Plan for 5 years of adjuvant hormonal therapy given ER/PR positivity of her tumor. Continue AI through at least July 2024 but given her high risk disease may consider extended adjuvant treatment. Last mammogram from 04/23/20 was bi-rads 2. Plan to repeat annually. Clinically, NED. Continue surveillance visits every 6 months per NCCN guidelines.  2.  Genetic testing: Negative.  No further interventions are needed. 3.  Joint pain: Does not complain of this today.  4.  Anxiety: managed by psychiatry.  5. Chronic Pain- managed by pain clinic 6. Reduced Ejection Fraction- herceptin has been permanently discontinued. Managed by Dr. Ubaldo Glassing, cardiology.  7. Osteopenia- bone density scan on 09/21/20 revealed T score of  -1.9, stable compared to previous. Continue calcium, vitamin d, and exercise as tolerated. Plan to repeat bone density scan in August 2024.  8. Dizziness- per cardiology; vasovagal. Follow up with cardiology.  9. Sweating- Follow up with PCP.   Plan for mammogram around March 24 then day to week later see Dr. Grayland Ormond for surveillance.   Patient expressed understanding and was in agreement with this plan. She also understands that She can call clinic at any time with any questions, concerns, or complaints.   Cancer Staging Malignant neoplasm of upper-outer quadrant of left breast in female, estrogen receptor positive ([place]) Staging form: Breast, AJCC 8th Edition - Clinical stage from 02/07/2017: Stage IA (cT1c, cN0, cM0, G1, ER+, PR+, HER2+) - Signed by Lloyd Huger, MD on 02/11/2017 Neoadjuvant therapy: No Histologic grading system: 3 grade system Laterality: Left   Briana Au, NP   10/30/2020

## 2020-10-30 ENCOUNTER — Inpatient Hospital Stay: Payer: Medicare Other | Attending: Oncology | Admitting: Nurse Practitioner

## 2020-10-30 VITALS — BP 108/79 | HR 100 | Temp 98.2°F | Resp 18 | Wt 169.1 lb

## 2020-10-30 DIAGNOSIS — F419 Anxiety disorder, unspecified: Secondary | ICD-10-CM | POA: Insufficient documentation

## 2020-10-30 DIAGNOSIS — M8580 Other specified disorders of bone density and structure, unspecified site: Secondary | ICD-10-CM | POA: Diagnosis not present

## 2020-10-30 DIAGNOSIS — Z923 Personal history of irradiation: Secondary | ICD-10-CM | POA: Insufficient documentation

## 2020-10-30 DIAGNOSIS — Z79899 Other long term (current) drug therapy: Secondary | ICD-10-CM | POA: Insufficient documentation

## 2020-10-30 DIAGNOSIS — Z170 Estrogen receptor positive status [ER+]: Secondary | ICD-10-CM | POA: Diagnosis not present

## 2020-10-30 DIAGNOSIS — C50412 Malignant neoplasm of upper-outer quadrant of left female breast: Secondary | ICD-10-CM

## 2020-10-30 DIAGNOSIS — Z9221 Personal history of antineoplastic chemotherapy: Secondary | ICD-10-CM | POA: Diagnosis not present

## 2020-10-30 DIAGNOSIS — Z08 Encounter for follow-up examination after completed treatment for malignant neoplasm: Secondary | ICD-10-CM

## 2020-10-30 DIAGNOSIS — Z853 Personal history of malignant neoplasm of breast: Secondary | ICD-10-CM

## 2020-10-30 DIAGNOSIS — Z79811 Long term (current) use of aromatase inhibitors: Secondary | ICD-10-CM | POA: Diagnosis not present

## 2020-10-30 NOTE — Progress Notes (Signed)
Pt endorses decreased movement and "weakness" in left side after completing physical therapy despite continuing exercises at home. Pt reports lack of energy since chemo/radiation treatment. Pt also wants to discuss bone density scan results

## 2020-11-01 DIAGNOSIS — J449 Chronic obstructive pulmonary disease, unspecified: Secondary | ICD-10-CM | POA: Diagnosis not present

## 2020-11-02 DIAGNOSIS — R7303 Prediabetes: Secondary | ICD-10-CM | POA: Diagnosis not present

## 2020-11-02 DIAGNOSIS — G4700 Insomnia, unspecified: Secondary | ICD-10-CM | POA: Diagnosis not present

## 2020-11-02 DIAGNOSIS — I10 Essential (primary) hypertension: Secondary | ICD-10-CM | POA: Diagnosis not present

## 2020-11-02 DIAGNOSIS — D509 Iron deficiency anemia, unspecified: Secondary | ICD-10-CM | POA: Diagnosis not present

## 2020-11-02 DIAGNOSIS — G8929 Other chronic pain: Secondary | ICD-10-CM | POA: Diagnosis not present

## 2020-11-02 DIAGNOSIS — G43109 Migraine with aura, not intractable, without status migrainosus: Secondary | ICD-10-CM | POA: Diagnosis not present

## 2020-11-02 DIAGNOSIS — E785 Hyperlipidemia, unspecified: Secondary | ICD-10-CM | POA: Diagnosis not present

## 2020-11-10 DIAGNOSIS — M259 Joint disorder, unspecified: Secondary | ICD-10-CM | POA: Diagnosis not present

## 2020-11-10 DIAGNOSIS — G894 Chronic pain syndrome: Secondary | ICD-10-CM | POA: Diagnosis not present

## 2020-11-10 DIAGNOSIS — M179 Osteoarthritis of knee, unspecified: Secondary | ICD-10-CM | POA: Diagnosis not present

## 2020-11-10 DIAGNOSIS — M5030 Other cervical disc degeneration, unspecified cervical region: Secondary | ICD-10-CM | POA: Diagnosis not present

## 2020-12-02 DIAGNOSIS — J449 Chronic obstructive pulmonary disease, unspecified: Secondary | ICD-10-CM | POA: Diagnosis not present

## 2020-12-08 DIAGNOSIS — M179 Osteoarthritis of knee, unspecified: Secondary | ICD-10-CM | POA: Diagnosis not present

## 2020-12-08 DIAGNOSIS — M5136 Other intervertebral disc degeneration, lumbar region: Secondary | ICD-10-CM | POA: Diagnosis not present

## 2020-12-08 DIAGNOSIS — G894 Chronic pain syndrome: Secondary | ICD-10-CM | POA: Diagnosis not present

## 2020-12-08 DIAGNOSIS — M5030 Other cervical disc degeneration, unspecified cervical region: Secondary | ICD-10-CM | POA: Diagnosis not present

## 2021-01-01 DIAGNOSIS — J449 Chronic obstructive pulmonary disease, unspecified: Secondary | ICD-10-CM | POA: Diagnosis not present

## 2021-01-05 DIAGNOSIS — M47897 Other spondylosis, lumbosacral region: Secondary | ICD-10-CM | POA: Diagnosis not present

## 2021-01-05 DIAGNOSIS — M792 Neuralgia and neuritis, unspecified: Secondary | ICD-10-CM | POA: Diagnosis not present

## 2021-01-05 DIAGNOSIS — G8929 Other chronic pain: Secondary | ICD-10-CM | POA: Diagnosis not present

## 2021-01-05 DIAGNOSIS — G894 Chronic pain syndrome: Secondary | ICD-10-CM | POA: Diagnosis not present

## 2021-01-05 DIAGNOSIS — M25559 Pain in unspecified hip: Secondary | ICD-10-CM | POA: Diagnosis not present

## 2021-01-05 DIAGNOSIS — M9951 Intervertebral disc stenosis of neural canal of cervical region: Secondary | ICD-10-CM | POA: Diagnosis not present

## 2021-01-05 DIAGNOSIS — M48062 Spinal stenosis, lumbar region with neurogenic claudication: Secondary | ICD-10-CM | POA: Diagnosis not present

## 2021-01-05 DIAGNOSIS — G43001 Migraine without aura, not intractable, with status migrainosus: Secondary | ICD-10-CM | POA: Diagnosis not present

## 2021-01-05 DIAGNOSIS — G4700 Insomnia, unspecified: Secondary | ICD-10-CM | POA: Diagnosis not present

## 2021-01-05 DIAGNOSIS — E0840 Diabetes mellitus due to underlying condition with diabetic neuropathy, unspecified: Secondary | ICD-10-CM | POA: Diagnosis not present

## 2021-01-05 DIAGNOSIS — R519 Headache, unspecified: Secondary | ICD-10-CM | POA: Diagnosis not present

## 2021-01-31 DIAGNOSIS — Z95818 Presence of other cardiac implants and grafts: Secondary | ICD-10-CM

## 2021-01-31 HISTORY — DX: Presence of other cardiac implants and grafts: Z95.818

## 2021-02-01 DIAGNOSIS — J449 Chronic obstructive pulmonary disease, unspecified: Secondary | ICD-10-CM | POA: Diagnosis not present

## 2021-02-02 DIAGNOSIS — G894 Chronic pain syndrome: Secondary | ICD-10-CM | POA: Diagnosis not present

## 2021-02-03 ENCOUNTER — Other Ambulatory Visit: Payer: Self-pay | Admitting: Student

## 2021-02-03 ENCOUNTER — Other Ambulatory Visit (HOSPITAL_COMMUNITY): Payer: Self-pay | Admitting: Student

## 2021-02-03 DIAGNOSIS — H539 Unspecified visual disturbance: Secondary | ICD-10-CM | POA: Diagnosis not present

## 2021-02-03 DIAGNOSIS — G43119 Migraine with aura, intractable, without status migrainosus: Secondary | ICD-10-CM | POA: Diagnosis not present

## 2021-02-03 DIAGNOSIS — G2581 Restless legs syndrome: Secondary | ICD-10-CM | POA: Diagnosis not present

## 2021-02-03 DIAGNOSIS — R252 Cramp and spasm: Secondary | ICD-10-CM | POA: Diagnosis not present

## 2021-02-10 ENCOUNTER — Other Ambulatory Visit: Payer: Medicare Other

## 2021-02-18 ENCOUNTER — Ambulatory Visit
Admission: RE | Admit: 2021-02-18 | Discharge: 2021-02-18 | Disposition: A | Discharge status: Home / Self Care | Payer: Medicare Other | Source: Ambulatory Visit | Attending: Student | Admitting: Student

## 2021-02-18 ENCOUNTER — Other Ambulatory Visit: Payer: Self-pay

## 2021-02-18 DIAGNOSIS — K589 Irritable bowel syndrome without diarrhea: Secondary | ICD-10-CM | POA: Diagnosis not present

## 2021-02-18 DIAGNOSIS — M79605 Pain in left leg: Secondary | ICD-10-CM | POA: Diagnosis not present

## 2021-02-18 DIAGNOSIS — H539 Unspecified visual disturbance: Secondary | ICD-10-CM

## 2021-02-18 DIAGNOSIS — N189 Chronic kidney disease, unspecified: Secondary | ICD-10-CM | POA: Diagnosis not present

## 2021-02-18 DIAGNOSIS — Z20822 Contact with and (suspected) exposure to covid-19: Secondary | ICD-10-CM | POA: Diagnosis not present

## 2021-02-18 DIAGNOSIS — G8191 Hemiplegia, unspecified affecting right dominant side: Secondary | ICD-10-CM | POA: Diagnosis not present

## 2021-02-18 DIAGNOSIS — I10 Essential (primary) hypertension: Secondary | ICD-10-CM | POA: Diagnosis not present

## 2021-02-18 DIAGNOSIS — M797 Fibromyalgia: Secondary | ICD-10-CM | POA: Diagnosis not present

## 2021-02-18 DIAGNOSIS — I639 Cerebral infarction, unspecified: Secondary | ICD-10-CM | POA: Diagnosis not present

## 2021-02-18 DIAGNOSIS — I6359 Cerebral infarction due to unspecified occlusion or stenosis of other cerebral artery: Secondary | ICD-10-CM | POA: Diagnosis not present

## 2021-02-18 DIAGNOSIS — R29898 Other symptoms and signs involving the musculoskeletal system: Secondary | ICD-10-CM | POA: Diagnosis not present

## 2021-02-18 DIAGNOSIS — Z853 Personal history of malignant neoplasm of breast: Secondary | ICD-10-CM | POA: Diagnosis not present

## 2021-02-18 DIAGNOSIS — Z91018 Allergy to other foods: Secondary | ICD-10-CM | POA: Diagnosis not present

## 2021-02-18 DIAGNOSIS — Z888 Allergy status to other drugs, medicaments and biological substances status: Secondary | ICD-10-CM | POA: Diagnosis not present

## 2021-02-18 DIAGNOSIS — I253 Aneurysm of heart: Secondary | ICD-10-CM | POA: Diagnosis not present

## 2021-02-18 DIAGNOSIS — G453 Amaurosis fugax: Secondary | ICD-10-CM | POA: Diagnosis not present

## 2021-02-18 DIAGNOSIS — Z91041 Radiographic dye allergy status: Secondary | ICD-10-CM | POA: Diagnosis not present

## 2021-02-18 DIAGNOSIS — Z91030 Bee allergy status: Secondary | ICD-10-CM | POA: Diagnosis not present

## 2021-02-18 DIAGNOSIS — Z91040 Latex allergy status: Secondary | ICD-10-CM | POA: Diagnosis not present

## 2021-02-18 DIAGNOSIS — R531 Weakness: Secondary | ICD-10-CM | POA: Diagnosis not present

## 2021-02-18 DIAGNOSIS — R29703 NIHSS score 3: Secondary | ICD-10-CM | POA: Diagnosis not present

## 2021-02-18 DIAGNOSIS — N1831 Chronic kidney disease, stage 3a: Secondary | ICD-10-CM | POA: Diagnosis not present

## 2021-02-18 DIAGNOSIS — R200 Anesthesia of skin: Secondary | ICD-10-CM | POA: Diagnosis not present

## 2021-02-18 DIAGNOSIS — E785 Hyperlipidemia, unspecified: Secondary | ICD-10-CM | POA: Diagnosis not present

## 2021-02-18 DIAGNOSIS — G4730 Sleep apnea, unspecified: Secondary | ICD-10-CM | POA: Diagnosis not present

## 2021-02-18 DIAGNOSIS — K219 Gastro-esophageal reflux disease without esophagitis: Secondary | ICD-10-CM | POA: Diagnosis not present

## 2021-02-18 DIAGNOSIS — I129 Hypertensive chronic kidney disease with stage 1 through stage 4 chronic kidney disease, or unspecified chronic kidney disease: Secondary | ICD-10-CM | POA: Diagnosis not present

## 2021-02-18 DIAGNOSIS — G894 Chronic pain syndrome: Secondary | ICD-10-CM | POA: Diagnosis not present

## 2021-02-18 DIAGNOSIS — Q2112 Patent foramen ovale: Secondary | ICD-10-CM | POA: Diagnosis not present

## 2021-02-18 DIAGNOSIS — Z79811 Long term (current) use of aromatase inhibitors: Secondary | ICD-10-CM | POA: Diagnosis not present

## 2021-02-18 DIAGNOSIS — M79604 Pain in right leg: Secondary | ICD-10-CM | POA: Diagnosis not present

## 2021-02-18 DIAGNOSIS — G2581 Restless legs syndrome: Secondary | ICD-10-CM | POA: Diagnosis not present

## 2021-02-18 DIAGNOSIS — F319 Bipolar disorder, unspecified: Secondary | ICD-10-CM | POA: Diagnosis present

## 2021-02-18 DIAGNOSIS — H53131 Sudden visual loss, right eye: Secondary | ICD-10-CM | POA: Diagnosis not present

## 2021-02-18 DIAGNOSIS — C50412 Malignant neoplasm of upper-outer quadrant of left female breast: Secondary | ICD-10-CM | POA: Diagnosis not present

## 2021-02-18 DIAGNOSIS — M7989 Other specified soft tissue disorders: Secondary | ICD-10-CM | POA: Diagnosis not present

## 2021-02-18 DIAGNOSIS — J449 Chronic obstructive pulmonary disease, unspecified: Secondary | ICD-10-CM | POA: Diagnosis not present

## 2021-02-18 DIAGNOSIS — I6310 Cerebral infarction due to embolism of unspecified precerebral artery: Secondary | ICD-10-CM | POA: Diagnosis not present

## 2021-02-18 DIAGNOSIS — R29818 Other symptoms and signs involving the nervous system: Secondary | ICD-10-CM | POA: Diagnosis not present

## 2021-02-18 DIAGNOSIS — Z170 Estrogen receptor positive status [ER+]: Secondary | ICD-10-CM | POA: Diagnosis not present

## 2021-02-18 DIAGNOSIS — I6381 Other cerebral infarction due to occlusion or stenosis of small artery: Secondary | ICD-10-CM | POA: Diagnosis not present

## 2021-02-18 HISTORY — DX: Cerebral infarction, unspecified: I63.9

## 2021-02-20 ENCOUNTER — Inpatient Hospital Stay: Payer: Medicare Other

## 2021-02-20 ENCOUNTER — Inpatient Hospital Stay
Admit: 2021-02-20 | Discharge: 2021-02-20 | Disposition: A | Discharge status: Home / Self Care | Payer: Medicare Other | Attending: Family Medicine | Admitting: Family Medicine

## 2021-02-20 ENCOUNTER — Emergency Department: Payer: Medicare Other

## 2021-02-20 ENCOUNTER — Inpatient Hospital Stay
Admission: EM | Admit: 2021-02-20 | Discharge: 2021-02-22 | DRG: 065 | Disposition: A | Discharge status: Home / Self Care | Payer: Medicare Other | Attending: Internal Medicine | Admitting: Internal Medicine

## 2021-02-20 ENCOUNTER — Encounter: Payer: Self-pay | Admitting: Emergency Medicine

## 2021-02-20 ENCOUNTER — Other Ambulatory Visit: Payer: Self-pay

## 2021-02-20 DIAGNOSIS — G894 Chronic pain syndrome: Secondary | ICD-10-CM | POA: Diagnosis present

## 2021-02-20 DIAGNOSIS — K589 Irritable bowel syndrome without diarrhea: Secondary | ICD-10-CM | POA: Diagnosis present

## 2021-02-20 DIAGNOSIS — I6310 Cerebral infarction due to embolism of unspecified precerebral artery: Secondary | ICD-10-CM | POA: Diagnosis not present

## 2021-02-20 DIAGNOSIS — Q2112 Patent foramen ovale: Secondary | ICD-10-CM | POA: Diagnosis not present

## 2021-02-20 DIAGNOSIS — I129 Hypertensive chronic kidney disease with stage 1 through stage 4 chronic kidney disease, or unspecified chronic kidney disease: Secondary | ICD-10-CM | POA: Diagnosis present

## 2021-02-20 DIAGNOSIS — F319 Bipolar disorder, unspecified: Secondary | ICD-10-CM | POA: Diagnosis present

## 2021-02-20 DIAGNOSIS — G8191 Hemiplegia, unspecified affecting right dominant side: Secondary | ICD-10-CM | POA: Diagnosis present

## 2021-02-20 DIAGNOSIS — E785 Hyperlipidemia, unspecified: Secondary | ICD-10-CM | POA: Diagnosis present

## 2021-02-20 DIAGNOSIS — I253 Aneurysm of heart: Secondary | ICD-10-CM | POA: Diagnosis present

## 2021-02-20 DIAGNOSIS — Z91040 Latex allergy status: Secondary | ICD-10-CM

## 2021-02-20 DIAGNOSIS — G453 Amaurosis fugax: Secondary | ICD-10-CM | POA: Diagnosis present

## 2021-02-20 DIAGNOSIS — Z801 Family history of malignant neoplasm of trachea, bronchus and lung: Secondary | ICD-10-CM

## 2021-02-20 DIAGNOSIS — Z170 Estrogen receptor positive status [ER+]: Secondary | ICD-10-CM

## 2021-02-20 DIAGNOSIS — Z853 Personal history of malignant neoplasm of breast: Secondary | ICD-10-CM | POA: Diagnosis not present

## 2021-02-20 DIAGNOSIS — Z91030 Bee allergy status: Secondary | ICD-10-CM

## 2021-02-20 DIAGNOSIS — R531 Weakness: Secondary | ICD-10-CM | POA: Diagnosis not present

## 2021-02-20 DIAGNOSIS — Z79811 Long term (current) use of aromatase inhibitors: Secondary | ICD-10-CM | POA: Diagnosis not present

## 2021-02-20 DIAGNOSIS — J449 Chronic obstructive pulmonary disease, unspecified: Secondary | ICD-10-CM | POA: Diagnosis present

## 2021-02-20 DIAGNOSIS — Z20822 Contact with and (suspected) exposure to covid-19: Secondary | ICD-10-CM | POA: Diagnosis present

## 2021-02-20 DIAGNOSIS — N1831 Chronic kidney disease, stage 3a: Secondary | ICD-10-CM | POA: Diagnosis not present

## 2021-02-20 DIAGNOSIS — G4730 Sleep apnea, unspecified: Secondary | ICD-10-CM | POA: Diagnosis present

## 2021-02-20 DIAGNOSIS — R29703 NIHSS score 3: Secondary | ICD-10-CM | POA: Diagnosis present

## 2021-02-20 DIAGNOSIS — M79604 Pain in right leg: Secondary | ICD-10-CM

## 2021-02-20 DIAGNOSIS — M79605 Pain in left leg: Secondary | ICD-10-CM | POA: Diagnosis not present

## 2021-02-20 DIAGNOSIS — Z91018 Allergy to other foods: Secondary | ICD-10-CM

## 2021-02-20 DIAGNOSIS — Z79899 Other long term (current) drug therapy: Secondary | ICD-10-CM

## 2021-02-20 DIAGNOSIS — F17210 Nicotine dependence, cigarettes, uncomplicated: Secondary | ICD-10-CM | POA: Diagnosis present

## 2021-02-20 DIAGNOSIS — I6381 Other cerebral infarction due to occlusion or stenosis of small artery: Secondary | ICD-10-CM | POA: Diagnosis present

## 2021-02-20 DIAGNOSIS — Z91041 Radiographic dye allergy status: Secondary | ICD-10-CM | POA: Diagnosis not present

## 2021-02-20 DIAGNOSIS — Z803 Family history of malignant neoplasm of breast: Secondary | ICD-10-CM

## 2021-02-20 DIAGNOSIS — C50412 Malignant neoplasm of upper-outer quadrant of left female breast: Secondary | ICD-10-CM | POA: Diagnosis not present

## 2021-02-20 DIAGNOSIS — N189 Chronic kidney disease, unspecified: Secondary | ICD-10-CM | POA: Diagnosis present

## 2021-02-20 DIAGNOSIS — Z833 Family history of diabetes mellitus: Secondary | ICD-10-CM

## 2021-02-20 DIAGNOSIS — Z888 Allergy status to other drugs, medicaments and biological substances status: Secondary | ICD-10-CM

## 2021-02-20 DIAGNOSIS — Z9221 Personal history of antineoplastic chemotherapy: Secondary | ICD-10-CM

## 2021-02-20 DIAGNOSIS — Z808 Family history of malignant neoplasm of other organs or systems: Secondary | ICD-10-CM

## 2021-02-20 DIAGNOSIS — G2581 Restless legs syndrome: Secondary | ICD-10-CM | POA: Diagnosis present

## 2021-02-20 DIAGNOSIS — Z818 Family history of other mental and behavioral disorders: Secondary | ICD-10-CM

## 2021-02-20 DIAGNOSIS — K219 Gastro-esophageal reflux disease without esophagitis: Secondary | ICD-10-CM | POA: Diagnosis present

## 2021-02-20 DIAGNOSIS — Z981 Arthrodesis status: Secondary | ICD-10-CM

## 2021-02-20 DIAGNOSIS — Z800 Family history of malignant neoplasm of digestive organs: Secondary | ICD-10-CM

## 2021-02-20 DIAGNOSIS — M797 Fibromyalgia: Secondary | ICD-10-CM | POA: Diagnosis present

## 2021-02-20 DIAGNOSIS — Z923 Personal history of irradiation: Secondary | ICD-10-CM

## 2021-02-20 DIAGNOSIS — F419 Anxiety disorder, unspecified: Secondary | ICD-10-CM | POA: Diagnosis present

## 2021-02-20 DIAGNOSIS — I639 Cerebral infarction, unspecified: Secondary | ICD-10-CM

## 2021-02-20 DIAGNOSIS — R29810 Facial weakness: Secondary | ICD-10-CM | POA: Diagnosis present

## 2021-02-20 LAB — CBC
HCT: 42.3 % (ref 36.0–46.0)
Hemoglobin: 13.7 g/dL (ref 12.0–15.0)
MCH: 32.2 pg (ref 26.0–34.0)
MCHC: 32.4 g/dL (ref 30.0–36.0)
MCV: 99.5 fL (ref 80.0–100.0)
Platelets: 196 10*3/uL (ref 150–400)
RBC: 4.25 MIL/uL (ref 3.87–5.11)
RDW: 14.6 % (ref 11.5–15.5)
WBC: 9.6 10*3/uL (ref 4.0–10.5)
nRBC: 0 % (ref 0.0–0.2)

## 2021-02-20 LAB — COMPREHENSIVE METABOLIC PANEL
ALT: 11 U/L (ref 0–44)
AST: 14 U/L — ABNORMAL LOW (ref 15–41)
Albumin: 3.8 g/dL (ref 3.5–5.0)
Alkaline Phosphatase: 84 U/L (ref 38–126)
Anion gap: 8 (ref 5–15)
BUN: 14 mg/dL (ref 6–20)
CO2: 23 mmol/L (ref 22–32)
Calcium: 9.2 mg/dL (ref 8.9–10.3)
Chloride: 106 mmol/L (ref 98–111)
Creatinine, Ser: 1.05 mg/dL — ABNORMAL HIGH (ref 0.44–1.00)
GFR, Estimated: >60 mL/min (ref >60)
Glucose, Bld: 98 mg/dL (ref 70–99)
Potassium: 3.8 mmol/L (ref 3.5–5.1)
Sodium: 137 mmol/L (ref 135–145)
Total Bilirubin: 0.3 mg/dL (ref 0.3–1.2)
Total Protein: 7.4 g/dL (ref 6.5–8.1)

## 2021-02-20 LAB — URINE DRUG SCREEN, QUALITATIVE (ARMC ONLY)
Amphetamines, Ur Screen: NOT DETECTED
Barbiturates, Ur Screen: NOT DETECTED
Benzodiazepine, Ur Scrn: NOT DETECTED
Cannabinoid 50 Ng, Ur ~~LOC~~: NOT DETECTED
Cocaine Metabolite,Ur ~~LOC~~: NOT DETECTED
MDMA (Ecstasy)Ur Screen: NOT DETECTED
Methadone Scn, Ur: NOT DETECTED
Opiate, Ur Screen: NOT DETECTED
Phencyclidine (PCP) Ur S: NOT DETECTED
Tricyclic, Ur Screen: NOT DETECTED

## 2021-02-20 LAB — URINALYSIS, ROUTINE W REFLEX MICROSCOPIC
Bacteria, UA: NONE SEEN
Bilirubin Urine: NEGATIVE
Glucose, UA: NEGATIVE mg/dL
Ketones, ur: NEGATIVE mg/dL
Nitrite: NEGATIVE
Protein, ur: NEGATIVE mg/dL
Specific Gravity, Urine: 1.013 (ref 1.005–1.030)
pH: 7 (ref 5.0–8.0)

## 2021-02-20 LAB — DIFFERENTIAL
Abs Immature Granulocytes: 0.07 10*3/uL (ref 0.00–0.07)
Basophils Absolute: 0.1 10*3/uL (ref 0.0–0.1)
Basophils Relative: 1 %
Eosinophils Absolute: 0.3 10*3/uL (ref 0.0–0.5)
Eosinophils Relative: 3 %
Immature Granulocytes: 1 %
Lymphocytes Relative: 19 %
Lymphs Abs: 1.9 10*3/uL (ref 0.7–4.0)
Monocytes Absolute: 0.6 10*3/uL (ref 0.1–1.0)
Monocytes Relative: 6 %
Neutro Abs: 6.7 10*3/uL (ref 1.7–7.7)
Neutrophils Relative %: 70 %

## 2021-02-20 LAB — RESP PANEL BY RT-PCR (FLU A&B, COVID) ARPGX2
Influenza A by PCR: NEGATIVE
Influenza B by PCR: NEGATIVE
SARS Coronavirus 2 by RT PCR: NEGATIVE

## 2021-02-20 LAB — APTT: aPTT: 27 seconds (ref 24–36)

## 2021-02-20 LAB — ETHANOL: Alcohol, Ethyl (B): <10 mg/dL (ref <10)

## 2021-02-20 LAB — TROPONIN I (HIGH SENSITIVITY)
Troponin I (High Sensitivity): 74 ng/L — ABNORMAL HIGH (ref <18)
Troponin I (High Sensitivity): 63 ng/L — ABNORMAL HIGH (ref <18)

## 2021-02-20 LAB — PROTIME-INR
INR: 1 (ref 0.8–1.2)
Prothrombin Time: 12.9 seconds (ref 11.4–15.2)

## 2021-02-20 LAB — CBG MONITORING, ED: Glucose-Capillary: 88 mg/dL (ref 70–99)

## 2021-02-20 MED ORDER — PROMETHAZINE HCL 25 MG PO TABS
25.0000 mg | ORAL_TABLET | Freq: Four times a day (QID) | ORAL | Status: DC | PRN
  Filled 2021-02-20: qty 1

## 2021-02-20 MED ORDER — ENOXAPARIN SODIUM 40 MG/0.4ML IJ SOSY
40.0000 mg | PREFILLED_SYRINGE | INTRAMUSCULAR | Status: DC
  Administered 2021-02-20 – 2021-02-22 (×3): 40 mg via SUBCUTANEOUS
  Filled 2021-02-20 (×3): qty 0.4

## 2021-02-20 MED ORDER — VITAMIN E 45 MG (100 UNIT) PO CAPS
200.0000 [IU] | ORAL_CAPSULE | ORAL | Status: DC
  Administered 2021-02-20 – 2021-02-21 (×2): 200 [IU] via ORAL
  Filled 2021-02-20 (×3): qty 2

## 2021-02-20 MED ORDER — ACETAMINOPHEN 650 MG RE SUPP: 650.0000 mg | RECTAL | Status: DC | PRN

## 2021-02-20 MED ORDER — CLOPIDOGREL BISULFATE 75 MG PO TABS: 75.0000 mg | ORAL_TABLET | Freq: Every day | ORAL | Status: DC

## 2021-02-20 MED ORDER — VITAMIN D 25 MCG (1000 UNIT) PO TABS
1000.0000 [IU] | ORAL_TABLET | Freq: Two times a day (BID) | ORAL | Status: DC
  Administered 2021-02-21 – 2021-02-22 (×3): 1000 [IU] via ORAL
  Filled 2021-02-20 (×3): qty 1

## 2021-02-20 MED ORDER — CALCIUM CARBONATE 1250 (500 CA) MG PO TABS
1250.0000 mg | ORAL_TABLET | Freq: Every day | ORAL | Status: DC
  Administered 2021-02-21 – 2021-02-22 (×2): 1250 mg via ORAL
  Filled 2021-02-20 (×2): qty 1

## 2021-02-20 MED ORDER — ROPINIROLE HCL 1 MG PO TABS
1.5000 mg | ORAL_TABLET | Freq: Every day | ORAL | Status: DC
  Administered 2021-02-20 – 2021-02-21 (×2): 1.5 mg via ORAL
  Filled 2021-02-20: qty 2
  Filled 2021-02-20: qty 1.5
  Filled 2021-02-20 (×2): qty 2

## 2021-02-20 MED ORDER — NALOXONE HCL 4 MG/0.1ML NA LIQD
0.4000 mg | Freq: Once | NASAL | Status: DC | PRN
  Filled 2021-02-20: qty 8

## 2021-02-20 MED ORDER — ACETAMINOPHEN 325 MG PO TABS: 650.0000 mg | ORAL_TABLET | ORAL | Status: DC | PRN

## 2021-02-20 MED ORDER — BACLOFEN 10 MG PO TABS
10.0000 mg | ORAL_TABLET | Freq: Three times a day (TID) | ORAL | Status: DC | PRN
  Administered 2021-02-20: 10 mg via ORAL
  Filled 2021-02-20 (×2): qty 1

## 2021-02-20 MED ORDER — SODIUM CHLORIDE 0.9 % IV SOLN: INTRAVENOUS | Status: DC

## 2021-02-20 MED ORDER — IPRATROPIUM-ALBUTEROL 0.5-2.5 (3) MG/3ML IN SOLN
3.0000 mL | Freq: Four times a day (QID) | RESPIRATORY_TRACT | Status: DC
  Filled 2021-02-20: qty 3

## 2021-02-20 MED ORDER — OXYCODONE HCL 5 MG PO TABS
15.0000 mg | ORAL_TABLET | Freq: Four times a day (QID) | ORAL | Status: DC | PRN
  Administered 2021-02-20 – 2021-02-21 (×3): 15 mg via ORAL
  Filled 2021-02-20 (×3): qty 3

## 2021-02-20 MED ORDER — HAIR SKIN AND NAILS FORMULA PO TABS: 2.0000 | ORAL_TABLET | Freq: Every day | ORAL | Status: DC

## 2021-02-20 MED ORDER — ASPIRIN EC 81 MG PO TBEC
81.0000 mg | DELAYED_RELEASE_TABLET | Freq: Every day | ORAL | Status: DC
  Administered 2021-02-20 – 2021-02-22 (×3): 81 mg via ORAL
  Filled 2021-02-20 (×3): qty 1

## 2021-02-20 MED ORDER — ANASTROZOLE 1 MG PO TABS
1.0000 mg | ORAL_TABLET | Freq: Every day | ORAL | Status: DC
  Administered 2021-02-21 – 2021-02-22 (×2): 1 mg via ORAL
  Filled 2021-02-20 (×3): qty 1

## 2021-02-20 MED ORDER — ACETAMINOPHEN 160 MG/5ML PO SOLN
650.0000 mg | ORAL | Status: DC | PRN
  Filled 2021-02-20: qty 20.3

## 2021-02-20 MED ORDER — PREGABALIN 25 MG PO CAPS
25.0000 mg | ORAL_CAPSULE | Freq: Two times a day (BID) | ORAL | Status: DC
  Administered 2021-02-20 – 2021-02-22 (×3): 25 mg via ORAL
  Filled 2021-02-20 (×4): qty 1

## 2021-02-20 MED ORDER — TRAZODONE HCL 50 MG PO TABS: 25.0000 mg | ORAL_TABLET | Freq: Every evening | ORAL | Status: DC | PRN

## 2021-02-20 MED ORDER — STROKE: EARLY STAGES OF RECOVERY BOOK: Freq: Once | Status: AC

## 2021-02-20 MED ORDER — CLOPIDOGREL BISULFATE 75 MG PO TABS
75.0000 mg | ORAL_TABLET | Freq: Every day | ORAL | Status: DC
  Administered 2021-02-20 – 2021-02-22 (×3): 75 mg via ORAL
  Filled 2021-02-20 (×3): qty 1

## 2021-02-20 MED ORDER — TRAZODONE HCL 50 MG PO TABS
50.0000 mg | ORAL_TABLET | Freq: Every day | ORAL | Status: DC
  Administered 2021-02-20 – 2021-02-21 (×2): 50 mg via ORAL
  Filled 2021-02-20 (×2): qty 1

## 2021-02-20 MED ORDER — ROSUVASTATIN CALCIUM 20 MG PO TABS
40.0000 mg | ORAL_TABLET | Freq: Every day | ORAL | Status: DC
  Administered 2021-02-20 – 2021-02-21 (×2): 40 mg via ORAL
  Filled 2021-02-20 (×3): qty 2

## 2021-02-20 MED ORDER — SENNOSIDES-DOCUSATE SODIUM 8.6-50 MG PO TABS: 1.0000 | ORAL_TABLET | Freq: Every evening | ORAL | Status: DC | PRN

## 2021-02-20 MED ORDER — EPINEPHRINE 0.15 MG/0.3ML IJ SOAJ
0.1500 mg | INTRAMUSCULAR | Status: DC | PRN
  Filled 2021-02-20: qty 0.6

## 2021-02-20 MED ORDER — TOPIRAMATE 100 MG PO TABS
200.0000 mg | ORAL_TABLET | Freq: Every day | ORAL | Status: DC
  Administered 2021-02-21 – 2021-02-22 (×2): 200 mg via ORAL
  Filled 2021-02-20: qty 8
  Filled 2021-02-20 (×2): qty 2

## 2021-02-20 NOTE — ED Provider Triage Note (Signed)
Emergency Medicine Provider Triage Evaluation Note  Briana [name] , a 56 y.o. female  was evaluated in triage.  Pt complains of right side facial numbness, right upper extremity and right lower extremity weakness.  This started approximately 9 AM this morning after getting a phone call from her neurologist about her MRI/MRI of her brain which showed:  IMPRESSION: MRI HEAD IMPRESSION:   1. Few scattered punctate foci of diffusion abnormality involving the left cerebral hemisphere and left cerebellum, consistent with tiny acute to early subacute ischemic infarcts. No associated hemorrhage or mass effect. These are likely embolic in nature. 2. Chronic hemorrhagic lacunar infarct at the left basal ganglia.   MRA HEAD IMPRESSION:   1. Normal intracranial MRA. No large vessel occlusion or hemodynamically significant stenosis. 2. Fetal type origin of the right PCA.  She reports Thursday she lost vision in her right eye prior to her scan.  She reports history of TIA 6 to 7 years ago.  She has a history of HLD, smoker.    Review of Systems  Positive: Right-sided facial numbness/right upper extremity, right lower extremity weakness Negative: Headache, dizziness, vision changes, difficulty with speech, confusion  Physical Exam  LMP 08/24/2008  Gen:   Awake, no distress   Resp:  Normal effort  Cardio:  RRR Neuro:  EOM's intact, slight asymmetry when she smiles and puffs out her check on the right. She has RUE weakness with flexion of the right elbow, strength 4/5 RUE/RLE. Strength 5/5 LUE/LLE. Normal speech:   Right side facial numbness, right upper extremity/right lower extremity weakness:  Stat CT head CBG Taken to C pod for further evaluation and treatment  Medical Decision Making  Medically screening exam initiated at 12:49 PM.  Appropriate orders placed.  Briana [name] was informed that the remainder of the evaluation will be completed by another provider, this initial triage assessment  does not replace that evaluation, and the importance of remaining in the ED until their evaluation is complete.     Jearld Fenton, NP 02/20/21 1303

## 2021-02-20 NOTE — ED Notes (Addendum)
Patient transported to MRI; per MRI tech, patient to go to [place] following MRI.  Patient asked to take phone and purse with her, transported on stretcher with patient per request.

## 2021-02-20 NOTE — H&P (Signed)
[place]   PATIENT NAME: Briana [name]    MR#:  [id number]  DATE OF BIRTH:  [birth year]  DATE OF ADMISSION:  02/20/2021  PRIMARY CARE PHYSICIAN: [name], MD   Patient is coming from: Home  REQUESTING/REFERRING PHYSICIAN: [name], MD  CHIEF COMPLAINT:   Chief Complaint  Patient presents with   Numbness    HISTORY OF PRESENT ILLNESS:  Briana [name] is a 56 y.o. Caucasian female with medical history significant for asthma, COPD, depression, fibromyalgia, IBS, migraine, sleep apnea and RLS, who presented to the ER with acute onset of right upper extremity weakness and numbness with less weakness on the right leg that started last night.  She was seen at the [facility] clinic a couple days ago and had MRI then revealed few scattered punctate foci of diffusion abnormality involving the left cerebral hemisphere and left cerebellum consistent with tiny acute to early subacute ischemic infarcts with no associated hemorrhage or mass-effect.  There are likely embolic in nature.  It showed chronic hemorrhagic lacunar infarct at the left basal ganglia.  MR a of the head was normal.  It showed no large vessel occlusion or hemodynamically significant stenosis and fetal Origin of the right PCA.  She denies any headache or dizziness or blurred vision.  No chest pain or palpitations.  She has diminished right lower visual field.  No witnessed seizures.  No urinary or stool incontinence.  No tinnitus or vertigo.  No cough or wheezing or hemoptysis.  No bleeding diathesis.  ED Course: When she came to the ER vital signs were within normal.  Labs revealed unremarkable CMP and CBC.  High-sensitivity troponin I was 74.  Influenza antigens and COVID-19 PCR came back negative.  Alcohol level was less than 10. EKG as reviewed by me : EKG showed normal sinus rhythm rate of 78 with chronic progression. Imaging: Noncontrasted CT scan showed chronic infarct in left anterior basal ganglia and multiple  small areas of acute infarct noted on recent MRI with no new infarct or hemorrhage. Repeat brain MRI with MRA showed the following: 1. Subcentimeter acute infarct right anterior cerebellar vermis is new since the MRI 2 days ago. No change in multiple small embolic infarcts in the left cerebral hemisphere and left cerebellum. 2. Chronic infarct left anterior basal ganglia unchanged 3. Negative MRA neck without contrast.  The patient was seen by Dr. Quinn Axe who recommended aspirin and refused.  She was advised by Dr. Holley Raring in the past not to take aspirin, likely NSAIDs.  When I discussed it with her and explained that her renal functions are completely normal now and benefits significantly outweigh any risk at this time, she agreed to take aspirin and Plavix.  She will be admitted to a medical telemetry bed for further evaluation and management.  PAST MEDICAL HISTORY:   Past Medical History:  Diagnosis Date   Abnormal Pap smear of cervix    01/2015 ascus/neg- 04/2015 ascus/neg   Allergy    Anxiety    Arthritis    Asthma    Breast cancer ([place]) 2019   Chronic kidney disease    COPD (chronic obstructive pulmonary disease) (HCC)    DDD (degenerative disc disease), lumbar    Depression    Elevated lipids    Fibromyalgia    Fibromyalgia    Genetic testing 03/09/2017   Multi-Cancer panel (83 genes) @ [facility] - No pathogenic mutations detected   GERD (gastroesophageal reflux disease)  History of IBS    Hyperthyroidism    Joint disease    Migraine    Migraine    Oxygen deficiency    Personal history of chemotherapy    Personal history of radiation therapy    Restless leg syndrome    Sleep apnea    Does not use C-PAP, cannot tolerate mask    PAST SURGICAL HISTORY:   Past Surgical History:  Procedure Laterality Date   ABDOMINAL HYSTERECTOMY  2008   BREAST BIOPSY Left 02/02/2017   [place] core positive   BREAST LUMPECTOMY Left 02/17/2017   BREAST  LUMPECTOMY WITH NEEDLE LOCALIZATION Left 02/17/2017   Procedure: BREAST LUMPECTOMY WITH NEEDLE LOCALIZATION;  Surgeon: Vickie Epley, MD;  Location: [facility];  Service: General;  Laterality: Left;   CARPAL TUNNEL RELEASE Bilateral    cryotherapy     DILATION AND CURETTAGE OF UTERUS     ENDOMETRIAL ABLATION     LAPAROSCOPIC OOPHERECTOMY Left    unsure which side but thinks its the left   LYSIS OF ADHESION Left 10/14/2015   Procedure: LYSIS OF ADHESION;  Surgeon: Thornton Park, MD;  Location: [facility];  Service: Orthopedics;  Laterality: Left;   NECK SURGERY     lower neck fusion rods and screws   OOPHORECTOMY     one ovary removed    PORTA CATH INSERTION N/A 03/08/2017   Procedure: PORTA CATH INSERTION;  Surgeon: Katha Cabal, MD;  Location: [place] [facility];  Service: Cardiovascular;  Laterality: N/A;   RESECTION DISTAL CLAVICAL Left 10/14/2015   Procedure: RESECTION DISTAL CLAVICAL;  Surgeon: Thornton Park, MD;  Location: [facility];  Service: Orthopedics;  Laterality: Left;   SENTINEL NODE BIOPSY Left 02/17/2017   Procedure: SENTINEL NODE BIOPSY;  Surgeon: Vickie Epley, MD;  Location: [facility];  Service: General;  Laterality: Left;   SHOULDER ARTHROSCOPY WITH OPEN ROTATOR CUFF REPAIR Left 10/14/2015   Procedure: SHOULDER ARTHROSCOPY WITH OPEN ROTATOR CUFF REPAIR;  Surgeon: Thornton Park, MD;  Location: [facility];  Service: Orthopedics;  Laterality: Left;   SHOULDER ARTHROSCOPY WITH OPEN ROTATOR CUFF REPAIR AND DISTAL CLAVICLE ACROMINECTOMY Right 09/03/2014   Procedure: RIGHT SHOULDER ARTHROSCOPY WITH MINI OPEN ROTATOR CUFF TEAR;  Surgeon: Thornton Park, MD;  Location: [facility];  Service: Orthopedics;  Laterality: Right;  biceps tenodesis, arthroscopic subacromial decompression and distal clavicle incision   spinal injections     SUBACROMIAL DECOMPRESSION Left 10/14/2015   Procedure: SUBACROMIAL DECOMPRESSION;  Surgeon: Thornton Park, MD;  Location: [facility];   Service: Orthopedics;  Laterality: Left;    SOCIAL HISTORY:   Social History   Tobacco Use   Smoking status: Every Day    Packs/day: 1.00    Types: Cigarettes   Smokeless tobacco: Never  Substance Use Topics   Alcohol use: No    Alcohol/week: 0.0 standard drinks    FAMILY HISTORY:   Family History  Problem Relation Age of Onset   Breast cancer Mother 19       Glioblastoma also; deceased at 39   Diabetes Father    Lung cancer Father        mesothelioma; deceased 28s   Cancer Maternal Aunt        "bone ca"; unk. primary   Colon cancer Maternal Grandfather        dx in 40s; deceased 26   Colon cancer Maternal Aunt        dx in 30s; currently 44s   Lung cancer Maternal Grandmother  smoker; deceased 12   Lung cancer Paternal Grandmother        smoker; deceased 55s   Breast cancer Cousin        dx 67s; daughter of mat aunt with unk. primary cancer   Cancer Other        distant cousin; unknown primary   Colon cancer Other        dx 46s; currently 37; maternal half-sister   Bipolar disorder Sister    Schizophrenia Sister     DRUG ALLERGIES:   Allergies  Allergen Reactions   Iodine    Bee Venom Hives and Rash   Cefoxitin Rash   Cefuroxime Axetil Hives and Rash   Cucumber Extract Rash   Gadolinium Derivatives Swelling, Other (See Comments) and Rash    NECK BECAME RED AND TONGUE WAS SWELLING SLIGHTLY PER PT NECK BECAME RED AND TONGUE WAS SWELLING SLIGHTLY PER PT NECK BECAME RED AND TONGUE WAS SWELLING SLIGHTLY PER PT   Iodinated Contrast Media Hives and Rash   Latex Rash   Other Hives and Rash    Bee Stings-swelling/hives/rash Wool (textile fiber)-Rash/itching   Tomato Rash    Red tomatoes    REVIEW OF SYSTEMS:   ROS As per history of present illness. All pertinent systems were reviewed above. Constitutional, HEENT, cardiovascular, respiratory, GI, GU, musculoskeletal, neuro, psychiatric, endocrine, integumentary and  hematologic systems were reviewed and are otherwise negative/unremarkable except for positive findings mentioned above in the HPI.   MEDICATIONS AT HOME:   Prior to Admission medications   Medication Sig Start Date End Date Taking? Authorizing Provider  anastrozole (ARIMIDEX) 1 MG tablet Take 1 tablet (1 mg total) by mouth daily. 03/12/20   Noreene Filbert, MD  baclofen (LIORESAL) 10 MG tablet Take 10 mg by mouth 3 (three) times daily as needed for muscle spasms (typically 2-3 times daily).     [provider]  calcium carbonate (OS-CAL) 600 MG TABS tablet Take by mouth.    [provider]  cholecalciferol (VITAMIN D) 1000 UNITS tablet Take 1,000 Units by mouth 2 (two) times daily.     [provider]  EPINEPHrine (EPIPEN JR) 0.15 MG/0.3ML injection Inject 0.15 mg into the muscle as needed for anaphylaxis.    [provider]  hydrocortisone 2.5 % cream Apply 1 application topically 2 (two) times daily as needed (for itchy/dry skin.). Patient not taking: Reported on 10/30/2020    [provider]  Multiple Vitamins-Minerals (HAIR SKIN AND NAILS FORMULA) TABS Take 2 tablets by mouth daily.    [provider]  NARCAN 4 MG/0.1ML LIQD nasal spray kit CALL 911. ADMINISTER A SINGLE SPRAY OF NARCAN IN ONE NOSTRIL. REPEAT EVERY 3 MINUTES AS NEEDED IF NO OR MINIMAL RESPONSE. 02/11/19   [provider]  OXYCODONE HCL PO Take 15 mg by mouth as needed. Take 4-6 times a day as needed.    [provider]  pregabalin (LYRICA) 25 MG capsule Take 1 capsule by mouth 2 (two) times daily. 04/01/19   [provider]  promethazine (PHENERGAN) 25 MG tablet Take 25 mg by mouth every 6 (six) hours as needed for nausea or vomiting.    [provider]  rizatriptan (MAXALT-MLT) 10 MG disintegrating tablet Take 10 mg by mouth as needed for migraine. May repeat in 2 hours if needed    [name], MD  rOPINIRole (REQUIP) 1 MG tablet Take 1.5  mg by mouth at bedtime. 10/08/19   [provider]  rosuvastatin (  CRESTOR) 40 MG tablet Take 40 mg by mouth at bedtime.  08/07/15   [provider]  topiramate (TOPAMAX) 100 MG tablet Take 200 mg by mouth daily.     [provider]  traZODone (DESYREL) 50 MG tablet Take 50 mg by mouth at bedtime.    [provider]  vitamin E 200 UNIT capsule Take 200 Units by mouth 4 (four) times a week. Take 4-5 times a week    [provider]      VITAL SIGNS:  Blood pressure 121/74, pulse 76, temperature 98.1 F (36.7 C), temperature source Oral, resp. rate 17, height 5' 3" (1.6 m), weight 79.4 kg, last menstrual period 08/24/2008, SpO2 98 %.  PHYSICAL EXAMINATION:  Physical Exam  GENERAL:  56 y.o.-year-old Caucasian female patient lying in the bed with no acute distress.  EYES: Pupils equal, round, reactive to light and accommodation. No scleral icterus. Extraocular muscles intact.  HEENT: Head atraumatic, normocephalic. Oropharynx and nasopharynx clear.  NECK:  Supple, no jugular venous distention. No thyroid enlargement, no tenderness.  LUNGS: Normal breath sounds bilaterally, no wheezing, rales,rhonchi or crepitation. No use of accessory muscles of respiration.  CARDIOVASCULAR: Regular rate and rhythm, S1, S2 normal. No murmurs, rubs, or gallops.  ABDOMEN: Soft, nondistended, nontender. Bowel sounds present. No organomegaly or mass.  EXTREMITIES: No pedal edema, cyanosis, or clubbing.  NEUROLOGIC: Cranial nerves II through XII are intact. Muscle strength 5/5 in all extremities. Sensation intact. Gait not checked.  No pronator drift was appreciated during my exam. PSYCHIATRIC: The patient is alert and oriented x 3.  Normal affect and good eye contact. SKIN: No obvious rash, lesion, or ulcer.   LABORATORY PANEL:   CBC Recent Labs  Lab 02/20/21 1337  WBC 9.6  HGB 13.7  HCT 42.3  PLT 196    ------------------------------------------------------------------------------------------------------------------  Chemistries  Recent Labs  Lab 02/20/21 1337  NA 137  K 3.8  CL 106  CO2 23  GLUCOSE 98  BUN 14  CREATININE 1.05*  CALCIUM 9.2  AST 14*  ALT 11  ALKPHOS 84  BILITOT 0.3   ------------------------------------------------------------------------------------------------------------------  Cardiac Enzymes No results for input(s): TROPONINI in the last 168 hours. ------------------------------------------------------------------------------------------------------------------  RADIOLOGY:  CT HEAD WO CONTRAST (5MM)  Result Date: 02/20/2021 CLINICAL DATA:  Acute neuro deficit.  Right-sided numbness EXAM: CT HEAD WITHOUT CONTRAST TECHNIQUE: Contiguous axial images were obtained from the base of the skull through the vertex without intravenous contrast. RADIATION DOSE REDUCTION: This exam was performed according to the departmental dose-optimization program which includes automated exposure control, adjustment of the mA and/or kV according to patient size and/or use of iterative reconstruction technique. COMPARISON:  MRI head 02/18/2021 FINDINGS: Brain: Several small areas of restricted diffusion in the left cerebellum and left cerebral hemisphere on MRI are not identified on CT. No acute cortical infarct or hemorrhage by CT. No mass Chronic infarct left lenticular nuclei unchanged. Ventricle size and cerebral volume normal. Vascular: Negative for hyperdense vessel Skull: Negative Sinuses/Orbits: Paranasal sinuses clear.  Negative orbit Other: None IMPRESSION: Negative for acute infarct or hemorrhage Chronic infarct left anterior basal ganglia. Multiple small areas of acute infarct noted on recent MRI. These results were called by telephone at the time of interpretation on 02/20/2021 at 1:20 pm to provider [facility] , who verbally acknowledged these results. Electronically Signed    By: Franchot Gallo M.D.   On: 02/20/2021 13:21      IMPRESSION AND PLAN:  Principal Problem:   CVA (cerebral  vascular accident) ([place])  1.  Acute right anterior cerebral vermis lacunar infarct and recent multiple small infarcts in the left cerebral and cerebellar hemisphere.  The patient has subsequent to the right sided hemiparesis that is almost resolved. - The patient will be admitted to a medical telemetry bed. - We will follow neurochecks every 4 hours for 24 hours. - We will obtain a 2D echo with bubble study. - PT/OT and ST consults will be obtained. - She will be on baby aspirin, Plavix as well as statin therapy. - Follow-up neurology consult will be obtained. - Dr. Quinn Axe is aware about the patient and has seen her.  2.  History of left breast cancer. - We will continue Arimidex.  3.  Dyslipidemia. - We will continue statin therapy.  4.  RLS. - We will continue Requip.  6.  Bipolar disorder and history of migraine. - We will continue Topamax and hold off Maxalt.  7.  Asthma/COPD without exacerbation. - She will be placed on as needed DuoNebs.  DVT prophylaxis : Lovenox.  Code Status: full code.  Family Communication:  The plan of care was discussed in details with the patient (and family). I answered all questions. The patient agreed to proceed with the above mentioned plan. Further management will depend upon hospital course. Disposition Plan: Back to previous home environment Consults called: Neurology. All the records are reviewed and case discussed with ED provider.  Status is: Inpatient  At the time of the admission, it appears that the appropriate admission status for this patient is inpatient.  This is judged to be reasonable and necessary in order to provide the required intensity of service to ensure the patient's safety given the presenting symptoms, physical exam findings and initial radiographic and laboratory data in the context of comorbid conditions.   The patient requires inpatient status due to high intensity of service, high risk of further deterioration and high frequency of surveillance required.  I certify that at the time of admission, it is my clinical judgment that the patient will require inpatient hospital care extending more than 2 midnights.                            Dispo: The patient is from: Home              Anticipated d/c is to: Home              Patient currently is not medically stable to d/c.              Difficult to place patient: No  Christel Mormon M.D on 02/20/2021 at 3:05 PM  [facility]   From 7 PM-7 AM, contact night-coverage [email or web address]  CC: Primary care physician; [name], MD

## 2021-02-20 NOTE — ED Provider Notes (Signed)
Concerning for an acute stroke, with evidence of possible superimposed  [facility] Provider Note    Event Date/Time   First MD Initiated Contact with Patient 02/20/21 1316     (approximate)   History   Numbness   HPI  RHIANA MORASH is a 56 y.o. female who presents to the ED for evaluation of Numbness   I review outpatient neurology visit from 1/4 due to intractable migraines.  Otherwise history of obesity, stroke on DAPT.  She just had an outpatient MRI performed on 1/19 that I review with few scattered punctate foci with acute/subacute ischemic infarcts.  Associated MRA without significant stenosis.  Patient presents to the ED for evaluation of intermittent vision difficulties, numbness and weakness to the right side and chest pain.  Since she had the MRI performed on Thursday, she reports intermittent chest pain, intermittent weakness and sensorium changes to the right upper extremity, as well as fleeting vision loss to her right lower visual fields.  Reports no symptoms right now, and that they are "clearing up."  Denies any chest pain right now, and reports chest pain primarily when she is feeling these sensation changes to her extremities.   Physical Exam   Triage Vital Signs: ED Triage Vitals  Enc Vitals Group     BP --      Pulse --      Resp --      Temp 02/20/21 1251 98.1 F (36.7 C)     Temp Source 02/20/21 1251 Oral     SpO2 --      Weight 02/20/21 1305 175 lb (79.4 kg)     Height 02/20/21 1305 5\' 3"  (1.6 m)     Head Circumference --      Peak Flow --      Pain Score 02/20/21 1305 0     Pain Loc --      Pain Edu? --      Excl. in [place]? --     Most recent vital signs: Vitals:   02/20/21 1251 02/20/21 1345  BP:  121/74  Pulse:  76  Resp:  17  Temp: 98.1 F (36.7 C)   SpO2:  98%    General: Awake, no distress. CV:  Good peripheral perfusion. RRR Resp:  Normal effort.  Abd:  No distention.  MSK:  No deformity noted.   Neuro:  Mild weakness on grip strength on the right compared to the left, 4/5.  Slight facial asymmetry is noted.  Faltering of the right lower extremity with elevation, slight weakness compared to the left.  No visual field cut to my examination. Other:     ED Results / Procedures / Treatments   Labs (all labs ordered are listed, but only abnormal results are displayed) Labs Reviewed  COMPREHENSIVE METABOLIC PANEL - Abnormal; Notable for the following components:      Result Value   Creatinine, Ser 1.05 (*)    AST 14 (*)    All other components within normal limits  RESP PANEL BY RT-PCR (FLU A&B, COVID) ARPGX2  ETHANOL  PROTIME-INR  APTT  CBC  DIFFERENTIAL  URINE DRUG SCREEN, QUALITATIVE ([facility] ONLY)  URINALYSIS, ROUTINE W REFLEX MICROSCOPIC  LDL CHOLESTEROL, DIRECT  HEMOGLOBIN A1C  CBG MONITORING, ED  POC URINE PREG, ED  TROPONIN I (HIGH SENSITIVITY)    EKG Sinus rhythm, rate of 78 bpm.  Normal axis and intervals.  No evidence of acute ischemia.  RADIOLOGY CT head reviewed by me without  evidence of acute intracranial pathology.  Official radiology report(s): CT HEAD WO CONTRAST (5MM)  Result Date: 02/20/2021 CLINICAL DATA:  Acute neuro deficit.  Right-sided numbness EXAM: CT HEAD WITHOUT CONTRAST TECHNIQUE: Contiguous axial images were obtained from the base of the skull through the vertex without intravenous contrast. RADIATION DOSE REDUCTION: This exam was performed according to the departmental dose-optimization program which includes automated exposure control, adjustment of the mA and/or kV according to patient size and/or use of iterative reconstruction technique. COMPARISON:  MRI head 02/18/2021 FINDINGS: Brain: Several small areas of restricted diffusion in the left cerebellum and left cerebral hemisphere on MRI are not identified on CT. No acute cortical infarct or hemorrhage by CT. No mass Chronic infarct left lenticular nuclei unchanged. Ventricle size and cerebral  volume normal. Vascular: Negative for hyperdense vessel Skull: Negative Sinuses/Orbits: Paranasal sinuses clear.  Negative orbit Other: None IMPRESSION: Negative for acute infarct or hemorrhage Chronic infarct left anterior basal ganglia. Multiple small areas of acute infarct noted on recent MRI. These results were called by telephone at the time of interpretation on 02/20/2021 at 1:20 pm to provider [facility] , who verbally acknowledged these results. Electronically Signed   By: Franchot Gallo M.D.   On: 02/20/2021 13:21    PROCEDURES and INTERVENTIONS:  .1-3 Lead EKG Interpretation Performed by: [name], MD Authorized by: [name], MD     Interpretation: normal     ECG rate:  70   ECG rate assessment: normal     Rhythm: sinus rhythm     Ectopy: none     Conduction: normal   .Critical Care Performed by: [name], MD Authorized by: [name], MD   Critical care provider statement:    Critical care time (minutes):  30   Critical care time was exclusive of:  Separately billable procedures and treating other patients   Critical care was necessary to treat or prevent imminent or life-threatening deterioration of the following conditions:  CNS failure or compromise   Critical care was time spent personally by me on the following activities:  Development of treatment plan with patient or surrogate, discussions with consultants, evaluation of patient's response to treatment, examination of patient, ordering and review of laboratory studies, ordering and review of radiographic studies, ordering and performing treatments and interventions, pulse oximetry, re-evaluation of patient's condition and review of old charts  Medications  clopidogrel (PLAVIX) tablet 75 mg (has no administration in time range)     IMPRESSION / MDM / ASSESSMENT AND PLAN / ED COURSE  I reviewed the triage vital signs and the nursing notes.  56 year old female presents to the ED due to abnormal outpatient  MRI, concerning for acute stroke and requiring medical admission.  Vitals are normal on room air.  She looks systemically well, but does have an NIH of about 3 due to facial asymmetry and right-sided weakness.  She had an abnormal outpatient MRI a couple days ago, but with this new right-sided weakness and after my discussion with neurology colleagues, I am concerned about a superimposed more acute stroke with his right-sided weakness.  Code stroke protocols are followed and neurology recommendations are noted.  We will consult with medicine for admission. She is reporting atypical chest pains, but has no evidence of ACS.  EKG is nonischemic and looks well.  Basic labs are unremarkable with normal metabolic panel and blood counts.  Coagulation panel is reassuring.  Clinical Course as of 02/20/21 1436  Sat Feb 20, 2021  1319 I  consult with Dr. Quinn Axe, neuro.  [DS]  1320 Call from rads, CT without acute features [DS]    Clinical Course User Index [DS] [name], MD     FINAL CLINICAL IMPRESSION(S) / ED DIAGNOSES   Final diagnoses:  Cerebrovascular accident (CVA), unspecified mechanism ([place])     Rx / DC Orders   ED Discharge Orders     None        Note:  This document was prepared using Dragon voice recognition software and may include unintentional dictation errors.   [name], MD 02/20/21 [id number]

## 2021-02-20 NOTE — ED Triage Notes (Addendum)
Pt via POV from home. Pt c/o R sided numbness and facial numbness. Pt was seen on 1/19 for an MRI and told her she was having an "active stroke" Pt states that she was having issues with her vision and that is why she went to the Neurologist. Pt states she is having NEW R sided numbness, facial numbness, and facial droop that started 0900 this AM that she did not have previously. Pt is A&Ox4 and NAD

## 2021-02-20 NOTE — Consult Note (Signed)
NEUROLOGY CONSULTATION NOTE   Date of service: February 20, 2021 Patient Name: Briana [name] MRN:  [id number] DOB:  04-04-1965 Reason for consult: stroke code R sided weakness and numbness Requesting physician: Dr. [name] _ _ _   _ __   _ __ _ _  __ __   _ __   __ _  History of Present Illness   This is a 56 year old woman with a history of breast cancer 2019, CKD, COPD, hyperlipidemia, migraine, prior hemorrhagic left basal ganglia infarct and recent acute to subacute infarcts 4 days ago on MRI who presents with new onset right-sided weakness and numbness since 9 AM this morning.  She recently had 4-5 episodes of amaurosis fugax in the left eye and therefore underwent MR I brain with her outpatient neurologist on February 18, 2021 which showed a few scattered punctate of restricted diffusion in the left cerebral hemisphere and left cerebellum consistent with tiny acute to early subacute ischemic infarcts of likely central embolic source.  She received a phone call today about the MRI results and was told to come to the hospital.  Around the same time this morning she began to have some right-sided facial droop and slight subjective weakness and numbness of her right upper and lower extremity.  She has refused to take aspirin in the past as she was told by her nephrologist not to in the setting of her CKD. NIHSS = 3 for R facial droop, RLE drift, and RLE sensory deficit. Not a TNK candidate 2/2 hx prior ICH and recent acute ischemic infarcts within the past week. CT showed chronic infarct L BG, no acute findings. Exam not c/w LVO and patient has allergy to contrast so CTA/CTP not performed.    ROS   Per HPI: all other systems reviewed and are negative  Past History   I have reviewed the following:  Past Medical History:  Diagnosis Date   Abnormal Pap smear of cervix    01/2015 ascus/neg- 04/2015 ascus/neg   Allergy    Anxiety    Arthritis    Asthma    Breast cancer ([place]) 2019   Chronic  kidney disease    COPD (chronic obstructive pulmonary disease) (HCC)    DDD (degenerative disc disease), lumbar    Depression    Elevated lipids    Fibromyalgia    Fibromyalgia    Genetic testing 03/09/2017   Multi-Cancer panel (83 genes) @ [facility] - No pathogenic mutations detected   GERD (gastroesophageal reflux disease)    History of IBS    Hyperthyroidism    Joint disease    Migraine    Migraine    Oxygen deficiency    Personal history of chemotherapy    Personal history of radiation therapy    Restless leg syndrome    Sleep apnea    Does not use C-PAP, cannot tolerate mask   Past Surgical History:  Procedure Laterality Date   ABDOMINAL HYSTERECTOMY  2008   BREAST BIOPSY Left 02/02/2017   [place] core positive   BREAST LUMPECTOMY Left 02/17/2017   BREAST LUMPECTOMY WITH NEEDLE LOCALIZATION Left 02/17/2017   Procedure: BREAST LUMPECTOMY WITH NEEDLE LOCALIZATION;  Surgeon: Vickie Epley, MD;  Location: [facility];  Service: General;  Laterality: Left;   CARPAL TUNNEL RELEASE Bilateral    cryotherapy     DILATION AND CURETTAGE OF UTERUS     ENDOMETRIAL ABLATION     LAPAROSCOPIC OOPHERECTOMY Left    unsure which side but thinks  its the left   LYSIS OF ADHESION Left 10/14/2015   Procedure: LYSIS OF ADHESION;  Surgeon: Thornton Park, MD;  Location: [facility];  Service: Orthopedics;  Laterality: Left;   NECK SURGERY     lower neck fusion rods and screws   OOPHORECTOMY     one ovary removed    PORTA CATH INSERTION N/A 03/08/2017   Procedure: PORTA CATH INSERTION;  Surgeon: Katha Cabal, MD;  Location: [facility];  Service: Cardiovascular;  Laterality: N/A;   RESECTION DISTAL CLAVICAL Left 10/14/2015   Procedure: RESECTION DISTAL CLAVICAL;  Surgeon: Thornton Park, MD;  Location: [facility];  Service: Orthopedics;  Laterality: Left;   SENTINEL NODE BIOPSY Left 02/17/2017   Procedure: SENTINEL NODE BIOPSY;  Surgeon: Vickie Epley, MD;  Location: [facility];  Service:  General;  Laterality: Left;   SHOULDER ARTHROSCOPY WITH OPEN ROTATOR CUFF REPAIR Left 10/14/2015   Procedure: SHOULDER ARTHROSCOPY WITH OPEN ROTATOR CUFF REPAIR;  Surgeon: Thornton Park, MD;  Location: [facility];  Service: Orthopedics;  Laterality: Left;   SHOULDER ARTHROSCOPY WITH OPEN ROTATOR CUFF REPAIR AND DISTAL CLAVICLE ACROMINECTOMY Right 09/03/2014   Procedure: RIGHT SHOULDER ARTHROSCOPY WITH MINI OPEN ROTATOR CUFF TEAR;  Surgeon: Thornton Park, MD;  Location: [facility];  Service: Orthopedics;  Laterality: Right;  biceps tenodesis, arthroscopic subacromial decompression and distal clavicle incision   spinal injections     SUBACROMIAL DECOMPRESSION Left 10/14/2015   Procedure: SUBACROMIAL DECOMPRESSION;  Surgeon: Thornton Park, MD;  Location: [facility];  Service: Orthopedics;  Laterality: Left;   Family History  Problem Relation Age of Onset   Breast cancer Mother 37       Glioblastoma also; deceased at 108   Diabetes Father    Lung cancer Father        mesothelioma; deceased 55s   Cancer Maternal Aunt        "bone ca"; unk. primary   Colon cancer Maternal Grandfather        dx in 48s; deceased 50   Colon cancer Maternal Aunt        dx in 48s; currently 1s   Lung cancer Maternal Grandmother        smoker; deceased 91   Lung cancer Paternal Grandmother        smoker; deceased 33s   Breast cancer Cousin        dx 83s; daughter of mat aunt with unk. primary cancer   Cancer Other        distant cousin; unknown primary   Colon cancer Other        dx 27s; currently 33; maternal half-sister   Bipolar disorder Sister    Schizophrenia Sister    Social History   Socioeconomic History   Marital status: Divorced    Spouse name: Not on file   Number of children: 1   Years of education: Not on file   Highest education level: High school graduate  Occupational History    Comment: disabled  Tobacco Use   Smoking status: Every Day    Packs/day: 1.00    Types: Cigarettes    Smokeless tobacco: Never  Vaping Use   Vaping Use: Never used  Substance and Sexual Activity   Alcohol use: No    Alcohol/week: 0.0 standard drinks   Drug use: No   Sexual activity: Yes  Other Topics Concern   Not on file  Social History Narrative   Not on file   Social Determinants of Health  Financial Resource Strain: Not on file  Food Insecurity: Not on file  Transportation Needs: Not on file  Physical Activity: Not on file  Stress: Not on file  Social Connections: Not on file   Allergies  Allergen Reactions   Iodine    Bee Venom Hives and Rash   Cefoxitin Rash   Cefuroxime Axetil Hives and Rash   Cucumber Extract Rash   Gadolinium Derivatives Swelling, Other (See Comments) and Rash    NECK BECAME RED AND TONGUE WAS SWELLING SLIGHTLY PER PT NECK BECAME RED AND TONGUE WAS SWELLING SLIGHTLY PER PT NECK BECAME RED AND TONGUE WAS SWELLING SLIGHTLY PER PT   Iodinated Contrast Media Hives and Rash   Latex Rash   Other Hives and Rash    Bee Stings-swelling/hives/rash Wool (textile fiber)-Rash/itching   Tomato Rash    Red tomatoes    Medications   (Not in a hospital admission)    No current facility-administered medications for this encounter.  Current Outpatient Medications:    anastrozole (ARIMIDEX) 1 MG tablet, Take 1 tablet (1 mg total) by mouth daily., Disp: 90 tablet, Rfl: 3   baclofen (LIORESAL) 10 MG tablet, Take 10 mg by mouth 3 (three) times daily as needed for muscle spasms (typically 2-3 times daily). , Disp: , Rfl:    calcium carbonate (OS-CAL) 600 MG TABS tablet, Take by mouth., Disp: , Rfl:    cholecalciferol (VITAMIN D) 1000 UNITS tablet, Take 1,000 Units by mouth 2 (two) times daily. , Disp: , Rfl:    EPINEPHrine (EPIPEN JR) 0.15 MG/0.3ML injection, Inject 0.15 mg into the muscle as needed for anaphylaxis., Disp: , Rfl:    hydrocortisone 2.5 % cream, Apply 1 application topically 2 (two) times daily as needed (for itchy/dry skin.). (Patient not  taking: Reported on 10/30/2020), Disp: , Rfl:    Multiple Vitamins-Minerals (HAIR SKIN AND NAILS FORMULA) TABS, Take 2 tablets by mouth daily., Disp: , Rfl:    NARCAN 4 MG/0.1ML LIQD nasal spray kit, CALL 911. ADMINISTER A SINGLE SPRAY OF NARCAN IN ONE NOSTRIL. REPEAT EVERY 3 MINUTES AS NEEDED IF NO OR MINIMAL RESPONSE., Disp: , Rfl:    OXYCODONE HCL PO, Take 15 mg by mouth as needed. Take 4-6 times a day as needed., Disp: , Rfl:    pregabalin (LYRICA) 25 MG capsule, Take 1 capsule by mouth 2 (two) times daily., Disp: , Rfl:    promethazine (PHENERGAN) 25 MG tablet, Take 25 mg by mouth every 6 (six) hours as needed for nausea or vomiting., Disp: , Rfl:    rizatriptan (MAXALT-MLT) 10 MG disintegrating tablet, Take 10 mg by mouth as needed for migraine. May repeat in 2 hours if needed, Disp: , Rfl:    rOPINIRole (REQUIP) 1 MG tablet, Take 1.5 mg by mouth at bedtime., Disp: , Rfl:    rosuvastatin (CRESTOR) 40 MG tablet, Take 40 mg by mouth at bedtime. , Disp: , Rfl:    topiramate (TOPAMAX) 100 MG tablet, Take 200 mg by mouth daily. , Disp: , Rfl:    traZODone (DESYREL) 50 MG tablet, Take 50 mg by mouth at bedtime., Disp: , Rfl:    vitamin E 200 UNIT capsule, Take 200 Units by mouth 4 (four) times a week. Take 4-5 times a week, Disp: , Rfl:   Facility-Administered Medications Ordered in Other Encounters:    heparin lock flush 100 unit/mL, 500 Units, Intravenous, Once, Finnegan, Kathlene November, MD   sodium chloride flush (NS) 0.9 % injection 10 mL, 10 mL,  Intravenous, PRN, Lloyd Huger, MD, 10 mL at 08/21/17 1057  Vitals   Vitals:   02/20/21 1251 02/20/21 1305  Temp: 98.1 F (36.7 C)   TempSrc: Oral   Weight:  79.4 kg  Height:  '5\' 3"'  (1.6 m)     Body mass index is 31 kg/m.  Physical Exam   Physical Exam Gen: A&O x4, NAD HEENT: Atraumatic, normocephalic;mucous membranes moist; oropharynx clear, tongue without atrophy or fasciculations. Neck: Supple, trachea midline. Resp: CTAB, no  w/r/r CV: RRR, no m/g/r; nml S1 and S2. 2+ symmetric peripheral pulses. Abd: soft/NT/ND; nabs x 4 quad Extrem: Nml bulk; no cyanosis, clubbing, or edema.  Neuro: *MS: A&O x4. Follows multi-step commands.  *Speech: fluid, nondysarthric, able to name and repeat *CN:    I: Deferred   II,III: PERRLA, VFF by confrontation, optic discs unable to be visualized 2/2 pupillary constriction   III,IV,VI: EOMI w/o nystagmus, no ptosis   V: Sensation intact from V1 to V3 to LT   VII: Eyelid closure was full.  R UMN facial droop   VIII: Hearing intact to voice   IX,X: Voice normal, palate elevates symmetrically    XI: SCM/trap 5/5 bilat   XII: Tongue protrudes midline, no atrophy or fasciculations   *Motor:   Normal bulk.  No tremor, rigidity or bradykinesia. All extremities full strength and symmetric throughout by formal strength exam but she has very subtle RLE drift. *Sensory: Impaired to LT in RLE. No double-simultaneous extinction.  *Coordination:  Finger-to-nose, heel-to-shin, rapid alternating motions were intact. *Reflexes:  2+ and symmetric throughout without clonus; toes down-going bilat *Gait: deferred  NIHSS = 3 for R facial droop, RLE drift, and RLE sensory deficit.   Premorbid mRS = 1   Labs   CBC: No results for input(s): WBC, NEUTROABS, HGB, HCT, MCV, PLT in the last 168 hours.  Basic Metabolic Panel:  Lab Results  Component Value Date   NA 141 09/11/2017   K 3.8 09/11/2017   CO2 21 (L) 09/11/2017   GLUCOSE 84 09/11/2017   BUN 25 (H) 09/11/2017   CREATININE 1.04 (H) 09/11/2017   CALCIUM 9.5 09/11/2017   GFRNONAA >60 09/11/2017   GFRAA >60 09/11/2017   Lipid Panel:  Lab Results  Component Value Date   LDLCALC 56 03/28/2017   HgbA1c: No results found for: HGBA1C Urine Drug Screen: No results found for: LABOPIA, COCAINSCRNUR, LABBENZ, AMPHETMU, THCU, LABBARB  Alcohol Level No results found for: ETH   Impression   This is a 56 year old woman with a history of  breast cancer 2019, CKD, COPD, hyperlipidemia, migraine, prior hemorrhagic left basal ganglia infarct and recent acute to subacute infarcts 4 days ago on MRI who presents with new onset right-sided weakness and numbness since 9 AM this morning c/f recurrent ischemic infarcts. Her infarcts on MRI brain 1/19 are very small but are in both the anterior and posterior circulations and are c/f central embolic source. Patient is adamant she does not want to take ASA bc her kidney doctor told her not to in the setting of CKD even though I told her in the setting of recurrent acute strokes I strongly felt the benefit outweighed the risk. Will put her on plavix for now; ideally she would be on DAPT for at least the next 3 weeks. She c/o bilateral leg pain and so I ordered BLE dopplers r/o DVT.   Recommendations   - Admit to hospitalist service for stroke w/u - Permissive HTN x48 hrs from sx  onset (0900 02/20/21) MRI goal BP <220/110. PRN labetalol or hydralazine if BP above these parameters. Avoid oral antihypertensives. - MRI brain wo contrast - repeat given new sx - MRA neck wo contrast - No indication for repeat MRA head - no significant intracranial stenoses on MRA head 1/19 - TTE w/ bubble - BLE doppers r/o DVT - Check A1c and LDL + add statin per guidelines - Patient is adamant she does not want to take ASA bc her kidney doctor told her not to in the setting of CKD even though I told her in the setting of recurrent acute strokes I strongly felt the benefit outweighed the risk. Will put her on plavix for now; ideally she would be on plavix 92m daily + ASA 819mdaily x21 days f/b either agent monotherapy after that. - q4 hr neuro checks - STAT head CT for any change in neuro exam - Tele - PT/OT/SLP - Stroke education - F/u with established outpatient neurologist after discharge   ______________________________________________________________________   Thank you for the opportunity to take part in  the care of this patient. If you have any further questions, please contact the neurology consultation attending.  Signed,  CoSu MonksMD [facility] [phone] 7pm- 7am, please page neurology on call as listed in [facility]

## 2021-02-21 DIAGNOSIS — C50412 Malignant neoplasm of upper-outer quadrant of left female breast: Secondary | ICD-10-CM

## 2021-02-21 DIAGNOSIS — I639 Cerebral infarction, unspecified: Secondary | ICD-10-CM

## 2021-02-21 DIAGNOSIS — G2581 Restless legs syndrome: Secondary | ICD-10-CM

## 2021-02-21 DIAGNOSIS — G894 Chronic pain syndrome: Secondary | ICD-10-CM

## 2021-02-21 DIAGNOSIS — Z170 Estrogen receptor positive status [ER+]: Secondary | ICD-10-CM

## 2021-02-21 LAB — LIPID PANEL
Cholesterol: 91 mg/dL (ref 0–200)
HDL: 32 mg/dL — ABNORMAL LOW (ref >40)
LDL Cholesterol: 38 mg/dL (ref 0–99)
Total CHOL/HDL Ratio: 2.8 RATIO
Triglycerides: 104 mg/dL (ref <150)
VLDL: 21 mg/dL (ref 0–40)

## 2021-02-21 LAB — HIV ANTIBODY (ROUTINE TESTING W REFLEX): HIV Screen 4th Generation wRfx: NONREACTIVE

## 2021-02-21 LAB — LDL CHOLESTEROL, DIRECT: Direct LDL: 46.8 mg/dL (ref 0–99)

## 2021-02-21 MED ORDER — ALBUTEROL SULFATE (2.5 MG/3ML) 0.083% IN NEBU: 2.5000 mg | INHALATION_SOLUTION | Freq: Four times a day (QID) | RESPIRATORY_TRACT | Status: DC

## 2021-02-21 MED ORDER — IPRATROPIUM-ALBUTEROL 0.5-2.5 (3) MG/3ML IN SOLN: 3.0000 mL | Freq: Four times a day (QID) | RESPIRATORY_TRACT | Status: DC | PRN

## 2021-02-21 MED ORDER — OXYCODONE HCL 5 MG PO TABS
15.0000 mg | ORAL_TABLET | Freq: Four times a day (QID) | ORAL | Status: DC | PRN
  Administered 2021-02-21 – 2021-02-22 (×3): 15 mg via ORAL
  Filled 2021-02-21 (×3): qty 3

## 2021-02-21 MED ORDER — ALBUTEROL SULFATE HFA 108 (90 BASE) MCG/ACT IN AERS
2.0000 | INHALATION_SPRAY | Freq: Four times a day (QID) | RESPIRATORY_TRACT | Status: DC
  Administered 2021-02-21 (×3): 2 via RESPIRATORY_TRACT
  Filled 2021-02-21: qty 6.7

## 2021-02-21 NOTE — Consult Note (Signed)
[facility] Cardiology Consultation Note  Patient ID: Briana [name], MRN: [id number], DOB/AGE: October 04, 1965 56 y.o. Admit date: 02/20/2021   Date of Consult: 02/21/2021 Primary Physician: [name], MD Primary Cardiologist: Ubaldo Glassing  Chief Complaint:  Chief Complaint  Patient presents with   Numbness   Reason for Consult:  Stroke  HPI: 56 y.o. 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  The patient has recovered from that but still has had some issues with shortness of breath.  She has new issues with ocular stroke and significant concerns for other dizziness.  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  This is concerning for some possible embolic phenomenon.  We have discussed at length medication management for further risk reduction in atherosclerosis hypertension but also for further treatment options for embolic phenomenon including transesophageal echocardiogram as well as the possibility of further rhythm disturbance assessment  Past Medical History:  Diagnosis Date   Abnormal Pap smear of cervix    01/2015 ascus/neg- 04/2015 ascus/neg   Allergy    Anxiety    Arthritis    Asthma    Breast cancer ([place]) 2019   Chronic kidney disease    COPD (chronic obstructive pulmonary disease) ([place])    DDD (degenerative disc disease), lumbar    Depression    Elevated lipids    Fibromyalgia    Fibromyalgia    Genetic testing 03/09/2017   Multi-Cancer panel (83 genes) @ [facility] - No pathogenic mutations detected   GERD (gastroesophageal reflux disease)    History of IBS    Hyperthyroidism    Joint disease    Migraine    Migraine    Oxygen deficiency    Personal history of chemotherapy    Personal history of radiation therapy    Restless leg syndrome    Sleep apnea    Does not use C-PAP, cannot  tolerate mask      Surgical History:  Past Surgical History:  Procedure Laterality Date   ABDOMINAL HYSTERECTOMY  2008   BREAST BIOPSY Left 02/02/2017   [place] core positive   BREAST LUMPECTOMY Left 02/17/2017   BREAST LUMPECTOMY WITH NEEDLE LOCALIZATION Left 02/17/2017   Procedure: BREAST LUMPECTOMY WITH NEEDLE LOCALIZATION;  Surgeon: Vickie Epley, MD;  Location: [facility];  Service: General;  Laterality: Left;   CARPAL TUNNEL RELEASE Bilateral    cryotherapy     DILATION AND CURETTAGE OF UTERUS     ENDOMETRIAL ABLATION     LAPAROSCOPIC OOPHERECTOMY Left    unsure which side but thinks its the left   LYSIS OF ADHESION Left 10/14/2015   Procedure: LYSIS OF ADHESION;  Surgeon: Thornton Park, MD;  Location: [facility];  Service: Orthopedics;  Laterality: Left;   NECK SURGERY     lower neck fusion rods and screws   OOPHORECTOMY     one ovary removed    PORTA CATH INSERTION N/A 03/08/2017   Procedure: PORTA CATH INSERTION;  Surgeon: Katha Cabal, MD;  Location: [place];  Service: Cardiovascular;  Laterality: N/A;   RESECTION DISTAL CLAVICAL Left 10/14/2015   Procedure: RESECTION DISTAL CLAVICAL;  Surgeon: Thornton Park, MD;  Location: [facility];  Service: Orthopedics;  Laterality: Left;   SENTINEL NODE BIOPSY Left 02/17/2017   Procedure: SENTINEL NODE BIOPSY;  Surgeon: Vickie Epley, MD;  Location: [facility];  Service: General;  Laterality: Left;   SHOULDER ARTHROSCOPY WITH OPEN ROTATOR CUFF REPAIR Left 10/14/2015   Procedure: SHOULDER ARTHROSCOPY WITH OPEN ROTATOR CUFF REPAIR;  Surgeon: Thornton Park, MD;  Location: [facility];  Service: Orthopedics;  Laterality: Left;   SHOULDER ARTHROSCOPY WITH OPEN ROTATOR CUFF REPAIR AND DISTAL CLAVICLE ACROMINECTOMY Right 09/03/2014   Procedure: RIGHT SHOULDER ARTHROSCOPY WITH MINI OPEN ROTATOR CUFF TEAR;  Surgeon: Thornton Park, MD;  Location: [facility];  Service: Orthopedics;  Laterality: Right;  biceps tenodesis, arthroscopic  subacromial decompression and distal clavicle incision   spinal injections     SUBACROMIAL DECOMPRESSION Left 10/14/2015   Procedure: SUBACROMIAL DECOMPRESSION;  Surgeon: Thornton Park, MD;  Location: [facility];  Service: Orthopedics;  Laterality: Left;     Home Meds: Prior to Admission medications   Medication Sig Start Date End Date Taking? Authorizing Provider  anastrozole (ARIMIDEX) 1 MG tablet Take 1 tablet (1 mg total) by mouth daily. 03/12/20  Yes Chrystal, Eulas Post, MD  baclofen (LIORESAL) 10 MG tablet Take 10 mg by mouth 3 (three) times daily as needed for muscle spasms (typically 2-3 times daily).    Yes [provider]  calcium carbonate (OS-CAL) 600 MG TABS tablet Take by mouth.   Yes [provider]  cholecalciferol (VITAMIN D) 1000 UNITS tablet Take 1,000 Units by mouth 2 (two) times daily.    Yes [provider]  diclofenac Sodium (VOLTAREN) 1 % GEL SMARTSIG:2-4 Gram(s) Topical 4 Times Daily 02/02/21  Yes [provider]  lidocaine (LIDODERM) 5 % SMARTSIG:Topical 01/26/21  Yes [provider]  Multiple Vitamins-Minerals (HAIR SKIN AND NAILS FORMULA) TABS Take 2 tablets by mouth daily.   Yes [provider]  OXYCODONE HCL PO Take 15 mg by mouth as needed. Take 4-6 times a day as needed.   Yes [provider]  pregabalin (LYRICA) 25 MG capsule Take 1 capsule by mouth 2 (two) times daily. 04/01/19  Yes [provider]  promethazine (PHENERGAN) 25 MG tablet Take 25 mg by mouth every 6 (six) hours as needed for nausea or vomiting.   Yes [provider]  rizatriptan (MAXALT-MLT) 10 MG disintegrating tablet Take 10 mg by mouth as needed for migraine. May repeat in 2 hours if needed   Yes Jennings Books K, MD  rOPINIRole (REQUIP) 1 MG tablet Take 1.5 mg by mouth at bedtime. 10/08/19  Yes [provider]  rosuvastatin (CRESTOR) 40 MG tablet Take 40 mg by mouth at bedtime.  08/07/15  Yes [provider]   topiramate (TOPAMAX) 100 MG tablet Take 200 mg by mouth daily.    Yes [provider]  traZODone (DESYREL) 50 MG tablet Take 50 mg by mouth at bedtime.   Yes [provider]  vitamin E 200 UNIT capsule Take 200 Units by mouth daily.   Yes [provider]  EPINEPHrine (EPIPEN JR) 0.15 MG/0.3ML injection Inject 0.15 mg into the muscle as needed for anaphylaxis.    [provider]  hydrocortisone 2.5 % cream Apply 1 application topically 2 (two) times daily as needed (for itchy/dry skin.). Patient not taking: Reported on 10/30/2020    [provider]  NARCAN 4 MG/0.1ML LIQD nasal spray kit CALL 911. ADMINISTER A SINGLE SPRAY OF NARCAN IN ONE NOSTRIL. REPEAT EVERY 3 MINUTES AS NEEDED IF NO OR MINIMAL RESPONSE. 02/11/19   [provider]    Inpatient Medications:    stroke: mapping our early stages of recovery book   Does not apply Once  albuterol  2 puff Inhalation Q6H   anastrozole  1 mg Oral Daily   aspirin EC  81 mg Oral Daily   calcium carbonate  1,250 mg Oral Q breakfast   cholecalciferol  1,000 Units Oral BID   clopidogrel  75 mg Oral Daily   enoxaparin (LOVENOX) injection  40 mg Subcutaneous Q24H   pregabalin  25 mg Oral BID   rOPINIRole  1.5 mg Oral QHS   rosuvastatin  40 mg Oral QHS   topiramate  200 mg Oral Daily   traZODone  50 mg Oral QHS   vitamin E  200 Units Oral Once per day on Sun Tue Thu Sat    sodium chloride 100 mL/hr at 02/21/21 4536    Allergies:  Allergies  Allergen Reactions   Iodine    Bee Venom Hives and Rash   Cefoxitin Rash   Cefuroxime Axetil Hives and Rash   Cucumber Extract Rash   Gadolinium Derivatives Swelling, Other (See Comments) and Rash    NECK BECAME RED AND TONGUE WAS SWELLING SLIGHTLY PER PT NECK BECAME RED AND TONGUE WAS SWELLING SLIGHTLY PER PT NECK BECAME RED AND TONGUE WAS SWELLING SLIGHTLY PER PT   Iodinated Contrast Media Hives and Rash   Latex Rash   Other Hives and Rash    Bee  Stings-swelling/hives/rash Wool (textile fiber)-Rash/itching   Tomato Rash    Red tomatoes    Social History   Socioeconomic History   Marital status: Divorced    Spouse name: Not on file   Number of children: 1   Years of education: Not on file   Highest education level: High school graduate  Occupational History    Comment: disabled  Tobacco Use   Smoking status: Every Day    Packs/day: 1.00    Types: Cigarettes   Smokeless tobacco: Never  Vaping Use   Vaping Use: Never used  Substance and Sexual Activity   Alcohol use: No    Alcohol/week: 0.0 standard drinks   Drug use: No   Sexual activity: Yes  Other Topics Concern   Not on file  Social History Narrative   Not on file   Social Determinants of Health   Financial Resource Strain: Not on file  Food Insecurity: Not on file  Transportation Needs: Not on file  Physical Activity: Not on file  Stress: Not on file  Social Connections: Not on file  Intimate Partner Violence: Not on file     Family History  Problem Relation Age of Onset   Breast cancer Mother 33       Glioblastoma also; deceased at 1   Diabetes Father    Lung cancer Father        mesothelioma; deceased 61s   Cancer Maternal Aunt        "bone ca"; unk. primary   Colon cancer Maternal Grandfather        dx in 37s; deceased 75   Colon cancer Maternal Aunt        dx in 62s; currently 33s   Lung cancer Maternal Grandmother        smoker; deceased 57   Lung cancer Paternal Grandmother        smoker; deceased 47s   Breast cancer Cousin        dx 61s; daughter of mat aunt with unk. primary cancer   Cancer Other        distant cousin; unknown primary   Colon cancer Other  dx 24s; currently 49; maternal half-sister   Bipolar disorder Sister    Schizophrenia Sister      Review of Systems Positive for shortness of breath Negative for: General:  chills, fever, night sweats or weight changes.  Cardiovascular: PND orthopnea syncope  dizziness  Dermatological skin lesions rashes Respiratory: Cough congestion Urologic: Frequent urination urination at night and hematuria Abdominal: negative for nausea, vomiting, diarrhea, bright red blood per rectum, melena, or hematemesis Neurologic: negative for visual changes, and/or hearing changes  All other systems reviewed and are otherwise negative except as noted above.  Labs: No results for input(s): CKTOTAL, CKMB, TROPONINI in the last 72 hours. Lab Results  Component Value Date   WBC 9.6 02/20/2021   HGB 13.7 02/20/2021   HCT 42.3 02/20/2021   MCV 99.5 02/20/2021   PLT 196 02/20/2021    Recent Labs  Lab 02/20/21 1337  NA 137  K 3.8  CL 106  CO2 23  BUN 14  CREATININE 1.05*  CALCIUM 9.2  PROT 7.4  BILITOT 0.3  ALKPHOS 84  ALT 11  AST 14*  GLUCOSE 98   Lab Results  Component Value Date   CHOL 91 02/21/2021   HDL 32 (L) 02/21/2021   LDLCALC 38 02/21/2021   TRIG 104 02/21/2021   No results found for: DDIMER  Radiology/Studies:  CT HEAD WO CONTRAST (5MM)  Result Date: 02/20/2021 CLINICAL DATA:  Acute neuro deficit.  Right-sided numbness EXAM: CT HEAD WITHOUT CONTRAST TECHNIQUE: Contiguous axial images were obtained from the base of the skull through the vertex without intravenous contrast. RADIATION DOSE REDUCTION: This exam was performed according to the departmental dose-optimization program which includes automated exposure control, adjustment of the mA and/or kV according to patient size and/or use of iterative reconstruction technique. COMPARISON:  MRI head 02/18/2021 FINDINGS: Brain: Several small areas of restricted diffusion in the left cerebellum and left cerebral hemisphere on MRI are not identified on CT. No acute cortical infarct or hemorrhage by CT. No mass Chronic infarct left lenticular nuclei unchanged. Ventricle size and cerebral volume normal. Vascular: Negative for hyperdense vessel Skull: Negative Sinuses/Orbits: Paranasal sinuses clear.   Negative orbit Other: None IMPRESSION: Negative for acute infarct or hemorrhage Chronic infarct left anterior basal ganglia. Multiple small areas of acute infarct noted on recent MRI. These results were called by telephone at the time of interpretation on 02/20/2021 at 1:20 pm to provider [facility] , who verbally acknowledged these results. Electronically Signed   By: Franchot Gallo M.D.   On: 02/20/2021 13:21   MR ANGIO HEAD WO CONTRAST  Result Date: 02/18/2021 CLINICAL DATA:  Initial evaluation for episodic right-sided visual loss. EXAM: MRI HEAD WITHOUT CONTRAST MRA HEAD WITHOUT CONTRAST TECHNIQUE: Multiplanar, multi-echo pulse sequences of the brain and surrounding structures were acquired without intravenous contrast. Angiographic images of the Circle of Willis were acquired using MRA technique without intravenous contrast. COMPARISON:  Prior MRI from 01/29/2019. FINDINGS: MRI HEAD FINDINGS Brain: Cerebral volume within normal limits. Chronic hemorrhagic lacunar infarct present at the left basal ganglia. No other significant cerebral white matter disease for age. There are a few scattered subtle punctate foci of diffusion abnormality involving the left cerebral hemisphere (series 5, images 40, 35, and 32). Additional small focus noted involving the left cerebellum (series 5, image 15). Findings consistent with tiny acute to early subacute ischemic infarcts. These are likely embolic in nature. No associated hemorrhage or mass effect. Gray-white matter differentiation otherwise maintained. No other areas of chronic cortical infarction.  No other acute or chronic blood products seen within the brain. No mass lesion, midline shift or mass effect. No hydrocephalus or extra-axial fluid collection. Pituitary gland suprasellar region normal. Midline structures intact. Vascular: Major intracranial vascular flow voids are maintained. Skull and upper cervical spine: Craniocervical junction within normal limits. Bone  marrow signal intensity normal. No scalp soft tissue abnormality. Sinuses/Orbits: Globes and orbital soft tissues demonstrate no acute finding. Paranasal sinuses are clear. No significant mastoid effusion. Inner ear structures grossly normal. Other: None. MRA HEAD FINDINGS Anterior circulation: Visualized distal cervical segments of the internal carotid arteries are widely patent with antegrade flow. Petrous, cavernous, and supraclinoid segments patent without stenosis or other abnormality. Left A1 segment widely patent. Right A1 hypoplastic and/or absent, accounting for the slightly diminutive right ICA is compared to the left. Normal anterior communicating artery complex. Anterior cerebral arteries widely patent without stenosis. No M1 stenosis or occlusion. Normal MCA bifurcations. Distal MCA branches well perfused and symmetric. Posterior circulation: Vertebral arteries are largely code dominant and widely patent to the vertebrobasilar junction. Both PICA origins patent and normal. Basilar patent to its distal aspect without stenosis. Superior cerebellar arteries patent bilaterally. Left PCA primarily supplied via the basilar, although a hypoplastic left posterior communicating artery noted as well. Fetal type origin of the right PCA. Both PCAs well perfused or distal aspects without stenosis. Anatomic variants: Fetal type origin of the right PCA. No aneurysm or other vascular malformation. IMPRESSION: MRI HEAD IMPRESSION: 1. Few scattered punctate foci of diffusion abnormality involving the left cerebral hemisphere and left cerebellum, consistent with tiny acute to early subacute ischemic infarcts. No associated hemorrhage or mass effect. These are likely embolic in nature. 2. Chronic hemorrhagic lacunar infarct at the left basal ganglia. MRA HEAD IMPRESSION: 1. Normal intracranial MRA. No large vessel occlusion or hemodynamically significant stenosis. 2. Fetal type origin of the right PCA. These results will  be called to the ordering clinician or representative by the Radiologist Assistant, and communication documented in the PACS or [employer]. Electronically Signed   By: Jeannine Boga M.D.   On: 02/18/2021 19:02   MR ANGIO NECK WO CONTRAST  Result Date: 02/20/2021 CLINICAL DATA:  Acute neuro deficit. Right-sided weakness and numbness. EXAM: MRI HEAD WITHOUT CONTRAST MRA NECK WITHOUT  CONTRAST TECHNIQUE: Multiplanar, multi-echo pulse sequences of the brain and surrounding structures were acquired without intravenous contrast. Angiographic images of the neck were acquired using MRA technique without intravenous contrast. Carotid stenosis measurements (when applicable) are obtained utilizing NASCET criteria, using the distal internal carotid diameter as the denominator. COMPARISON:  MRI head 02/18/2021 FINDINGS: MRI HEAD FINDINGS Brain: Acute infarct in the right anterior cerebellar vermis is new from the prior study. No change in small areas of restricted diffusion left cerebellum and left frontal and parietal lobes compatible with acute infarct. Chronic infarct left anterior basal ganglia. Ventricle size normal. No hemorrhage or mass. Vascular: Normal arterial flow voids Skull and upper cervical spine: No focal skeletal lesion. Sinuses/Orbits: Paranasal sinuses clear.  Negative orbit Other: None MRA NECK FINDINGS Aortic arch: Antegrade flow in the carotid and vertebral arteries bilaterally. Carotid bifurcation is widely patent bilaterally without stenosis Proximal vertebral arteries are not well seen due to artifact and motion. Mid and distal vertebral arteries are patent bilaterally. IMPRESSION: 1. Subcentimeter acute infarct right anterior cerebellar vermis is new since the MRI 2 days ago. No change in multiple small embolic infarcts in the left cerebral hemisphere and left cerebellum. 2. Chronic infarct left anterior  basal ganglia unchanged 3. Negative MRA neck without contrast. Electronically  Signed   By: Franchot Gallo M.D.   On: 02/20/2021 15:07   MR BRAIN WO CONTRAST  Result Date: 02/20/2021 CLINICAL DATA:  Acute neuro deficit. Right-sided weakness and numbness. EXAM: MRI HEAD WITHOUT CONTRAST MRA NECK WITHOUT  CONTRAST TECHNIQUE: Multiplanar, multi-echo pulse sequences of the brain and surrounding structures were acquired without intravenous contrast. Angiographic images of the neck were acquired using MRA technique without intravenous contrast. Carotid stenosis measurements (when applicable) are obtained utilizing NASCET criteria, using the distal internal carotid diameter as the denominator. COMPARISON:  MRI head 02/18/2021 FINDINGS: MRI HEAD FINDINGS Brain: Acute infarct in the right anterior cerebellar vermis is new from the prior study. No change in small areas of restricted diffusion left cerebellum and left frontal and parietal lobes compatible with acute infarct. Chronic infarct left anterior basal ganglia. Ventricle size normal. No hemorrhage or mass. Vascular: Normal arterial flow voids Skull and upper cervical spine: No focal skeletal lesion. Sinuses/Orbits: Paranasal sinuses clear.  Negative orbit Other: None MRA NECK FINDINGS Aortic arch: Antegrade flow in the carotid and vertebral arteries bilaterally. Carotid bifurcation is widely patent bilaterally without stenosis Proximal vertebral arteries are not well seen due to artifact and motion. Mid and distal vertebral arteries are patent bilaterally. IMPRESSION: 1. Subcentimeter acute infarct right anterior cerebellar vermis is new since the MRI 2 days ago. No change in multiple small embolic infarcts in the left cerebral hemisphere and left cerebellum. 2. Chronic infarct left anterior basal ganglia unchanged 3. Negative MRA neck without contrast. Electronically Signed   By: Franchot Gallo M.D.   On: 02/20/2021 15:07   MR BRAIN WO CONTRAST  Result Date: 02/18/2021 CLINICAL DATA:  Initial evaluation for episodic right-sided visual  loss. EXAM: MRI HEAD WITHOUT CONTRAST MRA HEAD WITHOUT CONTRAST TECHNIQUE: Multiplanar, multi-echo pulse sequences of the brain and surrounding structures were acquired without intravenous contrast. Angiographic images of the Circle of Willis were acquired using MRA technique without intravenous contrast. COMPARISON:  Prior MRI from 01/29/2019. FINDINGS: MRI HEAD FINDINGS Brain: Cerebral volume within normal limits. Chronic hemorrhagic lacunar infarct present at the left basal ganglia. No other significant cerebral white matter disease for age. There are a few scattered subtle punctate foci of diffusion abnormality involving the left cerebral hemisphere (series 5, images 40, 35, and 32). Additional small focus noted involving the left cerebellum (series 5, image 15). Findings consistent with tiny acute to early subacute ischemic infarcts. These are likely embolic in nature. No associated hemorrhage or mass effect. Gray-white matter differentiation otherwise maintained. No other areas of chronic cortical infarction. No other acute or chronic blood products seen within the brain. No mass lesion, midline shift or mass effect. No hydrocephalus or extra-axial fluid collection. Pituitary gland suprasellar region normal. Midline structures intact. Vascular: Major intracranial vascular flow voids are maintained. Skull and upper cervical spine: Craniocervical junction within normal limits. Bone marrow signal intensity normal. No scalp soft tissue abnormality. Sinuses/Orbits: Globes and orbital soft tissues demonstrate no acute finding. Paranasal sinuses are clear. No significant mastoid effusion. Inner ear structures grossly normal. Other: None. MRA HEAD FINDINGS Anterior circulation: Visualized distal cervical segments of the internal carotid arteries are widely patent with antegrade flow. Petrous, cavernous, and supraclinoid segments patent without stenosis or other abnormality. Left A1 segment widely patent. Right A1  hypoplastic and/or absent, accounting for the slightly diminutive right ICA is compared to the left. Normal anterior communicating artery complex. Anterior cerebral arteries widely patent without  stenosis. No M1 stenosis or occlusion. Normal MCA bifurcations. Distal MCA branches well perfused and symmetric. Posterior circulation: Vertebral arteries are largely code dominant and widely patent to the vertebrobasilar junction. Both PICA origins patent and normal. Basilar patent to its distal aspect without stenosis. Superior cerebellar arteries patent bilaterally. Left PCA primarily supplied via the basilar, although a hypoplastic left posterior communicating artery noted as well. Fetal type origin of the right PCA. Both PCAs well perfused or distal aspects without stenosis. Anatomic variants: Fetal type origin of the right PCA. No aneurysm or other vascular malformation. IMPRESSION: MRI HEAD IMPRESSION: 1. Few scattered punctate foci of diffusion abnormality involving the left cerebral hemisphere and left cerebellum, consistent with tiny acute to early subacute ischemic infarcts. No associated hemorrhage or mass effect. These are likely embolic in nature. 2. Chronic hemorrhagic lacunar infarct at the left basal ganglia. MRA HEAD IMPRESSION: 1. Normal intracranial MRA. No large vessel occlusion or hemodynamically significant stenosis. 2. Fetal type origin of the right PCA. These results will be called to the ordering clinician or representative by the Radiologist Assistant, and communication documented in the PACS or [employer]. Electronically Signed   By: Jeannine Boga M.D.   On: 02/18/2021 19:02   US Venous Img Lower Bilateral (DVT)  Result Date: 02/20/2021 CLINICAL DATA:  Bilateral leg pain and swelling EXAM: BILATERAL LOWER EXTREMITY VENOUS DOPPLER ULTRASOUND TECHNIQUE: Gray-scale sonography with compression, as well as color and duplex ultrasound, were performed to evaluate the deep venous  system(s) from the level of the common femoral vein through the popliteal and proximal calf veins. COMPARISON:  None. FINDINGS: VENOUS Normal compressibility of the common femoral, superficial femoral, and popliteal veins, as well as the visualized calf veins. Visualized portions of profunda femoral vein and great saphenous vein unremarkable. No filling defects to suggest DVT on grayscale or color Doppler imaging. Doppler waveforms show normal direction of venous flow, normal respiratory plasticity and response to augmentation. OTHER None. Limitations: none IMPRESSION: No findings of lower extremity DVT. Electronically Signed   By: Van Clines M.D.   On: 02/20/2021 15:09    EKG: Normal sinus rhythm  Weights: Filed Weights   02/20/21 1305 02/20/21 2221  Weight: 79.4 kg 77.8 kg     Physical Exam: Blood pressure 121/70, pulse 83, temperature 97.8 F (36.6 C), temperature source Oral, resp. rate 16, height _0  (1.6 m), weight 77.8 kg, last menstrual period 08/24/2008, SpO2 100 %. Body mass index is 30.38 kg/m. General: Well developed, well nourished, in no acute distress. Head eyes ears nose throat: Normocephalic, atraumatic, sclera non-icteric, no xanthomas, nares are without discharge. No apparent thyromegaly and/or mass  Lungs: Normal respiratory effort.  no wheezes, no rales, no rhonchi.  Heart: RRR with normal S1 S2. no murmur gallop, no rub, PMI is normal size and placement, carotid upstroke normal without bruit, jugular venous pressure is normal Abdomen: Soft, non-tender, non-distended with normoactive bowel sounds. No hepatomegaly. No rebound/guarding. No obvious abdominal masses. Abdominal aorta is normal size without bruit Extremities: No edema. no cyanosis, no clubbing, no ulcers  Peripheral : 2+ bilateral upper extremity pulses, 2+ bilateral femoral pulses, 2+ bilateral dorsal pedal pulse Neuro: Alert and oriented. No facial asymmetry. No focal deficit. Moves all extremities  spontaneously. Musculoskeletal: Normal muscle tone without kyphosis Psych:  Responds to questions appropriately with a normal affect.    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  Continue medication management for  hypertension hyperlipidemia and dual antiplatelet therapy for recent stroke 2.  Continue telemetry for possible atrial fibrillation or other cause for embolic phenomenon 3.  Proceed to transesophageal echocardiogram for further evaluation of potential for embolic phenomenon for stroke.  Patient understands the risk and benefits of transesophageal echocardiogram.  This includes the possibility of death stroke esophageal perforation sore throat and side effects of medication management and anesthesia.  Patient is at low risk for moderate sedation  Signed, [name] M.D. [facility] 02/21/2021, 3:10 PM

## 2021-02-21 NOTE — Progress Notes (Signed)
°  Progress Note   Patient: Briana [name] HMC:[id number] DOB: 1965-06-20 DOA: 02/20/2021     1 DOS: the patient was seen and examined on 02/21/2021   Brief hospital course: 56 year old female with a known history of asthma, COPD, depression, fibromyalgia, IBS, migraine, sleep apnea and RLS admitted for stroke  1/22: Getting stroke work-up.  Pending echo, may need TEE.  Neurology following   Assessment and Plan * CVA (cerebral vascular accident) ([place])- (present on admission) Aspirin and Plavix for 3 weeks followed by monotherapy with either agent, high intensity statin per neurology.  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  Lower extremity Dopplers negative for DVT  PT, OT eval  Malignant neoplasm of upper-outer quadrant of left breast in female, estrogen receptor positive (HCC) Continue Arimidex  Restless leg- (present on admission) Continue Requip  Chronic pain associated with significant psychosocial dysfunction- (present on admission) Continue her home narcotic regimen  Bipolar disorder ([place])- (present on admission) Continue Topamax   Subjective: Agreeable to take aspirin and Plavix as recommended by neurology with some reservation.  Wants to go home  Objective Vital signs were reviewed and unremarkable.  GENERAL:  56 y.o.-year-old Caucasian female patient lying in the bed with no acute distress.  EYES: Pupils equal, round, reactive to light and accommodation. No scleral icterus. Extraocular muscles intact.  HEENT: Head atraumatic, normocephalic. Oropharynx and nasopharynx clear.  NECK:  Supple, no jugular venous distention. No thyroid enlargement, no tenderness.  LUNGS: Normal breath sounds bilaterally, no wheezing, rales,rhonchi or crepitation. No use of accessory muscles of respiration.  CARDIOVASCULAR: Regular rate and rhythm, S1, S2 normal. No murmurs, rubs, or gallops.  ABDOMEN: Soft, nondistended,  nontender. Bowel sounds present. No organomegaly or mass.  EXTREMITIES: No pedal edema, cyanosis, or clubbing.  NEUROLOGIC: Right facial droop, right lower extremity drift and sensory deficit present PSYCHIATRIC: The patient is alert and oriented x 3.  Normal affect and good eye contact. SKIN: No obvious rash, lesion, or ulcer.   Data Reviewed: MRI does confirm findings concerning for embolic stroke My review of labs, imaging, notes and other tests shows no new significant findings. Except as above MRI findings  Family Communication: None  Disposition: Status is: Inpatient  Remains inpatient appropriate because: Getting further stroke work-up   DVT prophylaxis-Lovenox   Time spent: 35 minutes  Author: [name], MD 02/21/2021 1:04 PM  For on call review [email or web address].

## 2021-02-21 NOTE — Assessment & Plan Note (Signed)
Continue her home narcotic regimen

## 2021-02-21 NOTE — Assessment & Plan Note (Signed)
Continue Topamax.

## 2021-02-21 NOTE — Evaluation (Signed)
Occupational Therapy Evaluation Patient Details Name: KYMBER KOSAR MRN: [id number] DOB: 1965-02-18 Today's Date: 02/21/2021   History of Present Illness 56 y.o. female with medical history significant for asthma, COPD, depression, fibromyalgia, IBS, breast cancer, prior hemorrhagic left basal ganglia infarct, sleep apnea, and RLS, who presented to the ER with acute onset of right upper extremity weakness and numbness and right leg weakness. Work-up reveals acute right anterior cerebral vermis lacunar infarct and recent multiple small infarcts (likely central embolic source) in the left cerebral and cerebellar hemisphere.   Clinical Impression   Pt seen for OT evaluation this date. Prior to hospital admission, pt was independent in all aspects of ADL/IADL, living alone in a 1-story home with 6 steps to enter. At baseline, pt drives and grocery shops. Pt demonstrates baseline independence to perform ADLs and mobility tasks, however endorses intermittently seeing white flashes in peripheral visual field. Pt educated on importance of driving cessation for first few months following stroke; pt verbalized understanding and reported that she has friends that can provide transportation following discharge however would like to resume driving in the future. No strength, sensory, coordination, or cognitive appreciated with assessment. Upon discharge, recommend outpatient OT (with focus on driver rehabilitation). No additional acute OT needs identified at this time. OT will sign off. Please re-consult if additional OT needs arise.      Recommendations for follow up therapy are one component of a multi-disciplinary discharge planning process, led by the attending physician.  Recommendations may be updated based on patient status, additional functional criteria and insurance authorization.   Follow Up Recommendations  Outpatient OT ((driver rehabilitation))    Assistance Recommended at Discharge PRN  Patient  can return home with the following Assist for transportation    Functional Status Assessment  Patient has had a recent decline in their functional status and demonstrates the ability to make significant improvements in function in a reasonable and predictable amount of time.  Equipment Recommendations  None recommended by OT       Precautions / Restrictions Precautions Precautions: None Restrictions Weight Bearing Restrictions: No      Mobility Bed Mobility               General bed mobility comments: not assessed, pt in recliner pre/post session    Transfers Overall transfer level: Modified independent Equipment used: None                      Balance Overall balance assessment: Mild deficits observed, not formally tested   Sitting balance-Leahy Scale: Good     Standing balance support: No upper extremity supported, During functional activity Standing balance-Leahy Scale: Good                             ADL either performed or assessed with clinical judgement   ADL Overall ADL's : Modified independent                                       General ADL Comments: MOD-I with toilet transfer, standing hand hygiene, and functional mobility of short household distances     Vision Ability to See in Adequate Light: 0 Adequate Patient Visual Report: Peripheral vision impairment (Endorses intermittently seeing white flashes in peripheral visual field)              Pertinent  Vitals/Pain Pain Assessment Pain Assessment: Faces Faces Pain Scale: Hurts little more Pain Location: chronic L shoulder pain & low back pain Pain Descriptors / Indicators: Discomfort Pain Intervention(s): Premedicated before session, Monitored during session     Hand Dominance Right   Extremity/Trunk Assessment Upper Extremity Assessment Upper Extremity Assessment: Overall WFL for tasks assessed   Lower Extremity Assessment Lower Extremity  Assessment: Overall WFL for tasks assessed   Cervical / Trunk Assessment Cervical / Trunk Assessment: Normal   Communication Communication Communication: No difficulties   Cognition Arousal/Alertness: Awake/alert Behavior During Therapy: WFL for tasks assessed/performed Overall Cognitive Status: Within Functional Limits for tasks assessed                                                  Home Living Family/patient expects to be discharged to:: Private residence Living Arrangements: Alone Available Help at Discharge: Family;Friend(s);Available PRN/intermittently;Neighbor Type of Home: House Home Access: Stairs to enter [employer] of Steps: 6 Entrance Stairs-Rails: Right;Left Home Layout: One level     Bathroom Shower/Tub: Tub/shower unit         Home Equipment: Cane - single point;Grab bars - tub/shower;Shower seat - built in      Lives With: Alone    Prior Functioning/Environment Prior Level of Function : Independent/Modified Independent;Driving             Mobility Comments: No falls in past 6 months ADLs Comments: Independent with ADLs/IADLs. 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"        OT Problem List: Impaired vision/perception      OT Treatment/Interventions:      OT Goals(Current goals can be found in the care plan section) Acute Rehab OT Goals Patient Stated Goal: to return to driving OT Goal Formulation: All assessment and education complete, DC therapy  OT Frequency:         AM-PAC OT "6 Clicks" Daily Activity     Outcome Measure Help from another person eating meals?: None Help from another person taking care of personal grooming?: None Help from another person toileting, which includes using toliet, bedpan, or urinal?: None Help from another person bathing (including washing, rinsing, drying)?: None Help from another person to put on and taking off regular upper body clothing?: None Help from another  person to put on and taking off regular lower body clothing?: None 6 Click Score: 24   End of Session Nurse Communication: Mobility status  Activity Tolerance: Patient tolerated treatment well Patient left: in chair;with call bell/phone within reach  OT Visit Diagnosis: Low vision, both eyes (H54.2)                Time: 0355-9741 OT Time Calculation (min): 21 min Charges:  OT General Charges $OT Visit: 1 Visit OT Evaluation $OT Eval Moderate Complexity: 1 Mod OT Treatments $Self Care/Home Management : 8-22 mins  Fredirick Maudlin, OTR/L [place]

## 2021-02-21 NOTE — Hospital Course (Signed)
56 year old female with a known history of asthma, COPD, depression, fibromyalgia, IBS, migraine, sleep apnea and RLS admitted for stroke  1/22: Getting stroke work-up.  Pending echo, may need TEE.  Neurology following

## 2021-02-21 NOTE — Assessment & Plan Note (Signed)
Continue Requip. 

## 2021-02-21 NOTE — Evaluation (Signed)
Physical Therapy Evaluation Patient Details Name: Briana [name] MRN: [id number] DOB: 07/03/1965 Today's Date: 02/21/2021  History of Present Illness  Pt is a 56 y/o F admitted on 02/20/21 after presenting to the ED with c/o RUE weakness & numbness. MRI revealed Subcentimeter acute infarct right anterior cerebellar vermis is new since the MRI 2 days ago. PMH: asthma, COPD, depression, fibromyalgia, IBS, migraine, sleep apnea, RLS, anxiety, breast CA, COPD, lumbar DDD  Clinical Impression  Pt seen for PT evaluation with pt agreeable to tx. Pt reports prior to admission she was independent without AD, driving, & living alone. Pt reports her symptoms have mostly resolved except for occasionally seeing white flashes in her peripheral vision. Pt is able to ambulate a lap around nurses station & pod unit without AD with mod I & negotiate stairs with rail with CGA fade to supervision. Pt demonstrates good balance overall & no overt LOB. Anticipate pt is close to her baseline level of function & does not need acute PT services. PT to complete current orders, please re-consult if new needs arise.     Pt does note she has a dog she takes care of - encouraged pt to have someone assist her with pet for a day or 2 upon return home.   Recommendations for follow up therapy are one component of a multi-disciplinary discharge planning process, led by the attending physician.  Recommendations may be updated based on patient status, additional functional criteria and insurance authorization.  Follow Up Recommendations No PT follow up    Assistance Recommended at Discharge PRN  Patient can return home with the following       Equipment Recommendations None recommended by PT  Recommendations for Other Services       Functional Status Assessment Patient has not had a recent decline in their functional status     Precautions / Restrictions Precautions Precautions: None Restrictions Weight Bearing Restrictions:  No      Mobility  Bed Mobility Overal bed mobility: Modified Independent             General bed mobility comments: supine>sit with HOB slightly elevated, bed rails    Transfers Overall transfer level: Modified independent Equipment used: None               General transfer comment: sit<>stand without AD    Ambulation/Gait Ambulation/Gait assistance: Modified independent (Device/Increase time) Gait Distance (Feet): 250 Feet Assistive device: None Gait Pattern/deviations: Decreased step length - left, Decreased stride length, Decreased step length - right Gait velocity: decreased (pt reports "I don't walk fast anyway.")     General Gait Details: Initially ambulates with BUE flexed up & guarded but does relax with cuing & ambulates with BUE arm swing.  Stairs Stairs: Yes Stairs assistance: Supervision, Min guard (CGA fade to supervision) Stair Management:  (L ascending, L descending) Number of Stairs: 6 General stair comments: step to pattern with pt electing to lead with RLE  Wheelchair Mobility    Modified Rankin (Stroke Patients Only)       Balance Overall balance assessment: Mild deficits observed, not formally tested   Sitting balance-Leahy Scale: Good     Standing balance support: No upper extremity supported, During functional activity Standing balance-Leahy Scale: Good                               Pertinent Vitals/Pain Pain Assessment Pain Assessment: 0-10 Pain Score: 6  Pain Location:  chronic L shoulder pain & low back pain (denies chest pain) Pain Descriptors / Indicators: Discomfort Pain Intervention(s): RN gave pain meds during session    Home Living Family/patient expects to be discharged to:: Private residence Living Arrangements: Alone Available Help at Discharge: Family;Friend(s);Available PRN/intermittently;Neighbor Type of Home: Mobile home Home Access: Stairs to enter Entrance Stairs-Rails: Right;Left  (wideset) Entrance Stairs-Number of Steps: 6   Home Layout: One level Home Equipment: Cane - single point      Prior Function Prior Level of Function : Independent/Modified Independent;Driving             Mobility Comments: No falls in past 6 months, 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"       Hand Dominance   Dominant Hand: Right    Extremity/Trunk Assessment   Upper Extremity Assessment Upper Extremity Assessment: Overall WFL for tasks assessed    Lower Extremity Assessment Lower Extremity Assessment: Overall WFL for tasks assessed (BLE heel to shin intact, BLE sensation (to light touch) & proprioception intact)    Cervical / Trunk Assessment Cervical / Trunk Assessment: Normal  Communication   Communication: No difficulties  Cognition Arousal/Alertness: Awake/alert Behavior During Therapy: WFL for tasks assessed/performed Overall Cognitive Status: Within Functional Limits for tasks assessed                                          General Comments      Exercises     Assessment/Plan    PT Assessment Patient does not need any further PT services  PT Problem List         PT Treatment Interventions      PT Goals (Current goals can be found in the Care Plan section)  Acute Rehab PT Goals Patient Stated Goal: go home PT Goal Formulation: With patient Time For Goal Achievement: 03/07/21 Potential to Achieve Goals: Good    Frequency       Co-evaluation               AM-PAC PT "6 Clicks" Mobility  Outcome Measure Help needed turning from your back to your side while in a flat bed without using bedrails?: None Help needed moving from lying on your back to sitting on the side of a flat bed without using bedrails?: None Help needed moving to and from a bed to a chair (including a wheelchair)?: None Help needed standing up from a chair using your arms (e.g., wheelchair or bedside chair)?: None Help needed to walk in  hospital room?: None Help needed climbing 3-5 steps with a railing? : A Little 6 Click Score: 23    End of Session   Activity Tolerance: Patient tolerated treatment well Patient left: in chair (in handoff to OT) Nurse Communication:  (need to check IV 2/2 some dripping noted from IV site)      Time: 3267-1245 PT Time Calculation (min) (ACUTE ONLY): 21 min   Charges:   PT Evaluation $PT Eval Low Complexity: 1 Low PT Treatments $Therapeutic Activity: 8-22 mins        Lavone Nian, PT, DPT 02/21/21, 9:27 AM   Waunita Schooner 02/21/2021, 9:25 AM

## 2021-02-21 NOTE — Assessment & Plan Note (Addendum)
Aspirin and Plavix for 3 weeks followed by monotherapy with either agent, high intensity statin per neurology.  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  Lower extremity Dopplers negative for DVT  PT, OT eval

## 2021-02-21 NOTE — Evaluation (Signed)
Speech Language Pathology Evaluation Patient Details Name: Briana [name] MRN: [id number] DOB: 1965/02/14 Today's Date: 02/21/2021 Time: 2706-2376 SLP Time Calculation (min) (ACUTE ONLY): 30 min  Problem List:  Patient Active Problem List   Diagnosis Date Noted   CVA (cerebral vascular accident) ([place]) 02/20/2021   Genetic testing 03/09/2017   Rotator cuff tear 02/07/2017   Shoulder pain 02/07/2017   Malignant neoplasm of upper-outer quadrant of left breast in female, estrogen receptor positive ([place]) 02/07/2017   History of hysterectomy 03/16/2016   Rotator cuff (capsule) sprain 10/15/2015   S/P rotator cuff repair 10/14/2015   Atypical squamous cells of undetermined significance on cytologic smear of cervix (ASC-US) 05/13/2015   DDD (degenerative disc disease), cervical 07/21/2014   Status post cervical spinal fusion 07/21/2014   DDD (degenerative disc disease), thoracic 07/21/2014   DDD (degenerative disc disease), lumbar 07/21/2014   Facet syndrome, lumbar 07/21/2014   Sacroiliac joint dysfunction 07/21/2014   Occipital neuralgia 07/21/2014   DJD of shoulder 07/21/2014   Intractable migraine with aura without status migrainosus 02/27/2014   Bipolar disorder ([place]) 02/17/2014   CAFL (chronic airflow limitation) ([place]) 02/17/2014   Fibrositis 02/17/2014   Cephalalgia 02/17/2014   HLD (hyperlipidemia) 02/17/2014   Impaired renal function 02/17/2014   Restless leg 02/17/2014   Apnea, sleep 02/17/2014   Chronic pain associated with significant psychosocial dysfunction 11/28/2013   Past Medical History:  Past Medical History:  Diagnosis Date   Abnormal Pap smear of cervix    01/2015 ascus/neg- 04/2015 ascus/neg   Allergy    Anxiety    Arthritis    Asthma    Breast cancer ([place]) 2019   Chronic kidney disease    COPD (chronic obstructive pulmonary disease) ([facility])    DDD (degenerative disc disease), lumbar    Depression    Elevated lipids    Fibromyalgia    Fibromyalgia     Genetic testing 03/09/2017   Multi-Cancer panel (83 genes) @ [facility] - No pathogenic mutations detected   GERD (gastroesophageal reflux disease)    History of IBS    Hyperthyroidism    Joint disease    Migraine    Migraine    Oxygen deficiency    Personal history of chemotherapy    Personal history of radiation therapy    Restless leg syndrome    Sleep apnea    Does not use C-PAP, cannot tolerate mask   Past Surgical History:  Past Surgical History:  Procedure Laterality Date   ABDOMINAL HYSTERECTOMY  2008   BREAST BIOPSY Left 02/02/2017   [place] core positive   BREAST LUMPECTOMY Left 02/17/2017   BREAST LUMPECTOMY WITH NEEDLE LOCALIZATION Left 02/17/2017   Procedure: BREAST LUMPECTOMY WITH NEEDLE LOCALIZATION;  Surgeon: Vickie Epley, MD;  Location: [facility] ORS;  Service: General;  Laterality: Left;   CARPAL TUNNEL RELEASE Bilateral    cryotherapy     DILATION AND CURETTAGE OF UTERUS     ENDOMETRIAL ABLATION     LAPAROSCOPIC OOPHERECTOMY Left    unsure which side but thinks its the left   LYSIS OF ADHESION Left 10/14/2015   Procedure: LYSIS OF ADHESION;  Surgeon: Thornton Park, MD;  Location: [facility] ORS;  Service: Orthopedics;  Laterality: Left;   NECK SURGERY     lower neck fusion rods and screws   OOPHORECTOMY     one ovary removed    PORTA CATH INSERTION N/A 03/08/2017   Procedure: PORTA CATH INSERTION;  Surgeon: Katha Cabal, MD;  Location: [facility] INVASIVE CV  LAB;  Service: Cardiovascular;  Laterality: N/A;   RESECTION DISTAL CLAVICAL Left 10/14/2015   Procedure: RESECTION DISTAL CLAVICAL;  Surgeon: Thornton Park, MD;  Location: [facility] ORS;  Service: Orthopedics;  Laterality: Left;   SENTINEL NODE BIOPSY Left 02/17/2017   Procedure: SENTINEL NODE BIOPSY;  Surgeon: Vickie Epley, MD;  Location: [facility] ORS;  Service: General;  Laterality: Left;   SHOULDER ARTHROSCOPY WITH OPEN ROTATOR CUFF REPAIR Left 10/14/2015   Procedure: SHOULDER ARTHROSCOPY WITH OPEN ROTATOR CUFF  REPAIR;  Surgeon: Thornton Park, MD;  Location: [facility] ORS;  Service: Orthopedics;  Laterality: Left;   SHOULDER ARTHROSCOPY WITH OPEN ROTATOR CUFF REPAIR AND DISTAL CLAVICLE ACROMINECTOMY Right 09/03/2014   Procedure: RIGHT SHOULDER ARTHROSCOPY WITH MINI OPEN ROTATOR CUFF TEAR;  Surgeon: Thornton Park, MD;  Location: [facility] ORS;  Service: Orthopedics;  Laterality: Right;  biceps tenodesis, arthroscopic subacromial decompression and distal clavicle incision   spinal injections     SUBACROMIAL DECOMPRESSION Left 10/14/2015   Procedure: SUBACROMIAL DECOMPRESSION;  Surgeon: Thornton Park, MD;  Location: [facility] ORS;  Service: Orthopedics;  Laterality: Left;   HPI:  Per Physician's H&P "Briana [name] is a 56 y.o. Caucasian female with medical history significant for asthma, COPD, depression, fibromyalgia, IBS, migraine, sleep apnea and RLS, who presented to the ER with acute onset of right upper extremity weakness and numbness with less weakness on the right leg that started last night.  She was seen at the [facility] clinic a couple days ago and had MRI then revealed few scattered punctate foci of diffusion abnormality involving the left cerebral hemisphere and left cerebellum consistent with tiny acute to early subacute ischemic infarcts with no associated hemorrhage or mass-effect.  There are likely embolic in nature.  It showed chronic hemorrhagic lacunar infarct at the left basal ganglia.  MR a of the head was normal.  It showed no large vessel occlusion or hemodynamically significant stenosis and fetal Origin of the right PCA.  She denies any headache or dizziness or blurred vision.  No chest pain or palpitations.  She has diminished right lower visual field.  No witnessed seizures.  No urinary or stool incontinence.  No tinnitus or vertigo.  No cough or wheezing or hemoptysis.  No bleeding diathesis.    ED Course: When she came to the ER vital signs were within normal.  Labs revealed unremarkable CMP and CBC.   High-sensitivity troponin I was 74.  Influenza antigens and COVID-19 PCR came back negative.  Alcohol level was less than 10.  EKG as reviewed by me : EKG showed normal sinus rhythm rate of 78 with chronic progression.  Imaging: Noncontrasted CT scan showed chronic infarct in left anterior basal ganglia and multiple small areas of acute infarct noted on recent MRI with no new infarct or hemorrhage.  Repeat brain MRI with MRA showed the following:  1. Subcentimeter acute infarct right anterior cerebellar vermis is  new since the MRI 2 days ago. No change in multiple small embolic  infarcts in the left cerebral hemisphere and left cerebellum.  2. Chronic infarct left anterior basal ganglia unchanged  3. Negative MRA neck without contrast.    The patient was seen by Dr. Quinn Axe who recommended aspirin and refused.  She was advised by Dr. Holley Raring in the past not to take aspirin, likely NSAIDs.  When I discussed it with her and explained that her renal functions are completely normal now and benefits significantly outweigh any risk at this time, she agreed to take aspirin and  Plavix.  She will be admitted to a medical telemetry bed for further evaluation and management."   Assessment / Plan / Recommendation Clinical Impression  Pt seen for cognitive-linguistic evaluation. Evaluation completed via informal means and portions of Cognistat. Pt demonstrated intact primary language skills as well as functional cognitive-linguistic ability (e.g. attention, immediate/short term/working memory, verbal/non-verbal problem solving, abstract reasoning, insight). Pt's speech is fluent, appropriate, and without s/sx dysarthria or anomia. Per pt, pt is at cognitive-linguistic baseline.   SLP to sign off as pt has no acute SLP needs at this time.   Pt made aware of role of SLP, results of SLP assessment, and SLP POC. Pt verbalized understanding/agreement.      SLP Assessment  SLP Recommendation/Assessment: Patient does not need  any further Speech [place] [facility] SLP Visit Diagnosis: Cognitive communication deficit (R41.841)    Recommendations for follow up therapy are one component of a multi-disciplinary discharge planning process, led by the attending physician.  Recommendations may be updated based on patient status, additional functional criteria and insurance authorization.    Follow Up Recommendations  No SLP follow up    Assistance Recommended at Discharge  None  Functional Status Assessment Patient has not had a recent decline in their functional status        SLP Evaluation Cognition  Overall Cognitive Status: Within Functional Limits for tasks assessed Arousal/Alertness: Awake/alert Orientation Level: Oriented X4 Attention:  [facility]) Memory: Appears intact (4 word delayed recall task - Memory subtest of Cognistat) Problem Solving: Appears intact (verbal/non-verbal) Safety/Judgment: Appears intact       Comprehension  Auditory Comprehension Overall Auditory Comprehension: Appears within functional limits for tasks assessed Yes/No Questions: Within Functional Limits Commands: Within Functional Limits Conversation: Complex [facility]) Visual Recognition/Discrimination Discrimination: Not tested Reading Comprehension Reading Status: Not tested    Expression Expression Primary Mode of Expression: Verbal Verbal Expression Overall Verbal Expression: Appears within functional limits for tasks assessed Initiation: No impairment Automatic Speech:  (WFL) Repetition: No impairment Naming: No impairment Pragmatics: No impairment Written Expression Dominant Hand: Right Written Expression: Not tested   Oral / Motor  Oral Motor/Sensory Function Overall Oral Motor/Sensory Function: Within functional limits Motor Speech Overall Motor Speech: Appears within functional limits for tasks assessed Respiration: Within functional limits Phonation: Normal Resonance: Within functional  limits Articulation: Within functional limitis Intelligibility: Intelligible Motor Planning: Witnin functional limits   Cherrie Gauze, M.S., [facility] [phone] ([place])          Quintella Baton 02/21/2021, 10:41 AM

## 2021-02-21 NOTE — Progress Notes (Addendum)
Chaplain responded to MD request for AD, pt states that her current HCPOA is her sister, who has bipolar and schizophrenia.  Pt is anxious to remove sister from AD.  Chaplain provided materials and education around process. Advised pt to contact our department first thing in the morning to initiate process (business hours only).    Chaplain also provided emotional and spiritual care. Pt described series of family losses and health crises. Of note, pt lost her son, aged 57 in 2008, grief is ongoing.  Chaplain shared tools, offered prayer which pt welcomed.  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  Favorite vs, Isaiah 40:31.    Our department will await pt's call to complete process.  [place], [place] Pager:  [phone]    02/21/21 1700  Clinical Encounter Type  Visited With Patient  Visit Type Initial;Other (Comment) (AD)  Consult/Referral To Physician  Spiritual Encounters  Spiritual Needs Prayer  Stress Factors  Patient Stress Factors  (Requests AD)  Advance Directives (For Healthcare)  Does Patient Have a Medical Advance Directive? Yes (However, pt wants to update HCPOA)  Does patient want to make changes to medical advance directive? Yes (Inpatient - patient requests chaplain consult to change a medical advance directive)  Type of Advance Directive [place];Living will (States sister should be removed)  Copy of Living Will in Chart? No - copy requested  Living Will Requested and Now in Chart  (Current one needs to amended)  Would patient like information on creating a medical advance directive? Yes (Inpatient - patient requests chaplain consult to create a medical advance directive)

## 2021-02-21 NOTE — Assessment & Plan Note (Signed)
Continue Arimidex 

## 2021-02-22 ENCOUNTER — Encounter
Admission: EM | Disposition: A | Discharge status: Home / Self Care | Payer: Self-pay | Source: Home / Self Care | Attending: Internal Medicine

## 2021-02-22 ENCOUNTER — Inpatient Hospital Stay
Admit: 2021-02-22 | Discharge: 2021-02-22 | Disposition: A | Discharge status: Home / Self Care | Payer: Medicare Other | Attending: Internal Medicine | Admitting: Internal Medicine

## 2021-02-22 ENCOUNTER — Inpatient Hospital Stay: Admit: 2021-02-22 | Payer: Medicare Other

## 2021-02-22 DIAGNOSIS — I253 Aneurysm of heart: Secondary | ICD-10-CM

## 2021-02-22 DIAGNOSIS — Q2112 Patent foramen ovale: Secondary | ICD-10-CM

## 2021-02-22 DIAGNOSIS — M79604 Pain in right leg: Secondary | ICD-10-CM

## 2021-02-22 DIAGNOSIS — I639 Cerebral infarction, unspecified: Secondary | ICD-10-CM | POA: Diagnosis not present

## 2021-02-22 DIAGNOSIS — M79605 Pain in left leg: Secondary | ICD-10-CM

## 2021-02-22 HISTORY — DX: Patent foramen ovale: Q21.12

## 2021-02-22 HISTORY — PX: TEE WITHOUT CARDIOVERSION: SHX5443

## 2021-02-22 HISTORY — DX: Aneurysm of heart: I25.3

## 2021-02-22 LAB — HEMOGLOBIN A1C
Hgb A1c MFr Bld: 5.9 % — ABNORMAL HIGH (ref 4.8–5.6)
Hgb A1c MFr Bld: 5.6 % (ref 4.8–5.6)
Mean Plasma Glucose: 123 mg/dL
Mean Plasma Glucose: 114 mg/dL

## 2021-02-22 SURGERY — ECHOCARDIOGRAM, TRANSESOPHAGEAL
Anesthesia: Moderate Sedation

## 2021-02-22 MED ORDER — SODIUM CHLORIDE 0.9 % IV SOLN: INTRAVENOUS | Status: DC

## 2021-02-22 MED ORDER — FENTANYL CITRATE (PF) 100 MCG/2ML IJ SOLN
INTRAMUSCULAR | Status: AC
  Filled 2021-02-22: qty 2

## 2021-02-22 MED ORDER — MIDAZOLAM HCL 2 MG/2ML IJ SOLN
INTRAMUSCULAR | Status: AC | PRN
  Administered 2021-02-22: 1 mg via INTRAVENOUS
  Administered 2021-02-22: 2 mg via INTRAVENOUS

## 2021-02-22 MED ORDER — SODIUM CHLORIDE FLUSH 0.9 % IV SOLN
INTRAVENOUS | Status: AC
  Filled 2021-02-22: qty 10

## 2021-02-22 MED ORDER — ALUM & MAG HYDROXIDE-SIMETH 200-200-20 MG/5ML PO SUSP
30.0000 mL | Freq: Four times a day (QID) | ORAL | Status: DC | PRN
  Administered 2021-02-22: 10:00:00 30 mL via ORAL
  Filled 2021-02-22: qty 30

## 2021-02-22 MED ORDER — BUTAMBEN-TETRACAINE-BENZOCAINE 2-2-14 % EX AERO
INHALATION_SPRAY | CUTANEOUS | Status: AC
  Filled 2021-02-22: qty 5

## 2021-02-22 MED ORDER — ASPIRIN 81 MG PO TBEC: 81.0000 mg | DELAYED_RELEASE_TABLET | Freq: Every day | ORAL | 11 refills | Status: DC

## 2021-02-22 MED ORDER — MIDAZOLAM HCL 2 MG/2ML IJ SOLN
INTRAMUSCULAR | Status: AC
  Filled 2021-02-22: qty 2

## 2021-02-22 MED ORDER — LIDOCAINE VISCOUS HCL 2 % MT SOLN
OROMUCOSAL | Status: AC
  Administered 2021-02-22: 15 mL
  Filled 2021-02-22: qty 15

## 2021-02-22 MED ORDER — FENTANYL CITRATE (PF) 100 MCG/2ML IJ SOLN
INTRAMUSCULAR | Status: AC | PRN
  Administered 2021-02-22: 25 ug via INTRAVENOUS
  Administered 2021-02-22: 50 ug via INTRAVENOUS

## 2021-02-22 MED ORDER — CLOPIDOGREL BISULFATE 75 MG PO TABS: 75.0000 mg | ORAL_TABLET | Freq: Every day | ORAL | 0 refills | Status: AC

## 2021-02-22 NOTE — Progress Notes (Signed)
Reviewed d/c instructions with pt, no distress noted, pain med given for pain, family at bedside. PIV d/c.

## 2021-02-22 NOTE — CV Procedure (Signed)
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 The patient and or family understand the risks of the procedure including the rare risks of death, stroke, heart attack, esophogeal perforation, sore throat, and reaction to medications given.  Moderate sedation During this procedure the patient has received Versed 3 milligrams and fentanyl 75 micrograms to achieve appropriate moderate sedation.  The patient had continued monitoring of heart rate, oxygenation, blood pressure, respiratory rate, and extent of signs of sedation throughout the entire procedure.  The patient received this moderate sedation over a period of 12 minutes.  Both the nursing staff and I were present during the procedure when the patient had moderate sedation for 100% of the time.  Treatment considerations  Antiplatelet therapy for patent foramen ovale and minimal atherosclerosis of aorta but no current evidence of primary cause of embolism  For further details of transesophageal echocardiogram please refer to final report.  Signed,  [name] M.D. [facility] 02/22/2021 8:53 AM

## 2021-02-22 NOTE — Progress Notes (Signed)
PT had filled out AD and sought notary / volunteers. Took a while to get everybody together, but we achieved success.

## 2021-02-22 NOTE — Progress Notes (Signed)
*  PRELIMINARY RESULTS* Echocardiogram Echocardiogram Transesophageal has been performed.  Sherrie Sport 02/22/2021, 9:06 AM

## 2021-02-22 NOTE — Progress Notes (Signed)
Imaging studies review:  MRI brain, 1/21:  1. Subcentimeter acute infarct right anterior cerebellar vermis is new since the MRI 2 days ago. No change in multiple small embolic infarcts in the left cerebral hemisphere and left cerebellum. 2. Chronic infarct left anterior basal ganglia unchanged 3. Negative MRA neck without contrast.  MRA head, 1/19: 1. Normal intracranial MRA. No large vessel occlusion or hemodynamically significant stenosis. 2. Fetal type origin of the right PCA.  MRA neck, 1/19:  Negative MRA neck without contrast.  TEE:  1. Left ventricular ejection fraction, by estimation, is 60 to 65%. The  left ventricle has normal function.   2. Right ventricular systolic function is normal. The right ventricular  size is normal.   3. No left atrial/left atrial appendage thrombus was detected.   4. The mitral valve is normal in structure. Trivial mitral valve  regurgitation.   5. The aortic valve is normal in structure. Aortic valve regurgitation is  trivial.   6. Cannot exclude a small PFO. Agitated saline contrast bubble study was  positive with shunting observed within 3-6 cardiac cycles suggestive of  interatrial shunt.   BLE ultrasound: No DVT seen  Assessment/Recommendations: 56 year old woman with a history of breast cancer 2019, CKD, COPD, hyperlipidemia, migraine, prior hemorrhagic left basal ganglia infarct and recent acute to subacute infarcts 4 days PTA on MRI who presented with new onset right-sided weakness and numbness since 9 AM on day of admission c/f recurrent ischemic infarcts.  - Her infarcts on MRI brain 1/19 are very small but are in both the anterior and posterior circulations and are c/f central embolic source.  - Patient was adamant she does not want to take ASA bc her kidney doctor told her not to in the setting of CKD even though Neurology has discussed with her that in the setting of recurrent acute strokes the benefit is felt to outweigh the  risk. She has been started on plavix. Ideally she would be on DAPT for at least the next 3 weeks. She c/o bilateral leg pain and so I ordered BLE dopplers r/o DVT.  - No definite indication for anticoagulation given normal EKG and TEE being negative except for possible PFO - Will need loop recorder placement to further assess for possible central embolic source of her current and prior strokes.  - Will need close outpatient Neurology follow up.   Electronically signed: Dr. Kerney Elbe

## 2021-02-22 NOTE — Progress Notes (Addendum)
Neurology progress note  S: Patient up and walking in the halls today. Feels R sided weakness slightly improved. MRI brain overnight showed R cerebellar infarct new since MRI brain a few days before showing L cerebral hemisphere and L cerebellar acute infarcts. Scheduled for TEE tomorrow.  Stroke workup this admission  MRI brain 1. Subcentimeter acute infarct right anterior cerebellar vermis is new since the MRI 2 days ago. No change in multiple small embolic infarcts in the left cerebral hemisphere and left cerebellum. 2. Chronic hemorrhagic infarct left anterior basal ganglia unchanged  MRA H&N wo contrast - WNL  O:  Vitals:   02/21/21 2112 02/22/21 0550  BP: (!) 140/91 135/75  Pulse:  85  Resp:  16  Temp:  98.1 F (36.7 C)  SpO2:  100%   Physical Exam Gen: A&O x4, NAD HEENT: Atraumatic, normocephalic;mucous membranes moist; oropharynx clear, tongue without atrophy or fasciculations. Neck: Supple, trachea midline. Resp: CTAB, no w/r/r CV: RRR, no m/g/r; nml S1 and S2. 2+ symmetric peripheral pulses. Abd: soft/NT/ND; nabs x 4 quad Extrem: Nml bulk; no cyanosis, clubbing, or edema.   Neuro: *MS: A&O x4. Follows multi-step commands.  *Speech: fluid, nondysarthric, able to name and repeat *CN:    I: Deferred   II,III: PERRLA, VFF by confrontation, optic discs unable to be visualized 2/2 pupillary constriction   III,IV,VI: EOMI w/o nystagmus, no ptosis   V: Sensation intact from V1 to V3 to LT   VII: Eyelid closure was full.  R UMN facial droop   VIII: Hearing intact to voice   IX,X: Voice normal, palate elevates symmetrically    XI: SCM/trap 5/5 bilat   XII: Tongue protrudes midline, no atrophy or fasciculations    *Motor:   Normal bulk.  No tremor, rigidity or bradykinesia. All extremities full strength and symmetric throughout. *Sensory: Impaired to LT in RLE. No double-simultaneous extinction.  *Coordination:  Finger-to-nose, heel-to-shin, rapid alternating motions  were intact. *Reflexes:  2+ and symmetric throughout without clonus; toes down-going bilat *Gait: deferred   NIHSS = 2 for R facial droop and RLE sensory deficit.    Premorbid mRS = 1  A/P: This is a 56 year old woman with a history of breast cancer 2019, CKD, COPD, hyperlipidemia, migraine, prior hemorrhagic left basal ganglia infarct with multiple acute infarcts within past week in multiple cerebrovascular distributions (bilateral, anterior and posterior) c/w central embolic source. Cardiology consulted who felt that regardless of TTE results she would need to undergo TEE. BLE dopplers neg.  - Permissive HTN x48 hrs from sx onset (0900 02/20/21) MRI goal BP <220/110. PRN labetalol or hydralazine if BP above these parameters. Avoid oral antihypertensives. - TEE scheduled for Mon AM - Check A1c and LDL + add statin per guidelines - ASA 81mg  daily + plavix 75mg  daily x21 days f/b ASA 81mg  daily monotherapy after that - q4 hr neuro checks - STAT head CT for any change in neuro exam - Tele - PT/OT/SLP - Stroke education - F/u with established outpatient neurologist after discharge  Will continue to follow  Su Monks, MD [facility] [phone]  If 7pm- 7am, please page neurology on call as listed in [place].

## 2021-02-22 NOTE — Progress Notes (Signed)
[facility] Cardiology [facility] Encounter Note  Patient: Briana [name] / Admit Date: 02/20/2021 / Date of Encounter: 02/22/2021, 8:50 AM   Subjective: Patient overall doing well today.  No evidence of significant strokelike symptoms and/or residual issues.  Patient has reasonable heart rate control with no evidence of atrial fibrillation.  Transesophageal echocardiogram showing normal LV systolic function with ejection fraction of 60% with no evidence of valvular heart disease vegetation or primary source of embolus.  Atrial appendage without thrombus.  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  Review of Systems: Positive for: None Negative for: Vision change, hearing change, syncope, dizziness, nausea, vomiting,diarrhea, bloody stool, stomach pain, cough, congestion, diaphoresis, urinary frequency, urinary pain,skin lesions, skin rashes Others previously listed  Objective: Telemetry: Normal sinus rhythm Physical Exam: Blood pressure 106/66, pulse 88, temperature 98.4 F (36.9 C), temperature source Oral, resp. rate (!) 22, height 5\' 3"  (1.6 m), weight 77.8 kg, last menstrual period 08/24/2008, SpO2 97 %. Body mass index is 30.38 kg/m. General: Well developed, well nourished, in no acute distress. Head: Normocephalic, atraumatic, sclera non-icteric, no xanthomas, nares are without discharge. Neck: No apparent masses Lungs: Normal respirations with no wheezes, no rhonchi, no rales , no crackles   Heart: Regular rate and rhythm, normal S1 S2, no murmur, no rub, no gallop, PMI is normal size and placement, carotid upstroke normal without bruit, jugular venous pressure normal Abdomen: Soft, non-tender, non-distended with normoactive bowel sounds. No hepatosplenomegaly. Abdominal aorta is normal size without bruit Extremities: No edema, no clubbing, no cyanosis, no ulcers,  Peripheral: 2+ radial, 2+ femoral, 2+ dorsal pedal pulses Neuro: Alert and  oriented. Moves all extremities spontaneously. Psych:  Responds to questions appropriately with a normal affect.   Intake/Output Summary (Last 24 hours) at 02/22/2021 0850 Last data filed at 02/22/2021 0655 Gross per 24 hour  Intake 1826.46 ml  Output --  Net 1826.46 ml    Inpatient Medications:   albuterol  2 puff Inhalation Q6H   anastrozole  1 mg Oral Daily   aspirin EC  81 mg Oral Daily   calcium carbonate  1,250 mg Oral Q breakfast   cholecalciferol  1,000 Units Oral BID   clopidogrel  75 mg Oral Daily   enoxaparin (LOVENOX) injection  40 mg Subcutaneous Q24H   fentaNYL       midazolam       midazolam       pregabalin  25 mg Oral BID   rOPINIRole  1.5 mg Oral QHS   rosuvastatin  40 mg Oral QHS   sodium chloride flush       topiramate  200 mg Oral Daily   traZODone  50 mg Oral QHS   vitamin E  200 Units Oral Once per day on Sun Tue Thu Sat   Infusions:   sodium chloride 20 mL/hr at 02/22/21 0647   sodium chloride 20 mL/hr at 02/22/21 0655    Labs: Recent Labs    02/20/21 1337  NA 137  K 3.8  CL 106  CO2 23  GLUCOSE 98  BUN 14  CREATININE 1.05*  CALCIUM 9.2   Recent Labs    02/20/21 1337  AST 14*  ALT 11  ALKPHOS 84  BILITOT 0.3  PROT 7.4  ALBUMIN 3.8   Recent Labs    02/20/21 1337  WBC 9.6  NEUTROABS 6.7  HGB 13.7  HCT 42.3  MCV 99.5  PLT 196   No results for input(s):  CKTOTAL, CKMB, TROPONINI in the last 72 hours. Invalid input(s): POCBNP No results for input(s): HGBA1C in the last 72 hours.   Weights: Filed Weights   02/20/21 1305 02/20/21 2221  Weight: 79.4 kg 77.8 kg     Radiology/Studies:  CT HEAD WO CONTRAST (5MM)  Result Date: 02/20/2021 CLINICAL DATA:  Acute neuro deficit.  Right-sided numbness EXAM: CT HEAD WITHOUT CONTRAST TECHNIQUE: Contiguous axial images were obtained from the base of the skull through the vertex without intravenous contrast. RADIATION DOSE REDUCTION: This exam was performed according to the  departmental dose-optimization program which includes automated exposure control, adjustment of the mA and/or kV according to patient size and/or use of iterative reconstruction technique. COMPARISON:  MRI head 02/18/2021 FINDINGS: Brain: Several small areas of restricted diffusion in the left cerebellum and left cerebral hemisphere on MRI are not identified on CT. No acute cortical infarct or hemorrhage by CT. No mass Chronic infarct left lenticular nuclei unchanged. Ventricle size and cerebral volume normal. Vascular: Negative for hyperdense vessel Skull: Negative Sinuses/Orbits: Paranasal sinuses clear.  Negative orbit Other: None IMPRESSION: Negative for acute infarct or hemorrhage Chronic infarct left anterior basal ganglia. Multiple small areas of acute infarct noted on recent MRI. These results were called by telephone at the time of interpretation on 02/20/2021 at 1:20 pm to provider [facility] , who verbally acknowledged these results. Electronically Signed   By: Franchot Gallo M.D.   On: 02/20/2021 13:21   MR ANGIO HEAD WO CONTRAST  Result Date: 02/18/2021 CLINICAL DATA:  Initial evaluation for episodic right-sided visual loss. EXAM: MRI HEAD WITHOUT CONTRAST MRA HEAD WITHOUT CONTRAST TECHNIQUE: Multiplanar, multi-echo pulse sequences of the brain and surrounding structures were acquired without intravenous contrast. Angiographic images of the Circle of Willis were acquired using MRA technique without intravenous contrast. COMPARISON:  Prior MRI from 01/29/2019. FINDINGS: MRI HEAD FINDINGS Brain: Cerebral volume within normal limits. Chronic hemorrhagic lacunar infarct present at the left basal ganglia. No other significant cerebral white matter disease for age. There are a few scattered subtle punctate foci of diffusion abnormality involving the left cerebral hemisphere (series 5, images 40, 35, and 32). Additional small focus noted involving the left cerebellum (series 5, image 15). Findings  consistent with tiny acute to early subacute ischemic infarcts. These are likely embolic in nature. No associated hemorrhage or mass effect. Gray-white matter differentiation otherwise maintained. No other areas of chronic cortical infarction. No other acute or chronic blood products seen within the brain. No mass lesion, midline shift or mass effect. No hydrocephalus or extra-axial fluid collection. Pituitary gland suprasellar region normal. Midline structures intact. Vascular: Major intracranial vascular flow voids are maintained. Skull and upper cervical spine: Craniocervical junction within normal limits. Bone marrow signal intensity normal. No scalp soft tissue abnormality. Sinuses/Orbits: Globes and orbital soft tissues demonstrate no acute finding. Paranasal sinuses are clear. No significant mastoid effusion. Inner ear structures grossly normal. Other: None. MRA HEAD FINDINGS Anterior circulation: Visualized distal cervical segments of the internal carotid arteries are widely patent with antegrade flow. Petrous, cavernous, and supraclinoid segments patent without stenosis or other abnormality. Left A1 segment widely patent. Right A1 hypoplastic and/or absent, accounting for the slightly diminutive right ICA is compared to the left. Normal anterior communicating artery complex. Anterior cerebral arteries widely patent without stenosis. No M1 stenosis or occlusion. Normal MCA bifurcations. Distal MCA branches well perfused and symmetric. Posterior circulation: Vertebral arteries are largely code dominant and widely patent to the vertebrobasilar junction. Both PICA origins patent  and normal. Basilar patent to its distal aspect without stenosis. Superior cerebellar arteries patent bilaterally. Left PCA primarily supplied via the basilar, although a hypoplastic left posterior communicating artery noted as well. Fetal type origin of the right PCA. Both PCAs well perfused or distal aspects without stenosis. Anatomic  variants: Fetal type origin of the right PCA. No aneurysm or other vascular malformation. IMPRESSION: MRI HEAD IMPRESSION: 1. Few scattered punctate foci of diffusion abnormality involving the left cerebral hemisphere and left cerebellum, consistent with tiny acute to early subacute ischemic infarcts. No associated hemorrhage or mass effect. These are likely embolic in nature. 2. Chronic hemorrhagic lacunar infarct at the left basal ganglia. MRA HEAD IMPRESSION: 1. Normal intracranial MRA. No large vessel occlusion or hemodynamically significant stenosis. 2. Fetal type origin of the right PCA. These results will be called to the ordering clinician or representative by the Radiologist Assistant, and communication documented in the PACS or [employer]. Electronically Signed   By: Jeannine Boga M.D.   On: 02/18/2021 19:02   MR ANGIO NECK WO CONTRAST  Result Date: 02/20/2021 CLINICAL DATA:  Acute neuro deficit. Right-sided weakness and numbness. EXAM: MRI HEAD WITHOUT CONTRAST MRA NECK WITHOUT  CONTRAST TECHNIQUE: Multiplanar, multi-echo pulse sequences of the brain and surrounding structures were acquired without intravenous contrast. Angiographic images of the neck were acquired using MRA technique without intravenous contrast. Carotid stenosis measurements (when applicable) are obtained utilizing NASCET criteria, using the distal internal carotid diameter as the denominator. COMPARISON:  MRI head 02/18/2021 FINDINGS: MRI HEAD FINDINGS Brain: Acute infarct in the right anterior cerebellar vermis is new from the prior study. No change in small areas of restricted diffusion left cerebellum and left frontal and parietal lobes compatible with acute infarct. Chronic infarct left anterior basal ganglia. Ventricle size normal. No hemorrhage or mass. Vascular: Normal arterial flow voids Skull and upper cervical spine: No focal skeletal lesion. Sinuses/Orbits: Paranasal sinuses clear.  Negative orbit Other:  None MRA NECK FINDINGS Aortic arch: Antegrade flow in the carotid and vertebral arteries bilaterally. Carotid bifurcation is widely patent bilaterally without stenosis Proximal vertebral arteries are not well seen due to artifact and motion. Mid and distal vertebral arteries are patent bilaterally. IMPRESSION: 1. Subcentimeter acute infarct right anterior cerebellar vermis is new since the MRI 2 days ago. No change in multiple small embolic infarcts in the left cerebral hemisphere and left cerebellum. 2. Chronic infarct left anterior basal ganglia unchanged 3. Negative MRA neck without contrast. Electronically Signed   By: Franchot Gallo M.D.   On: 02/20/2021 15:07   MR BRAIN WO CONTRAST  Result Date: 02/20/2021 CLINICAL DATA:  Acute neuro deficit. Right-sided weakness and numbness. EXAM: MRI HEAD WITHOUT CONTRAST MRA NECK WITHOUT  CONTRAST TECHNIQUE: Multiplanar, multi-echo pulse sequences of the brain and surrounding structures were acquired without intravenous contrast. Angiographic images of the neck were acquired using MRA technique without intravenous contrast. Carotid stenosis measurements (when applicable) are obtained utilizing NASCET criteria, using the distal internal carotid diameter as the denominator. COMPARISON:  MRI head 02/18/2021 FINDINGS: MRI HEAD FINDINGS Brain: Acute infarct in the right anterior cerebellar vermis is new from the prior study. No change in small areas of restricted diffusion left cerebellum and left frontal and parietal lobes compatible with acute infarct. Chronic infarct left anterior basal ganglia. Ventricle size normal. No hemorrhage or mass. Vascular: Normal arterial flow voids Skull and upper cervical spine: No focal skeletal lesion. Sinuses/Orbits: Paranasal sinuses clear.  Negative orbit Other: None MRA NECK FINDINGS Aortic  arch: Antegrade flow in the carotid and vertebral arteries bilaterally. Carotid bifurcation is widely patent bilaterally without stenosis Proximal  vertebral arteries are not well seen due to artifact and motion. Mid and distal vertebral arteries are patent bilaterally. IMPRESSION: 1. Subcentimeter acute infarct right anterior cerebellar vermis is new since the MRI 2 days ago. No change in multiple small embolic infarcts in the left cerebral hemisphere and left cerebellum. 2. Chronic infarct left anterior basal ganglia unchanged 3. Negative MRA neck without contrast. Electronically Signed   By: Franchot Gallo M.D.   On: 02/20/2021 15:07   MR BRAIN WO CONTRAST  Result Date: 02/18/2021 CLINICAL DATA:  Initial evaluation for episodic right-sided visual loss. EXAM: MRI HEAD WITHOUT CONTRAST MRA HEAD WITHOUT CONTRAST TECHNIQUE: Multiplanar, multi-echo pulse sequences of the brain and surrounding structures were acquired without intravenous contrast. Angiographic images of the Circle of Willis were acquired using MRA technique without intravenous contrast. COMPARISON:  Prior MRI from 01/29/2019. FINDINGS: MRI HEAD FINDINGS Brain: Cerebral volume within normal limits. Chronic hemorrhagic lacunar infarct present at the left basal ganglia. No other significant cerebral white matter disease for age. There are a few scattered subtle punctate foci of diffusion abnormality involving the left cerebral hemisphere (series 5, images 40, 35, and 32). Additional small focus noted involving the left cerebellum (series 5, image 15). Findings consistent with tiny acute to early subacute ischemic infarcts. These are likely embolic in nature. No associated hemorrhage or mass effect. Gray-white matter differentiation otherwise maintained. No other areas of chronic cortical infarction. No other acute or chronic blood products seen within the brain. No mass lesion, midline shift or mass effect. No hydrocephalus or extra-axial fluid collection. Pituitary gland suprasellar region normal. Midline structures intact. Vascular: Major intracranial vascular flow voids are maintained. Skull  and upper cervical spine: Craniocervical junction within normal limits. Bone marrow signal intensity normal. No scalp soft tissue abnormality. Sinuses/Orbits: Globes and orbital soft tissues demonstrate no acute finding. Paranasal sinuses are clear. No significant mastoid effusion. Inner ear structures grossly normal. Other: None. MRA HEAD FINDINGS Anterior circulation: Visualized distal cervical segments of the internal carotid arteries are widely patent with antegrade flow. Petrous, cavernous, and supraclinoid segments patent without stenosis or other abnormality. Left A1 segment widely patent. Right A1 hypoplastic and/or absent, accounting for the slightly diminutive right ICA is compared to the left. Normal anterior communicating artery complex. Anterior cerebral arteries widely patent without stenosis. No M1 stenosis or occlusion. Normal MCA bifurcations. Distal MCA branches well perfused and symmetric. Posterior circulation: Vertebral arteries are largely code dominant and widely patent to the vertebrobasilar junction. Both PICA origins patent and normal. Basilar patent to its distal aspect without stenosis. Superior cerebellar arteries patent bilaterally. Left PCA primarily supplied via the basilar, although a hypoplastic left posterior communicating artery noted as well. Fetal type origin of the right PCA. Both PCAs well perfused or distal aspects without stenosis. Anatomic variants: Fetal type origin of the right PCA. No aneurysm or other vascular malformation. IMPRESSION: MRI HEAD IMPRESSION: 1. Few scattered punctate foci of diffusion abnormality involving the left cerebral hemisphere and left cerebellum, consistent with tiny acute to early subacute ischemic infarcts. No associated hemorrhage or mass effect. These are likely embolic in nature. 2. Chronic hemorrhagic lacunar infarct at the left basal ganglia. MRA HEAD IMPRESSION: 1. Normal intracranial MRA. No large vessel occlusion or hemodynamically  significant stenosis. 2. Fetal type origin of the right PCA. These results will be called to the ordering clinician or representative by the  Psychologist, clinical, and communication documented in the PACS or [employer]. Electronically Signed   By: Jeannine Boga M.D.   On: 02/18/2021 19:02   US Venous Img Lower Bilateral (DVT)  Result Date: 02/20/2021 CLINICAL DATA:  Bilateral leg pain and swelling EXAM: BILATERAL LOWER EXTREMITY VENOUS DOPPLER ULTRASOUND TECHNIQUE: Gray-scale sonography with compression, as well as color and duplex ultrasound, were performed to evaluate the deep venous system(s) from the level of the common femoral vein through the popliteal and proximal calf veins. COMPARISON:  None. FINDINGS: VENOUS Normal compressibility of the common femoral, superficial femoral, and popliteal veins, as well as the visualized calf veins. Visualized portions of profunda femoral vein and great saphenous vein unremarkable. No filling defects to suggest DVT on grayscale or color Doppler imaging. Doppler waveforms show normal direction of venous flow, normal respiratory plasticity and response to augmentation. OTHER None. Limitations: none IMPRESSION: No findings of lower extremity DVT. Electronically Signed   By: Van Clines M.D.   On: 02/20/2021 15:09     Assessment and Recommendation  56 y.o. female with known tobacco abuse hypertension hyperlipidemia with acute stroke in 2 distributions concerning for possible embolic phenomenon currently doing well with current medications 1.  Continue dual antiplatelet therapy for 2 months for acute stroke and then consider single antiplatelet therapy thereafter 2.  High intensity cholesterol therapy 3.  Patient was counseled on discontinuation of tobacco abuse 4.  Hypertension control with a goal systolic blood pressure below 120 mm 5.  Further consideration of outpatient monitor for other source including atrial fibrillation 6.  If ambulating  well from the cardiovascular standpoint okay for discharged home after above  Signed, Serafina Royals M.D. FACC

## 2021-02-22 NOTE — TOC Transition Note (Signed)
Transition of Care [facility]) - CM/SW Discharge Note   Patient Details  Name: Briana [name] MRN: [id number] Date of Birth: March 28, 1965  [facility]  [facility] CM/SW Contact:  Candie Chroman, LCSW Phone Number: 02/22/2021, 3:19 PM   Clinical Narrative:   Patient has orders to discharge home today. She is agreeable to outpatient OT. Prefers [facility] because she has been there in the past. Faxed signed order form. Family member at bedside will take her home today. No further concerns. CSW signing off.  Final next level of care: OP Rehab Barriers to Discharge: No Barriers Identified   Patient Goals and CMS Choice     Choice offered to / list presented to : Patient  Discharge Placement                Patient to be transferred to facility by: Family member at bedside   Patient and family notified of of transfer: 02/22/21  Discharge Plan and Services                                     Social Determinants of Health (SDOH) Interventions     Readmission Risk Interventions No flowsheet data found.

## 2021-02-24 NOTE — Discharge Summary (Signed)
Physician Discharge Summary   Patient: Briana [name] MRN: [id number] DOB: 11-03-1965  Admit date:     02/20/2021  Discharge date: 02/22/2021  Discharge Physician: [name]   PCP: [name], MD   Recommendations at discharge:    F/up with outpt providers as requested  Discharge Diagnoses Principal Problem:   CVA (cerebral vascular accident) [facility]) Active Problems:   Bipolar disorder ([name])   Chronic pain associated with significant psychosocial dysfunction   Restless leg   Malignant neoplasm of upper-outer quadrant of left breast in female, estrogen receptor positive ([name])   Leg pain, bilateral   Hospital Course   56 year old female with a known history of asthma, COPD, depression, fibromyalgia, IBS, migraine, sleep apnea and RLS admitted for stroke  1/22: Getting stroke work-up.  Pending echo, may need TEE.  Neurology following  * CVA (cerebral vascular accident) ([name])- (present on admission) Aspirin and Plavix for 3 weeks followed by monotherapy with either agent, high intensity statin per neurology. TEE shows possible PFO but no definite indication for anticoagulation per neuro.  Loop monitor placed to evaluate for any arrhythmias and possible central embolic source of stroke. - outpt neuro f/up with [name] [facility])  Malignant neoplasm of upper-outer quadrant of left breast in female, estrogen receptor positive ([name]) Continue Arimidex  Restless leg- (present on admission) Continue Requip  Chronic pain associated with significant psychosocial dysfunction- (present on admission) Continue her home narcotic regimen  Bipolar disorder ([name])- (present on admission) Continue Topamax    Consultants: Neuro Disposition: Home Diet recommendation: Cardiac diet  DISCHARGE MEDICATION: Allergies as of 02/22/2021       Reactions   Iodine    Bee Venom Hives, Rash   Cefoxitin Rash   Cefuroxime Axetil Hives, Rash   Cucumber Extract Rash   Gadolinium Derivatives Swelling,  Other (See Comments), Rash   NECK BECAME RED AND TONGUE WAS SWELLING SLIGHTLY PER PT NECK BECAME RED AND TONGUE WAS SWELLING SLIGHTLY PER PT NECK BECAME RED AND TONGUE WAS SWELLING SLIGHTLY PER PT   Iodinated Contrast Media Hives, Rash   Latex Rash   Other Hives, Rash   Bee Stings-swelling/hives/rash Wool (textile fiber)-Rash/itching   Tomato Rash   Red tomatoes        Medication List     STOP taking these medications    hydrocortisone 2.5 % cream       TAKE these medications    anastrozole 1 MG tablet Commonly known as: ARIMIDEX Take 1 tablet (1 mg total) by mouth daily.   aspirin 81 MG EC tablet Take 1 tablet (81 mg total) by mouth daily. Swallow whole.   baclofen 10 MG tablet Commonly known as: LIORESAL Take 10 mg by mouth 3 (three) times daily as needed for muscle spasms (typically 2-3 times daily).   calcium carbonate 600 MG Tabs tablet Commonly known as: OS-CAL Take by mouth.   cholecalciferol 1000 units tablet Commonly known as: VITAMIN D Take 1,000 Units by mouth 2 (two) times daily.   clopidogrel 75 MG tablet Commonly known as: PLAVIX Take 1 tablet (75 mg total) by mouth daily. Aspirin and Plavix for 3 weeks followed by monotherapy (either aspirin or Plavix) for rest of her life   diclofenac Sodium 1 % Gel Commonly known as: VOLTAREN SMARTSIG:2-4 Gram(s) Topical 4 Times Daily   EPINEPHrine 0.15 MG/0.3ML injection Commonly known as: EPIPEN JR Inject 0.15 mg into the muscle as needed for anaphylaxis.   Hair Skin and Nails Formula Tabs Take 2  tablets by mouth daily.   lidocaine 5 % Commonly known as: LIDODERM SMARTSIG:Topical   Narcan 4 MG/0.1ML Liqd nasal spray kit Generic drug: naloxone CALL 911. ADMINISTER A SINGLE SPRAY OF NARCAN IN ONE NOSTRIL. REPEAT EVERY 3 MINUTES AS NEEDED IF NO OR MINIMAL RESPONSE.   OXYCODONE HCL PO Take 15 mg by mouth as needed. Take 4-6 times a day as needed.   pregabalin 25 MG capsule Commonly known as:  LYRICA Take 1 capsule by mouth 2 (two) times daily.   promethazine 25 MG tablet Commonly known as: PHENERGAN Take 25 mg by mouth every 6 (six) hours as needed for nausea or vomiting.   rizatriptan 10 MG disintegrating tablet Commonly known as: MAXALT-MLT Take 10 mg by mouth as needed for migraine. May repeat in 2 hours if needed   rOPINIRole 1 MG tablet Commonly known as: REQUIP Take 1.5 mg by mouth at bedtime.   rosuvastatin 40 MG tablet Commonly known as: CRESTOR Take 40 mg by mouth at bedtime.   topiramate 100 MG tablet Commonly known as: TOPAMAX Take 200 mg by mouth daily.   traZODone 50 MG tablet Commonly known as: DESYREL Take 50 mg by mouth at bedtime.   vitamin E 200 UNIT capsule Take 200 Units by mouth daily.        Follow-up Information     [name], MD Follow up in 1 week(s).   Specialty: Cardiology Contact information: [address] [facility] [place] [address] [phone]         [name], MD. Schedule an appointment as soon as possible for a visit in 1 week(s).   Specialty: Internal Medicine Why: [facility] Discharge F/UP Contact information: [place] [address] [phone]         [name], MD. Schedule an appointment as soon as possible for a visit in 2 week(s).   Specialty: Neurology Why: [facility] Discharge F/UP Contact information: [facility] [place] [address] [phone]                 Discharge Exam: [name] Weights   02/20/21 1305 02/20/21 2221  Weight: 79.4 kg 77.8 kg   GENERAL:  56 y.o.-year-old Caucasian female patient lying in the bed with no acute distress.  EYES: Pupils equal, round, reactive to light and accommodation. No scleral icterus. Extraocular muscles intact.  HEENT: Head atraumatic, normocephalic. Oropharynx and nasopharynx clear.  NECK:  Supple, no jugular venous  distention. No thyroid enlargement, no tenderness.  LUNGS: Normal breath sounds bilaterally, no wheezing, rales,rhonchi or crepitation. No use of accessory muscles of respiration.  CARDIOVASCULAR: Regular rate and rhythm, S1, S2 normal. No murmurs, rubs, or gallops.  ABDOMEN: Soft, nondistended, nontender. Bowel sounds present. No organomegaly or mass.  EXTREMITIES: No pedal edema, cyanosis, or clubbing.  NEUROLOGIC: Right facial droop, right lower extremity drift and sensory deficit present PSYCHIATRIC: The patient is alert and oriented x 3.  Normal affect and good eye contact. SKIN: No obvious rash, lesion, or ulcer.    Condition at discharge: good  The results of significant diagnostics from this hospitalization (including imaging, microbiology, ancillary and laboratory) are listed below for reference.   Imaging Studies: CT HEAD WO CONTRAST (5MM)  Result Date: 02/20/2021 CLINICAL DATA:  Acute neuro deficit.  Right-sided numbness EXAM: CT HEAD WITHOUT CONTRAST TECHNIQUE: Contiguous axial images were obtained from the base of the skull through the vertex without intravenous contrast. RADIATION DOSE REDUCTION:  This exam was performed according to the departmental dose-optimization program which includes automated exposure control, adjustment of the mA and/or kV according to patient size and/or use of iterative reconstruction technique. COMPARISON:  MRI head 02/18/2021 FINDINGS: Brain: Several small areas of restricted diffusion in the left cerebellum and left cerebral hemisphere on MRI are not identified on CT. No acute cortical infarct or hemorrhage by CT. No mass Chronic infarct left lenticular nuclei unchanged. Ventricle size and cerebral volume normal. Vascular: Negative for hyperdense vessel Skull: Negative Sinuses/Orbits: Paranasal sinuses clear.  Negative orbit Other: None IMPRESSION: Negative for acute infarct or hemorrhage Chronic infarct left anterior basal ganglia. Multiple small areas of  acute infarct noted on recent MRI. These results were called by telephone at the time of interpretation on 02/20/2021 at 1:20 pm to provider [facility] , who verbally acknowledged these results. Electronically Signed   By: Franchot Gallo M.D.   On: 02/20/2021 13:21   MR ANGIO HEAD WO CONTRAST  Result Date: 02/18/2021 CLINICAL DATA:  Initial evaluation for episodic right-sided visual loss. EXAM: MRI HEAD WITHOUT CONTRAST MRA HEAD WITHOUT CONTRAST TECHNIQUE: Multiplanar, multi-echo pulse sequences of the brain and surrounding structures were acquired without intravenous contrast. Angiographic images of the Circle of Willis were acquired using MRA technique without intravenous contrast. COMPARISON:  Prior MRI from 01/29/2019. FINDINGS: MRI HEAD FINDINGS Brain: Cerebral volume within normal limits. Chronic hemorrhagic lacunar infarct present at the left basal ganglia. No other significant cerebral white matter disease for age. There are a few scattered subtle punctate foci of diffusion abnormality involving the left cerebral hemisphere (series 5, images 40, 35, and 32). Additional small focus noted involving the left cerebellum (series 5, image 15). Findings consistent with tiny acute to early subacute ischemic infarcts. These are likely embolic in nature. No associated hemorrhage or mass effect. [name]-white matter differentiation otherwise maintained. No other areas of chronic cortical infarction. No other acute or chronic blood products seen within the brain. No mass lesion, midline shift or mass effect. No hydrocephalus or extra-axial fluid collection. Pituitary gland suprasellar region normal. Midline structures intact. Vascular: Major intracranial vascular flow voids are maintained. Skull and upper cervical spine: Craniocervical junction within normal limits. Bone marrow signal intensity normal. No scalp soft tissue abnormality. Sinuses/Orbits: Globes and orbital soft tissues demonstrate no acute finding.  Paranasal sinuses are clear. No significant mastoid effusion. Inner ear structures grossly normal. Other: None. MRA HEAD FINDINGS Anterior circulation: Visualized distal cervical segments of the internal carotid arteries are widely patent with antegrade flow. Petrous, cavernous, and supraclinoid segments patent without stenosis or other abnormality. Left A1 segment widely patent. Right A1 hypoplastic and/or absent, accounting for the slightly diminutive right ICA is compared to the left. Normal anterior communicating artery complex. Anterior cerebral arteries widely patent without stenosis. No M1 stenosis or occlusion. Normal MCA bifurcations. Distal MCA branches well perfused and symmetric. Posterior circulation: Vertebral arteries are largely code dominant and widely patent to the vertebrobasilar junction. Both PICA origins patent and normal. Basilar patent to its distal aspect without stenosis. Superior cerebellar arteries patent bilaterally. Left PCA primarily supplied via the basilar, although a hypoplastic left posterior communicating artery noted as well. Fetal type origin of the right PCA. Both PCAs well perfused or distal aspects without stenosis. Anatomic variants: Fetal type origin of the right PCA. No aneurysm or other vascular malformation. IMPRESSION: MRI HEAD IMPRESSION: 1. Few scattered punctate foci of diffusion abnormality involving the left cerebral hemisphere and left cerebellum, consistent with tiny acute to  early subacute ischemic infarcts. No associated hemorrhage or mass effect. These are likely embolic in nature. 2. Chronic hemorrhagic lacunar infarct at the left basal ganglia. MRA HEAD IMPRESSION: 1. Normal intracranial MRA. No large vessel occlusion or hemodynamically significant stenosis. 2. Fetal type origin of the right PCA. These results will be called to the ordering clinician or representative by the Radiologist Assistant, and communication documented in the PACS or [employer].  Electronically Signed   By: Jeannine Boga M.D.   On: 02/18/2021 19:02   MR ANGIO NECK WO CONTRAST  Result Date: 02/20/2021 CLINICAL DATA:  Acute neuro deficit. Right-sided weakness and numbness. EXAM: MRI HEAD WITHOUT CONTRAST MRA NECK WITHOUT  CONTRAST TECHNIQUE: Multiplanar, multi-echo pulse sequences of the brain and surrounding structures were acquired without intravenous contrast. Angiographic images of the neck were acquired using MRA technique without intravenous contrast. Carotid stenosis measurements (when applicable) are obtained utilizing NASCET criteria, using the distal internal carotid diameter as the denominator. COMPARISON:  MRI head 02/18/2021 FINDINGS: MRI HEAD FINDINGS Brain: Acute infarct in the right anterior cerebellar vermis is new from the prior study. No change in small areas of restricted diffusion left cerebellum and left frontal and parietal lobes compatible with acute infarct. Chronic infarct left anterior basal ganglia. Ventricle size normal. No hemorrhage or mass. Vascular: Normal arterial flow voids Skull and upper cervical spine: No focal skeletal lesion. Sinuses/Orbits: Paranasal sinuses clear.  Negative orbit Other: None MRA NECK FINDINGS Aortic arch: Antegrade flow in the carotid and vertebral arteries bilaterally. Carotid bifurcation is widely patent bilaterally without stenosis Proximal vertebral arteries are not well seen due to artifact and motion. Mid and distal vertebral arteries are patent bilaterally. IMPRESSION: 1. Subcentimeter acute infarct right anterior cerebellar vermis is new since the MRI 2 days ago. No change in multiple small embolic infarcts in the left cerebral hemisphere and left cerebellum. 2. Chronic infarct left anterior basal ganglia unchanged 3. Negative MRA neck without contrast. Electronically Signed   By: Franchot Gallo M.D.   On: 02/20/2021 15:07   MR BRAIN WO CONTRAST  Result Date: 02/20/2021 CLINICAL DATA:  Acute neuro deficit.  Right-sided weakness and numbness. EXAM: MRI HEAD WITHOUT CONTRAST MRA NECK WITHOUT  CONTRAST TECHNIQUE: Multiplanar, multi-echo pulse sequences of the brain and surrounding structures were acquired without intravenous contrast. Angiographic images of the neck were acquired using MRA technique without intravenous contrast. Carotid stenosis measurements (when applicable) are obtained utilizing NASCET criteria, using the distal internal carotid diameter as the denominator. COMPARISON:  MRI head 02/18/2021 FINDINGS: MRI HEAD FINDINGS Brain: Acute infarct in the right anterior cerebellar vermis is new from the prior study. No change in small areas of restricted diffusion left cerebellum and left frontal and parietal lobes compatible with acute infarct. Chronic infarct left anterior basal ganglia. Ventricle size normal. No hemorrhage or mass. Vascular: Normal arterial flow voids Skull and upper cervical spine: No focal skeletal lesion. Sinuses/Orbits: Paranasal sinuses clear.  Negative orbit Other: None MRA NECK FINDINGS Aortic arch: Antegrade flow in the carotid and vertebral arteries bilaterally. Carotid bifurcation is widely patent bilaterally without stenosis Proximal vertebral arteries are not well seen due to artifact and motion. Mid and distal vertebral arteries are patent bilaterally. IMPRESSION: 1. Subcentimeter acute infarct right anterior cerebellar vermis is new since the MRI 2 days ago. No change in multiple small embolic infarcts in the left cerebral hemisphere and left cerebellum. 2. Chronic infarct left anterior basal ganglia unchanged 3. Negative MRA neck without contrast. Electronically Signed  By: Franchot Gallo M.D.   On: 02/20/2021 15:07   MR BRAIN WO CONTRAST  Result Date: 02/18/2021 CLINICAL DATA:  Initial evaluation for episodic right-sided visual loss. EXAM: MRI HEAD WITHOUT CONTRAST MRA HEAD WITHOUT CONTRAST TECHNIQUE: Multiplanar, multi-echo pulse sequences of the brain and surrounding  structures were acquired without intravenous contrast. Angiographic images of the Circle of Willis were acquired using MRA technique without intravenous contrast. COMPARISON:  Prior MRI from 01/29/2019. FINDINGS: MRI HEAD FINDINGS Brain: Cerebral volume within normal limits. Chronic hemorrhagic lacunar infarct present at the left basal ganglia. No other significant cerebral white matter disease for age. There are a few scattered subtle punctate foci of diffusion abnormality involving the left cerebral hemisphere (series 5, images 40, 35, and 32). Additional small focus noted involving the left cerebellum (series 5, image 15). Findings consistent with tiny acute to early subacute ischemic infarcts. These are likely embolic in nature. No associated hemorrhage or mass effect. [name]-white matter differentiation otherwise maintained. No other areas of chronic cortical infarction. No other acute or chronic blood products seen within the brain. No mass lesion, midline shift or mass effect. No hydrocephalus or extra-axial fluid collection. Pituitary gland suprasellar region normal. Midline structures intact. Vascular: Major intracranial vascular flow voids are maintained. Skull and upper cervical spine: Craniocervical junction within normal limits. Bone marrow signal intensity normal. No scalp soft tissue abnormality. Sinuses/Orbits: Globes and orbital soft tissues demonstrate no acute finding. Paranasal sinuses are clear. No significant mastoid effusion. Inner ear structures grossly normal. Other: None. MRA HEAD FINDINGS Anterior circulation: Visualized distal cervical segments of the internal carotid arteries are widely patent with antegrade flow. Petrous, cavernous, and supraclinoid segments patent without stenosis or other abnormality. Left A1 segment widely patent. Right A1 hypoplastic and/or absent, accounting for the slightly diminutive right ICA is compared to the left. Normal anterior communicating artery complex.  Anterior cerebral arteries widely patent without stenosis. No M1 stenosis or occlusion. Normal MCA bifurcations. Distal MCA branches well perfused and symmetric. Posterior circulation: Vertebral arteries are largely code dominant and widely patent to the vertebrobasilar junction. Both PICA origins patent and normal. Basilar patent to its distal aspect without stenosis. Superior cerebellar arteries patent bilaterally. Left PCA primarily supplied via the basilar, although a hypoplastic left posterior communicating artery noted as well. Fetal type origin of the right PCA. Both PCAs well perfused or distal aspects without stenosis. Anatomic variants: Fetal type origin of the right PCA. No aneurysm or other vascular malformation. IMPRESSION: MRI HEAD IMPRESSION: 1. Few scattered punctate foci of diffusion abnormality involving the left cerebral hemisphere and left cerebellum, consistent with tiny acute to early subacute ischemic infarcts. No associated hemorrhage or mass effect. These are likely embolic in nature. 2. Chronic hemorrhagic lacunar infarct at the left basal ganglia. MRA HEAD IMPRESSION: 1. Normal intracranial MRA. No large vessel occlusion or hemodynamically significant stenosis. 2. Fetal type origin of the right PCA. These results will be called to the ordering clinician or representative by the Radiologist Assistant, and communication documented in the PACS or [employer]. Electronically Signed   By: Jeannine Boga M.D.   On: 02/18/2021 19:02   US Venous Img Lower Bilateral (DVT)  Result Date: 02/20/2021 CLINICAL DATA:  Bilateral leg pain and swelling EXAM: BILATERAL LOWER EXTREMITY VENOUS DOPPLER ULTRASOUND TECHNIQUE: [name]-scale sonography with compression, as well as color and duplex ultrasound, were performed to evaluate the deep venous system(s) from the level of the common femoral vein through the popliteal and proximal calf veins. COMPARISON:  None. FINDINGS: VENOUS Normal  compressibility of the common femoral, superficial femoral, and popliteal veins, as well as the visualized calf veins. Visualized portions of profunda femoral vein and great saphenous vein unremarkable. No filling defects to suggest DVT on grayscale or color Doppler imaging. Doppler waveforms show normal direction of venous flow, normal respiratory plasticity and response to augmentation. OTHER None. Limitations: none IMPRESSION: No findings of lower extremity DVT. Electronically Signed   By: Van Clines M.D.   On: 02/20/2021 15:09   ECHO TEE  Result Date: 02/22/2021    TRANSESOPHOGEAL ECHO REPORT   Patient Name:   Briana [name] Date of Exam: 02/22/2021 Medical Rec #:  [id number]   Height:       63.0 in Accession #:    [id number]  Weight:       171.5 lb Date of Birth:  Oct 21, 1965  BSA:          1.811 m Patient Age:    56 years    BP:           120/66 mmHg Patient Gender: F           HR:           68 bpm. Exam Location:  [facility] Procedure: Transesophageal Echo Indications:    stroke  History:        Patient has no prior history of Echocardiogram examinations.                 Signs/Symptoms:stroke.  Sonographer:    JERRY Referring Phys: [id number] [place]: The transesophogeal probe was passed without difficulty through the esophogus of the patient. Sedation performed by performing physician. The patient developed no complications during the procedure. IMPRESSIONS  1. Left ventricular ejection fraction, by estimation, is 60 to 65%. The left ventricle has normal function.  2. Right ventricular systolic function is normal. The right ventricular size is normal.  3. No left atrial/left atrial appendage thrombus was detected.  4. The mitral valve is normal in structure. Trivial mitral valve regurgitation.  5. The aortic valve is normal in structure. Aortic valve regurgitation is trivial.  6. Cannot exclude a small PFO. Agitated saline contrast bubble study was positive with shunting observed within 3-6  cardiac cycles suggestive of interatrial shunt. FINDINGS  Left Ventricle: Left ventricular ejection fraction, by estimation, is 60 to 65%. The left ventricle has normal function. The left ventricular internal cavity size was normal in size. Right Ventricle: The right ventricular size is normal. No increase in right ventricular wall thickness. Right ventricular systolic function is normal. Left Atrium: Left atrial size was normal in size. No left atrial/left atrial appendage thrombus was detected. Right Atrium: Right atrial size was normal in size. Pericardium: There is no evidence of pericardial effusion. Mitral Valve: The mitral valve is normal in structure. Trivial mitral valve regurgitation. Tricuspid Valve: The tricuspid valve is normal in structure. Tricuspid valve regurgitation is trivial. Aortic Valve: The aortic valve is normal in structure. Aortic valve regurgitation is trivial. Pulmonic Valve: The pulmonic valve was normal in structure. Pulmonic valve regurgitation is trivial. Aorta: The aortic root and ascending aorta are structurally normal, with no evidence of dilitation. There is minimal (Grade I) plaque. IAS/Shunts: There is redundancy of the interatrial septum. Cannot exclude a small PFO. Agitated saline contrast bubble study was positive with shunting observed within 3-6 cardiac cycles suggestive of interatrial shunt. No ventricular septal defect is seen or detected. There is no evidence of an atrial septal  defect. Serafina Royals MD Electronically signed by Serafina Royals MD Signature Date/Time: 02/22/2021/9:05:50 AM    Final     Microbiology: Results for orders placed or performed during the hospital encounter of 02/20/21  Resp Panel by RT-PCR (Flu A&B, Covid) Nasopharyngeal Swab     Status: None   Collection Time: 02/20/21  1:37 PM   Specimen: Nasopharyngeal Swab; Nasopharyngeal(NP) swabs in vial transport medium  Result Value Ref Range Status   SARS Coronavirus 2 by RT PCR NEGATIVE  NEGATIVE Final    Comment: (NOTE) SARS-CoV-2 target nucleic acids are NOT DETECTED.  The SARS-CoV-2 RNA is generally detectable in upper respiratory specimens during the acute phase of infection. The lowest concentration of SARS-CoV-2 viral copies this assay can detect is 138 copies/mL. A negative result does not preclude SARS-Cov-2 infection and should not be used as the sole basis for treatment or other patient management decisions. A negative result may occur with  improper specimen collection/handling, submission of specimen other than nasopharyngeal swab, presence of viral mutation(s) within the areas targeted by this assay, and inadequate number of viral copies(<138 copies/mL). A negative result must be combined with clinical observations, patient history, and epidemiological information. The expected result is Negative.  Fact Sheet for Patients:  EntrepreneurPulse.com.au  Fact Sheet for Healthcare Providers:  IncredibleEmployment.be  This test is no t yet approved or cleared by the [place] FDA and  has been authorized for detection and/or diagnosis of SARS-CoV-2 by FDA under an Emergency Use Authorization (EUA). This EUA will remain  in effect (meaning this test can be used) for the duration of the COVID-19 declaration under Section 564(b)(1) of the Act, 21 U.S.C.section 360bbb-3(b)(1), unless the authorization is terminated  or revoked sooner.       Influenza A by PCR NEGATIVE NEGATIVE Final   Influenza B by PCR NEGATIVE NEGATIVE Final    Comment: (NOTE) The Xpert Xpress SARS-CoV-2/FLU/RSV plus assay is intended as an aid in the diagnosis of influenza from Nasopharyngeal swab specimens and should not be used as a sole basis for treatment. Nasal washings and aspirates are unacceptable for Xpert Xpress SARS-CoV-2/FLU/RSV testing.  Fact Sheet for Patients: EntrepreneurPulse.com.au  Fact Sheet for Healthcare  Providers: IncredibleEmployment.be  This test is not yet approved or cleared by the [place] FDA and has been authorized for detection and/or diagnosis of SARS-CoV-2 by FDA under an Emergency Use Authorization (EUA). This EUA will remain in effect (meaning this test can be used) for the duration of the COVID-19 declaration under Section 564(b)(1) of the Act, 21 U.S.C. section 360bbb-3(b)(1), unless the authorization is terminated or revoked.  Performed at [facility], [address] [place], [place] [address]     Labs: CBC: Recent Labs  Lab 02/20/21 1337  WBC 9.6  NEUTROABS 6.7  HGB 13.7  HCT 42.3  MCV 99.5  PLT 836   Basic Metabolic Panel: Recent Labs  Lab 02/20/21 1337  NA 137  K 3.8  CL 106  CO2 23  GLUCOSE 98  BUN 14  CREATININE 1.05*  CALCIUM 9.2   Liver Function Tests: Recent Labs  Lab 02/20/21 1337  AST 14*  ALT 11  ALKPHOS 84  BILITOT 0.3  PROT 7.4  ALBUMIN 3.8   CBG: Recent Labs  Lab 02/20/21 1248  GLUCAP 88    Discharge time spent: greater than 30 minutes.  Signed: [name], MD [facility] 02/24/2021

## 2021-02-26 ENCOUNTER — Encounter: Payer: Self-pay | Admitting: Internal Medicine

## 2021-03-01 DIAGNOSIS — G894 Chronic pain syndrome: Secondary | ICD-10-CM | POA: Diagnosis not present

## 2021-03-01 DIAGNOSIS — G8929 Other chronic pain: Secondary | ICD-10-CM | POA: Diagnosis not present

## 2021-03-01 DIAGNOSIS — M5030 Other cervical disc degeneration, unspecified cervical region: Secondary | ICD-10-CM | POA: Diagnosis not present

## 2021-03-02 DIAGNOSIS — Z720 Tobacco use: Secondary | ICD-10-CM | POA: Diagnosis not present

## 2021-03-02 DIAGNOSIS — B029 Zoster without complications: Secondary | ICD-10-CM | POA: Diagnosis not present

## 2021-03-02 DIAGNOSIS — J449 Chronic obstructive pulmonary disease, unspecified: Secondary | ICD-10-CM | POA: Diagnosis not present

## 2021-03-02 DIAGNOSIS — F17209 Nicotine dependence, unspecified, with unspecified nicotine-induced disorders: Secondary | ICD-10-CM | POA: Diagnosis not present

## 2021-03-02 DIAGNOSIS — I63119 Cerebral infarction due to embolism of unspecified vertebral artery: Secondary | ICD-10-CM | POA: Diagnosis not present

## 2021-03-02 DIAGNOSIS — G8929 Other chronic pain: Secondary | ICD-10-CM | POA: Diagnosis not present

## 2021-03-02 DIAGNOSIS — G459 Transient cerebral ischemic attack, unspecified: Secondary | ICD-10-CM | POA: Diagnosis not present

## 2021-03-04 DIAGNOSIS — J449 Chronic obstructive pulmonary disease, unspecified: Secondary | ICD-10-CM | POA: Diagnosis not present

## 2021-03-05 DIAGNOSIS — F17200 Nicotine dependence, unspecified, uncomplicated: Secondary | ICD-10-CM | POA: Diagnosis not present

## 2021-03-05 DIAGNOSIS — I6340 Cerebral infarction due to embolism of unspecified cerebral artery: Secondary | ICD-10-CM | POA: Diagnosis not present

## 2021-03-05 DIAGNOSIS — G43119 Migraine with aura, intractable, without status migrainosus: Secondary | ICD-10-CM | POA: Diagnosis not present

## 2021-03-05 DIAGNOSIS — G4733 Obstructive sleep apnea (adult) (pediatric): Secondary | ICD-10-CM | POA: Diagnosis not present

## 2021-03-05 DIAGNOSIS — H539 Unspecified visual disturbance: Secondary | ICD-10-CM | POA: Diagnosis not present

## 2021-03-11 ENCOUNTER — Ambulatory Visit
Admission: RE | Admit: 2021-03-11 | Discharge: 2021-03-11 | Disposition: A | Discharge status: Home / Self Care | Payer: Medicare Other | Source: Ambulatory Visit | Attending: Radiation Oncology | Admitting: Radiation Oncology

## 2021-03-11 ENCOUNTER — Other Ambulatory Visit: Payer: Self-pay

## 2021-03-11 VITALS — BP 124/81 | HR 101 | Temp 98.0°F | Resp 20 | Ht 63.0 in | Wt 170.7 lb

## 2021-03-11 DIAGNOSIS — Z923 Personal history of irradiation: Secondary | ICD-10-CM | POA: Diagnosis not present

## 2021-03-11 DIAGNOSIS — C50412 Malignant neoplasm of upper-outer quadrant of left female breast: Secondary | ICD-10-CM | POA: Diagnosis not present

## 2021-03-11 DIAGNOSIS — Z79811 Long term (current) use of aromatase inhibitors: Secondary | ICD-10-CM | POA: Insufficient documentation

## 2021-03-11 DIAGNOSIS — Z170 Estrogen receptor positive status [ER+]: Secondary | ICD-10-CM

## 2021-03-11 DIAGNOSIS — Z08 Encounter for follow-up examination after completed treatment for malignant neoplasm: Secondary | ICD-10-CM | POA: Diagnosis not present

## 2021-03-11 NOTE — Progress Notes (Signed)
Radiation Oncology Follow up Note  Name: Briana [name]   Date:   03/11/2021 MRN:  [id number] DOB: [birth year]    This 56 y.o. 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  She is currently on anastrozole.  She had mammograms back in March which were BI-RADS 2 benign which I reviewed.  Of also reviewed her recent MRI scans  COMPLICATIONS OF TREATMENT: none  FOLLOW UP COMPLIANCE: keeps appointments   PHYSICAL EXAM:  BP 124/81    Pulse (!) 101    Temp 98 F (36.7 C)    Resp 20    Ht 5\' 3"  (1.6 m)    Wt 170 lb 11.2 oz (77.4 kg)    LMP 08/24/2008    BMI 30.24 kg/m  Lungs are clear to A&P cardiac examination essentially unremarkable with regular rate and rhythm. No dominant mass or nodularity is noted in either breast in 2 positions examined. Incision is well-healed. No axillary or supraclavicular adenopathy is appreciated. Cosmetic result is excellent.  Well-developed well-nourished patient in NAD. HEENT reveals PERLA, EOMI, discs not visualized.  Oral cavity is clear. No oral mucosal lesions are identified. Neck is clear without evidence of cervical or supraclavicular adenopathy. Lungs are clear to A&P. Cardiac examination is essentially unremarkable with regular rate and rhythm without murmur rub or thrill. Abdomen is benign with no organomegaly or masses noted. Motor sensory and DTR levels are equal and symmetric in the upper and lower extremities. Cranial nerves II  through XII are grossly intact. Proprioception is intact. No peripheral adenopathy or edema is identified. No motor or sensory levels are noted. Crude visual fields are within normal range.  RADIOLOGY RESULTS: Brain MRIs as well as mammograms reviewed compatible with above-stated findings  PLAN: Present time or sending a secure chat to Dr. Gary Fleet team to see if they want her to hold her anastrozole.  She is a follow-up with him in several weeks.  Otherwise I am going to turn this follow-up care over to medical oncology.  I would be happy to reevaluate the patient anytime the future should that be indicated.  I would like to take this opportunity to thank you for allowing me to participate in the care of your patient.Noreene Filbert, MD

## 2021-03-23 DIAGNOSIS — H53482 Generalized contraction of visual field, left eye: Secondary | ICD-10-CM | POA: Diagnosis not present

## 2021-03-29 DIAGNOSIS — G8929 Other chronic pain: Secondary | ICD-10-CM | POA: Diagnosis not present

## 2021-03-29 DIAGNOSIS — G894 Chronic pain syndrome: Secondary | ICD-10-CM | POA: Diagnosis not present

## 2021-03-29 DIAGNOSIS — M5030 Other cervical disc degeneration, unspecified cervical region: Secondary | ICD-10-CM | POA: Diagnosis not present

## 2021-03-29 DIAGNOSIS — M5136 Other intervertebral disc degeneration, lumbar region: Secondary | ICD-10-CM | POA: Diagnosis not present

## 2021-04-01 DIAGNOSIS — J449 Chronic obstructive pulmonary disease, unspecified: Secondary | ICD-10-CM | POA: Diagnosis not present

## 2021-04-02 DIAGNOSIS — J309 Allergic rhinitis, unspecified: Secondary | ICD-10-CM | POA: Diagnosis not present

## 2021-04-02 DIAGNOSIS — D509 Iron deficiency anemia, unspecified: Secondary | ICD-10-CM | POA: Diagnosis not present

## 2021-04-02 DIAGNOSIS — G4700 Insomnia, unspecified: Secondary | ICD-10-CM | POA: Diagnosis not present

## 2021-04-02 DIAGNOSIS — R7303 Prediabetes: Secondary | ICD-10-CM | POA: Diagnosis not present

## 2021-04-02 DIAGNOSIS — I10 Essential (primary) hypertension: Secondary | ICD-10-CM | POA: Diagnosis not present

## 2021-04-02 DIAGNOSIS — E785 Hyperlipidemia, unspecified: Secondary | ICD-10-CM | POA: Diagnosis not present

## 2021-04-02 DIAGNOSIS — L709 Acne, unspecified: Secondary | ICD-10-CM | POA: Diagnosis not present

## 2021-04-02 DIAGNOSIS — N750 Cyst of Bartholin's gland: Secondary | ICD-10-CM | POA: Diagnosis not present

## 2021-04-19 DIAGNOSIS — N1831 Chronic kidney disease, stage 3a: Secondary | ICD-10-CM | POA: Diagnosis not present

## 2021-04-26 ENCOUNTER — Other Ambulatory Visit: Payer: Self-pay

## 2021-04-26 ENCOUNTER — Ambulatory Visit
Admission: RE | Admit: 2021-04-26 | Discharge: 2021-04-26 | Disposition: A | Discharge status: Home / Self Care | Payer: Medicare Other | Source: Ambulatory Visit | Attending: Nurse Practitioner | Admitting: Nurse Practitioner

## 2021-04-26 DIAGNOSIS — Z1231 Encounter for screening mammogram for malignant neoplasm of breast: Secondary | ICD-10-CM | POA: Diagnosis not present

## 2021-04-26 DIAGNOSIS — C50412 Malignant neoplasm of upper-outer quadrant of left female breast: Secondary | ICD-10-CM | POA: Diagnosis not present

## 2021-04-26 DIAGNOSIS — Z853 Personal history of malignant neoplasm of breast: Secondary | ICD-10-CM | POA: Insufficient documentation

## 2021-04-26 DIAGNOSIS — Z170 Estrogen receptor positive status [ER+]: Secondary | ICD-10-CM | POA: Insufficient documentation

## 2021-04-26 DIAGNOSIS — Z08 Encounter for follow-up examination after completed treatment for malignant neoplasm: Secondary | ICD-10-CM | POA: Diagnosis not present

## 2021-04-27 DIAGNOSIS — G8929 Other chronic pain: Secondary | ICD-10-CM | POA: Diagnosis not present

## 2021-04-27 DIAGNOSIS — M47897 Other spondylosis, lumbosacral region: Secondary | ICD-10-CM | POA: Diagnosis not present

## 2021-04-27 DIAGNOSIS — G4700 Insomnia, unspecified: Secondary | ICD-10-CM | POA: Diagnosis not present

## 2021-04-27 DIAGNOSIS — M792 Neuralgia and neuritis, unspecified: Secondary | ICD-10-CM | POA: Diagnosis not present

## 2021-04-27 DIAGNOSIS — R519 Headache, unspecified: Secondary | ICD-10-CM | POA: Diagnosis not present

## 2021-04-27 DIAGNOSIS — M5136 Other intervertebral disc degeneration, lumbar region: Secondary | ICD-10-CM | POA: Diagnosis not present

## 2021-04-27 DIAGNOSIS — M9951 Intervertebral disc stenosis of neural canal of cervical region: Secondary | ICD-10-CM | POA: Diagnosis not present

## 2021-04-27 DIAGNOSIS — E0840 Diabetes mellitus due to underlying condition with diabetic neuropathy, unspecified: Secondary | ICD-10-CM | POA: Diagnosis not present

## 2021-04-27 DIAGNOSIS — M48062 Spinal stenosis, lumbar region with neurogenic claudication: Secondary | ICD-10-CM | POA: Diagnosis not present

## 2021-04-27 DIAGNOSIS — M5030 Other cervical disc degeneration, unspecified cervical region: Secondary | ICD-10-CM | POA: Diagnosis not present

## 2021-04-27 DIAGNOSIS — G43001 Migraine without aura, not intractable, with status migrainosus: Secondary | ICD-10-CM | POA: Diagnosis not present

## 2021-04-27 DIAGNOSIS — M25559 Pain in unspecified hip: Secondary | ICD-10-CM | POA: Diagnosis not present

## 2021-04-27 DIAGNOSIS — M25519 Pain in unspecified shoulder: Secondary | ICD-10-CM | POA: Diagnosis not present

## 2021-04-27 DIAGNOSIS — G894 Chronic pain syndrome: Secondary | ICD-10-CM | POA: Diagnosis not present

## 2021-04-27 NOTE — Progress Notes (Signed)
?[place]  ?[phone] [phone] ? ?ID: Briana [name] OB: [birth year]  MR#: [id number]  ONG#:[id number] ? ?Patient Care Team: ?[name], MD as PCP - General (Internal Medicine) ?Rico Junker, RN as Oncology Nurse Navigator ?Lloyd Huger, MD as Consulting Physician (Oncology) ?Vickie Epley, MD (Inactive) as Consulting Physician (General Surgery) ?Noreene Filbert, MD as Referring Physician (Radiation Oncology) ?Teodoro Spray, MD as Consulting Physician (Cardiology) ? ?CHIEF COMPLAINT: Clinical stage Ia ER/PR positive, HER-2 over-expressing invasive carcinoma of the upper outer quadrant of the left breast. ? ?INTERVAL HISTORY: Patient returns to clinic today for routine 72-monthevaluation.  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  She has no residual neurologic deficits.  She currently feels well and is asymptomatic.  She has no neurologic complaints.  She denies any recent fevers or illnesses.  She has a good appetite and denies weight loss.  She has no chest pain, cough, hemoptysis, or shortness of breath. She denies any nausea, vomiting, constipation, or diarrhea.  She has no urinary complaints.  Patient offers no further specific complaints today. ? ?REVIEW OF SYSTEMS:   ?Review of Systems  ?Constitutional: Negative.  Negative for fever, malaise/fatigue and weight loss.  ?Respiratory: Negative.  Negative for cough, hemoptysis and shortness of breath.   ?Cardiovascular: Negative.  Negative for chest pain, palpitations and leg swelling.  ?Gastrointestinal: Negative.  Negative for abdominal pain.  ?Genitourinary: Negative.  Negative for dysuria.  ?Musculoskeletal: Negative.  Negative for joint pain.  ?Skin: Negative.  Negative for itching and rash.  ?Neurological: Negative.  Negative for dizziness, sensory change, focal weakness, loss of consciousness, weakness and headaches.   ?Psychiatric/Behavioral: Negative.  The patient is not nervous/anxious and does not have insomnia.   ? ?As per HPI. Otherwise, a complete review of systems is negative. ? ?PAST MEDICAL HISTORY: ?Past Medical History:  ?Diagnosis Date  ? Abnormal Pap smear of cervix   ? 01/2015 ascus/neg- 04/2015 ascus/neg  ? Allergy   ? Anxiety   ? Arthritis   ? Asthma   ? [facility] [place] [place]  ? Chronic kidney disease   ? [facility] [place]   ? DDD (degenerative disc disease), lumbar   ? Depression   ? Elevated lipids   ? Fibromyalgia   ? Fibromyalgia   ? Genetic testing 03/09/2017  ? Multi-Cancer panel (83 genes) @ [facility] - No pathogenic mutations detected  ? GERD (gastroesophageal reflux disease)   ? History of IBS   ? Hyperthyroidism   ? Joint disease   ? Migraine   ? Migraine   ? Oxygen deficiency   ? Personal history of chemotherapy   ? Personal history of radiation therapy   ? Restless leg syndrome   ? Sleep apnea   ? Does not use C-PAP, cannot tolerate mask  ? ? ?PAST SURGICAL HISTORY: ?Past Surgical History:  ?Procedure Laterality Date  ? ABDOMINAL HYSTERECTOMY  2008  ? BREAST BIOPSY Left 02/02/2017  ? uKoreacore positive  ? BREAST LUMPECTOMY Left 02/17/2017  ? BREAST LUMPECTOMY WITH NEEDLE LOCALIZATION Left 02/17/2017  ? Procedure: BREAST LUMPECTOMY WITH NEEDLE LOCALIZATION;  Surgeon: DVickie Epley MD;  Location: [facility];  Service: General;  Laterality: Left;  ? CARPAL TUNNEL RELEASE Bilateral   ? cryotherapy    ? DILATION AND CURETTAGE OF UTERUS    ? ENDOMETRIAL ABLATION    ? LAPAROSCOPIC OOPHERECTOMY Left   ?  unsure which side but thinks its the left  ? LYSIS OF ADHESION Left 10/14/2015  ? Procedure: LYSIS OF ADHESION;  Surgeon: Thornton Park, MD;  Location: [facility];  Service: Orthopedics;  Laterality: Left;  ? NECK SURGERY    ? lower neck fusion rods and screws  ? OOPHORECTOMY    ? one ovary removed   ? PORTA CATH INSERTION N/A 03/08/2017  ? Procedure: PORTA CATH INSERTION;   Surgeon: Katha Cabal, MD;  Location: [facility];  Service: Cardiovascular;  Laterality: N/A;  ? RESECTION DISTAL CLAVICAL Left 10/14/2015  ? Procedure: RESECTION DISTAL CLAVICAL;  Surgeon: Thornton Park, MD;  Location: [facility];  Service: Orthopedics;  Laterality: Left;  ? SENTINEL NODE BIOPSY Left 02/17/2017  ? Procedure: SENTINEL NODE BIOPSY;  Surgeon: Vickie Epley, MD;  Location: [facility];  Service: General;  Laterality: Left;  ? SHOULDER ARTHROSCOPY WITH OPEN ROTATOR CUFF REPAIR Left 10/14/2015  ? Procedure: SHOULDER ARTHROSCOPY WITH OPEN ROTATOR CUFF REPAIR;  Surgeon: Thornton Park, MD;  Location: [facility];  Service: Orthopedics;  Laterality: Left;  ? SHOULDER ARTHROSCOPY WITH OPEN ROTATOR CUFF REPAIR AND DISTAL CLAVICLE ACROMINECTOMY Right 09/03/2014  ? Procedure: RIGHT SHOULDER ARTHROSCOPY WITH MINI OPEN ROTATOR CUFF TEAR;  Surgeon: Thornton Park, MD;  Location: [facility];  Service: Orthopedics;  Laterality: Right;  biceps tenodesis, arthroscopic subacromial decompression and distal clavicle incision  ? spinal injections    ? SUBACROMIAL DECOMPRESSION Left 10/14/2015  ? Procedure: SUBACROMIAL DECOMPRESSION;  Surgeon: Thornton Park, MD;  Location: [facility];  Service: Orthopedics;  Laterality: Left;  ? TEE WITHOUT CARDIOVERSION N/A 02/22/2021  ? Procedure: TRANSESOPHAGEAL ECHOCARDIOGRAM (TEE);  Surgeon: [name], MD;  Location: [facility];  Service: Cardiovascular;  Laterality: N/A;  ? ? ?FAMILY HISTORY: ?Family History  ?Problem Relation Age of Onset  ? Breast cancer Mother 52  ?     Glioblastoma also; deceased at 3  ? Diabetes Father   ? Lung cancer Father   ?     mesothelioma; deceased 51s  ? Cancer Maternal Aunt   ?     "bone ca"; unk. primary  ? Colon cancer Maternal Grandfather   ?     dx in 69s; deceased 76  ? Colon cancer Maternal Aunt   ?     dx in 70s; currently 16s  ? Lung cancer Maternal Grandmother   ?     smoker; deceased 65  ? Lung cancer Paternal Grandmother   ?      smoker; deceased 82s  ? Breast cancer Cousin   ?     dx 40s; daughter of mat aunt with unk. primary cancer  ? Cancer Other   ?     distant cousin; unknown primary  ? Colon cancer Other   ?     dx 62s; currently 3; maternal half-sister  ? Bipolar disorder Sister   ? Schizophrenia Sister   ? ? ?ADVANCED DIRECTIVES (Y/N):  N ? ?HEALTH MAINTENANCE: ?Social History  ? ?Tobacco Use  ? Smoking status: Every Day  ?  Packs/day: 1.00  ?  Types: Cigarettes  ? Smokeless tobacco: Never  ?Vaping Use  ? Vaping Use: Never used  ?Substance Use Topics  ? Alcohol use: No  ?  Alcohol/week: 0.0 standard drinks  ? Drug use: No  ? ? ? Colonoscopy: ? PAP: ? Bone density: ? Lipid panel: ? ?Allergies  ?Allergen Reactions  ? Iodine   ? Bee Venom Hives and Rash  ? Cefoxitin  Rash  ? Cefuroxime Axetil Hives and Rash  ? Cucumber Extract Rash  ? Gadolinium Derivatives Swelling, Other (See Comments) and Rash  ?  NECK BECAME RED AND TONGUE WAS SWELLING SLIGHTLY PER PT ?NECK BECAME RED AND TONGUE WAS SWELLING SLIGHTLY PER PT ?NECK BECAME RED AND TONGUE WAS SWELLING SLIGHTLY PER PT  ? Iodinated Contrast Media Hives and Rash  ? Latex Rash  ? Other Hives and Rash  ?  Bee Stings-swelling/hives/rash ?Wool (textile fiber)-Rash/itching  ? Tomato Rash  ?  Red tomatoes  ? ? ?Current Outpatient Medications  ?Medication Sig Dispense Refill  ? aspirin EC 81 MG EC tablet Take 1 tablet (81 mg total) by mouth daily. Swallow whole. 30 tablet 11  ? baclofen (LIORESAL) 10 MG tablet Take 10 mg by mouth 3 (three) times daily as needed for muscle spasms (typically 2-3 times daily).     ? calcium carbonate (OS-CAL) 600 MG TABS tablet Take by mouth.    ? cholecalciferol (VITAMIN D) 1000 UNITS tablet Take 1,000 Units by mouth 2 (two) times daily.     ? clindamycin-benzoyl peroxide (BENZACLIN) gel SMARTSIG:Sparingly Topical Twice Daily    ? diclofenac Sodium (VOLTAREN) 1 % GEL SMARTSIG:2-4 Gram(s) Topical 4 Times Daily    ? Multiple Vitamins-Minerals (HAIR SKIN AND NAILS  FORMULA) TABS Take 2 tablets by mouth daily.    ? OXYCODONE HCL PO Take 15 mg by mouth as needed. Take 4-6 times a day as needed.    ? rOPINIRole (REQUIP) 1 MG tablet Take 1.5 mg by mouth at bedtime.    ? rosuvastatin (CRES

## 2021-04-28 DIAGNOSIS — Z170 Estrogen receptor positive status [ER+]: Secondary | ICD-10-CM | POA: Diagnosis not present

## 2021-04-28 DIAGNOSIS — I63119 Cerebral infarction due to embolism of unspecified vertebral artery: Secondary | ICD-10-CM | POA: Diagnosis not present

## 2021-04-28 DIAGNOSIS — C50412 Malignant neoplasm of upper-outer quadrant of left female breast: Secondary | ICD-10-CM | POA: Diagnosis not present

## 2021-04-28 DIAGNOSIS — G4730 Sleep apnea, unspecified: Secondary | ICD-10-CM | POA: Diagnosis not present

## 2021-04-28 DIAGNOSIS — Z720 Tobacco use: Secondary | ICD-10-CM | POA: Diagnosis not present

## 2021-04-29 ENCOUNTER — Inpatient Hospital Stay: Payer: Medicare Other | Attending: Oncology | Admitting: Oncology

## 2021-04-29 ENCOUNTER — Encounter: Payer: Self-pay | Admitting: Oncology

## 2021-04-29 VITALS — BP 99/76 | HR 106 | Temp 97.6°F | Resp 18 | Wt 170.8 lb

## 2021-04-29 DIAGNOSIS — F17210 Nicotine dependence, cigarettes, uncomplicated: Secondary | ICD-10-CM | POA: Insufficient documentation

## 2021-04-29 DIAGNOSIS — Z170 Estrogen receptor positive status [ER+]: Secondary | ICD-10-CM | POA: Diagnosis not present

## 2021-04-29 DIAGNOSIS — F419 Anxiety disorder, unspecified: Secondary | ICD-10-CM | POA: Diagnosis not present

## 2021-04-29 DIAGNOSIS — Z8673 Personal history of transient ischemic attack (TIA), and cerebral infarction without residual deficits: Secondary | ICD-10-CM | POA: Diagnosis not present

## 2021-04-29 DIAGNOSIS — Z79899 Other long term (current) drug therapy: Secondary | ICD-10-CM | POA: Diagnosis not present

## 2021-04-29 DIAGNOSIS — Z9221 Personal history of antineoplastic chemotherapy: Secondary | ICD-10-CM | POA: Diagnosis not present

## 2021-04-29 DIAGNOSIS — M8580 Other specified disorders of bone density and structure, unspecified site: Secondary | ICD-10-CM | POA: Insufficient documentation

## 2021-04-29 DIAGNOSIS — Z79811 Long term (current) use of aromatase inhibitors: Secondary | ICD-10-CM

## 2021-04-29 DIAGNOSIS — C50412 Malignant neoplasm of upper-outer quadrant of left female breast: Secondary | ICD-10-CM | POA: Diagnosis not present

## 2021-05-02 DIAGNOSIS — J449 Chronic obstructive pulmonary disease, unspecified: Secondary | ICD-10-CM | POA: Diagnosis not present

## 2021-05-05 DIAGNOSIS — G4733 Obstructive sleep apnea (adult) (pediatric): Secondary | ICD-10-CM | POA: Diagnosis not present

## 2021-05-05 DIAGNOSIS — I6340 Cerebral infarction due to embolism of unspecified cerebral artery: Secondary | ICD-10-CM | POA: Diagnosis not present

## 2021-05-05 DIAGNOSIS — G43119 Migraine with aura, intractable, without status migrainosus: Secondary | ICD-10-CM | POA: Diagnosis not present

## 2021-05-19 DIAGNOSIS — G894 Chronic pain syndrome: Secondary | ICD-10-CM | POA: Diagnosis not present

## 2021-05-19 DIAGNOSIS — M5136 Other intervertebral disc degeneration, lumbar region: Secondary | ICD-10-CM | POA: Diagnosis not present

## 2021-05-19 DIAGNOSIS — M5030 Other cervical disc degeneration, unspecified cervical region: Secondary | ICD-10-CM | POA: Diagnosis not present

## 2021-05-19 DIAGNOSIS — M5134 Other intervertebral disc degeneration, thoracic region: Secondary | ICD-10-CM | POA: Diagnosis not present

## 2021-06-01 DIAGNOSIS — J449 Chronic obstructive pulmonary disease, unspecified: Secondary | ICD-10-CM | POA: Diagnosis not present

## 2021-06-02 DIAGNOSIS — I63119 Cerebral infarction due to embolism of unspecified vertebral artery: Secondary | ICD-10-CM | POA: Diagnosis not present

## 2021-06-17 DIAGNOSIS — I63119 Cerebral infarction due to embolism of unspecified vertebral artery: Secondary | ICD-10-CM | POA: Diagnosis not present

## 2021-06-29 DIAGNOSIS — M5134 Other intervertebral disc degeneration, thoracic region: Secondary | ICD-10-CM | POA: Diagnosis not present

## 2021-06-29 DIAGNOSIS — M5030 Other cervical disc degeneration, unspecified cervical region: Secondary | ICD-10-CM | POA: Diagnosis not present

## 2021-06-29 DIAGNOSIS — G894 Chronic pain syndrome: Secondary | ICD-10-CM | POA: Diagnosis not present

## 2021-07-02 DIAGNOSIS — J449 Chronic obstructive pulmonary disease, unspecified: Secondary | ICD-10-CM | POA: Diagnosis not present

## 2021-07-28 DIAGNOSIS — M259 Joint disorder, unspecified: Secondary | ICD-10-CM | POA: Diagnosis not present

## 2021-07-28 DIAGNOSIS — M5481 Occipital neuralgia: Secondary | ICD-10-CM | POA: Diagnosis not present

## 2021-07-28 DIAGNOSIS — M5134 Other intervertebral disc degeneration, thoracic region: Secondary | ICD-10-CM | POA: Diagnosis not present

## 2021-07-28 DIAGNOSIS — G894 Chronic pain syndrome: Secondary | ICD-10-CM | POA: Diagnosis not present

## 2021-07-28 DIAGNOSIS — M5030 Other cervical disc degeneration, unspecified cervical region: Secondary | ICD-10-CM | POA: Diagnosis not present

## 2021-07-30 DIAGNOSIS — M5136 Other intervertebral disc degeneration, lumbar region: Secondary | ICD-10-CM | POA: Diagnosis not present

## 2021-07-30 DIAGNOSIS — I10 Essential (primary) hypertension: Secondary | ICD-10-CM | POA: Diagnosis not present

## 2021-07-30 DIAGNOSIS — E785 Hyperlipidemia, unspecified: Secondary | ICD-10-CM | POA: Diagnosis not present

## 2021-08-01 DIAGNOSIS — J449 Chronic obstructive pulmonary disease, unspecified: Secondary | ICD-10-CM | POA: Diagnosis not present

## 2021-08-25 DIAGNOSIS — M5030 Other cervical disc degeneration, unspecified cervical region: Secondary | ICD-10-CM | POA: Diagnosis not present

## 2021-08-25 DIAGNOSIS — M5136 Other intervertebral disc degeneration, lumbar region: Secondary | ICD-10-CM | POA: Diagnosis not present

## 2021-08-25 DIAGNOSIS — M5134 Other intervertebral disc degeneration, thoracic region: Secondary | ICD-10-CM | POA: Diagnosis not present

## 2021-08-25 DIAGNOSIS — G894 Chronic pain syndrome: Secondary | ICD-10-CM | POA: Diagnosis not present

## 2021-08-25 DIAGNOSIS — M5481 Occipital neuralgia: Secondary | ICD-10-CM | POA: Diagnosis not present

## 2021-08-25 DIAGNOSIS — M259 Joint disorder, unspecified: Secondary | ICD-10-CM | POA: Diagnosis not present

## 2021-09-01 DIAGNOSIS — J449 Chronic obstructive pulmonary disease, unspecified: Secondary | ICD-10-CM | POA: Diagnosis not present

## 2021-09-09 DIAGNOSIS — H539 Unspecified visual disturbance: Secondary | ICD-10-CM | POA: Diagnosis not present

## 2021-09-09 DIAGNOSIS — E782 Mixed hyperlipidemia: Secondary | ICD-10-CM | POA: Diagnosis not present

## 2021-09-09 DIAGNOSIS — I5032 Chronic diastolic (congestive) heart failure: Secondary | ICD-10-CM | POA: Diagnosis not present

## 2021-09-09 DIAGNOSIS — I63119 Cerebral infarction due to embolism of unspecified vertebral artery: Secondary | ICD-10-CM | POA: Diagnosis not present

## 2021-09-09 DIAGNOSIS — G43119 Migraine with aura, intractable, without status migrainosus: Secondary | ICD-10-CM | POA: Diagnosis not present

## 2021-09-09 DIAGNOSIS — G4730 Sleep apnea, unspecified: Secondary | ICD-10-CM | POA: Diagnosis not present

## 2021-09-09 DIAGNOSIS — Z720 Tobacco use: Secondary | ICD-10-CM | POA: Diagnosis not present

## 2021-09-09 DIAGNOSIS — G4733 Obstructive sleep apnea (adult) (pediatric): Secondary | ICD-10-CM | POA: Diagnosis not present

## 2021-09-09 DIAGNOSIS — N182 Chronic kidney disease, stage 2 (mild): Secondary | ICD-10-CM | POA: Diagnosis not present

## 2021-09-09 DIAGNOSIS — I6340 Cerebral infarction due to embolism of unspecified cerebral artery: Secondary | ICD-10-CM | POA: Diagnosis not present

## 2021-09-09 DIAGNOSIS — I10 Essential (primary) hypertension: Secondary | ICD-10-CM | POA: Diagnosis not present

## 2021-09-10 DIAGNOSIS — I639 Cerebral infarction, unspecified: Secondary | ICD-10-CM | POA: Diagnosis not present

## 2021-09-21 DIAGNOSIS — G894 Chronic pain syndrome: Secondary | ICD-10-CM | POA: Diagnosis not present

## 2021-09-21 DIAGNOSIS — M259 Joint disorder, unspecified: Secondary | ICD-10-CM | POA: Diagnosis not present

## 2021-09-21 DIAGNOSIS — M5134 Other intervertebral disc degeneration, thoracic region: Secondary | ICD-10-CM | POA: Diagnosis not present

## 2021-09-21 DIAGNOSIS — M5481 Occipital neuralgia: Secondary | ICD-10-CM | POA: Diagnosis not present

## 2021-09-21 DIAGNOSIS — M5136 Other intervertebral disc degeneration, lumbar region: Secondary | ICD-10-CM | POA: Diagnosis not present

## 2021-09-21 DIAGNOSIS — M5030 Other cervical disc degeneration, unspecified cervical region: Secondary | ICD-10-CM | POA: Diagnosis not present

## 2021-09-22 ENCOUNTER — Ambulatory Visit
Admission: RE | Admit: 2021-09-22 | Discharge: 2021-09-22 | Disposition: A | Discharge status: Home / Self Care | Payer: Medicare Other | Source: Ambulatory Visit | Attending: Oncology | Admitting: Oncology

## 2021-09-22 DIAGNOSIS — F17210 Nicotine dependence, cigarettes, uncomplicated: Secondary | ICD-10-CM | POA: Diagnosis not present

## 2021-09-22 DIAGNOSIS — Z79811 Long term (current) use of aromatase inhibitors: Secondary | ICD-10-CM | POA: Diagnosis not present

## 2021-09-22 DIAGNOSIS — M069 Rheumatoid arthritis, unspecified: Secondary | ICD-10-CM | POA: Diagnosis not present

## 2021-09-22 DIAGNOSIS — M85851 Other specified disorders of bone density and structure, right thigh: Secondary | ICD-10-CM | POA: Diagnosis not present

## 2021-09-29 DIAGNOSIS — L738 Other specified follicular disorders: Secondary | ICD-10-CM | POA: Diagnosis not present

## 2021-09-29 DIAGNOSIS — Z872 Personal history of diseases of the skin and subcutaneous tissue: Secondary | ICD-10-CM | POA: Diagnosis not present

## 2021-09-29 DIAGNOSIS — L218 Other seborrheic dermatitis: Secondary | ICD-10-CM | POA: Diagnosis not present

## 2021-09-29 DIAGNOSIS — L280 Lichen simplex chronicus: Secondary | ICD-10-CM | POA: Diagnosis not present

## 2021-09-29 DIAGNOSIS — L578 Other skin changes due to chronic exposure to nonionizing radiation: Secondary | ICD-10-CM | POA: Diagnosis not present

## 2021-09-29 DIAGNOSIS — Z86018 Personal history of other benign neoplasm: Secondary | ICD-10-CM | POA: Diagnosis not present

## 2021-09-30 DIAGNOSIS — M5136 Other intervertebral disc degeneration, lumbar region: Secondary | ICD-10-CM | POA: Diagnosis not present

## 2021-09-30 DIAGNOSIS — I10 Essential (primary) hypertension: Secondary | ICD-10-CM | POA: Diagnosis not present

## 2021-09-30 DIAGNOSIS — E785 Hyperlipidemia, unspecified: Secondary | ICD-10-CM | POA: Diagnosis not present

## 2021-10-01 DIAGNOSIS — I10 Essential (primary) hypertension: Secondary | ICD-10-CM | POA: Diagnosis not present

## 2021-10-01 DIAGNOSIS — D509 Iron deficiency anemia, unspecified: Secondary | ICD-10-CM | POA: Diagnosis not present

## 2021-10-01 DIAGNOSIS — R7303 Prediabetes: Secondary | ICD-10-CM | POA: Diagnosis not present

## 2021-10-01 DIAGNOSIS — E785 Hyperlipidemia, unspecified: Secondary | ICD-10-CM | POA: Diagnosis not present

## 2021-10-02 DIAGNOSIS — J449 Chronic obstructive pulmonary disease, unspecified: Secondary | ICD-10-CM | POA: Diagnosis not present

## 2021-10-05 DIAGNOSIS — F17209 Nicotine dependence, unspecified, with unspecified nicotine-induced disorders: Secondary | ICD-10-CM | POA: Diagnosis not present

## 2021-10-05 DIAGNOSIS — R5383 Other fatigue: Secondary | ICD-10-CM | POA: Diagnosis not present

## 2021-10-05 DIAGNOSIS — I10 Essential (primary) hypertension: Secondary | ICD-10-CM | POA: Diagnosis not present

## 2021-10-05 DIAGNOSIS — R7303 Prediabetes: Secondary | ICD-10-CM | POA: Diagnosis not present

## 2021-10-05 DIAGNOSIS — G8929 Other chronic pain: Secondary | ICD-10-CM | POA: Diagnosis not present

## 2021-10-05 DIAGNOSIS — K5903 Drug induced constipation: Secondary | ICD-10-CM | POA: Diagnosis not present

## 2021-10-05 DIAGNOSIS — E785 Hyperlipidemia, unspecified: Secondary | ICD-10-CM | POA: Diagnosis not present

## 2021-10-06 DIAGNOSIS — H5213 Myopia, bilateral: Secondary | ICD-10-CM | POA: Diagnosis not present

## 2021-10-19 DIAGNOSIS — M5481 Occipital neuralgia: Secondary | ICD-10-CM | POA: Diagnosis not present

## 2021-10-19 DIAGNOSIS — M5030 Other cervical disc degeneration, unspecified cervical region: Secondary | ICD-10-CM | POA: Diagnosis not present

## 2021-10-19 DIAGNOSIS — M5136 Other intervertebral disc degeneration, lumbar region: Secondary | ICD-10-CM | POA: Diagnosis not present

## 2021-10-19 DIAGNOSIS — G894 Chronic pain syndrome: Secondary | ICD-10-CM | POA: Diagnosis not present

## 2021-10-25 DIAGNOSIS — N1831 Chronic kidney disease, stage 3a: Secondary | ICD-10-CM | POA: Diagnosis not present

## 2021-10-25 DIAGNOSIS — R809 Proteinuria, unspecified: Secondary | ICD-10-CM | POA: Diagnosis not present

## 2021-11-01 ENCOUNTER — Inpatient Hospital Stay: Payer: Medicare Other | Attending: Nurse Practitioner | Admitting: Nurse Practitioner

## 2021-11-01 ENCOUNTER — Encounter: Payer: Self-pay | Admitting: Nurse Practitioner

## 2021-11-01 VITALS — BP 126/82 | HR 102 | Temp 96.9°F | Ht 63.0 in | Wt 167.9 lb

## 2021-11-01 DIAGNOSIS — Z801 Family history of malignant neoplasm of trachea, bronchus and lung: Secondary | ICD-10-CM | POA: Diagnosis not present

## 2021-11-01 DIAGNOSIS — C50412 Malignant neoplasm of upper-outer quadrant of left female breast: Secondary | ICD-10-CM

## 2021-11-01 DIAGNOSIS — Z853 Personal history of malignant neoplasm of breast: Secondary | ICD-10-CM | POA: Diagnosis not present

## 2021-11-01 DIAGNOSIS — Z170 Estrogen receptor positive status [ER+]: Secondary | ICD-10-CM | POA: Diagnosis not present

## 2021-11-01 DIAGNOSIS — F17210 Nicotine dependence, cigarettes, uncomplicated: Secondary | ICD-10-CM | POA: Insufficient documentation

## 2021-11-01 DIAGNOSIS — Z923 Personal history of irradiation: Secondary | ICD-10-CM | POA: Diagnosis not present

## 2021-11-01 DIAGNOSIS — J449 Chronic obstructive pulmonary disease, unspecified: Secondary | ICD-10-CM | POA: Diagnosis not present

## 2021-11-01 DIAGNOSIS — Z08 Encounter for follow-up examination after completed treatment for malignant neoplasm: Secondary | ICD-10-CM

## 2021-11-01 DIAGNOSIS — Z79811 Long term (current) use of aromatase inhibitors: Secondary | ICD-10-CM | POA: Insufficient documentation

## 2021-11-01 DIAGNOSIS — Z800 Family history of malignant neoplasm of digestive organs: Secondary | ICD-10-CM | POA: Insufficient documentation

## 2021-11-01 DIAGNOSIS — Z803 Family history of malignant neoplasm of breast: Secondary | ICD-10-CM | POA: Diagnosis not present

## 2021-11-01 DIAGNOSIS — M8580 Other specified disorders of bone density and structure, unspecified site: Secondary | ICD-10-CM | POA: Diagnosis not present

## 2021-11-01 DIAGNOSIS — F419 Anxiety disorder, unspecified: Secondary | ICD-10-CM | POA: Insufficient documentation

## 2021-11-01 NOTE — Progress Notes (Signed)
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  INTERVAL HISTORY: Patient returns to clinic today for routine 63-monthevaluation.  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  She has no residual neurologic deficits.  She had a cardiac monitor implanted in her left chest which is palpable but otherwise denies new lumps or masses. She currently feels well and is asymptomatic.  She has no neurologic complaints.  She denies any recent fevers or illnesses.  She has a good appetite and denies weight loss.  She has no chest pain, cough, hemoptysis, or shortness of breath. She denies any nausea, vomiting, constipation, or diarrhea.  She has no urinary complaints.  Patient offers no further specific complaints today.  REVIEW OF SYSTEMS:   Review of Systems  Constitutional: Negative.  Negative for fever, malaise/fatigue and weight loss.  Respiratory: Negative.  Negative for cough, hemoptysis and shortness of breath.   Cardiovascular: Negative.  Negative for chest pain, palpitations and leg swelling.  Gastrointestinal: Negative.  Negative for abdominal pain.  Genitourinary: Negative.  Negative for dysuria.  Musculoskeletal: Negative.  Negative for joint pain.  Skin: Negative.  Negative for itching and rash.  Neurological: Negative.   Negative for dizziness, sensory change, focal weakness, loss of consciousness, weakness and headaches.  Psychiatric/Behavioral: Negative.  The patient is not nervous/anxious and does not have insomnia.   As per HPI. Otherwise, a complete review of systems is negative.  PAST MEDICAL HISTORY: Past Medical History:  Diagnosis Date   Abnormal Pap smear of cervix    01/2015 ascus/neg- 04/2015 ascus/neg   Allergy    Anxiety    Arthritis    Asthma    Breast cancer ([place]   Chronic kidney disease    COPD (chronic obstructive pulmonary disease) (HCC)    DDD (degenerative disc disease), lumbar    Depression    Elevated lipids    Fibromyalgia    Fibromyalgia    Genetic testing 03/09/2017   Multi-Cancer panel (83 genes) @ [facility] - No pathogenic mutations detected   GERD (gastroesophageal reflux disease)    History of IBS    Hyperthyroidism    Joint disease    Migraine    Migraine    Oxygen deficiency    Personal history of chemotherapy    Personal history of radiation therapy    Restless leg syndrome    Sleep apnea    Does not use C-PAP, cannot tolerate mask    PAST SURGICAL HISTORY: Past Surgical History:  Procedure Laterality Date   ABDOMINAL HYSTERECTOMY  2008   BREAST BIOPSY Left 02/02/2017   uKoreacore positive   BREAST LUMPECTOMY Left 02/17/2017   BREAST LUMPECTOMY WITH NEEDLE LOCALIZATION Left 02/17/2017   Procedure: BREAST LUMPECTOMY WITH NEEDLE LOCALIZATION;  Surgeon: DVickie Epley MD;  Location: [facility];  Service: General;  Laterality: Left;   CARPAL TUNNEL RELEASE Bilateral    cryotherapy     DILATION  AND CURETTAGE OF UTERUS     ENDOMETRIAL ABLATION     LAPAROSCOPIC OOPHERECTOMY Left    unsure which side but thinks its the left   LYSIS OF ADHESION Left 10/14/2015   Procedure: LYSIS OF ADHESION;  Surgeon: Thornton Park, MD;  Location: [facility];  Service: Orthopedics;  Laterality: Left;   NECK SURGERY     lower neck fusion rods and screws   OOPHORECTOMY      one ovary removed    PORTA CATH INSERTION N/A 03/08/2017   Procedure: PORTA CATH INSERTION;  Surgeon: Katha Cabal, MD;  Location: [facility];  Service: Cardiovascular;  Laterality: N/A;   RESECTION DISTAL CLAVICAL Left 10/14/2015   Procedure: RESECTION DISTAL CLAVICAL;  Surgeon: Thornton Park, MD;  Location: [facility];  Service: Orthopedics;  Laterality: Left;   SENTINEL NODE BIOPSY Left 02/17/2017   Procedure: SENTINEL NODE BIOPSY;  Surgeon: Vickie Epley, MD;  Location: [facility];  Service: General;  Laterality: Left;   SHOULDER ARTHROSCOPY WITH OPEN ROTATOR CUFF REPAIR Left 10/14/2015   Procedure: SHOULDER ARTHROSCOPY WITH OPEN ROTATOR CUFF REPAIR;  Surgeon: Thornton Park, MD;  Location: [facility];  Service: Orthopedics;  Laterality: Left;   SHOULDER ARTHROSCOPY WITH OPEN ROTATOR CUFF REPAIR AND DISTAL CLAVICLE ACROMINECTOMY Right 09/03/2014   Procedure: RIGHT SHOULDER ARTHROSCOPY WITH MINI OPEN ROTATOR CUFF TEAR;  Surgeon: Thornton Park, MD;  Location: [facility];  Service: Orthopedics;  Laterality: Right;  biceps tenodesis, arthroscopic subacromial decompression and distal clavicle incision   spinal injections     SUBACROMIAL DECOMPRESSION Left 10/14/2015   Procedure: SUBACROMIAL DECOMPRESSION;  Surgeon: Thornton Park, MD;  Location: [facility];  Service: Orthopedics;  Laterality: Left;   TEE WITHOUT CARDIOVERSION N/A 02/22/2021   Procedure: TRANSESOPHAGEAL ECHOCARDIOGRAM (TEE);  Surgeon: [name], MD;  Location: [facility];  Service: Cardiovascular;  Laterality: N/A;    FAMILY HISTORY: Family History  Problem Relation Age of Onset   Breast cancer Mother 57       Glioblastoma also; deceased at 34   Diabetes Father    Lung cancer Father        mesothelioma; deceased 53s   Cancer Maternal Aunt        "bone ca"; unk. primary   Colon cancer Maternal Grandfather        dx in 74s; deceased 46   Colon cancer Maternal Aunt        dx in 59s; currently 18s   Lung  cancer Maternal Grandmother        smoker; deceased 42   Lung cancer Paternal Grandmother        smoker; deceased 66s   Breast cancer Cousin        dx 36s; daughter of mat aunt with unk. primary cancer   Cancer Other        distant cousin; unknown primary   Colon cancer Other        dx 43s; currently 73; maternal half-sister   Bipolar disorder Sister    Schizophrenia Sister     ADVANCED DIRECTIVES (Y/N):  N  HEALTH MAINTENANCE: Social History   Tobacco Use   Smoking status: Every Day    Packs/day: 1.00    Types: Cigarettes   Smokeless tobacco: Never  Vaping Use   Vaping Use: Never used  Substance Use Topics   Alcohol use: No    Alcohol/week: 0.0 standard drinks of alcohol   Drug use: No     Colonoscopy:  PAP:  Bone density:  Lipid panel:  Allergies  Allergen Reactions   Iodine    Bee Venom Hives and Rash   Cefoxitin Rash   Cefuroxime Axetil Hives and Rash   Cucumber Extract Rash   Gadolinium Derivatives Swelling, Other (See Comments) and Rash    NECK BECAME RED AND TONGUE WAS SWELLING SLIGHTLY PER PT NECK BECAME RED AND TONGUE WAS SWELLING SLIGHTLY PER PT NECK BECAME RED AND TONGUE WAS SWELLING SLIGHTLY PER PT   Iodinated Contrast Media Hives and Rash   Latex Rash   Other Hives and Rash    Bee Stings-swelling/hives/rash Wool (textile fiber)-Rash/itching   Tomato Rash    Red tomatoes    Current Outpatient Medications  Medication Sig Dispense Refill   aspirin EC 81 MG EC tablet Take 1 tablet (81 mg total) by mouth daily. Swallow whole. 30 tablet 11   baclofen (LIORESAL) 10 MG tablet Take 10 mg by mouth 3 (three) times daily as needed for muscle spasms (typically 2-3 times daily).      calcium carbonate (OS-CAL) 600 MG TABS tablet Take by mouth.     cholecalciferol (VITAMIN D) 1000 UNITS tablet Take 1,000 Units by mouth 2 (two) times daily.      clindamycin-benzoyl peroxide (BENZACLIN) gel SMARTSIG:Sparingly Topical Twice Daily     diclofenac Sodium  (VOLTAREN) 1 % GEL SMARTSIG:2-4 Gram(s) Topical 4 Times Daily     EPINEPHrine (EPIPEN JR) 0.15 MG/0.3ML injection Inject 0.15 mg into the muscle as needed for anaphylaxis. (Patient not taking: Reported on 04/29/2021)     Multiple Vitamins-Minerals (HAIR SKIN AND NAILS FORMULA) TABS Take 2 tablets by mouth daily.     NARCAN 4 MG/0.1ML LIQD nasal spray kit CALL 911. ADMINISTER A SINGLE SPRAY OF NARCAN IN ONE NOSTRIL. REPEAT EVERY 3 MINUTES AS NEEDED IF NO OR MINIMAL RESPONSE. (Patient not taking: Reported on 04/29/2021)     OXYCODONE HCL PO Take 15 mg by mouth as needed. Take 4-6 times a day as needed.     pregabalin (LYRICA) 25 MG capsule Take 1 capsule by mouth 2 (two) times daily.     promethazine (PHENERGAN) 25 MG tablet Take 25 mg by mouth every 6 (six) hours as needed for nausea or vomiting. (Patient not taking: Reported on 04/29/2021)     rizatriptan (MAXALT-MLT) 10 MG disintegrating tablet Take 10 mg by mouth as needed for migraine. May repeat in 2 hours if needed (Patient not taking: Reported on 04/29/2021)     rOPINIRole (REQUIP) 1 MG tablet Take 1.5 mg by mouth at bedtime.     rosuvastatin (CRESTOR) 40 MG tablet Take 40 mg by mouth at bedtime.      topiramate (TOPAMAX) 100 MG tablet Take 200 mg by mouth daily.      traZODone (DESYREL) 50 MG tablet Take 50 mg by mouth at bedtime.     vitamin E 200 UNIT capsule Take 200 Units by mouth daily.     No current facility-administered medications for this visit.   Facility-Administered Medications Ordered in Other Visits  Medication Dose Route Frequency Provider Last Rate Last Admin   heparin lock flush 100 unit/mL  500 Units Intravenous Once Lloyd Huger, MD       sodium chloride flush (NS) 0.9 % injection 10 mL  10 mL Intravenous PRN Lloyd Huger, MD   10 mL at 08/21/17 1057    OBJECTIVE: There were no vitals filed for this visit.    There is no height or weight on file to calculate BMI.  ECOG FS:0 - Asymptomatic  General:  Well-developed, well-nourished, no acute distress. Eyes: Pink conjunctiva, anicteric sclera. HEENT: Normocephalic, moist mucous membranes. Breast: Breast exam was performed in seated and lying down position. Patient is status post left lumpectomy with a well-healed surgical scar. Cardiac monitor is palpable in left breast with well healed surgical scar. No evidence of axillary adenopathy. No evidence of any palpable masses or lumps in the right breast. No evidence of right axillary adenopathy Lungs: No audible wheezing or coughing. Heart: Regular rate and rhythm. Abdomen: Soft, nontender, no obvious distention. Musculoskeletal: No edema, cyanosis, or clubbing. Neuro: Alert, answering all questions appropriately. Cranial nerves grossly intact. Skin: No rashes or petechiae noted. Psych: Normal affect.   LAB RESULTS:  Lab Results  Component Value Date   NA 137 02/20/2021   K 3.8 02/20/2021   CL 106 02/20/2021   CO2 23 02/20/2021   GLUCOSE 98 02/20/2021   BUN 14 02/20/2021   CREATININE 1.05 (H) 02/20/2021   CALCIUM 9.2 02/20/2021   PROT 7.4 02/20/2021   ALBUMIN 3.8 02/20/2021   AST 14 (L) 02/20/2021   ALT 11 02/20/2021   ALKPHOS 84 02/20/2021   BILITOT 0.3 02/20/2021   GFRNONAA >60 02/20/2021   GFRAA >60 09/11/2017    Lab Results  Component Value Date   WBC 9.6 02/20/2021   NEUTROABS 6.7 02/20/2021   HGB 13.7 02/20/2021   HCT 42.3 02/20/2021   MCV 99.5 02/20/2021   PLT 196 02/20/2021     STUDIES: No results found.   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  PLAN:    1. 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  Patient initiated adjuvant chemotherapy with Taxol and Herceptin on March 13, 2017.  She completed Taxol on May 29, 2017.  Patient  then received 4 additional cycles of Herceptin maintenance treatment, but this was permanently discontinued on August 21, 2017 secondary to persistently low EF on MUGA scan.  Letrozole was discontinued secondary to hot flashes and patient was transitioned to anastrozole.  Although anastrozole was not the etiology of her CVA, patient is hesitant to reinitiate treatment.  She expressed understanding that her risk of recurrence increases without taking an aromatase inhibitor for total of 5 years.  No intervention is needed at this time.  Patient most recent mammogram on April 26, 2021 was reported as BI-RADS 1.  Repeat in March 2024. Transition to annual visits once patient is 5 years from completing treatment, estimate in July 2024.  2.  Genetic testing: Negative.  No further interventions are needed. 3.  Joint pain: Patient does not complain of this today. 4.  Anxiety: Continue evaluation and treatment per psychiatry. 5.  Chronic pain: Patient does not complain of this today.  Continue appointments with pain clinic as scheduled. 6.  Cardiac disease: Herceptin was previously discontinued permanently.  Continue follow-up and evaluation per cardiology.  Patient's most recent cardiac echo on February 22, 2021 revealed an ejection fraction of 60 to 65%.  Also reported was a positive bubble study consistent with a possible small PFO suggestive of intra-atrial shunt.   7.  Osteopenia: Bone mineral density on September 26, 2020 was unchanged from previous with a T score approximately -2.0.  Repeat bone density in August 2023 was improved with t score -1.8. Continue calcium and vitamin D supplementation.  Repeat in August 2025.  March- mammogram 6 mo- see me for breast cancer surveillance- la  Patient expressed understanding and was in agreement with this plan. She also understands that She can call clinic at any time with any questions, concerns, or complaints.    Cancer Staging  Malignant neoplasm of upper-outer  quadrant of left breast in female, estrogen receptor positive ([place]) Staging form: Breast, AJCC 8th Edition - Clinical stage from 02/07/2017: Stage IA (cT1c, cN0, cM0, G1, ER+, PR+, HER2+) - Signed by Lloyd Huger, MD on 02/11/2017 Neoadjuvant therapy: No Histologic grading system: 3 grade system Laterality: Left   Verlon Au, NP   11/01/2021

## 2021-11-16 DIAGNOSIS — M5134 Other intervertebral disc degeneration, thoracic region: Secondary | ICD-10-CM | POA: Diagnosis not present

## 2021-11-16 DIAGNOSIS — M5136 Other intervertebral disc degeneration, lumbar region: Secondary | ICD-10-CM | POA: Diagnosis not present

## 2021-11-16 DIAGNOSIS — G894 Chronic pain syndrome: Secondary | ICD-10-CM | POA: Diagnosis not present

## 2021-11-16 DIAGNOSIS — M259 Joint disorder, unspecified: Secondary | ICD-10-CM | POA: Diagnosis not present

## 2021-11-16 DIAGNOSIS — M5481 Occipital neuralgia: Secondary | ICD-10-CM | POA: Diagnosis not present

## 2021-11-16 DIAGNOSIS — M47817 Spondylosis without myelopathy or radiculopathy, lumbosacral region: Secondary | ICD-10-CM | POA: Diagnosis not present

## 2021-11-16 DIAGNOSIS — M5030 Other cervical disc degeneration, unspecified cervical region: Secondary | ICD-10-CM | POA: Diagnosis not present

## 2021-11-18 DIAGNOSIS — H52223 Regular astigmatism, bilateral: Secondary | ICD-10-CM | POA: Diagnosis not present

## 2021-11-30 DIAGNOSIS — M5136 Other intervertebral disc degeneration, lumbar region: Secondary | ICD-10-CM | POA: Diagnosis not present

## 2021-11-30 DIAGNOSIS — I10 Essential (primary) hypertension: Secondary | ICD-10-CM | POA: Diagnosis not present

## 2021-11-30 DIAGNOSIS — E785 Hyperlipidemia, unspecified: Secondary | ICD-10-CM | POA: Diagnosis not present

## 2021-12-02 DIAGNOSIS — J449 Chronic obstructive pulmonary disease, unspecified: Secondary | ICD-10-CM | POA: Diagnosis not present

## 2022-01-01 DIAGNOSIS — J449 Chronic obstructive pulmonary disease, unspecified: Secondary | ICD-10-CM | POA: Diagnosis not present

## 2022-01-11 DIAGNOSIS — I63119 Cerebral infarction due to embolism of unspecified vertebral artery: Secondary | ICD-10-CM | POA: Diagnosis not present

## 2022-01-11 DIAGNOSIS — I10 Essential (primary) hypertension: Secondary | ICD-10-CM | POA: Diagnosis not present

## 2022-01-11 DIAGNOSIS — I5020 Unspecified systolic (congestive) heart failure: Secondary | ICD-10-CM | POA: Diagnosis not present

## 2022-01-11 DIAGNOSIS — G4733 Obstructive sleep apnea (adult) (pediatric): Secondary | ICD-10-CM | POA: Diagnosis not present

## 2022-01-11 DIAGNOSIS — G4730 Sleep apnea, unspecified: Secondary | ICD-10-CM | POA: Diagnosis not present

## 2022-01-11 DIAGNOSIS — E782 Mixed hyperlipidemia: Secondary | ICD-10-CM | POA: Diagnosis not present

## 2022-01-11 DIAGNOSIS — G2581 Restless legs syndrome: Secondary | ICD-10-CM | POA: Diagnosis not present

## 2022-01-11 DIAGNOSIS — H539 Unspecified visual disturbance: Secondary | ICD-10-CM | POA: Diagnosis not present

## 2022-01-11 DIAGNOSIS — G43119 Migraine with aura, intractable, without status migrainosus: Secondary | ICD-10-CM | POA: Diagnosis not present

## 2022-01-11 DIAGNOSIS — Z720 Tobacco use: Secondary | ICD-10-CM | POA: Diagnosis not present

## 2022-01-11 DIAGNOSIS — N182 Chronic kidney disease, stage 2 (mild): Secondary | ICD-10-CM | POA: Diagnosis not present

## 2022-01-11 DIAGNOSIS — I6340 Cerebral infarction due to embolism of unspecified cerebral artery: Secondary | ICD-10-CM | POA: Diagnosis not present

## 2022-01-29 DIAGNOSIS — E785 Hyperlipidemia, unspecified: Secondary | ICD-10-CM | POA: Diagnosis not present

## 2022-01-29 DIAGNOSIS — I10 Essential (primary) hypertension: Secondary | ICD-10-CM | POA: Diagnosis not present

## 2022-02-01 DIAGNOSIS — J449 Chronic obstructive pulmonary disease, unspecified: Secondary | ICD-10-CM | POA: Diagnosis not present

## 2022-02-08 DIAGNOSIS — M19019 Primary osteoarthritis, unspecified shoulder: Secondary | ICD-10-CM | POA: Diagnosis not present

## 2022-02-08 DIAGNOSIS — C50919 Malignant neoplasm of unspecified site of unspecified female breast: Secondary | ICD-10-CM | POA: Diagnosis not present

## 2022-02-08 DIAGNOSIS — G894 Chronic pain syndrome: Secondary | ICD-10-CM | POA: Diagnosis not present

## 2022-03-04 DIAGNOSIS — J449 Chronic obstructive pulmonary disease, unspecified: Secondary | ICD-10-CM | POA: Diagnosis not present

## 2022-03-08 DIAGNOSIS — M5134 Other intervertebral disc degeneration, thoracic region: Secondary | ICD-10-CM | POA: Diagnosis not present

## 2022-03-08 DIAGNOSIS — G894 Chronic pain syndrome: Secondary | ICD-10-CM | POA: Diagnosis not present

## 2022-03-08 DIAGNOSIS — C50919 Malignant neoplasm of unspecified site of unspecified female breast: Secondary | ICD-10-CM | POA: Diagnosis not present

## 2022-03-21 ENCOUNTER — Ambulatory Visit: Payer: 59

## 2022-03-22 NOTE — Progress Notes (Addendum)
Follow Up Pharmacist Visit (CCM)  Clinical Summary  Next CCM Follow Up: 04/04/22 between 12 and 1 PM  Next AWV: Yet to be scheduled  Summary for PCP:  - Patient has trouble falling asleep. She was on Dayvigo and is now switched to Trazodone 65m daily. However, she reports no resolution of insomnia Recommended increasing to 2 tablets nightly and will reassess in 2 weeks. - Patient is also re starting smoking cessation. She currently smokes 1/2 PPD and plans on cutting down to 5 cigarettes a day in 2 weeks. - CP F/U (smoking and sleep) in 2 weeks  Disease Assessments  Subjective Information Visit Completed on: 03/21/2022 Subjective: - Patient wants to convert visit on 04/05/22 to wellness visit She lives by herself, her best friend checks on her. She has 1 brother who is not very close. He is her emergency contact, but they are not close. She has a permanent heart monitor, had 5 strokes. She is very independent. She owns her own home, has 1 stepdaughter - who she texts every morning. She cooks, cleans, takes dog out every 2-3 hours. She walks her circle driveway - [id number] each time. She does crafts - bead work (stroke effected eyes bad), lBrewing technologistwork and pScientist, research (life sciences) [place] Patient has a hard time with her crafts. She has her own car and can drive during the daytime. She cannot see at night. But for the most part she stays home. Lifestyle habits: Diet : she eats what she wants. She does not eat BF- makes her sick. Drinks coffee in the morning and then sits around till she gets hungry. Around 1pm - she makes eggs, bacon, toast, cereal or pop tart. She doesn't like to cook so she fixes up some food. Then she eats around 7PM - shrimp, corn. She may eat some around 3PM some days and doesn't eat much. She takes Lyrica for pain, which made her gain weight. She takes 1 Lyrica a day. She drinks tea and soda. Sometimes cheese. Water is 3-4 bottles a  day.  Exercise: walking the dog few times a day  Tobacco: 1/2 PPD - she quit one time, then COVID came and made her nervous and she picked it back up. She is trying to quit right now. Been on 1/2 PPD for a month. Also bought NicoDerm patch.  Rec.Drugs : None . Takes Oxycodone for pain from pain management - for Fibromyalgia.  What is the patient's sleep pattern?: Other (with free form text) Details: Dayviga was removed, and trazodone was added, which doesn't work. Goes to bed at 10am and wakes at 9AM. Sometimes she sleeps for 3 hrs and then toss and turns for the rest of the night.   SDOH: Accountable Health Communities Health-Related Social Needs Screening Tool ([email or web address] SDOH questions were documented and reviewed (EMR or [employer]) within the past 12 months or since hospitalization?: No What is your living situation today? (ref #1): I have a steady place to live Think about the place you live. Do you have problems with any of the following? (ref #2): None of the above Within the past 12 months, you worried that your food would run out before you got money to buy more (ref #3): Never true Within the past 12 months, the food you bought just didn't last and you didn't have money to get more (ref #4): Never true In the past 12 months, has lack of reliable transportation kept you from medical  appointments, meetings, work or from getting things needed for daily living? (ref #5): No In the past 12 months, has the electric, gas, oil, or water company threatened to shut off services in your home? (ref #6): No How often does anyone, including family and friends, physically hurt you? (ref #7): Never (1) How often does anyone, including family and friends, insult or talk down to you? (ref #8): Never (1) How often does anyone, including friends and family, threaten you with harm? (ref #9): Never (1) How often does anyone, including family  and friends, scream or curse at you? (ref #10): Never (1)  Medication Adherence Does the [facility] have access to medication refill history?: Yes Medication adherence rates for the STAR metric medications: Rosuvastatin 40 mg -  09/01/21 90 DS (Refill 12/2021) Received a refill early 11/24/21 90 DS Medication adherence rates for non-STAR metric medications: None. Is Patient using [facility] pharmacy?: No [facility] and location of [facility] Current Rx insurance plan: [facility] Additional Info: Patient uses a pill box Assessment:: Adherent  Hyperlipidemia/Dyslipidemia (HLD) Last Lipid panel on: 10/01/2021 TC (Goal<200): 133 LDL: 64 HDL (Goal>40): 42 TG (Goal<150): 159 ASCVD 10-year risk?is:: Low (<5%) ASCVD Risk Score: 3 % Assessed today?: No Drug: rosuvastatin 40 mg  Pharmacist Assessment: Appropriate, Effective, Safe, Accessible Diabetes (DM) Most recent A1C: 6.0 taken on: 10/01/2021 Most Recent GFR: 61 taken on: 10/01/2021 Type: Pre-Diabetes/Impaired Fasting Glucose Assessed today?: No  Allergies Assessed today?: Yes Allergy triggers: Pet dander, Cigarette Smoke Additional Info: has a cat and dog. Cat sleeps with her on the bed Assessment:: Uncontrolled Drug: allegra 152m daily  Pharmacist Assessment: Appropriate, Query Effectiveness  Insomnia Assessed today?: Yes Patient has following issues with sleeping: Trouble staying asleep, Trouble falling back to sleep after waking What time do you typically go to bed?: 10PM How long does it typically take you to fall asleep?: 372ms , sleeps with a pillow between legs. What time do you typically wake up?: 9AM Do you nap during the day?: No We discussed: Ensuring room is cool and dark when trying to sleep, Avoiding screens (TV, phone, tablets) prior to bedtime, Establishing a nighttime routine including a consistent bedtime Assessment:: Uncontrolled Drug: trazodone 506maily Pharmacist Assessment: Appropriate, Query  Effectiveness Plan/Follow up: Modify : trazodone 49m46mcrease to 2 tablets nightly. F/U in 2 weeks to assess if 100mg46mworking CounsEnglish as a second language teachertch for grogginess the next morning.  Tobacco Use Disorder Assessed today?: Yes Has there been change in patients smoking/vaping habit since last visit with pharmacist?: No Is patient currently Smoking or Vaping?: Yes How soon after you wake up do you smoke your first cigarette?: Within 5 minutes (3 points) Do you find it difficult to refrain from smoking in places where it is forbidden (church, libraArt therapistemArchivisttaurants)?: No (0 points) Which cigarette would you hate most to give up?: After a hot drink (0 points) How many cigarettes per day do you smoke?: 10 or less (0 points) Do you smoke more during the morning hours than during the rest of the day?: Yes (1 point) Faegerstrom score:: 4 What health problems has your smoking contributed to?: She realizes her heart condition will get worse with smoking Is patient ready to quit smoking?: Yes When you have tried to quit in the past, what things (people, programs, behavior changes) have helped?: once before COVID Additional Info: In the next 2 week - plan is to cut down to 5 a day and will be applying the NRT.  First  morning cigarette is the hardest to give up, within 51mns and with coffee. Assessment:: Uncontrolled Drug: Nicoderm Patch 234mdaily Pharmacist Assessment: Appropriate, Query Effectiveness Plan/Follow up: Check in in 2 weeks about cigarette smoking - 5 a day max.  Preventative Health Care Gap: Colorectal cancer screening: Addressed Care Gap: Breast cancer screening: Needs to be addressed Care Gap: Annual Wellness Visit (AWV): Needs to be addressed Immunizations needed: Other, Tdap or Td, Zoster Details: Hep C  Plan/Follow up: Patient says colonoscopy was done - doesn't remember when DEXA due after mammogram.  Medication List  baclofen (LIORESAL) 10 MG tablet BID  calcium  carbonate (OS-CAL) 600 MG TABS t  cholecalciferol (VITAMIN D) 1000 UNITS tablet   benzoyl peroxide (BENZACLIN) gel   Diclofenac Sodium (VOLTAREN) 1 % GEL   EPINEPHrine (EPIPEN JR) 0.15 MG/0.3ML injection  Multiple Vitamins-Minerals (HAIR SKIN AND NAILS FORMULA) TAB  NARCAN 4 MG/0.1ML LIQD nasal spray kit  OXYCODONE HCL 158m46h PPA   pregabalin (LYRICA) 25 MG capsule daily   promethazine (PHENERGAN) 25 MG tablet   rizatriptan (MAXALT-MLT) 10 MG disintegrating tablet   rOPINIRole (REQUIP) 1 MG tablet daily  rosuvastatin (CRESTOR) 40 MG tablet daily  topiramate (TOPAMAX) 100 MG tablet twice daily  traZODone (DESYREL) 50 MG tablet at bedtime.  vitamin E 200 UNIT capsule [name], Briana on 03/17/2022 11:35 AM HC Chart Review: 15 min 03/17/22 HC Assessment call time spent: 15 min 03/17/22   Pharmacy Interventions Intervention Details Pharmacist Interventions discussed: Yes Modified Therapy: Ineffective dose Education: Smoking cessation  SusJerral RalphharmD  Chart review : 80m75m58secs Televisit :52mi3m8secs  Documentation :

## 2022-03-24 DIAGNOSIS — H2513 Age-related nuclear cataract, bilateral: Secondary | ICD-10-CM | POA: Diagnosis not present

## 2022-03-24 DIAGNOSIS — H53482 Generalized contraction of visual field, left eye: Secondary | ICD-10-CM | POA: Diagnosis not present

## 2022-03-31 ENCOUNTER — Other Ambulatory Visit: Payer: 59

## 2022-03-31 ENCOUNTER — Other Ambulatory Visit: Payer: Self-pay | Admitting: Nurse Practitioner

## 2022-03-31 DIAGNOSIS — R7303 Prediabetes: Secondary | ICD-10-CM | POA: Diagnosis not present

## 2022-03-31 DIAGNOSIS — R5383 Other fatigue: Secondary | ICD-10-CM | POA: Diagnosis not present

## 2022-03-31 DIAGNOSIS — I10 Essential (primary) hypertension: Secondary | ICD-10-CM | POA: Diagnosis not present

## 2022-03-31 DIAGNOSIS — E785 Hyperlipidemia, unspecified: Secondary | ICD-10-CM | POA: Diagnosis not present

## 2022-03-31 DIAGNOSIS — M5136 Other intervertebral disc degeneration, lumbar region: Secondary | ICD-10-CM | POA: Diagnosis not present

## 2022-04-01 LAB — COMPREHENSIVE METABOLIC PANEL
ALT: 9 IU/L (ref 0–32)
AST: 13 IU/L (ref 0–40)
Albumin/Globulin Ratio: 1.6 (ref 1.2–2.2)
Albumin: 4.2 g/dL (ref 3.8–4.9)
Alkaline Phosphatase: 87 IU/L (ref 44–121)
BUN/Creatinine Ratio: 12 (ref 9–23)
BUN: 16 mg/dL (ref 6–24)
Bilirubin Total: <0.2 mg/dL (ref 0.0–1.2)
CO2: 19 mmol/L — ABNORMAL LOW (ref 20–29)
Calcium: 9.5 mg/dL (ref 8.7–10.2)
Chloride: 109 mmol/L — ABNORMAL HIGH (ref 96–106)
Creatinine, Ser: 1.29 mg/dL — ABNORMAL HIGH (ref 0.57–1.00)
Globulin, Total: 2.6 g/dL (ref 1.5–4.5)
Glucose: 109 mg/dL — ABNORMAL HIGH (ref 70–99)
Potassium: 4 mmol/L (ref 3.5–5.2)
Sodium: 143 mmol/L (ref 134–144)
Total Protein: 6.8 g/dL (ref 6.0–8.5)
eGFR: 48 mL/min/{1.73_m2} — ABNORMAL LOW (ref >59)

## 2022-04-01 LAB — CBC WITH DIFFERENTIAL/PLATELET
Basophils Absolute: 0.1 10*3/uL (ref 0.0–0.2)
Basos: 1 %
EOS (ABSOLUTE): 0.2 10*3/uL (ref 0.0–0.4)
Eos: 2 %
Hematocrit: 42.8 % (ref 34.0–46.6)
Hemoglobin: 14.4 g/dL (ref 11.1–15.9)
Immature Grans (Abs): 0 10*3/uL (ref 0.0–0.1)
Immature Granulocytes: 1 %
Lymphocytes Absolute: 1.5 10*3/uL (ref 0.7–3.1)
Lymphs: 18 %
MCH: 32 pg (ref 26.6–33.0)
MCHC: 33.6 g/dL (ref 31.5–35.7)
MCV: 95 fL (ref 79–97)
Monocytes Absolute: 0.4 10*3/uL (ref 0.1–0.9)
Monocytes: 5 %
Neutrophils Absolute: 6.2 10*3/uL (ref 1.4–7.0)
Neutrophils: 73 %
Platelets: 243 10*3/uL (ref 150–450)
RBC: 4.5 x10E6/uL (ref 3.77–5.28)
RDW: 13.2 % (ref 11.7–15.4)
WBC: 8.4 10*3/uL (ref 3.4–10.8)

## 2022-04-01 LAB — LIPID PANEL W/O CHOL/HDL RATIO
Cholesterol, Total: 119 mg/dL (ref 100–199)
HDL: 40 mg/dL (ref >39)
LDL Chol Calc (NIH): 43 mg/dL (ref 0–99)
Triglycerides: 232 mg/dL — ABNORMAL HIGH (ref 0–149)
VLDL Cholesterol Cal: 36 mg/dL (ref 5–40)

## 2022-04-01 LAB — HGB A1C W/O EAG: Hgb A1c MFr Bld: 5.8 % — ABNORMAL HIGH (ref 4.8–5.6)

## 2022-04-01 LAB — TSH: TSH: 1.88 u[IU]/mL (ref 0.450–4.500)

## 2022-04-02 DIAGNOSIS — J449 Chronic obstructive pulmonary disease, unspecified: Secondary | ICD-10-CM | POA: Diagnosis not present

## 2022-04-04 ENCOUNTER — Telehealth: Payer: Self-pay | Admitting: Obstetrics and Gynecology

## 2022-04-04 NOTE — Telephone Encounter (Signed)
Left message for patient to call office back to r/s appt for 05/04/22

## 2022-04-05 ENCOUNTER — Ambulatory Visit: Payer: 59 | Admitting: Nurse Practitioner

## 2022-04-06 NOTE — Telephone Encounter (Signed)
The patient was rescheduled for 5/28 with Dr. Marcelline Mates, by AM on 3/5.

## 2022-04-11 ENCOUNTER — Ambulatory Visit (INDEPENDENT_AMBULATORY_CARE_PROVIDER_SITE_OTHER): Payer: 59 | Admitting: Nurse Practitioner

## 2022-04-11 VITALS — BP 126/72 | HR 110 | Ht 63.0 in | Wt 164.0 lb

## 2022-04-11 DIAGNOSIS — B029 Zoster without complications: Secondary | ICD-10-CM

## 2022-04-11 DIAGNOSIS — E782 Mixed hyperlipidemia: Secondary | ICD-10-CM | POA: Diagnosis not present

## 2022-04-11 DIAGNOSIS — L709 Acne, unspecified: Secondary | ICD-10-CM | POA: Diagnosis not present

## 2022-04-11 DIAGNOSIS — G4700 Insomnia, unspecified: Secondary | ICD-10-CM | POA: Diagnosis not present

## 2022-04-11 MED ORDER — VALACYCLOVIR HCL 1 G PO TABS: 1000.0000 mg | ORAL_TABLET | Freq: Two times a day (BID) | ORAL | 3 refills | Status: DC

## 2022-04-11 MED ORDER — BENZOYL PEROXIDE-ERYTHROMYCIN 5-3 % EX GEL: CUTANEOUS | 0 refills | Status: DC

## 2022-04-11 MED ORDER — TRAZODONE HCL 100 MG PO TABS: 100.0000 mg | ORAL_TABLET | Freq: Every day | ORAL | 1 refills | Status: DC

## 2022-04-11 NOTE — Patient Instructions (Signed)
1) Shingles outbreak 2) Acne treatment 3) Drink more water, fat free foods, prediabetic diet 4) Annual Wellness Visit for Medicare in June

## 2022-04-11 NOTE — Progress Notes (Signed)
Established Patient Office Visit  Subjective:  Patient ID: Briana [name], female    DOB: 07/16/1965  Age: 57 y.o. MRN: [id number]  Chief Complaint  Patient presents with   Follow-up    6 Months Follow up    Lab results review and A1c is at 5.8%, LDL is at goal, trigs are too high, eGFR is at 48.  Shingles on right buttock.  Pt has acne like skin lesions around mouth.     Past Medical History:  Diagnosis Date   Abnormal Pap smear of cervix    01/2015 ascus/neg- 04/2015 ascus/neg   Allergy    Anxiety    Arthritis    Asthma    Breast cancer ([place]) 2019   Chronic kidney disease    COPD (chronic obstructive pulmonary disease) (HCC)    DDD (degenerative disc disease), lumbar    Depression    Elevated lipids    Fibromyalgia    Fibromyalgia    Genetic testing 03/09/2017   Multi-Cancer panel (83 genes) @ [facility] - No pathogenic mutations detected   GERD (gastroesophageal reflux disease)    History of IBS    Hyperthyroidism    Joint disease    Migraine    Migraine    Oxygen deficiency    Personal history of chemotherapy    Personal history of radiation therapy    Restless leg syndrome    Sleep apnea    Does not use C-PAP, cannot tolerate mask    Past Surgical History:  Procedure Laterality Date   ABDOMINAL HYSTERECTOMY  2008   BREAST BIOPSY Left 02/02/2017   [place] core positive   BREAST LUMPECTOMY Left 02/17/2017   BREAST LUMPECTOMY WITH NEEDLE LOCALIZATION Left 02/17/2017   Procedure: BREAST LUMPECTOMY WITH NEEDLE LOCALIZATION;  Surgeon: Vickie Epley, MD;  Location: [facility];  Service: General;  Laterality: Left;   CARPAL TUNNEL RELEASE Bilateral    cryotherapy     DILATION AND CURETTAGE OF UTERUS     ENDOMETRIAL ABLATION     LAPAROSCOPIC OOPHERECTOMY Left    unsure which side but thinks its the left   LYSIS OF ADHESION Left 10/14/2015   Procedure: LYSIS OF ADHESION;  Surgeon: Thornton Park, MD;  Location: [facility];  Service: Orthopedics;  Laterality: Left;    NECK SURGERY     lower neck fusion rods and screws   OOPHORECTOMY     one ovary removed    PORTA CATH INSERTION N/A 03/08/2017   Procedure: PORTA CATH INSERTION;  Surgeon: Katha Cabal, MD;  Location: [facility];  Service: Cardiovascular;  Laterality: N/A;   RESECTION DISTAL CLAVICAL Left 10/14/2015   Procedure: RESECTION DISTAL CLAVICAL;  Surgeon: Thornton Park, MD;  Location: [facility];  Service: Orthopedics;  Laterality: Left;   SENTINEL NODE BIOPSY Left 02/17/2017   Procedure: SENTINEL NODE BIOPSY;  Surgeon: Vickie Epley, MD;  Location: [facility];  Service: General;  Laterality: Left;   SHOULDER ARTHROSCOPY WITH OPEN ROTATOR CUFF REPAIR Left 10/14/2015   Procedure: SHOULDER ARTHROSCOPY WITH OPEN ROTATOR CUFF REPAIR;  Surgeon: Thornton Park, MD;  Location: [facility];  Service: Orthopedics;  Laterality: Left;   SHOULDER ARTHROSCOPY WITH OPEN ROTATOR CUFF REPAIR AND DISTAL CLAVICLE ACROMINECTOMY Right 09/03/2014   Procedure: RIGHT SHOULDER ARTHROSCOPY WITH MINI OPEN ROTATOR CUFF TEAR;  Surgeon: Thornton Park, MD;  Location: [facility];  Service: Orthopedics;  Laterality: Right;  biceps tenodesis, arthroscopic subacromial decompression and distal clavicle incision   spinal injections  SUBACROMIAL DECOMPRESSION Left 10/14/2015   Procedure: SUBACROMIAL DECOMPRESSION;  Surgeon: Thornton Park, MD;  Location: [facility];  Service: Orthopedics;  Laterality: Left;   TEE WITHOUT CARDIOVERSION N/A 02/22/2021   Procedure: TRANSESOPHAGEAL ECHOCARDIOGRAM (TEE);  Surgeon: [name], MD;  Location: [facility];  Service: Cardiovascular;  Laterality: N/A;    Social History   Socioeconomic History   Marital status: Divorced    Spouse name: Not on file   Number of children: 1   Years of education: Not on file   Highest education level: High school graduate  Occupational History    Comment: disabled  Tobacco Use   Smoking status: Every Day    Packs/day: 1.00    Types: Cigarettes    Smokeless tobacco: Never  Vaping Use   Vaping Use: Never used  Substance and Sexual Activity   Alcohol use: No    Alcohol/week: 0.0 standard drinks of alcohol   Drug use: No   Sexual activity: Yes  Other Topics Concern   Not on file  Social History Narrative   Not on file   Social Determinants of Health   Financial Resource Strain: High Risk (04/26/2017)   Overall Financial Resource Strain (CARDIA)    Difficulty of Paying Living Expenses: Very hard  Food Insecurity: Food Insecurity Present (04/26/2017)   Hunger Vital Sign    Worried About Running Out of Food in the Last Year: Often true    Ran Out of Food in the Last Year: Often true  Transportation Needs: No Transportation Needs (04/26/2017)   PRAPARE - Hydrologist (Medical): No    Lack of Transportation (Non-Medical): No  Physical Activity: Inactive (04/26/2017)   Exercise Vital Sign    Days of Exercise per Week: 0 days    Minutes of Exercise per Session: 0 min  Stress: Stress Concern Present (04/26/2017)   [place]    Feeling of Stress : Very much  Social Connections: Unknown (04/26/2017)   Social Connection and Isolation Panel [NHANES]    Frequency of Communication with Friends and Family: Not on file    Frequency of Social Gatherings with Friends and Family: Not on file    Attends Religious Services: Never    Active Member of Clubs or Organizations: No    Attends Archivist Meetings: Never    Marital Status: Divorced  Human resources officer Violence: Not At Risk (04/26/2017)   Humiliation, Afraid, Rape, and Kick questionnaire    Fear of Current or Ex-Partner: No    Emotionally Abused: No    Physically Abused: No    Sexually Abused: No    Family History  Problem Relation Age of Onset   Breast cancer Mother 40       Glioblastoma also; deceased at 68   Diabetes Father    Lung cancer Father        mesothelioma;  deceased 40s   Cancer Maternal Aunt        "bone ca"; unk. primary   Colon cancer Maternal Grandfather        dx in 59s; deceased 43   Colon cancer Maternal Aunt        dx in 16s; currently 61s   Lung cancer Maternal Grandmother        smoker; deceased 54   Lung cancer Paternal Grandmother        smoker; deceased 83s   Breast cancer Cousin  dx 50s; daughter of mat aunt with unk. primary cancer   Cancer Other        distant cousin; unknown primary   Colon cancer Other        dx 73s; currently 63; maternal half-sister   Bipolar disorder Sister    Schizophrenia Sister     Allergies  Allergen Reactions   Iodine    Bee Venom Hives and Rash   Cefoxitin Rash   Cefuroxime Axetil Hives and Rash   Cucumber Extract Rash   Gadolinium Derivatives Swelling, Other (See Comments) and Rash    NECK BECAME RED AND TONGUE WAS SWELLING SLIGHTLY PER PT NECK BECAME RED AND TONGUE WAS SWELLING SLIGHTLY PER PT NECK BECAME RED AND TONGUE WAS SWELLING SLIGHTLY PER PT   Iodinated Contrast Media Hives and Rash   Latex Rash   Other Hives and Rash    Bee Stings-swelling/hives/rash Wool (textile fiber)-Rash/itching   Tomato Rash    Red tomatoes    Review of Systems  Constitutional: Negative.   HENT: Negative.    Eyes: Negative.   Respiratory: Negative.    Cardiovascular: Negative.   Genitourinary: Negative.   Musculoskeletal: Negative.   Skin:  Positive for itching and rash.  Neurological: Negative.   Endo/Heme/Allergies: Negative.   Psychiatric/Behavioral: Negative.         Objective:   BP 126/72   Pulse (!) 110   Ht '5\' 3"'$  (1.6 m)   Wt 164 lb (74.4 kg)   LMP 08/24/2008   SpO2 94%   BMI 29.05 kg/m   Vitals:   04/11/22 1324  BP: 126/72  Pulse: (!) 110  Height: '5\' 3"'$  (1.6 m)  Weight: 164 lb (74.4 kg)  SpO2: 94%  BMI (Calculated): 29.06    Physical Exam Vitals reviewed.  Constitutional:      Appearance: Normal appearance.  HENT:     Head: Normocephalic.      Nose: Nose normal.     Mouth/Throat:     Mouth: Mucous membranes are moist.  Eyes:     Pupils: Pupils are equal, round, and reactive to light.  Cardiovascular:     Rate and Rhythm: Normal rate and regular rhythm.  Pulmonary:     Effort: Pulmonary effort is normal.     Breath sounds: Normal breath sounds.  Abdominal:     General: Bowel sounds are normal.     Palpations: Abdomen is soft.  Musculoskeletal:        General: Normal range of motion.     Cervical back: Normal range of motion and neck supple.  Skin:    General: Skin is warm and dry.  Neurological:     Mental Status: She is alert and oriented to person, place, and time.  Psychiatric:        Mood and Affect: Mood normal.        Behavior: Behavior normal.      No results found for any visits on 04/11/22.  Recent Results (from the past 2160 hour(s))  CBC with Differential/Platelet     Status: None   Collection Time: 03/31/22 12:00 PM  Result Value Ref Range   WBC 8.4 3.4 - 10.8 x10E3/uL   RBC 4.50 3.77 - 5.28 x10E6/uL   Hemoglobin 14.4 11.1 - 15.9 g/dL   Hematocrit 42.8 34.0 - 46.6 %   MCV 95 79 - 97 fL   MCH 32.0 26.6 - 33.0 pg   MCHC 33.6 31.5 - 35.7 g/dL   RDW 13.2 11.7 - 15.4 %  Platelets 243 150 - 450 x10E3/uL   Neutrophils 73 Not Estab. %   Lymphs 18 Not Estab. %   Monocytes 5 Not Estab. %   Eos 2 Not Estab. %   Basos 1 Not Estab. %   Neutrophils Absolute 6.2 1.4 - 7.0 x10E3/uL   Lymphocytes Absolute 1.5 0.7 - 3.1 x10E3/uL   Monocytes Absolute 0.4 0.1 - 0.9 x10E3/uL   EOS (ABSOLUTE) 0.2 0.0 - 0.4 x10E3/uL   Basophils Absolute 0.1 0.0 - 0.2 x10E3/uL   Immature Granulocytes 1 Not Estab. %   Immature Grans (Abs) 0.0 0.0 - 0.1 x10E3/uL  Comprehensive metabolic panel     Status: Abnormal   Collection Time: 03/31/22 12:00 PM  Result Value Ref Range   Glucose 109 (H) 70 - 99 mg/dL   BUN 16 6 - 24 mg/dL   Creatinine, Ser 1.29 (H) 0.57 - 1.00 mg/dL   eGFR 48 (L) >59 mL/min/1.73   BUN/Creatinine Ratio 12  9 - 23   Sodium 143 134 - 144 mmol/L   Potassium 4.0 3.5 - 5.2 mmol/L   Chloride 109 (H) 96 - 106 mmol/L   CO2 19 (L) 20 - 29 mmol/L   Calcium 9.5 8.7 - 10.2 mg/dL   Total Protein 6.8 6.0 - 8.5 g/dL   Albumin 4.2 3.8 - 4.9 g/dL   Globulin, Total 2.6 1.5 - 4.5 g/dL   Albumin/Globulin Ratio 1.6 1.2 - 2.2   Bilirubin Total <0.2 0.0 - 1.2 mg/dL   Alkaline Phosphatase 87 44 - 121 IU/L   AST 13 0 - 40 IU/L   ALT 9 0 - 32 IU/L  Lipid Panel w/o Chol/HDL Ratio     Status: Abnormal   Collection Time: 03/31/22 12:00 PM  Result Value Ref Range   Cholesterol, Total 119 100 - 199 mg/dL   Triglycerides 232 (H) 0 - 149 mg/dL   HDL 40 >39 mg/dL   VLDL Cholesterol Cal 36 5 - 40 mg/dL   LDL Chol Calc (NIH) 43 0 - 99 mg/dL  Hgb A1c w/o eAG     Status: Abnormal   Collection Time: 03/31/22 12:00 PM  Result Value Ref Range   Hgb A1c MFr Bld 5.8 (H) 4.8 - 5.6 %    Comment:          Prediabetes: 5.7 - 6.4          Diabetes: >6.4          Glycemic control for adults with diabetes: <7.0   TSH     Status: None   Collection Time: 03/31/22 12:00 PM  Result Value Ref Range   TSH 1.880 0.450 - 4.500 uIU/mL      Assessment & Plan:   Problem List Items Addressed This Visit       Musculoskeletal and Integument   Acne   Relevant Medications   benzoyl peroxide-erythromycin (BENZAMYCIN) gel     Other   HLD (hyperlipidemia)   Herpes zoster without complication - Primary   Relevant Medications   valACYclovir (VALTREX) 1000 MG tablet   Insomnia   Relevant Medications   traZODone (DESYREL) 100 MG tablet    Return in about 3 months (around 07/12/2022).   Total time spent: 35 minutes  Evern Bio, NP  04/11/2022

## 2022-04-18 DIAGNOSIS — R202 Paresthesia of skin: Secondary | ICD-10-CM | POA: Diagnosis not present

## 2022-04-18 DIAGNOSIS — G894 Chronic pain syndrome: Secondary | ICD-10-CM | POA: Diagnosis not present

## 2022-04-18 DIAGNOSIS — M797 Fibromyalgia: Secondary | ICD-10-CM | POA: Diagnosis not present

## 2022-04-18 DIAGNOSIS — M62830 Muscle spasm of back: Secondary | ICD-10-CM | POA: Diagnosis not present

## 2022-04-18 DIAGNOSIS — Z79891 Long term (current) use of opiate analgesic: Secondary | ICD-10-CM | POA: Diagnosis not present

## 2022-04-18 DIAGNOSIS — M179 Osteoarthritis of knee, unspecified: Secondary | ICD-10-CM | POA: Diagnosis not present

## 2022-04-28 ENCOUNTER — Ambulatory Visit
Admission: RE | Admit: 2022-04-28 | Discharge: 2022-04-28 | Disposition: A | Discharge status: Home / Self Care | Payer: 59 | Source: Ambulatory Visit | Attending: Nurse Practitioner | Admitting: Nurse Practitioner

## 2022-04-28 DIAGNOSIS — C50412 Malignant neoplasm of upper-outer quadrant of left female breast: Secondary | ICD-10-CM | POA: Insufficient documentation

## 2022-04-28 DIAGNOSIS — Z1231 Encounter for screening mammogram for malignant neoplasm of breast: Secondary | ICD-10-CM | POA: Diagnosis not present

## 2022-04-28 DIAGNOSIS — Z170 Estrogen receptor positive status [ER+]: Secondary | ICD-10-CM

## 2022-04-28 DIAGNOSIS — R92313 Mammographic fatty tissue density, bilateral breasts: Secondary | ICD-10-CM | POA: Diagnosis not present

## 2022-05-02 DIAGNOSIS — N1831 Chronic kidney disease, stage 3a: Secondary | ICD-10-CM | POA: Diagnosis not present

## 2022-05-03 ENCOUNTER — Ambulatory Visit: Payer: Medicare Other | Admitting: Nurse Practitioner

## 2022-05-03 ENCOUNTER — Encounter: Payer: Self-pay | Admitting: Nurse Practitioner

## 2022-05-03 ENCOUNTER — Inpatient Hospital Stay: Payer: 59 | Attending: Nurse Practitioner | Admitting: Nurse Practitioner

## 2022-05-03 ENCOUNTER — Encounter: Payer: Medicare Other | Admitting: Obstetrics and Gynecology

## 2022-05-03 VITALS — BP 92/76 | HR 83 | Temp 98.4°F | Wt 164.2 lb

## 2022-05-03 DIAGNOSIS — Z800 Family history of malignant neoplasm of digestive organs: Secondary | ICD-10-CM | POA: Diagnosis not present

## 2022-05-03 DIAGNOSIS — M2550 Pain in unspecified joint: Secondary | ICD-10-CM | POA: Diagnosis not present

## 2022-05-03 DIAGNOSIS — Z853 Personal history of malignant neoplasm of breast: Secondary | ICD-10-CM | POA: Diagnosis not present

## 2022-05-03 DIAGNOSIS — Z9221 Personal history of antineoplastic chemotherapy: Secondary | ICD-10-CM | POA: Insufficient documentation

## 2022-05-03 DIAGNOSIS — G8929 Other chronic pain: Secondary | ICD-10-CM | POA: Diagnosis not present

## 2022-05-03 DIAGNOSIS — M8580 Other specified disorders of bone density and structure, unspecified site: Secondary | ICD-10-CM | POA: Insufficient documentation

## 2022-05-03 DIAGNOSIS — Z79899 Other long term (current) drug therapy: Secondary | ICD-10-CM | POA: Insufficient documentation

## 2022-05-03 DIAGNOSIS — F419 Anxiety disorder, unspecified: Secondary | ICD-10-CM | POA: Insufficient documentation

## 2022-05-03 DIAGNOSIS — Z170 Estrogen receptor positive status [ER+]: Secondary | ICD-10-CM | POA: Insufficient documentation

## 2022-05-03 DIAGNOSIS — Z801 Family history of malignant neoplasm of trachea, bronchus and lung: Secondary | ICD-10-CM | POA: Diagnosis not present

## 2022-05-03 DIAGNOSIS — Z08 Encounter for follow-up examination after completed treatment for malignant neoplasm: Secondary | ICD-10-CM

## 2022-05-03 DIAGNOSIS — Z923 Personal history of irradiation: Secondary | ICD-10-CM | POA: Insufficient documentation

## 2022-05-03 DIAGNOSIS — Z7982 Long term (current) use of aspirin: Secondary | ICD-10-CM | POA: Insufficient documentation

## 2022-05-03 DIAGNOSIS — F17210 Nicotine dependence, cigarettes, uncomplicated: Secondary | ICD-10-CM | POA: Diagnosis not present

## 2022-05-03 DIAGNOSIS — Z79624 Long term (current) use of inhibitors of nucleotide synthesis: Secondary | ICD-10-CM | POA: Insufficient documentation

## 2022-05-03 DIAGNOSIS — C50412 Malignant neoplasm of upper-outer quadrant of left female breast: Secondary | ICD-10-CM | POA: Diagnosis not present

## 2022-05-03 DIAGNOSIS — J449 Chronic obstructive pulmonary disease, unspecified: Secondary | ICD-10-CM | POA: Diagnosis not present

## 2022-05-03 DIAGNOSIS — Z803 Family history of malignant neoplasm of breast: Secondary | ICD-10-CM | POA: Diagnosis not present

## 2022-05-03 NOTE — Progress Notes (Unsigned)
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  She has no residual neurologic deficits.  She had a cardiac monitor implanted in her left chest which is palpable but otherwise denies new lumps or masses. She currently feels well and is asymptomatic.  She has no neurologic complaints.  She denies any recent fevers or illnesses.  She has a good appetite and denies weight loss.  She has no chest pain, cough, hemoptysis, or shortness of breath. She denies any nausea, vomiting, constipation, or diarrhea.  She has no urinary complaints.  Patient offers no further specific complaints today.   REVIEW OF SYSTEMS:   Review of Systems  Constitutional: Negative.  Negative for fever, malaise/fatigue and weight loss.  Respiratory: Negative.  Negative for cough, hemoptysis and shortness of breath.   Cardiovascular: Negative.  Negative for chest pain, palpitations and leg swelling.  Gastrointestinal: Negative.  Negative for abdominal pain.  Genitourinary: Negative.  Negative for dysuria.  Musculoskeletal: Negative.  Negative for joint pain.  Skin: Negative.  Negative for itching and rash.  Neurological: Negative.   Negative for dizziness, sensory change, focal weakness, loss of consciousness, weakness and headaches.  Psychiatric/Behavioral: Negative.  The patient is not nervous/anxious and does not have insomnia.   As per HPI. Otherwise, a complete review of systems is negative.  PAST MEDICAL HISTORY: Past Medical History:  Diagnosis Date   Abnormal Pap smear of cervix    01/2015 ascus/neg- 04/2015 ascus/neg   Allergy    Anxiety    Arthritis    Asthma    Breast cancer 2019   Chronic kidney disease    COPD (chronic obstructive pulmonary disease)    DDD (degenerative disc disease), lumbar    Depression    Elevated lipids    Fibromyalgia    Fibromyalgia    Genetic testing 03/09/2017   Multi-Cancer panel (83 genes) @ [facility] - No pathogenic mutations detected   GERD (gastroesophageal reflux disease)    History of IBS    Hyperthyroidism    Joint disease    Migraine    Migraine    Oxygen deficiency    Personal history of chemotherapy    Personal history of radiation therapy    Restless leg syndrome    Sleep apnea    Does not use C-PAP, cannot tolerate mask    PAST SURGICAL HISTORY: Past Surgical History:  Procedure Laterality Date   ABDOMINAL HYSTERECTOMY  2008   BREAST BIOPSY Left 02/02/2017   [place] core positive   BREAST LUMPECTOMY Left 02/17/2017   BREAST LUMPECTOMY WITH NEEDLE LOCALIZATION Left 02/17/2017   Procedure: BREAST LUMPECTOMY WITH NEEDLE LOCALIZATION;  Surgeon: Vickie Epley, MD;  Location: [facility];  Service: General;  Laterality: Left;   CARPAL TUNNEL RELEASE Bilateral    cryotherapy     DILATION AND CURETTAGE  OF UTERUS     ENDOMETRIAL ABLATION     LAPAROSCOPIC OOPHERECTOMY Left    unsure which side but thinks its the left   LYSIS OF ADHESION Left 10/14/2015   Procedure: LYSIS OF ADHESION;  Surgeon: Thornton Park, MD;  Location: [facility];  Service: Orthopedics;  Laterality: Left;   NECK SURGERY     lower neck fusion rods and screws   OOPHORECTOMY     one ovary  removed    PORTA CATH INSERTION N/A 03/08/2017   Procedure: PORTA CATH INSERTION;  Surgeon: Katha Cabal, MD;  Location: [facility];  Service: Cardiovascular;  Laterality: N/A;   RESECTION DISTAL CLAVICAL Left 10/14/2015   Procedure: RESECTION DISTAL CLAVICAL;  Surgeon: Thornton Park, MD;  Location: [facility];  Service: Orthopedics;  Laterality: Left;   SENTINEL NODE BIOPSY Left 02/17/2017   Procedure: SENTINEL NODE BIOPSY;  Surgeon: Vickie Epley, MD;  Location: [facility];  Service: General;  Laterality: Left;   SHOULDER ARTHROSCOPY WITH OPEN ROTATOR CUFF REPAIR Left 10/14/2015   Procedure: SHOULDER ARTHROSCOPY WITH OPEN ROTATOR CUFF REPAIR;  Surgeon: Thornton Park, MD;  Location: [facility];  Service: Orthopedics;  Laterality: Left;   SHOULDER ARTHROSCOPY WITH OPEN ROTATOR CUFF REPAIR AND DISTAL CLAVICLE ACROMINECTOMY Right 09/03/2014   Procedure: RIGHT SHOULDER ARTHROSCOPY WITH MINI OPEN ROTATOR CUFF TEAR;  Surgeon: Thornton Park, MD;  Location: [facility];  Service: Orthopedics;  Laterality: Right;  biceps tenodesis, arthroscopic subacromial decompression and distal clavicle incision   spinal injections     SUBACROMIAL DECOMPRESSION Left 10/14/2015   Procedure: SUBACROMIAL DECOMPRESSION;  Surgeon: Thornton Park, MD;  Location: [facility];  Service: Orthopedics;  Laterality: Left;   TEE WITHOUT CARDIOVERSION N/A 02/22/2021   Procedure: TRANSESOPHAGEAL ECHOCARDIOGRAM (TEE);  Surgeon: [name], MD;  Location: [facility];  Service: Cardiovascular;  Laterality: N/A;    FAMILY HISTORY: Family History  Problem Relation Age of Onset   Breast cancer Mother 26       Glioblastoma also; deceased at 66   Diabetes Father    Lung cancer Father        mesothelioma; deceased 75s   Cancer Maternal Aunt        "bone ca"; unk. primary   Colon cancer Maternal Grandfather        dx in 32s; deceased 52   Colon cancer Maternal Aunt        dx in 42s; currently 36s   Lung cancer Maternal  Grandmother        smoker; deceased 8   Lung cancer Paternal Grandmother        smoker; deceased 42s   Breast cancer Cousin        dx 3s; daughter of mat aunt with unk. primary cancer   Cancer Other        distant cousin; unknown primary   Colon cancer Other        dx 16s; currently 75; maternal half-sister   Bipolar disorder Sister    Schizophrenia Sister     ADVANCED DIRECTIVES (Y/N):  N  HEALTH MAINTENANCE: Social History   Tobacco Use   Smoking status: Every Day    Packs/day: 1    Types: Cigarettes   Smokeless tobacco: Never  Vaping Use   Vaping Use: Never used  Substance Use Topics   Alcohol use: No    Alcohol/week: 0.0 standard drinks of alcohol   Drug use: No     Colonoscopy:  PAP:  Bone density:  Lipid  panel:  Allergies  Allergen Reactions   Iodine    Bee Venom Hives and Rash   Cefoxitin Rash   Cefuroxime Axetil Hives and Rash   Cucumber Extract Rash   Gadolinium Derivatives Swelling, Other (See Comments) and Rash    NECK BECAME RED AND TONGUE WAS SWELLING SLIGHTLY PER PT NECK BECAME RED AND TONGUE WAS SWELLING SLIGHTLY PER PT NECK BECAME RED AND TONGUE WAS SWELLING SLIGHTLY PER PT   Iodinated Contrast Media Hives and Rash   Latex Rash   Other Hives and Rash    Bee Stings-swelling/hives/rash Wool (textile fiber)-Rash/itching   Tomato Rash    Red tomatoes    Current Outpatient Medications  Medication Sig Dispense Refill   baclofen (LIORESAL) 10 MG tablet Take 10 mg by mouth 3 (three) times daily as needed for muscle spasms (typically 2-3 times daily).      benzoyl peroxide-erythromycin (BENZAMYCIN) gel Apply to affected area 2 times daily 47 g 0   calcium carbonate (OS-CAL) 600 MG TABS tablet Take by mouth.     cholecalciferol (VITAMIN D) 1000 UNITS tablet Take 1,000 Units by mouth 2 (two) times daily.      clindamycin-benzoyl peroxide (BENZACLIN) gel SMARTSIG:Sparingly Topical Twice Daily     diclofenac Sodium (VOLTAREN) 1 % GEL SMARTSIG:2-4  Gram(s) Topical 4 Times Daily     Multiple Vitamins-Minerals (HAIR SKIN AND NAILS FORMULA) TABS Take 2 tablets by mouth daily.     OXYCODONE HCL PO Take 15 mg by mouth as needed. Take 4-6 times a day as needed.     pregabalin (LYRICA) 25 MG capsule Take 1 capsule by mouth 2 (two) times daily.     rOPINIRole (REQUIP) 1 MG tablet Take 1.5 mg by mouth at bedtime.     rosuvastatin (CRESTOR) 40 MG tablet Take 40 mg by mouth at bedtime.      topiramate (TOPAMAX) 100 MG tablet Take 200 mg by mouth daily.      traZODone (DESYREL) 100 MG tablet Take 1 tablet (100 mg total) by mouth at bedtime. (Patient taking differently: Take 300 mg by mouth at bedtime.) 90 tablet 1   valACYclovir (VALTREX) 1000 MG tablet Take 1 tablet (1,000 mg total) by mouth 2 (two) times daily. 14 tablet 3   vitamin E 200 UNIT capsule Take 200 Units by mouth daily.     aspirin EC 81 MG EC tablet Take 1 tablet (81 mg total) by mouth daily. Swallow whole. 30 tablet 11   EPINEPHrine (EPIPEN JR) 0.15 MG/0.3ML injection Inject 0.15 mg into the muscle as needed for anaphylaxis. (Patient not taking: Reported on 04/29/2021)     NARCAN 4 MG/0.1ML LIQD nasal spray kit CALL 911. ADMINISTER A SINGLE SPRAY OF NARCAN IN ONE NOSTRIL. REPEAT EVERY 3 MINUTES AS NEEDED IF NO OR MINIMAL RESPONSE. (Patient not taking: Reported on 04/29/2021)     promethazine (PHENERGAN) 25 MG tablet Take 25 mg by mouth every 6 (six) hours as needed for nausea or vomiting. (Patient not taking: Reported on 04/29/2021)     rizatriptan (MAXALT-MLT) 10 MG disintegrating tablet Take 10 mg by mouth as needed for migraine. May repeat in 2 hours if needed (Patient not taking: Reported on 04/29/2021)     No current facility-administered medications for this visit.   Facility-Administered Medications Ordered in Other Visits  Medication Dose Route Frequency Provider Last Rate Last Admin   heparin lock flush 100 unit/mL  500 Units Intravenous Once Lloyd Huger, MD  sodium  chloride flush (NS) 0.9 % injection 10 mL  10 mL Intravenous PRN Lloyd Huger, MD   10 mL at 08/21/17 1057    OBJECTIVE: Vitals:   05/03/22 1310  BP: 92/76  Pulse: 83  Temp: 98.4 F (36.9 C)  SpO2: 98%      Body mass index is 29.09 kg/m.    ECOG FS:0 - Asymptomatic  General: Well-developed, well-nourished, no acute distress. Eyes: Pink conjunctiva, anicteric sclera. HEENT: Normocephalic, moist mucous membranes. Breast: Breast exam was performed in seated and lying down position. Patient is status post left lumpectomy with a well-healed surgical scar. Cardiac monitor is palpable in left breast with well healed surgical scar. No evidence of axillary adenopathy. No evidence of any palpable masses or lumps in the right breast. No evidence of right axillary adenopathy Lungs: No audible wheezing or coughing. Heart: Regular rate and rhythm. Abdomen: Soft, nontender, no obvious distention. Musculoskeletal: No edema, cyanosis, or clubbing. Neuro: Alert, answering all questions appropriately. Cranial nerves grossly intact. Skin: No rashes or petechiae noted. Psych: Normal affect.   LAB RESULTS:  Lab Results  Component Value Date   NA 143 03/31/2022   K 4.0 03/31/2022   CL 109 (H) 03/31/2022   CO2 19 (L) 03/31/2022   GLUCOSE 109 (H) 03/31/2022   BUN 16 03/31/2022   CREATININE 1.29 (H) 03/31/2022   CALCIUM 9.5 03/31/2022   PROT 6.8 03/31/2022   ALBUMIN 4.2 03/31/2022   AST 13 03/31/2022   ALT 9 03/31/2022   ALKPHOS 87 03/31/2022   BILITOT <0.2 03/31/2022   GFRNONAA >60 02/20/2021   GFRAA >60 09/11/2017    Lab Results  Component Value Date   WBC 8.4 03/31/2022   NEUTROABS 6.2 03/31/2022   HGB 14.4 03/31/2022   HCT 42.8 03/31/2022   MCV 95 03/31/2022   PLT 243 03/31/2022     STUDIES: MM 3D SCREEN BREAST BILATERAL  Result Date: 04/29/2022 CLINICAL DATA:  Screening. EXAM: DIGITAL SCREENING BILATERAL MAMMOGRAM WITH TOMOSYNTHESIS AND CAD TECHNIQUE: Bilateral  screening digital craniocaudal and mediolateral oblique mammograms were obtained. Bilateral screening digital breast tomosynthesis was performed. The images were evaluated with computer-aided detection. COMPARISON:  Previous exam(s). ACR Breast Density Category a: The breasts are almost entirely fatty. FINDINGS: There are no findings suspicious for malignancy. IMPRESSION: No mammographic evidence of malignancy. A result letter of this screening mammogram will be mailed directly to the patient. RECOMMENDATION: Screening mammogram in one year. (Code:SM-B-01Y) BI-RADS CATEGORY  1: Negative. Electronically Signed   By: Audie Pinto M.D.   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  PLAN:    1. 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  Patient initiated adjuvant chemotherapy with Taxol and Herceptin on March 13, 2017.  She completed Taxol on May 29, 2017.  Patient then received 4 additional cycles of Herceptin maintenance treatment, but this was permanently discontinued on August 21, 2017 secondary to persistently low EF on MUGA scan.  Letrozole was discontinued secondary to hot flashes and patient was transitioned to anastrozole.  Although anastrozole was not the etiology of her CVA, patient is hesitant to reinitiate treatment.  She expressed understanding that her risk of recurrence increases without taking an aromatase inhibitor for total of 5 years.  No intervention is needed at this  time.  Patient most recent mammogram on April 26, 2021 was reported as BI-RADS 1.  Repeat in March 2024. Transition to annual visits once patient is 5 years from completing treatment, estimate in July 2024.  2.  Genetic testing: Negative.  No further interventions are  needed. 3.  Joint pain: Patient does not complain of this today. 4.  Anxiety: Continue evaluation and treatment per psychiatry. 5.  Chronic pain: Patient does not complain of this today.  Continue appointments with pain clinic as scheduled. 6.  Cardiac disease: Herceptin was previously discontinued permanently.  Continue follow-up and evaluation per cardiology.  Patient's most recent cardiac echo on February 22, 2021 revealed an ejection fraction of 60 to 65%.  Also reported was a positive bubble study consistent with a possible small PFO suggestive of intra-atrial shunt.   7.  Osteopenia: Bone mineral density on September 26, 2020 was unchanged from previous with a T score approximately -2.0.  Repeat bone density in August 2023 was improved with t score -1.8. Continue calcium and vitamin D supplementation.  Repeat in August 2025.  March- mammogram 6 mo- see me for breast cancer surveillance- [place]  Patient expressed understanding and was in agreement with this plan. She also understands that She can call clinic at any time with any questions, concerns, or complaints.    Cancer Staging  Malignant neoplasm of upper-outer quadrant of left breast in female, estrogen receptor positive Staging form: Breast, AJCC 8th Edition - Clinical stage from 02/07/2017: Stage IA (cT1c, cN0, cM0, G1, ER+, PR+, HER2+) - Signed by Lloyd Huger, MD on 02/11/2017 Neoadjuvant therapy: No Histologic grading system: 3 grade system Laterality: Left   Verlon Au, NP   05/03/2022

## 2022-05-04 ENCOUNTER — Encounter: Payer: Self-pay | Admitting: Obstetrics and Gynecology

## 2022-05-04 DIAGNOSIS — R202 Paresthesia of skin: Secondary | ICD-10-CM | POA: Insufficient documentation

## 2022-05-04 DIAGNOSIS — M75110 Incomplete rotator cuff tear or rupture of unspecified shoulder, not specified as traumatic: Secondary | ICD-10-CM | POA: Insufficient documentation

## 2022-06-02 DIAGNOSIS — J449 Chronic obstructive pulmonary disease, unspecified: Secondary | ICD-10-CM | POA: Diagnosis not present

## 2022-06-09 ENCOUNTER — Other Ambulatory Visit: Payer: Self-pay | Admitting: Nurse Practitioner

## 2022-06-09 ENCOUNTER — Encounter: Payer: Self-pay | Admitting: Nurse Practitioner

## 2022-06-09 DIAGNOSIS — G4700 Insomnia, unspecified: Secondary | ICD-10-CM

## 2022-06-09 MED ORDER — TRAZODONE HCL 300 MG PO TABS: 300.0000 mg | ORAL_TABLET | Freq: Every day | ORAL | 1 refills | Status: DC

## 2022-06-20 DIAGNOSIS — M179 Osteoarthritis of knee, unspecified: Secondary | ICD-10-CM | POA: Diagnosis not present

## 2022-06-20 DIAGNOSIS — Z5181 Encounter for therapeutic drug level monitoring: Secondary | ICD-10-CM | POA: Diagnosis not present

## 2022-06-20 DIAGNOSIS — R202 Paresthesia of skin: Secondary | ICD-10-CM | POA: Diagnosis not present

## 2022-06-20 DIAGNOSIS — G894 Chronic pain syndrome: Secondary | ICD-10-CM | POA: Diagnosis not present

## 2022-06-20 DIAGNOSIS — M62830 Muscle spasm of back: Secondary | ICD-10-CM | POA: Diagnosis not present

## 2022-06-20 DIAGNOSIS — Z79891 Long term (current) use of opiate analgesic: Secondary | ICD-10-CM | POA: Diagnosis not present

## 2022-06-20 DIAGNOSIS — M5134 Other intervertebral disc degeneration, thoracic region: Secondary | ICD-10-CM | POA: Diagnosis not present

## 2022-06-24 NOTE — Patient Instructions (Signed)
Preventive Care 40-58 Years Old, Female Preventive care refers to lifestyle choices and visits with your health care provider that can promote health and wellness. Preventive care visits are also called wellness exams. What can I expect for my preventive care visit? Counseling Your health care provider may ask you questions about your: Medical history, including: Past medical problems. Family medical history. Pregnancy history. Current health, including: Menstrual cycle. Method of birth control. Emotional well-being. Home life and relationship well-being. Sexual activity and sexual health. Lifestyle, including: Alcohol, nicotine or tobacco, and drug use. Access to firearms. Diet, exercise, and sleep habits. Work and work environment. Sunscreen use. Safety issues such as seatbelt and bike helmet use. Physical exam Your health care provider will check your: Height and weight. These may be used to calculate your BMI (body mass index). BMI is a measurement that tells if you are at a healthy weight. Waist circumference. This measures the distance around your waistline. This measurement also tells if you are at a healthy weight and may help predict your risk of certain diseases, such as type 2 diabetes and high blood pressure. Heart rate and blood pressure. Body temperature. Skin for abnormal spots. What immunizations do I need?  Vaccines are usually given at various ages, according to a schedule. Your health care provider will recommend vaccines for you based on your age, medical history, and lifestyle or other factors, such as travel or where you work. What tests do I need? Screening Your health care provider may recommend screening tests for certain conditions. This may include: Lipid and cholesterol levels. Diabetes screening. This is done by checking your blood sugar (glucose) after you have not eaten for a while (fasting). Pelvic exam and Pap test. Hepatitis B test. Hepatitis C  test. HIV (human immunodeficiency virus) test. STI (sexually transmitted infection) testing, if you are at risk. Lung cancer screening. Colorectal cancer screening. Mammogram. Talk with your health care provider about when you should start having regular mammograms. This may depend on whether you have a family history of breast cancer. BRCA-related cancer screening. This may be done if you have a family history of breast, ovarian, tubal, or peritoneal cancers. Bone density scan. This is done to screen for osteoporosis. Talk with your health care provider about your test results, treatment options, and if necessary, the need for more tests. Follow these instructions at home: Eating and drinking  Eat a diet that includes fresh fruits and vegetables, whole grains, lean protein, and low-fat dairy products. Take vitamin and mineral supplements as recommended by your health care provider. Do not drink alcohol if: Your health care provider tells you not to drink. You are pregnant, may be pregnant, or are planning to become pregnant. If you drink alcohol: Limit how much you have to 0-1 drink a day. Know how much alcohol is in your drink. In the [place] one drink equals one 12 oz bottle of beer (355 mL), one 5 oz glass of wine (148 mL), or one 1 oz glass of hard liquor (44 mL). Lifestyle Brush your teeth every morning and night with fluoride toothpaste. Floss one time each day. Exercise for at least 30 minutes 5 or more days each week. Do not use any products that contain nicotine or tobacco. These products include cigarettes, chewing tobacco, and vaping devices, such as e-cigarettes. If you need help quitting, ask your health care provider. Do not use drugs. If you are sexually active, practice safe sex. Use a condom or other form of protection to   prevent STIs. If you do not wish to become pregnant, use a form of birth control. If you plan to become pregnant, see your health care provider for a  prepregnancy visit. Take aspirin only as told by your health care provider. Make sure that you understand how much to take and what form to take. Work with your health care provider to find out whether it is safe and beneficial for you to take aspirin daily. Find healthy ways to manage stress, such as: Meditation, yoga, or listening to music. Journaling. Talking to a trusted person. Spending time with friends and family. Minimize exposure to UV radiation to reduce your risk of skin cancer. Safety Always wear your seat belt while driving or riding in a vehicle. Do not drive: If you have been drinking alcohol. Do not ride with someone who has been drinking. When you are tired or distracted. While texting. If you have been using any mind-altering substances or drugs. Wear a helmet and other protective equipment during sports activities. If you have firearms in your house, make sure you follow all gun safety procedures. Seek help if you have been physically or sexually abused. What's next? Visit your health care provider once a year for an annual wellness visit. Ask your health care provider how often you should have your eyes and teeth checked. Stay up to date on all vaccines. This information is not intended to replace advice given to you by your health care provider. Make sure you discuss any questions you have with your health care provider. Document Revised: 07/15/2020 Document Reviewed: 07/15/2020 Elsevier Patient Education  2024 Elsevier Inc. Breast Self-Awareness Breast self-awareness is knowing how your breasts look and feel. You need to: Check your breasts on a regular basis. Tell your doctor about any changes. Become familiar with the look and feel of your breasts. This can help you catch a breast problem while it is still small and can be treated. You should do breast self-exams even if you have breast implants. What you need: A mirror. A well-lit room. A pillow or other  soft object. How to do a breast self-exam Follow these steps to do a breast self-exam: Look for changes  Take off all the clothes above your waist. Stand in front of a mirror in a room with good lighting. Put your hands down at your sides. Compare your breasts in the mirror. Look for any difference between them, such as: A difference in shape. A difference in size. Wrinkles, dips, and bumps in one breast and not the other. Look at each breast for changes in the skin, such as: Redness. Scaly areas. Skin that has gotten thicker. Dimpling. Open sores (ulcers). Look for changes in your nipples, such as: Fluid coming out of a nipple. Fluid around a nipple. Bleeding. Dimpling. Redness. A nipple that looks pushed in (retracted), or that has changed position. Feel for changes Lie on your back. Feel each breast. To do this: Pick a breast to feel. Place a pillow under the shoulder closest to that breast. Put the arm closest to that breast behind your head. Feel the nipple area of that breast using the hand of your other arm. Feel the area with the pads of your three middle fingers by making small circles with your fingers. Use light, medium, and firm pressure. Continue the overlapping circles, moving downward over the breast. Keep making circles with your fingers. Stop when you feel your ribs. Start making circles with your fingers again, this time going   upward until you reach your collarbone. Then, make circles outward across your breast and into your armpit area. Squeeze your nipple. Check for discharge and lumps. Repeat these steps to check your other breast. Sit or stand in the tub or shower. With soapy water on your skin, feel each breast the same way you did when you were lying down. Write down what you find Writing down what you find can help you remember what to tell your doctor. Write down: What is normal for each breast. Any changes you find in each breast. These  include: The kind of changes you find. A tender or painful breast. Any lump you find. Write down its size and where it is. When you last had your monthly period (menstrual cycle). General tips If you are breastfeeding, the best time to check your breasts is after you feed your baby or after you use a breast pump. If you get monthly bleeding, the best time to check your breasts is 5-7 days after your monthly cycle ends. With time, you will become comfortable with the self-exam. You will also start to know if there are changes in your breasts. Contact a doctor if: You see a change in the shape or size of your breasts or nipples. You see a change in the skin of your breast or nipples, such as red or scaly skin. You have fluid coming from your nipples that is not normal. You find a new lump or thick area. You have breast pain. You have any concerns about your breast health. Summary Breast self-awareness includes looking for changes in your breasts and feeling for changes within your breasts. You should do breast self-awareness in front of a mirror in a well-lit room. If you get monthly periods (menstrual cycles), the best time to check your breasts is 5-7 days after your period ends. Tell your doctor about any changes you see in your breasts. Changes include changes in size, changes on the skin, painful or tender breasts, or fluid from your nipples that is not normal. This information is not intended to replace advice given to you by your health care provider. Make sure you discuss any questions you have with your health care provider. Document Revised: 06/24/2021 Document Reviewed: 11/19/2020 Elsevier Patient Education  2024 Elsevier Inc.  
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
## 2022-06-28 ENCOUNTER — Other Ambulatory Visit (HOSPITAL_COMMUNITY)
Admission: RE | Admit: 2022-06-28 | Discharge: 2022-06-28 | Disposition: A | Discharge status: Home / Self Care | Payer: 59 | Source: Ambulatory Visit | Attending: Obstetrics and Gynecology | Admitting: Obstetrics and Gynecology

## 2022-06-28 ENCOUNTER — Ambulatory Visit (INDEPENDENT_AMBULATORY_CARE_PROVIDER_SITE_OTHER): Payer: 59 | Admitting: Obstetrics and Gynecology

## 2022-06-28 VITALS — BP 89/75 | HR 108 | Resp 16 | Ht 63.0 in | Wt 154.6 lb

## 2022-06-28 DIAGNOSIS — N183 Chronic kidney disease, stage 3 unspecified: Secondary | ICD-10-CM

## 2022-06-28 DIAGNOSIS — N907 Vulvar cyst: Secondary | ICD-10-CM

## 2022-06-28 DIAGNOSIS — Z1151 Encounter for screening for human papillomavirus (HPV): Secondary | ICD-10-CM | POA: Diagnosis present

## 2022-06-28 DIAGNOSIS — Z124 Encounter for screening for malignant neoplasm of cervix: Secondary | ICD-10-CM | POA: Insufficient documentation

## 2022-06-28 DIAGNOSIS — Z01419 Encounter for gynecological examination (general) (routine) without abnormal findings: Secondary | ICD-10-CM | POA: Insufficient documentation

## 2022-06-28 DIAGNOSIS — N951 Menopausal and female climacteric states: Secondary | ICD-10-CM

## 2022-06-28 DIAGNOSIS — I639 Cerebral infarction, unspecified: Secondary | ICD-10-CM

## 2022-06-29 ENCOUNTER — Encounter: Payer: Self-pay | Admitting: Obstetrics and Gynecology

## 2022-06-29 ENCOUNTER — Telehealth: Payer: Self-pay

## 2022-06-29 NOTE — Telephone Encounter (Signed)
General Review Call  General Review [facility]) Chart Review No Task Details Adherence Review Does the [facility] have access to medication refill data?: No Disease State Questions Able to connect with the Patient?: Yes Did patient have any problems with their health recently?: No Have you had any admissions or emergency room visits or worsening of your condition(s) since last visit?: No Have you had any visits with new specialists or providers since your last visit?: No Have you had any new health care problem(s) since your last visit?: No Have you run out of any of your medications since you last spoke with your clinical lead?: No Are there any medications you are not taking as prescribed?: No Are you having any issues or side effects with your medications?: No Do you have any other health concerns or questions you want to discuss with your Clinical lead  before your next visit?: No Are there any health concerns that you feel we can do a better job addressing?: No Any falls since last visit?: No Any increased or uncontrolled pain since last visit?: No Next visit Type:: Phone Visit with:: CP Date:: 07/04/2022 Time:: 4:00 pm Additional Details: Patient states that she is still real anxious about her brother. She has NOT stopped smoking. Engagement Notes dimock, eugenia on 06/29/2022 09:50 AM   HC review Call: CCM Billed Time: 10 minutes (06/29/22)   Laury Axon on 06/24/2022 12:56 PM Add questions 1. how is she doing (she was stressed and anxious about her brother) 2. if she is smoking still  schedule a F/u please Clinical Lead Review Review Adherence gaps identified?: No Drug Therapy Problems identified?: No Assessment: No new needs identified. Follow-up as scheduled Engagement Notes Lynann Bologna on 06/29/2022 11:07 AM Reviewed 

## 2022-07-01 ENCOUNTER — Other Ambulatory Visit: Payer: Self-pay | Admitting: Nurse Practitioner

## 2022-07-03 DIAGNOSIS — J449 Chronic obstructive pulmonary disease, unspecified: Secondary | ICD-10-CM | POA: Diagnosis not present

## 2022-07-04 ENCOUNTER — Telehealth: Payer: 59

## 2022-07-04 LAB — CYTOLOGY - PAP
Comment: NEGATIVE
Diagnosis: NEGATIVE
High risk HPV: NEGATIVE

## 2022-07-05 ENCOUNTER — Ambulatory Visit: Payer: 59

## 2022-07-05 DIAGNOSIS — Z720 Tobacco use: Secondary | ICD-10-CM

## 2022-07-05 NOTE — Chronic Care Management (AMB) (Signed)
Focused Pharmacist Outreach  [name],Briana  56 years, Female  DOB: 1965-05-10  M: [phone]  __________________________________________________ Outreach Details Details of the Visit: Overall doing ok!  New Pain doctor put her back on Dayvigo, trazodone wasn't working. Prescription must be given by Chelsa, when she sees him.  Anxiety: Gotten better. Has been spending time in the pool. Her brother went and got treatment and that helped the patient's anxiety. Trazodone is up to 300mg  but its not helping.  Smoking: about 1/2 ppd. She has thought about cutting back /stopping.  Up to date on Mammogram, DEXA scan She refuses Flu and COVID shots  PAP was normal. FPO in 3-4 months Date of next Pharmacist Follow-up: 10/26/2022 Documentation loaded into clinic EMR: Done Care Plan update needed?: N/A  Lynann Bologna, PharmD  Chart review  Televisit and documentation 

## 2022-07-08 ENCOUNTER — Other Ambulatory Visit: Payer: Self-pay | Admitting: Nurse Practitioner

## 2022-07-08 ENCOUNTER — Encounter: Payer: Self-pay | Admitting: Nurse Practitioner

## 2022-07-09 MED ORDER — ALBUTEROL SULFATE HFA 108 (90 BASE) MCG/ACT IN AERS
1.0000 | INHALATION_SPRAY | RESPIRATORY_TRACT | 0 refills | Status: DC | PRN
  Filled 2022-07-09: qty 1, fill #0
  Filled 2022-09-07: qty 8.5, 17d supply, fill #0
  Filled 2022-09-07 – 2022-09-08 (×3): qty 6.7, 17d supply, fill #0

## 2022-07-11 ENCOUNTER — Other Ambulatory Visit: Payer: Self-pay

## 2022-07-11 ENCOUNTER — Other Ambulatory Visit: Payer: 59

## 2022-07-11 ENCOUNTER — Other Ambulatory Visit: Payer: Self-pay | Admitting: Family

## 2022-07-11 ENCOUNTER — Other Ambulatory Visit (HOSPITAL_COMMUNITY): Payer: Self-pay

## 2022-07-11 DIAGNOSIS — E782 Mixed hyperlipidemia: Secondary | ICD-10-CM

## 2022-07-11 DIAGNOSIS — I10 Essential (primary) hypertension: Secondary | ICD-10-CM

## 2022-07-11 DIAGNOSIS — R7301 Impaired fasting glucose: Secondary | ICD-10-CM | POA: Diagnosis not present

## 2022-07-11 DIAGNOSIS — E669 Obesity, unspecified: Secondary | ICD-10-CM

## 2022-07-11 MED ORDER — ALBUTEROL SULFATE HFA 108 (90 BASE) MCG/ACT IN AERS: 1.0000 | INHALATION_SPRAY | Freq: Four times a day (QID) | RESPIRATORY_TRACT | 3 refills | Status: DC | PRN

## 2022-07-12 ENCOUNTER — Ambulatory Visit: Payer: 59 | Admitting: Nurse Practitioner

## 2022-07-12 ENCOUNTER — Other Ambulatory Visit (HOSPITAL_COMMUNITY): Payer: Self-pay

## 2022-07-12 LAB — CMP14+EGFR
ALT: 6 IU/L (ref 0–32)
AST: 10 IU/L (ref 0–40)
Albumin/Globulin Ratio: 1.7
Albumin: 4.1 g/dL (ref 3.8–4.9)
Alkaline Phosphatase: 99 IU/L (ref 44–121)
BUN/Creatinine Ratio: 15 (ref 9–23)
BUN: 18 mg/dL (ref 6–24)
Bilirubin Total: 0.2 mg/dL (ref 0.0–1.2)
CO2: 20 mmol/L (ref 20–29)
Calcium: 9.7 mg/dL (ref 8.7–10.2)
Chloride: 106 mmol/L (ref 96–106)
Creatinine, Ser: 1.22 mg/dL — ABNORMAL HIGH (ref 0.57–1.00)
Globulin, Total: 2.4 g/dL (ref 1.5–4.5)
Glucose: 98 mg/dL (ref 70–99)
Potassium: 4.7 mmol/L (ref 3.5–5.2)
Sodium: 140 mmol/L (ref 134–144)
Total Protein: 6.5 g/dL (ref 6.0–8.5)
eGFR: 52 mL/min/{1.73_m2} — ABNORMAL LOW (ref >59)

## 2022-07-12 LAB — CBC WITH DIFFERENTIAL
Basophils Absolute: 0.1 10*3/uL (ref 0.0–0.2)
Basos: 1 %
EOS (ABSOLUTE): 0.2 10*3/uL (ref 0.0–0.4)
Eos: 2 %
Hematocrit: 43 % (ref 34.0–46.6)
Hemoglobin: 14.4 g/dL (ref 11.1–15.9)
Immature Grans (Abs): 0 10*3/uL (ref 0.0–0.1)
Immature Granulocytes: 0 %
Lymphocytes Absolute: 1.5 10*3/uL (ref 0.7–3.1)
Lymphs: 17 %
MCH: 32.8 pg (ref 26.6–33.0)
MCHC: 33.5 g/dL (ref 31.5–35.7)
MCV: 98 fL — ABNORMAL HIGH (ref 79–97)
Monocytes Absolute: 0.4 10*3/uL (ref 0.1–0.9)
Monocytes: 4 %
Neutrophils Absolute: 6.9 10*3/uL (ref 1.4–7.0)
Neutrophils: 76 %
RBC: 4.39 x10E6/uL (ref 3.77–5.28)
RDW: 13.7 % (ref 11.7–15.4)
WBC: 9.1 10*3/uL (ref 3.4–10.8)

## 2022-07-12 LAB — TSH: TSH: 1.27 u[IU]/mL (ref 0.450–4.500)

## 2022-07-12 LAB — LIPID PANEL
Chol/HDL Ratio: 2.5 ratio (ref 0.0–4.4)
Cholesterol, Total: 111 mg/dL (ref 100–199)
HDL: 45 mg/dL (ref >39)
LDL Chol Calc (NIH): 42 mg/dL (ref 0–99)
Triglycerides: 142 mg/dL (ref 0–149)
VLDL Cholesterol Cal: 24 mg/dL (ref 5–40)

## 2022-07-12 LAB — HEMOGLOBIN A1C
Est. average glucose Bld gHb Est-mCnc: 123 mg/dL
Hgb A1c MFr Bld: 5.9 % — ABNORMAL HIGH (ref 4.8–5.6)

## 2022-07-13 DIAGNOSIS — I10 Essential (primary) hypertension: Secondary | ICD-10-CM | POA: Diagnosis not present

## 2022-07-13 DIAGNOSIS — G2581 Restless legs syndrome: Secondary | ICD-10-CM | POA: Diagnosis not present

## 2022-07-13 DIAGNOSIS — G43119 Migraine with aura, intractable, without status migrainosus: Secondary | ICD-10-CM | POA: Diagnosis not present

## 2022-07-13 DIAGNOSIS — J449 Chronic obstructive pulmonary disease, unspecified: Secondary | ICD-10-CM | POA: Diagnosis not present

## 2022-07-13 DIAGNOSIS — G4730 Sleep apnea, unspecified: Secondary | ICD-10-CM | POA: Diagnosis not present

## 2022-07-13 DIAGNOSIS — Z720 Tobacco use: Secondary | ICD-10-CM | POA: Diagnosis not present

## 2022-07-13 DIAGNOSIS — N182 Chronic kidney disease, stage 2 (mild): Secondary | ICD-10-CM | POA: Diagnosis not present

## 2022-07-13 DIAGNOSIS — I63119 Cerebral infarction due to embolism of unspecified vertebral artery: Secondary | ICD-10-CM | POA: Diagnosis not present

## 2022-07-13 DIAGNOSIS — I5032 Chronic diastolic (congestive) heart failure: Secondary | ICD-10-CM | POA: Diagnosis not present

## 2022-07-13 DIAGNOSIS — E782 Mixed hyperlipidemia: Secondary | ICD-10-CM | POA: Diagnosis not present

## 2022-07-13 DIAGNOSIS — I6340 Cerebral infarction due to embolism of unspecified cerebral artery: Secondary | ICD-10-CM | POA: Diagnosis not present

## 2022-07-18 DIAGNOSIS — R202 Paresthesia of skin: Secondary | ICD-10-CM | POA: Diagnosis not present

## 2022-07-18 DIAGNOSIS — G894 Chronic pain syndrome: Secondary | ICD-10-CM | POA: Diagnosis not present

## 2022-07-18 DIAGNOSIS — M179 Osteoarthritis of knee, unspecified: Secondary | ICD-10-CM | POA: Diagnosis not present

## 2022-07-18 DIAGNOSIS — M5134 Other intervertebral disc degeneration, thoracic region: Secondary | ICD-10-CM | POA: Diagnosis not present

## 2022-07-18 DIAGNOSIS — M62830 Muscle spasm of back: Secondary | ICD-10-CM | POA: Diagnosis not present

## 2022-07-19 ENCOUNTER — Ambulatory Visit (INDEPENDENT_AMBULATORY_CARE_PROVIDER_SITE_OTHER): Payer: 59 | Admitting: Nurse Practitioner

## 2022-07-19 VITALS — BP 100/55 | HR 115 | Ht 63.0 in | Wt 162.2 lb

## 2022-07-19 DIAGNOSIS — R7303 Prediabetes: Secondary | ICD-10-CM | POA: Insufficient documentation

## 2022-07-19 DIAGNOSIS — F5101 Primary insomnia: Secondary | ICD-10-CM

## 2022-07-19 DIAGNOSIS — E782 Mixed hyperlipidemia: Secondary | ICD-10-CM

## 2022-07-19 MED ORDER — DAYVIGO 10 MG PO TABS: 10.0000 mg | ORAL_TABLET | Freq: Every evening | ORAL | 2 refills | Status: DC

## 2022-07-19 NOTE — Progress Notes (Signed)
Established Patient Office Visit  Subjective:  Patient ID: Briana [name], female    DOB: 11/21/1965  Age: 57 y.o. MRN: [id number]  Chief Complaint  Patient presents with   Follow-up    3 months follow up    Patient here today for a follow up, lab results and refill request on Dayvigo, approved by her current pain mgmt provider.  She is no longer sleeping with trazodone.  She also is inquiring about her immunizations, ensuring her Tdap is up to date.  Will be due again in June 2025.      No other concerns at this time.   Past Medical History:  Diagnosis Date   Abnormal Pap smear of cervix    01/2015 ascus/neg- 04/2015 ascus/neg   Allergy    Anxiety    Arthritis    Asthma    Breast cancer (HCC) 2019   Chronic kidney disease    COPD (chronic obstructive pulmonary disease) (HCC)    DDD (degenerative disc disease), lumbar    Depression    Elevated lipids    Fibromyalgia    Fibromyalgia    Genetic testing 03/09/2017   Multi-Cancer panel (83 genes) @ [facility] - No pathogenic mutations detected   GERD (gastroesophageal reflux disease)    History of IBS    Hyperthyroidism    Joint disease    Migraine    Migraine    Oxygen deficiency    Personal history of chemotherapy    Personal history of radiation therapy    Restless leg syndrome    Sleep apnea    Does not use C-PAP, cannot tolerate mask    Past Surgical History:  Procedure Laterality Date   ABDOMINAL HYSTERECTOMY  2008   BREAST BIOPSY Left 02/02/2017   [place] core positive   BREAST LUMPECTOMY Left 02/17/2017   BREAST LUMPECTOMY WITH NEEDLE LOCALIZATION Left 02/17/2017   Procedure: BREAST LUMPECTOMY WITH NEEDLE LOCALIZATION;  Surgeon: Ancil Linsey, MD;  Location: [facility];  Service: General;  Laterality: Left;   CARPAL TUNNEL RELEASE Bilateral    cryotherapy     DILATION AND CURETTAGE OF UTERUS     ENDOMETRIAL ABLATION     LAPAROSCOPIC OOPHERECTOMY Left    unsure which side but thinks its the left   LYSIS OF  ADHESION Left 10/14/2015   Procedure: LYSIS OF ADHESION;  Surgeon: Juanell Fairly, MD;  Location: [facility];  Service: Orthopedics;  Laterality: Left;   NECK SURGERY     lower neck fusion rods and screws   OOPHORECTOMY     one ovary removed    PORTA CATH INSERTION N/A 03/08/2017   Procedure: PORTA CATH INSERTION;  Surgeon: Renford Dills, MD;  Location: [facility];  Service: Cardiovascular;  Laterality: N/A;   RESECTION DISTAL CLAVICAL Left 10/14/2015   Procedure: RESECTION DISTAL CLAVICAL;  Surgeon: Juanell Fairly, MD;  Location: [facility];  Service: Orthopedics;  Laterality: Left;   SENTINEL NODE BIOPSY Left 02/17/2017   Procedure: SENTINEL NODE BIOPSY;  Surgeon: Ancil Linsey, MD;  Location: [facility];  Service: General;  Laterality: Left;   SHOULDER ARTHROSCOPY WITH OPEN ROTATOR CUFF REPAIR Left 10/14/2015   Procedure: SHOULDER ARTHROSCOPY WITH OPEN ROTATOR CUFF REPAIR;  Surgeon: Juanell Fairly, MD;  Location: [facility];  Service: Orthopedics;  Laterality: Left;   SHOULDER ARTHROSCOPY WITH OPEN ROTATOR CUFF REPAIR AND DISTAL CLAVICLE ACROMINECTOMY Right 09/03/2014   Procedure: RIGHT SHOULDER ARTHROSCOPY WITH MINI OPEN ROTATOR CUFF TEAR;  Surgeon: Juanell Fairly, MD;  Location: [facility];  Service: Orthopedics;  Laterality: Right;  biceps tenodesis, arthroscopic subacromial decompression and distal clavicle incision   spinal injections     SUBACROMIAL DECOMPRESSION Left 10/14/2015   Procedure: SUBACROMIAL DECOMPRESSION;  Surgeon: Juanell Fairly, MD;  Location: [facility];  Service: Orthopedics;  Laterality: Left;   TEE WITHOUT CARDIOVERSION N/A 02/22/2021   Procedure: TRANSESOPHAGEAL ECHOCARDIOGRAM (TEE);  Surgeon: Lamar Blinks, MD;  Location: [facility];  Service: Cardiovascular;  Laterality: N/A;    Social History   Socioeconomic History   Marital status: Divorced    Spouse name: Not on file   Number of children: 1   Years of education: Not on file   Highest education  level: High school graduate  Occupational History    Comment: disabled  Tobacco Use   Smoking status: Every Day    Packs/day: 1    Types: Cigarettes   Smokeless tobacco: Never  Vaping Use   Vaping Use: Never used  Substance and Sexual Activity   Alcohol use: No    Alcohol/week: 0.0 standard drinks of alcohol   Drug use: No   Sexual activity: Yes  Other Topics Concern   Not on file  Social History Narrative   Not on file   Social Determinants of Health   Financial Resource Strain: High Risk (04/26/2017)   Overall Financial Resource Strain (CARDIA)    Difficulty of Paying Living Expenses: Very hard  Food Insecurity: Food Insecurity Present (04/26/2017)   Hunger Vital Sign    Worried About Running Out of Food in the Last Year: Often true    Ran Out of Food in the Last Year: Often true  Transportation Needs: No Transportation Needs (04/26/2017)   PRAPARE - Administrator, Civil Service (Medical): No    Lack of Transportation (Non-Medical): No  Physical Activity: Inactive (04/26/2017)   Exercise Vital Sign    Days of Exercise per Week: 0 days    Minutes of Exercise per Session: 0 min  Stress: Stress Concern Present (04/26/2017)   [employer] of Occupational Health - Occupational Stress Questionnaire    Feeling of Stress : Very much  Social Connections: Unknown (04/26/2017)   Social Connection and Isolation Panel [NHANES]    Frequency of Communication with Friends and Family: Not on file    Frequency of Social Gatherings with Friends and Family: Not on file    Attends Religious Services: Never    Active Member of Clubs or Organizations: No    Attends Banker Meetings: Never    Marital Status: Divorced  Catering manager Violence: Not At Risk (04/26/2017)   Humiliation, Afraid, Rape, and Kick questionnaire    Fear of Current or Ex-Partner: No    Emotionally Abused: No    Physically Abused: No    Sexually Abused: No    Family History  Problem  Relation Age of Onset   Breast cancer Mother 20       Glioblastoma also; deceased at 52   Diabetes Father    Lung cancer Father        mesothelioma; deceased 59s   Cancer Maternal Aunt        "bone ca"; unk. primary   Colon cancer Maternal Grandfather        dx in 61s; deceased 82   Colon cancer Maternal Aunt        dx in 77s; currently 49s   Lung cancer Maternal Grandmother        smoker; deceased 37  Lung cancer Paternal Grandmother        smoker; deceased 63s   Breast cancer Cousin        dx 36s; daughter of mat aunt with unk. primary cancer   Cancer Other        distant cousin; unknown primary   Colon cancer Other        dx 72s; currently 54; maternal half-sister   Bipolar disorder Sister    Schizophrenia Sister     Allergies  Allergen Reactions   Iodine    Bee Venom Hives and Rash   Cefoxitin Rash   Cefuroxime Axetil Hives and Rash   Cucumber Extract Rash   Gadolinium Derivatives Swelling, Other (See Comments) and Rash    NECK BECAME RED AND TONGUE WAS SWELLING SLIGHTLY PER PT NECK BECAME RED AND TONGUE WAS SWELLING SLIGHTLY PER PT NECK BECAME RED AND TONGUE WAS SWELLING SLIGHTLY PER PT   Iodinated Contrast Media Hives, Rash and Other (See Comments)   Latex Rash   Other Hives and Rash    Bee Stings-swelling/hives/rash Wool (textile fiber)-Rash/itching   Tomato Rash and Other (See Comments)    Red tomatoes    Review of Systems  Constitutional: Negative.   HENT: Negative.    Eyes: Negative.   Respiratory: Negative.    Cardiovascular: Negative.   Gastrointestinal: Negative.   Genitourinary: Negative.   Musculoskeletal: Negative.   Skin: Negative.   Neurological: Negative.   Endo/Heme/Allergies: Negative.   Psychiatric/Behavioral:  The patient has insomnia.        Objective:   BP (!) 100/55   Pulse (!) 115   Ht 5\' 3"  (1.6 m)   Wt 162 lb 3.2 oz (73.6 kg)   LMP 08/24/2008   SpO2 97%   BMI 28.73 kg/m   Vitals:   07/19/22 1507  BP: (!) 100/55   Pulse: (!) 115  Height: 5\' 3"  (1.6 m)  Weight: 162 lb 3.2 oz (73.6 kg)  SpO2: 97%  BMI (Calculated): 28.74    Physical Exam Vitals and nursing note reviewed.  Constitutional:      Appearance: Normal appearance.  HENT:     Head: Normocephalic.     Nose: Nose normal.     Mouth/Throat:     Mouth: Mucous membranes are moist.  Eyes:     Pupils: Pupils are equal, round, and reactive to light.  Cardiovascular:     Rate and Rhythm: Normal rate and regular rhythm.  Pulmonary:     Effort: Pulmonary effort is normal.     Breath sounds: Normal breath sounds.  Abdominal:     General: Bowel sounds are normal.     Palpations: Abdomen is soft.  Musculoskeletal:        General: Normal range of motion.     Cervical back: Normal range of motion and neck supple.  Skin:    General: Skin is warm and dry.  Neurological:     Mental Status: She is alert and oriented to person, place, and time.  Psychiatric:        Mood and Affect: Mood normal.        Behavior: Behavior normal.      No results found for any visits on 07/19/22.  Recent Results (from the past 2160 hour(s))  Cytology - PAP     Status: None   Collection Time: 06/28/22  2:35 PM  Result Value Ref Range   High risk HPV Negative    Adequacy      Satisfactory for  evaluation; transformation zone component PRESENT.   Diagnosis      - Negative for intraepithelial lesion or malignancy (NILM)   Comment Atrophic changes are present.    Comment Normal Reference Range HPV - Negative   Hemoglobin A1c     Status: Abnormal   Collection Time: 07/11/22 12:26 PM  Result Value Ref Range   Hgb A1c MFr Bld 5.9 (H) 4.8 - 5.6 %    Comment:          Prediabetes: 5.7 - 6.4          Diabetes: >6.4          Glycemic control for adults with diabetes: <7.0    Est. average glucose Bld gHb Est-mCnc 123 mg/dL  TSH     Status: None   Collection Time: 07/11/22 12:26 PM  Result Value Ref Range   TSH 1.270 0.450 - 4.500 uIU/mL  CMP14+EGFR      Status: Abnormal   Collection Time: 07/11/22 12:26 PM  Result Value Ref Range   Glucose 98 70 - 99 mg/dL   BUN 18 6 - 24 mg/dL   Creatinine, Ser 9.81 (H) 0.57 - 1.00 mg/dL   eGFR 52 (L) >19 JY/NWG/9.56   BUN/Creatinine Ratio 15 9 - 23   Sodium 140 134 - 144 mmol/L   Potassium 4.7 3.5 - 5.2 mmol/L   Chloride 106 96 - 106 mmol/L   CO2 20 20 - 29 mmol/L   Calcium 9.7 8.7 - 10.2 mg/dL   Total Protein 6.5 6.0 - 8.5 g/dL   Albumin 4.1 3.8 - 4.9 g/dL   Globulin, Total 2.4 1.5 - 4.5 g/dL   Albumin/Globulin Ratio 1.7    Bilirubin Total 0.2 0.0 - 1.2 mg/dL   Alkaline Phosphatase 99 44 - 121 IU/L   AST 10 0 - 40 IU/L   ALT 6 0 - 32 IU/L  Lipid panel     Status: None   Collection Time: 07/11/22 12:26 PM  Result Value Ref Range   Cholesterol, Total 111 100 - 199 mg/dL   Triglycerides 213 0 - 149 mg/dL   HDL 45 >08 mg/dL   VLDL Cholesterol Cal 24 5 - 40 mg/dL   LDL Chol Calc (NIH) 42 0 - 99 mg/dL   LDL CALC COMMENT: CANCELED     Comment: Test not performed  Result canceled by the ancillary.    Chol/HDL Ratio 2.5 0.0 - 4.4 ratio    Comment:                                   T. Chol/HDL Ratio                                             Men  Women                               1/2 Avg.Risk  3.4    3.3                                   Avg.Risk  5.0    4.4  2X Avg.Risk  9.6    7.1                                3X Avg.Risk 23.4   11.0   CBC With Differential     Status: Abnormal   Collection Time: 07/11/22 12:26 PM  Result Value Ref Range   WBC 9.1 3.4 - 10.8 x10E3/uL   RBC 4.39 3.77 - 5.28 x10E6/uL   Hemoglobin 14.4 11.1 - 15.9 g/dL   Hematocrit 40.9 81.1 - 46.6 %   MCV 98 (H) 79 - 97 fL   MCH 32.8 26.6 - 33.0 pg   MCHC 33.5 31.5 - 35.7 g/dL   RDW 91.4 78.2 - 95.6 %   Neutrophils 76 Not Estab. %   Lymphs 17 Not Estab. %   Monocytes 4 Not Estab. %   Eos 2 Not Estab. %   Basos 1 Not Estab. %   Neutrophils Absolute 6.9 1.4 - 7.0 x10E3/uL    Lymphocytes Absolute 1.5 0.7 - 3.1 x10E3/uL   Monocytes Absolute 0.4 0.1 - 0.9 x10E3/uL   EOS (ABSOLUTE) 0.2 0.0 - 0.4 x10E3/uL   Basophils Absolute 0.1 0.0 - 0.2 x10E3/uL   Immature Granulocytes 0 Not Estab. %   Immature Grans (Abs) 0.0 0.0 - 0.1 x10E3/uL    Comment: **Effective August 29, 2022, profile [id number] CBC/Differential**   (No Platelet) will be made non-orderable. [facility] Offers:   [id number] CBC With Differential/Platelet       Assessment & Plan:  1) Pt requests Dayvigo script for sleep, trazodone ineffective and davigo has been approved by pain mgmt provider 2) Follow up appt in 6 months, fasting labs prior Problem List Items Addressed This Visit       Other   HLD (hyperlipidemia)   Insomnia - Primary   Prediabetes    Return for Pt would like to schedule her annual wellness exam for her next appt, before December.   Total time spent: 35 minutes  Orson Eva, NP  07/19/2022   This document may have been prepared by [facility] Voice Recognition software and as such may include unintentional dictation errors.

## 2022-07-19 NOTE — Patient Instructions (Signed)
1) Pt requests Dayvigo script for sleep, trazodone ineffective and davigo has been approved by pain mgmt provider 2) Follow up appt in 6 months, fasting labs prior

## 2022-07-27 ENCOUNTER — Encounter: Payer: Self-pay | Admitting: Nurse Practitioner

## 2022-08-02 DIAGNOSIS — J449 Chronic obstructive pulmonary disease, unspecified: Secondary | ICD-10-CM | POA: Diagnosis not present

## 2022-08-15 DIAGNOSIS — M5136 Other intervertebral disc degeneration, lumbar region: Secondary | ICD-10-CM | POA: Diagnosis not present

## 2022-08-15 DIAGNOSIS — M179 Osteoarthritis of knee, unspecified: Secondary | ICD-10-CM | POA: Diagnosis not present

## 2022-08-15 DIAGNOSIS — G894 Chronic pain syndrome: Secondary | ICD-10-CM | POA: Diagnosis not present

## 2022-08-15 DIAGNOSIS — M5134 Other intervertebral disc degeneration, thoracic region: Secondary | ICD-10-CM | POA: Diagnosis not present

## 2022-08-15 DIAGNOSIS — M62830 Muscle spasm of back: Secondary | ICD-10-CM | POA: Diagnosis not present

## 2022-09-02 DIAGNOSIS — J449 Chronic obstructive pulmonary disease, unspecified: Secondary | ICD-10-CM | POA: Diagnosis not present

## 2022-09-07 ENCOUNTER — Other Ambulatory Visit: Payer: Self-pay

## 2022-09-07 ENCOUNTER — Other Ambulatory Visit (HOSPITAL_COMMUNITY): Payer: Self-pay

## 2022-09-07 ENCOUNTER — Other Ambulatory Visit: Payer: Self-pay | Admitting: Nurse Practitioner

## 2022-09-07 DIAGNOSIS — L709 Acne, unspecified: Secondary | ICD-10-CM

## 2022-09-08 ENCOUNTER — Other Ambulatory Visit: Payer: Self-pay | Admitting: Nurse Practitioner

## 2022-09-08 ENCOUNTER — Other Ambulatory Visit: Payer: Self-pay

## 2022-09-08 ENCOUNTER — Other Ambulatory Visit (HOSPITAL_COMMUNITY): Payer: Self-pay

## 2022-09-08 MED ORDER — PROMETHAZINE HCL 25 MG PO TABS
25.0000 mg | ORAL_TABLET | Freq: Four times a day (QID) | ORAL | 0 refills | Status: DC | PRN
  Filled 2022-09-08: qty 30, 8d supply, fill #0

## 2022-09-08 MED ORDER — BENZOYL PEROXIDE-ERYTHROMYCIN 5-3 % EX GEL: CUTANEOUS | 0 refills | Status: DC

## 2022-09-13 DIAGNOSIS — Z79891 Long term (current) use of opiate analgesic: Secondary | ICD-10-CM | POA: Diagnosis not present

## 2022-09-13 DIAGNOSIS — M19019 Primary osteoarthritis, unspecified shoulder: Secondary | ICD-10-CM | POA: Diagnosis not present

## 2022-09-13 DIAGNOSIS — M5136 Other intervertebral disc degeneration, lumbar region: Secondary | ICD-10-CM | POA: Diagnosis not present

## 2022-09-13 DIAGNOSIS — M5134 Other intervertebral disc degeneration, thoracic region: Secondary | ICD-10-CM | POA: Diagnosis not present

## 2022-09-13 DIAGNOSIS — G894 Chronic pain syndrome: Secondary | ICD-10-CM | POA: Diagnosis not present

## 2022-09-13 DIAGNOSIS — M542 Cervicalgia: Secondary | ICD-10-CM | POA: Diagnosis not present

## 2022-09-13 DIAGNOSIS — Z5181 Encounter for therapeutic drug level monitoring: Secondary | ICD-10-CM | POA: Diagnosis not present

## 2022-09-13 DIAGNOSIS — M9953 Intervertebral disc stenosis of neural canal of lumbar region: Secondary | ICD-10-CM | POA: Diagnosis not present

## 2022-09-13 DIAGNOSIS — M179 Osteoarthritis of knee, unspecified: Secondary | ICD-10-CM | POA: Diagnosis not present

## 2022-09-13 DIAGNOSIS — M5459 Other low back pain: Secondary | ICD-10-CM | POA: Diagnosis not present

## 2022-09-13 DIAGNOSIS — R202 Paresthesia of skin: Secondary | ICD-10-CM | POA: Diagnosis not present

## 2022-09-13 DIAGNOSIS — M25512 Pain in left shoulder: Secondary | ICD-10-CM | POA: Diagnosis not present

## 2022-09-23 DIAGNOSIS — Z5181 Encounter for therapeutic drug level monitoring: Secondary | ICD-10-CM | POA: Diagnosis not present

## 2022-09-23 DIAGNOSIS — G894 Chronic pain syndrome: Secondary | ICD-10-CM | POA: Diagnosis not present

## 2022-09-23 DIAGNOSIS — Z79891 Long term (current) use of opiate analgesic: Secondary | ICD-10-CM | POA: Diagnosis not present

## 2022-09-23 DIAGNOSIS — M5459 Other low back pain: Secondary | ICD-10-CM | POA: Diagnosis not present

## 2022-10-03 DIAGNOSIS — J449 Chronic obstructive pulmonary disease, unspecified: Secondary | ICD-10-CM | POA: Diagnosis not present

## 2022-10-04 DIAGNOSIS — L738 Other specified follicular disorders: Secondary | ICD-10-CM | POA: Diagnosis not present

## 2022-10-04 DIAGNOSIS — L218 Other seborrheic dermatitis: Secondary | ICD-10-CM | POA: Diagnosis not present

## 2022-10-04 DIAGNOSIS — Z86018 Personal history of other benign neoplasm: Secondary | ICD-10-CM | POA: Diagnosis not present

## 2022-10-04 DIAGNOSIS — L700 Acne vulgaris: Secondary | ICD-10-CM | POA: Diagnosis not present

## 2022-10-04 DIAGNOSIS — L578 Other skin changes due to chronic exposure to nonionizing radiation: Secondary | ICD-10-CM | POA: Diagnosis not present

## 2022-10-13 DIAGNOSIS — M5459 Other low back pain: Secondary | ICD-10-CM | POA: Diagnosis not present

## 2022-10-13 DIAGNOSIS — G894 Chronic pain syndrome: Secondary | ICD-10-CM | POA: Diagnosis not present

## 2022-10-13 DIAGNOSIS — Z5181 Encounter for therapeutic drug level monitoring: Secondary | ICD-10-CM | POA: Diagnosis not present

## 2022-10-13 DIAGNOSIS — Z79891 Long term (current) use of opiate analgesic: Secondary | ICD-10-CM | POA: Diagnosis not present

## 2022-10-20 ENCOUNTER — Ambulatory Visit (INDEPENDENT_AMBULATORY_CARE_PROVIDER_SITE_OTHER): Payer: 59 | Admitting: Cardiology

## 2022-10-20 ENCOUNTER — Encounter: Payer: Self-pay | Admitting: Cardiology

## 2022-10-20 VITALS — BP 124/78 | HR 96 | Ht 63.0 in | Wt 155.0 lb

## 2022-10-20 DIAGNOSIS — E782 Mixed hyperlipidemia: Secondary | ICD-10-CM

## 2022-10-20 DIAGNOSIS — R7303 Prediabetes: Secondary | ICD-10-CM | POA: Diagnosis not present

## 2022-10-20 DIAGNOSIS — R1030 Lower abdominal pain, unspecified: Secondary | ICD-10-CM

## 2022-10-20 DIAGNOSIS — I10 Essential (primary) hypertension: Secondary | ICD-10-CM

## 2022-10-20 DIAGNOSIS — Z0001 Encounter for general adult medical examination with abnormal findings: Secondary | ICD-10-CM

## 2022-10-20 LAB — POCT URINALYSIS DIPSTICK
Bilirubin, UA: NEGATIVE
Glucose, UA: NEGATIVE
Ketones, UA: NEGATIVE
Nitrite, UA: NEGATIVE
Protein, UA: POSITIVE — AB
Spec Grav, UA: 1.02 (ref 1.010–1.025)
Urobilinogen, UA: 0.2 E.U./dL
pH, UA: 7 (ref 5.0–8.0)

## 2022-10-20 MED ORDER — NITROFURANTOIN MONOHYD MACRO 100 MG PO CAPS: 100.0000 mg | ORAL_CAPSULE | Freq: Two times a day (BID) | ORAL | 0 refills | Status: AC

## 2022-10-20 MED ORDER — VALACYCLOVIR HCL 1 G PO TABS: 1000.0000 mg | ORAL_TABLET | Freq: Three times a day (TID) | ORAL | 3 refills | Status: AC

## 2022-10-20 NOTE — Progress Notes (Signed)
Complete physical exam  Patient: Briana [name]   DOB: [birth year]   57 y.o. Female  MRN: [id number]  Subjective:    Chief Complaint  Patient presents with   Annual Exam    CPE, 3 month follow up.    Briana ESPEJEL is a 57 y.o. female who presents today for a complete physical exam. She reports consuming a general diet. The patient does not participate in regular exercise at present. She generally feels fairly well. She reports sleeping poorly. She does have additional problems to discuss today.   Complaining of lower pelvic pain, urine shows trace leukocytes, will send urine for culture. Macrobid sent to the pharmacy.   Patient complaining of an outbreak of shingles on her coccyx. Will send in valacyclovir.   Patient requesting a refill on her Dayvigo. Briana [name] was approved for use by previous pain management doctor. Patient has a new pain management doctor, she will call them to get approval. Briana [name] was not approved by insurance when prescribed in June 2024. Patient has tried and failed trazodone 150 mg, melatonin, magnesium. Patient has taken Dayvigo in the past, started on 5 mg at bedtime, increased to 10 mg.   Most recent fall risk assessment:    09/26/2018   10:50 AM  Fall Risk   Falls in the past year? 0     Most recent depression screenings:    05/31/2017   10:02 AM 10/12/2015   12:58 PM  PHQ 2/9 Scores  PHQ - 2 Score 0 0    Vision:Within last year and Dental: No current dental problems  Past Medical History:  Diagnosis Date   Abnormal Pap smear of cervix    01/2015 ascus/neg- 04/2015 ascus/neg   Allergy    Anxiety    Arthritis    Asthma    Breast cancer (HCC) 2019   Chronic kidney disease    COPD (chronic obstructive pulmonary disease) (HCC)    DDD (degenerative disc disease), lumbar    Depression    Elevated lipids    Fibromyalgia    Fibromyalgia    Genetic testing 03/09/2017   Multi-Cancer panel (83 genes) @ [facility] - No pathogenic mutations detected   GERD  (gastroesophageal reflux disease)    History of IBS    Hyperthyroidism    Joint disease    Migraine    Migraine    Oxygen deficiency    Personal history of chemotherapy    Personal history of radiation therapy    Restless leg syndrome    Sleep apnea    Does not use C-PAP, cannot tolerate mask    Past Surgical History:  Procedure Laterality Date   ABDOMINAL HYSTERECTOMY  2008   BREAST BIOPSY Left 02/02/2017   [place] core positive   BREAST LUMPECTOMY Left 02/17/2017   BREAST LUMPECTOMY WITH NEEDLE LOCALIZATION Left 02/17/2017   Procedure: BREAST LUMPECTOMY WITH NEEDLE LOCALIZATION;  Surgeon: Ancil Linsey, MD;  Location: [facility];  Service: General;  Laterality: Left;   CARPAL TUNNEL RELEASE Bilateral    cryotherapy     DILATION AND CURETTAGE OF UTERUS     ENDOMETRIAL ABLATION     LAPAROSCOPIC OOPHERECTOMY Left    unsure which side but thinks its the left   LYSIS OF ADHESION Left 10/14/2015   Procedure: LYSIS OF ADHESION;  Surgeon: Juanell Fairly, MD;  Location: [facility];  Service: Orthopedics;  Laterality: Left;   NECK SURGERY     lower neck fusion rods and screws   OOPHORECTOMY  one ovary removed    PORTA CATH INSERTION N/A 03/08/2017   Procedure: PORTA CATH INSERTION;  Surgeon: Renford Dills, MD;  Location: [facility];  Service: Cardiovascular;  Laterality: N/A;   RESECTION DISTAL CLAVICAL Left 10/14/2015   Procedure: RESECTION DISTAL CLAVICAL;  Surgeon: Juanell Fairly, MD;  Location: [facility];  Service: Orthopedics;  Laterality: Left;   SENTINEL NODE BIOPSY Left 02/17/2017   Procedure: SENTINEL NODE BIOPSY;  Surgeon: Ancil Linsey, MD;  Location: [facility];  Service: General;  Laterality: Left;   SHOULDER ARTHROSCOPY WITH OPEN ROTATOR CUFF REPAIR Left 10/14/2015   Procedure: SHOULDER ARTHROSCOPY WITH OPEN ROTATOR CUFF REPAIR;  Surgeon: Juanell Fairly, MD;  Location: [facility];  Service: Orthopedics;  Laterality: Left;   SHOULDER ARTHROSCOPY WITH OPEN  ROTATOR CUFF REPAIR AND DISTAL CLAVICLE ACROMINECTOMY Right 09/03/2014   Procedure: RIGHT SHOULDER ARTHROSCOPY WITH MINI OPEN ROTATOR CUFF TEAR;  Surgeon: Juanell Fairly, MD;  Location: [facility];  Service: Orthopedics;  Laterality: Right;  biceps tenodesis, arthroscopic subacromial decompression and distal clavicle incision   spinal injections     SUBACROMIAL DECOMPRESSION Left 10/14/2015   Procedure: SUBACROMIAL DECOMPRESSION;  Surgeon: Juanell Fairly, MD;  Location: [facility];  Service: Orthopedics;  Laterality: Left;   TEE WITHOUT CARDIOVERSION N/A 02/22/2021   Procedure: TRANSESOPHAGEAL ECHOCARDIOGRAM (TEE);  Surgeon: Lamar Blinks, MD;  Location: [facility];  Service: Cardiovascular;  Laterality: N/A;    Family History  Problem Relation Age of Onset   Breast cancer Mother 66       Glioblastoma also; deceased at 32   Diabetes Father    Lung cancer Father        mesothelioma; deceased 88s   Cancer Maternal Aunt        "bone ca"; unk. primary   Colon cancer Maternal Grandfather        dx in 43s; deceased 71   Colon cancer Maternal Aunt        dx in 22s; currently 51s   Lung cancer Maternal Grandmother        smoker; deceased 32   Lung cancer Paternal Grandmother        smoker; deceased 3s   Breast cancer Cousin        dx 42s; daughter of mat aunt with unk. primary cancer   Cancer Other        distant cousin; unknown primary   Colon cancer Other        dx 52s; currently 68; maternal half-sister   Bipolar disorder Sister    Schizophrenia Sister     Social History   Socioeconomic History   Marital status: Divorced    Spouse name: Not on file   Number of children: 1   Years of education: Not on file   Highest education level: High school graduate  Occupational History    Comment: disabled  Tobacco Use   Smoking status: Every Day    Current packs/day: 1.00    Types: Cigarettes   Smokeless tobacco: Never  Vaping Use   Vaping status: Never Used  Substance and Sexual  Activity   Alcohol use: No    Alcohol/week: 0.0 standard drinks of alcohol   Drug use: No   Sexual activity: Yes  Other Topics Concern   Not on file  Social History Narrative   Not on file   Social Determinants of Health   Financial Resource Strain: High Risk (04/26/2017)   Overall Financial Resource Strain (CARDIA)    Difficulty of  Paying Living Expenses: Very hard  Food Insecurity: Food Insecurity Present (04/26/2017)   Hunger Vital Sign    Worried About Running Out of Food in the Last Year: Often true    Ran Out of Food in the Last Year: Often true  Transportation Needs: No Transportation Needs (04/26/2017)   PRAPARE - Administrator, Civil Service (Medical): No    Lack of Transportation (Non-Medical): No  Physical Activity: Inactive (04/26/2017)   Exercise Vital Sign    Days of Exercise per Week: 0 days    Minutes of Exercise per Session: 0 min  Stress: Stress Concern Present (04/26/2017)   [employer] of Occupational Health - Occupational Stress Questionnaire    Feeling of Stress : Very much  Social Connections: Unknown (04/26/2017)   Social Connection and Isolation Panel [NHANES]    Frequency of Communication with Friends and Family: Not on file    Frequency of Social Gatherings with Friends and Family: Not on file    Attends Religious Services: Never    Active Member of Clubs or Organizations: No    Attends Banker Meetings: Never    Marital Status: Divorced  Catering manager Violence: Not At Risk (04/26/2017)   Humiliation, Afraid, Rape, and Kick questionnaire    Fear of Current or Ex-Partner: No    Emotionally Abused: No    Physically Abused: No    Sexually Abused: No    Outpatient Medications Prior to Visit  Medication Sig   albuterol (VENTOLIN HFA) 108 (90 Base) MCG/ACT inhaler Inhale 1-2 puffs into the lungs every 6 (six) hours as needed for wheezing or shortness of breath.   baclofen (LIORESAL) 10 MG tablet Take 10 mg by mouth 3  (three) times daily as needed for muscle spasms (typically 2-3 times daily).    benzoyl peroxide-erythromycin (BENZAMYCIN) gel Apply to affected area 2 times daily   calcium carbonate (OS-CAL) 600 MG TABS tablet Take by mouth.   cholecalciferol (VITAMIN D) 1000 UNITS tablet Take 1,000 Units by mouth 2 (two) times daily.    clopidogrel (PLAVIX) 75 MG tablet Take 75 mg by mouth daily.   EPINEPHrine 0.3 mg/0.3 mL IJ SOAJ injection INJECT INTO THE MIDDLE OF THE OUTER THIGH AND HOLD FOR 10 SECONDS AS NEEDED FOR SEVERE ALLERGIC REACTION THEN CALL 911 IF USED   Multiple Vitamins-Minerals (HAIR SKIN AND NAILS FORMULA) TABS Take 2 tablets by mouth daily.   OXYCODONE HCL PO Take 15 mg by mouth as needed. Take 4-6 times a day as needed.   phytonadione (VITAMIN K) 2 MG/ML SOLN oral solution Take 1 mg by mouth once.   pregabalin (LYRICA) 25 MG capsule Take 1 capsule by mouth 2 (two) times daily.   promethazine (PHENERGAN) 25 MG tablet Take 1 tablet (25 mg total) by mouth every 6 (six) hours as needed for nausea or vomiting.   rizatriptan (MAXALT-MLT) 10 MG disintegrating tablet Take 10 mg by mouth as needed for migraine. May repeat in 2 hours if needed   rOPINIRole (REQUIP) 1 MG tablet Take 1 mg by mouth at bedtime.   rosuvastatin (CRESTOR) 40 MG tablet Take 40 mg by mouth at bedtime.    topiramate (TOPAMAX) 100 MG tablet Take 200 mg by mouth daily.    trazodone (DESYREL) 300 MG tablet Take 1 tablet (300 mg total) by mouth at bedtime.   vitamin E 200 UNIT capsule Take 200 Units by mouth daily.   Lemborexant (DAYVIGO) 10 MG TABS Take 1 tablet (10 mg  total) by mouth at bedtime. (Patient not taking: Reported on 10/20/2022)   NARCAN 4 MG/0.1ML LIQD nasal spray kit  (Patient not taking: Reported on 10/20/2022)   [DISCONTINUED] albuterol (VENTOLIN HFA) 108 (90 Base) MCG/ACT inhaler Inhale 1-2 puffs into the lungs every 4 (four) hours as needed for wheezing or shortness of breath. (Patient not taking: Reported on  10/20/2022)   [DISCONTINUED] clindamycin-benzoyl peroxide (BENZACLIN) gel SMARTSIG:Sparingly Topical Twice Daily (Patient not taking: Reported on 07/05/2022)   [DISCONTINUED] heparin lock flush 100 unit/mL    [DISCONTINUED] sodium chloride flush (NS) 0.9 % injection 10 mL    No facility-administered medications prior to visit.    Review of Systems  Constitutional: Negative.   HENT: Negative.    Eyes: Negative.   Respiratory: Negative.  Negative for shortness of breath.   Cardiovascular: Negative.  Negative for chest pain.  Gastrointestinal: Negative.  Negative for abdominal pain, constipation and diarrhea.  Genitourinary: Negative.  Negative for dysuria, frequency and urgency.       Pelvic pain  Musculoskeletal:  Negative for joint pain and myalgias.  Skin: Negative.   Neurological: Negative.  Negative for dizziness and headaches.  Endo/Heme/Allergies: Negative.   All other systems reviewed and are negative.       Objective:     BP 124/78   Pulse 96   Ht 5\' 3"  (1.6 m)   Wt 155 lb (70.3 kg)   LMP 08/24/2008   SpO2 97%   BMI 27.46 kg/m  BP Readings from Last 3 Encounters:  10/20/22 124/78  07/19/22 (!) 100/55  06/28/22 (!) 89/75   Wt Readings from Last 3 Encounters:  10/20/22 155 lb (70.3 kg)  07/19/22 162 lb 3.2 oz (73.6 kg)  06/28/22 154 lb 9.6 oz (70.1 kg)      Physical Exam Vitals and nursing note reviewed.  Constitutional:      Appearance: Normal appearance. She is normal weight.  HENT:     Head: Normocephalic and atraumatic.     Nose: Nose normal.     Mouth/Throat:     Mouth: Mucous membranes are moist.  Eyes:     Extraocular Movements: Extraocular movements intact.     Conjunctiva/sclera: Conjunctivae normal.     Pupils: Pupils are equal, round, and reactive to light.  Cardiovascular:     Rate and Rhythm: Normal rate and regular rhythm.     Pulses: Normal pulses.     Heart sounds: Normal heart sounds.  Pulmonary:     Effort: Pulmonary effort is  normal.     Breath sounds: Normal breath sounds.  Abdominal:     General: Abdomen is flat. Bowel sounds are normal.     Palpations: Abdomen is soft.  Musculoskeletal:        General: Normal range of motion.     Cervical back: Normal range of motion.  Skin:    General: Skin is warm and dry.  Neurological:     General: No focal deficit present.     Mental Status: She is alert and oriented to person, place, and time.  Psychiatric:        Mood and Affect: Mood normal.        Behavior: Behavior normal.        Thought Content: Thought content normal.        Judgment: Judgment normal.      No results found for any visits on 10/20/22.  No results found for this or any previous visit (from the past 2160 hour(s)).  Assessment & Plan:    Routine Health Maintenance and Physical Exam  Immunization History  Administered Date(s) Administered   Moderna Covid-19 Vaccine Bivalent Booster 72yrs & up 11/29/2020   Moderna Sars-Covid-2 Vaccination 04/22/2019, 05/20/2019, 10/17/2019, 05/12/2020    Health Maintenance  Topic Date Due   Medicare Annual Wellness (AWV)  Never done   Hepatitis C Screening  Never done   DTaP/Tdap/Td (1 - Tdap) Never done   Zoster Vaccines- Shingrix (1 of 2) Never done   INFLUENZA VACCINE  Never done   COVID-19 Vaccine (6 - 2023-24 season) 10/02/2022   Colonoscopy  05/14/2023   MAMMOGRAM  04/27/2024   HIV Screening  Completed   HPV VACCINES  Aged Out    Discussed health benefits of physical activity, and encouraged her to engage in regular exercise appropriate for her age and condition.  Problem List Items Addressed This Visit       Other   HLD (hyperlipidemia) - Primary   Relevant Orders   Lipid Profile   CMP14+EGFR   TSH   CBC with Differential/Platelet   Prediabetes   Relevant Orders   Hemoglobin A1c   TSH   CBC with Differential/Platelet   Other Visit Diagnoses     Lower abdominal pain          Return in about 4 months (around  02/19/2023) for with fasting labs today.     Marisue Ivan, NP  10/20/2022   This document may have been prepared by Dragon Voice Recognition software and as such may include unintentional dictation errors.

## 2022-10-21 ENCOUNTER — Other Ambulatory Visit: Payer: Self-pay

## 2022-10-21 LAB — CMP14+EGFR
ALT: 9 IU/L (ref 0–32)
AST: 14 IU/L (ref 0–40)
Albumin: 4.2 g/dL (ref 3.8–4.9)
Alkaline Phosphatase: 87 IU/L (ref 44–121)
BUN/Creatinine Ratio: 13 (ref 9–23)
BUN: 19 mg/dL (ref 6–24)
Bilirubin Total: <0.2 mg/dL (ref 0.0–1.2)
CO2: 19 mmol/L — ABNORMAL LOW (ref 20–29)
Calcium: 9.6 mg/dL (ref 8.7–10.2)
Chloride: 104 mmol/L (ref 96–106)
Creatinine, Ser: 1.43 mg/dL — ABNORMAL HIGH (ref 0.57–1.00)
Globulin, Total: 2.7 g/dL (ref 1.5–4.5)
Glucose: 88 mg/dL (ref 70–99)
Potassium: 5.1 mmol/L (ref 3.5–5.2)
Sodium: 138 mmol/L (ref 134–144)
Total Protein: 6.9 g/dL (ref 6.0–8.5)
eGFR: 43 mL/min/{1.73_m2} — ABNORMAL LOW (ref >59)

## 2022-10-21 LAB — LIPID PANEL
Chol/HDL Ratio: 2.4 ratio (ref 0.0–4.4)
Cholesterol, Total: 111 mg/dL (ref 100–199)
HDL: 46 mg/dL (ref >39)
LDL Chol Calc (NIH): 39 mg/dL (ref 0–99)
Triglycerides: 153 mg/dL — ABNORMAL HIGH (ref 0–149)
VLDL Cholesterol Cal: 26 mg/dL (ref 5–40)

## 2022-10-21 LAB — CBC WITH DIFFERENTIAL/PLATELET
Basophils Absolute: 0.1 10*3/uL (ref 0.0–0.2)
Basos: 1 %
EOS (ABSOLUTE): 0.2 10*3/uL (ref 0.0–0.4)
Eos: 1 %
Hematocrit: 43.8 % (ref 34.0–46.6)
Hemoglobin: 14.4 g/dL (ref 11.1–15.9)
Immature Grans (Abs): 0 10*3/uL (ref 0.0–0.1)
Immature Granulocytes: 0 %
Lymphocytes Absolute: 1.7 10*3/uL (ref 0.7–3.1)
Lymphs: 17 %
MCH: 32.1 pg (ref 26.6–33.0)
MCHC: 32.9 g/dL (ref 31.5–35.7)
MCV: 98 fL — ABNORMAL HIGH (ref 79–97)
Monocytes Absolute: 0.4 10*3/uL (ref 0.1–0.9)
Monocytes: 4 %
Neutrophils Absolute: 8 10*3/uL — ABNORMAL HIGH (ref 1.4–7.0)
Neutrophils: 77 %
Platelets: 273 10*3/uL (ref 150–450)
RBC: 4.48 x10E6/uL (ref 3.77–5.28)
RDW: 13.1 % (ref 11.7–15.4)
WBC: 10.4 10*3/uL (ref 3.4–10.8)

## 2022-10-21 LAB — HEMOGLOBIN A1C
Est. average glucose Bld gHb Est-mCnc: 123 mg/dL
Hgb A1c MFr Bld: 5.9 % — ABNORMAL HIGH (ref 4.8–5.6)

## 2022-10-21 LAB — TSH: TSH: 1.28 u[IU]/mL (ref 0.450–4.500)

## 2022-11-02 DIAGNOSIS — J449 Chronic obstructive pulmonary disease, unspecified: Secondary | ICD-10-CM | POA: Diagnosis not present

## 2022-11-03 ENCOUNTER — Encounter: Payer: Self-pay | Admitting: Nurse Practitioner

## 2022-11-03 ENCOUNTER — Inpatient Hospital Stay: Payer: 59 | Attending: Nurse Practitioner | Admitting: Nurse Practitioner

## 2022-11-03 VITALS — BP 86/67 | HR 99 | Temp 96.6°F | Ht 63.0 in | Wt 151.8 lb

## 2022-11-03 DIAGNOSIS — Z923 Personal history of irradiation: Secondary | ICD-10-CM | POA: Diagnosis not present

## 2022-11-03 DIAGNOSIS — F17210 Nicotine dependence, cigarettes, uncomplicated: Secondary | ICD-10-CM | POA: Diagnosis not present

## 2022-11-03 DIAGNOSIS — Z7902 Long term (current) use of antithrombotics/antiplatelets: Secondary | ICD-10-CM | POA: Diagnosis not present

## 2022-11-03 DIAGNOSIS — Z79811 Long term (current) use of aromatase inhibitors: Secondary | ICD-10-CM | POA: Diagnosis not present

## 2022-11-03 DIAGNOSIS — Z79624 Long term (current) use of inhibitors of nucleotide synthesis: Secondary | ICD-10-CM | POA: Diagnosis not present

## 2022-11-03 DIAGNOSIS — Z9221 Personal history of antineoplastic chemotherapy: Secondary | ICD-10-CM | POA: Insufficient documentation

## 2022-11-03 DIAGNOSIS — Z08 Encounter for follow-up examination after completed treatment for malignant neoplasm: Secondary | ICD-10-CM

## 2022-11-03 DIAGNOSIS — Z79899 Other long term (current) drug therapy: Secondary | ICD-10-CM | POA: Diagnosis not present

## 2022-11-03 DIAGNOSIS — Z8673 Personal history of transient ischemic attack (TIA), and cerebral infarction without residual deficits: Secondary | ICD-10-CM | POA: Insufficient documentation

## 2022-11-03 DIAGNOSIS — Z170 Estrogen receptor positive status [ER+]: Secondary | ICD-10-CM | POA: Diagnosis not present

## 2022-11-03 DIAGNOSIS — Z87891 Personal history of nicotine dependence: Secondary | ICD-10-CM

## 2022-11-03 DIAGNOSIS — M8580 Other specified disorders of bone density and structure, unspecified site: Secondary | ICD-10-CM | POA: Diagnosis not present

## 2022-11-03 DIAGNOSIS — C50412 Malignant neoplasm of upper-outer quadrant of left female breast: Secondary | ICD-10-CM | POA: Insufficient documentation

## 2022-11-03 DIAGNOSIS — Z853 Personal history of malignant neoplasm of breast: Secondary | ICD-10-CM

## 2022-11-03 NOTE — Progress Notes (Signed)
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 She had cardiac monitor implanted in her left chest following. She performs self breast exams and denies lumps, pain, skin changes. No unintentional weight loss. Denies other complaints.   REVIEW OF SYSTEMS:   Review of Systems  Constitutional: Negative.  Negative for fever, malaise/fatigue and weight loss.  Respiratory: Negative.  Negative for cough, hemoptysis and shortness of breath.   Cardiovascular: Negative.  Negative for chest pain, palpitations and leg swelling.  Gastrointestinal: Negative.  Negative for abdominal pain.  Genitourinary: Negative.  Negative for dysuria.  Musculoskeletal: Negative.  Negative for joint pain.  Skin: Negative.  Negative for itching and rash.  Neurological: Negative.  Negative for dizziness, sensory change, focal weakness, loss of consciousness, weakness and headaches.  Psychiatric/Behavioral: Negative.  The patient is not nervous/anxious and does not have insomnia.   As per HPI. Otherwise, a complete review of systems is negative.  PAST MEDICAL HISTORY: Past Medical History:  Diagnosis Date   Abnormal Pap smear of  cervix    01/2015 ascus/neg- 04/2015 ascus/neg   Allergy    Anxiety    Arthritis    Asthma    Breast cancer (HCC) 2019   Chronic kidney disease    COPD (chronic obstructive pulmonary disease) (HCC)    DDD (degenerative disc disease), lumbar    Depression    Elevated lipids    Fibromyalgia    Fibromyalgia    Genetic testing 03/09/2017   Multi-Cancer panel (83 genes) @ [facility] - No pathogenic mutations detected   GERD (gastroesophageal reflux disease)    History of IBS    Hyperthyroidism    Joint disease    Migraine    Migraine    Oxygen deficiency    Personal history of chemotherapy    Personal history of radiation therapy    Restless leg syndrome    Sleep apnea    Does not use C-PAP, cannot tolerate mask    PAST SURGICAL HISTORY: Past Surgical History:  Procedure Laterality Date   ABDOMINAL HYSTERECTOMY  2008   BREAST BIOPSY Left 02/02/2017   [place] core positive   BREAST LUMPECTOMY Left 02/17/2017   BREAST LUMPECTOMY WITH NEEDLE LOCALIZATION Left 02/17/2017   Procedure: BREAST LUMPECTOMY WITH NEEDLE LOCALIZATION;  Surgeon: Ancil Linsey, MD;  Location: [facility];  Service: General;  Laterality: Left;   CARPAL TUNNEL RELEASE Bilateral    cryotherapy     DILATION AND CURETTAGE OF UTERUS     ENDOMETRIAL ABLATION     LAPAROSCOPIC OOPHERECTOMY Left    unsure which side but thinks its the left   LYSIS OF ADHESION Left 10/14/2015   Procedure: LYSIS OF ADHESION;  Surgeon: Juanell Fairly, MD;  Location: [facility];  Service: Orthopedics;  Laterality: Left;   NECK SURGERY     lower neck fusion rods  and screws   OOPHORECTOMY     one ovary removed    PORTA CATH INSERTION N/A 03/08/2017   Procedure: PORTA CATH INSERTION;  Surgeon: Renford Dills, MD;  Location: [facility];  Service: Cardiovascular;  Laterality: N/A;   RESECTION DISTAL CLAVICAL Left 10/14/2015   Procedure: RESECTION DISTAL CLAVICAL;  Surgeon: Juanell Fairly, MD;  Location: [facility];  Service:  Orthopedics;  Laterality: Left;   SENTINEL NODE BIOPSY Left 02/17/2017   Procedure: SENTINEL NODE BIOPSY;  Surgeon: Ancil Linsey, MD;  Location: [facility];  Service: General;  Laterality: Left;   SHOULDER ARTHROSCOPY WITH OPEN ROTATOR CUFF REPAIR Left 10/14/2015   Procedure: SHOULDER ARTHROSCOPY WITH OPEN ROTATOR CUFF REPAIR;  Surgeon: Juanell Fairly, MD;  Location: [facility];  Service: Orthopedics;  Laterality: Left;   SHOULDER ARTHROSCOPY WITH OPEN ROTATOR CUFF REPAIR AND DISTAL CLAVICLE ACROMINECTOMY Right 09/03/2014   Procedure: RIGHT SHOULDER ARTHROSCOPY WITH MINI OPEN ROTATOR CUFF TEAR;  Surgeon: Juanell Fairly, MD;  Location: [facility];  Service: Orthopedics;  Laterality: Right;  biceps tenodesis, arthroscopic subacromial decompression and distal clavicle incision   spinal injections     SUBACROMIAL DECOMPRESSION Left 10/14/2015   Procedure: SUBACROMIAL DECOMPRESSION;  Surgeon: Juanell Fairly, MD;  Location: [facility];  Service: Orthopedics;  Laterality: Left;   TEE WITHOUT CARDIOVERSION N/A 02/22/2021   Procedure: TRANSESOPHAGEAL ECHOCARDIOGRAM (TEE);  Surgeon: Lamar Blinks, MD;  Location: [facility];  Service: Cardiovascular;  Laterality: N/A;    FAMILY HISTORY: Family History  Problem Relation Age of Onset   Breast cancer Mother 21       Glioblastoma also; deceased at 64   Diabetes Father    Lung cancer Father        mesothelioma; deceased 26s   Cancer Maternal Aunt        "bone ca"; unk. primary   Colon cancer Maternal Grandfather        dx in 93s; deceased 47   Colon cancer Maternal Aunt        dx in 29s; currently 13s   Lung cancer Maternal Grandmother        smoker; deceased 47   Lung cancer Paternal Grandmother        smoker; deceased 13s   Breast cancer Cousin        dx 67s; daughter of mat aunt with unk. primary cancer   Cancer Other        distant cousin; unknown primary   Colon cancer Other        dx 27s; currently 47; maternal half-sister   Bipolar disorder  Sister    Schizophrenia Sister     ADVANCED DIRECTIVES (Y/N):  N  HEALTH MAINTENANCE: Social History   Tobacco Use   Smoking status: Every Day    Current packs/day: 1.00    Types: Cigarettes   Smokeless tobacco: Never  Vaping Use   Vaping status: Never Used  Substance Use Topics   Alcohol use: No    Alcohol/week: 0.0 standard drinks of alcohol   Drug use: No    Colonoscopy:  PAP:  Bone density:  Lipid panel:  Allergies  Allergen Reactions   Iodine    Bee Venom Hives and Rash   Cefoxitin Rash   Cefuroxime Axetil Hives and Rash   Cucumber Extract Rash   Gadolinium Derivatives Swelling, Other (See Comments) and Rash    NECK BECAME RED AND TONGUE WAS SWELLING SLIGHTLY PER PT NECK BECAME RED AND TONGUE WAS SWELLING SLIGHTLY PER PT  NECK BECAME RED AND TONGUE WAS SWELLING SLIGHTLY PER PT   Iodinated Contrast Media Hives, Rash and Other (See Comments)   Latex Rash   Other Hives and Rash    Bee Stings-swelling/hives/rash Wool (textile fiber)-Rash/itching   Tomato Rash and Other (See Comments)    Red tomatoes    Current Outpatient Medications  Medication Sig Dispense Refill   albuterol (VENTOLIN HFA) 108 (90 Base) MCG/ACT inhaler Inhale 1-2 puffs into the lungs every 6 (six) hours as needed for wheezing or shortness of breath. 8 g 3   baclofen (LIORESAL) 10 MG tablet Take 10 mg by mouth 3 (three) times daily as needed for muscle spasms (typically 2-3 times daily).      benzoyl peroxide-erythromycin (BENZAMYCIN) gel Apply to affected area 2 times daily 47 g 0   calcium carbonate (OS-CAL) 600 MG TABS tablet Take by mouth.     cholecalciferol (VITAMIN D) 1000 UNITS tablet Take 1,000 Units by mouth 2 (two) times daily.      clopidogrel (PLAVIX) 75 MG tablet Take 75 mg by mouth daily.     EPINEPHrine 0.3 mg/0.3 mL IJ SOAJ injection INJECT INTO THE MIDDLE OF THE OUTER THIGH AND HOLD FOR 10 SECONDS AS NEEDED FOR SEVERE ALLERGIC REACTION THEN CALL 911 IF USED 2 each 5   Multiple  Vitamins-Minerals (HAIR SKIN AND NAILS FORMULA) TABS Take 2 tablets by mouth daily.     OXYCODONE HCL PO Take 15 mg by mouth as needed. Take 4-6 times a day as needed.     phytonadione (VITAMIN K) 2 MG/ML SOLN oral solution Take 1 mg by mouth once.     pregabalin (LYRICA) 25 MG capsule Take 1 capsule by mouth 2 (two) times daily.     promethazine (PHENERGAN) 25 MG tablet Take 1 tablet (25 mg total) by mouth every 6 (six) hours as needed for nausea or vomiting. 30 tablet 0   rizatriptan (MAXALT-MLT) 10 MG disintegrating tablet Take 10 mg by mouth as needed for migraine. May repeat in 2 hours if needed     rOPINIRole (REQUIP) 1 MG tablet Take 1 mg by mouth at bedtime.     rosuvastatin (CRESTOR) 40 MG tablet Take 40 mg by mouth at bedtime.      topiramate (TOPAMAX) 100 MG tablet Take 200 mg by mouth daily.      trazodone (DESYREL) 300 MG tablet Take 1 tablet (300 mg total) by mouth at bedtime. 90 tablet 1   valACYclovir (VALTREX) 1000 MG tablet Take 1 tablet (1,000 mg total) by mouth every 8 (eight) hours for 28 days. 21 tablet 3   vitamin E 200 UNIT capsule Take 200 Units by mouth daily.     NARCAN 4 MG/0.1ML LIQD nasal spray kit  (Patient not taking: Reported on 10/20/2022)     No current facility-administered medications for this visit.    OBJECTIVE: Vitals:   11/03/22 1356  BP: (!) 86/67  Pulse: 99  Temp: (!) 96.6 F (35.9 C)  SpO2: 98%      Body mass index is 26.89 kg/m.    ECOG FS:0 - Asymptomatic  General: Well-developed, well-nourished, no acute distress. Eyes: Pink conjunctiva, anicteric sclera. Lungs: Clear to auscultation bilaterally.  No audible wheezing or coughing Heart: Regular rate and rhythm.  Abdomen: Soft, nontender, nondistended.  Musculoskeletal: No edema, cyanosis, or clubbing. Neuro: Alert, answering all questions appropriately. Cranial nerves grossly intact. Skin: No rashes or petechiae noted. Psych: Normal affect. Breast: Breast exam was performed in seated  and lying down position. Patient is status post left lumpectomy with a well-healed surgical scar. No evidence of any palpable masses. No evidence of axillary adenopathy. No evidence of any palpable masses or lumps in the right breast. No evidence of right axillary adenopathy    LAB RESULTS: No labs on site today  STUDIES: No results found. 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  PLAN:    1. 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  Patient initiated adjuvant chemotherapy with Taxol and Herceptin on March 13, 2017.  She completed Taxol on May 29, 2017.  Patient then received 4 additional cycles of Herceptin maintenance treatment, but this was permanently discontinued on August 21, 2017 secondary to persistently low EF on MUGA scan.  Letrozole was discontinued secondary to hot flashes and patient was transitioned to anastrozole. Although anastrozole was not the etiology of her CVA, patient declined to reinitiate treatment.  She expressed understanding that her risk of recurrence increases without taking an aromatase inhibitor for total of 5 years. Patient most recent mammogram on April 29, 2022 was reported as BI-RADS 1.  Repeat in March 2025. She has now been 5 years since completion of treatment and we can transition to annual visits. Transition to PCP surveillance after 10 years. We discussed symptoms that would be concerning for recurrent disease and warrant sooner evaluation. NCCN survivorship topics reviewed.   2.  Genetic testing: extensive family history of malignancy. [facility] genetic testing was negative.  3.  Joint pain: chronic and unchanged. Not significantly bothersome.  4.  Anxiety: Continue evaluation and treatment per psychiatry.  5.  Chronic  pain: Patient does not complain of this today.  Continue appointments with pain clinic as scheduled. 6.  Cardiac disease: Herceptin was previously discontinued permanently d/t low EF.  Continue follow-up and evaluation per cardiology.  Patient's most recent cardiac echo on February 22, 2021 revealed an ejection fraction of 60 to 65%.  Also reported was a positive bubble study consistent with a possible small PFO suggestive of intra-atrial shunt.  She is followed by cardiology, Dr Lady Gary.  7.  Osteopenia: Bone mineral density on September 26, 2020 was unchanged from previous with a T score approximately -2.0.  Repeat bone density in August 2023 was improved with t score -1.8. Continue calcium and vitamin D supplementation.  Repeat in August 2025.  8. Cancer screening- Recommended LDCT given her smoking history and referral sent to pulmonology. Per patient her other cancer screenings are up to date and she receives these with her pcp.   Disposition:  March- mammogram August - DEXA 1 year- breast cancer surveillance- [place]  Patient expressed understanding and was in agreement with this plan. She also understands that She can call clinic at any time with any questions, concerns, or complaints.   Thank you for allowing me to participate in the care of this pleasant patient.    Cancer Staging  Malignant neoplasm of upper-outer quadrant of left breast in female, estrogen receptor positive (HCC) Staging form: Breast, AJCC 8th Edition - Clinical stage from 02/07/2017: Stage IA (cT1c, cN0, cM0, G1, ER+, PR+, HER2+) - Signed by Jeralyn Ruths, MD on 02/11/2017 Neoadjuvant therapy: No Histologic grading system: 3 grade system Laterality: Left   Alinda Dooms, NP   11/03/2022   CC: Orson Eva,  NP

## 2022-11-07 DIAGNOSIS — N1831 Chronic kidney disease, stage 3a: Secondary | ICD-10-CM | POA: Diagnosis not present

## 2022-11-10 ENCOUNTER — Encounter: Payer: Self-pay | Admitting: Cardiology

## 2022-11-10 DIAGNOSIS — M25512 Pain in left shoulder: Secondary | ICD-10-CM | POA: Diagnosis not present

## 2022-11-10 DIAGNOSIS — Z79891 Long term (current) use of opiate analgesic: Secondary | ICD-10-CM | POA: Diagnosis not present

## 2022-11-10 DIAGNOSIS — Z5181 Encounter for therapeutic drug level monitoring: Secondary | ICD-10-CM | POA: Diagnosis not present

## 2022-11-10 DIAGNOSIS — M5459 Other low back pain: Secondary | ICD-10-CM | POA: Diagnosis not present

## 2022-11-10 DIAGNOSIS — M542 Cervicalgia: Secondary | ICD-10-CM | POA: Diagnosis not present

## 2022-11-10 DIAGNOSIS — F17290 Nicotine dependence, other tobacco product, uncomplicated: Secondary | ICD-10-CM | POA: Diagnosis not present

## 2022-11-10 DIAGNOSIS — G894 Chronic pain syndrome: Secondary | ICD-10-CM | POA: Diagnosis not present

## 2022-11-17 DIAGNOSIS — L218 Other seborrheic dermatitis: Secondary | ICD-10-CM | POA: Diagnosis not present

## 2022-11-17 DIAGNOSIS — L700 Acne vulgaris: Secondary | ICD-10-CM | POA: Diagnosis not present

## 2022-11-29 ENCOUNTER — Other Ambulatory Visit: Payer: Self-pay

## 2022-11-29 DIAGNOSIS — G4700 Insomnia, unspecified: Secondary | ICD-10-CM

## 2022-11-29 MED ORDER — TRAZODONE HCL 300 MG PO TABS: 300.0000 mg | ORAL_TABLET | Freq: Every day | ORAL | 1 refills | Status: DC

## 2022-11-30 ENCOUNTER — Encounter: Payer: Self-pay | Admitting: Cardiology

## 2022-12-01 ENCOUNTER — Other Ambulatory Visit: Payer: Self-pay | Admitting: Cardiology

## 2022-12-01 DIAGNOSIS — Z77120 Contact with and (suspected) exposure to mold (toxic): Secondary | ICD-10-CM

## 2022-12-03 DIAGNOSIS — J449 Chronic obstructive pulmonary disease, unspecified: Secondary | ICD-10-CM | POA: Diagnosis not present

## 2022-12-05 ENCOUNTER — Other Ambulatory Visit: Payer: 59

## 2022-12-05 DIAGNOSIS — Z77120 Contact with and (suspected) exposure to mold (toxic): Secondary | ICD-10-CM | POA: Diagnosis not present

## 2022-12-08 DIAGNOSIS — G894 Chronic pain syndrome: Secondary | ICD-10-CM | POA: Diagnosis not present

## 2022-12-08 DIAGNOSIS — M25512 Pain in left shoulder: Secondary | ICD-10-CM | POA: Diagnosis not present

## 2022-12-08 DIAGNOSIS — M5459 Other low back pain: Secondary | ICD-10-CM | POA: Diagnosis not present

## 2022-12-08 DIAGNOSIS — Z79891 Long term (current) use of opiate analgesic: Secondary | ICD-10-CM | POA: Diagnosis not present

## 2022-12-08 DIAGNOSIS — M5136 Other intervertebral disc degeneration, lumbar region with discogenic back pain only: Secondary | ICD-10-CM | POA: Diagnosis not present

## 2022-12-08 DIAGNOSIS — Z5181 Encounter for therapeutic drug level monitoring: Secondary | ICD-10-CM | POA: Diagnosis not present

## 2022-12-08 LAB — ALLERGEN PROFILE, MOLD
Alternaria Alternata IgE: <0.1 kU/L
Aspergillus Fumigatus IgE: <0.1 kU/L
Aureobasidi Pullulans IgE: <0.1 kU/L
Candida Albicans IgE: <0.1 kU/L
Cladosporium Herbarum IgE: <0.1 kU/L
M009-IgE Fusarium proliferatum: <0.1 kU/L
M014-IgE Epicoccum purpur: <0.1 kU/L
Mucor Racemosus IgE: <0.1 kU/L
Penicillium Chrysogen IgE: <0.1 kU/L
Phoma Betae IgE: <0.1 kU/L
Setomelanomma Rostrat: <0.1 kU/L
Stemphylium Herbarum IgE: <0.1 kU/L

## 2022-12-09 ENCOUNTER — Other Ambulatory Visit: Payer: Self-pay

## 2022-12-09 DIAGNOSIS — Z87891 Personal history of nicotine dependence: Secondary | ICD-10-CM

## 2022-12-09 DIAGNOSIS — F17210 Nicotine dependence, cigarettes, uncomplicated: Secondary | ICD-10-CM

## 2022-12-09 DIAGNOSIS — Z122 Encounter for screening for malignant neoplasm of respiratory organs: Secondary | ICD-10-CM

## 2022-12-14 ENCOUNTER — Ambulatory Visit (INDEPENDENT_AMBULATORY_CARE_PROVIDER_SITE_OTHER): Payer: 59 | Admitting: Acute Care

## 2022-12-14 DIAGNOSIS — F17210 Nicotine dependence, cigarettes, uncomplicated: Secondary | ICD-10-CM

## 2022-12-14 NOTE — Progress Notes (Addendum)
 Provider Attestation I agree with the documentation of the Shared Decision Making visit,  smoking cessation counseling if appropriate, and verification or eligibility for lung cancer screening as documented by the RN Nurse Navigator.   Lauraine PHEBE Lites, MSN, AGACNP-BC [facility] See Amion for personal pager PCCM on call pager [phone]      Virtual Visit via Telephone Note  I connected with Briana [name] on 12/14/22 at 10:30 AM EST by telephone and verified that I am speaking with the correct person using two identifiers.  Location: Patient: Briana [name]. [place]  Provider: Laneta Speaks, RN   I discussed the limitations, risks, security and privacy concerns of performing an evaluation and management service by telephone and the availability of in person appointments. I also discussed with the patient that there may be a patient responsible charge related to this service. The patient expressed understanding and agreed to proceed.   Shared Decision Making Visit Lung Cancer Screening Program [phone])   Eligibility: Age 57 y.o. Pack Years Smoking History Calculation 48 (# packs/per year x # years smoked) Recent History of coughing up blood  no Unexplained weight loss? no ( >Than 15 pounds within the last 6 months ) Prior History Lung / other cancer no (Diagnosis within the last 5 years already requiring surveillance chest CT Scans). Smoking Status Current Smoker   Visit Components: Discussion included one or more decision making aids. yes Discussion included risk/benefits of screening. yes Discussion included potential follow up diagnostic testing for abnormal scans. yes Discussion included meaning and risk of over diagnosis. yes Discussion included meaning and risk of False Positives. yes Discussion included meaning of total radiation exposure. yes  Counseling Included: Importance of adherence to annual lung cancer LDCT screening. yes Impact of  comorbidities on ability to participate in the program. yes Ability and willingness to under diagnostic treatment. yes  Smoking Cessation Counseling: Current Smokers:  Discussed importance of smoking cessation. yes Information about tobacco cessation classes and interventions provided to patient. yes Patient provided with ticket for LDCT Scan. yes Symptomatic Patient. no  Counseling(Intermediate counseling: > three minutes) 99406 Diagnosis Code: Tobacco Use Z72.0 Asymptomatic Patient yes  Counseling (Intermediate counseling: > three minutes counseling) [id number] Former Smokers:  Discussed the importance of maintaining cigarette abstinence. yes Diagnosis Code: Personal History of Nicotine Dependence. S12.108 Information about tobacco cessation classes and interventions provided to patient. Yes Patient provided with ticket for LDCT Scan. no Written Order for Lung Cancer Screening with LDCT placed in Epic. Yes (CT Chest Lung Cancer Screening Low Dose W/O CM) [id number] Z12.2-Screening of respiratory organs Z87.891-Personal history of nicotine dependence   Laneta Speaks, RN

## 2022-12-14 NOTE — Patient Instructions (Signed)

## 2022-12-19 DIAGNOSIS — Z95818 Presence of other cardiac implants and grafts: Secondary | ICD-10-CM | POA: Diagnosis not present

## 2022-12-19 DIAGNOSIS — Z5181 Encounter for therapeutic drug level monitoring: Secondary | ICD-10-CM | POA: Diagnosis not present

## 2022-12-19 DIAGNOSIS — Z79891 Long term (current) use of opiate analgesic: Secondary | ICD-10-CM | POA: Diagnosis not present

## 2022-12-19 DIAGNOSIS — G894 Chronic pain syndrome: Secondary | ICD-10-CM | POA: Diagnosis not present

## 2022-12-19 DIAGNOSIS — M5459 Other low back pain: Secondary | ICD-10-CM | POA: Diagnosis not present

## 2022-12-21 ENCOUNTER — Ambulatory Visit
Admission: RE | Admit: 2022-12-21 | Discharge: 2022-12-21 | Disposition: A | Discharge status: Home / Self Care | Payer: 59 | Source: Ambulatory Visit | Attending: Acute Care | Admitting: Acute Care

## 2022-12-21 DIAGNOSIS — Z122 Encounter for screening for malignant neoplasm of respiratory organs: Secondary | ICD-10-CM | POA: Diagnosis not present

## 2022-12-21 DIAGNOSIS — F17210 Nicotine dependence, cigarettes, uncomplicated: Secondary | ICD-10-CM

## 2022-12-21 DIAGNOSIS — Z87891 Personal history of nicotine dependence: Secondary | ICD-10-CM

## 2022-12-21 DIAGNOSIS — I2510 Atherosclerotic heart disease of native coronary artery without angina pectoris: Secondary | ICD-10-CM

## 2022-12-21 HISTORY — DX: Atherosclerotic heart disease of native coronary artery without angina pectoris: I25.10

## 2022-12-28 ENCOUNTER — Other Ambulatory Visit: Payer: Self-pay | Admitting: Cardiology

## 2022-12-28 DIAGNOSIS — L709 Acne, unspecified: Secondary | ICD-10-CM

## 2023-01-02 ENCOUNTER — Other Ambulatory Visit: Payer: Self-pay

## 2023-01-02 DIAGNOSIS — J449 Chronic obstructive pulmonary disease, unspecified: Secondary | ICD-10-CM | POA: Diagnosis not present

## 2023-01-02 MED ORDER — EPINEPHRINE 0.3 MG/0.3ML IJ SOAJ: 0.3000 mg | INTRAMUSCULAR | 5 refills | Status: DC | PRN

## 2023-01-02 MED ORDER — BENZOYL PEROXIDE-ERYTHROMYCIN 5-3 % EX GEL: CUTANEOUS | 0 refills | Status: DC

## 2023-01-04 ENCOUNTER — Other Ambulatory Visit: Payer: Self-pay

## 2023-01-04 DIAGNOSIS — G894 Chronic pain syndrome: Secondary | ICD-10-CM | POA: Diagnosis not present

## 2023-01-04 DIAGNOSIS — Z79891 Long term (current) use of opiate analgesic: Secondary | ICD-10-CM | POA: Diagnosis not present

## 2023-01-04 DIAGNOSIS — G4700 Insomnia, unspecified: Secondary | ICD-10-CM

## 2023-01-05 MED ORDER — TRAZODONE HCL 300 MG PO TABS: 300.0000 mg | ORAL_TABLET | Freq: Every day | ORAL | 1 refills | Status: DC

## 2023-01-12 DIAGNOSIS — I10 Essential (primary) hypertension: Secondary | ICD-10-CM | POA: Diagnosis not present

## 2023-01-12 DIAGNOSIS — I5020 Unspecified systolic (congestive) heart failure: Secondary | ICD-10-CM | POA: Diagnosis not present

## 2023-01-12 DIAGNOSIS — Z720 Tobacco use: Secondary | ICD-10-CM | POA: Diagnosis not present

## 2023-01-12 DIAGNOSIS — E782 Mixed hyperlipidemia: Secondary | ICD-10-CM | POA: Diagnosis not present

## 2023-01-12 DIAGNOSIS — J449 Chronic obstructive pulmonary disease, unspecified: Secondary | ICD-10-CM | POA: Diagnosis not present

## 2023-01-12 DIAGNOSIS — N182 Chronic kidney disease, stage 2 (mild): Secondary | ICD-10-CM | POA: Diagnosis not present

## 2023-01-12 DIAGNOSIS — I63119 Cerebral infarction due to embolism of unspecified vertebral artery: Secondary | ICD-10-CM | POA: Diagnosis not present

## 2023-01-12 DIAGNOSIS — I5032 Chronic diastolic (congestive) heart failure: Secondary | ICD-10-CM | POA: Diagnosis not present

## 2023-01-12 DIAGNOSIS — G4730 Sleep apnea, unspecified: Secondary | ICD-10-CM | POA: Diagnosis not present

## 2023-01-12 DIAGNOSIS — Z95818 Presence of other cardiac implants and grafts: Secondary | ICD-10-CM | POA: Diagnosis not present

## 2023-01-13 ENCOUNTER — Other Ambulatory Visit: Payer: Self-pay | Admitting: Acute Care

## 2023-01-13 DIAGNOSIS — F17210 Nicotine dependence, cigarettes, uncomplicated: Secondary | ICD-10-CM

## 2023-01-13 DIAGNOSIS — Z87891 Personal history of nicotine dependence: Secondary | ICD-10-CM

## 2023-01-13 DIAGNOSIS — Z122 Encounter for screening for malignant neoplasm of respiratory organs: Secondary | ICD-10-CM

## 2023-01-15 ENCOUNTER — Encounter: Payer: Self-pay | Admitting: Cardiology

## 2023-01-20 ENCOUNTER — Other Ambulatory Visit: Payer: Self-pay | Admitting: Cardiology

## 2023-01-20 DIAGNOSIS — N200 Calculus of kidney: Secondary | ICD-10-CM

## 2023-01-20 DIAGNOSIS — G4700 Insomnia, unspecified: Secondary | ICD-10-CM

## 2023-01-23 MED ORDER — ROSUVASTATIN CALCIUM 40 MG PO TABS: 40.0000 mg | ORAL_TABLET | Freq: Every day | ORAL | 1 refills | Status: DC

## 2023-01-23 MED ORDER — TRAZODONE HCL 300 MG PO TABS: 300.0000 mg | ORAL_TABLET | Freq: Every day | ORAL | 1 refills | Status: DC

## 2023-01-30 MED ORDER — ROPINIROLE HCL 1 MG PO TABS: 1.0000 mg | ORAL_TABLET | Freq: Every day | ORAL | 0 refills | Status: DC

## 2023-01-30 MED ORDER — CLOPIDOGREL BISULFATE 75 MG PO TABS: 75.0000 mg | ORAL_TABLET | Freq: Every day | ORAL | 0 refills | Status: DC

## 2023-02-02 DIAGNOSIS — J449 Chronic obstructive pulmonary disease, unspecified: Secondary | ICD-10-CM | POA: Diagnosis not present

## 2023-02-06 DIAGNOSIS — G894 Chronic pain syndrome: Secondary | ICD-10-CM | POA: Diagnosis not present

## 2023-02-06 DIAGNOSIS — Z79891 Long term (current) use of opiate analgesic: Secondary | ICD-10-CM | POA: Diagnosis not present

## 2023-02-08 ENCOUNTER — Ambulatory Visit
Admission: RE | Admit: 2023-02-08 | Discharge: 2023-02-08 | Disposition: A | Discharge status: Home / Self Care | Payer: 59 | Source: Ambulatory Visit | Attending: Urology | Admitting: Urology

## 2023-02-08 ENCOUNTER — Encounter: Payer: Self-pay | Admitting: Cardiology

## 2023-02-08 ENCOUNTER — Ambulatory Visit (INDEPENDENT_AMBULATORY_CARE_PROVIDER_SITE_OTHER): Payer: 59 | Admitting: Urology

## 2023-02-08 ENCOUNTER — Telehealth: Payer: Self-pay | Admitting: Cardiology

## 2023-02-08 VITALS — BP 112/75 | HR 92 | Ht 63.0 in | Wt 152.0 lb

## 2023-02-08 DIAGNOSIS — N200 Calculus of kidney: Secondary | ICD-10-CM | POA: Insufficient documentation

## 2023-02-08 DIAGNOSIS — N2889 Other specified disorders of kidney and ureter: Secondary | ICD-10-CM | POA: Diagnosis not present

## 2023-02-08 DIAGNOSIS — M545 Low back pain, unspecified: Secondary | ICD-10-CM

## 2023-02-08 DIAGNOSIS — Z01818 Encounter for other preprocedural examination: Secondary | ICD-10-CM

## 2023-02-08 NOTE — H&P (View-Only) (Signed)
Briana [name],acting as a scribe for Briana Scotland, MD.,have documented all relevant documentation on the behalf of Briana Scotland, MD,as directed by  Briana Scotland, MD while in the presence of Briana Scotland, MD.  02/08/23 2:21 PM   Briana [name] 04/02/1965 [id number]  Referring provider: Marisue Ivan, NP [address] [place],  [place] [address]  Chief Complaint  Patient presents with   Establish Care   Nephrolithiasis    HPI: 58 year-old female who presents today for further evaluation of kidney stones.   She messaged her primary care saying she was having pain in her right lower back. Reply message indicated that the stone was on the contralateral side and then she followed up that she is also having some pain on the left side as well. A referral was made to urology for further evaluation of this.   She also has a personal history of fibromyalgia, anxiety, and breast cancer. She had a screening CT of her lungs on 01/13/2023, which I am personally reviewing today, which incidentally identified a left renal stone measuring almost 8 mm in the left upper pole and 780 Hounsfield units. Her kidneys were incompletely imaged on the study. She does not have any other recent cross-sectional imaging for comparison.   Urinalysis today shows 3-10 RBC, 6-10 WBC, nitrate negative.   She reports pain in the right lower back, which started a couple of weeks ago and has progressively worsened. The pain occurs sometimes in the evenings, at night, and during urination. The pain does not improve with stretching or movement and worsens with certain positions.  She reports a history of kidney stones in her late 20s, which she passed with significant pain.  PMH: Past Medical History:  Diagnosis Date   Abnormal Pap smear of cervix    01/2015 ascus/neg- 04/2015 ascus/neg   Allergy    Anxiety    Arthritis    Asthma    Breast cancer (HCC) 2019   Chronic kidney disease    COPD (chronic  obstructive pulmonary disease) (HCC)    DDD (degenerative disc disease), lumbar    Depression    Elevated lipids    Fibromyalgia    Fibromyalgia    Genetic testing 03/09/2017   Multi-Cancer panel (83 genes) @ [facility] - No pathogenic mutations detected   GERD (gastroesophageal reflux disease)    History of IBS    Hyperthyroidism    Joint disease    Migraine    Migraine    Oxygen deficiency    Personal history of chemotherapy    Personal history of radiation therapy    Restless leg syndrome    Sleep apnea    Does not use C-PAP, cannot tolerate mask    Surgical History: Past Surgical History:  Procedure Laterality Date   ABDOMINAL HYSTERECTOMY  2008   BREAST BIOPSY Left 02/02/2017   [place] core positive   BREAST LUMPECTOMY Left 02/17/2017   BREAST LUMPECTOMY WITH NEEDLE LOCALIZATION Left 02/17/2017   Procedure: BREAST LUMPECTOMY WITH NEEDLE LOCALIZATION;  Surgeon: Ancil Linsey, MD;  Location: [facility];  Service: General;  Laterality: Left;   CARPAL TUNNEL RELEASE Bilateral    cryotherapy     DILATION AND CURETTAGE OF UTERUS     ENDOMETRIAL ABLATION     LAPAROSCOPIC OOPHERECTOMY Left    unsure which side but thinks its the left   LYSIS OF ADHESION Left 10/14/2015   Procedure: LYSIS OF ADHESION;  Surgeon: Juanell Fairly, MD;  Location: [facility];  Service: Orthopedics;  Laterality: Left;   NECK SURGERY     lower neck fusion rods and screws   OOPHORECTOMY     one ovary removed    PORTA CATH INSERTION N/A 03/08/2017   Procedure: PORTA CATH INSERTION;  Surgeon: Renford Dills, MD;  Location: [facility];  Service: Cardiovascular;  Laterality: N/A;   RESECTION DISTAL CLAVICAL Left 10/14/2015   Procedure: RESECTION DISTAL CLAVICAL;  Surgeon: Juanell Fairly, MD;  Location: [facility];  Service: Orthopedics;  Laterality: Left;   SENTINEL NODE BIOPSY Left 02/17/2017   Procedure: SENTINEL NODE BIOPSY;  Surgeon: Ancil Linsey, MD;  Location: [facility];  Service: General;   Laterality: Left;   SHOULDER ARTHROSCOPY WITH OPEN ROTATOR CUFF REPAIR Left 10/14/2015   Procedure: SHOULDER ARTHROSCOPY WITH OPEN ROTATOR CUFF REPAIR;  Surgeon: Juanell Fairly, MD;  Location: [facility];  Service: Orthopedics;  Laterality: Left;   SHOULDER ARTHROSCOPY WITH OPEN ROTATOR CUFF REPAIR AND DISTAL CLAVICLE ACROMINECTOMY Right 09/03/2014   Procedure: RIGHT SHOULDER ARTHROSCOPY WITH MINI OPEN ROTATOR CUFF TEAR;  Surgeon: Juanell Fairly, MD;  Location: [facility];  Service: Orthopedics;  Laterality: Right;  biceps tenodesis, arthroscopic subacromial decompression and distal clavicle incision   spinal injections     SUBACROMIAL DECOMPRESSION Left 10/14/2015   Procedure: SUBACROMIAL DECOMPRESSION;  Surgeon: Juanell Fairly, MD;  Location: [facility];  Service: Orthopedics;  Laterality: Left;   TEE WITHOUT CARDIOVERSION N/A 02/22/2021   Procedure: TRANSESOPHAGEAL ECHOCARDIOGRAM (TEE);  Surgeon: Lamar Blinks, MD;  Location: [facility];  Service: Cardiovascular;  Laterality: N/A;    Home Medications:  Allergies as of 02/08/2023       Reactions   Iodine    Bee Venom Hives, Rash   Cefoxitin Rash   Cefuroxime Axetil Hives, Rash   Cucumber Extract Rash   Gadolinium Derivatives Swelling, Other (See Comments), Rash   NECK BECAME RED AND TONGUE WAS SWELLING SLIGHTLY PER PT NECK BECAME RED AND TONGUE WAS SWELLING SLIGHTLY PER PT NECK BECAME RED AND TONGUE WAS SWELLING SLIGHTLY PER PT   Iodinated Contrast Media Hives, Rash, Other (See Comments)   Latex Rash   Other Hives, Rash   Bee Stings-swelling/hives/rash Wool (textile fiber)-Rash/itching   Tomato Rash, Other (See Comments)   Red tomatoes        Medication List        Accurate as of February 08, 2023  2:21 PM. If you have any questions, ask your nurse or doctor.          albuterol 108 (90 Base) MCG/ACT inhaler Commonly known as: VENTOLIN HFA Inhale 1-2 puffs into the lungs every 6 (six) hours as needed for wheezing or shortness  of breath.   baclofen 10 MG tablet Commonly known as: LIORESAL Take 10 mg by mouth 3 (three) times daily as needed for muscle spasms (typically 2-3 times daily).   benzoyl peroxide-erythromycin gel Commonly known as: Benzamycin Apply to affected area 2 times daily   calcium carbonate 600 MG Tabs tablet Commonly known as: OS-CAL Take by mouth.   cholecalciferol 1000 units tablet Commonly known as: VITAMIN D Take 1,000 Units by mouth 2 (two) times daily.   clopidogrel 75 MG tablet Commonly known as: PLAVIX Take 1 tablet (75 mg total) by mouth daily.   EPINEPHrine 0.3 mg/0.3 mL Soaj injection Commonly known as: EPI-PEN Inject 0.3 mg into the muscle as needed for anaphylaxis.   Hair Skin and Nails Formula Tabs Take 2 tablets by mouth daily.   Narcan 4  MG/0.1ML Liqd nasal spray kit Generic drug: naloxone   OXYCODONE HCL PO Take 15 mg by mouth as needed. Take 4-6 times a day as needed.   phytonadione 2 MG/ML Soln oral solution Commonly known as: VITAMIN K Take 1 mg by mouth once.   pregabalin 25 MG capsule Commonly known as: LYRICA Take 1 capsule by mouth 2 (two) times daily.   promethazine 25 MG tablet Commonly known as: PHENERGAN Take 1 tablet (25 mg total) by mouth every 6 (six) hours as needed for nausea or vomiting.   rizatriptan 10 MG disintegrating tablet Commonly known as: MAXALT-MLT Take 10 mg by mouth as needed for migraine. May repeat in 2 hours if needed   rOPINIRole 1 MG tablet Commonly known as: REQUIP Take 1 tablet (1 mg total) by mouth at bedtime.   rosuvastatin 40 MG tablet Commonly known as: CRESTOR Take 1 tablet (40 mg total) by mouth at bedtime.   topiramate 100 MG tablet Commonly known as: TOPAMAX Take 200 mg by mouth daily.   trazodone 300 MG tablet Commonly known as: DESYREL Take 1 tablet (300 mg total) by mouth at bedtime.   vitamin E 200 UNIT capsule Take 200 Units by mouth daily.        Allergies:  Allergies  Allergen  Reactions   Iodine    Bee Venom Hives and Rash   Cefoxitin Rash   Cefuroxime Axetil Hives and Rash   Cucumber Extract Rash   Gadolinium Derivatives Swelling, Other (See Comments) and Rash    NECK BECAME RED AND TONGUE WAS SWELLING SLIGHTLY PER PT NECK BECAME RED AND TONGUE WAS SWELLING SLIGHTLY PER PT NECK BECAME RED AND TONGUE WAS SWELLING SLIGHTLY PER PT   Iodinated Contrast Media Hives, Rash and Other (See Comments)   Latex Rash   Other Hives and Rash    Bee Stings-swelling/hives/rash Wool (textile fiber)-Rash/itching   Tomato Rash and Other (See Comments)    Red tomatoes    Family History: Family History  Problem Relation Age of Onset   Breast cancer Mother 24       Glioblastoma also; deceased at 55   Diabetes Father    Lung cancer Father        mesothelioma; deceased 31s   Cancer Maternal Aunt        "bone ca"; unk. primary   Colon cancer Maternal Grandfather        dx in 28s; deceased 17   Colon cancer Maternal Aunt        dx in 42s; currently 57s   Lung cancer Maternal Grandmother        smoker; deceased 68   Lung cancer Paternal Grandmother        smoker; deceased 34s   Breast cancer Cousin        dx 62s; daughter of mat aunt with unk. primary cancer   Cancer Other        distant cousin; unknown primary   Colon cancer Other        dx 49s; currently 68; maternal half-sister   Bipolar disorder Sister    Schizophrenia Sister     Social History:  reports that she has been smoking cigarettes. She started smoking about 51 years ago. She has a 51 pack-year smoking history. She has never used smokeless tobacco. She reports that she does not drink alcohol and does not use drugs.   Physical Exam: BP 112/75   Pulse 92   Ht 5\' 3"  (1.6 m)  Wt 152 lb (68.9 kg)   LMP 08/24/2008   BMI 26.93 kg/m   Constitutional:  Alert and oriented, No acute distress. HEENT: [place] AT, moist mucus membranes.  Trachea midline, no masses. Neurologic: Grossly intact, no focal deficits,  moving all 4 extremities. Psychiatric: Normal mood and affect.  Pertinent Imaging: EXAM: CT CHEST WITHOUT CONTRAST LOW-DOSE FOR LUNG CANCER SCREENING   TECHNIQUE: Multidetector CT imaging of the chest was performed following the standard protocol without IV contrast.   RADIATION DOSE REDUCTION: This exam was performed according to the departmental dose-optimization program which includes automated exposure control, adjustment of the mA and/or kV according to patient size and/or use of iterative reconstruction technique.   COMPARISON:  None Available.   FINDINGS: Cardiovascular: Atherosclerotic calcification of the aorta with age advanced involvement of all 3 coronary arteries. Heart size normal. No pericardial effusion.   Mediastinum/Nodes: No pathologically enlarged mediastinal or axillary lymph nodes. Hilar regions are difficult to definitively evaluate without IV contrast. Esophagus is grossly unremarkable.   Lungs/Pleura: Centrilobular and paraseptal emphysema. Smoking related respiratory bronchiolitis. No suspicious pulmonary nodules. No pleural fluid. Airway is unremarkable.   Upper Abdomen: 11 mm left renal stone. Visualized portions of the liver, gallbladder, adrenal glands, kidneys, spleen, pancreas, stomach and bowel are otherwise grossly unremarkable. No upper abdominal adenopathy.   Musculoskeletal: Degenerative changes in the spine.   IMPRESSION: 1. Lung-RADS 1, negative. Continue annual screening with low-dose chest CT without contrast in 12 months. 2. Age advanced three-vessel coronary artery calcification. 3. Left renal stone. 4.  Aortic atherosclerosis (ICD10-I70.0). 5.  Emphysema (ICD10-J43.9).     Electronically Signed   By: Leanna Battles M.D.   On: 01/13/2023 08:39  This was personally reviewed and I agree with the radiologic interpretation.    Assessment & Plan:    1. Left renal stone - Stone is non-obstructing and likely not the cause  of the current pain. It measures approximately 8mm, which is not typically passable. - Plan to obtain a KUB to assess for additional stones and determine the appropriate treatment approach. - Discussed potential treatment options: shockwave lithotripsy if only one stone is present, or ureteroscopy if multiple stones are found. - Provided the patient with a lithotripsy information packet for review. - Will coordinate with her cardiologist regarding holding blood thinners prior to any procedure - Pre-op urine culture - Lithotripsy packet was provided just in case. Moving forward with procedure is dependent on KUB results. - We discussed various treatment options for urolithiasis including observation with or without medical expulsive therapy, shockwave lithotripsy (SWL), ureteroscopy and laser lithotripsy with stent placement, and percutaneous nephrolithotomy.   We discussed that management is based on stone size, location, density, patient co-morbidities, and patient preference.    Stones <63mm in size have a >80% spontaneous passage rate. Data surrounding the use of tamsulosin for medical expulsive therapy is controversial, but meta analyses suggests it is most efficacious for distal stones between 5-39mm in size. Possible side effects include dizziness/lightheadedness, and retrograde ejaculation.   SWL has a lower stone free rate in a single procedure, but also a lower complication rate compared to ureteroscopy and avoids a stent and associated stent related symptoms. Possible complications include renal hematoma, steinstrasse, and need for additional treatment. We discussed the role of his increased skin to stone distance can lead to decreased efficacy with shockwave lithotripsy.   Ureteroscopy with laser lithotripsy and stent placement has a higher stone free rate than SWL in a single procedure, however increased  complication rate including possible infection, ureteral injury, bleeding, and stent  related morbidity. Common stent related symptoms include dysuria, urgency/frequency, and flank pain.   2. Left lower back pain - Suspected to be musculoskeletal in origin, possibly related to her history of degenerative joint disease. - Pain is not typical for kidney stone-related pain, as it is located in the lower back and not associated with the stone's position. - No immediate intervention planned; will reassess following imaging results.  Will call / message with KUB results and and book appropriate surgery.  Agreed that if solitary stone, prefers shockwave and if multiple stones, ureteroscopy.  I have reviewed the above documentation for accuracy and completeness, and I agree with the above.   Briana Scotland, MD   [facility] [address], [address] [place], [place] [address] [phone]

## 2023-02-08 NOTE — Progress Notes (Signed)
 Briana [name],acting as a scribe for Briana Scotland, MD.,have documented all relevant documentation on the behalf of Briana Scotland, MD,as directed by  Briana Scotland, MD while in the presence of Briana Scotland, MD.  02/08/23 2:21 PM   Briana [name] 04/02/1965 [id number]  Referring provider: Marisue Ivan, NP [address] [place],  [place] [address]  Chief Complaint  Patient presents with   Establish Care   Nephrolithiasis    HPI: 58 year-old female who presents today for further evaluation of kidney stones.   She messaged her primary care saying she was having pain in her right lower back. Reply message indicated that the stone was on the contralateral side and then she followed up that she is also having some pain on the left side as well. A referral was made to urology for further evaluation of this.   She also has a personal history of fibromyalgia, anxiety, and breast cancer. She had a screening CT of her lungs on 01/13/2023, which I am personally reviewing today, which incidentally identified a left renal stone measuring almost 8 mm in the left upper pole and 780 Hounsfield units. Her kidneys were incompletely imaged on the study. She does not have any other recent cross-sectional imaging for comparison.   Urinalysis today shows 3-10 RBC, 6-10 WBC, nitrate negative.   She reports pain in the right lower back, which started a couple of weeks ago and has progressively worsened. The pain occurs sometimes in the evenings, at night, and during urination. The pain does not improve with stretching or movement and worsens with certain positions.  She reports a history of kidney stones in her late 20s, which she passed with significant pain.  PMH: Past Medical History:  Diagnosis Date   Abnormal Pap smear of cervix    01/2015 ascus/neg- 04/2015 ascus/neg   Allergy    Anxiety    Arthritis    Asthma    Breast cancer (HCC) 2019   Chronic kidney disease    COPD (chronic  obstructive pulmonary disease) (HCC)    DDD (degenerative disc disease), lumbar    Depression    Elevated lipids    Fibromyalgia    Fibromyalgia    Genetic testing 03/09/2017   Multi-Cancer panel (83 genes) @ [facility] - No pathogenic mutations detected   GERD (gastroesophageal reflux disease)    History of IBS    Hyperthyroidism    Joint disease    Migraine    Migraine    Oxygen deficiency    Personal history of chemotherapy    Personal history of radiation therapy    Restless leg syndrome    Sleep apnea    Does not use C-PAP, cannot tolerate mask    Surgical History: Past Surgical History:  Procedure Laterality Date   ABDOMINAL HYSTERECTOMY  2008   BREAST BIOPSY Left 02/02/2017   [place] core positive   BREAST LUMPECTOMY Left 02/17/2017   BREAST LUMPECTOMY WITH NEEDLE LOCALIZATION Left 02/17/2017   Procedure: BREAST LUMPECTOMY WITH NEEDLE LOCALIZATION;  Surgeon: Ancil Linsey, MD;  Location: [facility];  Service: General;  Laterality: Left;   CARPAL TUNNEL RELEASE Bilateral    cryotherapy     DILATION AND CURETTAGE OF UTERUS     ENDOMETRIAL ABLATION     LAPAROSCOPIC OOPHERECTOMY Left    unsure which side but thinks its the left   LYSIS OF ADHESION Left 10/14/2015   Procedure: LYSIS OF ADHESION;  Surgeon: Juanell Fairly, MD;  Location: [facility];  Service: Orthopedics;  Laterality: Left;   NECK SURGERY     lower neck fusion rods and screws   OOPHORECTOMY     one ovary removed    PORTA CATH INSERTION N/A 03/08/2017   Procedure: PORTA CATH INSERTION;  Surgeon: Renford Dills, MD;  Location: [facility];  Service: Cardiovascular;  Laterality: N/A;   RESECTION DISTAL CLAVICAL Left 10/14/2015   Procedure: RESECTION DISTAL CLAVICAL;  Surgeon: Juanell Fairly, MD;  Location: [facility];  Service: Orthopedics;  Laterality: Left;   SENTINEL NODE BIOPSY Left 02/17/2017   Procedure: SENTINEL NODE BIOPSY;  Surgeon: Ancil Linsey, MD;  Location: [facility];  Service: General;   Laterality: Left;   SHOULDER ARTHROSCOPY WITH OPEN ROTATOR CUFF REPAIR Left 10/14/2015   Procedure: SHOULDER ARTHROSCOPY WITH OPEN ROTATOR CUFF REPAIR;  Surgeon: Juanell Fairly, MD;  Location: [facility];  Service: Orthopedics;  Laterality: Left;   SHOULDER ARTHROSCOPY WITH OPEN ROTATOR CUFF REPAIR AND DISTAL CLAVICLE ACROMINECTOMY Right 09/03/2014   Procedure: RIGHT SHOULDER ARTHROSCOPY WITH MINI OPEN ROTATOR CUFF TEAR;  Surgeon: Juanell Fairly, MD;  Location: [facility];  Service: Orthopedics;  Laterality: Right;  biceps tenodesis, arthroscopic subacromial decompression and distal clavicle incision   spinal injections     SUBACROMIAL DECOMPRESSION Left 10/14/2015   Procedure: SUBACROMIAL DECOMPRESSION;  Surgeon: Juanell Fairly, MD;  Location: [facility];  Service: Orthopedics;  Laterality: Left;   TEE WITHOUT CARDIOVERSION N/A 02/22/2021   Procedure: TRANSESOPHAGEAL ECHOCARDIOGRAM (TEE);  Surgeon: Lamar Blinks, MD;  Location: [facility];  Service: Cardiovascular;  Laterality: N/A;    Home Medications:  Allergies as of 02/08/2023       Reactions   Iodine    Bee Venom Hives, Rash   Cefoxitin Rash   Cefuroxime Axetil Hives, Rash   Cucumber Extract Rash   Gadolinium Derivatives Swelling, Other (See Comments), Rash   NECK BECAME RED AND TONGUE WAS SWELLING SLIGHTLY PER PT NECK BECAME RED AND TONGUE WAS SWELLING SLIGHTLY PER PT NECK BECAME RED AND TONGUE WAS SWELLING SLIGHTLY PER PT   Iodinated Contrast Media Hives, Rash, Other (See Comments)   Latex Rash   Other Hives, Rash   Bee Stings-swelling/hives/rash Wool (textile fiber)-Rash/itching   Tomato Rash, Other (See Comments)   Red tomatoes        Medication List        Accurate as of February 08, 2023  2:21 PM. If you have any questions, ask your nurse or doctor.          albuterol 108 (90 Base) MCG/ACT inhaler Commonly known as: VENTOLIN HFA Inhale 1-2 puffs into the lungs every 6 (six) hours as needed for wheezing or shortness  of breath.   baclofen 10 MG tablet Commonly known as: LIORESAL Take 10 mg by mouth 3 (three) times daily as needed for muscle spasms (typically 2-3 times daily).   benzoyl peroxide-erythromycin gel Commonly known as: Benzamycin Apply to affected area 2 times daily   calcium carbonate 600 MG Tabs tablet Commonly known as: OS-CAL Take by mouth.   cholecalciferol 1000 units tablet Commonly known as: VITAMIN D Take 1,000 Units by mouth 2 (two) times daily.   clopidogrel 75 MG tablet Commonly known as: PLAVIX Take 1 tablet (75 mg total) by mouth daily.   EPINEPHrine 0.3 mg/0.3 mL Soaj injection Commonly known as: EPI-PEN Inject 0.3 mg into the muscle as needed for anaphylaxis.   Hair Skin and Nails Formula Tabs Take 2 tablets by mouth daily.   Narcan 4  MG/0.1ML Liqd nasal spray kit Generic drug: naloxone   OXYCODONE HCL PO Take 15 mg by mouth as needed. Take 4-6 times a day as needed.   phytonadione 2 MG/ML Soln oral solution Commonly known as: VITAMIN K Take 1 mg by mouth once.   pregabalin 25 MG capsule Commonly known as: LYRICA Take 1 capsule by mouth 2 (two) times daily.   promethazine 25 MG tablet Commonly known as: PHENERGAN Take 1 tablet (25 mg total) by mouth every 6 (six) hours as needed for nausea or vomiting.   rizatriptan 10 MG disintegrating tablet Commonly known as: MAXALT-MLT Take 10 mg by mouth as needed for migraine. May repeat in 2 hours if needed   rOPINIRole 1 MG tablet Commonly known as: REQUIP Take 1 tablet (1 mg total) by mouth at bedtime.   rosuvastatin 40 MG tablet Commonly known as: CRESTOR Take 1 tablet (40 mg total) by mouth at bedtime.   topiramate 100 MG tablet Commonly known as: TOPAMAX Take 200 mg by mouth daily.   trazodone 300 MG tablet Commonly known as: DESYREL Take 1 tablet (300 mg total) by mouth at bedtime.   vitamin E 200 UNIT capsule Take 200 Units by mouth daily.        Allergies:  Allergies  Allergen  Reactions   Iodine    Bee Venom Hives and Rash   Cefoxitin Rash   Cefuroxime Axetil Hives and Rash   Cucumber Extract Rash   Gadolinium Derivatives Swelling, Other (See Comments) and Rash    NECK BECAME RED AND TONGUE WAS SWELLING SLIGHTLY PER PT NECK BECAME RED AND TONGUE WAS SWELLING SLIGHTLY PER PT NECK BECAME RED AND TONGUE WAS SWELLING SLIGHTLY PER PT   Iodinated Contrast Media Hives, Rash and Other (See Comments)   Latex Rash   Other Hives and Rash    Bee Stings-swelling/hives/rash Wool (textile fiber)-Rash/itching   Tomato Rash and Other (See Comments)    Red tomatoes    Family History: Family History  Problem Relation Age of Onset   Breast cancer Mother 24       Glioblastoma also; deceased at 55   Diabetes Father    Lung cancer Father        mesothelioma; deceased 31s   Cancer Maternal Aunt        "bone ca"; unk. primary   Colon cancer Maternal Grandfather        dx in 28s; deceased 17   Colon cancer Maternal Aunt        dx in 42s; currently 57s   Lung cancer Maternal Grandmother        smoker; deceased 68   Lung cancer Paternal Grandmother        smoker; deceased 34s   Breast cancer Cousin        dx 62s; daughter of mat aunt with unk. primary cancer   Cancer Other        distant cousin; unknown primary   Colon cancer Other        dx 49s; currently 68; maternal half-sister   Bipolar disorder Sister    Schizophrenia Sister     Social History:  reports that she has been smoking cigarettes. She started smoking about 51 years ago. She has a 51 pack-year smoking history. She has never used smokeless tobacco. She reports that she does not drink alcohol and does not use drugs.   Physical Exam: BP 112/75   Pulse 92   Ht 5\' 3"  (1.6 m)  Wt 152 lb (68.9 kg)   LMP 08/24/2008   BMI 26.93 kg/m   Constitutional:  Alert and oriented, No acute distress. HEENT: [place] AT, moist mucus membranes.  Trachea midline, no masses. Neurologic: Grossly intact, no focal deficits,  moving all 4 extremities. Psychiatric: Normal mood and affect.  Pertinent Imaging: EXAM: CT CHEST WITHOUT CONTRAST LOW-DOSE FOR LUNG CANCER SCREENING   TECHNIQUE: Multidetector CT imaging of the chest was performed following the standard protocol without IV contrast.   RADIATION DOSE REDUCTION: This exam was performed according to the departmental dose-optimization program which includes automated exposure control, adjustment of the mA and/or kV according to patient size and/or use of iterative reconstruction technique.   COMPARISON:  None Available.   FINDINGS: Cardiovascular: Atherosclerotic calcification of the aorta with age advanced involvement of all 3 coronary arteries. Heart size normal. No pericardial effusion.   Mediastinum/Nodes: No pathologically enlarged mediastinal or axillary lymph nodes. Hilar regions are difficult to definitively evaluate without IV contrast. Esophagus is grossly unremarkable.   Lungs/Pleura: Centrilobular and paraseptal emphysema. Smoking related respiratory bronchiolitis. No suspicious pulmonary nodules. No pleural fluid. Airway is unremarkable.   Upper Abdomen: 11 mm left renal stone. Visualized portions of the liver, gallbladder, adrenal glands, kidneys, spleen, pancreas, stomach and bowel are otherwise grossly unremarkable. No upper abdominal adenopathy.   Musculoskeletal: Degenerative changes in the spine.   IMPRESSION: 1. Lung-RADS 1, negative. Continue annual screening with low-dose chest CT without contrast in 12 months. 2. Age advanced three-vessel coronary artery calcification. 3. Left renal stone. 4.  Aortic atherosclerosis (ICD10-I70.0). 5.  Emphysema (ICD10-J43.9).     Electronically Signed   By: Leanna Battles M.D.   On: 01/13/2023 08:39  This was personally reviewed and I agree with the radiologic interpretation.    Assessment & Plan:    1. Left renal stone - Stone is non-obstructing and likely not the cause  of the current pain. It measures approximately 8mm, which is not typically passable. - Plan to obtain a KUB to assess for additional stones and determine the appropriate treatment approach. - Discussed potential treatment options: shockwave lithotripsy if only one stone is present, or ureteroscopy if multiple stones are found. - Provided the patient with a lithotripsy information packet for review. - Will coordinate with her cardiologist regarding holding blood thinners prior to any procedure - Pre-op urine culture - Lithotripsy packet was provided just in case. Moving forward with procedure is dependent on KUB results. - We discussed various treatment options for urolithiasis including observation with or without medical expulsive therapy, shockwave lithotripsy (SWL), ureteroscopy and laser lithotripsy with stent placement, and percutaneous nephrolithotomy.   We discussed that management is based on stone size, location, density, patient co-morbidities, and patient preference.    Stones <63mm in size have a >80% spontaneous passage rate. Data surrounding the use of tamsulosin for medical expulsive therapy is controversial, but meta analyses suggests it is most efficacious for distal stones between 5-39mm in size. Possible side effects include dizziness/lightheadedness, and retrograde ejaculation.   SWL has a lower stone free rate in a single procedure, but also a lower complication rate compared to ureteroscopy and avoids a stent and associated stent related symptoms. Possible complications include renal hematoma, steinstrasse, and need for additional treatment. We discussed the role of his increased skin to stone distance can lead to decreased efficacy with shockwave lithotripsy.   Ureteroscopy with laser lithotripsy and stent placement has a higher stone free rate than SWL in a single procedure, however increased  complication rate including possible infection, ureteral injury, bleeding, and stent  related morbidity. Common stent related symptoms include dysuria, urgency/frequency, and flank pain.   2. Left lower back pain - Suspected to be musculoskeletal in origin, possibly related to her history of degenerative joint disease. - Pain is not typical for kidney stone-related pain, as it is located in the lower back and not associated with the stone's position. - No immediate intervention planned; will reassess following imaging results.  Will call / message with KUB results and and book appropriate surgery.  Agreed that if solitary stone, prefers shockwave and if multiple stones, ureteroscopy.  I have reviewed the above documentation for accuracy and completeness, and I agree with the above.   Briana Scotland, MD   [facility] [address], [address] [place], [place] [address] [phone]

## 2023-02-08 NOTE — Telephone Encounter (Signed)
 Patient called in c/o chills, cough, congestion, fatigue, eyes watery, runny nose. Started a week ago. Negative covid test. Taking Mucinex and Delsym OTC with no relief. Can we send something in?  Karin Golden - [place]

## 2023-02-09 ENCOUNTER — Encounter: Payer: Self-pay | Admitting: Urology

## 2023-02-09 ENCOUNTER — Other Ambulatory Visit: Payer: Self-pay | Admitting: Cardiology

## 2023-02-09 DIAGNOSIS — N200 Calculus of kidney: Secondary | ICD-10-CM

## 2023-02-09 LAB — URINALYSIS, COMPLETE
Bilirubin, UA: NEGATIVE
Glucose, UA: NEGATIVE
Ketones, UA: NEGATIVE
Nitrite, UA: NEGATIVE
Specific Gravity, UA: 1.02 (ref 1.005–1.030)
Urobilinogen, Ur: 1 mg/dL (ref 0.2–1.0)
pH, UA: 7 (ref 5.0–7.5)

## 2023-02-09 LAB — MICROSCOPIC EXAMINATION

## 2023-02-09 MED ORDER — DOXYCYCLINE HYCLATE 100 MG PO TABS: 100.0000 mg | ORAL_TABLET | Freq: Two times a day (BID) | ORAL | 0 refills | Status: AC

## 2023-02-10 ENCOUNTER — Other Ambulatory Visit: Payer: Self-pay

## 2023-02-10 DIAGNOSIS — N200 Calculus of kidney: Secondary | ICD-10-CM

## 2023-02-10 LAB — CULTURE, URINE COMPREHENSIVE

## 2023-02-10 NOTE — Progress Notes (Signed)
 ESWL ORDER FORM  Dr. Glendia Barba, MD  Expected date of procedure: 02/16/23  Surgeon: assigned  Post op standing: 2-4wk follow up w/KUB prior  Anticoagulation/Aspirin /NSAID standing order: Hold all 72 hours prior  Anesthesia standing order: MAC  VTE standing: SCD's  Dx: Left Nephrolithiasis  Procedure: left Extracorporeal shock wave lithotripsy  CPT : 50590  Standing Order Set:   *NPO after mn, KUB  *NS 176ml/hr, Keflex  500mg  PO, Benadryl  25mg  PO, Valium  10mg  PO, Zofran  4mg  IV  Medications if other than standing orders:   NONE

## 2023-02-10 NOTE — Progress Notes (Signed)
    [facility] ESWL POSTING SHEET        Patient Name: Briana [name]  DOB: 08/25/1965  MRN: [id number]  Surgeon:  Glendia Barba, MD  Diagnosis:  Left Nephrolithiasis  CPT: 49409  ESWL DATE: 02/16/2023  ESWL TIME: 0730am  Surgery:  Left Extracorporeal Shock Wave Lithotripsy  Special Needs/Requirements: no       Cardiac/Medical/Pulmonary Clearance needed: No       Form Faxed to [facility]- [phone] Date:   Date: 02/10/23       Form Faxed to [place]- [phone]  Date:  Date: 02/10/23           Copy Made for Insurance PA:  Date: 02/10/23       Orders Entered in to Epic:  Date: 02/10/23

## 2023-02-13 NOTE — Progress Notes (Signed)
  Phone Number: [phone] for Surgical Coordinator Fax Number: [phone]  REQUEST FOR SURGICAL CLEARANCE       Date: 02/13/2023  Faxed to: Dr. Florencio  Surgeon: Dr. Rosina Riis, MD     Date of Surgery: 01/23/025  Operation: Left Extracorporeal Shock Wave Lithotripsy   Anesthesia Type: Moderate   Diagnosis: Left Nephrolithasis   Patient Requires:   Cardiac / Vascular Clearance : Yes  Reason: Patient will need to hold Plavix  for 5 days prior to surgery  Risk Assessment:    Low   []       Moderate   []     High   []           This patient is optimized for surgery  YES []       NO   []    I recommend further assessment/workup prior to surgery. YES []      NO  []   Appointment scheduled for: _______________________   Further recommendations: ____________________________________     Physician Signature:__________________________________   Printed Name: ________________________________________   Date: _________________

## 2023-02-17 ENCOUNTER — Telehealth: Payer: Self-pay

## 2023-02-17 NOTE — Telephone Encounter (Signed)
Surgical Clearance received. Spoke with pt. Pt. Advised to hold Plavix for 5 days prior to surgery. She will not take any more Plavix after today. Clearance from Dr. Juliann Pares scanned in Media.

## 2023-02-20 ENCOUNTER — Encounter: Payer: Self-pay | Admitting: Cardiology

## 2023-02-20 ENCOUNTER — Ambulatory Visit (INDEPENDENT_AMBULATORY_CARE_PROVIDER_SITE_OTHER): Payer: 59 | Admitting: Cardiology

## 2023-02-20 VITALS — BP 122/60 | HR 123 | Ht 63.0 in | Wt 151.0 lb

## 2023-02-20 DIAGNOSIS — Z0130 Encounter for examination of blood pressure without abnormal findings: Secondary | ICD-10-CM

## 2023-02-20 DIAGNOSIS — E782 Mixed hyperlipidemia: Secondary | ICD-10-CM | POA: Diagnosis not present

## 2023-02-20 DIAGNOSIS — Z1211 Encounter for screening for malignant neoplasm of colon: Secondary | ICD-10-CM | POA: Diagnosis not present

## 2023-02-20 DIAGNOSIS — R7303 Prediabetes: Secondary | ICD-10-CM | POA: Diagnosis not present

## 2023-02-20 NOTE — Progress Notes (Signed)
Established Patient Office Visit  Subjective:  Patient ID: Briana [name], female    DOB: [birth year]  Age: 58 y.o. MRN: [id number]  Chief Complaint  Patient presents with   Follow-up    4 Months Follow Up    Patient in office for 4 month follow up, did not have blood work done. Patient fasting, will get blood work today. Patient doing well overall. No new complaints today. Scheduled for a lithotripsy on 02/23/23.  Patient due for colonoscopy this year, will send referral.  Continue same medications.     No other concerns at this time.   Past Medical History:  Diagnosis Date   Abnormal Pap smear of cervix    01/2015 ascus/neg- 04/2015 ascus/neg   Allergy    Anxiety    Arthritis    Asthma    Breast cancer (HCC) 2019   Chronic kidney disease    COPD (chronic obstructive pulmonary disease) (HCC)    DDD (degenerative disc disease), lumbar    Depression    Elevated lipids    Fibromyalgia    Fibromyalgia    Genetic testing 03/09/2017   Multi-Cancer panel (83 genes) @ [facility] - No pathogenic mutations detected   GERD (gastroesophageal reflux disease)    History of IBS    Hyperthyroidism    Joint disease    Migraine    Migraine    Oxygen deficiency    Personal history of chemotherapy    Personal history of radiation therapy    Restless leg syndrome    Sleep apnea    Does not use C-PAP, cannot tolerate mask    Past Surgical History:  Procedure Laterality Date   ABDOMINAL HYSTERECTOMY  2008   BREAST BIOPSY Left 02/02/2017   [place] core positive   BREAST LUMPECTOMY Left 02/17/2017   BREAST LUMPECTOMY WITH NEEDLE LOCALIZATION Left 02/17/2017   Procedure: BREAST LUMPECTOMY WITH NEEDLE LOCALIZATION;  Surgeon: Ancil Linsey, MD;  Location: [facility];  Service: General;  Laterality: Left;   CARPAL TUNNEL RELEASE Bilateral    cryotherapy     DILATION AND CURETTAGE OF UTERUS     ENDOMETRIAL ABLATION     LAPAROSCOPIC OOPHERECTOMY Left    unsure which side but thinks its the  left   LYSIS OF ADHESION Left 10/14/2015   Procedure: LYSIS OF ADHESION;  Surgeon: Juanell Fairly, MD;  Location: [facility];  Service: Orthopedics;  Laterality: Left;   NECK SURGERY     lower neck fusion rods and screws   OOPHORECTOMY     one ovary removed    PORTA CATH INSERTION N/A 03/08/2017   Procedure: PORTA CATH INSERTION;  Surgeon: Renford Dills, MD;  Location: [facility];  Service: Cardiovascular;  Laterality: N/A;   RESECTION DISTAL CLAVICAL Left 10/14/2015   Procedure: RESECTION DISTAL CLAVICAL;  Surgeon: Juanell Fairly, MD;  Location: [facility];  Service: Orthopedics;  Laterality: Left;   SENTINEL NODE BIOPSY Left 02/17/2017   Procedure: SENTINEL NODE BIOPSY;  Surgeon: Ancil Linsey, MD;  Location: [facility];  Service: General;  Laterality: Left;   SHOULDER ARTHROSCOPY WITH OPEN ROTATOR CUFF REPAIR Left 10/14/2015   Procedure: SHOULDER ARTHROSCOPY WITH OPEN ROTATOR CUFF REPAIR;  Surgeon: Juanell Fairly, MD;  Location: [facility];  Service: Orthopedics;  Laterality: Left;   SHOULDER ARTHROSCOPY WITH OPEN ROTATOR CUFF REPAIR AND DISTAL CLAVICLE ACROMINECTOMY Right 09/03/2014   Procedure: RIGHT SHOULDER ARTHROSCOPY WITH MINI OPEN ROTATOR CUFF TEAR;  Surgeon: Juanell Fairly, MD;  Location: [facility];  Service: Orthopedics;  Laterality: Right;  biceps tenodesis, arthroscopic subacromial decompression and distal clavicle incision   spinal injections     SUBACROMIAL DECOMPRESSION Left 10/14/2015   Procedure: SUBACROMIAL DECOMPRESSION;  Surgeon: Juanell Fairly, MD;  Location: [facility];  Service: Orthopedics;  Laterality: Left;   TEE WITHOUT CARDIOVERSION N/A 02/22/2021   Procedure: TRANSESOPHAGEAL ECHOCARDIOGRAM (TEE);  Surgeon: Lamar Blinks, MD;  Location: [facility];  Service: Cardiovascular;  Laterality: N/A;    Social History   Socioeconomic History   Marital status: Divorced    Spouse name: Not on file   Number of children: 1   Years of education: Not on file    Highest education level: High school graduate  Occupational History    Comment: disabled  Tobacco Use   Smoking status: Every Day    Current packs/day: 1.00    Average packs/day: 1 pack/day for 51.1 years (51.1 ttl pk-yrs)    Types: Cigarettes    Start date: 1974   Smokeless tobacco: Never  Vaping Use   Vaping status: Never Used  Substance and Sexual Activity   Alcohol use: No    Alcohol/week: 0.0 standard drinks of alcohol   Drug use: No   Sexual activity: Yes  Other Topics Concern   Not on file  Social History Narrative   Not on file   Social Drivers of Health   Financial Resource Strain: High Risk (04/26/2017)   Overall Financial Resource Strain (CARDIA)    Difficulty of Paying Living Expenses: Very hard  Food Insecurity: Food Insecurity Present (04/26/2017)   Hunger Vital Sign    Worried About Running Out of Food in the Last Year: Often true    Ran Out of Food in the Last Year: Often true  Transportation Needs: No Transportation Needs (04/26/2017)   PRAPARE - Administrator, Civil Service (Medical): No    Lack of Transportation (Non-Medical): No  Physical Activity: Inactive (04/26/2017)   Exercise Vital Sign    Days of Exercise per Week: 0 days    Minutes of Exercise per Session: 0 min  Stress: Stress Concern Present (04/26/2017)   [employer] of Occupational Health - Occupational Stress Questionnaire    Feeling of Stress : Very much  Social Connections: Unknown (04/26/2017)   Social Connection and Isolation Panel [NHANES]    Frequency of Communication with Friends and Family: Not on file    Frequency of Social Gatherings with Friends and Family: Not on file    Attends Religious Services: Never    Database administrator or Organizations: No    Attends Banker Meetings: Never    Marital Status: Divorced  Catering manager Violence: Not At Risk (04/26/2017)   Humiliation, Afraid, Rape, and Kick questionnaire    Fear of Current or Ex-Partner:  No    Emotionally Abused: No    Physically Abused: No    Sexually Abused: No    Family History  Problem Relation Age of Onset   Breast cancer Mother 32       Glioblastoma also; deceased at 61   Diabetes Father    Lung cancer Father        mesothelioma; deceased 53s   Cancer Maternal Aunt        "bone ca"; unk. primary   Colon cancer Maternal Grandfather        dx in 14s; deceased 49   Colon cancer Maternal Aunt        dx in 37s; currently 50s  Lung cancer Maternal Grandmother        smoker; deceased 29   Lung cancer Paternal Grandmother        smoker; deceased 11s   Breast cancer Cousin        dx 44s; daughter of mat aunt with unk. primary cancer   Cancer Other        distant cousin; unknown primary   Colon cancer Other        dx 53s; currently 93; maternal half-sister   Bipolar disorder Sister    Schizophrenia Sister     Allergies  Allergen Reactions   Iodine    Bee Venom Hives and Rash   Cefoxitin Rash   Cefuroxime Axetil Hives and Rash   Cucumber Extract Rash   Gadolinium Derivatives Swelling, Other (See Comments) and Rash    NECK BECAME RED AND TONGUE WAS SWELLING SLIGHTLY PER PT NECK BECAME RED AND TONGUE WAS SWELLING SLIGHTLY PER PT NECK BECAME RED AND TONGUE WAS SWELLING SLIGHTLY PER PT   Iodinated Contrast Media Hives, Rash and Other (See Comments)   Latex Rash   Other Hives and Rash    Bee Stings-swelling/hives/rash Wool (textile fiber)-Rash/itching   Tomato Rash and Other (See Comments)    Red tomatoes    Outpatient Medications Prior to Visit  Medication Sig   albuterol (VENTOLIN HFA) 108 (90 Base) MCG/ACT inhaler Inhale 1-2 puffs into the lungs every 6 (six) hours as needed for wheezing or shortness of breath.   baclofen (LIORESAL) 10 MG tablet Take 10 mg by mouth 3 (three) times daily as needed for muscle spasms (typically 2-3 times daily).    benzoyl peroxide-erythromycin (BENZAMYCIN) gel Apply to affected area 2 times daily   calcium carbonate  (OS-CAL) 600 MG TABS tablet Take by mouth.   cholecalciferol (VITAMIN D) 1000 UNITS tablet Take 1,000 Units by mouth 2 (two) times daily.    clopidogrel (PLAVIX) 75 MG tablet Take 1 tablet (75 mg total) by mouth daily.   EPINEPHrine 0.3 mg/0.3 mL IJ SOAJ injection Inject 0.3 mg into the muscle as needed for anaphylaxis.   Multiple Vitamins-Minerals (HAIR SKIN AND NAILS FORMULA) TABS Take 2 tablets by mouth daily.   NARCAN 4 MG/0.1ML LIQD nasal spray kit    OXYCODONE HCL PO Take 15 mg by mouth as needed. Take 4-6 times a day as needed.   phytonadione (VITAMIN K) 2 MG/ML SOLN oral solution Take 1 mg by mouth once.   pregabalin (LYRICA) 25 MG capsule Take 1 capsule by mouth 2 (two) times daily.   promethazine (PHENERGAN) 25 MG tablet Take 1 tablet (25 mg total) by mouth every 6 (six) hours as needed for nausea or vomiting.   rizatriptan (MAXALT-MLT) 10 MG disintegrating tablet Take 10 mg by mouth as needed for migraine. May repeat in 2 hours if needed   rOPINIRole (REQUIP) 1 MG tablet Take 1 tablet (1 mg total) by mouth at bedtime.   rosuvastatin (CRESTOR) 40 MG tablet Take 1 tablet (40 mg total) by mouth at bedtime.   topiramate (TOPAMAX) 100 MG tablet Take 200 mg by mouth daily.    trazodone (DESYREL) 300 MG tablet Take 1 tablet (300 mg total) by mouth at bedtime.   vitamin E 200 UNIT capsule Take 200 Units by mouth daily.   No facility-administered medications prior to visit.    Review of Systems  Constitutional: Negative.   HENT: Negative.    Eyes: Negative.   Respiratory: Negative.  Negative for shortness of breath.  Cardiovascular: Negative.  Negative for chest pain.  Gastrointestinal: Negative.  Negative for abdominal pain, constipation and diarrhea.  Genitourinary: Negative.   Musculoskeletal:  Negative for joint pain and myalgias.  Skin: Negative.   Neurological: Negative.  Negative for dizziness and headaches.  Endo/Heme/Allergies: Negative.   All other systems reviewed and  are negative.      Objective:   BP 122/60   Pulse (!) 123   Ht 5\' 3"  (1.6 m)   Wt 151 lb (68.5 kg)   LMP 08/24/2008   SpO2 96%   BMI 26.75 kg/m   Vitals:   02/20/23 1310  BP: 122/60  Pulse: (!) 123  Height: 5\' 3"  (1.6 m)  Weight: 151 lb (68.5 kg)  SpO2: 96%  BMI (Calculated): 26.76    Physical Exam Vitals and nursing note reviewed.  Constitutional:      Appearance: Normal appearance. She is normal weight.  HENT:     Head: Normocephalic and atraumatic.     Nose: Nose normal.     Mouth/Throat:     Mouth: Mucous membranes are moist.  Eyes:     Extraocular Movements: Extraocular movements intact.     Conjunctiva/sclera: Conjunctivae normal.     Pupils: Pupils are equal, round, and reactive to light.  Cardiovascular:     Rate and Rhythm: Normal rate and regular rhythm.     Pulses: Normal pulses.     Heart sounds: Normal heart sounds.  Pulmonary:     Effort: Pulmonary effort is normal.     Breath sounds: Normal breath sounds.  Abdominal:     General: Abdomen is flat. Bowel sounds are normal.     Palpations: Abdomen is soft.  Musculoskeletal:        General: Normal range of motion.     Cervical back: Normal range of motion.  Skin:    General: Skin is warm and dry.  Neurological:     General: No focal deficit present.     Mental Status: She is alert and oriented to person, place, and time.  Psychiatric:        Mood and Affect: Mood normal.        Behavior: Behavior normal.        Thought Content: Thought content normal.        Judgment: Judgment normal.      No results found for any visits on 02/20/23.  Recent Results (from the past 2160 hours)  Allergen Profile, Mold     Status: None   Collection Time: 12/05/22 11:06 AM  Result Value Ref Range   Class Description Allergens Comment     Comment:     Levels of Specific IgE       Class  Description of Class     ---------------------------  -----  --------------------                    < 0.10         0          Negative            0.10 -    0.31         0/I       Equivocal/Low            0.32 -    0.55         I         Low            0.56 -  1.40         II        Moderate            1.41 -    3.90         III       High            3.91 -   19.00         IV        Very High           19.01 -  100.00         V         Very High                   >100.00         VI        Very High    Penicillium Chrysogen IgE <0.10 Class 0 kU/L   Cladosporium Herbarum IgE <0.10 Class 0 kU/L   Aspergillus Fumigatus IgE <0.10 Class 0 kU/L   Mucor Racemosus IgE <0.10 Class 0 kU/L   Candida Albicans IgE <0.10 Class 0 kU/L   Alternaria Alternata IgE <0.10 Class 0 kU/L   Setomelanomma Rostrat <0.10 Class 0 kU/L   M009-IgE Fusarium proliferatum <0.10 Class 0 kU/L   Stemphylium Herbarum IgE <0.10 Class 0 kU/L   Aureobasidi Pullulans IgE <0.10 Class 0 kU/L   Phoma Betae IgE <0.10 Class 0 kU/L   M014-IgE Epicoccum purpur <0.10 Class 0 kU/L  Urinalysis, Complete     Status: Abnormal   Collection Time: 02/08/23  1:55 PM  Result Value Ref Range   Specific Gravity, UA 1.020 1.005 - 1.030   pH, UA 7.0 5.0 - 7.5   Color, UA Yellow Yellow   Appearance Ur Cloudy (A) Clear   Leukocytes,UA Trace (A) Negative   Protein,UA 1+ (A) Negative/Trace   Glucose, UA Negative Negative   Ketones, UA Negative Negative   RBC, UA 1+ (A) Negative   Bilirubin, UA Negative Negative   Urobilinogen, Ur 1.0 0.2 - 1.0 mg/dL   Nitrite, UA Negative Negative   Microscopic Examination See below:   Microscopic Examination     Status: Abnormal   Collection Time: 02/08/23  1:55 PM   Urine  Result Value Ref Range   WBC, UA 6-10 (A) 0 - 5 /hpf   RBC, Urine 3-10 (A) 0 - 2 /hpf   Epithelial Cells (non renal) 0-10 0 - 10 /hpf   Bacteria, UA Moderate (A) None seen/Few  CULTURE, URINE COMPREHENSIVE     Status: None   Collection Time: 02/08/23  2:56 PM   Specimen: Urine   UR  Result Value Ref Range   Urine Culture, Comprehensive Final  report    Organism ID, Bacteria Comment     Comment: Mixed urogenital flora 50,000-100,000 colony forming units per mL       Assessment & Plan:  Referral sent to GI for colonoscopy. Fasting lab work today.  May proceed with lithotripsy.   Problem List Items Addressed This Visit       Other   HLD (hyperlipidemia) - Primary   Relevant Orders   Lipid Profile   Prediabetes   Relevant Orders   CMP14+EGFR   Hemoglobin A1c   Other Visit Diagnoses       Colon cancer screening       Relevant Orders   Ambulatory referral to Gastroenterology       Return  in about 4 months (around 06/20/2023).   Total time spent: 25 minutes  Google, NP  02/20/2023   This document may have been prepared by Dragon Voice Recognition software and as such may include unintentional dictation errors.

## 2023-02-21 ENCOUNTER — Other Ambulatory Visit: Payer: Self-pay

## 2023-02-21 ENCOUNTER — Encounter
Admission: RE | Admit: 2023-02-21 | Discharge: 2023-02-21 | Disposition: A | Discharge status: Home / Self Care | Payer: 59 | Source: Ambulatory Visit | Attending: Urology | Admitting: Urology

## 2023-02-21 DIAGNOSIS — I639 Cerebral infarction, unspecified: Secondary | ICD-10-CM

## 2023-02-21 DIAGNOSIS — Z01812 Encounter for preprocedural laboratory examination: Secondary | ICD-10-CM

## 2023-02-21 HISTORY — DX: Emphysema, unspecified: J43.9

## 2023-02-21 HISTORY — DX: Atherosclerosis of aorta: I70.0

## 2023-02-21 HISTORY — DX: Cerebral infarction, unspecified: I63.9

## 2023-02-21 LAB — HEMOGLOBIN A1C
Est. average glucose Bld gHb Est-mCnc: 117 mg/dL
Hgb A1c MFr Bld: 5.7 % — ABNORMAL HIGH (ref 4.8–5.6)

## 2023-02-21 LAB — CMP14+EGFR
ALT: 8 [IU]/L (ref 0–32)
AST: 13 [IU]/L (ref 0–40)
Albumin: 3.9 g/dL (ref 3.8–4.9)
Alkaline Phosphatase: 103 [IU]/L (ref 44–121)
BUN/Creatinine Ratio: 14 (ref 9–23)
BUN: 17 mg/dL (ref 6–24)
Bilirubin Total: 0.2 mg/dL (ref 0.0–1.2)
CO2: 21 mmol/L (ref 20–29)
Calcium: 9.5 mg/dL (ref 8.7–10.2)
Chloride: 103 mmol/L (ref 96–106)
Creatinine, Ser: 1.24 mg/dL — ABNORMAL HIGH (ref 0.57–1.00)
Globulin, Total: 2.6 g/dL (ref 1.5–4.5)
Glucose: 88 mg/dL (ref 70–99)
Potassium: 4.6 mmol/L (ref 3.5–5.2)
Sodium: 138 mmol/L (ref 134–144)
Total Protein: 6.5 g/dL (ref 6.0–8.5)
eGFR: 50 mL/min/{1.73_m2} — ABNORMAL LOW (ref >59)

## 2023-02-21 LAB — LIPID PANEL
Chol/HDL Ratio: 2.7 {ratio} (ref 0.0–4.4)
Cholesterol, Total: 110 mg/dL (ref 100–199)
HDL: 41 mg/dL (ref >39)
LDL Chol Calc (NIH): 39 mg/dL (ref 0–99)
Triglycerides: 181 mg/dL — ABNORMAL HIGH (ref 0–149)
VLDL Cholesterol Cal: 30 mg/dL (ref 5–40)

## 2023-02-21 NOTE — Patient Instructions (Addendum)
Your procedure is scheduled on: Jan 23 Thursday  Report to the Registration Desk on the 1st floor of the [facility] To find out your arrival time, please call [phone] between 1PM - 3PM on: Jan 22 Wednesday  If your arrival time is 6:00 am, do not arrive before that time as the [facility] entrance doors do not open until 6:00 am.  REMEMBER: Instructions that are not followed completely may result in serious medical risk, up to and including death; or upon the discretion of your surgeon and anesthesiologist your surgery may need to be rescheduled.  Do not eat food after midnight the night before surgery.  No gum chewing or hard candies.   One week prior to surgery: Stop Anti-inflammatories (NSAIDS) such as Advil, Aleve, Ibuprofen, Motrin, Naproxen, Naprosyn and Aspirin based products such as Excedrin, Goody's Powder, BC Powder. Stop ANY OVER THE COUNTER supplements until after surgery.  You may however, continue to take Tylenol if needed for pain up until the day of surgery.   **Follow recommendations regarding stopping blood thinners.**          Please follow instructions given by your surgeon.   Continue taking all of your other prescription medications up until the day of surgery.  ON THE DAY OF SURGERY ONLY TAKE THESE MEDICATIONS WITH SIPS OF WATER:  opiramate (TOPAMAX)  Oxycodone   Use inhalers on the day of surgery and bring to the hospital.  No Alcohol for 24 hours before or after surgery.  No Smoking including e-cigarettes for 24 hours before surgery.  No chewable tobacco products for at least 6 hours before surgery.  No nicotine patches on the day of surgery.  Do not use any "recreational" drugs for at least a week (preferably 2 weeks) before your surgery.  Please be advised that the combination of cocaine and anesthesia may have negative outcomes, up to and including death. If you test positive for cocaine, your surgery will be cancelled.  On the morning  of surgery brush your teeth with toothpaste and water, you may rinse your mouth with mouthwash if you wish. Do not swallow any toothpaste or mouthwash.  Please shower on day of surgery if no contraindication.  Do not wear jewelry, make-up, hairpins, clips or nail polish.  For welded (permanent) jewelry: bracelets, anklets, waist bands, etc.  Please have this removed prior to surgery.  If it is not removed, there is a chance that hospital personnel will need to cut it off on the day of surgery.  Do not wear lotions, powders, or perfumes.   Do not shave body hair from the neck down 48 hours before surgery.  Contact lenses, hearing aids and dentures may not be worn into surgery.  Do not bring valuables to the hospital. [facility] is not responsible for any missing/lost belongings or valuables.    Notify your doctor if there is any change in your medical condition (cold, fever, infection).  Wear comfortable clothing (specific to your surgery type) to the hospital.  After surgery, you can help prevent lung complications by doing breathing exercises.  Take deep breaths and cough every 1-2 hours. Your doctor may order a device called an Incentive Spirometer to help you take deep breaths.  If you are being admitted to the hospital overnight, leave your suitcase in the car. After surgery it may be brought to your room.  In case of increased patient census, it may be necessary for you, the patient, to continue your postoperative care  in the Same Day Surgery department.  If you are being discharged the day of surgery, you will not be allowed to drive home. You will need a responsible individual to drive you home and stay with you for 24 hours after surgery.    Please call the Pre-admissions Testing Dept. at [phone] if you have any questions about these instructions.  Surgery Visitation Policy:  Patients having surgery or a procedure may have two visitors.  Children under the age of  5 must have an adult with them who is not the patient.  Temporary Visitor Restrictions Due to increasing cases of flu, RSV and COVID-19: Children ages 79 and under will not be able to visit patients in [facility] hospitals under most circumstances.  Inpatient Visitation:    Visiting hours are 7 a.m. to 8 p.m. Up to four visitors are allowed at one time in a patient room. The visitors may rotate out with other people during the day.  One visitor age 36 or older may stay with the patient overnight and must be in the room by 8 p.m.

## 2023-02-21 NOTE — Progress Notes (Signed)
Informed via Mychart message

## 2023-02-21 NOTE — Addendum Note (Signed)
Addended by: Letta Kocher A on: 02/21/2023 04:18 PM   Modules accepted: Orders

## 2023-02-22 ENCOUNTER — Encounter
Admission: RE | Admit: 2023-02-22 | Discharge: 2023-02-22 | Disposition: A | Discharge status: Home / Self Care | Payer: 59 | Source: Ambulatory Visit | Attending: Urology | Admitting: Urology

## 2023-02-22 ENCOUNTER — Encounter: Payer: Self-pay | Admitting: Urology

## 2023-02-22 DIAGNOSIS — I639 Cerebral infarction, unspecified: Secondary | ICD-10-CM | POA: Insufficient documentation

## 2023-02-22 DIAGNOSIS — Z01810 Encounter for preprocedural cardiovascular examination: Secondary | ICD-10-CM | POA: Diagnosis not present

## 2023-02-22 DIAGNOSIS — Z01818 Encounter for other preprocedural examination: Secondary | ICD-10-CM | POA: Diagnosis not present

## 2023-02-22 DIAGNOSIS — Z01812 Encounter for preprocedural laboratory examination: Secondary | ICD-10-CM

## 2023-02-22 DIAGNOSIS — I5030 Unspecified diastolic (congestive) heart failure: Secondary | ICD-10-CM

## 2023-02-22 LAB — CBC
HCT: 42.1 % (ref 36.0–46.0)
Hemoglobin: 13.8 g/dL (ref 12.0–15.0)
MCH: 32.2 pg (ref 26.0–34.0)
MCHC: 32.8 g/dL (ref 30.0–36.0)
MCV: 98.4 fL (ref 80.0–100.0)
Platelets: 232 10*3/uL (ref 150–400)
RBC: 4.28 MIL/uL (ref 3.87–5.11)
RDW: 14.5 % (ref 11.5–15.5)
WBC: 9.5 10*3/uL (ref 4.0–10.5)
nRBC: 0 % (ref 0.0–0.2)

## 2023-02-22 NOTE — Progress Notes (Signed)
Perioperative / Anesthesia Services  Pre-Admission Testing Clinical Review / Pre-Operative Anesthesia Consult  Date: 02/22/23  Patient Demographics:  Name: Briana [name] DOB: [birth year] MRN:   [id number]  Planned Surgical Procedure(s):    Case: [id number] Date/Time: 02/23/23 0730   Procedure: EXTRACORPOREAL SHOCK WAVE LITHOTRIPSY (ESWL) (Left)   Anesthesia type: Monitor Anesthesia Care   Pre-op diagnosis: Left Nephrolithiasis   Location: [facility]   Surgeons: [name], MD      NOTE: Available PAT nursing documentation and vital signs have been reviewed. Clinical nursing staff has updated patient's PMH/PSHx, current medication list, and drug allergies/intolerances to ensure comprehensive history available to assist in medical decision making as it pertains to the aforementioned surgical procedure and anticipated anesthetic course. Extensive review of available clinical information personally performed. [place] [facility] and PSHx updated with any diagnoses/procedures that  may have been inadvertently omitted during his intake with the pre-admission testing department's nursing staff.  Clinical Discussion:  Briana [name] is a 58 y.o. female who is submitted for pre-surgical anesthesia review and clearance prior to her undergoing the above procedure. Patient is a Current Smoker (51 pack years). Pertinent PMH includes: CAD, HFpEF, aortic atherosclerosis, multiple CVAs, atrial septal aneurysm with PFO, HTN, HLD, hypothyroidism, CKD-III, COPD, asthma, OSAH (unable to tolerate mask for nocturnal PAP therapy), GERD (no daily Tx), remote LEFT breast cancer, nephrolithiasis, OA, fibromyalgia, cervical and lumbar DDD, chronic pain syndrome, RLS, insomnia, depression, anxiety, PTSD, bipolar disorder.  Patient is followed by cardiology Juliann Pares, MD). She was last seen in the cardiology clinic on 01/12/2023; notes reviewed. At the time of her clinic visit, patient doing well  overall from a cardiovascular perspective. Patient with intermittent episodes of vertiginous symptoms to of the point of being near syncopal. Denies actual syncopal episodes. Patient denied any chest pain, shortness of breath, PND, orthopnea, palpitations, significant peripheral edema, weakness, or fatigue. Patient with a past medical history significant for cardiovascular diagnoses. Documented physical exam was grossly benign, providing no evidence of acute exacerbation and/or decompensation of the patient's known cardiovascular conditions.  TTE performed on 11/08/2017 revealed a normal left ventricular systolic function with a low normal EF of 50%. There were no regional wall motion abnormalities. Right ventricular size and function normal.  There was trivial mitral mild tricuspid valve regurgitation.  RVSP =22.8 mmHg. All transvalvular gradients were noted to be normal providing no evidence suggestive of valvular stenosis. Aorta normal in size with no evidence of ectasia or aneurysmal dilatation.  Patient underwent multiple nuclear medicine MUGA scans due to treatment with systemic cardiotoxic chemotherapy.  Last MUGA scan was performed on 10/04/2017 revealing a mildly decreased left ventricular systolic function with an EF of 45.7%.  There were no regional wall motion abnormalities.  Study stable overall since patient began for chemotherapy treatments.  Myocardial perfusion imaging study was performed on 11/08/2017 revealing a normal left ventricular systolic function with a hyperdynamic LVEF of 69%.  There were no regional wall motion abnormalities.  No artifact or left ventricular cavity size enlargement appreciated on review of imaging. SPECT images demonstrated no evidence of stress-induced myocardial ischemia or arrhythmia; no scintigraphic evidence of scar.  Study determined to be normal and low risk.  Patient has suffered multiple CVAs in the past.  MRI of the brain performed on 01/29/2019  revealed a chronic LEFT basal ganglia lacunar infarct that was determined to be new from prior examination performed and 2014.  MRI imaging of the brain performed on  02/18/2021 demonstrated scattered punctate foci of diffusion involving the LEFT cerebral hemisphere and LEFT cerebral consistent with tiny acute to early subacute ischemic infarcts.  There was no associated hemorrhage or mass effect noted.  Areas of concern felt to be embolic in nature.  Additionally, 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 again demonstrated.   Repeat MRI of the brain was performed on 02/20/2021 revealing an acute subcentimeter infarct in the RIGHT anterior cerebellar vermis that was new compared to the MRI that was performed 2 days prior (02/18/2021).  There was no change in the previously demonstrated multiple small embolic infarcts in the LEFT cerebral hemisphere and LEFT cerebellum.  No changes to the chronic infarct noted in the LEFT anterior basal ganglia. TEE was performed on 02/22/2021. To assess for arrhythmia genic possibility of neurological event.  TEE did demonstrate a PFO by bubble study; (+) IAS observed within 3-6 cardiac cycles. ILR reportedly placed. Interrogations has revealed no evidence of high grade arrhythmias.  Low-dose CT scan of the chest for lung cancer screening performed on 12/21/2022 demonstrated advanced calcification of the aorta with age advanced involvement of all 3 major coronary arteries.  Due to her multiple CVAs, patient remains on daily antithrombotic therapy using clopidogrel.  Patient is reportedly compliant with therapy with no evidence or reports of GI/GU related bleeding.  Blood pressure well controlled at 98/62 mmHg without the need for pharmacological intervention.  Patient is on rosuvastatin for her HLD diagnosis and ASCVD prevention. Patient is not diabetic. She does have an OSAH diagnosis, however she is not able to tolerate the mask required for nocturnal  PAP therapy. Patient is able to complete all of her  ADL/IADLs without cardiovascular limitation.  Per the [facility]AS[facility], patient is able to achieve at least 4 METS of physical activity without experiencing any significant degree of angina/anginal equivalent symptoms. No changes were made to her medication regimen during her visit with cardiology.  Patient scheduled to follow-up with outpatient cardiology in 6 months or sooner if needed.  Briana [name] is scheduled for an elective EXTRACORPOREAL SHOCK WAVE LITHOTRIPSY (ESWL) (Left) on 02/23/2023 with Dr. [name], MD.  Given patient's past medical history significant for cardiovascular diagnoses, presurgical cardiac clearance was sought by the PAT team. Per cardiology, "this patient is optimized for surgery and may proceed with the planned procedural course with a LOW risk of significant perioperative cardiovascular complications".  In review of the patient's chart, it is noted that she is on daily oral antithrombotic therapy. She has been instructed on recommendations for holding her clopidogrel for 5 days prior to her procedure with plans to restart as soon as postoperative bleeding risk felt to be minimized by his attending surgeon. The patient has been instructed that her last dose of clopidogrel should be on 02/17/2023.  Patient denies previous perioperative complications with anesthesia in the past. In review her EMR, it is noted that patient underwent a general anesthetic course here at [facility] ([facility]) in 01/2017 without documented complications.      02/20/2023    1:10 PM 02/08/2023    1:57 PM 11/03/2022    1:56 PM  Vitals with BMI  Height 5\' 3"  5\' 3"  5\' 3"   Weight 151 lbs 152 lbs 151 lbs 13 oz  BMI 26.76 26.93 26.9  Systolic 122 112 86  Diastolic 60 75 67  Pulse 123 92 99   Providers/Specialists:  NOTE: Primary physician provider listed below. Patient may have been  seen by APP or partner within same  practice.   PROVIDER ROLE / SPECIALTY LAST Briana Bitter, MD Urology (Surgeon) 02/08/2023  Marisue Ivan, NP Primary Care Provider 02/20/2023  Rudean Hitt, MD Cardiology 01/12/2023  Gerarda Fraction, MD Medical Oncology 11/03/2022  Carmina Miller, MD Radiation Oncology 03/11/2021   Allergies:   Allergies  Allergen Reactions   Iodine    Bee Venom Hives and Rash   Cefoxitin Rash   Cefuroxime Axetil Hives and Rash   Cucumber Extract Rash   Gadolinium Derivatives Swelling, Rash and Other (See Comments)    NECK BECAME RED AND TONGUE WAS SWELLING SLIGHTLY PER PT   Iodinated Contrast Media Hives, Rash and Other (See Comments)   Latex Rash   Other Hives and Rash    Bee Stings-swelling/hives/rash Wool (textile fiber)-Rash/itching   Tomato Rash and Other (See Comments)    Red tomatoes   Current Home Medications:   No current facility-administered medications for this encounter.    albuterol (VENTOLIN HFA) 108 (90 Base) MCG/ACT inhaler   baclofen (LIORESAL) 10 MG tablet   benzoyl peroxide-erythromycin (BENZAMYCIN) gel   calcium carbonate (OS-CAL) 600 MG TABS tablet   cholecalciferol (VITAMIN D) 1000 UNITS tablet   clopidogrel (PLAVIX) 75 MG tablet   EPINEPHrine 0.3 mg/0.3 mL IJ SOAJ injection   Multiple Vitamins-Minerals (HAIR SKIN AND NAILS FORMULA) TABS   NARCAN 4 MG/0.1ML LIQD nasal spray kit   OXYCODONE HCL PO   phytonadione (VITAMIN K) 2 MG/ML SOLN oral solution   pregabalin (LYRICA) 25 MG capsule   promethazine (PHENERGAN) 25 MG tablet   rizatriptan (MAXALT-MLT) 10 MG disintegrating tablet   rOPINIRole (REQUIP) 1 MG tablet   rosuvastatin (CRESTOR) 40 MG tablet   topiramate (TOPAMAX) 100 MG tablet   trazodone (DESYREL) 300 MG tablet   vitamin E 200 UNIT capsule   History:   Past Medical History:  Diagnosis Date   (HFpEF) heart failure with preserved ejection fraction (HCC)    a.) TTE 11/08/2017: EF 50%, no RWMAs, norm RVSF, RVSP 22.8, triv MR, mild  TR; b.) TEE 02/22/2021:; EF 60-65%, no LAA thrombus, ? PFO with (+) IAS within 3-6 cardiac cycles   Allergy    Anxiety    Aortic atherosclerosis (HCC)    Arthritis    Asthma    Bipolar disorder (HCC)    CAD (coronary artery disease) 12/21/2022   a.) LDCT 12/21/2022: age advanced  3 vessel CAD   Chronic pain syndrome    a.) on naloxone; has naloxone Rx available   CKD (chronic kidney disease), stage III (HCC)    COPD (chronic obstructive pulmonary disease) (HCC)    CVA (cerebral vascular accident) (HCC) 02/18/2021   a.) MRI brain 02/18/2021: tiny acute embolic infarct of the LEFT cerebellar hemisphere and LEFT cerebrum areas   CVA (cerebral vascular accident) (HCC) 02/20/2021   a.) MRI brain 02/20/2021: subcentimeter acute infarct RIGHT anterior cerebellar vermis; new since MRI 2 days prior (02/18/2021)   DDD (degenerative disc disease), cervical    DDD (degenerative disc disease), lumbar    Depression    Emphysema lung (HCC)    Fibromyalgia    Genetic testing 03/09/2017   Multi-Cancer panel (83 genes) @ [facility] - No pathogenic mutations detected   GERD (gastroesophageal reflux disease)    History of IBS    HLD (hyperlipidemia)    HTN (hypertension)    Hyperthyroidism    Insomnia    a.) taskes trazodone PRN   Invasive carcinoma of LEFT breast 2019  a.) clinical stage Ia (ER/PR +, HER2/neu over-expressing) --> Tx'd with surgical resection (lumpectomy) + systemic chemotherapy + XRT + truncated aromotase inhibitor therapy   Lacunar infarction (HCC) 01/29/2019   a.) noted on MRI brain 01/29/2019: 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 (new since 2014 imaging)   Long term current use of opiate analgesic    a.) has naloxone Rx available   Migraine    Nephrolithiasis    On chronic clopidogrel therapy    OSA (obstructive sleep apnea)    a.) unable to tolerate mask required for nocturnal PAP therapy   Patent foramen ovale with atrial septal aneurysm 02/22/2021    a.) TEE 02/22/2021: bubble study with (+) IAS observed within 3-6 cardiac cycles   Personal history of chemotherapy    Personal history of radiation therapy    PTSD (post-traumatic stress disorder)    Restless leg syndrome    a.) on ropinirole   Past Surgical History:  Procedure Laterality Date   ABDOMINAL HYSTERECTOMY  2008   BREAST BIOPSY Left 02/02/2017   [place] core positive   BREAST LUMPECTOMY Left 02/17/2017   BREAST LUMPECTOMY WITH NEEDLE LOCALIZATION Left 02/17/2017   Procedure: BREAST LUMPECTOMY WITH NEEDLE LOCALIZATION;  Surgeon: Ancil Linsey, MD;  Location: [facility];  Service: General;  Laterality: Left;   CARPAL TUNNEL RELEASE Bilateral    cryotherapy     DILATION AND CURETTAGE OF UTERUS     ENDOMETRIAL ABLATION     LAPAROSCOPIC OOPHERECTOMY Left    unsure which side but thinks its the left   LYSIS OF ADHESION Left 10/14/2015   Procedure: LYSIS OF ADHESION;  Surgeon: Juanell Fairly, MD;  Location: [facility];  Service: Orthopedics;  Laterality: Left;   NECK SURGERY     lower neck fusion rods and screws   OOPHORECTOMY     one ovary removed    PORTA CATH INSERTION N/A 03/08/2017   Procedure: PORTA CATH INSERTION;  Surgeon: Renford Dills, MD;  Location: [facility];  Service: Cardiovascular;  Laterality: N/A;   RESECTION DISTAL CLAVICAL Left 10/14/2015   Procedure: RESECTION DISTAL CLAVICAL;  Surgeon: Juanell Fairly, MD;  Location: [facility];  Service: Orthopedics;  Laterality: Left;   SENTINEL NODE BIOPSY Left 02/17/2017   Procedure: SENTINEL NODE BIOPSY;  Surgeon: Ancil Linsey, MD;  Location: [facility];  Service: General;  Laterality: Left;   SHOULDER ARTHROSCOPY WITH OPEN ROTATOR CUFF REPAIR Left 10/14/2015   Procedure: SHOULDER ARTHROSCOPY WITH OPEN ROTATOR CUFF REPAIR;  Surgeon: Juanell Fairly, MD;  Location: [facility];  Service: Orthopedics;  Laterality: Left;   SHOULDER ARTHROSCOPY WITH OPEN ROTATOR CUFF REPAIR AND DISTAL CLAVICLE ACROMINECTOMY Right  09/03/2014   Procedure: RIGHT SHOULDER ARTHROSCOPY WITH MINI OPEN ROTATOR CUFF TEAR;  Surgeon: Juanell Fairly, MD;  Location: [facility];  Service: Orthopedics;  Laterality: Right;  biceps tenodesis, arthroscopic subacromial decompression and distal clavicle incision   spinal injections     SUBACROMIAL DECOMPRESSION Left 10/14/2015   Procedure: SUBACROMIAL DECOMPRESSION;  Surgeon: Juanell Fairly, MD;  Location: [facility];  Service: Orthopedics;  Laterality: Left;   TEE WITHOUT CARDIOVERSION N/A 02/22/2021   Procedure: TRANSESOPHAGEAL ECHOCARDIOGRAM (TEE);  Surgeon: Lamar Blinks, MD;  Location: [facility];  Service: Cardiovascular;  Laterality: N/A;   Family History  Problem Relation Age of Onset   Breast cancer Mother 65       Glioblastoma also; deceased at 32   Diabetes Father    Lung cancer Father  mesothelioma; deceased 83s   Cancer Maternal Aunt        "bone ca"; unk. primary   Colon cancer Maternal Grandfather        dx in 67s; deceased 49   Colon cancer Maternal Aunt        dx in 27s; currently 18s   Lung cancer Maternal Grandmother        smoker; deceased 22   Lung cancer Paternal Grandmother        smoker; deceased 71s   Breast cancer Cousin        dx 67s; daughter of mat aunt with unk. primary cancer   Cancer Other        distant cousin; unknown primary   Colon cancer Other        dx 25s; currently 58; maternal half-sister   Bipolar disorder Sister    Schizophrenia Sister    Social History   Tobacco Use   Smoking status: Every Day    Current packs/day: 1.00    Average packs/day: 1 pack/day for 51.1 years (51.1 ttl pk-yrs)    Types: Cigarettes    Start date: 64   Smokeless tobacco: Never  Substance Use Topics   Alcohol use: No    Alcohol/week: 0.0 standard drinks of alcohol   Pertinent Clinical Results:  LABS:  Lab Results  Component Value Date   WBC 9.5 02/22/2023   HGB 13.8 02/22/2023   HCT 42.1 02/22/2023   MCV 98.4 02/22/2023   PLT 232  02/22/2023   Lab Results  Component Value Date   NA 138 02/20/2023   K 4.6 02/20/2023   CO2 21 02/20/2023   GLUCOSE 88 02/20/2023   BUN 17 02/20/2023   CREATININE 1.24 (H) 02/20/2023   CALCIUM 9.5 02/20/2023   EGFR 50 (L) 02/20/2023   GFRNONAA >60 02/20/2021    ECG: Date: 02/22/2023 Time ECG obtained: 1144 AM Rate: 95 bpm Rhythm: normal sinus Axis (leads I and aVF): normal Intervals: PR 132 ms. QRS 74 ms. QTc 422 ms. ST segment and T wave changes: No evidence of acute T wave abnormalities or significant ST segment elevation or depression.  Comparison: Similar to previous tracing obtained on 02/20/2021   IMAGING / PROCEDURES: ABDOMEN 1 VIEW (KUB) performed on 02/08/2023 A 11 mm calcification overlying the left renal shadow-finding may represent nephrolithiasis. Nonobstructive bowel gas pattern.   CT CHEST LUNG CA SCREEN LOW DOSE W/O CM performed on 12/21/2022 Lung-RADS 1, negative. Continue annual screening with low-dose chest CT without contrast in 12 months. Age advanced three-vessel coronary artery calcification. Left renal stone. Aortic atherosclerosis  Emphysema  ECHO TEE performed on 02/22/2021 Left ventricular ejection fraction, by estimation, is 60 to 65%. The left ventricle has normal function.  Right ventricular systolic function is normal. The right ventricular size is normal.  No left atrial/left atrial appendage thrombus was detected.  The mitral valve is normal in structure. Trivial mitral valve regurgitation.  The aortic valve is normal in structure. Aortic valve regurgitation is trivial.  Cannot exclude a small PFO. Agitated saline contrast bubble study was positive with shunting observed within 3-6 cardiac cycles suggestive of interatrial shunt.   MR BRAIN AND MRA NECK WO CONTRAST performed on 02/20/2021 Subcentimeter acute infarct right anterior cerebellar vermis is new since the MRI 2 days ago. No change in multiple small embolic infarcts in the left  cerebral hemisphere and left cerebellum. Chronic infarct left anterior basal ganglia unchanged Negative MRA neck without contrast.  Impression and Plan:  Manyah Rappaport Roundtree has been referred for pre-anesthesia review and clearance prior to her undergoing the planned anesthetic and procedural courses. Available labs, pertinent testing, and imaging results were personally reviewed by me in preparation for upcoming operative/procedural course. [facility] medical record has been updated following extensive record review and patient interview with PAT staff.   This patient has been appropriately cleared by cardiology with an overall LOW risk of experiencing significant perioperative cardiovascular complications. Based on clinical review performed today (02/22/23), barring any significant acute changes in the patient's overall condition, it is anticipated that she will be able to proceed with the planned surgical intervention. Any acute changes in clinical condition may necessitate her procedure being postponed and/or cancelled. Patient will meet with anesthesia team (MD and/or CRNA) on the day of her procedure for preoperative evaluation/assessment. Questions regarding anesthetic course will be fielded at that time.   Pre-surgical instructions were reviewed with the patient during his PAT appointment, and questions were fielded to satisfaction by PAT clinical staff. She has been instructed on which medications that she will need to hold prior to surgery, as well as the ones that have been deemed safe/appropriate to take on the day of his procedure. As part of the general education provided by PAT, patient made aware both verbally and in writing, that she would need to abstain from the use of any illegal substances during his perioperative course. She was advised that failure to follow the provided instructions could necessitate case cancellation or result in serious perioperative complications up to and including  death. Patient encouraged to contact PAT and/or her surgeon's office to discuss any questions or concerns that may arise prior to surgery; verbalized understanding.   Quentin Mulling, MSN, APRN, FNP-C, CEN [facility]  Perioperative Services Nurse Practitioner Phone: [phone] Fax: [phone] 02/22/23 5:12 PM  NOTE: This note has been prepared using Dragon dictation software. Despite my best ability to proofread, there is always the potential that unintentional transcriptional errors may still occur from this process.

## 2023-02-23 ENCOUNTER — Ambulatory Visit: Payer: 59

## 2023-02-23 ENCOUNTER — Encounter
Admission: RE | Disposition: A | Discharge status: Home / Self Care | Payer: Self-pay | Source: Home / Self Care | Attending: Urology

## 2023-02-23 ENCOUNTER — Ambulatory Visit: Payer: 59 | Admitting: Urgent Care

## 2023-02-23 ENCOUNTER — Encounter: Payer: Self-pay | Admitting: Urology

## 2023-02-23 ENCOUNTER — Ambulatory Visit
Admission: RE | Admit: 2023-02-23 | Discharge: 2023-02-23 | Disposition: A | Discharge status: Home / Self Care | Payer: 59 | Attending: Urology | Admitting: Urology

## 2023-02-23 ENCOUNTER — Other Ambulatory Visit: Payer: Self-pay

## 2023-02-23 DIAGNOSIS — F17210 Nicotine dependence, cigarettes, uncomplicated: Secondary | ICD-10-CM | POA: Insufficient documentation

## 2023-02-23 DIAGNOSIS — I130 Hypertensive heart and chronic kidney disease with heart failure and stage 1 through stage 4 chronic kidney disease, or unspecified chronic kidney disease: Secondary | ICD-10-CM | POA: Insufficient documentation

## 2023-02-23 DIAGNOSIS — I700 Atherosclerosis of aorta: Secondary | ICD-10-CM | POA: Insufficient documentation

## 2023-02-23 DIAGNOSIS — F4310 Post-traumatic stress disorder, unspecified: Secondary | ICD-10-CM | POA: Insufficient documentation

## 2023-02-23 DIAGNOSIS — G2581 Restless legs syndrome: Secondary | ICD-10-CM | POA: Insufficient documentation

## 2023-02-23 DIAGNOSIS — I10 Essential (primary) hypertension: Secondary | ICD-10-CM | POA: Diagnosis not present

## 2023-02-23 DIAGNOSIS — Z9221 Personal history of antineoplastic chemotherapy: Secondary | ICD-10-CM | POA: Diagnosis not present

## 2023-02-23 DIAGNOSIS — N183 Chronic kidney disease, stage 3 unspecified: Secondary | ICD-10-CM | POA: Insufficient documentation

## 2023-02-23 DIAGNOSIS — Z853 Personal history of malignant neoplasm of breast: Secondary | ICD-10-CM | POA: Insufficient documentation

## 2023-02-23 DIAGNOSIS — I2510 Atherosclerotic heart disease of native coronary artery without angina pectoris: Secondary | ICD-10-CM | POA: Insufficient documentation

## 2023-02-23 DIAGNOSIS — F319 Bipolar disorder, unspecified: Secondary | ICD-10-CM | POA: Diagnosis not present

## 2023-02-23 DIAGNOSIS — G4700 Insomnia, unspecified: Secondary | ICD-10-CM | POA: Insufficient documentation

## 2023-02-23 DIAGNOSIS — E039 Hypothyroidism, unspecified: Secondary | ICD-10-CM | POA: Insufficient documentation

## 2023-02-23 DIAGNOSIS — F419 Anxiety disorder, unspecified: Secondary | ICD-10-CM | POA: Diagnosis not present

## 2023-02-23 DIAGNOSIS — M797 Fibromyalgia: Secondary | ICD-10-CM | POA: Diagnosis not present

## 2023-02-23 DIAGNOSIS — G4733 Obstructive sleep apnea (adult) (pediatric): Secondary | ICD-10-CM | POA: Diagnosis not present

## 2023-02-23 DIAGNOSIS — Z7902 Long term (current) use of antithrombotics/antiplatelets: Secondary | ICD-10-CM | POA: Diagnosis not present

## 2023-02-23 DIAGNOSIS — E785 Hyperlipidemia, unspecified: Secondary | ICD-10-CM | POA: Diagnosis not present

## 2023-02-23 DIAGNOSIS — M1990 Unspecified osteoarthritis, unspecified site: Secondary | ICD-10-CM | POA: Insufficient documentation

## 2023-02-23 DIAGNOSIS — Z8673 Personal history of transient ischemic attack (TIA), and cerebral infarction without residual deficits: Secondary | ICD-10-CM | POA: Insufficient documentation

## 2023-02-23 DIAGNOSIS — J4489 Other specified chronic obstructive pulmonary disease: Secondary | ICD-10-CM | POA: Diagnosis not present

## 2023-02-23 DIAGNOSIS — G894 Chronic pain syndrome: Secondary | ICD-10-CM | POA: Diagnosis not present

## 2023-02-23 DIAGNOSIS — K219 Gastro-esophageal reflux disease without esophagitis: Secondary | ICD-10-CM | POA: Insufficient documentation

## 2023-02-23 DIAGNOSIS — I5030 Unspecified diastolic (congestive) heart failure: Secondary | ICD-10-CM

## 2023-02-23 DIAGNOSIS — N200 Calculus of kidney: Secondary | ICD-10-CM | POA: Diagnosis not present

## 2023-02-23 HISTORY — DX: Post-traumatic stress disorder, unspecified: F43.10

## 2023-02-23 HISTORY — DX: Long term (current) use of anticoagulants: Z79.01

## 2023-02-23 HISTORY — DX: Other cervical disc degeneration, unspecified cervical region: M50.30

## 2023-02-23 HISTORY — DX: Long term (current) use of opiate analgesic: Z79.891

## 2023-02-23 HISTORY — DX: Obstructive sleep apnea (adult) (pediatric): G47.33

## 2023-02-23 HISTORY — DX: Essential (primary) hypertension: I10

## 2023-02-23 HISTORY — DX: Hyperlipidemia, unspecified: E78.5

## 2023-02-23 HISTORY — DX: Calculus of kidney: N20.0

## 2023-02-23 HISTORY — PX: EXTRACORPOREAL SHOCK WAVE LITHOTRIPSY: SHX1557

## 2023-02-23 HISTORY — DX: Bipolar disorder, unspecified: F31.9

## 2023-02-23 HISTORY — DX: Chronic kidney disease, stage 3 unspecified: N18.30

## 2023-02-23 HISTORY — DX: Unspecified diastolic (congestive) heart failure: I50.30

## 2023-02-23 HISTORY — DX: Chronic pain syndrome: G89.4

## 2023-02-23 HISTORY — DX: Insomnia, unspecified: G47.00

## 2023-02-23 SURGERY — EXTRACORPOREAL SHOCK WAVE LITHOTRIPSY (ESWL)
Anesthesia: General | Laterality: Left

## 2023-02-23 MED ORDER — DEXMEDETOMIDINE HCL IN NACL 80 MCG/20ML IV SOLN
INTRAVENOUS | Status: DC | PRN
  Administered 2023-02-23: 12 ug via INTRAVENOUS

## 2023-02-23 MED ORDER — TAMSULOSIN HCL 0.4 MG PO CAPS: 0.4000 mg | ORAL_CAPSULE | Freq: Every day | ORAL | 0 refills | Status: DC

## 2023-02-23 MED ORDER — PROPOFOL 10 MG/ML IV BOLUS
INTRAVENOUS | Status: AC
  Filled 2023-02-23: qty 20

## 2023-02-23 MED ORDER — DIAZEPAM 5 MG PO TABS
ORAL_TABLET | ORAL | Status: AC
  Filled 2023-02-23: qty 2

## 2023-02-23 MED ORDER — CEPHALEXIN 500 MG PO CAPS: 500.0000 mg | ORAL_CAPSULE | Freq: Once | ORAL | Status: DC

## 2023-02-23 MED ORDER — MIDAZOLAM HCL 2 MG/2ML IJ SOLN
INTRAMUSCULAR | Status: AC
  Filled 2023-02-23: qty 2

## 2023-02-23 MED ORDER — FENTANYL CITRATE (PF) 100 MCG/2ML IJ SOLN
INTRAMUSCULAR | Status: AC
  Filled 2023-02-23: qty 2

## 2023-02-23 MED ORDER — DIPHENHYDRAMINE HCL 25 MG PO CAPS
ORAL_CAPSULE | ORAL | Status: AC
  Filled 2023-02-23: qty 1

## 2023-02-23 MED ORDER — ONDANSETRON HCL 4 MG/2ML IJ SOLN
INTRAMUSCULAR | Status: AC
  Filled 2023-02-23: qty 2

## 2023-02-23 MED ORDER — ORAL CARE MOUTH RINSE
15.0000 mL | Freq: Once | OROMUCOSAL | Status: AC
Start: 2023-02-23 — End: 2023-02-23
  Administered 2023-02-23: 15 mL via OROMUCOSAL

## 2023-02-23 MED ORDER — PROPOFOL 1000 MG/100ML IV EMUL
INTRAVENOUS | Status: AC
  Filled 2023-02-23: qty 100

## 2023-02-23 MED ORDER — DIAZEPAM 5 MG PO TABS
10.0000 mg | ORAL_TABLET | ORAL | Status: AC
  Administered 2023-02-23: 10 mg via ORAL

## 2023-02-23 MED ORDER — CEPHALEXIN 500 MG PO CAPS
ORAL_CAPSULE | ORAL | Status: AC
  Filled 2023-02-23: qty 1

## 2023-02-23 MED ORDER — SODIUM CHLORIDE 0.9 % IV SOLN: INTRAVENOUS | Status: DC

## 2023-02-23 MED ORDER — ONDANSETRON HCL 4 MG/2ML IJ SOLN
4.0000 mg | Freq: Once | INTRAMUSCULAR | Status: AC
  Administered 2023-02-23: 4 mg via INTRAVENOUS

## 2023-02-23 MED ORDER — FENTANYL CITRATE (PF) 100 MCG/2ML IJ SOLN
INTRAMUSCULAR | Status: DC | PRN
  Administered 2023-02-23: 50 ug via INTRAVENOUS

## 2023-02-23 MED ORDER — PROPOFOL 500 MG/50ML IV EMUL
INTRAVENOUS | Status: DC | PRN
  Administered 2023-02-23: 25 ug/kg/min via INTRAVENOUS

## 2023-02-23 MED ORDER — GLYCOPYRROLATE 0.2 MG/ML IJ SOLN
INTRAMUSCULAR | Status: DC | PRN
  Administered 2023-02-23: .2 mg via INTRAVENOUS

## 2023-02-23 MED ORDER — MIDAZOLAM HCL 5 MG/5ML IJ SOLN
INTRAMUSCULAR | Status: DC | PRN
  Administered 2023-02-23: 2 mg via INTRAVENOUS
  Administered 2023-02-23: 1 mg via INTRAVENOUS

## 2023-02-23 MED ORDER — CHLORHEXIDINE GLUCONATE 0.12 % MT SOLN: 15.0000 mL | Freq: Once | OROMUCOSAL | Status: AC

## 2023-02-23 MED ORDER — LACTATED RINGERS IV SOLN: INTRAVENOUS | Status: DC

## 2023-02-23 MED ORDER — ONDANSETRON HCL 4 MG/2ML IJ SOLN
INTRAMUSCULAR | Status: DC | PRN
  Administered 2023-02-23: 4 mg via INTRAVENOUS

## 2023-02-23 MED ORDER — GLYCOPYRROLATE 0.2 MG/ML IJ SOLN
INTRAMUSCULAR | Status: AC
  Filled 2023-02-23: qty 1

## 2023-02-23 MED ORDER — DIPHENHYDRAMINE HCL 25 MG PO CAPS
25.0000 mg | ORAL_CAPSULE | ORAL | Status: AC
  Administered 2023-02-23: 25 mg via ORAL

## 2023-02-23 MED ORDER — LIDOCAINE HCL (PF) 2 % IJ SOLN
INTRAMUSCULAR | Status: AC
  Filled 2023-02-23: qty 5

## 2023-02-23 NOTE — Interval H&P Note (Signed)
History and Physical Interval Note:  02/23/2023 9:05 AM  Briana [name]  has presented today for surgery, with the diagnosis of Left Nephrolithiasis.  The various methods of treatment have been discussed with the patient and family. After consideration of risks, benefits and other options for treatment, the patient has consented to  Procedure(s): EXTRACORPOREAL SHOCK WAVE LITHOTRIPSY (ESWL) (Left) as a surgical intervention.  The patient's history has been reviewed, patient examined, no change in status, stable for surgery.  I have reviewed the patient's chart and labs.  Questions were answered to the patient's satisfaction.    RRR CTAB  [name]

## 2023-02-23 NOTE — Anesthesia Postprocedure Evaluation (Signed)
Anesthesia Post Note  Patient: Briana [name]  Procedure(s) Performed: EXTRACORPOREAL SHOCK WAVE LITHOTRIPSY (ESWL) (Left)  Patient location during evaluation: PACU Anesthesia Type: General Level of consciousness: awake and alert Pain management: pain level controlled Vital Signs Assessment: post-procedure vital signs reviewed and stable Respiratory status: spontaneous breathing, nonlabored ventilation, respiratory function stable and patient connected to nasal cannula oxygen Cardiovascular status: blood pressure returned to baseline and stable Postop Assessment: no apparent nausea or vomiting Anesthetic complications: no   No notable events documented.   Last Vitals:  Vitals:   02/23/23 0701 02/23/23 0905  BP: (!) 106/56 106/78  Pulse: 86 81  Resp: 16 15  Temp: 36.7 C (!) 36.1 C  SpO2: 100% 98%    Last Pain:  Vitals:   02/23/23 0905  TempSrc: Temporal  PainSc: 0-No pain                 Cleda Mccreedy Harleyquinn Gasser

## 2023-02-23 NOTE — Transfer of Care (Signed)
Immediate Anesthesia Transfer of Care Note  Patient: Briana [name]  Procedure(s) Performed: EXTRACORPOREAL SHOCK WAVE LITHOTRIPSY (ESWL) (Left)  Patient Location: Short Stay  Anesthesia Type:General  Level of Consciousness: awake, alert , and oriented  Airway & Oxygen Therapy: Patient Spontanous Breathing  Post-op Assessment: Report given to RN and Post -op Vital signs reviewed and stable  Post vital signs: Reviewed  Last Vitals:  Vitals Value Taken Time  BP 106/78 02/23/23 0905  Temp 36.1 C 02/23/23 0905  Pulse 81 02/23/23 0905  Resp 15 02/23/23 0905  SpO2 98 % 02/23/23 0905    Last Pain:  Vitals:   02/23/23 0905  TempSrc: Temporal  PainSc: 0-No pain      Patients Stated Pain Goal: 0 (02/23/23 0701)  Complications: No notable events documented.

## 2023-02-23 NOTE — Anesthesia Preprocedure Evaluation (Signed)
Anesthesia Evaluation  Patient identified by MRN, date of birth, ID band Patient awake    Reviewed: Allergy & Precautions, NPO status , Patient's Chart, lab work & pertinent test results  History of Anesthesia Complications Negative for: history of anesthetic complications  Airway Mallampati: III  TM Distance: <3 FB Neck ROM: full    Dental  (+) Chipped   Pulmonary asthma , sleep apnea , COPD,  oxygen dependent, Current Smoker   Pulmonary exam normal        Cardiovascular hypertension, + CAD  Normal cardiovascular exam     Neuro/Psych  Headaches PSYCHIATRIC DISORDERS       Neuromuscular disease CVA    GI/Hepatic Neg liver ROS,GERD  ,,  Endo/Other   Hyperthyroidism   Renal/GU Renal disease  negative genitourinary   Musculoskeletal   Abdominal   Peds  Hematology negative hematology ROS (+)   Anesthesia Other Findings Past Medical History: No date: (HFpEF) heart failure with preserved ejection fraction (HCC)     Comment:  a.) TTE 11/08/2017: EF 50%, no RWMAs, norm RVSF, RVSP               22.8, triv MR, mild TR; b.) TEE 02/22/2021:; EF 60-65%,               no LAA thrombus, ? PFO with (+) IAS within 3-6 cardiac               cycles No date: Allergy No date: Anxiety No date: Aortic atherosclerosis (HCC) No date: Arthritis No date: Asthma No date: Bipolar disorder (HCC) 12/21/2022: CAD (coronary artery disease)     Comment:  a.) LDCT 12/21/2022: age advanced  3 vessel CAD No date: Chronic pain syndrome     Comment:  a.) on naloxone; has naloxone Rx available No date: CKD (chronic kidney disease), stage III (HCC) No date: COPD (chronic obstructive pulmonary disease) (HCC) 02/18/2021: CVA (cerebral vascular accident) (HCC)     Comment:  a.) MRI brain 02/18/2021: tiny acute embolic infarct of               the LEFT cerebellar hemisphere and LEFT cerebrum areas 02/20/2021: CVA (cerebral vascular accident) [facility])      Comment:  a.) MRI brain 02/20/2021: subcentimeter acute infarct               RIGHT anterior cerebellar vermis; new since MRI 2 days               prior (02/18/2021) No date: DDD (degenerative disc disease), cervical No date: DDD (degenerative disc disease), lumbar No date: Depression No date: Emphysema lung (HCC) No date: Fibromyalgia 03/09/2017: Genetic testing     Comment:  Multi-Cancer panel (83 genes) @ [facility] - No pathogenic               mutations detected No date: GERD (gastroesophageal reflux disease) No date: History of IBS No date: HLD (hyperlipidemia) No date: HTN (hypertension) No date: Hyperthyroidism 01/2021: Implantable loop recorder present No date: Insomnia     Comment:  a.) taskes trazodone PRN 2019: Invasive carcinoma of LEFT breast     Comment:  a.) clinical stage Ia (ER/PR +, HER2/neu               over-expressing) --> Tx'd with surgical resection               (lumpectomy) + systemic chemotherapy + XRT + truncated  aromotase inhibitor therapy 01/29/2019: Lacunar infarction [facility])     Comment:  a.) noted on MRI brain 01/29/2019: 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 (new since 2014              imaging) No date: Long term current use of opiate analgesic     Comment:  a.) has naloxone Rx available No date: Migraine No date: Nephrolithiasis No date: On chronic clopidogrel therapy No date: OSA (obstructive sleep apnea)     Comment:  a.) unable to tolerate mask required for nocturnal PAP               therapy 02/22/2021: Patent foramen ovale with atrial septal aneurysm     Comment:  a.) TEE 02/22/2021: bubble study with (+) IAS observed               within 3-6 cardiac cycles No date: Personal history of chemotherapy No date: Personal history of radiation therapy No date: PTSD (post-traumatic stress disorder) No date: Restless leg syndrome     Comment:  a.) on ropinirole  Past Surgical History: 2008:  ABDOMINAL HYSTERECTOMY 02/02/2017: BREAST BIOPSY; Left     Comment:  [place] core positive 02/17/2017: BREAST LUMPECTOMY; Left 02/17/2017: BREAST LUMPECTOMY WITH NEEDLE LOCALIZATION; Left     Comment:  Procedure: BREAST LUMPECTOMY WITH NEEDLE LOCALIZATION;                Surgeon: Ancil Linsey, MD;  Location: [facility] ORS;                Service: General;  Laterality: Left; No date: CARPAL TUNNEL RELEASE; Bilateral No date: cryotherapy No date: DILATION AND CURETTAGE OF UTERUS No date: ENDOMETRIAL ABLATION No date: LAPAROSCOPIC OOPHERECTOMY; Left     Comment:  unsure which side but thinks its the left 10/14/2015: LYSIS OF ADHESION; Left     Comment:  Procedure: LYSIS OF ADHESION;  Surgeon: Juanell Fairly,              MD;  Location: [facility] ORS;  Service: Orthopedics;                Laterality: Left; No date: NECK SURGERY     Comment:  lower neck fusion rods and screws No date: OOPHORECTOMY     Comment:  one ovary removed  03/08/2017: PORTA CATH INSERTION; N/A     Comment:  Procedure: PORTA CATH INSERTION;  Surgeon: Renford Dills, MD;  Location: [facility] INV[facility];  Service:              Cardiovascular;  Laterality: N/A; 10/14/2015: RESECTION DISTAL CLAVICAL; Left     Comment:  Procedure: RESECTION DISTAL CLAVICAL;  Surgeon: Juanell Fairly, MD;  Location: [facility] ORS;  Service:               Orthopedics;  Laterality: Left; 02/17/2017: SENTINEL NODE BIOPSY; Left     Comment:  Procedure: SENTINEL NODE BIOPSY;  Surgeon: Ancil Linsey, MD;  Location: [facility] ORS;  Service: General;                Laterality: Left; 10/14/2015: SHOULDER ARTHROSCOPY WITH OPEN ROTATOR CUFF REPAIR; Left  Comment:  Procedure: SHOULDER ARTHROSCOPY WITH OPEN ROTATOR CUFF               REPAIR;  Surgeon: Juanell Fairly, MD;  Location: [facility]               ORS;  Service: Orthopedics;  Laterality: Left; 09/03/2014: SHOULDER ARTHROSCOPY WITH OPEN ROTATOR CUFF REPAIR AND  DISTAL  CLAVICLE ACROMINECTOMY; Right     Comment:  Procedure: RIGHT SHOULDER ARTHROSCOPY WITH MINI OPEN               ROTATOR CUFF TEAR;  Surgeon: Juanell Fairly, MD;                Location: [facility] ORS;  Service: Orthopedics;  Laterality:               Right;  biceps tenodesis, arthroscopic subacromial               decompression and distal clavicle incision No date: spinal injections 10/14/2015: SUBACROMIAL DECOMPRESSION; Left     Comment:  Procedure: SUBACROMIAL DECOMPRESSION;  Surgeon: Juanell Fairly, MD;  Location: [facility] ORS;  Service:               Orthopedics;  Laterality: Left; 02/22/2021: TEE WITHOUT CARDIOVERSION; N/A     Comment:  Procedure: TRANSESOPHAGEAL ECHOCARDIOGRAM (TEE);                Surgeon: Lamar Blinks, MD;  Location: [facility] ORS;                Service: Cardiovascular;  Laterality: N/A;  BMI    Body Mass Index: 26.73 kg/m      Reproductive/Obstetrics negative OB ROS                             Anesthesia Physical Anesthesia Plan  ASA: 3  Anesthesia Plan: General   Post-op Pain Management:    Induction: Intravenous  PONV Risk Score and Plan: Propofol infusion and TIVA  Airway Management Planned: Natural Airway and Nasal Cannula  Additional Equipment:   Intra-op Plan:   Post-operative Plan:   Informed Consent: I have reviewed the patients History and Physical, chart, labs and discussed the procedure including the risks, benefits and alternatives for the proposed anesthesia with the patient or authorized representative who has indicated his/her understanding and acceptance.     Dental Advisory Given  Plan Discussed with: Anesthesiologist, CRNA and Surgeon  Anesthesia Plan Comments: (Patient consented for risks of anesthesia including but not limited to:  - adverse reactions to medications - risk of airway placement if required - damage to eyes, teeth, lips or other oral mucosa - nerve damage due to  positioning  - sore throat or hoarseness - Damage to heart, brain, nerves, lungs, other parts of body or loss of life  Patient voiced understanding and assent.)       Anesthesia Quick Evaluation

## 2023-02-24 ENCOUNTER — Encounter: Payer: Self-pay | Admitting: Urology

## 2023-03-05 DIAGNOSIS — J449 Chronic obstructive pulmonary disease, unspecified: Secondary | ICD-10-CM | POA: Diagnosis not present

## 2023-03-06 DIAGNOSIS — G894 Chronic pain syndrome: Secondary | ICD-10-CM | POA: Diagnosis not present

## 2023-03-06 DIAGNOSIS — Z79891 Long term (current) use of opiate analgesic: Secondary | ICD-10-CM | POA: Diagnosis not present

## 2023-03-06 DIAGNOSIS — M5134 Other intervertebral disc degeneration, thoracic region: Secondary | ICD-10-CM | POA: Diagnosis not present

## 2023-03-06 DIAGNOSIS — M19019 Primary osteoarthritis, unspecified shoulder: Secondary | ICD-10-CM | POA: Diagnosis not present

## 2023-03-08 ENCOUNTER — Ambulatory Visit: Payer: Medicare Other | Admitting: Urology

## 2023-03-14 NOTE — Progress Notes (Unsigned)
03/16/2023 5:08 PM   Anderson Malta Apr 11, 1965 [id number]  Referring provider: Marisue Ivan, NP [address] [place],  [place] [address]  Urological history  1. Nephrolithiasis -Remote history of nephrolithiasis with spontaneous passage is in her late 71s  No chief complaint on file.  HPI: Briana [name] is a 58 y.o. who is status post ESWL who presents today for follow up.  Underwent ESWL on 02/23/2023 for a  8 mm left renal stone with Dr. Apolinar Junes.  Their postprocedural course was as expected and uneventful.   They have/have not passed fragments. ***   They bring in fragments for analysis. ***  KUB ***  UA ***  PMH: Past Medical History:  Diagnosis Date   (HFpEF) heart failure with preserved ejection fraction (HCC)    a.) TTE 11/08/2017: EF 50%, no RWMAs, norm RVSF, RVSP 22.8, triv MR, mild TR; b.) TEE 02/22/2021:; EF 60-65%, no LAA thrombus, ? PFO with (+) IAS within 3-6 cardiac cycles   Allergy    Anxiety    Aortic atherosclerosis (HCC)    Arthritis    Asthma    Bipolar disorder (HCC)    CAD (coronary artery disease) 12/21/2022   a.) LDCT 12/21/2022: age advanced  3 vessel CAD   Chronic pain syndrome    a.) on naloxone; has naloxone Rx available   CKD (chronic kidney disease), stage III (HCC)    COPD (chronic obstructive pulmonary disease) (HCC)    CVA (cerebral vascular accident) (HCC) 02/18/2021   a.) MRI brain 02/18/2021: tiny acute embolic infarct of the LEFT cerebellar hemisphere and LEFT cerebrum areas   CVA (cerebral vascular accident) (HCC) 02/20/2021   a.) MRI brain 02/20/2021: subcentimeter acute infarct RIGHT anterior cerebellar vermis; new since MRI 2 days prior (02/18/2021)   DDD (degenerative disc disease), cervical    DDD (degenerative disc disease), lumbar    Depression    Emphysema lung (HCC)    Fibromyalgia    Genetic testing 03/09/2017   Multi-Cancer panel (83 genes) @ [facility] - No pathogenic mutations detected   GERD (gastroesophageal reflux  disease)    History of IBS    HLD (hyperlipidemia)    HTN (hypertension)    Hyperthyroidism    Implantable loop recorder present 01/2021   Insomnia    a.) taskes trazodone PRN   Invasive carcinoma of LEFT breast 2019   a.) clinical stage Ia (ER/PR +, HER2/neu over-expressing) --> Tx'd with surgical resection (lumpectomy) + systemic chemotherapy + XRT + truncated aromotase inhibitor therapy   Lacunar infarction (HCC) 01/29/2019   a.) noted on MRI brain 01/29/2019: 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 (new since 2014 imaging)   Long term current use of opiate analgesic    a.) has naloxone Rx available   Migraine    Nephrolithiasis    On chronic clopidogrel therapy    OSA (obstructive sleep apnea)    a.) unable to tolerate mask required for nocturnal PAP therapy   Patent foramen ovale with atrial septal aneurysm 02/22/2021   a.) TEE 02/22/2021: bubble study with (+) IAS observed within 3-6 cardiac cycles   Personal history of chemotherapy    Personal history of radiation therapy    PTSD (post-traumatic stress disorder)    Restless leg syndrome    a.) on ropinirole    Surgical History: Past Surgical History:  Procedure Laterality Date   ABDOMINAL HYSTERECTOMY  2008   BREAST BIOPSY Left 02/02/2017   [place] core positive   BREAST LUMPECTOMY  Left 02/17/2017   BREAST LUMPECTOMY WITH NEEDLE LOCALIZATION Left 02/17/2017   Procedure: BREAST LUMPECTOMY WITH NEEDLE LOCALIZATION;  Surgeon: Ancil Linsey, MD;  Location: [facility];  Service: General;  Laterality: Left;   CARPAL TUNNEL RELEASE Bilateral    cryotherapy     DILATION AND CURETTAGE OF UTERUS     ENDOMETRIAL ABLATION     EXTRACORPOREAL SHOCK WAVE LITHOTRIPSY Left 02/23/2023   Procedure: EXTRACORPOREAL SHOCK WAVE LITHOTRIPSY (ESWL);  Surgeon: [name], MD;  Location: [facility];  Service: Urology;  Laterality: Left;   LAPAROSCOPIC OOPHERECTOMY Left    unsure which side but thinks its the left   LYSIS  OF ADHESION Left 10/14/2015   Procedure: LYSIS OF ADHESION;  Surgeon: Juanell Fairly, MD;  Location: [facility];  Service: Orthopedics;  Laterality: Left;   NECK SURGERY     lower neck fusion rods and screws   OOPHORECTOMY     one ovary removed    PORTA CATH INSERTION N/A 03/08/2017   Procedure: PORTA CATH INSERTION;  Surgeon: Renford Dills, MD;  Location: [facility];  Service: Cardiovascular;  Laterality: N/A;   RESECTION DISTAL CLAVICAL Left 10/14/2015   Procedure: RESECTION DISTAL CLAVICAL;  Surgeon: Juanell Fairly, MD;  Location: [facility];  Service: Orthopedics;  Laterality: Left;   SENTINEL NODE BIOPSY Left 02/17/2017   Procedure: SENTINEL NODE BIOPSY;  Surgeon: Ancil Linsey, MD;  Location: [facility];  Service: General;  Laterality: Left;   SHOULDER ARTHROSCOPY WITH OPEN ROTATOR CUFF REPAIR Left 10/14/2015   Procedure: SHOULDER ARTHROSCOPY WITH OPEN ROTATOR CUFF REPAIR;  Surgeon: Juanell Fairly, MD;  Location: [facility];  Service: Orthopedics;  Laterality: Left;   SHOULDER ARTHROSCOPY WITH OPEN ROTATOR CUFF REPAIR AND DISTAL CLAVICLE ACROMINECTOMY Right 09/03/2014   Procedure: RIGHT SHOULDER ARTHROSCOPY WITH MINI OPEN ROTATOR CUFF TEAR;  Surgeon: Juanell Fairly, MD;  Location: [facility];  Service: Orthopedics;  Laterality: Right;  biceps tenodesis, arthroscopic subacromial decompression and distal clavicle incision   spinal injections     SUBACROMIAL DECOMPRESSION Left 10/14/2015   Procedure: SUBACROMIAL DECOMPRESSION;  Surgeon: Juanell Fairly, MD;  Location: [facility];  Service: Orthopedics;  Laterality: Left;   TEE WITHOUT CARDIOVERSION N/A 02/22/2021   Procedure: TRANSESOPHAGEAL ECHOCARDIOGRAM (TEE);  Surgeon: Lamar Blinks, MD;  Location: [facility];  Service: Cardiovascular;  Laterality: N/A;    Home Medications:  Current Outpatient Medications on File Prior to Visit  Medication Sig Dispense Refill   albuterol (VENTOLIN HFA) 108 (90 Base) MCG/ACT inhaler Inhale 1-2  puffs into the lungs every 6 (six) hours as needed for wheezing or shortness of breath. 8 g 3   baclofen (LIORESAL) 10 MG tablet Take 10 mg by mouth 3 (three) times daily as needed for muscle spasms (typically 2-3 times daily).      benzoyl peroxide-erythromycin (BENZAMYCIN) gel Apply to affected area 2 times daily 47 g 0   calcium carbonate (OS-CAL) 600 MG TABS tablet Take by mouth.     cholecalciferol (VITAMIN D) 1000 UNITS tablet Take 1,000 Units by mouth 2 (two) times daily.      clopidogrel (PLAVIX) 75 MG tablet Take 1 tablet (75 mg total) by mouth daily. 90 tablet 0   EPINEPHrine 0.3 mg/0.3 mL IJ SOAJ injection Inject 0.3 mg into the muscle as needed for anaphylaxis. 2 each 5   Multiple Vitamins-Minerals (HAIR SKIN AND NAILS FORMULA) TABS Take 2 tablets by mouth daily.     NARCAN 4 MG/0.1ML LIQD nasal spray kit  OXYCODONE HCL PO Take 15 mg by mouth as needed. Take 4-6 times a day as needed.     phytonadione (VITAMIN K) 2 MG/ML SOLN oral solution Take 1 mg by mouth once.     pregabalin (LYRICA) 25 MG capsule Take 1 capsule by mouth every other day.     promethazine (PHENERGAN) 25 MG tablet Take 1 tablet (25 mg total) by mouth every 6 (six) hours as needed for nausea or vomiting. 30 tablet 0   rizatriptan (MAXALT-MLT) 10 MG disintegrating tablet Take 10 mg by mouth as needed for migraine. May repeat in 2 hours if needed     rOPINIRole (REQUIP) 1 MG tablet Take 1 tablet (1 mg total) by mouth at bedtime. 90 tablet 0   rosuvastatin (CRESTOR) 40 MG tablet Take 1 tablet (40 mg total) by mouth at bedtime. 90 tablet 1   tamsulosin (FLOMAX) 0.4 MG CAPS capsule Take 1 capsule (0.4 mg total) by mouth daily. 30 capsule 0   topiramate (TOPAMAX) 100 MG tablet Take 200 mg by mouth daily.      trazodone (DESYREL) 300 MG tablet Take 1 tablet (300 mg total) by mouth at bedtime. 90 tablet 1   vitamin E 200 UNIT capsule Take 200 Units by mouth daily.     No current facility-administered medications on file  prior to visit.    Allergies:  Allergies  Allergen Reactions   Iodine    Bee Venom Hives and Rash   Cefoxitin Rash   Cefuroxime Axetil Hives and Rash   Cucumber Extract Rash   Gadolinium Derivatives Swelling, Rash and Other (See Comments)    NECK BECAME RED AND TONGUE WAS SWELLING SLIGHTLY PER PT   Iodinated Contrast Media Hives, Rash and Other (See Comments)   Latex Rash   Other Hives and Rash    Bee Stings-swelling/hives/rash Wool (textile fiber)-Rash/itching   Tomato Rash and Other (See Comments)    Red tomatoes    Family History: Family History  Problem Relation Age of Onset   Breast cancer Mother 27       Glioblastoma also; deceased at 87   Diabetes Father    Lung cancer Father        mesothelioma; deceased 29s   Cancer Maternal Aunt        "bone ca"; unk. primary   Colon cancer Maternal Grandfather        dx in 75s; deceased 30   Colon cancer Maternal Aunt        dx in 10s; currently 46s   Lung cancer Maternal Grandmother        smoker; deceased 50   Lung cancer Paternal Grandmother        smoker; deceased 46s   Breast cancer Cousin        dx 63s; daughter of mat aunt with unk. primary cancer   Cancer Other        distant cousin; unknown primary   Colon cancer Other        dx 73s; currently 30; maternal half-sister   Bipolar disorder Sister    Schizophrenia Sister     Social History:  reports that she has been smoking cigarettes. She started smoking about 51 years ago. She has a 51.1 pack-year smoking history. She has never used smokeless tobacco. She reports that she does not drink alcohol and does not use drugs.  ROS: Pertinent ROS in HPI  Physical Exam: LMP 08/24/2008   Constitutional:  Well nourished. Alert and oriented, No acute  distress. HEENT: [place] AT, mask in place.   Trachea midline. Cardiovascular: No clubbing, cyanosis, or edema. Respiratory: Normal respiratory effort, no increased work of breathing. Neurologic: Grossly intact, no focal  deficits, moving all 4 extremities. Psychiatric: Normal mood and affect.  Laboratory Data: Lab Results  Component Value Date   WBC 9.5 02/22/2023   HGB 13.8 02/22/2023   HCT 42.1 02/22/2023   MCV 98.4 02/22/2023   PLT 232 02/22/2023    Lab Results  Component Value Date   CREATININE 1.24 (H) 02/20/2023       Component Value Date/Time   CHOL 110 02/20/2023 1327   HDL 41 02/20/2023 1327   CHOLHDL 2.7 02/20/2023 1327   CHOLHDL 2.8 02/21/2021 0346   VLDL 21 02/21/2021 0346   LDLCALC 39 02/20/2023 1327    Urinalysis    Component Value Date/Time   COLORURINE YELLOW (A) 02/20/2021 1730   APPEARANCEUR Cloudy (A) 02/08/2023 1355   LABSPEC 1.013 02/20/2021 1730   PHURINE 7.0 02/20/2021 1730   GLUCOSEU Negative 02/08/2023 1355   HGBUR SMALL (A) 02/20/2021 1730   BILIRUBINUR Negative 02/08/2023 1355   KETONESUR NEGATIVE 02/20/2021 1730   PROTEINUR 1+ (A) 02/08/2023 1355   PROTEINUR NEGATIVE 02/20/2021 1730   UROBILINOGEN 0.2 10/20/2022 1428   NITRITE Negative 02/08/2023 1355   NITRITE NEGATIVE 02/20/2021 1730   LEUKOCYTESUR Trace (A) 02/08/2023 1355   LEUKOCYTESUR TRACE (A) 02/20/2021 1730    Urinalysis: See EPIC and HPI I have reviewed the labs.   Pertinent Imaging: KUB ***, radiologist interpretation still pending  I have independently reviewed the films.    Assessment & Plan:  ***  1. Left renal stone*** - stone sent for analysis ***  2. Microscopic hematuria - UA today demonstrates *** - continue to monitor the patient's UA after the treatment/passage of the stone to ensure the hematuria has resolved - if hematuria persists, we will pursue a hematuria workup with CT Urogram and cystoscopy if appropriate.   No follow-ups on file.  These notes generated with voice recognition software. I apologize for typographical errors.  Cloretta Ned  [facility] [address]  [address] [place], [place] [address] [phone]

## 2023-03-15 ENCOUNTER — Other Ambulatory Visit: Payer: Self-pay | Admitting: Urology

## 2023-03-15 ENCOUNTER — Other Ambulatory Visit: Payer: Self-pay | Admitting: Family

## 2023-03-15 DIAGNOSIS — I6340 Cerebral infarction due to embolism of unspecified cerebral artery: Secondary | ICD-10-CM | POA: Diagnosis not present

## 2023-03-15 DIAGNOSIS — G2581 Restless legs syndrome: Secondary | ICD-10-CM | POA: Diagnosis not present

## 2023-03-15 DIAGNOSIS — G4733 Obstructive sleep apnea (adult) (pediatric): Secondary | ICD-10-CM | POA: Diagnosis not present

## 2023-03-15 DIAGNOSIS — G43119 Migraine with aura, intractable, without status migrainosus: Secondary | ICD-10-CM | POA: Diagnosis not present

## 2023-03-15 DIAGNOSIS — F5101 Primary insomnia: Secondary | ICD-10-CM | POA: Diagnosis not present

## 2023-03-15 DIAGNOSIS — N200 Calculus of kidney: Secondary | ICD-10-CM

## 2023-03-16 ENCOUNTER — Ambulatory Visit
Admission: RE | Admit: 2023-03-16 | Discharge: 2023-03-16 | Disposition: A | Discharge status: Home / Self Care | Payer: 59 | Source: Ambulatory Visit | Attending: Urology | Admitting: Urology

## 2023-03-16 ENCOUNTER — Encounter: Payer: Self-pay | Admitting: Urology

## 2023-03-16 ENCOUNTER — Ambulatory Visit (INDEPENDENT_AMBULATORY_CARE_PROVIDER_SITE_OTHER): Payer: 59 | Admitting: Urology

## 2023-03-16 VITALS — BP 92/66 | HR 74 | Ht 63.0 in | Wt 150.0 lb

## 2023-03-16 DIAGNOSIS — N200 Calculus of kidney: Secondary | ICD-10-CM | POA: Insufficient documentation

## 2023-03-16 DIAGNOSIS — R109 Unspecified abdominal pain: Secondary | ICD-10-CM | POA: Diagnosis not present

## 2023-03-16 DIAGNOSIS — R3129 Other microscopic hematuria: Secondary | ICD-10-CM

## 2023-03-16 DIAGNOSIS — N764 Abscess of vulva: Secondary | ICD-10-CM | POA: Diagnosis not present

## 2023-03-16 DIAGNOSIS — I878 Other specified disorders of veins: Secondary | ICD-10-CM | POA: Diagnosis not present

## 2023-03-16 LAB — URINALYSIS, COMPLETE
Bilirubin, UA: NEGATIVE
Glucose, UA: NEGATIVE
Leukocytes,UA: NEGATIVE
Nitrite, UA: NEGATIVE
Specific Gravity, UA: >1.03 — ABNORMAL HIGH (ref 1.005–1.030)
Urobilinogen, Ur: 1 mg/dL (ref 0.2–1.0)
pH, UA: 5.5 (ref 5.0–7.5)

## 2023-03-16 LAB — MICROSCOPIC EXAMINATION: RBC, Urine: >30 /[HPF] — AB (ref 0–2)

## 2023-03-16 MED ORDER — FLUCONAZOLE 150 MG PO TABS: 150.0000 mg | ORAL_TABLET | Freq: Once | ORAL | 0 refills | Status: AC

## 2023-03-16 MED ORDER — AMOXICILLIN-POT CLAVULANATE 875-125 MG PO TABS: 1.0000 | ORAL_TABLET | Freq: Two times a day (BID) | ORAL | 0 refills | Status: DC

## 2023-03-16 MED ORDER — DOXYCYCLINE HYCLATE 100 MG PO CAPS: 100.0000 mg | ORAL_CAPSULE | Freq: Two times a day (BID) | ORAL | 0 refills | Status: DC

## 2023-03-16 NOTE — Patient Instructions (Signed)
Dr. Lucia [facility] at [facility] Address: [address] [place], [place] [address] Phone: [phone]

## 2023-03-17 ENCOUNTER — Other Ambulatory Visit: Payer: Self-pay | Admitting: Cardiology

## 2023-03-19 LAB — CULTURE, URINE COMPREHENSIVE

## 2023-03-21 ENCOUNTER — Other Ambulatory Visit: Payer: Self-pay | Admitting: Urology

## 2023-03-21 LAB — ANAEROBIC AND AEROBIC CULTURE

## 2023-03-22 ENCOUNTER — Ambulatory Visit: Payer: 59 | Admitting: Obstetrics and Gynecology

## 2023-03-22 ENCOUNTER — Encounter: Payer: Self-pay | Admitting: Cardiology

## 2023-03-23 ENCOUNTER — Other Ambulatory Visit: Payer: Self-pay

## 2023-03-23 DIAGNOSIS — L709 Acne, unspecified: Secondary | ICD-10-CM

## 2023-03-23 DIAGNOSIS — G4700 Insomnia, unspecified: Secondary | ICD-10-CM

## 2023-03-24 MED ORDER — BENZOYL PEROXIDE-ERYTHROMYCIN 5-3 % EX GEL: CUTANEOUS | 0 refills | Status: AC

## 2023-03-24 MED ORDER — PROMETHAZINE HCL 25 MG PO TABS
25.0000 mg | ORAL_TABLET | Freq: Four times a day (QID) | ORAL | 0 refills | Status: DC | PRN
Start: 2023-03-24 — End: 2023-10-26

## 2023-03-24 MED ORDER — TRAZODONE HCL 300 MG PO TABS: 300.0000 mg | ORAL_TABLET | Freq: Every day | ORAL | 0 refills | Status: DC

## 2023-03-24 MED ORDER — EPINEPHRINE 0.3 MG/0.3ML IJ SOAJ: 0.3000 mg | INTRAMUSCULAR | 5 refills | Status: DC | PRN

## 2023-03-24 MED ORDER — ROSUVASTATIN CALCIUM 40 MG PO TABS: 40.0000 mg | ORAL_TABLET | Freq: Every day | ORAL | 0 refills | Status: DC

## 2023-03-24 MED ORDER — CLOPIDOGREL BISULFATE 75 MG PO TABS
75.0000 mg | ORAL_TABLET | Freq: Every day | ORAL | 0 refills | Status: DC
Start: 2023-03-24 — End: 2023-07-31

## 2023-03-28 ENCOUNTER — Other Ambulatory Visit: Payer: Self-pay | Admitting: Urology

## 2023-03-28 DIAGNOSIS — N200 Calculus of kidney: Secondary | ICD-10-CM

## 2023-03-28 NOTE — Progress Notes (Unsigned)
 03/29/2023 8:48 AM   Anderson Malta Sep 07, 1965 [id number]  Referring provider: Marisue Ivan, NP [address] [place],  [place] [address]  Urological history  1. Nephrolithiasis -Remote history of nephrolithiasis with spontaneous passage is in her late 30s  No chief complaint on file.  HPI: Briana [name] is a 58 y.o. who is status post ESWL who presents today for follow up.  Previous records reviewed.     At her visit on 03/16/2023, Underwent ESWL on 02/23/2023 for a  8 mm left renal stone with Dr. Apolinar Junes.  Their postprocedural course was as expected and uneventful.   They have passed fragments, but she did not bring them in for analysis.  She stated she called the office and was told just to throw them away.   She has been having some burning with urination.  She states there is a large ulcer area in her vagina which appears every now and then especially when she drinks orange juice.  KUB the left renal stone is smudged, but there appears to be two fragments in the proximal ureter.  UA yellow slightly cloudy, trace ketone, specific gravity greater than 1.030, 1+ blood, pH 5.5, 2+ protein, 6-10 WBCs, greater than 30 RBCs, hyaline casts present, granular casts present, mucus threads present and moderate bacteria.  Patient denies any modifying or aggravating factors.  Patient denies any recent UTI's, gross hematuria, dysuria or suprapubic/flank pain.  Patient denies any fevers, chills, nausea or vomiting.   I had referred her to Dr. Valentino Saxon for further management of her labial abscess.    KUB ***  UA ***  PMH: Past Medical History:  Diagnosis Date   (HFpEF) heart failure with preserved ejection fraction (HCC)    a.) TTE 11/08/2017: EF 50%, no RWMAs, norm RVSF, RVSP 22.8, triv MR, mild TR; b.) TEE 02/22/2021:; EF 60-65%, no LAA thrombus, ? PFO with (+) IAS within 3-6 cardiac cycles   Allergy    Anxiety    Aortic atherosclerosis (HCC)    Arthritis    Asthma    Bipolar disorder (HCC)     CAD (coronary artery disease) 12/21/2022   a.) LDCT 12/21/2022: age advanced  3 vessel CAD   Chronic pain syndrome    a.) on naloxone; has naloxone Rx available   CKD (chronic kidney disease), stage III (HCC)    COPD (chronic obstructive pulmonary disease) (HCC)    CVA (cerebral vascular accident) (HCC) 02/18/2021   a.) MRI brain 02/18/2021: tiny acute embolic infarct of the LEFT cerebellar hemisphere and LEFT cerebrum areas   CVA (cerebral vascular accident) (HCC) 02/20/2021   a.) MRI brain 02/20/2021: subcentimeter acute infarct RIGHT anterior cerebellar vermis; new since MRI 2 days prior (02/18/2021)   DDD (degenerative disc disease), cervical    DDD (degenerative disc disease), lumbar    Depression    Emphysema lung (HCC)    Fibromyalgia    Genetic testing 03/09/2017   Multi-Cancer panel (83 genes) @ [facility] - No pathogenic mutations detected   GERD (gastroesophageal reflux disease)    History of IBS    HLD (hyperlipidemia)    HTN (hypertension)    Hyperthyroidism    Implantable loop recorder present 01/2021   Insomnia    a.) taskes trazodone PRN   Invasive carcinoma of LEFT breast 2019   a.) clinical stage Ia (ER/PR +, HER2/neu over-expressing) --> Tx'd with surgical resection (lumpectomy) + systemic chemotherapy + XRT + truncated aromotase inhibitor therapy   Lacunar infarction (HCC) 01/29/2019  a.) noted on MRI brain 01/29/2019: 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 (new since 2014 imaging)   Long term current use of opiate analgesic    a.) has naloxone Rx available   Migraine    Nephrolithiasis    On chronic clopidogrel therapy    OSA (obstructive sleep apnea)    a.) unable to tolerate mask required for nocturnal PAP therapy   Patent foramen ovale with atrial septal aneurysm 02/22/2021   a.) TEE 02/22/2021: bubble study with (+) IAS observed within 3-6 cardiac cycles   Personal history of chemotherapy    Personal history of radiation therapy     PTSD (post-traumatic stress disorder)    Restless leg syndrome    a.) on ropinirole    Surgical History: Past Surgical History:  Procedure Laterality Date   ABDOMINAL HYSTERECTOMY  2008   BREAST BIOPSY Left 02/02/2017   [place] core positive   BREAST LUMPECTOMY Left 02/17/2017   BREAST LUMPECTOMY WITH NEEDLE LOCALIZATION Left 02/17/2017   Procedure: BREAST LUMPECTOMY WITH NEEDLE LOCALIZATION;  Surgeon: Ancil Linsey, MD;  Location: [facility];  Service: General;  Laterality: Left;   CARPAL TUNNEL RELEASE Bilateral    cryotherapy     DILATION AND CURETTAGE OF UTERUS     ENDOMETRIAL ABLATION     EXTRACORPOREAL SHOCK WAVE LITHOTRIPSY Left 02/23/2023   Procedure: EXTRACORPOREAL SHOCK WAVE LITHOTRIPSY (ESWL);  Surgeon: [name], MD;  Location: [facility];  Service: Urology;  Laterality: Left;   LAPAROSCOPIC OOPHERECTOMY Left    unsure which side but thinks its the left   LYSIS OF ADHESION Left 10/14/2015   Procedure: LYSIS OF ADHESION;  Surgeon: Juanell Fairly, MD;  Location: [facility];  Service: Orthopedics;  Laterality: Left;   NECK SURGERY     lower neck fusion rods and screws   OOPHORECTOMY     one ovary removed    PORTA CATH INSERTION N/A 03/08/2017   Procedure: PORTA CATH INSERTION;  Surgeon: Renford Dills, MD;  Location: [facility];  Service: Cardiovascular;  Laterality: N/A;   RESECTION DISTAL CLAVICAL Left 10/14/2015   Procedure: RESECTION DISTAL CLAVICAL;  Surgeon: Juanell Fairly, MD;  Location: [facility];  Service: Orthopedics;  Laterality: Left;   SENTINEL NODE BIOPSY Left 02/17/2017   Procedure: SENTINEL NODE BIOPSY;  Surgeon: Ancil Linsey, MD;  Location: [facility];  Service: General;  Laterality: Left;   SHOULDER ARTHROSCOPY WITH OPEN ROTATOR CUFF REPAIR Left 10/14/2015   Procedure: SHOULDER ARTHROSCOPY WITH OPEN ROTATOR CUFF REPAIR;  Surgeon: Juanell Fairly, MD;  Location: [facility];  Service: Orthopedics;  Laterality: Left;   SHOULDER ARTHROSCOPY WITH  OPEN ROTATOR CUFF REPAIR AND DISTAL CLAVICLE ACROMINECTOMY Right 09/03/2014   Procedure: RIGHT SHOULDER ARTHROSCOPY WITH MINI OPEN ROTATOR CUFF TEAR;  Surgeon: Juanell Fairly, MD;  Location: [facility];  Service: Orthopedics;  Laterality: Right;  biceps tenodesis, arthroscopic subacromial decompression and distal clavicle incision   spinal injections     SUBACROMIAL DECOMPRESSION Left 10/14/2015   Procedure: SUBACROMIAL DECOMPRESSION;  Surgeon: Juanell Fairly, MD;  Location: [facility];  Service: Orthopedics;  Laterality: Left;   TEE WITHOUT CARDIOVERSION N/A 02/22/2021   Procedure: TRANSESOPHAGEAL ECHOCARDIOGRAM (TEE);  Surgeon: Lamar Blinks, MD;  Location: [facility];  Service: Cardiovascular;  Laterality: N/A;    Home Medications:  Current Outpatient Medications on File Prior to Visit  Medication Sig Dispense Refill   albuterol (VENTOLIN HFA) 108 (90 Base) MCG/ACT inhaler INHALE 1-2 PUFFS INTO THE LUNGS EVERY 6 HOURS  AS NEEDED FOR WHEEZING OR SHORTNESS OF BREATH 8.5 g 2   amoxicillin-clavulanate (AUGMENTIN) 875-125 MG tablet Take 1 tablet by mouth every 12 (twelve) hours. 14 tablet 0   baclofen (LIORESAL) 10 MG tablet Take 10 mg by mouth 3 (three) times daily as needed for muscle spasms (typically 2-3 times daily).      benzoyl peroxide-erythromycin (BENZAMYCIN) gel Apply to affected area 2 times daily 47 g 0   calcium carbonate (OS-CAL) 600 MG TABS tablet Take by mouth.     cholecalciferol (VITAMIN D) 1000 UNITS tablet Take 1,000 Units by mouth 2 (two) times daily.      clopidogrel (PLAVIX) 75 MG tablet Take 1 tablet (75 mg total) by mouth daily. 90 tablet 0   doxycycline (VIBRAMYCIN) 100 MG capsule Take 1 capsule (100 mg total) by mouth every 12 (twelve) hours. 14 capsule 0   EPINEPHrine 0.3 mg/0.3 mL IJ SOAJ injection Inject 0.3 mg into the muscle as needed for anaphylaxis. 2 each 5   Multiple Vitamins-Minerals (HAIR SKIN AND NAILS FORMULA) TABS Take 2 tablets by mouth daily.     NARCAN 4  MG/0.1ML LIQD nasal spray kit      OXYCODONE HCL PO Take 15 mg by mouth as needed. Take 4-6 times a day as needed.     phytonadione (VITAMIN K) 2 MG/ML SOLN oral solution Take 1 mg by mouth once.     pregabalin (LYRICA) 25 MG capsule Take 1 capsule by mouth every other day.     promethazine (PHENERGAN) 25 MG tablet Take 1 tablet (25 mg total) by mouth every 6 (six) hours as needed for nausea or vomiting. 90 tablet 0   rizatriptan (MAXALT-MLT) 10 MG disintegrating tablet Take 10 mg by mouth as needed for migraine. May repeat in 2 hours if needed     rOPINIRole (REQUIP) 1 MG tablet TAKE 1 TABLET BY MOUTH AT  BEDTIME 90 tablet 3   rosuvastatin (CRESTOR) 40 MG tablet Take 1 tablet (40 mg total) by mouth at bedtime. 90 tablet 0   tamsulosin (FLOMAX) 0.4 MG CAPS capsule TAKE 1 CAPSULE BY MOUTH DAILY 30 capsule 0   topiramate (TOPAMAX) 100 MG tablet Take 200 mg by mouth daily.      trazodone (DESYREL) 300 MG tablet Take 1 tablet (300 mg total) by mouth at bedtime. 90 tablet 0   vitamin E 200 UNIT capsule Take 200 Units by mouth daily.     No current facility-administered medications on file prior to visit.    Allergies:  Allergies  Allergen Reactions   Iodine    Bee Venom Hives and Rash   Cefoxitin Rash   Cefuroxime Axetil Hives and Rash   Cucumber Extract Rash   Gadolinium Derivatives Swelling, Rash and Other (See Comments)    NECK BECAME RED AND TONGUE WAS SWELLING SLIGHTLY PER PT   Iodinated Contrast Media Hives, Rash and Other (See Comments)   Latex Rash   Other Hives and Rash    Bee Stings-swelling/hives/rash Wool (textile fiber)-Rash/itching   Tomato Rash and Other (See Comments)    Red tomatoes    Family History: Family History  Problem Relation Age of Onset   Breast cancer Mother 91       Glioblastoma also; deceased at 44   Diabetes Father    Lung cancer Father        mesothelioma; deceased 47s   Cancer Maternal Aunt        "bone ca"; unk. primary   Colon cancer  Maternal  Grandfather        dx in 80s; deceased 57   Colon cancer Maternal Aunt        dx in 23s; currently 47s   Lung cancer Maternal Grandmother        smoker; deceased 9   Lung cancer Paternal Grandmother        smoker; deceased 4s   Breast cancer Cousin        dx 60s; daughter of mat aunt with unk. primary cancer   Cancer Other        distant cousin; unknown primary   Colon cancer Other        dx 38s; currently 59; maternal half-sister   Bipolar disorder Sister    Schizophrenia Sister     Social History:  reports that she has been smoking cigarettes. She started smoking about 51 years ago. She has a 51.2 pack-year smoking history. She has never used smokeless tobacco. She reports that she does not drink alcohol and does not use drugs.  ROS: Pertinent ROS in HPI  Physical Exam: LMP 08/24/2008   Constitutional:  Well nourished. Alert and oriented, No acute distress. HEENT: [place] AT, moist mucus membranes.  Trachea midline, no masses. Cardiovascular: No clubbing, cyanosis, or edema. Respiratory: Normal respiratory effort, no increased work of breathing. Neurologic: Grossly intact, no focal deficits, moving all 4 extremities. Psychiatric: Normal mood and affect.    Laboratory Data: Urinalysis: See EPIC and HPI I have reviewed the labs.   Pertinent Imaging: KUB stone fragments remaining, radiologist interpretation still pending  I have independently reviewed the films.    Assessment & Plan:    1. Left renal stone -8 mm left renal stone has been smudged and some fragments located in the proximal ureter -She will continue tamsulosin 0.4 mg daily -She will return in 2 weeks for follow-up KUB  2. Microscopic hematuria - UA today demonstrates micro heme  - continue to monitor the patient's UA after the treatment/passage of the stone to ensure the hematuria has resolved - if hematuria persists, we will pursue a hematuria workup with CT Urogram and cystoscopy if appropriate.  3.   Labial abscess -***   No follow-ups on file.  These notes generated with voice recognition software. I apologize for typographical errors.  Cloretta Ned  [facility] [address]  [address] [place], [place] [address] [phone]

## 2023-03-29 ENCOUNTER — Encounter: Payer: Self-pay | Admitting: Urology

## 2023-03-29 ENCOUNTER — Ambulatory Visit
Admission: RE | Admit: 2023-03-29 | Discharge: 2023-03-29 | Disposition: A | Discharge status: Home / Self Care | Payer: 59 | Source: Ambulatory Visit | Attending: Urology | Admitting: Urology

## 2023-03-29 ENCOUNTER — Ambulatory Visit: Payer: 59 | Admitting: Urology

## 2023-03-29 ENCOUNTER — Ambulatory Visit
Admission: RE | Admit: 2023-03-29 | Discharge: 2023-03-29 | Disposition: A | Discharge status: Home / Self Care | Payer: 59 | Attending: Urology | Admitting: Urology

## 2023-03-29 VITALS — BP 97/63 | HR 96

## 2023-03-29 DIAGNOSIS — R3129 Other microscopic hematuria: Secondary | ICD-10-CM | POA: Diagnosis not present

## 2023-03-29 DIAGNOSIS — N200 Calculus of kidney: Secondary | ICD-10-CM | POA: Insufficient documentation

## 2023-03-29 DIAGNOSIS — I878 Other specified disorders of veins: Secondary | ICD-10-CM | POA: Diagnosis not present

## 2023-03-29 DIAGNOSIS — R109 Unspecified abdominal pain: Secondary | ICD-10-CM | POA: Diagnosis not present

## 2023-03-29 LAB — URINALYSIS, COMPLETE
Bilirubin, UA: NEGATIVE
Glucose, UA: NEGATIVE
Leukocytes,UA: NEGATIVE
Nitrite, UA: NEGATIVE
Specific Gravity, UA: >1.03 — ABNORMAL HIGH (ref 1.005–1.030)
Urobilinogen, Ur: 0.2 mg/dL (ref 0.2–1.0)
pH, UA: 5.5 (ref 5.0–7.5)

## 2023-03-29 LAB — MICROSCOPIC EXAMINATION: RBC, Urine: >30 /[HPF] — AB (ref 0–2)

## 2023-03-30 ENCOUNTER — Ambulatory Visit: Payer: 59 | Admitting: Obstetrics and Gynecology

## 2023-04-02 DIAGNOSIS — J449 Chronic obstructive pulmonary disease, unspecified: Secondary | ICD-10-CM | POA: Diagnosis not present

## 2023-04-03 DIAGNOSIS — G894 Chronic pain syndrome: Secondary | ICD-10-CM | POA: Diagnosis not present

## 2023-04-03 DIAGNOSIS — Z79891 Long term (current) use of opiate analgesic: Secondary | ICD-10-CM | POA: Diagnosis not present

## 2023-04-03 DIAGNOSIS — M5134 Other intervertebral disc degeneration, thoracic region: Secondary | ICD-10-CM | POA: Diagnosis not present

## 2023-04-03 DIAGNOSIS — M19019 Primary osteoarthritis, unspecified shoulder: Secondary | ICD-10-CM | POA: Diagnosis not present

## 2023-04-04 ENCOUNTER — Telehealth: Payer: Self-pay | Admitting: Urology

## 2023-04-04 DIAGNOSIS — I639 Cerebral infarction, unspecified: Secondary | ICD-10-CM | POA: Diagnosis not present

## 2023-04-04 DIAGNOSIS — H2513 Age-related nuclear cataract, bilateral: Secondary | ICD-10-CM | POA: Diagnosis not present

## 2023-04-04 DIAGNOSIS — H53482 Generalized contraction of visual field, left eye: Secondary | ICD-10-CM | POA: Diagnosis not present

## 2023-04-04 NOTE — Telephone Encounter (Signed)
 French Ana from [facility] called to see if you have reviewed the results from the [facility] labs that she had done. They would like a call back confirming that you have reviewed the results. Their phone number is [phone].

## 2023-04-04 NOTE — Telephone Encounter (Signed)
 I let French Ana from [facility] radiology know that I got the KUB report.

## 2023-04-11 ENCOUNTER — Other Ambulatory Visit: Payer: Self-pay | Admitting: Urology

## 2023-04-25 ENCOUNTER — Other Ambulatory Visit: Payer: Self-pay | Admitting: Urology

## 2023-04-25 DIAGNOSIS — N200 Calculus of kidney: Secondary | ICD-10-CM

## 2023-04-25 NOTE — Progress Notes (Unsigned)
 04/26/2023 1:33 PM   Briana [name] 1965-04-16 [id number]  Referring provider: Marisue Ivan, NP [address] [place],  [place] [address]  Urological history  1. Nephrolithiasis -Remote history of nephrolithiasis with spontaneous passage is in her late 27s  Chief Complaint  Patient presents with   Follow-up   HPI: Briana [name] is a 58 y.o. who is status post ESWL who presents today for follow up.  Previous records reviewed.     At her visit on 03/16/2023, Underwent ESWL on 02/23/2023 for a  8 mm left renal stone with Dr. Apolinar Junes.  Their postprocedural course was as expected and uneventful.   They have passed fragments, but she did not bring them in for analysis.  She stated she called the office and was told just to throw them away.   She has been having some burning with urination.  She states there is a large ulcer area in her vagina which appears every now and then especially when she drinks orange juice.  KUB the left renal stone is smudged, but there appears to be two fragments in the proximal ureter.  UA yellow slightly cloudy, trace ketone, specific gravity greater than 1.030, 1+ blood, pH 5.5, 2+ protein, 6-10 WBCs, greater than 30 RBCs, hyaline casts present, granular casts present, mucus threads present and moderate bacteria.  Patient denies any modifying or aggravating factors.  Patient denies any recent UTI's, gross hematuria, dysuria or suprapubic/flank pain.  Patient denies any fevers, chills, nausea or vomiting.   I had referred her to Dr. Valentino Saxon for further management of her labial abscess.    At her visit on 04/04/2023, She has not had any further passage of fragments.  She has not had any left-sided flank pain.  Her labial abscess has completely resolved, so she canceled her follow-up with Dr. Valentino Saxon.    Patient denies any modifying or aggravating factors.  Patient denies any recent UTI's, gross hematuria, dysuria or suprapubic/flank pain.  Patient denies any fevers,  chills, nausea or vomiting.  KUB Left fragments still present.  UA yellow slightly cloudy, trace ketone, specific gravity greater than 1.030, 3+ heme, pH 5.5, 1+ protein, 0-5 WBCs, greater than 30 RBCs, 0-10 epithelial cells, hyaline cast present, mucus threads present and moderate bacteria.  She is still having some intermittent left-sided flank pain.  She states is not as severe it is been in the past.  She has some nausea but she states this is common and likely due to her oxycodone.  Patient denies any modifying or aggravating factors.  Patient denies any recent UTI's, gross hematuria, dysuria or suprapubic pain.  Patient denies any fevers, chills or vomiting.    UA yellow slightly cloudy, trace ketone, specific gravity 1.020, pH 5.5, 1+ protein, 6-10 WBCs, 0-2 RBCs, 0-10 epithelial cells, hyaline and granular casts present, mucus threads present and a few bacteria.    KUB a cluster of left lower pole fragments seen   She states she has a very small recurrence of the labial abscess, she states is not fluctuant and she has been soaking it in Epsom salt.  She will let me know if it gets worse.  PMH: Past Medical History:  Diagnosis Date   (HFpEF) heart failure with preserved ejection fraction (HCC)    a.) TTE 11/08/2017: EF 50%, no RWMAs, norm RVSF, RVSP 22.8, triv MR, mild TR; b.) TEE 02/22/2021:; EF 60-65%, no LAA thrombus, ? PFO with (+) IAS within 3-6 cardiac cycles   Allergy  Anxiety    Aortic atherosclerosis (HCC)    Arthritis    Asthma    Bipolar disorder (HCC)    CAD (coronary artery disease) 12/21/2022   a.) LDCT 12/21/2022: age advanced  3 vessel CAD   Chronic pain syndrome    a.) on naloxone; has naloxone Rx available   CKD (chronic kidney disease), stage III (HCC)    COPD (chronic obstructive pulmonary disease) (HCC)    CVA (cerebral vascular accident) (HCC) 02/18/2021   a.) MRI brain 02/18/2021: tiny acute embolic infarct of the LEFT cerebellar hemisphere and LEFT  cerebrum areas   CVA (cerebral vascular accident) (HCC) 02/20/2021   a.) MRI brain 02/20/2021: subcentimeter acute infarct RIGHT anterior cerebellar vermis; new since MRI 2 days prior (02/18/2021)   DDD (degenerative disc disease), cervical    DDD (degenerative disc disease), lumbar    Depression    Emphysema lung (HCC)    Fibromyalgia    Genetic testing 03/09/2017   Multi-Cancer panel (83 genes) @ [facility] - No pathogenic mutations detected   GERD (gastroesophageal reflux disease)    History of IBS    HLD (hyperlipidemia)    HTN (hypertension)    Hyperthyroidism    Implantable loop recorder present 01/2021   Insomnia    a.) taskes trazodone PRN   Invasive carcinoma of LEFT breast 2019   a.) clinical stage Ia (ER/PR +, HER2/neu over-expressing) --> Tx'd with surgical resection (lumpectomy) + systemic chemotherapy + XRT + truncated aromotase inhibitor therapy   Lacunar infarction (HCC) 01/29/2019   a.) noted on MRI brain 01/29/2019: 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 (new since 2014 imaging)   Long term current use of opiate analgesic    a.) has naloxone Rx available   Migraine    Nephrolithiasis    On chronic clopidogrel therapy    OSA (obstructive sleep apnea)    a.) unable to tolerate mask required for nocturnal PAP therapy   Patent foramen ovale with atrial septal aneurysm 02/22/2021   a.) TEE 02/22/2021: bubble study with (+) IAS observed within 3-6 cardiac cycles   Personal history of chemotherapy    Personal history of radiation therapy    PTSD (post-traumatic stress disorder)    Restless leg syndrome    a.) on ropinirole    Surgical History: Past Surgical History:  Procedure Laterality Date   ABDOMINAL HYSTERECTOMY  2008   BREAST BIOPSY Left 02/02/2017   [place] core positive   BREAST LUMPECTOMY Left 02/17/2017   BREAST LUMPECTOMY WITH NEEDLE LOCALIZATION Left 02/17/2017   Procedure: BREAST LUMPECTOMY WITH NEEDLE LOCALIZATION;  Surgeon: Ancil Linsey, MD;  Location: [facility];  Service: General;  Laterality: Left;   CARPAL TUNNEL RELEASE Bilateral    cryotherapy     DILATION AND CURETTAGE OF UTERUS     ENDOMETRIAL ABLATION     EXTRACORPOREAL SHOCK WAVE LITHOTRIPSY Left 02/23/2023   Procedure: EXTRACORPOREAL SHOCK WAVE LITHOTRIPSY (ESWL);  Surgeon: [name], MD;  Location: [facility];  Service: Urology;  Laterality: Left;   LAPAROSCOPIC OOPHERECTOMY Left    unsure which side but thinks its the left   LYSIS OF ADHESION Left 10/14/2015   Procedure: LYSIS OF ADHESION;  Surgeon: Juanell Fairly, MD;  Location: [facility];  Service: Orthopedics;  Laterality: Left;   NECK SURGERY     lower neck fusion rods and screws   OOPHORECTOMY     one ovary removed    PORTA CATH INSERTION N/A 03/08/2017   Procedure: PORTA CATH  INSERTION;  Surgeon: Renford Dills, MD;  Location: [facility];  Service: Cardiovascular;  Laterality: N/A;   RESECTION DISTAL CLAVICAL Left 10/14/2015   Procedure: RESECTION DISTAL CLAVICAL;  Surgeon: Juanell Fairly, MD;  Location: [facility];  Service: Orthopedics;  Laterality: Left;   SENTINEL NODE BIOPSY Left 02/17/2017   Procedure: SENTINEL NODE BIOPSY;  Surgeon: Ancil Linsey, MD;  Location: [facility];  Service: General;  Laterality: Left;   SHOULDER ARTHROSCOPY WITH OPEN ROTATOR CUFF REPAIR Left 10/14/2015   Procedure: SHOULDER ARTHROSCOPY WITH OPEN ROTATOR CUFF REPAIR;  Surgeon: Juanell Fairly, MD;  Location: [facility];  Service: Orthopedics;  Laterality: Left;   SHOULDER ARTHROSCOPY WITH OPEN ROTATOR CUFF REPAIR AND DISTAL CLAVICLE ACROMINECTOMY Right 09/03/2014   Procedure: RIGHT SHOULDER ARTHROSCOPY WITH MINI OPEN ROTATOR CUFF TEAR;  Surgeon: Juanell Fairly, MD;  Location: [facility];  Service: Orthopedics;  Laterality: Right;  biceps tenodesis, arthroscopic subacromial decompression and distal clavicle incision   spinal injections     SUBACROMIAL DECOMPRESSION Left 10/14/2015   Procedure: SUBACROMIAL  DECOMPRESSION;  Surgeon: Juanell Fairly, MD;  Location: [facility];  Service: Orthopedics;  Laterality: Left;   TEE WITHOUT CARDIOVERSION N/A 02/22/2021   Procedure: TRANSESOPHAGEAL ECHOCARDIOGRAM (TEE);  Surgeon: Lamar Blinks, MD;  Location: [facility];  Service: Cardiovascular;  Laterality: N/A;    Home Medications:  Current Outpatient Medications on File Prior to Visit  Medication Sig Dispense Refill   albuterol (VENTOLIN HFA) 108 (90 Base) MCG/ACT inhaler INHALE 1-2 PUFFS INTO THE LUNGS EVERY 6 HOURS AS NEEDED FOR WHEEZING OR SHORTNESS OF BREATH 8.5 g 2   B Complex Vitamins (VITAMIN B COMPLEX) CAPS Take by mouth.     baclofen (LIORESAL) 10 MG tablet Take 10 mg by mouth 3 (three) times daily as needed for muscle spasms (typically 2-3 times daily).      benzoyl peroxide-erythromycin (BENZAMYCIN) gel Apply to affected area 2 times daily 47 g 0   Beta Carotene (VITAMIN A) 25000 UNIT capsule Take 25,000 Units by mouth daily.     calcium carbonate (OS-CAL) 600 MG TABS tablet Take by mouth.     cholecalciferol (VITAMIN D) 1000 UNITS tablet Take 1,000 Units by mouth 2 (two) times daily.      clopidogrel (PLAVIX) 75 MG tablet Take 1 tablet (75 mg total) by mouth daily. 90 tablet 0   EPINEPHrine 0.3 mg/0.3 mL IJ SOAJ injection Inject 0.3 mg into the muscle as needed for anaphylaxis. 2 each 5   Multiple Vitamins-Minerals (HAIR SKIN AND NAILS FORMULA) TABS Take 2 tablets by mouth daily.     NARCAN 4 MG/0.1ML LIQD nasal spray kit      OXYCODONE HCL PO Take 15 mg by mouth as needed. Take 4-6 times a day as needed.     pregabalin (LYRICA) 25 MG capsule Take 1 capsule by mouth every other day.     promethazine (PHENERGAN) 25 MG tablet Take 1 tablet (25 mg total) by mouth every 6 (six) hours as needed for nausea or vomiting. 90 tablet 0   rizatriptan (MAXALT-MLT) 10 MG disintegrating tablet Take 10 mg by mouth as needed for migraine. May repeat in 2 hours if needed     rOPINIRole (REQUIP) 1 MG tablet TAKE  1 TABLET BY MOUTH AT  BEDTIME 90 tablet 3   rosuvastatin (CRESTOR) 40 MG tablet Take 1 tablet (40 mg total) by mouth at bedtime. 90 tablet 0   spironolactone (ALDACTONE) 100 MG tablet Take 100 mg by mouth daily.  tamsulosin (FLOMAX) 0.4 MG CAPS capsule TAKE 1 CAPSULE BY MOUTH DAILY 30 capsule 0   topiramate (TOPAMAX) 100 MG tablet Take 200 mg by mouth daily.      trazodone (DESYREL) 300 MG tablet Take 1 tablet (300 mg total) by mouth at bedtime. 90 tablet 0   valACYclovir (VALTREX) 1000 MG tablet Take 1,000 mg by mouth 3 (three) times daily.     vitamin E 200 UNIT capsule Take 200 Units by mouth daily.     No current facility-administered medications on file prior to visit.    Allergies:  Allergies  Allergen Reactions   Iodine    Bee Venom Hives and Rash   Cefoxitin Rash   Cefuroxime Axetil Hives and Rash   Cucumber Extract Rash   Gadolinium Derivatives Swelling, Rash and Other (See Comments)    NECK BECAME RED AND TONGUE WAS SWELLING SLIGHTLY PER PT   Iodinated Contrast Media Hives, Rash and Other (See Comments)   Latex Rash   Other Hives and Rash    Bee Stings-swelling/hives/rash Wool (textile fiber)-Rash/itching   Tomato Rash and Other (See Comments)    Red tomatoes    Family History: Family History  Problem Relation Age of Onset   Breast cancer Mother 59       Glioblastoma also; deceased at 58   Diabetes Father    Lung cancer Father        mesothelioma; deceased 6s   Cancer Maternal Aunt        "bone ca"; unk. primary   Colon cancer Maternal Grandfather        dx in 109s; deceased 73   Colon cancer Maternal Aunt        dx in 33s; currently 25s   Lung cancer Maternal Grandmother        smoker; deceased 51   Lung cancer Paternal Grandmother        smoker; deceased 103s   Breast cancer Cousin        dx 7s; daughter of mat aunt with unk. primary cancer   Cancer Other        distant cousin; unknown primary   Colon cancer Other        dx 86s; currently 92;  maternal half-sister   Bipolar disorder Sister    Schizophrenia Sister     Social History:  reports that she has been smoking cigarettes. She started smoking about 51 years ago. She has a 51.2 pack-year smoking history. She has never used smokeless tobacco. She reports that she does not drink alcohol and does not use drugs.  ROS: Pertinent ROS in HPI  Physical Exam: BP 102/67   Pulse (!) 101   Ht 5\' 3"  (1.6 m)   Wt 152 lb 4.8 oz (69.1 kg)   LMP 08/24/2008   BMI 26.98 kg/m   Constitutional:  Well nourished. Alert and oriented, No acute distress. HEENT: [place] AT, moist mucus membranes.  Trachea midline Cardiovascular: No clubbing, cyanosis, or edema. Respiratory: Normal respiratory effort, no increased work of breathing. Neurologic: Grossly intact, no focal deficits, moving all 4 extremities. Psychiatric: Normal mood and affect.    Laboratory Data: Urinalysis: See EPIC and HPI I have reviewed the labs.   Pertinent Imaging: KUB a cluster of left lower pole, radiologist interpretation still pending  I have independently reviewed the films.    Assessment & Plan:    1. Left renal stone -8 mm left renal stone has been smudged and now fragments are located in the  lower pole of the left kidney  -She will continue tamsulosin 0.4 mg daily -Since she continues to have intermittent left-sided flank pain, we will go ahead and schedule CT renogram to ensure that there are no remaining left ureteral fragments and no hydronephrosis  2. Microscopic hematuria - UA today demonstrates no micro heme   3.  Labial abscess -Small recurrence, she states that she does not feel there is enough and there to cause a fluctuant mass, she will let me know if it worsens   Return in about 3 weeks (around 05/17/2023) for CT renal stone study .  These notes generated with voice recognition software. I apologize for typographical errors.  Cloretta Ned  [facility] [address]  [address] [place], [place] [address] [phone]

## 2023-04-26 ENCOUNTER — Ambulatory Visit
Admission: RE | Admit: 2023-04-26 | Discharge: 2023-04-26 | Disposition: A | Discharge status: Home / Self Care | Source: Ambulatory Visit | Attending: Urology | Admitting: Urology

## 2023-04-26 ENCOUNTER — Encounter: Payer: Self-pay | Admitting: Urology

## 2023-04-26 ENCOUNTER — Ambulatory Visit (INDEPENDENT_AMBULATORY_CARE_PROVIDER_SITE_OTHER): Payer: 59 | Admitting: Urology

## 2023-04-26 VITALS — BP 102/67 | HR 101 | Ht 63.0 in | Wt 152.3 lb

## 2023-04-26 DIAGNOSIS — N200 Calculus of kidney: Secondary | ICD-10-CM | POA: Diagnosis not present

## 2023-04-26 DIAGNOSIS — R3129 Other microscopic hematuria: Secondary | ICD-10-CM | POA: Diagnosis not present

## 2023-04-26 DIAGNOSIS — N764 Abscess of vulva: Secondary | ICD-10-CM

## 2023-04-26 LAB — URINALYSIS, COMPLETE
Bilirubin, UA: NEGATIVE
Glucose, UA: NEGATIVE
Leukocytes,UA: NEGATIVE
Nitrite, UA: NEGATIVE
RBC, UA: NEGATIVE
Specific Gravity, UA: 1.02 (ref 1.005–1.030)
Urobilinogen, Ur: 0.2 mg/dL (ref 0.2–1.0)
pH, UA: 5.5 (ref 5.0–7.5)

## 2023-04-26 LAB — MICROSCOPIC EXAMINATION

## 2023-04-29 LAB — CULTURE, URINE COMPREHENSIVE

## 2023-05-01 ENCOUNTER — Ambulatory Visit
Admission: RE | Admit: 2023-05-01 | Discharge: 2023-05-01 | Disposition: A | Discharge status: Home / Self Care | Payer: 59 | Source: Ambulatory Visit | Attending: Nurse Practitioner

## 2023-05-01 ENCOUNTER — Ambulatory Visit
Admission: RE | Admit: 2023-05-01 | Discharge: 2023-05-01 | Disposition: A | Discharge status: Home / Self Care | Payer: 59 | Source: Ambulatory Visit | Attending: Nurse Practitioner | Admitting: Nurse Practitioner

## 2023-05-01 DIAGNOSIS — Z780 Asymptomatic menopausal state: Secondary | ICD-10-CM | POA: Diagnosis not present

## 2023-05-01 DIAGNOSIS — M8580 Other specified disorders of bone density and structure, unspecified site: Secondary | ICD-10-CM | POA: Insufficient documentation

## 2023-05-01 DIAGNOSIS — Z853 Personal history of malignant neoplasm of breast: Secondary | ICD-10-CM | POA: Diagnosis not present

## 2023-05-01 DIAGNOSIS — F17210 Nicotine dependence, cigarettes, uncomplicated: Secondary | ICD-10-CM | POA: Insufficient documentation

## 2023-05-01 DIAGNOSIS — Z9221 Personal history of antineoplastic chemotherapy: Secondary | ICD-10-CM | POA: Insufficient documentation

## 2023-05-01 DIAGNOSIS — Z923 Personal history of irradiation: Secondary | ICD-10-CM | POA: Insufficient documentation

## 2023-05-01 DIAGNOSIS — M8589 Other specified disorders of bone density and structure, multiple sites: Secondary | ICD-10-CM | POA: Diagnosis not present

## 2023-05-01 DIAGNOSIS — J449 Chronic obstructive pulmonary disease, unspecified: Secondary | ICD-10-CM | POA: Diagnosis not present

## 2023-05-01 DIAGNOSIS — M797 Fibromyalgia: Secondary | ICD-10-CM | POA: Insufficient documentation

## 2023-05-01 DIAGNOSIS — M069 Rheumatoid arthritis, unspecified: Secondary | ICD-10-CM | POA: Diagnosis not present

## 2023-05-01 DIAGNOSIS — Z1231 Encounter for screening mammogram for malignant neoplasm of breast: Secondary | ICD-10-CM | POA: Insufficient documentation

## 2023-05-01 DIAGNOSIS — Z08 Encounter for follow-up examination after completed treatment for malignant neoplasm: Secondary | ICD-10-CM

## 2023-05-02 DIAGNOSIS — G894 Chronic pain syndrome: Secondary | ICD-10-CM | POA: Diagnosis not present

## 2023-05-02 DIAGNOSIS — Z79891 Long term (current) use of opiate analgesic: Secondary | ICD-10-CM | POA: Diagnosis not present

## 2023-05-02 DIAGNOSIS — M5134 Other intervertebral disc degeneration, thoracic region: Secondary | ICD-10-CM | POA: Diagnosis not present

## 2023-05-02 DIAGNOSIS — M19019 Primary osteoarthritis, unspecified shoulder: Secondary | ICD-10-CM | POA: Diagnosis not present

## 2023-05-03 ENCOUNTER — Other Ambulatory Visit: Payer: Self-pay | Admitting: Nurse Practitioner

## 2023-05-03 DIAGNOSIS — J449 Chronic obstructive pulmonary disease, unspecified: Secondary | ICD-10-CM | POA: Diagnosis not present

## 2023-05-04 ENCOUNTER — Other Ambulatory Visit: Payer: Self-pay

## 2023-05-04 MED ORDER — ALBUTEROL SULFATE HFA 108 (90 BASE) MCG/ACT IN AERS
1.0000 | INHALATION_SPRAY | Freq: Four times a day (QID) | RESPIRATORY_TRACT | 3 refills | Status: DC | PRN
Start: 2023-05-04 — End: 2023-05-29

## 2023-05-09 ENCOUNTER — Other Ambulatory Visit: Payer: Self-pay | Admitting: Urology

## 2023-05-15 ENCOUNTER — Encounter: Payer: Self-pay | Admitting: Cardiology

## 2023-05-15 DIAGNOSIS — N1832 Chronic kidney disease, stage 3b: Secondary | ICD-10-CM | POA: Diagnosis not present

## 2023-05-28 ENCOUNTER — Other Ambulatory Visit: Payer: Self-pay | Admitting: Cardiology

## 2023-05-29 ENCOUNTER — Other Ambulatory Visit: Payer: Self-pay

## 2023-05-29 MED ORDER — ALBUTEROL SULFATE HFA 108 (90 BASE) MCG/ACT IN AERS
1.0000 | INHALATION_SPRAY | Freq: Four times a day (QID) | RESPIRATORY_TRACT | 3 refills | Status: DC | PRN
Start: 2023-05-29 — End: 2023-08-07

## 2023-05-30 ENCOUNTER — Encounter: Payer: Self-pay | Admitting: Urology

## 2023-05-30 ENCOUNTER — Ambulatory Visit (INDEPENDENT_AMBULATORY_CARE_PROVIDER_SITE_OTHER): Admitting: Urology

## 2023-05-30 VITALS — BP 106/72 | HR 116 | Ht 63.0 in | Wt 148.4 lb

## 2023-05-30 DIAGNOSIS — N200 Calculus of kidney: Secondary | ICD-10-CM

## 2023-05-30 DIAGNOSIS — M19019 Primary osteoarthritis, unspecified shoulder: Secondary | ICD-10-CM | POA: Diagnosis not present

## 2023-05-30 DIAGNOSIS — Z79891 Long term (current) use of opiate analgesic: Secondary | ICD-10-CM | POA: Diagnosis not present

## 2023-05-30 DIAGNOSIS — G894 Chronic pain syndrome: Secondary | ICD-10-CM | POA: Diagnosis not present

## 2023-05-30 DIAGNOSIS — M5134 Other intervertebral disc degeneration, thoracic region: Secondary | ICD-10-CM | POA: Diagnosis not present

## 2023-05-30 NOTE — Progress Notes (Signed)
 05/30/2023 9:46 PM   Briana [name] May 12, 1965 [id number]  Referring provider: Alica Antu, NP [address] [place],  [place] [address]  Urological history: 1.  Nephrolithiasis - Remote history of nephrolithiasis with spontaneous passage in her late 7s  Chief Complaint  Patient presents with   Results   HPI: Briana [name] is a 58 y.o. woman who presents today for CT results.  Previous records reviewed.   When I saw Briana [name] on April 26, 2023, she was still having intermittent left-sided flank pain.  I recommended a CT renal stone study for further evaluation, but this has not been scheduled at the time of this appointment.  Today she is feeling well.  Patient denies any modifying or aggravating factors.  Patient denies any recent UTI's, gross hematuria, dysuria or suprapubic/flank pain.  Patient denies any fevers, chills, nausea or vomiting.    PMH: Past Medical History:  Diagnosis Date   (HFpEF) heart failure with preserved ejection fraction (HCC)    a.) TTE 11/08/2017: EF 50%, no RWMAs, norm RVSF, RVSP 22.8, triv MR, mild TR; b.) TEE 02/22/2021:; EF 60-65%, no LAA thrombus, ? PFO with (+) IAS within 3-6 cardiac cycles   Allergy    Anxiety    Aortic atherosclerosis (HCC)    Arthritis    Asthma    Bipolar disorder (HCC)    CAD (coronary artery disease) 12/21/2022   a.) LDCT 12/21/2022: age advanced  3 vessel CAD   Chronic pain syndrome    a.) on naloxone ; has naloxone  Rx available   CKD (chronic kidney disease), stage III (HCC)    COPD (chronic obstructive pulmonary disease) (HCC)    CVA (cerebral vascular accident) (HCC) 02/18/2021   a.) MRI brain 02/18/2021: tiny acute embolic infarct of the LEFT cerebellar hemisphere and LEFT cerebrum areas   CVA (cerebral vascular accident) (HCC) 02/20/2021   a.) MRI brain 02/20/2021: subcentimeter acute infarct RIGHT anterior cerebellar vermis; new since MRI 2 days prior (02/18/2021)   DDD (degenerative disc disease),  cervical    DDD (degenerative disc disease), lumbar    Depression    Emphysema lung (HCC)    Fibromyalgia    Genetic testing 03/09/2017   Multi-Cancer panel (83 genes) @ [facility] - No pathogenic mutations detected   GERD (gastroesophageal reflux disease)    History of IBS    HLD (hyperlipidemia)    HTN (hypertension)    Hyperthyroidism    Implantable loop recorder present 01/2021   Insomnia    a.) taskes trazodone  PRN   Invasive carcinoma of LEFT breast 2019   a.) clinical stage Ia (ER/PR +, HER2/neu over-expressing) --> Tx'd with surgical resection (lumpectomy) + systemic chemotherapy + XRT + truncated aromotase inhibitor therapy   Lacunar infarction (HCC) 01/29/2019   a.) noted on MRI brain 01/29/2019: 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 (new since 2014 imaging)   Long term current use of opiate analgesic    a.) has naloxone  Rx available   Migraine    Nephrolithiasis    On chronic clopidogrel  therapy    OSA (obstructive sleep apnea)    a.) unable to tolerate mask required for nocturnal PAP therapy   Patent foramen ovale with atrial septal aneurysm 02/22/2021   a.) TEE 02/22/2021: bubble study with (+) IAS observed within 3-6 cardiac cycles   Personal history of chemotherapy    Personal history of radiation therapy    PTSD (post-traumatic stress disorder)    Restless leg syndrome  a.) on ropinirole     Surgical History: Past Surgical History:  Procedure Laterality Date   ABDOMINAL HYSTERECTOMY  2008   BREAST BIOPSY Left 02/02/2017   us  core positive   BREAST LUMPECTOMY Left 02/17/2017   BREAST LUMPECTOMY WITH NEEDLE LOCALIZATION Left 02/17/2017   Procedure: BREAST LUMPECTOMY WITH NEEDLE LOCALIZATION;  Surgeon: Franki Isles, MD;  Location: [facility];  Service: General;  Laterality: Left;   CARPAL TUNNEL RELEASE Bilateral    cryotherapy     DILATION AND CURETTAGE OF UTERUS     ENDOMETRIAL ABLATION     EXTRACORPOREAL SHOCK WAVE LITHOTRIPSY  Left 02/23/2023   Procedure: EXTRACORPOREAL SHOCK WAVE LITHOTRIPSY (ESWL);  Surgeon: Dustin Gimenez, MD;  Location: [facility];  Service: Urology;  Laterality: Left;   LAPAROSCOPIC OOPHERECTOMY Left    unsure which side but thinks its the left   LYSIS OF ADHESION Left 10/14/2015   Procedure: LYSIS OF ADHESION;  Surgeon: Rande Bushy, MD;  Location: [facility];  Service: Orthopedics;  Laterality: Left;   NECK SURGERY     lower neck fusion rods and screws   OOPHORECTOMY     one ovary removed    PORTA CATH INSERTION N/A 03/08/2017   Procedure: PORTA CATH INSERTION;  Surgeon: Jackquelyn Mass, MD;  Location: [facility];  Service: Cardiovascular;  Laterality: N/A;   RESECTION DISTAL CLAVICAL Left 10/14/2015   Procedure: RESECTION DISTAL CLAVICAL;  Surgeon: Rande Bushy, MD;  Location: [facility];  Service: Orthopedics;  Laterality: Left;   SENTINEL NODE BIOPSY Left 02/17/2017   Procedure: SENTINEL NODE BIOPSY;  Surgeon: Franki Isles, MD;  Location: [facility];  Service: General;  Laterality: Left;   SHOULDER ARTHROSCOPY WITH OPEN ROTATOR CUFF REPAIR Left 10/14/2015   Procedure: SHOULDER ARTHROSCOPY WITH OPEN ROTATOR CUFF REPAIR;  Surgeon: Rande Bushy, MD;  Location: [facility];  Service: Orthopedics;  Laterality: Left;   SHOULDER ARTHROSCOPY WITH OPEN ROTATOR CUFF REPAIR AND DISTAL CLAVICLE ACROMINECTOMY Right 09/03/2014   Procedure: RIGHT SHOULDER ARTHROSCOPY WITH MINI OPEN ROTATOR CUFF TEAR;  Surgeon: Rande Bushy, MD;  Location: [facility];  Service: Orthopedics;  Laterality: Right;  biceps tenodesis, arthroscopic subacromial decompression and distal clavicle incision   spinal injections     SUBACROMIAL DECOMPRESSION Left 10/14/2015   Procedure: SUBACROMIAL DECOMPRESSION;  Surgeon: Rande Bushy, MD;  Location: [facility];  Service: Orthopedics;  Laterality: Left;   TEE WITHOUT CARDIOVERSION N/A 02/22/2021   Procedure: TRANSESOPHAGEAL ECHOCARDIOGRAM (TEE);  Surgeon: Michelle Aid,  MD;  Location: [facility];  Service: Cardiovascular;  Laterality: N/A;    Home Medications:  Allergies as of 05/30/2023       Reactions   Iodine    Bee Venom Hives, Rash   Cefoxitin Rash   Cefuroxime Axetil Hives, Rash   Cucumber Extract Rash   Gadolinium Derivatives Swelling, Rash, Other (See Comments)   NECK BECAME RED AND TONGUE WAS SWELLING SLIGHTLY PER PT   Iodinated Contrast Media Hives, Rash, Other (See Comments)   Latex Rash   Other Hives, Rash   Bee Stings-swelling/hives/rash Wool (textile fiber)-Rash/itching   Tomato Rash, Other (See Comments)   Red tomatoes        Medication List        Accurate as of May 30, 2023  9:46 PM. If you have any questions, ask your nurse or doctor.          STOP taking these medications    tamsulosin  0.4 MG Caps capsule Commonly known as: FLOMAX   TAKE these medications    albuterol  108 (90 Base) MCG/ACT inhaler Commonly known as: VENTOLIN  HFA Inhale 1-2 puffs into the lungs every 6 (six) hours as needed for wheezing or shortness of breath.   baclofen  10 MG tablet Commonly known as: LIORESAL  Take 10 mg by mouth 2 (two) times daily.   benzoyl peroxide -erythromycin  gel Commonly known as: Benzamycin Apply to affected area 2 times daily   calcium  carbonate 600 MG Tabs tablet Commonly known as: OS-CAL Take by mouth.   cholecalciferol  1000 units tablet Commonly known as: VITAMIN D  Take 1,000 Units by mouth 2 (two) times daily.   clopidogrel  75 MG tablet Commonly known as: PLAVIX  Take 1 tablet (75 mg total) by mouth daily.   EPINEPHrine  0.3 mg/0.3 mL Soaj injection Commonly known as: EPI-PEN Inject 0.3 mg into the muscle as needed for anaphylaxis.   Hair Skin and Nails Formula Tabs Take 2 tablets by mouth daily.   Narcan  4 MG/0.1ML Liqd nasal spray kit Generic drug: naloxone    OXYCODONE  HCL PO Take 15 mg by mouth as needed. Take 4 times a day as needed.   pregabalin  25 MG capsule Commonly known as:  LYRICA  Take 1 capsule by mouth every other day.   promethazine  25 MG tablet Commonly known as: PHENERGAN  Take 1 tablet (25 mg total) by mouth every 6 (six) hours as needed for nausea or vomiting.   rizatriptan 10 MG disintegrating tablet Commonly known as: MAXALT-MLT Take 10 mg by mouth as needed for migraine. May repeat in 2 hours if needed   rOPINIRole  1 MG tablet Commonly known as: REQUIP  TAKE 1 TABLET BY MOUTH AT  BEDTIME   rosuvastatin  40 MG tablet Commonly known as: CRESTOR  TAKE 1 TABLET BY MOUTH AT  BEDTIME   spironolactone 100 MG tablet Commonly known as: ALDACTONE Take 100 mg by mouth daily.   topiramate  100 MG tablet Commonly known as: TOPAMAX  Take 200 mg by mouth daily.   trazodone  300 MG tablet Commonly known as: DESYREL  Take 1 tablet (300 mg total) by mouth at bedtime.   valACYclovir  1000 MG tablet Commonly known as: VALTREX  Take 1,000 mg by mouth 3 (three) times daily.   vitamin A 25000 UNIT capsule Take 25,000 Units by mouth daily.   Vitamin B Complex Caps Take by mouth.   vitamin E  200 UNIT capsule Take 200 Units by mouth daily.        Allergies:  Allergies  Allergen Reactions   Iodine    Bee Venom Hives and Rash   Cefoxitin Rash   Cefuroxime Axetil Hives and Rash   Cucumber Extract Rash   Gadolinium Derivatives Swelling, Rash and Other (See Comments)    NECK BECAME RED AND TONGUE WAS SWELLING SLIGHTLY PER PT   Iodinated Contrast Media Hives, Rash and Other (See Comments)   Latex Rash   Other Hives and Rash    Bee Stings-swelling/hives/rash Wool (textile fiber)-Rash/itching   Tomato Rash and Other (See Comments)    Red tomatoes    Family History: Family History  Problem Relation Age of Onset   Breast cancer Mother 67       Glioblastoma also; deceased at 23   Diabetes Father    Lung cancer Father        mesothelioma; deceased 66s   Cancer Maternal Aunt        "bone ca"; unk. primary   Colon cancer Maternal Grandfather         dx in 22s; deceased 30   Colon  cancer Maternal Aunt        dx in 19s; currently 49s   Lung cancer Maternal Grandmother        smoker; deceased 50   Lung cancer Paternal Grandmother        smoker; deceased 26s   Breast cancer Cousin        dx 38s; daughter of mat aunt with unk. primary cancer   Cancer Other        distant cousin; unknown primary   Colon cancer Other        dx 36s; currently 31; maternal half-sister   Bipolar disorder Sister    Schizophrenia Sister     Social History:  reports that she has been smoking cigarettes. She started smoking about 51 years ago. She has a 51.3 pack-year smoking history. She has never used smokeless tobacco. She reports that she does not drink alcohol and does not use drugs.  ROS: Pertinent ROS in HPI  Physical Exam: BP 106/72   Pulse (!) 116   Ht 5\' 3"  (1.6 m)   Wt 148 lb 6.4 oz (67.3 kg)   LMP 08/24/2008   BMI 26.29 kg/m   Constitutional:  Well nourished. Alert and oriented, No acute distress. HEENT: [place] AT, moist mucus membranes.  Trachea midline Cardiovascular: No clubbing, cyanosis, or edema. Respiratory: Normal respiratory effort, no increased work of breathing. Neurologic: Grossly intact, no focal deficits, moving all 4 extremities. Psychiatric: Normal mood and affect.  Laboratory Data: N/A   Pertinent Imaging: N/A  Assessment & Plan:    1. Left nephrolithiasis (Primary) -Her previous KUB demonstrated left over fragments in her left kidney and because she is stage III kidney disease, I would like to order ultrasound to ensure there is no persistent obstruction of the kidney by these leftover fragments - She is very reluctant to undergo any type of uteroscopy, but I did explain to her that we always want to try to preserve as much renal function as possible and if the ultrasound does demonstrate hydronephrosis we will need to intervene - US  RENAL; Future   Return for I will call patient with results.  These notes  generated with voice recognition software. I apologize for typographical errors.  Briant Camper  [facility] [address]  [address] [place], [place] [address] [phone]

## 2023-06-02 DIAGNOSIS — J449 Chronic obstructive pulmonary disease, unspecified: Secondary | ICD-10-CM | POA: Diagnosis not present

## 2023-06-05 ENCOUNTER — Other Ambulatory Visit: Payer: Self-pay | Admitting: Cardiology

## 2023-06-05 DIAGNOSIS — G4700 Insomnia, unspecified: Secondary | ICD-10-CM

## 2023-06-08 ENCOUNTER — Ambulatory Visit
Admission: RE | Admit: 2023-06-08 | Discharge: 2023-06-08 | Disposition: A | Discharge status: Home / Self Care | Source: Ambulatory Visit | Attending: Urology | Admitting: Urology

## 2023-06-08 DIAGNOSIS — N281 Cyst of kidney, acquired: Secondary | ICD-10-CM | POA: Diagnosis not present

## 2023-06-08 DIAGNOSIS — N200 Calculus of kidney: Secondary | ICD-10-CM | POA: Diagnosis not present

## 2023-06-20 ENCOUNTER — Encounter: Payer: Self-pay | Admitting: Cardiology

## 2023-06-20 ENCOUNTER — Ambulatory Visit (INDEPENDENT_AMBULATORY_CARE_PROVIDER_SITE_OTHER): Payer: 59 | Admitting: Cardiology

## 2023-06-20 VITALS — BP 106/60 | HR 114 | Ht 63.0 in | Wt 148.0 lb

## 2023-06-20 DIAGNOSIS — R7303 Prediabetes: Secondary | ICD-10-CM

## 2023-06-20 DIAGNOSIS — Z0130 Encounter for examination of blood pressure without abnormal findings: Secondary | ICD-10-CM

## 2023-06-20 DIAGNOSIS — Z1329 Encounter for screening for other suspected endocrine disorder: Secondary | ICD-10-CM

## 2023-06-20 DIAGNOSIS — E782 Mixed hyperlipidemia: Secondary | ICD-10-CM | POA: Diagnosis not present

## 2023-06-20 DIAGNOSIS — N182 Chronic kidney disease, stage 2 (mild): Secondary | ICD-10-CM

## 2023-06-20 NOTE — Progress Notes (Signed)
 Established Patient Office Visit  Subjective:  Patient ID: Briana [name], female    DOB: Jun 21, 1965  Age: 58 y.o. MRN: [id number]  Chief Complaint  Patient presents with   Follow-up    4 Months Follow Up    Patient in office for 4 month follow up, did not have fasting lab work done. Patient doing well, no complaints today.  Patient thinks she may be scheduled for a colonoscopy in July 2025.  Fasting today, will get lab work.  Continue same medications.     No other concerns at this time.   Past Medical History:  Diagnosis Date   (HFpEF) heart failure with preserved ejection fraction (HCC)    a.) TTE 11/08/2017: EF 50%, no RWMAs, norm RVSF, RVSP 22.8, triv MR, mild TR; b.) TEE 02/22/2021:; EF 60-65%, no LAA thrombus, ? PFO with (+) IAS within 3-6 cardiac cycles   Allergy    Anxiety    Aortic atherosclerosis (HCC)    Arthritis    Asthma    Bipolar disorder (HCC)    CAD (coronary artery disease) 12/21/2022   a.) LDCT 12/21/2022: age advanced  3 vessel CAD   Chronic pain syndrome    a.) on naloxone ; has naloxone  Rx available   CKD (chronic kidney disease), stage III (HCC)    COPD (chronic obstructive pulmonary disease) (HCC)    CVA (cerebral vascular accident) (HCC) 02/18/2021   a.) MRI brain 02/18/2021: tiny acute embolic infarct of the LEFT cerebellar hemisphere and LEFT cerebrum areas   CVA (cerebral vascular accident) (HCC) 02/20/2021   a.) MRI brain 02/20/2021: subcentimeter acute infarct RIGHT anterior cerebellar vermis; new since MRI 2 days prior (02/18/2021)   DDD (degenerative disc disease), cervical    DDD (degenerative disc disease), lumbar    Depression    Emphysema lung (HCC)    Fibromyalgia    Genetic testing 03/09/2017   Multi-Cancer panel (83 genes) @ [facility] - No pathogenic mutations detected   GERD (gastroesophageal reflux disease)    History of IBS    HLD (hyperlipidemia)    HTN (hypertension)    Hyperthyroidism    Implantable loop recorder present  01/2021   Insomnia    a.) taskes trazodone  PRN   Invasive carcinoma of LEFT breast 2019   a.) clinical stage Ia (ER/PR +, HER2/neu over-expressing) --> Tx'd with surgical resection (lumpectomy) + systemic chemotherapy + XRT + truncated aromotase inhibitor therapy   Lacunar infarction (HCC) 01/29/2019   a.) noted on MRI brain 01/29/2019: 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 (new since 2014 imaging)   Long term current use of opiate analgesic    a.) has naloxone  Rx available   Migraine    Nephrolithiasis    On chronic clopidogrel  therapy    OSA (obstructive sleep apnea)    a.) unable to tolerate mask required for nocturnal PAP therapy   Patent foramen ovale with atrial septal aneurysm 02/22/2021   a.) TEE 02/22/2021: bubble study with (+) IAS observed within 3-6 cardiac cycles   Personal history of chemotherapy    Personal history of radiation therapy    PTSD (post-traumatic stress disorder)    Restless leg syndrome    a.) on ropinirole     Past Surgical History:  Procedure Laterality Date   ABDOMINAL HYSTERECTOMY  2008   BREAST BIOPSY Left 02/02/2017   us  core positive   BREAST LUMPECTOMY Left 02/17/2017   BREAST LUMPECTOMY WITH NEEDLE LOCALIZATION Left 02/17/2017   Procedure: BREAST LUMPECTOMY WITH NEEDLE  LOCALIZATION;  Surgeon: Franki Isles, MD;  Location: [facility];  Service: General;  Laterality: Left;   CARPAL TUNNEL RELEASE Bilateral    cryotherapy     DILATION AND CURETTAGE OF UTERUS     ENDOMETRIAL ABLATION     EXTRACORPOREAL SHOCK WAVE LITHOTRIPSY Left 02/23/2023   Procedure: EXTRACORPOREAL SHOCK WAVE LITHOTRIPSY (ESWL);  Surgeon: Dustin Gimenez, MD;  Location: [facility];  Service: Urology;  Laterality: Left;   LAPAROSCOPIC OOPHERECTOMY Left    unsure which side but thinks its the left   LYSIS OF ADHESION Left 10/14/2015   Procedure: LYSIS OF ADHESION;  Surgeon: Rande Bushy, MD;  Location: [facility];  Service: Orthopedics;  Laterality: Left;    NECK SURGERY     lower neck fusion rods and screws   OOPHORECTOMY     one ovary removed    PORTA CATH INSERTION N/A 03/08/2017   Procedure: PORTA CATH INSERTION;  Surgeon: Jackquelyn Mass, MD;  Location: [facility];  Service: Cardiovascular;  Laterality: N/A;   RESECTION DISTAL CLAVICAL Left 10/14/2015   Procedure: RESECTION DISTAL CLAVICAL;  Surgeon: Rande Bushy, MD;  Location: [facility];  Service: Orthopedics;  Laterality: Left;   SENTINEL NODE BIOPSY Left 02/17/2017   Procedure: SENTINEL NODE BIOPSY;  Surgeon: Franki Isles, MD;  Location: [facility];  Service: General;  Laterality: Left;   SHOULDER ARTHROSCOPY WITH OPEN ROTATOR CUFF REPAIR Left 10/14/2015   Procedure: SHOULDER ARTHROSCOPY WITH OPEN ROTATOR CUFF REPAIR;  Surgeon: Rande Bushy, MD;  Location: [facility];  Service: Orthopedics;  Laterality: Left;   SHOULDER ARTHROSCOPY WITH OPEN ROTATOR CUFF REPAIR AND DISTAL CLAVICLE ACROMINECTOMY Right 09/03/2014   Procedure: RIGHT SHOULDER ARTHROSCOPY WITH MINI OPEN ROTATOR CUFF TEAR;  Surgeon: Rande Bushy, MD;  Location: [facility];  Service: Orthopedics;  Laterality: Right;  biceps tenodesis, arthroscopic subacromial decompression and distal clavicle incision   spinal injections     SUBACROMIAL DECOMPRESSION Left 10/14/2015   Procedure: SUBACROMIAL DECOMPRESSION;  Surgeon: Rande Bushy, MD;  Location: [facility];  Service: Orthopedics;  Laterality: Left;   TEE WITHOUT CARDIOVERSION N/A 02/22/2021   Procedure: TRANSESOPHAGEAL ECHOCARDIOGRAM (TEE);  Surgeon: Michelle Aid, MD;  Location: [facility];  Service: Cardiovascular;  Laterality: N/A;    Social History   Socioeconomic History   Marital status: Divorced    Spouse name: Not on file   Number of children: 1   Years of education: Not on file   Highest education level: High school graduate  Occupational History    Comment: disabled  Tobacco Use   Smoking status: Every Day    Current packs/day: 1.00    Average  packs/day: 1 pack/day for 51.4 years (51.4 ttl pk-yrs)    Types: Cigarettes    Start date: 1974   Smokeless tobacco: Never  Vaping Use   Vaping status: Never Used  Substance and Sexual Activity   Alcohol use: No    Alcohol/week: 0.0 standard drinks of alcohol   Drug use: No   Sexual activity: Yes  Other Topics Concern   Not on file  Social History Narrative   Lives alone. Best friend is her support person.    Social Drivers of Health   Financial Resource Strain: High Risk (04/26/2017)   Overall Financial Resource Strain (CARDIA)    Difficulty of Paying Living Expenses: Very hard  Food Insecurity: Food Insecurity Present (04/26/2017)   Hunger Vital Sign    Worried About Running Out of Food in the Last Year: Often true  Ran Out of Food in the Last Year: Often true  Transportation Needs: No Transportation Needs (04/26/2017)   PRAPARE - Administrator, Civil Service (Medical): No    Lack of Transportation (Non-Medical): No  Physical Activity: Inactive (04/26/2017)   Exercise Vital Sign    Days of Exercise per Week: 0 days    Minutes of Exercise per Session: 0 min  Stress: Stress Concern Present (04/26/2017)   [employer] of Occupational Health - Occupational Stress Questionnaire    Feeling of Stress : Very much  Social Connections: Unknown (04/26/2017)   Social Connection and Isolation Panel [NHANES]    Frequency of Communication with Friends and Family: Not on file    Frequency of Social Gatherings with Friends and Family: Not on file    Attends Religious Services: Never    Active Member of Clubs or Organizations: No    Attends Banker Meetings: Never    Marital Status: Divorced  Catering manager Violence: Not At Risk (04/26/2017)   Humiliation, Afraid, Rape, and Kick questionnaire    Fear of Current or Ex-Partner: No    Emotionally Abused: No    Physically Abused: No    Sexually Abused: No    Family History  Problem Relation Age of Onset    Breast cancer Mother 83       Glioblastoma also; deceased at 87   Diabetes Father    Lung cancer Father        mesothelioma; deceased 5s   Cancer Maternal Aunt        "bone ca"; unk. primary   Colon cancer Maternal Grandfather        dx in 63s; deceased 82   Colon cancer Maternal Aunt        dx in 26s; currently 20s   Lung cancer Maternal Grandmother        smoker; deceased 49   Lung cancer Paternal Grandmother        smoker; deceased 36s   Breast cancer Cousin        dx 51s; daughter of mat aunt with unk. primary cancer   Cancer Other        distant cousin; unknown primary   Colon cancer Other        dx 91s; currently 50; maternal half-sister   Bipolar disorder Sister    Schizophrenia Sister     Allergies  Allergen Reactions   Iodine    Bee Venom Hives and Rash   Cefoxitin Rash   Cefuroxime Axetil Hives and Rash   Cucumber Extract Rash   Gadolinium Derivatives Swelling, Rash and Other (See Comments)    NECK BECAME RED AND TONGUE WAS SWELLING SLIGHTLY PER PT   Iodinated Contrast Media Hives, Rash and Other (See Comments)   Latex Rash   Other Hives and Rash    Bee Stings-swelling/hives/rash Wool (textile fiber)-Rash/itching   Tomato Rash and Other (See Comments)    Red tomatoes    Outpatient Medications Prior to Visit  Medication Sig   albuterol  (VENTOLIN  HFA) 108 (90 Base) MCG/ACT inhaler Inhale 1-2 puffs into the lungs every 6 (six) hours as needed for wheezing or shortness of breath.   B Complex Vitamins (VITAMIN B COMPLEX) CAPS Take by mouth.   baclofen  (LIORESAL ) 10 MG tablet Take 10 mg by mouth 2 (two) times daily.   benzoyl peroxide -erythromycin  (BENZAMYCIN) gel Apply to affected area 2 times daily   Beta Carotene (VITAMIN A) 25000 UNIT capsule Take 25,000 Units by  mouth daily.   calcium  carbonate (OS-CAL) 600 MG TABS tablet Take by mouth.   cholecalciferol  (VITAMIN D ) 1000 UNITS tablet Take 1,000 Units by mouth 2 (two) times daily.    clopidogrel  (PLAVIX )  75 MG tablet Take 1 tablet (75 mg total) by mouth daily.   EPINEPHrine  0.3 mg/0.3 mL IJ SOAJ injection Inject 0.3 mg into the muscle as needed for anaphylaxis.   Multiple Vitamins-Minerals (HAIR SKIN AND NAILS FORMULA) TABS Take 2 tablets by mouth daily.   NARCAN  4 MG/0.1ML LIQD nasal spray kit    OXYCODONE  HCL PO Take 15 mg by mouth as needed. Take 4 times a day as needed.   pregabalin  (LYRICA ) 25 MG capsule Take 1 capsule by mouth every other day.   promethazine  (PHENERGAN ) 25 MG tablet Take 1 tablet (25 mg total) by mouth every 6 (six) hours as needed for nausea or vomiting.   rizatriptan (MAXALT-MLT) 10 MG disintegrating tablet Take 10 mg by mouth as needed for migraine. May repeat in 2 hours if needed   rOPINIRole  (REQUIP ) 1 MG tablet TAKE 1 TABLET BY MOUTH AT  BEDTIME   rosuvastatin  (CRESTOR ) 40 MG tablet TAKE 1 TABLET BY MOUTH AT  BEDTIME   spironolactone (ALDACTONE) 100 MG tablet Take 100 mg by mouth daily.   topiramate  (TOPAMAX ) 100 MG tablet Take 200 mg by mouth daily.    trazodone  (DESYREL ) 300 MG tablet Take 1 tablet (300 mg total) by mouth at bedtime.   valACYclovir  (VALTREX ) 1000 MG tablet Take 1,000 mg by mouth 3 (three) times daily.   vitamin E  200 UNIT capsule Take 200 Units by mouth daily.   No facility-administered medications prior to visit.    Review of Systems  Constitutional: Negative.   HENT: Negative.    Eyes: Negative.   Respiratory: Negative.  Negative for shortness of breath.   Cardiovascular: Negative.  Negative for chest pain.  Gastrointestinal: Negative.  Negative for abdominal pain, constipation and diarrhea.  Genitourinary: Negative.   Musculoskeletal:  Negative for joint pain and myalgias.  Skin: Negative.   Neurological: Negative.  Negative for dizziness and headaches.  Endo/Heme/Allergies: Negative.   All other systems reviewed and are negative.      Objective:   BP 106/60   Pulse (!) 114   Ht 5\' 3"  (1.6 m)   Wt 148 lb (67.1 kg)   LMP  08/24/2008   SpO2 98%   BMI 26.22 kg/m   Vitals:   06/20/23 1311  BP: 106/60  Pulse: (!) 114  Height: 5\' 3"  (1.6 m)  Weight: 148 lb (67.1 kg)  SpO2: 98%  BMI (Calculated): 26.22    Physical Exam Vitals and nursing note reviewed.  Constitutional:      Appearance: Normal appearance. She is normal weight.  HENT:     Head: Normocephalic and atraumatic.     Nose: Nose normal.     Mouth/Throat:     Mouth: Mucous membranes are moist.  Eyes:     Extraocular Movements: Extraocular movements intact.     Conjunctiva/sclera: Conjunctivae normal.     Pupils: Pupils are equal, round, and reactive to light.  Cardiovascular:     Rate and Rhythm: Normal rate and regular rhythm.     Pulses: Normal pulses.     Heart sounds: Normal heart sounds.  Pulmonary:     Effort: Pulmonary effort is normal.     Breath sounds: Normal breath sounds.  Abdominal:     General: Abdomen is flat. Bowel sounds are normal.  Palpations: Abdomen is soft.  Musculoskeletal:        General: Normal range of motion.     Cervical back: Normal range of motion.  Skin:    General: Skin is warm and dry.  Neurological:     General: No focal deficit present.     Mental Status: She is alert and oriented to person, place, and time.  Psychiatric:        Mood and Affect: Mood normal.        Behavior: Behavior normal.        Thought Content: Thought content normal.        Judgment: Judgment normal.      No results found for any visits on 06/20/23.  Recent Results (from the past 2160 hours)  Urinalysis, Complete     Status: Abnormal   Collection Time: 03/29/23  2:05 PM  Result Value Ref Range   Specific Gravity, UA >1.030 (H) 1.005 - 1.030   pH, UA 5.5 5.0 - 7.5   Color, UA Yellow Yellow   Appearance Ur Hazy (A) Clear   Leukocytes,UA Negative Negative   Protein,UA 1+ (A) Negative/Trace   Glucose, UA Negative Negative   Ketones, UA Trace (A) Negative   RBC, UA 3+ (A) Negative   Bilirubin, UA Negative  Negative   Urobilinogen, Ur 0.2 0.2 - 1.0 mg/dL   Nitrite, UA Negative Negative   Microscopic Examination See below:   Microscopic Examination     Status: Abnormal   Collection Time: 03/29/23  2:05 PM   Urine  Result Value Ref Range   WBC, UA 0-5 0 - 5 /hpf   RBC, Urine >30 (A) 0 - 2 /hpf   Epithelial Cells (non renal) 0-10 0 - 10 /hpf   Casts Present (A) None seen /lpf   Cast Type Hyaline casts N/A   Mucus, UA Present (A) Not Estab.   Bacteria, UA Moderate (A) None seen/Few  CULTURE, URINE COMPREHENSIVE     Status: None   Collection Time: 04/26/23 12:59 PM   Specimen: Urine   UR  Result Value Ref Range   Urine Culture, Comprehensive Final report    Organism ID, Bacteria Comment     Comment: Mixed urogenital flora 25,000-50,000 colony forming units per mL   Urinalysis, Complete     Status: Abnormal   Collection Time: 04/26/23 12:59 PM  Result Value Ref Range   Specific Gravity, UA 1.020 1.005 - 1.030   pH, UA 5.5 5.0 - 7.5   Color, UA Yellow Yellow   Appearance Ur Hazy (A) Clear   Leukocytes,UA Negative Negative   Protein,UA 1+ (A) Negative/Trace   Glucose, UA Negative Negative   Ketones, UA Trace (A) Negative   RBC, UA Negative Negative   Bilirubin, UA Negative Negative   Urobilinogen, Ur 0.2 0.2 - 1.0 mg/dL   Nitrite, UA Negative Negative   Microscopic Examination See below:   Microscopic Examination     Status: Abnormal   Collection Time: 04/26/23 12:59 PM   Urine  Result Value Ref Range   WBC, UA 6-10 (A) 0 - 5 /hpf   RBC, Urine 0-2 0 - 2 /hpf   Epithelial Cells (non renal) 0-10 0 - 10 /hpf   Casts Present (A) None seen /lpf   Cast Type Hyaline casts N/A    Comment: Granular casts   Bacteria, UA Few None seen/Few      Assessment & Plan:  Fasting lab work today Continue same medications  Problem List  Items Addressed This Visit       Genitourinary   Chronic kidney disease, stage II (mild)   Relevant Orders   CMP14+EGFR     Other   HLD  (hyperlipidemia)   Relevant Orders   Lipid Profile   Prediabetes - Primary   Relevant Orders   Hemoglobin A1c   Other Visit Diagnoses       Thyroid  disorder screening       Relevant Orders   TSH       Return in about 4 months (around 10/21/2023) for with fasting labs prior.   Total time spent: 25 minutes  [employer], NP  06/20/2023   This document may have been prepared by Dragon Voice Recognition software and as such may include unintentional dictation errors.

## 2023-06-21 ENCOUNTER — Ambulatory Visit: Payer: Self-pay | Admitting: Cardiology

## 2023-06-21 LAB — LIPID PANEL
Chol/HDL Ratio: 2.8 ratio (ref 0.0–4.4)
Cholesterol, Total: 119 mg/dL (ref 100–199)
HDL: 43 mg/dL (ref >39)
LDL Chol Calc (NIH): 46 mg/dL (ref 0–99)
Triglycerides: 186 mg/dL — ABNORMAL HIGH (ref 0–149)
VLDL Cholesterol Cal: 30 mg/dL (ref 5–40)

## 2023-06-21 LAB — CMP14+EGFR
ALT: 9 IU/L (ref 0–32)
AST: 13 IU/L (ref 0–40)
Albumin: 4.3 g/dL (ref 3.8–4.9)
Alkaline Phosphatase: 90 IU/L (ref 44–121)
BUN/Creatinine Ratio: 18 (ref 9–23)
BUN: 24 mg/dL (ref 6–24)
Bilirubin Total: <0.2 mg/dL (ref 0.0–1.2)
CO2: 20 mmol/L (ref 20–29)
Calcium: 9.7 mg/dL (ref 8.7–10.2)
Chloride: 101 mmol/L (ref 96–106)
Creatinine, Ser: 1.36 mg/dL — ABNORMAL HIGH (ref 0.57–1.00)
Globulin, Total: 2.7 g/dL (ref 1.5–4.5)
Glucose: 85 mg/dL (ref 70–99)
Potassium: 4.4 mmol/L (ref 3.5–5.2)
Sodium: 137 mmol/L (ref 134–144)
Total Protein: 7 g/dL (ref 6.0–8.5)
eGFR: 45 mL/min/{1.73_m2} — ABNORMAL LOW (ref >59)

## 2023-06-21 LAB — HEMOGLOBIN A1C
Est. average glucose Bld gHb Est-mCnc: 117 mg/dL
Hgb A1c MFr Bld: 5.7 % — ABNORMAL HIGH (ref 4.8–5.6)

## 2023-06-21 LAB — TSH: TSH: 2.61 u[IU]/mL (ref 0.450–4.500)

## 2023-06-28 DIAGNOSIS — M19019 Primary osteoarthritis, unspecified shoulder: Secondary | ICD-10-CM | POA: Diagnosis not present

## 2023-06-28 DIAGNOSIS — G894 Chronic pain syndrome: Secondary | ICD-10-CM | POA: Diagnosis not present

## 2023-06-28 DIAGNOSIS — Z79891 Long term (current) use of opiate analgesic: Secondary | ICD-10-CM | POA: Diagnosis not present

## 2023-07-03 DIAGNOSIS — J449 Chronic obstructive pulmonary disease, unspecified: Secondary | ICD-10-CM | POA: Diagnosis not present

## 2023-07-04 ENCOUNTER — Other Ambulatory Visit: Payer: Self-pay | Admitting: Urology

## 2023-07-04 NOTE — Telephone Encounter (Signed)
 Should patient still be on this medication now?

## 2023-07-25 DIAGNOSIS — Z860101 Personal history of adenomatous and serrated colon polyps: Secondary | ICD-10-CM | POA: Diagnosis not present

## 2023-07-25 DIAGNOSIS — K5790 Diverticulosis of intestine, part unspecified, without perforation or abscess without bleeding: Secondary | ICD-10-CM | POA: Diagnosis not present

## 2023-07-25 DIAGNOSIS — K5903 Drug induced constipation: Secondary | ICD-10-CM | POA: Diagnosis not present

## 2023-07-25 DIAGNOSIS — Z7902 Long term (current) use of antithrombotics/antiplatelets: Secondary | ICD-10-CM | POA: Diagnosis not present

## 2023-07-30 ENCOUNTER — Other Ambulatory Visit: Payer: Self-pay | Admitting: Cardiology

## 2023-07-31 DIAGNOSIS — Z79891 Long term (current) use of opiate analgesic: Secondary | ICD-10-CM | POA: Diagnosis not present

## 2023-07-31 DIAGNOSIS — M19019 Primary osteoarthritis, unspecified shoulder: Secondary | ICD-10-CM | POA: Diagnosis not present

## 2023-07-31 DIAGNOSIS — G894 Chronic pain syndrome: Secondary | ICD-10-CM | POA: Diagnosis not present

## 2023-08-02 DIAGNOSIS — J449 Chronic obstructive pulmonary disease, unspecified: Secondary | ICD-10-CM | POA: Diagnosis not present

## 2023-08-05 ENCOUNTER — Other Ambulatory Visit: Payer: Self-pay | Admitting: Cardiology

## 2023-08-05 DIAGNOSIS — G4700 Insomnia, unspecified: Secondary | ICD-10-CM

## 2023-08-07 ENCOUNTER — Other Ambulatory Visit: Payer: Self-pay | Admitting: Cardiology

## 2023-08-07 ENCOUNTER — Encounter: Payer: Self-pay | Admitting: Cardiology

## 2023-08-07 MED ORDER — ROPINIROLE HCL 2 MG PO TABS: 2.0000 mg | ORAL_TABLET | Freq: Every day | ORAL | 0 refills | Status: DC

## 2023-08-14 DIAGNOSIS — I5032 Chronic diastolic (congestive) heart failure: Secondary | ICD-10-CM | POA: Diagnosis not present

## 2023-08-14 DIAGNOSIS — N182 Chronic kidney disease, stage 2 (mild): Secondary | ICD-10-CM | POA: Diagnosis not present

## 2023-08-14 DIAGNOSIS — Z95818 Presence of other cardiac implants and grafts: Secondary | ICD-10-CM | POA: Diagnosis not present

## 2023-08-14 DIAGNOSIS — G4730 Sleep apnea, unspecified: Secondary | ICD-10-CM | POA: Diagnosis not present

## 2023-08-14 DIAGNOSIS — I10 Essential (primary) hypertension: Secondary | ICD-10-CM | POA: Diagnosis not present

## 2023-08-14 DIAGNOSIS — J449 Chronic obstructive pulmonary disease, unspecified: Secondary | ICD-10-CM | POA: Diagnosis not present

## 2023-08-14 DIAGNOSIS — E782 Mixed hyperlipidemia: Secondary | ICD-10-CM | POA: Diagnosis not present

## 2023-08-14 DIAGNOSIS — Z720 Tobacco use: Secondary | ICD-10-CM | POA: Diagnosis not present

## 2023-08-14 DIAGNOSIS — I63119 Cerebral infarction due to embolism of unspecified vertebral artery: Secondary | ICD-10-CM | POA: Diagnosis not present

## 2023-08-23 ENCOUNTER — Encounter: Payer: Self-pay | Admitting: Acute Care

## 2023-08-28 DIAGNOSIS — M19019 Primary osteoarthritis, unspecified shoulder: Secondary | ICD-10-CM | POA: Diagnosis not present

## 2023-08-28 DIAGNOSIS — G894 Chronic pain syndrome: Secondary | ICD-10-CM | POA: Diagnosis not present

## 2023-08-29 ENCOUNTER — Other Ambulatory Visit: Payer: Self-pay | Admitting: Cardiology

## 2023-08-29 ENCOUNTER — Encounter: Payer: Self-pay | Admitting: Cardiology

## 2023-08-29 DIAGNOSIS — G894 Chronic pain syndrome: Secondary | ICD-10-CM

## 2023-08-29 DIAGNOSIS — M51369 Other intervertebral disc degeneration, lumbar region without mention of lumbar back pain or lower extremity pain: Secondary | ICD-10-CM

## 2023-08-29 DIAGNOSIS — M5134 Other intervertebral disc degeneration, thoracic region: Secondary | ICD-10-CM

## 2023-08-29 DIAGNOSIS — M47812 Spondylosis without myelopathy or radiculopathy, cervical region: Secondary | ICD-10-CM

## 2023-09-02 DIAGNOSIS — J449 Chronic obstructive pulmonary disease, unspecified: Secondary | ICD-10-CM | POA: Diagnosis not present

## 2023-09-04 ENCOUNTER — Ambulatory Visit: Admitting: Certified Registered"

## 2023-09-04 ENCOUNTER — Encounter: Payer: Self-pay | Admitting: Gastroenterology

## 2023-09-04 ENCOUNTER — Ambulatory Visit
Admission: RE | Admit: 2023-09-04 | Discharge: 2023-09-04 | Disposition: A | Discharge status: Home / Self Care | Attending: Gastroenterology | Admitting: Gastroenterology

## 2023-09-04 ENCOUNTER — Encounter
Admission: RE | Disposition: A | Discharge status: Home / Self Care | Payer: Self-pay | Source: Home / Self Care | Attending: Gastroenterology

## 2023-09-04 ENCOUNTER — Other Ambulatory Visit: Payer: Self-pay

## 2023-09-04 DIAGNOSIS — Z860101 Personal history of adenomatous and serrated colon polyps: Secondary | ICD-10-CM | POA: Diagnosis not present

## 2023-09-04 DIAGNOSIS — E0590 Thyrotoxicosis, unspecified without thyrotoxic crisis or storm: Secondary | ICD-10-CM | POA: Diagnosis not present

## 2023-09-04 DIAGNOSIS — I2510 Atherosclerotic heart disease of native coronary artery without angina pectoris: Secondary | ICD-10-CM | POA: Insufficient documentation

## 2023-09-04 DIAGNOSIS — R519 Headache, unspecified: Secondary | ICD-10-CM | POA: Insufficient documentation

## 2023-09-04 DIAGNOSIS — M797 Fibromyalgia: Secondary | ICD-10-CM | POA: Insufficient documentation

## 2023-09-04 DIAGNOSIS — Z7902 Long term (current) use of antithrombotics/antiplatelets: Secondary | ICD-10-CM | POA: Insufficient documentation

## 2023-09-04 DIAGNOSIS — G4733 Obstructive sleep apnea (adult) (pediatric): Secondary | ICD-10-CM | POA: Diagnosis not present

## 2023-09-04 DIAGNOSIS — K219 Gastro-esophageal reflux disease without esophagitis: Secondary | ICD-10-CM | POA: Insufficient documentation

## 2023-09-04 DIAGNOSIS — Z79891 Long term (current) use of opiate analgesic: Secondary | ICD-10-CM | POA: Diagnosis not present

## 2023-09-04 DIAGNOSIS — F319 Bipolar disorder, unspecified: Secondary | ICD-10-CM | POA: Insufficient documentation

## 2023-09-04 DIAGNOSIS — N183 Chronic kidney disease, stage 3 unspecified: Secondary | ICD-10-CM | POA: Diagnosis not present

## 2023-09-04 DIAGNOSIS — J439 Emphysema, unspecified: Secondary | ICD-10-CM | POA: Diagnosis not present

## 2023-09-04 DIAGNOSIS — I129 Hypertensive chronic kidney disease with stage 1 through stage 4 chronic kidney disease, or unspecified chronic kidney disease: Secondary | ICD-10-CM | POA: Diagnosis not present

## 2023-09-04 DIAGNOSIS — Z1211 Encounter for screening for malignant neoplasm of colon: Secondary | ICD-10-CM | POA: Insufficient documentation

## 2023-09-04 DIAGNOSIS — D122 Benign neoplasm of ascending colon: Secondary | ICD-10-CM | POA: Diagnosis not present

## 2023-09-04 DIAGNOSIS — K5730 Diverticulosis of large intestine without perforation or abscess without bleeding: Secondary | ICD-10-CM | POA: Insufficient documentation

## 2023-09-04 DIAGNOSIS — F17210 Nicotine dependence, cigarettes, uncomplicated: Secondary | ICD-10-CM | POA: Insufficient documentation

## 2023-09-04 DIAGNOSIS — F419 Anxiety disorder, unspecified: Secondary | ICD-10-CM | POA: Insufficient documentation

## 2023-09-04 DIAGNOSIS — Z860100 Personal history of colon polyps, unspecified: Secondary | ICD-10-CM | POA: Diagnosis not present

## 2023-09-04 DIAGNOSIS — I5030 Unspecified diastolic (congestive) heart failure: Secondary | ICD-10-CM | POA: Diagnosis not present

## 2023-09-04 DIAGNOSIS — G894 Chronic pain syndrome: Secondary | ICD-10-CM | POA: Insufficient documentation

## 2023-09-04 DIAGNOSIS — K635 Polyp of colon: Secondary | ICD-10-CM | POA: Diagnosis not present

## 2023-09-04 DIAGNOSIS — I130 Hypertensive heart and chronic kidney disease with heart failure and stage 1 through stage 4 chronic kidney disease, or unspecified chronic kidney disease: Secondary | ICD-10-CM | POA: Diagnosis not present

## 2023-09-04 HISTORY — PX: COLONOSCOPY: SHX5424

## 2023-09-04 SURGERY — COLONOSCOPY
Anesthesia: General

## 2023-09-04 MED ORDER — SODIUM CHLORIDE 0.9 % IV SOLN: INTRAVENOUS | Status: DC

## 2023-09-04 MED ORDER — LIDOCAINE HCL (CARDIAC) PF 100 MG/5ML IV SOSY
PREFILLED_SYRINGE | INTRAVENOUS | Status: DC | PRN
  Administered 2023-09-04: 50 mg via INTRAVENOUS

## 2023-09-04 MED ORDER — PROPOFOL 10 MG/ML IV BOLUS
INTRAVENOUS | Status: DC | PRN
  Administered 2023-09-04 (×2): 40 mg via INTRAVENOUS
  Administered 2023-09-04: 110 mg via INTRAVENOUS
  Administered 2023-09-04: 40 mg via INTRAVENOUS
  Administered 2023-09-04: 50 mg via INTRAVENOUS

## 2023-09-04 NOTE — Op Note (Signed)
 [facility] Gastroenterology Patient Name: Briana [name] Procedure Date: 09/04/2023 8:36 AM MRN: [id number] Account #: [id number] Date of Birth: [birth year] Admit Type: Outpatient Age: 58 Room: [facility] ENDO ROOM 1 Gender: Female Note Status: Finalized Instrument Name: [id number] Procedure:             Colonoscopy Indications:           Surveillance: Personal history of adenomatous polyps                         on last colonoscopy > 3 years ago Providers:             Ruel Kung MD, MD Referring MD:          No Local Md, MD (Referring MD) Medicines:             Monitored Anesthesia Care Complications:         No immediate complications. Procedure:             Pre-Anesthesia Assessment:                        - Prior to the procedure, a History and Physical was                         performed, and patient medications, allergies and                         sensitivities were reviewed. The patient's tolerance                         of previous anesthesia was reviewed.                        - The risks and benefits of the procedure and the                         sedation options and risks were discussed with the                         patient. All questions were answered and informed                         consent was obtained.                        After obtaining informed consent, the colonoscope was                         passed under direct vision. Throughout the procedure,                         the patient's blood pressure, pulse, and oxygen                          saturations were monitored continuously. The                         Colonoscope was introduced through the anus and  advanced to the the cecum, identified by the                         appendiceal orifice. The colonoscopy was performed                         with ease. The patient tolerated the procedure well.                         The quality of the bowel  preparation was adequate. The                         ileocecal valve, appendiceal orifice, and rectum were                         photographed. Findings:      The perianal and digital rectal examinations were normal.      Two sessile polyps were found in the ascending colon. The polyps were 4       to 6 mm in size. These polyps were removed with a cold snare. Resection       and retrieval were complete.      Multiple medium-mouthed diverticula were found in the sigmoid colon.      The exam was otherwise without abnormality on direct and retroflexion       views. Impression:            - Two 4 to 6 mm polyps in the ascending colon, removed                         with a cold snare. Resected and retrieved.                        - Diverticulosis in the sigmoid colon.                        - The examination was otherwise normal on direct and                         retroflexion views. Recommendation:        - Discharge patient to home (with escort).                        - Resume previous diet.                        - Continue present medications.                        - Await pathology results.                        - Repeat colonoscopy in 5 years for surveillance based                         on pathology results. Procedure Code(s):     --- Professional ---                        [phone], Colonoscopy, flexible; with removal of  tumor(s), polyp(s), or other lesion(s) by snare                         technique Diagnosis Code(s):     --- Professional ---                        Z86.010, Personal history of colonic polyps                        D12.2, Benign neoplasm of ascending colon                        K57.30, Diverticulosis of large intestine without                         perforation or abscess without bleeding CPT copyright 2022 American Medical Association. All rights reserved. The codes documented in this report are preliminary and upon coder review  may  be revised to meet current compliance requirements. Ruel Kung, MD Ruel Kung MD, MD 09/04/2023 9:08:25 AM This report has been signed electronically. Number of Addenda: 0 Note Initiated On: 09/04/2023 8:36 AM Scope Withdrawal Time: 0 hours 10 minutes 33 seconds  Total Procedure Duration: 0 hours 14 minutes 56 seconds  Estimated Blood Loss:  Estimated blood loss: none.      [facility]

## 2023-09-04 NOTE — Anesthesia Postprocedure Evaluation (Signed)
 Anesthesia Post Note  Patient: Briana [name]  Procedure(s) Performed: COLONOSCOPY  Patient location during evaluation: PACU Anesthesia Type: General Level of consciousness: awake and awake and alert Pain management: satisfactory to patient Vital Signs Assessment: post-procedure vital signs reviewed and stable Cardiovascular status: blood pressure returned to baseline Anesthetic complications: no   No notable events documented.   Last Vitals:  Vitals:   09/04/23 0905 09/04/23 0919  BP: 103/61 120/76  Pulse: 79 82  Resp: 17 14  Temp: (!) 36.2 C (!) 36.2 C  SpO2: 96% 100%    Last Pain:  Vitals:   09/04/23 0919  TempSrc: Temporal  PainSc: 0-No pain                 VAN STAVEREN,[name]

## 2023-09-04 NOTE — Transfer of Care (Signed)
 Immediate Anesthesia Transfer of Care Note  Patient: Briana [name]  Procedure(s) Performed: COLONOSCOPY  Patient Location: Endoscopy Unit  Anesthesia Type:General  Level of Consciousness: drowsy  Airway & Oxygen  Therapy: Patient Spontanous Breathing  Post-op Assessment: Report given to RN, Post -op Vital signs reviewed and stable, and Patient moving all extremities  Post vital signs: Reviewed and stable  Last Vitals:  Vitals Value Taken Time  BP 84/49 09/04/23 09:08  Temp    Pulse 91 09/04/23 09:09  Resp 13 09/04/23 09:09  SpO2 96 % 09/04/23 09:09  Vitals shown include unfiled device data.  Last Pain:  Vitals:   09/04/23 0827  TempSrc: Temporal  PainSc: 4          Complications: No notable events documented.

## 2023-09-04 NOTE — Anesthesia Preprocedure Evaluation (Signed)
 Anesthesia Evaluation  Patient identified by MRN, date of birth, ID band Patient awake    Reviewed: Allergy & Precautions, NPO status , Patient's Chart, lab work & pertinent test results  Airway Mallampati: II  TM Distance: >3 FB Neck ROM: Full    Dental  (+) Teeth Intact   Pulmonary neg pulmonary ROS, sleep apnea , COPD,  COPD inhaler, Current Smoker   Pulmonary exam normal  + decreased breath sounds      Cardiovascular hypertension, Pt. on medications + CAD  negative cardio ROS Normal cardiovascular exam Rhythm:Regular Rate:Normal  Hx of PFO   Neuro/Psych  Headaches  Anxiety  Bipolar Disorder   CVA, No Residual Symptoms negative neurological ROS  negative psych ROS   GI/Hepatic negative GI ROS, Neg liver ROS,GERD  Medicated,,  Endo/Other  negative endocrine ROS Hyperthyroidism   Renal/GU negative Renal ROS  negative genitourinary   Musculoskeletal   Abdominal Normal abdominal exam  (+)   Peds negative pediatric ROS (+)  Hematology negative hematology ROS (+)   Anesthesia Other Findings Past Medical History: No date: (HFpEF) heart failure with preserved ejection fraction (HCC)     Comment:  a.) TTE 11/08/2017: EF 50%, no RWMAs, norm RVSF, RVSP               22.8, triv MR, mild TR; b.) TEE 02/22/2021:; EF 60-65%,               no LAA thrombus, ? PFO with (+) IAS within 3-6 cardiac               cycles No date: Allergy No date: Anxiety No date: Aortic atherosclerosis (HCC) No date: Arthritis No date: Asthma No date: Bipolar disorder (HCC) 12/21/2022: CAD (coronary artery disease)     Comment:  a.) LDCT 12/21/2022: age advanced  3 vessel CAD No date: Chronic pain syndrome     Comment:  a.) on naloxone ; has naloxone  Rx available No date: CKD (chronic kidney disease), stage III (HCC) No date: COPD (chronic obstructive pulmonary disease) (HCC) 02/18/2021: CVA (cerebral vascular accident) (HCC)     Comment:   a.) MRI brain 02/18/2021: tiny acute embolic infarct of               the LEFT cerebellar hemisphere and LEFT cerebrum areas 02/20/2021: CVA (cerebral vascular accident) [facility])     Comment:  a.) MRI brain 02/20/2021: subcentimeter acute infarct               RIGHT anterior cerebellar vermis; new since MRI 2 days               prior (02/18/2021) No date: DDD (degenerative disc disease), cervical No date: DDD (degenerative disc disease), lumbar No date: Depression No date: Emphysema lung (HCC) No date: Fibromyalgia 03/09/2017: Genetic testing     Comment:  Multi-Cancer panel (83 genes) @ [facility] - No pathogenic               mutations detected No date: GERD (gastroesophageal reflux disease) No date: History of IBS No date: HLD (hyperlipidemia) No date: HTN (hypertension) No date: Hyperthyroidism 01/2021: Implantable loop recorder present No date: Insomnia     Comment:  a.) taskes trazodone  PRN 2019: Invasive carcinoma of LEFT breast     Comment:  a.) clinical stage Ia (ER/PR +, HER2/neu               over-expressing) --> Tx'd with surgical resection               (  lumpectomy) + systemic chemotherapy + XRT + truncated               aromotase inhibitor therapy 01/29/2019: Lacunar infarction [facility])     Comment:  a.) noted on MRI brain 01/29/2019: 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 (new since 2014              imaging) No date: Long term current use of opiate analgesic     Comment:  a.) has naloxone  Rx available No date: Migraine No date: Nephrolithiasis No date: On chronic clopidogrel  therapy No date: OSA (obstructive sleep apnea)     Comment:  a.) unable to tolerate mask required for nocturnal PAP               therapy 02/22/2021: Patent foramen ovale with atrial septal aneurysm     Comment:  a.) TEE 02/22/2021: bubble study with (+) IAS observed               within 3-6 cardiac cycles No date: Personal history of chemotherapy No date:  Personal history of radiation therapy No date: PTSD (post-traumatic stress disorder) No date: Restless leg syndrome     Comment:  a.) on ropinirole   Past Surgical History: 2008: ABDOMINAL HYSTERECTOMY 02/02/2017: BREAST BIOPSY; Left     Comment:  us  core positive 02/17/2017: BREAST LUMPECTOMY; Left 02/17/2017: BREAST LUMPECTOMY WITH NEEDLE LOCALIZATION; Left     Comment:  Procedure: BREAST LUMPECTOMY WITH NEEDLE LOCALIZATION;                Surgeon: Nicholaus Selinda Birmingham, MD;  Location: [facility] ORS;                Service: General;  Laterality: Left; No date: CARPAL TUNNEL RELEASE; Bilateral No date: cryotherapy No date: DILATION AND CURETTAGE OF UTERUS No date: ENDOMETRIAL ABLATION 02/23/2023: EXTRACORPOREAL SHOCK WAVE LITHOTRIPSY; Left     Comment:  Procedure: EXTRACORPOREAL SHOCK WAVE LITHOTRIPSY (ESWL);              Surgeon: Penne Knee, MD;  Location: [facility] OR[facility];                Service: Urology;  Laterality: Left; No date: LAPAROSCOPIC OOPHERECTOMY; Left     Comment:  unsure which side but thinks its the left 10/14/2015: LYSIS OF ADHESION; Left     Comment:  Procedure: LYSIS OF ADHESION;  Surgeon: Franky Cranker,              MD;  Location: [facility];  Service: Orthopedics;                Laterality: Left; No date: NECK SURGERY     Comment:  lower neck fusion rods and screws No date: OOPHORECTOMY     Comment:  one ovary removed  03/08/2017: PORTA CATH INSERTION; N/A     Comment:  Procedure: PORTA CATH INSERTION;  Surgeon: Jama Cordella MATSU, MD;  Location: [facility];  Service:              Cardiovascular;  Laterality: N/A; 10/14/2015: RESECTION DISTAL CLAVICAL; Left     Comment:  Procedure: RESECTION DISTAL CLAVICAL;  Surgeon: Franky Cranker, MD;  Location: [facility];  Service:  Orthopedics;  Laterality: Left; 02/17/2017: SENTINEL NODE BIOPSY; Left     Comment:  Procedure: SENTINEL NODE BIOPSY;  Surgeon: Nicholaus Selinda Birmingham, MD;  Location: [facility];  Service: General;                Laterality: Left; 10/14/2015: SHOULDER ARTHROSCOPY WITH OPEN ROTATOR CUFF REPAIR; Left     Comment:  Procedure: SHOULDER ARTHROSCOPY WITH OPEN ROTATOR CUFF               REPAIR;  Surgeon: Franky Cranker, MD;  Location: [facility]               ORS;  Service: Orthopedics;  Laterality: Left; 09/03/2014: SHOULDER ARTHROSCOPY WITH OPEN ROTATOR CUFF REPAIR AND  DISTAL CLAVICLE ACROMINECTOMY; Right     Comment:  Procedure: RIGHT SHOULDER ARTHROSCOPY WITH MINI OPEN               ROTATOR CUFF TEAR;  Surgeon: Franky Cranker, MD;                Location: [facility];  Service: Orthopedics;  Laterality:               Right;  biceps tenodesis, arthroscopic subacromial               decompression and distal clavicle incision No date: spinal injections 10/14/2015: SUBACROMIAL DECOMPRESSION; Left     Comment:  Procedure: SUBACROMIAL DECOMPRESSION;  Surgeon: Franky Cranker, MD;  Location: [facility];  Service:               Orthopedics;  Laterality: Left; 02/22/2021: TEE WITHOUT CARDIOVERSION; N/A     Comment:  Procedure: TRANSESOPHAGEAL ECHOCARDIOGRAM (TEE);                Surgeon: Hester Wolm PARAS, MD;  Location: [facility];                Service: Cardiovascular;  Laterality: N/A;  BMI    Body Mass Index: 27.10 kg/m      Reproductive/Obstetrics negative OB ROS                              Anesthesia Physical Anesthesia Plan  ASA: 3  Anesthesia Plan: General   Post-op Pain Management:    Induction: Intravenous  PONV Risk Score and Plan: Propofol  infusion and TIVA  Airway Management Planned: Natural Airway and Nasal Cannula  Additional Equipment:   Intra-op Plan:   Post-operative Plan:   Informed Consent: I have reviewed the patients History and Physical, chart, labs and discussed the procedure including the risks, benefits and alternatives for the proposed anesthesia with the patient or  authorized representative who has indicated his/her understanding and acceptance.     Dental Advisory Given  Plan Discussed with: CRNA  Anesthesia Plan Comments:         Anesthesia Quick Evaluation

## 2023-09-04 NOTE — H&P (Signed)
 Ruel Kung , MD [address], [address], [place], [place], [address] Phone: [phone] Fax: [phone]  Primary Care Physician:  Carin Gauze, NP   Pre-Procedure History & Physical: HPI:  Briana [name] is a 58 y.o. female is here for an colonoscopy.   Past Medical History:  Diagnosis Date   (HFpEF) heart failure with preserved ejection fraction (HCC)    a.) TTE 11/08/2017: EF 50%, no RWMAs, norm RVSF, RVSP 22.8, triv MR, mild TR; b.) TEE 02/22/2021:; EF 60-65%, no LAA thrombus, ? PFO with (+) IAS within 3-6 cardiac cycles   Allergy    Anxiety    Aortic atherosclerosis (HCC)    Arthritis    Asthma    Bipolar disorder (HCC)    CAD (coronary artery disease) 12/21/2022   a.) LDCT 12/21/2022: age advanced  3 vessel CAD   Chronic pain syndrome    a.) on naloxone ; has naloxone  Rx available   CKD (chronic kidney disease), stage III (HCC)    COPD (chronic obstructive pulmonary disease) (HCC)    CVA (cerebral vascular accident) (HCC) 02/18/2021   a.) MRI brain 02/18/2021: tiny acute embolic infarct of the LEFT cerebellar hemisphere and LEFT cerebrum areas   CVA (cerebral vascular accident) (HCC) 02/20/2021   a.) MRI brain 02/20/2021: subcentimeter acute infarct RIGHT anterior cerebellar vermis; new since MRI 2 days prior (02/18/2021)   DDD (degenerative disc disease), cervical    DDD (degenerative disc disease), lumbar    Depression    Emphysema lung (HCC)    Fibromyalgia    Genetic testing 03/09/2017   Multi-Cancer panel (83 genes) @ [facility] - No pathogenic mutations detected   GERD (gastroesophageal reflux disease)    History of IBS    HLD (hyperlipidemia)    HTN (hypertension)    Hyperthyroidism    Implantable loop recorder present 01/2021   Insomnia    a.) taskes trazodone  PRN   Invasive carcinoma of LEFT breast 2019   a.) clinical stage Ia (ER/PR +, HER2/neu over-expressing) --> Tx'd with surgical resection (lumpectomy) + systemic chemotherapy + XRT + truncated  aromotase inhibitor therapy   Lacunar infarction (HCC) 01/29/2019   a.) noted on MRI brain 01/29/2019: 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 (new since 2014 imaging)   Long term current use of opiate analgesic    a.) has naloxone  Rx available   Migraine    Nephrolithiasis    On chronic clopidogrel  therapy    OSA (obstructive sleep apnea)    a.) unable to tolerate mask required for nocturnal PAP therapy   Patent foramen ovale with atrial septal aneurysm 02/22/2021   a.) TEE 02/22/2021: bubble study with (+) IAS observed within 3-6 cardiac cycles   Personal history of chemotherapy    Personal history of radiation therapy    PTSD (post-traumatic stress disorder)    Restless leg syndrome    a.) on ropinirole     Past Surgical History:  Procedure Laterality Date   ABDOMINAL HYSTERECTOMY  2008   BREAST BIOPSY Left 02/02/2017   us  core positive   BREAST LUMPECTOMY Left 02/17/2017   BREAST LUMPECTOMY WITH NEEDLE LOCALIZATION Left 02/17/2017   Procedure: BREAST LUMPECTOMY WITH NEEDLE LOCALIZATION;  Surgeon: Nicholaus Selinda Birmingham, MD;  Location: [facility];  Service: General;  Laterality: Left;   CARPAL TUNNEL RELEASE Bilateral    cryotherapy     DILATION AND CURETTAGE OF UTERUS     ENDOMETRIAL ABLATION     EXTRACORPOREAL SHOCK WAVE LITHOTRIPSY Left 02/23/2023  Procedure: EXTRACORPOREAL SHOCK WAVE LITHOTRIPSY (ESWL);  Surgeon: Penne Knee, MD;  Location: [facility];  Service: Urology;  Laterality: Left;   LAPAROSCOPIC OOPHERECTOMY Left    unsure which side but thinks its the left   LYSIS OF ADHESION Left 10/14/2015   Procedure: LYSIS OF ADHESION;  Surgeon: Franky Cranker, MD;  Location: [facility];  Service: Orthopedics;  Laterality: Left;   NECK SURGERY     lower neck fusion rods and screws   OOPHORECTOMY     one ovary removed    PORTA CATH INSERTION N/A 03/08/2017   Procedure: PORTA CATH INSERTION;  Surgeon: Jama Cordella MATSU, MD;  Location: [facility];   Service: Cardiovascular;  Laterality: N/A;   RESECTION DISTAL CLAVICAL Left 10/14/2015   Procedure: RESECTION DISTAL CLAVICAL;  Surgeon: Franky Cranker, MD;  Location: [facility];  Service: Orthopedics;  Laterality: Left;   SENTINEL NODE BIOPSY Left 02/17/2017   Procedure: SENTINEL NODE BIOPSY;  Surgeon: Nicholaus Selinda Birmingham, MD;  Location: [facility];  Service: General;  Laterality: Left;   SHOULDER ARTHROSCOPY WITH OPEN ROTATOR CUFF REPAIR Left 10/14/2015   Procedure: SHOULDER ARTHROSCOPY WITH OPEN ROTATOR CUFF REPAIR;  Surgeon: Franky Cranker, MD;  Location: [facility];  Service: Orthopedics;  Laterality: Left;   SHOULDER ARTHROSCOPY WITH OPEN ROTATOR CUFF REPAIR AND DISTAL CLAVICLE ACROMINECTOMY Right 09/03/2014   Procedure: RIGHT SHOULDER ARTHROSCOPY WITH MINI OPEN ROTATOR CUFF TEAR;  Surgeon: Franky Cranker, MD;  Location: [facility];  Service: Orthopedics;  Laterality: Right;  biceps tenodesis, arthroscopic subacromial decompression and distal clavicle incision   spinal injections     SUBACROMIAL DECOMPRESSION Left 10/14/2015   Procedure: SUBACROMIAL DECOMPRESSION;  Surgeon: Franky Cranker, MD;  Location: [facility];  Service: Orthopedics;  Laterality: Left;   TEE WITHOUT CARDIOVERSION N/A 02/22/2021   Procedure: TRANSESOPHAGEAL ECHOCARDIOGRAM (TEE);  Surgeon: Hester Wolm PARAS, MD;  Location: [facility];  Service: Cardiovascular;  Laterality: N/A;    Prior to Admission medications   Medication Sig Start Date End Date Taking? Authorizing Provider  albuterol  (VENTOLIN  HFA) 108 (90 Base) MCG/ACT inhaler USE 1 TO 2 INHALATIONS BY MOUTH  EVERY 6 HOURS AS NEEDED FOR  WHEEZING OR SHORTNESS OF BREATH 08/07/23   Scoggins, Hospital doctor, NP  B Complex Vitamins (VITAMIN B COMPLEX) CAPS Take by mouth.    [provider]  baclofen  (LIORESAL ) 10 MG tablet Take 10 mg by mouth 2 (two) times daily.    [provider]  benzoyl peroxide -erythromycin  (BENZAMYCIN) gel Apply to affected area 2 times daily 03/24/23  03/16/24  Scoggins, Amber, NP  Beta Carotene (VITAMIN A) 25000 UNIT capsule Take 25,000 Units by mouth daily.    [provider]  calcium  carbonate (OS-CAL) 600 MG TABS tablet Take by mouth.    [provider]  cholecalciferol  (VITAMIN D ) 1000 UNITS tablet Take 1,000 Units by mouth 2 (two) times daily.     [provider]  clopidogrel  (PLAVIX ) 75 MG tablet TAKE 1 TABLET BY MOUTH DAILY 07/31/23   Scoggins, Amber, NP  EPINEPHrine  0.3 mg/0.3 mL IJ SOAJ injection Inject 0.3 mg into the muscle as needed for anaphylaxis. 03/24/23   Scoggins, Hospital doctor, NP  Multiple Vitamins-Minerals (HAIR SKIN AND NAILS FORMULA) TABS Take 2 tablets by mouth daily.    [provider]  NARCAN  4 MG/0.1ML LIQD nasal spray kit  02/11/19   [provider]  OXYCODONE  HCL PO Take 15 mg by mouth as needed. Take 4 times a day as needed.    [provider]  pregabalin  (LYRICA ) 25 MG capsule Take 1 capsule by mouth every other day. 04/01/19   [provider]  promethazine  (PHENERGAN ) 25 MG tablet Take 1 tablet (25 mg total) by mouth every 6 (six) hours as needed for nausea or vomiting. 03/24/23   Scoggins, Amber, NP  rizatriptan (MAXALT-MLT) 10 MG disintegrating tablet Take 10 mg by mouth as needed for migraine. May repeat in 2 hours if needed    Maree Jannett POUR, MD  rOPINIRole  (REQUIP ) 2 MG tablet Take 1 tablet (2 mg total) by mouth at bedtime. 08/07/23   Scoggins, Hospital doctor, NP  rosuvastatin  (CRESTOR ) 40 MG tablet TAKE 1 TABLET BY MOUTH AT  BEDTIME 05/29/23   Scoggins, Amber, NP  spironolactone (ALDACTONE) 100 MG tablet Take 100 mg by mouth daily. 02/21/23   [provider]  topiramate  (TOPAMAX ) 100 MG tablet Take 200 mg by mouth daily.     [provider]  trazodone  (DESYREL ) 300 MG tablet TAKE 1 TABLET BY MOUTH AT  BEDTIME 08/07/23   Scoggins, [facility], NP  valACYclovir  (VALTREX ) 1000 MG tablet Take 1,000 mg by mouth 3 (three) times daily. 03/21/23   [provider]   vitamin E  200 UNIT capsule Take 200 Units by mouth daily.    [provider]    Allergies as of 07/25/2023 - Review Complete 06/20/2023  Allergen Reaction Noted   Iodine     Bee venom Hives and Rash    Cefoxitin Rash    Cefuroxime axetil Hives and Rash 02/17/2014   Cucumber extract Rash 04/13/2015   Gadolinium derivatives Swelling, Rash, and Other (See Comments) 12/05/2014   Iodinated contrast media Hives, Rash, and Other (See Comments) 02/17/2014   Latex Rash 02/17/2014   Other Hives and Rash 11/28/2013   Tomato Rash and Other (See Comments) 04/13/2015    Family History  Problem Relation Age of Onset   Breast cancer Mother 65       Glioblastoma also; deceased at 31   Diabetes Father    Lung cancer Father        mesothelioma; deceased 55s   Cancer Maternal Aunt        bone ca; unk. primary   Colon cancer Maternal Grandfather        dx in 60s; deceased 84   Colon cancer Maternal Aunt        dx in 66s; currently 65s   Lung cancer Maternal Grandmother        smoker; deceased 48   Lung cancer Paternal Grandmother        smoker; deceased 53s   Breast cancer Cousin        dx 58s; daughter of mat aunt with unk. primary cancer   Cancer Other        distant cousin; unknown primary   Colon cancer Other        dx 77s; currently 5; maternal half-sister   Bipolar disorder Sister    Schizophrenia Sister     Social History   Socioeconomic History   Marital status: Divorced    Spouse name: Not on file   Number of children: 1   Years of education: Not on file   Highest education level: High school graduate  Occupational History    Comment: disabled  Tobacco Use   Smoking status: Every Day    Current packs/day: 1.00    Average packs/day: 1 pack/day for 51.6 years (51.6 ttl pk-yrs)    Types: Cigarettes    Start date: 66  Smokeless tobacco: Never  Vaping Use   Vaping status: Never Used  Substance and Sexual Activity   Alcohol use: No    Alcohol/week: 0.0  standard drinks of alcohol   Drug use: No   Sexual activity: Yes  Other Topics Concern   Not on file  Social History Narrative   Lives alone. Best friend is her support person.    Social Drivers of Corporate investment banker Strain: Low Risk  (07/25/2023)   Received from [facility]   Overall Financial Resource Strain (CARDIA)    Difficulty of Paying Living Expenses: Not hard at all  Food Insecurity: No Food Insecurity (07/25/2023)   Received from [facility]   Hunger Vital Sign    Within the past 12 months, you worried that your food would run out before you got the money to buy more.: Never true    Within the past 12 months, the food you bought just didn't last and you didn't have money to get more.: Never true  Transportation Needs: No Transportation Needs (07/25/2023)   Received from [facility] - Transportation    In the past 12 months, has lack of transportation kept you from medical appointments or from getting medications?: No    Lack of Transportation (Non-Medical): No  Physical Activity: Inactive (04/26/2017)   Exercise Vital Sign    Days of Exercise per Week: 0 days    Minutes of Exercise per Session: 0 min  Stress: Stress Concern Present (04/26/2017)   [employer] [facility] Occupational [facility] - Occupational Stress Questionnaire    Feeling of Stress : Very much  Social Connections: Unknown (04/26/2017)   Social Connection and Isolation Panel    Frequency of Communication with Friends and Family: Not on file    Frequency of Social Gatherings with Friends and Family: Not on file    Attends Religious Services: Never    Active Member of Clubs or Organizations: No    Attends Banker Meetings: Never    Marital Status: Divorced  Catering manager Violence: Not At Risk (04/26/2017)   Humiliation, Afraid, Rape, and Kick questionnaire    Fear of Current or Ex-Partner: No    Emotionally Abused: No     Physically Abused: No    Sexually Abused: No    Review of Systems: See HPI, otherwise negative ROS  Physical Exam: LMP 08/24/2008  General:   Alert,  pleasant and cooperative in NAD Head:  Normocephalic and atraumatic. Neck:  Supple; no masses or thyromegaly. Lungs:  Clear throughout to auscultation, normal respiratory effort.    Heart:  +S1, +S2, Regular rate and rhythm, No edema. Abdomen:  Soft, nontender and nondistended. Normal bowel sounds, without guarding, and without rebound.   Neurologic:  Alert and  oriented x4;  grossly normal neurologically.  Impression/Plan: Briana [name] is here for an colonoscopy to be performed for surveillance due to prior history of colon polyps   Risks, benefits, limitations, and alternatives regarding  colonoscopy have been reviewed with the patient.  Questions have been answered.  All parties agreeable.   Ruel Kung, MD  09/04/2023, 8:07 AM

## 2023-09-05 ENCOUNTER — Encounter: Payer: Self-pay | Admitting: Gastroenterology

## 2023-09-05 LAB — SURGICAL PATHOLOGY

## 2023-09-26 DIAGNOSIS — M19019 Primary osteoarthritis, unspecified shoulder: Secondary | ICD-10-CM | POA: Diagnosis not present

## 2023-09-26 DIAGNOSIS — G894 Chronic pain syndrome: Secondary | ICD-10-CM | POA: Diagnosis not present

## 2023-09-26 DIAGNOSIS — M25512 Pain in left shoulder: Secondary | ICD-10-CM | POA: Diagnosis not present

## 2023-09-26 DIAGNOSIS — F17210 Nicotine dependence, cigarettes, uncomplicated: Secondary | ICD-10-CM | POA: Diagnosis not present

## 2023-09-26 DIAGNOSIS — M5134 Other intervertebral disc degeneration, thoracic region: Secondary | ICD-10-CM | POA: Diagnosis not present

## 2023-10-01 ENCOUNTER — Other Ambulatory Visit: Payer: Self-pay | Admitting: Cardiology

## 2023-10-03 DIAGNOSIS — J449 Chronic obstructive pulmonary disease, unspecified: Secondary | ICD-10-CM | POA: Diagnosis not present

## 2023-10-04 ENCOUNTER — Encounter: Payer: Self-pay | Admitting: Cardiology

## 2023-10-04 DIAGNOSIS — D485 Neoplasm of uncertain behavior of skin: Secondary | ICD-10-CM | POA: Diagnosis not present

## 2023-10-04 DIAGNOSIS — L700 Acne vulgaris: Secondary | ICD-10-CM | POA: Diagnosis not present

## 2023-10-04 DIAGNOSIS — D2272 Melanocytic nevi of left lower limb, including hip: Secondary | ICD-10-CM | POA: Diagnosis not present

## 2023-10-04 DIAGNOSIS — D225 Melanocytic nevi of trunk: Secondary | ICD-10-CM | POA: Diagnosis not present

## 2023-10-04 DIAGNOSIS — L578 Other skin changes due to chronic exposure to nonionizing radiation: Secondary | ICD-10-CM | POA: Diagnosis not present

## 2023-10-04 DIAGNOSIS — Z86018 Personal history of other benign neoplasm: Secondary | ICD-10-CM | POA: Diagnosis not present

## 2023-10-04 DIAGNOSIS — L218 Other seborrheic dermatitis: Secondary | ICD-10-CM | POA: Diagnosis not present

## 2023-10-04 DIAGNOSIS — L738 Other specified follicular disorders: Secondary | ICD-10-CM | POA: Diagnosis not present

## 2023-10-06 ENCOUNTER — Other Ambulatory Visit: Payer: Self-pay | Admitting: Cardiology

## 2023-10-06 DIAGNOSIS — Z1283 Encounter for screening for malignant neoplasm of skin: Secondary | ICD-10-CM

## 2023-10-08 ENCOUNTER — Other Ambulatory Visit: Payer: Self-pay | Admitting: Cardiology

## 2023-10-08 DIAGNOSIS — G4700 Insomnia, unspecified: Secondary | ICD-10-CM

## 2023-10-09 ENCOUNTER — Telehealth: Payer: Self-pay

## 2023-10-09 NOTE — Telephone Encounter (Signed)
 LVM to schedule annual Lung CT.

## 2023-10-09 NOTE — Telephone Encounter (Signed)
 I have left patient a vm to call back

## 2023-10-18 ENCOUNTER — Other Ambulatory Visit: Payer: Self-pay | Admitting: Cardiology

## 2023-10-19 DIAGNOSIS — M542 Cervicalgia: Secondary | ICD-10-CM | POA: Diagnosis not present

## 2023-10-19 DIAGNOSIS — G894 Chronic pain syndrome: Secondary | ICD-10-CM | POA: Diagnosis not present

## 2023-10-19 DIAGNOSIS — M25561 Pain in right knee: Secondary | ICD-10-CM | POA: Diagnosis not present

## 2023-10-19 DIAGNOSIS — Z79891 Long term (current) use of opiate analgesic: Secondary | ICD-10-CM | POA: Diagnosis not present

## 2023-10-19 DIAGNOSIS — M25562 Pain in left knee: Secondary | ICD-10-CM | POA: Diagnosis not present

## 2023-10-19 DIAGNOSIS — M25512 Pain in left shoulder: Secondary | ICD-10-CM | POA: Diagnosis not present

## 2023-10-19 DIAGNOSIS — Z5181 Encounter for therapeutic drug level monitoring: Secondary | ICD-10-CM | POA: Diagnosis not present

## 2023-10-19 DIAGNOSIS — M5459 Other low back pain: Secondary | ICD-10-CM | POA: Diagnosis not present

## 2023-10-20 ENCOUNTER — Other Ambulatory Visit: Payer: Self-pay | Admitting: Internal Medicine

## 2023-10-20 ENCOUNTER — Ambulatory Visit
Admission: RE | Admit: 2023-10-20 | Discharge: 2023-10-20 | Disposition: A | Discharge status: Home / Self Care | Source: Ambulatory Visit | Attending: Internal Medicine | Admitting: Internal Medicine

## 2023-10-20 ENCOUNTER — Other Ambulatory Visit

## 2023-10-20 DIAGNOSIS — M25561 Pain in right knee: Secondary | ICD-10-CM | POA: Diagnosis not present

## 2023-10-20 DIAGNOSIS — S43002A Unspecified subluxation of left shoulder joint, initial encounter: Secondary | ICD-10-CM | POA: Diagnosis not present

## 2023-10-20 DIAGNOSIS — M4802 Spinal stenosis, cervical region: Secondary | ICD-10-CM

## 2023-10-20 DIAGNOSIS — M25562 Pain in left knee: Secondary | ICD-10-CM

## 2023-10-20 DIAGNOSIS — Z4789 Encounter for other orthopedic aftercare: Secondary | ICD-10-CM | POA: Diagnosis not present

## 2023-10-20 DIAGNOSIS — R7303 Prediabetes: Secondary | ICD-10-CM

## 2023-10-20 DIAGNOSIS — M5459 Other low back pain: Secondary | ICD-10-CM

## 2023-10-20 DIAGNOSIS — I10 Essential (primary) hypertension: Secondary | ICD-10-CM

## 2023-10-20 DIAGNOSIS — E782 Mixed hyperlipidemia: Secondary | ICD-10-CM

## 2023-10-20 DIAGNOSIS — M19012 Primary osteoarthritis, left shoulder: Secondary | ICD-10-CM | POA: Diagnosis not present

## 2023-10-20 DIAGNOSIS — M545 Low back pain, unspecified: Secondary | ICD-10-CM | POA: Diagnosis not present

## 2023-10-20 DIAGNOSIS — N182 Chronic kidney disease, stage 2 (mild): Secondary | ICD-10-CM | POA: Diagnosis not present

## 2023-10-20 DIAGNOSIS — Z1329 Encounter for screening for other suspected endocrine disorder: Secondary | ICD-10-CM

## 2023-10-20 DIAGNOSIS — M25512 Pain in left shoulder: Secondary | ICD-10-CM | POA: Diagnosis not present

## 2023-10-20 DIAGNOSIS — M778 Other enthesopathies, not elsewhere classified: Secondary | ICD-10-CM | POA: Diagnosis not present

## 2023-10-20 DIAGNOSIS — G8929 Other chronic pain: Secondary | ICD-10-CM | POA: Diagnosis not present

## 2023-10-20 DIAGNOSIS — M542 Cervicalgia: Secondary | ICD-10-CM | POA: Diagnosis not present

## 2023-10-21 LAB — CBC WITH DIFFERENTIAL/PLATELET
Basophils Absolute: 0.1 x10E3/uL (ref 0.0–0.2)
Basos: 1 %
EOS (ABSOLUTE): 0.2 x10E3/uL (ref 0.0–0.4)
Eos: 3 %
Hematocrit: 43.3 % (ref 34.0–46.6)
Hemoglobin: 14.3 g/dL (ref 11.1–15.9)
Immature Grans (Abs): 0.1 x10E3/uL (ref 0.0–0.1)
Immature Granulocytes: 1 %
Lymphocytes Absolute: 1.7 x10E3/uL (ref 0.7–3.1)
Lymphs: 19 %
MCH: 32.6 pg (ref 26.6–33.0)
MCHC: 33 g/dL (ref 31.5–35.7)
MCV: 99 fL — ABNORMAL HIGH (ref 79–97)
Monocytes Absolute: 0.5 x10E3/uL (ref 0.1–0.9)
Monocytes: 6 %
Neutrophils Absolute: 6.3 x10E3/uL (ref 1.4–7.0)
Neutrophils: 70 %
Platelets: 266 x10E3/uL (ref 150–450)
RBC: 4.38 x10E6/uL (ref 3.77–5.28)
RDW: 13.4 % (ref 11.7–15.4)
WBC: 8.8 x10E3/uL (ref 3.4–10.8)

## 2023-10-21 LAB — LIPID PANEL
Chol/HDL Ratio: 2.4 ratio (ref 0.0–4.4)
Cholesterol, Total: 104 mg/dL (ref 100–199)
HDL: 44 mg/dL (ref >39)
LDL Chol Calc (NIH): 34 mg/dL (ref 0–99)
Triglycerides: 158 mg/dL — ABNORMAL HIGH (ref 0–149)
VLDL Cholesterol Cal: 26 mg/dL (ref 5–40)

## 2023-10-21 LAB — CMP14+EGFR
ALT: 8 IU/L (ref 0–32)
AST: 9 IU/L (ref 0–40)
Albumin: 4.2 g/dL (ref 3.8–4.9)
Alkaline Phosphatase: 85 IU/L (ref 49–135)
BUN/Creatinine Ratio: 18 (ref 9–23)
BUN: 23 mg/dL (ref 6–24)
Bilirubin Total: 0.2 mg/dL (ref 0.0–1.2)
CO2: 19 mmol/L — ABNORMAL LOW (ref 20–29)
Calcium: 9.4 mg/dL (ref 8.7–10.2)
Chloride: 105 mmol/L (ref 96–106)
Creatinine, Ser: 1.29 mg/dL — ABNORMAL HIGH (ref 0.57–1.00)
Globulin, Total: 2.7 g/dL (ref 1.5–4.5)
Glucose: 91 mg/dL (ref 70–99)
Potassium: 4.2 mmol/L (ref 3.5–5.2)
Sodium: 138 mmol/L (ref 134–144)
Total Protein: 6.9 g/dL (ref 6.0–8.5)
eGFR: 48 mL/min/1.73 — ABNORMAL LOW (ref >59)

## 2023-10-21 LAB — HEMOGLOBIN A1C
Est. average glucose Bld gHb Est-mCnc: 117 mg/dL
Hgb A1c MFr Bld: 5.7 % — ABNORMAL HIGH (ref 4.8–5.6)

## 2023-10-21 LAB — TSH: TSH: 1.3 u[IU]/mL (ref 0.450–4.500)

## 2023-10-22 ENCOUNTER — Encounter: Payer: Self-pay | Admitting: Cardiology

## 2023-10-23 ENCOUNTER — Ambulatory Visit: Payer: Self-pay | Admitting: Cardiology

## 2023-10-23 ENCOUNTER — Encounter: Payer: Self-pay | Admitting: Cardiology

## 2023-10-23 ENCOUNTER — Ambulatory Visit (INDEPENDENT_AMBULATORY_CARE_PROVIDER_SITE_OTHER): Admitting: Cardiology

## 2023-10-23 ENCOUNTER — Other Ambulatory Visit: Payer: Self-pay

## 2023-10-23 VITALS — BP 110/68 | HR 90 | Ht 63.0 in | Wt 156.0 lb

## 2023-10-23 DIAGNOSIS — R7303 Prediabetes: Secondary | ICD-10-CM | POA: Diagnosis not present

## 2023-10-23 DIAGNOSIS — E782 Mixed hyperlipidemia: Secondary | ICD-10-CM | POA: Diagnosis not present

## 2023-10-23 MED ORDER — SPIRONOLACTONE 100 MG PO TABS: 100.0000 mg | ORAL_TABLET | Freq: Every day | ORAL | 0 refills | Status: AC

## 2023-10-23 MED ORDER — DEBROX 6.5 % OT SOLN: 5.0000 [drp] | Freq: Two times a day (BID) | OTIC | 2 refills | Status: AC

## 2023-10-23 NOTE — Progress Notes (Signed)
 Established Patient Office Visit  Subjective:  Patient ID: Briana [name], female    DOB: 1965/04/19  Age: 58 y.o. MRN: [id number]  Chief Complaint  Patient presents with   Follow-up    4 months follow up lab results     Patient in office for 4 month follow up, discuss recent lab results. Patient doing well. Complains of decreased hearing and ear pain left ear. On exam, wax build up, not impacted. Will send in Debrox ear drops.  Discussed recent lab work, Hgb A1c stable, kidney function stable, sees nephrology.  Up to date on mammogram, and colonoscopy. Continue same medications.     No other concerns at this time.   Past Medical History:  Diagnosis Date   (HFpEF) heart failure with preserved ejection fraction (HCC)    a.) TTE 11/08/2017: EF 50%, no RWMAs, norm RVSF, RVSP 22.8, triv MR, mild TR; b.) TEE 02/22/2021:; EF 60-65%, no LAA thrombus, ? PFO with (+) IAS within 3-6 cardiac cycles   Allergy    Anxiety    Aortic atherosclerosis (HCC)    Arthritis    Asthma    Bipolar disorder (HCC)    CAD (coronary artery disease) 12/21/2022   a.) LDCT 12/21/2022: age advanced  3 vessel CAD   Chronic pain syndrome    a.) on naloxone ; has naloxone  Rx available   CKD (chronic kidney disease), stage III (HCC)    COPD (chronic obstructive pulmonary disease) (HCC)    CVA (cerebral vascular accident) (HCC) 02/18/2021   a.) MRI brain 02/18/2021: tiny acute embolic infarct of the LEFT cerebellar hemisphere and LEFT cerebrum areas   CVA (cerebral vascular accident) (HCC) 02/20/2021   a.) MRI brain 02/20/2021: subcentimeter acute infarct RIGHT anterior cerebellar vermis; new since MRI 2 days prior (02/18/2021)   DDD (degenerative disc disease), cervical    DDD (degenerative disc disease), lumbar    Depression    Emphysema lung (HCC)    Fibromyalgia    Genetic testing 03/09/2017   Multi-Cancer panel (83 genes) @ [facility] - No pathogenic mutations detected   GERD (gastroesophageal reflux  disease)    History of IBS    HLD (hyperlipidemia)    HTN (hypertension)    Hyperthyroidism    Implantable loop recorder present 01/2021   Insomnia    a.) taskes trazodone  PRN   Invasive carcinoma of LEFT breast 2019   a.) clinical stage Ia (ER/PR +, HER2/neu over-expressing) --> Tx'd with surgical resection (lumpectomy) + systemic chemotherapy + XRT + truncated aromotase inhibitor therapy   Lacunar infarction (HCC) 01/29/2019   a.) noted on MRI brain 01/29/2019: 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 (new since 2014 imaging)   Long term current use of opiate analgesic    a.) has naloxone  Rx available   Migraine    Nephrolithiasis    On chronic clopidogrel  therapy    OSA (obstructive sleep apnea)    a.) unable to tolerate mask required for nocturnal PAP therapy   Patent foramen ovale with atrial septal aneurysm 02/22/2021   a.) TEE 02/22/2021: bubble study with (+) IAS observed within 3-6 cardiac cycles   Personal history of chemotherapy    Personal history of radiation therapy    PTSD (post-traumatic stress disorder)    Restless leg syndrome    a.) on ropinirole     Past Surgical History:  Procedure Laterality Date   ABDOMINAL HYSTERECTOMY  2008   BREAST BIOPSY Left 02/02/2017   us  core positive   BREAST  LUMPECTOMY Left 02/17/2017   BREAST LUMPECTOMY WITH NEEDLE LOCALIZATION Left 02/17/2017   Procedure: BREAST LUMPECTOMY WITH NEEDLE LOCALIZATION;  Surgeon: Nicholaus Selinda Birmingham, MD;  Location: [facility] ORS;  Service: General;  Laterality: Left;   CARPAL TUNNEL RELEASE Bilateral    COLONOSCOPY N/A 09/04/2023   Procedure: COLONOSCOPY;  Surgeon: Therisa Bi, MD;  Location: [facility] ENDOSCOPY;  Service: Gastroenterology;  Laterality: N/A;   cryotherapy     DILATION AND CURETTAGE OF UTERUS     ENDOMETRIAL ABLATION     EXTRACORPOREAL SHOCK WAVE LITHOTRIPSY Left 02/23/2023   Procedure: EXTRACORPOREAL SHOCK WAVE LITHOTRIPSY (ESWL);  Surgeon: Penne Knee, MD;  Location:  [facility] ORS;  Service: Urology;  Laterality: Left;   LAPAROSCOPIC OOPHERECTOMY Left    unsure which side but thinks its the left   LYSIS OF ADHESION Left 10/14/2015   Procedure: LYSIS OF ADHESION;  Surgeon: Franky Cranker, MD;  Location: [facility] ORS;  Service: Orthopedics;  Laterality: Left;   NECK SURGERY     lower neck fusion rods and screws   OOPHORECTOMY     one ovary removed    PORTA CATH INSERTION N/A 03/08/2017   Procedure: PORTA CATH INSERTION;  Surgeon: Jama Cordella MATSU, MD;  Location: [facility] INVASIVE CV LAB;  Service: Cardiovascular;  Laterality: N/A;   RESECTION DISTAL CLAVICAL Left 10/14/2015   Procedure: RESECTION DISTAL CLAVICAL;  Surgeon: Franky Cranker, MD;  Location: [facility];  Service: Orthopedics;  Laterality: Left;   SENTINEL NODE BIOPSY Left 02/17/2017   Procedure: SENTINEL NODE BIOPSY;  Surgeon: Nicholaus Selinda Birmingham, MD;  Location: [facility];  Service: General;  Laterality: Left;   SHOULDER ARTHROSCOPY WITH OPEN ROTATOR CUFF REPAIR Left 10/14/2015   Procedure: SHOULDER ARTHROSCOPY WITH OPEN ROTATOR CUFF REPAIR;  Surgeon: Franky Cranker, MD;  Location: [facility];  Service: Orthopedics;  Laterality: Left;   SHOULDER ARTHROSCOPY WITH OPEN ROTATOR CUFF REPAIR AND DISTAL CLAVICLE ACROMINECTOMY Right 09/03/2014   Procedure: RIGHT SHOULDER ARTHROSCOPY WITH MINI OPEN ROTATOR CUFF TEAR;  Surgeon: Franky Cranker, MD;  Location: [facility];  Service: Orthopedics;  Laterality: Right;  biceps tenodesis, arthroscopic subacromial decompression and distal clavicle incision   spinal injections     SUBACROMIAL DECOMPRESSION Left 10/14/2015   Procedure: SUBACROMIAL DECOMPRESSION;  Surgeon: Franky Cranker, MD;  Location: [facility];  Service: Orthopedics;  Laterality: Left;   TEE WITHOUT CARDIOVERSION N/A 02/22/2021   Procedure: TRANSESOPHAGEAL ECHOCARDIOGRAM (TEE);  Surgeon: Hester Wolm PARAS, MD;  Location: [facility];  Service: Cardiovascular;  Laterality: N/A;    Social History   Socioeconomic History    Marital status: Divorced    Spouse name: Not on file   Number of children: 1   Years of education: Not on file   Highest education level: High school graduate  Occupational History    Comment: disabled  Tobacco Use   Smoking status: Every Day    Current packs/day: 1.00    Average packs/day: 1 pack/day for 51.7 years (51.7 ttl pk-yrs)    Types: Cigarettes    Start date: 1974   Smokeless tobacco: Never  Vaping Use   Vaping status: Never Used  Substance and Sexual Activity   Alcohol use: No    Alcohol/week: 0.0 standard drinks of alcohol   Drug use: Yes    Types: Oxycodone     Comment: as prescribed, in pain management   Sexual activity: Yes  Other Topics Concern   Not on file  Social History Narrative   Lives alone. Best friend is her support person.  Social Drivers of Corporate investment banker Strain: Low Risk  (07/25/2023)   Received [facility]   Overall Financial Resource Strain (CARDIA)    Difficulty of Paying Living Expenses: Not hard at all  Food Insecurity: No Food Insecurity (07/25/2023)   Received from [facility]   Hunger Vital Sign    Within the past 12 months, you worried that your food would run out before you got the money to buy more.: Never true    Within the past 12 months, the food you bought just didn't last and you didn't have money to get more.: Never true  Transportation Needs: No Transportation Needs (07/25/2023)   [facility] - [facility]    In the past 12 months, has lack of transportation kept you from medical appointments or from getting medications?: No    Lack of Transportation (Non-Medical): No  Physical Activity: Inactive (04/26/2017)   Exercise Vital Sign    Days of Exercise per Week: 0 days    Minutes of Exercise per Session: 0 min  Stress: Stress Concern Present (04/26/2017)   [employer] of Occupational Health - Occupational Stress Questionnaire     Feeling of Stress : Very much  Social Connections: Unknown (04/26/2017)   Social Connection and Isolation Panel    Frequency of Communication with Friends and Family: Not on file    Frequency of Social Gatherings with Friends and Family: Not on file    Attends Religious Services: Never    Active Member of Clubs or Organizations: No    Attends Banker Meetings: Never    Marital Status: Divorced  Catering manager Violence: Not At Risk (04/26/2017)   Humiliation, Afraid, Rape, and Kick questionnaire    Fear of Current or Ex-Partner: No    Emotionally Abused: No    Physically Abused: No    Sexually Abused: No    Family History  Problem Relation Age of Onset   Breast cancer Mother 36       Glioblastoma also; deceased at 40   Diabetes Father    Lung cancer Father        mesothelioma; deceased 38s   Cancer Maternal Aunt        bone ca; unk. primary   Colon cancer Maternal Grandfather        dx in 61s; deceased 51   Colon cancer Maternal Aunt        dx in 20s; currently 44s   Lung cancer Maternal Grandmother        smoker; deceased 31   Lung cancer Paternal Grandmother        smoker; deceased 6s   Breast cancer Cousin        dx 54s; daughter of mat aunt with unk. primary cancer   Cancer Other        distant cousin; unknown primary   Colon cancer Other        dx 73s; currently 41; maternal half-sister   Bipolar disorder Sister    Schizophrenia Sister     Allergies  Allergen Reactions   Iodine    Bee Venom Hives and Rash   Cefoxitin Rash   Cefuroxime Axetil Hives and Rash   Cucumber Extract Rash   Gadolinium Derivatives Swelling, Rash and Other (See Comments)    NECK BECAME RED AND TONGUE WAS SWELLING SLIGHTLY PER PT   Iodinated Contrast Media Hives, Rash and Other (See Comments)  Latex Rash   Other Hives and Rash    Bee Stings-swelling/hives/rash Wool (textile fiber)-Rash/itching   Tomato Rash and Other (See Comments)    Red tomatoes     Outpatient Medications Prior to Visit  Medication Sig   albuterol  (VENTOLIN  HFA) 108 (90 Base) MCG/ACT inhaler USE 1 TO 2 INHALATIONS BY MOUTH  EVERY 6 HOURS AS NEEDED FOR  WHEEZING OR SHORTNESS OF BREATH   B Complex Vitamins (VITAMIN B COMPLEX) CAPS Take by mouth.   baclofen  (LIORESAL ) 10 MG tablet Take 10 mg by mouth 2 (two) times daily.   benzoyl peroxide -erythromycin  (BENZAMYCIN) gel Apply to affected area 2 times daily   Beta Carotene (VITAMIN A) 25000 UNIT capsule Take 25,000 Units by mouth daily.   calcium  carbonate (OS-CAL) 600 MG TABS tablet Take by mouth.   cholecalciferol  (VITAMIN D ) 1000 UNITS tablet Take 1,000 Units by mouth 2 (two) times daily.    clopidogrel  (PLAVIX ) 75 MG tablet TAKE 1 TABLET BY MOUTH DAILY   EPINEPHrine  0.3 mg/0.3 mL IJ SOAJ injection INJECT 1 PEN INTRAMUSCULARLY AS  NEEDED FOR ALLERGIC RESPONSE AS  DIRECTED BY MD. [facility]  HELP AFTER USE   Multiple Vitamins-Minerals (HAIR SKIN AND NAILS FORMULA) TABS Take 2 tablets by mouth daily.   NARCAN  4 MG/0.1ML LIQD nasal spray kit    OXYCODONE  HCL PO Take 15 mg by mouth as needed. Take 4 times a day as needed.   pregabalin  (LYRICA ) 25 MG capsule Take 1 capsule by mouth every other day.   promethazine  (PHENERGAN ) 25 MG tablet Take 1 tablet (25 mg total) by mouth every 6 (six) hours as needed for nausea or vomiting.   rizatriptan (MAXALT-MLT) 10 MG disintegrating tablet Take 10 mg by mouth as needed for migraine. May repeat in 2 hours if needed   rOPINIRole  (REQUIP ) 2 MG tablet TAKE 1 TABLET BY MOUTH AT  BEDTIME   rosuvastatin  (CRESTOR ) 40 MG tablet TAKE 1 TABLET BY MOUTH AT  BEDTIME   spironolactone  (ALDACTONE ) 100 MG tablet Take 1 tablet (100 mg total) by mouth daily.   topiramate  (TOPAMAX ) 100 MG tablet Take 200 mg by mouth daily.    trazodone  (DESYREL ) 300 MG tablet TAKE 1 TABLET BY MOUTH AT  BEDTIME   vitamin E  200 UNIT capsule Take 200 Units by mouth daily.   [DISCONTINUED] valACYclovir  (VALTREX ) 1000 MG  tablet Take 1,000 mg by mouth 3 (three) times daily. (Patient not taking: Reported on 09/04/2023)   No facility-administered medications prior to visit.    Review of Systems  Constitutional: Negative.   HENT:  Positive for ear pain and hearing loss.   Eyes: Negative.   Respiratory: Negative.  Negative for shortness of breath.   Cardiovascular: Negative.  Negative for chest pain.  Gastrointestinal: Negative.  Negative for abdominal pain, constipation and diarrhea.  Genitourinary: Negative.   Musculoskeletal:  Negative for joint pain and myalgias.  Skin: Negative.   Neurological: Negative.  Negative for dizziness and headaches.  Endo/Heme/Allergies: Negative.   All other systems reviewed and are negative.      Objective:   BP 110/68   Pulse 90   Ht 5' 3 (1.6 m)   Wt 156 lb (70.8 kg)   LMP 08/24/2008   SpO2 97%   BMI 27.63 kg/m   Vitals:   10/23/23 1300  BP: 110/68  Pulse: 90  Height: 5' 3 (1.6 m)  Weight: 156 lb (70.8 kg)  SpO2: 97%  BMI (Calculated): 27.64    Physical Exam Vitals  and nursing note reviewed.  Constitutional:      Appearance: Normal appearance. She is normal weight.  HENT:     Head: Normocephalic and atraumatic.     Nose: Nose normal.     Mouth/Throat:     Mouth: Mucous membranes are moist.  Eyes:     Extraocular Movements: Extraocular movements intact.     Conjunctiva/sclera: Conjunctivae normal.     Pupils: Pupils are equal, round, and reactive to light.  Cardiovascular:     Rate and Rhythm: Normal rate and regular rhythm.     Pulses: Normal pulses.     Heart sounds: Normal heart sounds.  Pulmonary:     Effort: Pulmonary effort is normal.     Breath sounds: Normal breath sounds.  Abdominal:     General: Abdomen is flat. Bowel sounds are normal.     Palpations: Abdomen is soft.  Musculoskeletal:        General: Normal range of motion.     Cervical back: Normal range of motion.  Skin:    General: Skin is warm and dry.  Neurological:      General: No focal deficit present.     Mental Status: She is alert and oriented to person, place, and time.  Psychiatric:        Mood and Affect: Mood normal.        Behavior: Behavior normal.        Thought Content: Thought content normal.        Judgment: Judgment normal.      No results found for any visits on 10/23/23.  Recent Results (from the past 2160 hours)  Surgical pathology     Status: None   Collection Time: 09/04/23 12:00 AM  Result Value Ref Range   SURGICAL PATHOLOGY      SURGICAL PATHOLOGY [facility] [address] [place], [place] [address] Telephone [phone] or [phone] Fax [phone]  REPORT OF SURGICAL PATHOLOGY   Accession #: [id number] Patient Name: DRUSCILLA, PETSCH Visit # : [id number]  MRN: [id number] Physician: Therisa Bi DOB/Age [birth year] (Age: 18) Gender: F Collected Date: 09/04/2023 Received Date: 09/04/2023  FINAL DIAGNOSIS       1. Ascending  Colon Polyp, cold snare x2 :       - TUBULAR ADENOMA(S)      - NEGATIVE FOR HIGH-GRADE DYSPLASIA OR MALIGNANCY       DATE SIGNED OUT: 09/05/2023 ELECTRONIC SIGNATURE : Rebbecca Md, Nilesh, Pathologist, Electronic Signature  MICROSCOPIC DESCRIPTION  CASE COMMENTS STAINS USED IN DIAGNOSIS: H&E    CLINICAL HISTORY  SPECIMEN(S) OBTAINED 1. Ascending  Colon Polyp, Cold Snare X2  SPECIMEN COMMENTS: SPECIMEN CLINICAL INFORMATION: 1. H/O TA polyps.  Colon polyps, diverticulosis    Gross Description 1. Received in formalin  are 4 fragments of tan, soft tissue ranging from 0.1 to 0.6 cm submitted entirely in 1 block. mb 09/04/2023        Report signed out from the following location(s) [place] [facility] [address] [place], [place] [address] CLIA #: [id number]  [facility] [address] [place], [place] [address] CLIA #: [id number]   TSH     Status: None   Collection Time: 10/20/23 10:56 AM  Result  Value Ref Range   TSH 1.300 0.450 - 4.500 uIU/mL  Hemoglobin A1c     Status: Abnormal   Collection Time: 10/20/23 10:56 AM  Result Value Ref Range   Hgb A1c MFr  Bld 5.7 (H) 4.8 - 5.6 %    Comment:          Prediabetes: 5.7 - 6.4          Diabetes: >6.4          Glycemic control for adults with diabetes: <7.0    Est. average glucose Bld gHb Est-mCnc 117 mg/dL  Lipid Profile     Status: Abnormal   Collection Time: 10/20/23 10:56 AM  Result Value Ref Range   Cholesterol, Total 104 100 - 199 mg/dL   Triglycerides 841 (H) 0 - 149 mg/dL   HDL 44 >60 mg/dL   VLDL Cholesterol Cal 26 5 - 40 mg/dL   LDL Chol Calc (NIH) 34 0 - 99 mg/dL   Chol/HDL Ratio 2.4 0.0 - 4.4 ratio    Comment:                                   T. Chol/HDL Ratio                                             Men  Women                               1/2 Avg.Risk  3.4    3.3                                   Avg.Risk  5.0    4.4                                2X Avg.Risk  9.6    7.1                                3X Avg.Risk 23.4   11.0   CMP14+EGFR     Status: Abnormal   Collection Time: 10/20/23 10:56 AM  Result Value Ref Range   Glucose 91 70 - 99 mg/dL   BUN 23 6 - 24 mg/dL   Creatinine, Ser 8.70 (H) 0.57 - 1.00 mg/dL   eGFR 48 (L) >40 fO/fpw/8.26   BUN/Creatinine Ratio 18 9 - 23   Sodium 138 134 - 144 mmol/L   Potassium 4.2 3.5 - 5.2 mmol/L   Chloride 105 96 - 106 mmol/L   CO2 19 (L) 20 - 29 mmol/L   Calcium  9.4 8.7 - 10.2 mg/dL   Total Protein 6.9 6.0 - 8.5 g/dL   Albumin 4.2 3.8 - 4.9 g/dL   Globulin, Total 2.7 1.5 - 4.5 g/dL   Bilirubin Total 0.2 0.0 - 1.2 mg/dL   Alkaline Phosphatase 85 49 - 135 IU/L    Comment:               **Please note reference interval change**   AST 9 0 - 40 IU/L   ALT 8 0 - 32 IU/L  CBC with Diff     Status: Abnormal   Collection Time: 10/20/23 10:56 AM  Result Value Ref Range   WBC 8.8 3.4 - 10.8 x10E3/uL   RBC 4.38  3.77 - 5.28 x10E6/uL   Hemoglobin 14.3 11.1 - 15.9 g/dL    Hematocrit 56.6 65.9 - 46.6 %   MCV 99 (H) 79 - 97 fL   MCH 32.6 26.6 - 33.0 pg   MCHC 33.0 31.5 - 35.7 g/dL   RDW 86.5 88.2 - 84.5 %   Platelets 266 150 - 450 x10E3/uL   Neutrophils 70 Not Estab. %   Lymphs 19 Not Estab. %   Monocytes 6 Not Estab. %   Eos 3 Not Estab. %   Basos 1 Not Estab. %   Neutrophils Absolute 6.3 1.4 - 7.0 x10E3/uL   Lymphocytes Absolute 1.7 0.7 - 3.1 x10E3/uL   Monocytes Absolute 0.5 0.1 - 0.9 x10E3/uL   EOS (ABSOLUTE) 0.2 0.0 - 0.4 x10E3/uL   Basophils Absolute 0.1 0.0 - 0.2 x10E3/uL   Immature Granulocytes 1 Not Estab. %   Immature Grans (Abs) 0.1 0.0 - 0.1 x10E3/uL      Assessment & Plan:  Debrox Continue same medications.   Problem List Items Addressed This Visit       Other   HLD (hyperlipidemia)   Prediabetes - Primary    Return in about 4 months (around 02/22/2024) for fasting lab work prior.   Total time spent: 25 minutes  Google, NP  10/23/2023   This document may have been prepared by Dragon Voice Recognition software and as such may include unintentional dictation errors.

## 2023-10-26 ENCOUNTER — Other Ambulatory Visit: Payer: Self-pay | Admitting: Cardiology

## 2023-10-31 DIAGNOSIS — M25561 Pain in right knee: Secondary | ICD-10-CM | POA: Diagnosis not present

## 2023-10-31 DIAGNOSIS — M797 Fibromyalgia: Secondary | ICD-10-CM | POA: Diagnosis not present

## 2023-10-31 DIAGNOSIS — M542 Cervicalgia: Secondary | ICD-10-CM | POA: Diagnosis not present

## 2023-10-31 DIAGNOSIS — M25562 Pain in left knee: Secondary | ICD-10-CM | POA: Diagnosis not present

## 2023-10-31 DIAGNOSIS — M25512 Pain in left shoulder: Secondary | ICD-10-CM | POA: Diagnosis not present

## 2023-10-31 DIAGNOSIS — M5459 Other low back pain: Secondary | ICD-10-CM | POA: Diagnosis not present

## 2023-10-31 DIAGNOSIS — G894 Chronic pain syndrome: Secondary | ICD-10-CM | POA: Diagnosis not present

## 2023-10-31 DIAGNOSIS — Z5181 Encounter for therapeutic drug level monitoring: Secondary | ICD-10-CM | POA: Diagnosis not present

## 2023-10-31 DIAGNOSIS — Z79891 Long term (current) use of opiate analgesic: Secondary | ICD-10-CM | POA: Diagnosis not present

## 2023-11-03 ENCOUNTER — Ambulatory Visit: Payer: 59 | Admitting: Nurse Practitioner

## 2023-11-07 ENCOUNTER — Inpatient Hospital Stay: Payer: 59 | Attending: Nurse Practitioner | Admitting: Nurse Practitioner

## 2023-11-07 ENCOUNTER — Encounter: Payer: Self-pay | Admitting: Nurse Practitioner

## 2023-11-07 VITALS — BP 102/69 | HR 90 | Temp 98.2°F | Resp 16 | Wt 157.0 lb

## 2023-11-07 DIAGNOSIS — Z08 Encounter for follow-up examination after completed treatment for malignant neoplasm: Secondary | ICD-10-CM | POA: Diagnosis not present

## 2023-11-07 DIAGNOSIS — C50412 Malignant neoplasm of upper-outer quadrant of left female breast: Secondary | ICD-10-CM | POA: Diagnosis present

## 2023-11-07 DIAGNOSIS — Z1721 Progesterone receptor positive status: Secondary | ICD-10-CM | POA: Diagnosis present

## 2023-11-07 DIAGNOSIS — Z79891 Long term (current) use of opiate analgesic: Secondary | ICD-10-CM | POA: Insufficient documentation

## 2023-11-07 DIAGNOSIS — Z853 Personal history of malignant neoplasm of breast: Secondary | ICD-10-CM

## 2023-11-07 DIAGNOSIS — Z170 Estrogen receptor positive status [ER+]: Secondary | ICD-10-CM | POA: Insufficient documentation

## 2023-11-07 DIAGNOSIS — Z1732 Human epidermal growth factor receptor 2 negative status: Secondary | ICD-10-CM | POA: Diagnosis not present

## 2023-11-07 DIAGNOSIS — Z8673 Personal history of transient ischemic attack (TIA), and cerebral infarction without residual deficits: Secondary | ICD-10-CM | POA: Insufficient documentation

## 2023-11-07 NOTE — Progress Notes (Signed)
 [facility]  [phone]) [phone] [phone]) [phone]  ID: Olam CHRISTELLA Rouleau OB: 16-Sep-1965  MR#: [id number]  RDW#:[id number]  Patient Care Team: Carin Gauze, NP as PCP - General (Cardiology) Cindie Jesusa CHRISTELLA, RN as Oncology Nurse Navigator Jacobo, Evalene PARAS, MD as Consulting Physician (Oncology) Nicholaus Selinda Birmingham, MD (Inactive) as Consulting Physician (General Surgery) Lenn Aran, MD as Referring Physician (Radiation Oncology) Bosie Vinie LABOR, MD as Consulting Physician (Cardiology)  CHIEF COMPLAINT: Clinical stage Ia ER/PR positive, 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 She discontinued anastrozole  d/t multiple CVAs in January 2023. She had cardiac monitor implanted in her left chest following. She performs self breast exams. Complains of mass in her left breast but is unsure of where cardiac device was placed. No pain or skin changes. Has dry skin on superior aspect of her breast. No unintentional weight loss. Denies other complaints.   REVIEW OF SYSTEMS:   Review of Systems  Constitutional: Negative.  Negative for fever, malaise/fatigue and weight loss.  Respiratory: Negative.  Negative for cough, hemoptysis and shortness of breath.   Cardiovascular: Negative.  Negative for chest pain, palpitations and leg swelling.  Gastrointestinal: Negative.  Negative for abdominal pain.  Genitourinary: Negative.  Negative for dysuria.  Musculoskeletal:  Positive for back pain. Negative for joint pain.  Skin: Negative.  Negative for itching and rash.  Neurological: Negative.  Negative for dizziness, sensory change, focal weakness, loss of consciousness, weakness and headaches.  Psychiatric/Behavioral: Negative.  The patient is not nervous/anxious and does not have insomnia.   As per HPI. Otherwise, a  complete review of systems is negative.  PAST MEDICAL HISTORY: Past Medical History:  Diagnosis Date   (HFpEF) heart failure with preserved ejection fraction (HCC)    a.) TTE 11/08/2017: EF 50%, no RWMAs, norm RVSF, RVSP 22.8, triv MR, mild TR; b.) TEE 02/22/2021:; EF 60-65%, no LAA thrombus, ? PFO with (+) IAS within 3-6 cardiac cycles   Allergy    Anxiety    Aortic atherosclerosis    Arthritis    Asthma    Bipolar disorder (HCC)    CAD (coronary artery disease) 12/21/2022   a.) LDCT 12/21/2022: age advanced  3 vessel CAD   Chronic pain syndrome    a.) on naloxone ; has naloxone  Rx available   CKD (chronic kidney disease), stage III (HCC)    COPD (chronic obstructive pulmonary disease) (HCC)    CVA (cerebral vascular accident) (HCC) 02/18/2021   a.) MRI brain 02/18/2021: tiny acute embolic infarct of the LEFT cerebellar hemisphere and LEFT cerebrum areas   CVA (cerebral vascular accident) (HCC) 02/20/2021   a.) MRI brain 02/20/2021: subcentimeter acute infarct RIGHT anterior cerebellar vermis; new since MRI 2 days prior (02/18/2021)   DDD (degenerative disc disease), cervical    DDD (degenerative disc disease), lumbar    Depression    Emphysema lung (HCC)    Fibromyalgia    Genetic testing 03/09/2017   Multi-Cancer panel (83 genes) @ [facility] - No pathogenic mutations detected   GERD (gastroesophageal reflux disease)    History of IBS    HLD (hyperlipidemia)    HTN (hypertension)    Hyperthyroidism    Implantable loop recorder present 01/2021   Insomnia    a.) taskes trazodone  PRN   Invasive carcinoma of LEFT breast 2019   a.) clinical stage Ia (ER/PR +, HER2/neu over-expressing) -->  Tx'd with surgical resection (lumpectomy) + systemic chemotherapy + XRT + truncated aromotase inhibitor therapy   Lacunar infarction (HCC) 01/29/2019   a.) noted on MRI brain 01/29/2019: 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 (new since 2014 imaging)   Long term current use  of opiate analgesic    a.) has naloxone  Rx available   Migraine    Nephrolithiasis    On chronic clopidogrel  therapy    OSA (obstructive sleep apnea)    a.) unable to tolerate mask required for nocturnal PAP therapy   Patent foramen ovale with atrial septal aneurysm 02/22/2021   a.) TEE 02/22/2021: bubble study with (+) IAS observed within 3-6 cardiac cycles   Personal history of chemotherapy    Personal history of radiation therapy    PTSD (post-traumatic stress disorder)    Restless leg syndrome    a.) on ropinirole     PAST SURGICAL HISTORY: Past Surgical History:  Procedure Laterality Date   ABDOMINAL HYSTERECTOMY  2008   BREAST BIOPSY Left 02/02/2017   us  core positive   BREAST LUMPECTOMY Left 02/17/2017   BREAST LUMPECTOMY WITH NEEDLE LOCALIZATION Left 02/17/2017   Procedure: BREAST LUMPECTOMY WITH NEEDLE LOCALIZATION;  Surgeon: Nicholaus Selinda Birmingham, MD;  Location: [facility] ORS;  Service: General;  Laterality: Left;   CARPAL TUNNEL RELEASE Bilateral    COLONOSCOPY N/A 09/04/2023   Procedure: COLONOSCOPY;  Surgeon: Therisa Bi, MD;  Location: [facility] ENDOSCOPY;  Service: Gastroenterology;  Laterality: N/A;   cryotherapy     DILATION AND CURETTAGE OF UTERUS     ENDOMETRIAL ABLATION     EXTRACORPOREAL SHOCK WAVE LITHOTRIPSY Left 02/23/2023   Procedure: EXTRACORPOREAL SHOCK WAVE LITHOTRIPSY (ESWL);  Surgeon: Penne Knee, MD;  Location: [facility] ORS;  Service: Urology;  Laterality: Left;   LAPAROSCOPIC OOPHERECTOMY Left    unsure which side but thinks its the left   LYSIS OF ADHESION Left 10/14/2015   Procedure: LYSIS OF ADHESION;  Surgeon: Franky Cranker, MD;  Location: [facility] ORS;  Service: Orthopedics;  Laterality: Left;   NECK SURGERY     lower neck fusion rods and screws   OOPHORECTOMY     one ovary removed    PORTA CATH INSERTION N/A 03/08/2017   Procedure: PORTA CATH INSERTION;  Surgeon: Jama Cordella MATSU, MD;  Location: [facility] INVASIVE CV LAB;  Service: Cardiovascular;  Laterality:  N/A;   RESECTION DISTAL CLAVICAL Left 10/14/2015   Procedure: RESECTION DISTAL CLAVICAL;  Surgeon: Franky Cranker, MD;  Location: [facility] ORS;  Service: Orthopedics;  Laterality: Left;   SENTINEL NODE BIOPSY Left 02/17/2017   Procedure: SENTINEL NODE BIOPSY;  Surgeon: Nicholaus Selinda Birmingham, MD;  Location: [facility] ORS;  Service: General;  Laterality: Left;   SHOULDER ARTHROSCOPY WITH OPEN ROTATOR CUFF REPAIR Left 10/14/2015   Procedure: SHOULDER ARTHROSCOPY WITH OPEN ROTATOR CUFF REPAIR;  Surgeon: Franky Cranker, MD;  Location: [facility] ORS;  Service: Orthopedics;  Laterality: Left;   SHOULDER ARTHROSCOPY WITH OPEN ROTATOR CUFF REPAIR AND DISTAL CLAVICLE ACROMINECTOMY Right 09/03/2014   Procedure: RIGHT SHOULDER ARTHROSCOPY WITH MINI OPEN ROTATOR CUFF TEAR;  Surgeon: Franky Cranker, MD;  Location: [facility] ORS;  Service: Orthopedics;  Laterality: Right;  biceps tenodesis, arthroscopic subacromial decompression and distal clavicle incision   spinal injections     SUBACROMIAL DECOMPRESSION Left 10/14/2015   Procedure: SUBACROMIAL DECOMPRESSION;  Surgeon: Franky Cranker, MD;  Location: [facility] ORS;  Service: Orthopedics;  Laterality: Left;   TEE WITHOUT CARDIOVERSION N/A 02/22/2021   Procedure: TRANSESOPHAGEAL ECHOCARDIOGRAM (TEE);  Surgeon: Hester Wolm PARAS, MD;  Location: [facility] ORS;  Service: Cardiovascular;  Laterality: N/A;    FAMILY HISTORY: Family History  Problem Relation Age of Onset   Breast cancer Mother 52       Glioblastoma also; deceased at 79   Diabetes Father    Lung cancer Father        mesothelioma; deceased 39s   Cancer Maternal Aunt        bone ca; unk. primary   Colon cancer Maternal Grandfather        dx in 70s; deceased 43   Colon cancer Maternal Aunt        dx in 70s; currently 29s   Lung cancer Maternal Grandmother        smoker; deceased 74   Lung cancer Paternal Grandmother        smoker; deceased 60s   Breast cancer Cousin        dx 37s; daughter of mat aunt with unk. primary  cancer   Cancer Other        distant cousin; unknown primary   Colon cancer Other        dx 34s; currently 3; maternal half-sister   Bipolar disorder Sister    Schizophrenia Sister     ADVANCED DIRECTIVES (Y/N):  N  HEALTH MAINTENANCE: Social History   Tobacco Use   Smoking status: Every Day    Current packs/day: 1.00    Average packs/day: 1 pack/day for 51.8 years (51.8 ttl pk-yrs)    Types: Cigarettes    Start date: 12   Smokeless tobacco: Never  Vaping Use   Vaping status: Never Used  Substance Use Topics   Alcohol use: No    Alcohol/week: 0.0 standard drinks of alcohol   Drug use: Yes    Types: Oxycodone     Comment: as prescribed, in pain management    Colonoscopy:  PAP:  Bone density:  Lipid panel:  Allergies  Allergen Reactions   Iodine    Bee Venom Hives and Rash   Cefoxitin Rash   Cefuroxime Axetil Hives and Rash   Cucumber Extract Rash   Gadolinium Derivatives Swelling, Rash and Other (See Comments)    NECK BECAME RED AND TONGUE WAS SWELLING SLIGHTLY PER PT   Iodinated Contrast Media Hives, Rash and Other (See Comments)   Latex Rash   Other Hives and Rash    Bee Stings-swelling/hives/rash Wool (textile fiber)-Rash/itching   Tomato Rash and Other (See Comments)    Red tomatoes    Current Outpatient Medications  Medication Sig Dispense Refill   albuterol  (VENTOLIN  HFA) 108 (90 Base) MCG/ACT inhaler USE 1 TO 2 INHALATIONS BY MOUTH  EVERY 6 HOURS AS NEEDED FOR  WHEEZING OR SHORTNESS OF BREATH 72 g 2   B Complex Vitamins (VITAMIN B COMPLEX) CAPS Take by mouth.     baclofen  (LIORESAL ) 10 MG tablet Take 10 mg by mouth 2 (two) times daily.     Beta Carotene (VITAMIN A) 25000 UNIT capsule Take 25,000 Units by mouth daily.     calcium  carbonate (OS-CAL) 600 MG TABS tablet Take by mouth.     carbamide peroxide (DEBROX) 6.5 % OTIC solution Place 5 drops into the left ear 2 (two) times daily. 15 mL 2   cholecalciferol  (VITAMIN D ) 1000 UNITS tablet Take  1,000 Units by mouth 2 (two) times daily.      clopidogrel  (PLAVIX ) 75 MG tablet TAKE 1 TABLET BY MOUTH DAILY 90 tablet 3   Multiple Vitamins-Minerals (HAIR SKIN AND NAILS FORMULA) TABS  Take 2 tablets by mouth daily.     OXYCODONE  HCL PO Take 15 mg by mouth as needed. Take 4 times a day as needed.     pregabalin  (LYRICA ) 25 MG capsule Take 1 capsule by mouth every other day.     promethazine  (PHENERGAN ) 25 MG tablet TAKE 1 TABLET BY MOUTH EVERY 6  HOURS AS NEEDED FOR NAUSEA OR  VOMITING 90 tablet 0   rOPINIRole  (REQUIP ) 2 MG tablet TAKE 1 TABLET BY MOUTH AT  BEDTIME 90 tablet 3   rosuvastatin  (CRESTOR ) 40 MG tablet TAKE 1 TABLET BY MOUTH AT  BEDTIME 90 tablet 3   spironolactone  (ALDACTONE ) 100 MG tablet Take 1 tablet (100 mg total) by mouth daily. 90 tablet 0   topiramate  (TOPAMAX ) 100 MG tablet Take 200 mg by mouth daily.      trazodone  (DESYREL ) 300 MG tablet TAKE 1 TABLET BY MOUTH AT  BEDTIME 90 tablet 3   vitamin E  200 UNIT capsule Take 200 Units by mouth daily.     vitamin k 100 MCG tablet Take 100 mcg by mouth daily.     benzoyl peroxide -erythromycin  (BENZAMYCIN) gel Apply to affected area 2 times daily 47 g 0   EPINEPHrine  0.3 mg/0.3 mL IJ SOAJ injection INJECT 1 PEN INTRAMUSCULARLY AS  NEEDED FOR ALLERGIC RESPONSE AS  DIRECTED BY MD. [facility]  HELP AFTER USE (Patient not taking: Reported on 11/07/2023) 4 each 5   NARCAN  4 MG/0.1ML LIQD nasal spray kit  (Patient not taking: Reported on 11/07/2023)     rizatriptan (MAXALT-MLT) 10 MG disintegrating tablet Take 10 mg by mouth as needed for migraine. May repeat in 2 hours if needed     No current facility-administered medications for this visit.    OBJECTIVE: Vitals:   11/07/23 1510  BP: 102/69  Pulse: 90  Resp: 16  Temp: 98.2 F (36.8 C)  SpO2: 97%      Body mass index is 27.81 kg/m.    ECOG FS:0 - Asymptomatic  General: Well-developed, well-nourished, no acute distress. Eyes: Pink conjunctiva, anicteric sclera. Lungs: Clear  to auscultation bilaterally.  No audible wheezing or coughing Heart: Regular rate and rhythm. Clubbing of fingers.  Abdomen: Soft, nontender, nondistended.  Musculoskeletal: No edema, cyanosis, or clubbing. Neuro: Alert, answering all questions appropriately. Cranial nerves grossly intact. Skin: No rashes or petechiae noted. Psych: Normal affect. Breast exam was performed in seated position. Patient is status post left lumpectomy with a well-healed surgical scar. Cardiac device is palpable in left breast. No palpable masses. No evidence of axillary adenopathy. No evidence of any palpable masses or lumps in the right breast. No evidence of right axillary adenopathy   LAB RESULTS: No labs on site today  STUDIES: DG Lumbar Spine 2-3 Views Result Date: 10/26/2023 CLINICAL DATA:  Chronic low back pain EXAM: LUMBAR SPINE - 2-3 VIEW COMPARISON:  None Available. FINDINGS: Five non-rib-bearing lumbar vertebra. Normal lumbar alignment. Normal vertebral body heights. No fracture or compression deformity. Anterior spurring L2-L3 and L3-L4. The disc spaces are preserved. No evidence of focal bone lesion or bone destruction. Sacroiliac joints are congruent. IMPRESSION: Mild degenerative spurring at L2-L3 and L3-L4. Electronically Signed   By: Andrea Gasman M.D.   On: 10/26/2023 12:27   DG Shoulder Left Result Date: 10/26/2023 CLINICAL DATA:  Shoulder pain EXAM: LEFT SHOULDER - 2+ VIEW COMPARISON:  None Available. FINDINGS: Anchors in the lateral humeral head typical of prior rotator cuff surgery. Mild superior subluxation of the humeral head. Curvilinear  corticated density superior to the humeral head spans 2 cm. Moderate acromioclavicular degenerative spurring. Mild glenohumeral degenerative spurring. There is a subacromial spur. No erosive or bony destructive change. IMPRESSION: 1. Moderate acromioclavicular and mild glenohumeral degenerative change. 2. Curvilinear corticated density superior to the  humeral head may represent calcific tendinopathy or sequela of prior injury or surgery Electronically Signed   By: Andrea Gasman M.D.   On: 10/26/2023 12:26   DG Cervical Spine 2 or 3 views Result Date: 10/26/2023 CLINICAL DATA:  Cervical spinal stenosis.  Chronic pain. EXAM: CERVICAL SPINE - 2-3 VIEW COMPARISON:  Radiograph 03/26/2014 FINDINGS: The alignment is normal. Anterior fusion C5 through C7 with interbody spacers. The hardware is intact without periprosthetic lucency. Anterior spurring at C3-C4 and C4-C5 with preservation of disc spaces. Mild multilevel facet hypertrophy. No fracture, evidence of focal lesion or bone destruction. Carotid calcifications. IMPRESSION: 1. Anterior fusion C5 through C7 without hardware complication. 2. Anterior spurring at C3-C4 and C4-C5. Mild multilevel facet hypertrophy. Electronically Signed   By: Andrea Gasman M.D.   On: 10/26/2023 12:25   DG Knee 1-2 Views Right Result Date: 10/26/2023 CLINICAL DATA:  Right knee pain. EXAM: RIGHT KNEE - 1-2 VIEW COMPARISON:  None Available. FINDINGS: No evidence of fracture, dislocation, or joint effusion. The alignment and joint spaces are maintained. No evidence of arthropathy or other focal bone abnormality. Soft tissues are unremarkable. IMPRESSION: Negative radiographs of the right knee. Electronically Signed   By: Andrea Gasman M.D.   On: 10/26/2023 12:24   DG Knee 1-2 Views Left Result Date: 10/26/2023 CLINICAL DATA:  Left knee pain. EXAM: LEFT KNEE - 1-2 VIEW COMPARISON:  None Available. FINDINGS: No evidence of fracture, dislocation, or joint effusion. The alignment and joint spaces are normal. No evidence of arthropathy or other focal bone abnormality. Soft tissues are unremarkable. IMPRESSION: Negative radiographs of the left knee. Electronically Signed   By: Andrea Gasman M.D.   On: 10/26/2023 12:23   PER A&P  ASSESSMENT: Clinical stage Ia ER/PR positive, HER-2 over-expressing invasive carcinoma of the  upper outer quadrant of the left breast.  PLAN:    1. 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  Patient initiated adjuvant chemotherapy with Taxol  and Herceptin  on March 13, 2017.  She completed Taxol  on May 29, 2017.  Patient then received 4 additional cycles of Herceptin  maintenance treatment, but this was permanently discontinued on August 21, 2017 secondary to persistently low EF on MUGA scan.  Letrozole  was discontinued secondary to hot flashes and patient was transitioned to anastrozole . Although anastrozole  was not the etiology of her CVA, patient declined to reinitiate treatment.  She expressed understanding that her risk of recurrence increases without taking an aromatase inhibitor for total of 5 years. Patient most recent mammogram on May 01, 2023 was reported as BI-RADS 1. I independently reviewed mammograms and printed copies of images of her left breast. The palpable abnormality corresponds with the implanted cardiac device. No other masses appreciated. Repeat in March 2025. She has now been 6 years from completion of treatment and we can continue annual visits. Transition to PCP surveillance after 10 years. We discussed symptoms that would be concerning for recurrent disease and warrant sooner evaluation. NCCN survivorship topics reviewed.   2. Genetic testing: extensive family history of malignancy. [facility] genetic testing was negative.  3. Joint pain: chronic and unchanged. Not significantly bothersome.  4. Anxiety: Continue evaluation and treatment per psychiatry.  5. Chronic pain: Patient does not complain of this today.  Continue appointments with pain clinic as scheduled. 6. Cardiac disease: Herceptin  was previously discontinued permanently d/t low EF.  Continue follow-up and evaluation per cardiology.  Patient's most recent cardiac echo on February 22, 2021 revealed an ejection fraction of 60 to 65%.  Also reported was a positive bubble study consistent with a possible small PFO suggestive of intra-atrial shunt.  She is followed by cardiology, Dr Bosie.  7. Osteopenia: Bone mineral density on September 26, 2020 was unchanged from previous with a T score approximately -2.0.  August 2023 was improved with t score -1.8. Repeat August 2025 - 2.1. Stable. Continue calcium  and vitamin D . Repeat in August 2027.  8. Cancer screening- followed by pulmonology for lung cancer screening. Per patient her other cancer screenings are up to date and she receives these with her pcp.   Disposition:  March- mammogram 1 year- breast cancer surveillance- [place]  Patient expressed understanding and was in agreement with this plan. She also understands that She can call clinic at any time with any questions, concerns, or complaints.   Thank you for allowing me to participate in the care of this pleasant patient.    Cancer Staging  Malignant neoplasm of upper-outer quadrant of left breast in female, estrogen receptor positive (HCC) Staging form: Breast, AJCC 8th Edition - Clinical stage from 02/07/2017: Stage IA (cT1c, cN0, cM0, G1, ER+, PR+, HER2+) - Signed by Jacobo Evalene PARAS, MD on 02/11/2017 Neoadjuvant therapy: No Histologic grading system: 3 grade system Laterality: Left   Tinnie KANDICE Dawn, NP   11/07/2023   CC: Neale Carpen, NP

## 2023-11-07 NOTE — Progress Notes (Signed)
 Pt in for yearly follow up, reports increased fatigued. Reports left breast is itching on a regular basis and is concerned because that is how her breast felt when she was diagnosed.  Pt reports she does self exams and has not felt any lumps.

## 2023-11-13 DIAGNOSIS — G894 Chronic pain syndrome: Secondary | ICD-10-CM | POA: Diagnosis not present

## 2023-11-13 DIAGNOSIS — M5459 Other low back pain: Secondary | ICD-10-CM | POA: Diagnosis not present

## 2023-11-13 DIAGNOSIS — Z79891 Long term (current) use of opiate analgesic: Secondary | ICD-10-CM | POA: Diagnosis not present

## 2023-11-13 DIAGNOSIS — Z5181 Encounter for therapeutic drug level monitoring: Secondary | ICD-10-CM | POA: Diagnosis not present

## 2023-11-16 DIAGNOSIS — N200 Calculus of kidney: Secondary | ICD-10-CM | POA: Diagnosis not present

## 2023-11-16 DIAGNOSIS — E785 Hyperlipidemia, unspecified: Secondary | ICD-10-CM | POA: Diagnosis not present

## 2023-11-16 DIAGNOSIS — I10 Essential (primary) hypertension: Secondary | ICD-10-CM | POA: Diagnosis not present

## 2023-11-16 DIAGNOSIS — N1831 Chronic kidney disease, stage 3a: Secondary | ICD-10-CM | POA: Diagnosis not present

## 2023-11-28 ENCOUNTER — Encounter: Payer: Self-pay | Admitting: Cardiology

## 2023-11-28 DIAGNOSIS — Z79891 Long term (current) use of opiate analgesic: Secondary | ICD-10-CM | POA: Diagnosis not present

## 2023-11-28 DIAGNOSIS — M542 Cervicalgia: Secondary | ICD-10-CM | POA: Diagnosis not present

## 2023-11-28 DIAGNOSIS — M25561 Pain in right knee: Secondary | ICD-10-CM | POA: Diagnosis not present

## 2023-11-28 DIAGNOSIS — Z5181 Encounter for therapeutic drug level monitoring: Secondary | ICD-10-CM | POA: Diagnosis not present

## 2023-11-28 DIAGNOSIS — M5136 Other intervertebral disc degeneration, lumbar region with discogenic back pain only: Secondary | ICD-10-CM | POA: Diagnosis not present

## 2023-11-28 DIAGNOSIS — M5459 Other low back pain: Secondary | ICD-10-CM | POA: Diagnosis not present

## 2023-11-28 DIAGNOSIS — M25512 Pain in left shoulder: Secondary | ICD-10-CM | POA: Diagnosis not present

## 2023-11-28 DIAGNOSIS — F17290 Nicotine dependence, other tobacco product, uncomplicated: Secondary | ICD-10-CM | POA: Diagnosis not present

## 2023-11-28 DIAGNOSIS — M25562 Pain in left knee: Secondary | ICD-10-CM | POA: Diagnosis not present

## 2023-11-28 DIAGNOSIS — G894 Chronic pain syndrome: Secondary | ICD-10-CM | POA: Diagnosis not present

## 2023-12-03 ENCOUNTER — Encounter: Payer: Self-pay | Admitting: Cardiology

## 2023-12-03 DIAGNOSIS — J449 Chronic obstructive pulmonary disease, unspecified: Secondary | ICD-10-CM | POA: Diagnosis not present

## 2023-12-04 ENCOUNTER — Other Ambulatory Visit: Payer: Self-pay | Admitting: Cardiology

## 2023-12-04 DIAGNOSIS — M79604 Pain in right leg: Secondary | ICD-10-CM

## 2023-12-04 DIAGNOSIS — Z1379 Encounter for other screening for genetic and chromosomal anomalies: Secondary | ICD-10-CM

## 2023-12-04 DIAGNOSIS — M47812 Spondylosis without myelopathy or radiculopathy, cervical region: Secondary | ICD-10-CM

## 2023-12-18 ENCOUNTER — Ambulatory Visit (INDEPENDENT_AMBULATORY_CARE_PROVIDER_SITE_OTHER): Admitting: Cardiology

## 2023-12-18 ENCOUNTER — Other Ambulatory Visit: Payer: Self-pay | Admitting: Cardiology

## 2023-12-18 ENCOUNTER — Encounter: Payer: Self-pay | Admitting: Cardiology

## 2023-12-18 VITALS — BP 116/64 | HR 107 | Ht 63.0 in | Wt 157.4 lb

## 2023-12-18 DIAGNOSIS — Z0130 Encounter for examination of blood pressure without abnormal findings: Secondary | ICD-10-CM

## 2023-12-18 DIAGNOSIS — Z0000 Encounter for general adult medical examination without abnormal findings: Secondary | ICD-10-CM | POA: Diagnosis not present

## 2023-12-18 MED ORDER — FUROSEMIDE 20 MG PO TABS: 20.0000 mg | ORAL_TABLET | Freq: Every day | ORAL | 4 refills | Status: AC | PRN

## 2023-12-18 NOTE — Progress Notes (Signed)
 Established Patient Office Visit  Subjective:  Patient ID: Briana [name], female    DOB: 1965/10/31  Age: 58 y.o. MRN: [id number]  Chief Complaint  Patient presents with   Annual Exam    AWV    Patient in office for annual medicare wellness visit. No new complaints today.  Patient declines flu vaccine.    No other concerns at this time.   Past Medical History:  Diagnosis Date   (HFpEF) heart failure with preserved ejection fraction (HCC)    a.) TTE 11/08/2017: EF 50%, no RWMAs, norm RVSF, RVSP 22.8, triv MR, mild TR; b.) TEE 02/22/2021:; EF 60-65%, no LAA thrombus, ? PFO with (+) IAS within 3-6 cardiac cycles   Allergy    Anxiety    Aortic atherosclerosis    Arthritis    Asthma    Bipolar disorder (HCC)    CAD (coronary artery disease) 12/21/2022   a.) LDCT 12/21/2022: age advanced  3 vessel CAD   Chronic pain syndrome    a.) on naloxone ; has naloxone  Rx available   CKD (chronic kidney disease), stage III (HCC)    COPD (chronic obstructive pulmonary disease) (HCC)    CVA (cerebral vascular accident) (HCC) 02/18/2021   a.) MRI brain 02/18/2021: tiny acute embolic infarct of the LEFT cerebellar hemisphere and LEFT cerebrum areas   CVA (cerebral vascular accident) (HCC) 02/20/2021   a.) MRI brain 02/20/2021: subcentimeter acute infarct RIGHT anterior cerebellar vermis; new since MRI 2 days prior (02/18/2021)   DDD (degenerative disc disease), cervical    DDD (degenerative disc disease), lumbar    Depression    Emphysema lung (HCC)    Fibromyalgia    Genetic testing 03/09/2017   Multi-Cancer panel (83 genes) @ [facility] - No pathogenic mutations detected   GERD (gastroesophageal reflux disease)    History of IBS    HLD (hyperlipidemia)    HTN (hypertension)    Hyperthyroidism    Implantable loop recorder present 01/2021   Insomnia    a.) taskes trazodone  PRN   Invasive carcinoma of LEFT breast 2019   a.) clinical stage Ia (ER/PR +, HER2/neu over-expressing) --> Tx'd  with surgical resection (lumpectomy) + systemic chemotherapy + XRT + truncated aromotase inhibitor therapy   Lacunar infarction (HCC) 01/29/2019   a.) noted on MRI brain 01/29/2019: 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 (new since 2014 imaging)   Long term current use of opiate analgesic    a.) has naloxone  Rx available   Migraine    Nephrolithiasis    On chronic clopidogrel  therapy    OSA (obstructive sleep apnea)    a.) unable to tolerate mask required for nocturnal PAP therapy   Patent foramen ovale with atrial septal aneurysm 02/22/2021   a.) TEE 02/22/2021: bubble study with (+) IAS observed within 3-6 cardiac cycles   Personal history of chemotherapy    Personal history of radiation therapy    PTSD (post-traumatic stress disorder)    Restless leg syndrome    a.) on ropinirole     Past Surgical History:  Procedure Laterality Date   ABDOMINAL HYSTERECTOMY  2008   BREAST BIOPSY Left 02/02/2017   us  core positive   BREAST LUMPECTOMY Left 02/17/2017   BREAST LUMPECTOMY WITH NEEDLE LOCALIZATION Left 02/17/2017   Procedure: BREAST LUMPECTOMY WITH NEEDLE LOCALIZATION;  Surgeon: Nicholaus Selinda Birmingham, MD;  Location: [facility];  Service: General;  Laterality: Left;   CARPAL TUNNEL RELEASE Bilateral    COLONOSCOPY N/A 09/04/2023   Procedure:  COLONOSCOPY;  Surgeon: Therisa Bi, MD;  Location: [facility];  Service: Gastroenterology;  Laterality: N/A;   cryotherapy     DILATION AND CURETTAGE OF UTERUS     ENDOMETRIAL ABLATION     EXTRACORPOREAL SHOCK WAVE LITHOTRIPSY Left 02/23/2023   Procedure: EXTRACORPOREAL SHOCK WAVE LITHOTRIPSY (ESWL);  Surgeon: Penne Knee, MD;  Location: [facility];  Service: Urology;  Laterality: Left;   LAPAROSCOPIC OOPHERECTOMY Left    unsure which side but thinks its the left   LYSIS OF ADHESION Left 10/14/2015   Procedure: LYSIS OF ADHESION;  Surgeon: Franky Cranker, MD;  Location: [facility];  Service: Orthopedics;  Laterality: Left;    NECK SURGERY     lower neck fusion rods and screws   OOPHORECTOMY     one ovary removed    PORTA CATH INSERTION N/A 03/08/2017   Procedure: PORTA CATH INSERTION;  Surgeon: Jama Cordella MATSU, MD;  Location: [facility];  Service: Cardiovascular;  Laterality: N/A;   RESECTION DISTAL CLAVICAL Left 10/14/2015   Procedure: RESECTION DISTAL CLAVICAL;  Surgeon: Franky Cranker, MD;  Location: [facility];  Service: Orthopedics;  Laterality: Left;   SENTINEL NODE BIOPSY Left 02/17/2017   Procedure: SENTINEL NODE BIOPSY;  Surgeon: Nicholaus Selinda Birmingham, MD;  Location: [facility];  Service: General;  Laterality: Left;   SHOULDER ARTHROSCOPY WITH OPEN ROTATOR CUFF REPAIR Left 10/14/2015   Procedure: SHOULDER ARTHROSCOPY WITH OPEN ROTATOR CUFF REPAIR;  Surgeon: Franky Cranker, MD;  Location: [facility];  Service: Orthopedics;  Laterality: Left;   SHOULDER ARTHROSCOPY WITH OPEN ROTATOR CUFF REPAIR AND DISTAL CLAVICLE ACROMINECTOMY Right 09/03/2014   Procedure: RIGHT SHOULDER ARTHROSCOPY WITH MINI OPEN ROTATOR CUFF TEAR;  Surgeon: Franky Cranker, MD;  Location: [facility];  Service: Orthopedics;  Laterality: Right;  biceps tenodesis, arthroscopic subacromial decompression and distal clavicle incision   spinal injections     SUBACROMIAL DECOMPRESSION Left 10/14/2015   Procedure: SUBACROMIAL DECOMPRESSION;  Surgeon: Franky Cranker, MD;  Location: [facility];  Service: Orthopedics;  Laterality: Left;   TEE WITHOUT CARDIOVERSION N/A 02/22/2021   Procedure: TRANSESOPHAGEAL ECHOCARDIOGRAM (TEE);  Surgeon: Hester Wolm PARAS, MD;  Location: [facility];  Service: Cardiovascular;  Laterality: N/A;    Social History   Socioeconomic History   Marital status: Divorced    Spouse name: Not on file   Number of children: 1   Years of education: Not on file   Highest education level: High school graduate  Occupational History    Comment: disabled  Tobacco Use   Smoking status: Every Day    Current packs/day: 1.00    Average  packs/day: 1 pack/day for 51.9 years (51.9 ttl pk-yrs)    Types: Cigarettes    Start date: 1974   Smokeless tobacco: Never  Vaping Use   Vaping status: Never Used  Substance and Sexual Activity   Alcohol use: No    Alcohol/week: 0.0 standard drinks of alcohol   Drug use: Yes    Types: Oxycodone     Comment: as prescribed, in pain management   Sexual activity: Yes  Other Topics Concern   Not on file  Social History Narrative   Lives alone. Best friend is her support person.    Social Drivers of Corporate Investment Banker Strain: Low Risk  (07/25/2023)   Received from [facility]   Overall Financial Resource Strain (CARDIA)    Difficulty of Paying Living Expenses: Not hard at all  Food Insecurity: No Food Insecurity (07/25/2023)   Received from  [facility]   Hunger Vital Sign    Within the past 12 months, you worried that your food would run out before you got the money to buy more.: Never true    Within the past 12 months, the food you bought just didn't last and you didn't have money to get more.: Never true  Transportation Needs: No Transportation Needs (07/25/2023)   Received from [facility] - Transportation    In the past 12 months, has lack of transportation kept you from medical appointments or from getting medications?: No    Lack of Transportation (Non-Medical): No  Physical Activity: Insufficiently Active (12/18/2023)   Exercise Vital Sign    Days of Exercise per Week: 3 days    Minutes of Exercise per Session: 20 min  Stress: Stress Concern Present (04/26/2017)   [employer] [facility] [employer] - Occupational Stress Questionnaire    Feeling of Stress : Very much  Social Connections: Unknown (04/26/2017)   Social Connection and Isolation Panel    Frequency of Communication with Friends and Family: Not on file    Frequency of Social Gatherings with Friends and Family: Not on file    Attends  Religious Services: Never    Active Member of Clubs or Organizations: No    Attends Banker Meetings: Never    Marital Status: Divorced  Catering Manager Violence: Not At Risk (04/26/2017)   Humiliation, Afraid, Rape, and Kick questionnaire    Fear of Current or Ex-Partner: No    Emotionally Abused: No    Physically Abused: No    Sexually Abused: No    Family History  Problem Relation Age of Onset   Breast cancer Mother 82       Glioblastoma also; deceased at 82   Diabetes Father    Lung cancer Father        mesothelioma; deceased 47s   Cancer Maternal Aunt        bone ca; unk. primary   Colon cancer Maternal Grandfather        dx in 34s; deceased 35   Colon cancer Maternal Aunt        dx in 32s; currently 26s   Lung cancer Maternal Grandmother        smoker; deceased 35   Lung cancer Paternal Grandmother        smoker; deceased 65s   Breast cancer Cousin        dx 44s; daughter of mat aunt with unk. primary cancer   Cancer Other        distant cousin; unknown primary   Colon cancer Other        dx 41s; currently 62; maternal half-sister   Bipolar disorder Sister    Schizophrenia Sister     Allergies  Allergen Reactions   Iodine    Bee Venom Hives and Rash   Cefoxitin Rash   Cefuroxime Axetil Hives and Rash   Cucumber Extract Rash   Gadolinium Derivatives Swelling, Rash and Other (See Comments)    NECK BECAME RED AND TONGUE WAS SWELLING SLIGHTLY PER PT   Iodinated Contrast Media Hives, Rash and Other (See Comments)   Latex Rash   Other Hives and Rash    Bee Stings-swelling/hives/rash Wool (textile fiber)-Rash/itching   Tomato Rash and Other (See Comments)    Red tomatoes    Outpatient Medications Prior to Visit  Medication Sig   albuterol  (VENTOLIN  HFA) 108 (90 Base) MCG/ACT inhaler  USE 1 TO 2 INHALATIONS BY MOUTH  EVERY 6 HOURS AS NEEDED FOR  WHEEZING OR SHORTNESS OF BREATH   B Complex Vitamins (VITAMIN B COMPLEX) CAPS Take by mouth.    baclofen  (LIORESAL ) 10 MG tablet Take 10 mg by mouth 2 (two) times daily.   Beta Carotene (VITAMIN A) 25000 UNIT capsule Take 25,000 Units by mouth daily.   calcium  carbonate (OS-CAL) 600 MG TABS tablet Take by mouth.   carbamide peroxide (DEBROX) 6.5 % OTIC solution Place 5 drops into the left ear 2 (two) times daily.   cholecalciferol  (VITAMIN D ) 1000 UNITS tablet Take 1,000 Units by mouth 2 (two) times daily.    clopidogrel  (PLAVIX ) 75 MG tablet TAKE 1 TABLET BY MOUTH DAILY   Multiple Vitamins-Minerals (HAIR SKIN AND NAILS FORMULA) TABS Take 2 tablets by mouth daily.   OXYCODONE  HCL PO Take 15 mg by mouth as needed. Take 4 times a day as needed.   pregabalin  (LYRICA ) 25 MG capsule Take 1 capsule by mouth every other day.   promethazine  (PHENERGAN ) 25 MG tablet TAKE 1 TABLET BY MOUTH EVERY 6  HOURS AS NEEDED FOR NAUSEA OR  VOMITING   rOPINIRole  (REQUIP ) 2 MG tablet TAKE 1 TABLET BY MOUTH AT  BEDTIME   rosuvastatin  (CRESTOR ) 40 MG tablet TAKE 1 TABLET BY MOUTH AT  BEDTIME   spironolactone  (ALDACTONE ) 100 MG tablet Take 1 tablet (100 mg total) by mouth daily.   topiramate  (TOPAMAX ) 100 MG tablet Take 200 mg by mouth daily.    trazodone  (DESYREL ) 300 MG tablet TAKE 1 TABLET BY MOUTH AT  BEDTIME   vitamin E  200 UNIT capsule Take 200 Units by mouth daily.   vitamin k 100 MCG tablet Take 100 mcg by mouth daily.   benzoyl peroxide -erythromycin  (BENZAMYCIN) gel Apply to affected area 2 times daily (Patient not taking: Reported on 12/18/2023)   EPINEPHrine  0.3 mg/0.3 mL IJ SOAJ injection INJECT 1 PEN INTRAMUSCULARLY AS  NEEDED FOR ALLERGIC RESPONSE AS  DIRECTED BY MD. [facility]  HELP AFTER USE (Patient not taking: Reported on 12/18/2023)   NARCAN  4 MG/0.1ML LIQD nasal spray kit  (Patient not taking: Reported on 12/18/2023)   rizatriptan (MAXALT-MLT) 10 MG disintegrating tablet Take 10 mg by mouth as needed for migraine. May repeat in 2 hours if needed (Patient not taking: Reported on 12/18/2023)    No facility-administered medications prior to visit.    Review of Systems  Constitutional: Negative.   HENT: Negative.    Eyes: Negative.   Respiratory: Negative.  Negative for shortness of breath.   Cardiovascular: Negative.  Negative for chest pain.  Gastrointestinal: Negative.  Negative for abdominal pain, constipation and diarrhea.  Genitourinary: Negative.   Musculoskeletal:  Negative for joint pain and myalgias.  Skin: Negative.   Neurological: Negative.  Negative for dizziness and headaches.  Endo/Heme/Allergies: Negative.   All other systems reviewed and are negative.      Objective:   BP 116/64   Pulse (!) 107   Ht 5' 3 (1.6 m)   Wt 157 lb 6.4 oz (71.4 kg)   LMP 08/24/2008   SpO2 97%   BMI 27.88 kg/m   Vitals:   12/18/23 1256  BP: 116/64  Pulse: (!) 107  Height: 5' 3 (1.6 m)  Weight: 157 lb 6.4 oz (71.4 kg)  SpO2: 97%  BMI (Calculated): 27.89    Physical Exam Vitals and nursing note reviewed.  Constitutional:      Appearance: Normal appearance. She is normal weight.  HENT:     Head: Normocephalic and atraumatic.     Nose: Nose normal.     Mouth/Throat:     Mouth: Mucous membranes are moist.  Eyes:     Extraocular Movements: Extraocular movements intact.     Conjunctiva/sclera: Conjunctivae normal.     Pupils: Pupils are equal, round, and reactive to light.  Cardiovascular:     Rate and Rhythm: Normal rate and regular rhythm.     Pulses: Normal pulses.     Heart sounds: Normal heart sounds.  Pulmonary:     Effort: Pulmonary effort is normal.     Breath sounds: Normal breath sounds.  Abdominal:     General: Abdomen is flat. Bowel sounds are normal.     Palpations: Abdomen is soft.  Musculoskeletal:        General: Normal range of motion.     Cervical back: Normal range of motion.  Skin:    General: Skin is warm and dry.  Neurological:     General: No focal deficit present.     Mental Status: She is alert and oriented to person,  place, and time.  Psychiatric:        Mood and Affect: Mood normal.        Behavior: Behavior normal.        Thought Content: Thought content normal.        Judgment: Judgment normal.      No results found for any visits on 12/18/23.  Recent Results (from the past 2160 hours)  TSH     Status: None   Collection Time: 10/20/23 10:56 AM  Result Value Ref Range   TSH 1.300 0.450 - 4.500 uIU/mL  Hemoglobin A1c     Status: Abnormal   Collection Time: 10/20/23 10:56 AM  Result Value Ref Range   Hgb A1c MFr Bld 5.7 (H) 4.8 - 5.6 %    Comment:          Prediabetes: 5.7 - 6.4          Diabetes: >6.4          Glycemic control for adults with diabetes: <7.0    Est. average glucose Bld gHb Est-mCnc 117 mg/dL  Lipid Profile     Status: Abnormal   Collection Time: 10/20/23 10:56 AM  Result Value Ref Range   Cholesterol, Total 104 100 - 199 mg/dL   Triglycerides 841 (H) 0 - 149 mg/dL   HDL 44 >60 mg/dL   VLDL Cholesterol Cal 26 5 - 40 mg/dL   LDL Chol Calc (NIH) 34 0 - 99 mg/dL   Chol/HDL Ratio 2.4 0.0 - 4.4 ratio    Comment:                                   T. Chol/HDL Ratio                                             Men  Women                               1/2 Avg.Risk  3.4    3.3  Avg.Risk  5.0    4.4                                2X Avg.Risk  9.6    7.1                                3X Avg.Risk 23.4   11.0   CMP14+EGFR     Status: Abnormal   Collection Time: 10/20/23 10:56 AM  Result Value Ref Range   Glucose 91 70 - 99 mg/dL   BUN 23 6 - 24 mg/dL   Creatinine, Ser 8.70 (H) 0.57 - 1.00 mg/dL   eGFR 48 (L) >40 fO/fpw/8.26   BUN/Creatinine Ratio 18 9 - 23   Sodium 138 134 - 144 mmol/L   Potassium 4.2 3.5 - 5.2 mmol/L   Chloride 105 96 - 106 mmol/L   CO2 19 (L) 20 - 29 mmol/L   Calcium  9.4 8.7 - 10.2 mg/dL   Total Protein 6.9 6.0 - 8.5 g/dL   Albumin 4.2 3.8 - 4.9 g/dL   Globulin, Total 2.7 1.5 - 4.5 g/dL   Bilirubin Total 0.2 0.0 - 1.2  mg/dL   Alkaline Phosphatase 85 49 - 135 IU/L    Comment:               **Please note reference interval change**   AST 9 0 - 40 IU/L   ALT 8 0 - 32 IU/L  CBC with Diff     Status: Abnormal   Collection Time: 10/20/23 10:56 AM  Result Value Ref Range   WBC 8.8 3.4 - 10.8 x10E3/uL   RBC 4.38 3.77 - 5.28 x10E6/uL   Hemoglobin 14.3 11.1 - 15.9 g/dL   Hematocrit 56.6 65.9 - 46.6 %   MCV 99 (H) 79 - 97 fL   MCH 32.6 26.6 - 33.0 pg   MCHC 33.0 31.5 - 35.7 g/dL   RDW 86.5 88.2 - 84.5 %   Platelets 266 150 - 450 x10E3/uL   Neutrophils 70 Not Estab. %   Lymphs 19 Not Estab. %   Monocytes 6 Not Estab. %   Eos 3 Not Estab. %   Basos 1 Not Estab. %   Neutrophils Absolute 6.3 1.4 - 7.0 x10E3/uL   Lymphocytes Absolute 1.7 0.7 - 3.1 x10E3/uL   Monocytes Absolute 0.5 0.1 - 0.9 x10E3/uL   EOS (ABSOLUTE) 0.2 0.0 - 0.4 x10E3/uL   Basophils Absolute 0.1 0.0 - 0.2 x10E3/uL   Immature Granulocytes 1 Not Estab. %   Immature Grans (Abs) 0.1 0.0 - 0.1 x10E3/uL      Assessment & Plan:  Continue current medications  Problem List Items Addressed This Visit       Other   Encounter for annual health examination - Primary    Return if symptoms worsen or fail to improve, for as scheduled in January.   Total time spent: 25  minutes. This time includes review of previous notes and results and patient face to face interaction during today's visit.    Jeoffrey Pollen, NP  12/18/2023   This document may have been prepared by Dragon Voice Recognition software and as such may include unintentional dictation errors.

## 2023-12-21 LAB — HLA-B27 ANTIGEN: HLA B27: POSITIVE

## 2023-12-22 ENCOUNTER — Ambulatory Visit
Admission: RE | Admit: 2023-12-22 | Discharge: 2023-12-22 | Disposition: A | Discharge status: Home / Self Care | Source: Ambulatory Visit | Attending: Acute Care | Admitting: Acute Care

## 2023-12-22 DIAGNOSIS — F17210 Nicotine dependence, cigarettes, uncomplicated: Secondary | ICD-10-CM | POA: Diagnosis present

## 2023-12-22 DIAGNOSIS — Z122 Encounter for screening for malignant neoplasm of respiratory organs: Secondary | ICD-10-CM | POA: Insufficient documentation

## 2023-12-22 DIAGNOSIS — Z87891 Personal history of nicotine dependence: Secondary | ICD-10-CM | POA: Diagnosis present

## 2023-12-25 ENCOUNTER — Other Ambulatory Visit: Payer: Self-pay | Admitting: Cardiology

## 2024-01-02 ENCOUNTER — Telehealth: Payer: Self-pay

## 2024-01-02 DIAGNOSIS — Z122 Encounter for screening for malignant neoplasm of respiratory organs: Secondary | ICD-10-CM

## 2024-01-02 DIAGNOSIS — Z87891 Personal history of nicotine dependence: Secondary | ICD-10-CM

## 2024-01-02 DIAGNOSIS — F17210 Nicotine dependence, cigarettes, uncomplicated: Secondary | ICD-10-CM

## 2024-01-02 NOTE — Telephone Encounter (Signed)
 Spoke with patient can reviewed lung screening CT results. No suspicious lung nodules seen. Mild emphysema related to smoking history. Coronary calcifications noted. Patient is on Crestor  daily. Also noted was mild ILD findings. Patient denies having difficulty breathing or respiratory symptoms at this time. Pt will discuss with PCP at next visit. Results sent to PCP. Order placed for 12 month CT follow up.

## 2024-01-02 NOTE — Addendum Note (Signed)
 Addended by: ANITRA AQUAS D on: 01/02/2024 09:28 AM   Modules accepted: Orders

## 2024-01-02 NOTE — Telephone Encounter (Signed)
 Attempted to call patient and review results, home phone rings to busy signal. LVM on cell phone.    IMPRESSION: 1. Lung-RADS 1, negative. Continue annual screening with low-dose chest CT without contrast in 12 months. 2. Mild coarsening of the pulmonary markings in the upper and midlung zones can be seen in the setting of smoking related respiratory bronchiolitis interstitial lung disease. 3.  Age advanced three-vessel coronary artery calcification. 4.  Aortic atherosclerosis (ICD10-I70.0). 5.  Emphysema (ICD10-J43.9).

## 2024-01-10 ENCOUNTER — Encounter: Payer: Self-pay | Admitting: Cardiology

## 2024-01-10 DIAGNOSIS — Z1589 Genetic susceptibility to other disease: Secondary | ICD-10-CM

## 2024-01-14 ENCOUNTER — Other Ambulatory Visit: Payer: Self-pay | Admitting: Cardiology

## 2024-01-22 ENCOUNTER — Encounter: Payer: Self-pay | Admitting: Cardiology

## 2024-02-04 ENCOUNTER — Other Ambulatory Visit: Payer: Self-pay | Admitting: Cardiology

## 2024-02-20 ENCOUNTER — Other Ambulatory Visit

## 2024-02-20 DIAGNOSIS — E782 Mixed hyperlipidemia: Secondary | ICD-10-CM

## 2024-02-20 DIAGNOSIS — Z1329 Encounter for screening for other suspected endocrine disorder: Secondary | ICD-10-CM

## 2024-02-20 DIAGNOSIS — R7303 Prediabetes: Secondary | ICD-10-CM

## 2024-02-21 ENCOUNTER — Ambulatory Visit: Payer: Self-pay | Admitting: Cardiology

## 2024-02-21 LAB — CBC WITH DIFFERENTIAL/PLATELET
Basophils Absolute: 0.1 x10E3/uL (ref 0.0–0.2)
Basos: 1 %
EOS (ABSOLUTE): 0.2 x10E3/uL (ref 0.0–0.4)
Eos: 3 %
Hematocrit: 44.2 % (ref 34.0–46.6)
Hemoglobin: 14.3 g/dL (ref 11.1–15.9)
Immature Grans (Abs): 0.1 x10E3/uL (ref 0.0–0.1)
Immature Granulocytes: 1 %
Lymphocytes Absolute: 1.6 x10E3/uL (ref 0.7–3.1)
Lymphs: 19 %
MCH: 32.9 pg (ref 26.6–33.0)
MCHC: 32.4 g/dL (ref 31.5–35.7)
MCV: 102 fL — ABNORMAL HIGH (ref 79–97)
Monocytes Absolute: 0.4 x10E3/uL (ref 0.1–0.9)
Monocytes: 5 %
Neutrophils Absolute: 6.1 x10E3/uL (ref 1.4–7.0)
Neutrophils: 71 %
Platelets: 260 x10E3/uL (ref 150–450)
RBC: 4.35 x10E6/uL (ref 3.77–5.28)
RDW: 13.3 % (ref 11.7–15.4)
WBC: 8.4 x10E3/uL (ref 3.4–10.8)

## 2024-02-21 LAB — CMP14+EGFR
ALT: 9 IU/L (ref 0–32)
AST: 12 IU/L (ref 0–40)
Albumin: 4.3 g/dL (ref 3.8–4.9)
Alkaline Phosphatase: 86 IU/L (ref 49–135)
BUN/Creatinine Ratio: 12 (ref 9–23)
BUN: 16 mg/dL (ref 6–24)
Bilirubin Total: 0.2 mg/dL (ref 0.0–1.2)
CO2: 18 mmol/L — ABNORMAL LOW (ref 20–29)
Calcium: 9.6 mg/dL (ref 8.7–10.2)
Chloride: 106 mmol/L (ref 96–106)
Creatinine, Ser: 1.38 mg/dL — ABNORMAL HIGH (ref 0.57–1.00)
Globulin, Total: 2.7 g/dL (ref 1.5–4.5)
Glucose: 101 mg/dL — ABNORMAL HIGH (ref 70–99)
Potassium: 4 mmol/L (ref 3.5–5.2)
Sodium: 140 mmol/L (ref 134–144)
Total Protein: 7 g/dL (ref 6.0–8.5)
eGFR: 44 mL/min/1.73 — ABNORMAL LOW

## 2024-02-21 LAB — TSH: TSH: 2.81 u[IU]/mL (ref 0.450–4.500)

## 2024-02-21 LAB — LIPID PANEL
Chol/HDL Ratio: 4.1 ratio (ref 0.0–4.4)
Cholesterol, Total: 154 mg/dL (ref 100–199)
HDL: 38 mg/dL — ABNORMAL LOW
LDL Chol Calc (NIH): 76 mg/dL (ref 0–99)
Triglycerides: 242 mg/dL — ABNORMAL HIGH (ref 0–149)
VLDL Cholesterol Cal: 40 mg/dL (ref 5–40)

## 2024-02-21 LAB — HEMOGLOBIN A1C
Est. average glucose Bld gHb Est-mCnc: 114 mg/dL
Hgb A1c MFr Bld: 5.6 % (ref 4.8–5.6)

## 2024-02-22 ENCOUNTER — Ambulatory Visit: Payer: Self-pay | Admitting: Cardiology

## 2024-02-22 ENCOUNTER — Encounter: Payer: Self-pay | Admitting: Cardiology

## 2024-02-22 ENCOUNTER — Ambulatory Visit: Admitting: Cardiology

## 2024-02-22 VITALS — BP 104/68 | HR 97 | Ht 63.0 in | Wt 156.4 lb

## 2024-02-22 DIAGNOSIS — E782 Mixed hyperlipidemia: Secondary | ICD-10-CM | POA: Diagnosis not present

## 2024-02-22 DIAGNOSIS — M5134 Other intervertebral disc degeneration, thoracic region: Secondary | ICD-10-CM

## 2024-02-22 DIAGNOSIS — N182 Chronic kidney disease, stage 2 (mild): Secondary | ICD-10-CM | POA: Diagnosis not present

## 2024-02-22 DIAGNOSIS — M51362 Other intervertebral disc degeneration, lumbar region with discogenic back pain and lower extremity pain: Secondary | ICD-10-CM | POA: Diagnosis not present

## 2024-02-22 DIAGNOSIS — Z0130 Encounter for examination of blood pressure without abnormal findings: Secondary | ICD-10-CM

## 2024-02-22 DIAGNOSIS — R309 Painful micturition, unspecified: Secondary | ICD-10-CM | POA: Diagnosis not present

## 2024-02-22 DIAGNOSIS — R3989 Other symptoms and signs involving the genitourinary system: Secondary | ICD-10-CM | POA: Insufficient documentation

## 2024-02-22 LAB — POCT URINALYSIS DIPSTICK
Bilirubin, UA: NEGATIVE
Blood, UA: NEGATIVE
Glucose, UA: NEGATIVE
Ketones, UA: NEGATIVE
Nitrite, UA: NEGATIVE
Protein, UA: POSITIVE — AB
Spec Grav, UA: 1.025
Urobilinogen, UA: 0.2 U/dL
pH, UA: 5.5

## 2024-02-22 MED ORDER — NITROFURANTOIN MONOHYD MACRO 100 MG PO CAPS: 100.0000 mg | ORAL_CAPSULE | Freq: Two times a day (BID) | ORAL | 0 refills | Status: AC

## 2024-02-22 NOTE — Progress Notes (Signed)
 "  Established Patient Office Visit  Subjective:  Patient ID: Briana [name], female    DOB: 22-Mar-1965  Age: 58 y.o. MRN: [id number]  Chief Complaint  Patient presents with   Follow-up    4 month lab results     Patient in office for 4 month follow up, discuss recent lab results. Patient complaining of pain down left leg, toes numb. Known mild degenerative spurring, recommend PT, patient declines, states she is unable to do PT due to finances. Handout on exercises for back given to patient.  Patient also complaining of bladder pain started about a week ago, worse when urinating. UA today showed some leukocytes. Will send in Macrobid , urine culture.  Discussed recent lab work. Hgb A1c back to normal. Kidney function stable, follows with nephrology. Triglycerides elevated, handout given to patient to help lower triglycerides.  Continue current medications.     No other concerns at this time.   Past Medical History:  Diagnosis Date   (HFpEF) heart failure with preserved ejection fraction (HCC)    a.) TTE 11/08/2017: EF 50%, no RWMAs, norm RVSF, RVSP 22.8, triv MR, mild TR; b.) TEE 02/22/2021:; EF 60-65%, no LAA thrombus, ? PFO with (+) IAS within 3-6 cardiac cycles   Allergy    Anxiety    Aortic atherosclerosis    Arthritis    Asthma    Bipolar disorder (HCC)    CAD (coronary artery disease) 12/21/2022   a.) LDCT 12/21/2022: age advanced  3 vessel CAD   Chronic pain syndrome    a.) on naloxone ; has naloxone  Rx available   CKD (chronic kidney disease), stage III (HCC)    COPD (chronic obstructive pulmonary disease) (HCC)    CVA (cerebral vascular accident) (HCC) 02/18/2021   a.) MRI brain 02/18/2021: tiny acute embolic infarct of the LEFT cerebellar hemisphere and LEFT cerebrum areas   CVA (cerebral vascular accident) (HCC) 02/20/2021   a.) MRI brain 02/20/2021: subcentimeter acute infarct RIGHT anterior cerebellar vermis; new since MRI 2 days prior (02/18/2021)   DDD (degenerative  disc disease), cervical    DDD (degenerative disc disease), lumbar    Depression    Emphysema lung (HCC)    Fibromyalgia    Genetic testing 03/09/2017   Multi-Cancer panel (83 genes) @ [facility] - No pathogenic mutations detected   GERD (gastroesophageal reflux disease)    History of IBS    HLD (hyperlipidemia)    HTN (hypertension)    Hyperthyroidism    Implantable loop recorder present 01/2021   Insomnia    a.) taskes trazodone  PRN   Invasive carcinoma of LEFT breast 2019   a.) clinical stage Ia (ER/PR +, HER2/neu over-expressing) --> Tx'd with surgical resection (lumpectomy) + systemic chemotherapy + XRT + truncated aromotase inhibitor therapy   Lacunar infarction (HCC) 01/29/2019   a.) noted on MRI brain 01/29/2019: 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 (new since 2014 imaging)   Long term current use of opiate analgesic    a.) has naloxone  Rx available   Migraine    Nephrolithiasis    On chronic clopidogrel  therapy    OSA (obstructive sleep apnea)    a.) unable to tolerate mask required for nocturnal PAP therapy   Patent foramen ovale with atrial septal aneurysm 02/22/2021   a.) TEE 02/22/2021: bubble study with (+) IAS observed within 3-6 cardiac cycles   Personal history of chemotherapy    Personal history of radiation therapy    PTSD (post-traumatic stress disorder)  Restless leg syndrome    a.) on ropinirole     Past Surgical History:  Procedure Laterality Date   ABDOMINAL HYSTERECTOMY  2008   BREAST BIOPSY Left 02/02/2017   us  core positive   BREAST LUMPECTOMY Left 02/17/2017   BREAST LUMPECTOMY WITH NEEDLE LOCALIZATION Left 02/17/2017   Procedure: BREAST LUMPECTOMY WITH NEEDLE LOCALIZATION;  Surgeon: Nicholaus Selinda Birmingham, MD;  Location: [facility] ORS;  Service: General;  Laterality: Left;   CARPAL TUNNEL RELEASE Bilateral    COLONOSCOPY N/A 09/04/2023   Procedure: COLONOSCOPY;  Surgeon: Therisa Bi, MD;  Location: [facility] ENDOSCOPY;  Service:  Gastroenterology;  Laterality: N/A;   cryotherapy     DILATION AND CURETTAGE OF UTERUS     ENDOMETRIAL ABLATION     EXTRACORPOREAL SHOCK WAVE LITHOTRIPSY Left 02/23/2023   Procedure: EXTRACORPOREAL SHOCK WAVE LITHOTRIPSY (ESWL);  Surgeon: Penne Knee, MD;  Location: [facility] OR[facility];  Service: Urology;  Laterality: Left;   LAPAROSCOPIC OOPHERECTOMY Left    unsure which side but thinks its the left   LYSIS OF ADHESION Left 10/14/2015   Procedure: LYSIS OF ADHESION;  Surgeon: Franky Cranker, MD;  Location: [facility] OR[facility];  Service: Orthopedics;  Laterality: Left;   NECK SURGERY     lower neck fusion rods and screws   OOPHORECTOMY     one ovary removed    PORTA CATH INSERTION N/A 03/08/2017   Procedure: PORTA CATH INSERTION;  Surgeon: Jama Cordella MATSU, MD;  Location: [facility] INVASIVE CV LAB;  Service: Cardiovascular;  Laterality: N/A;   RESECTION DISTAL CLAVICAL Left 10/14/2015   Procedure: RESECTION DISTAL CLAVICAL;  Surgeon: Franky Cranker, MD;  Location: [facility];  Service: Orthopedics;  Laterality: Left;   SENTINEL NODE BIOPSY Left 02/17/2017   Procedure: SENTINEL NODE BIOPSY;  Surgeon: Nicholaus Selinda Birmingham, MD;  Location: [facility];  Service: General;  Laterality: Left;   SHOULDER ARTHROSCOPY WITH OPEN ROTATOR CUFF REPAIR Left 10/14/2015   Procedure: SHOULDER ARTHROSCOPY WITH OPEN ROTATOR CUFF REPAIR;  Surgeon: Franky Cranker, MD;  Location: [facility];  Service: Orthopedics;  Laterality: Left;   SHOULDER ARTHROSCOPY WITH OPEN ROTATOR CUFF REPAIR AND DISTAL CLAVICLE ACROMINECTOMY Right 09/03/2014   Procedure: RIGHT SHOULDER ARTHROSCOPY WITH MINI OPEN ROTATOR CUFF TEAR;  Surgeon: Franky Cranker, MD;  Location: [facility];  Service: Orthopedics;  Laterality: Right;  biceps tenodesis, arthroscopic subacromial decompression and distal clavicle incision   spinal injections     SUBACROMIAL DECOMPRESSION Left 10/14/2015   Procedure: SUBACROMIAL DECOMPRESSION;  Surgeon: Franky Cranker, MD;  Location: [facility];  Service:  Orthopedics;  Laterality: Left;   TEE WITHOUT CARDIOVERSION N/A 02/22/2021   Procedure: TRANSESOPHAGEAL ECHOCARDIOGRAM (TEE);  Surgeon: Hester Wolm PARAS, MD;  Location: [facility];  Service: Cardiovascular;  Laterality: N/A;    Social History   Socioeconomic History   Marital status: Divorced    Spouse name: Not on file   Number of children: 1   Years of education: Not on file   Highest education level: High school graduate  Occupational History    Comment: disabled  Tobacco Use   Smoking status: Every Day    Current packs/day: 1.00    Average packs/day: 1 pack/day for 52.1 years (52.1 ttl pk-yrs)    Types: Cigarettes    Start date: 1974   Smokeless tobacco: Never  Vaping Use   Vaping status: Never Used  Substance and Sexual Activity   Alcohol use: No    Alcohol/week: 0.0 standard drinks of alcohol   Drug use: Yes    Types: Oxycodone   Comment: as prescribed, in pain management   Sexual activity: Yes  Other Topics Concern   Not on file  Social History Narrative   Lives alone. Best friend is her support person.    Social Drivers of Health   Tobacco Use: High Risk (02/22/2024)   Patient History    Smoking Tobacco Use: Every Day    Smokeless Tobacco Use: Never    Passive Exposure: Not on file  Financial Resource Strain: Low Risk  (07/25/2023)   Received from [facility]   Overall Financial Resource Strain (CARDIA)    Difficulty of Paying Living Expenses: Not hard at all  Food Insecurity: No Food Insecurity (07/25/2023)   Received from [facility]   Epic    Within the past 12 months, you worried that your food would run out before you got the money to buy more.: Never true    Within the past 12 months, the food you bought just didn't last and you didn't have money to get more.: Never true  Transportation Needs: No Transportation Needs (07/25/2023)   Received from [facility] - Transportation    In the past  12 months, has lack of transportation kept you from medical appointments or from getting medications?: No    Lack of Transportation (Non-Medical): No  Physical Activity: Insufficiently Active (12/18/2023)   Exercise Vital Sign    Days of Exercise per Week: 3 days    Minutes of Exercise per Session: 20 min  Stress: Not on file  Social Connections: Not on file  Intimate Partner Violence: Not on file  Depression (PHQ2-9): Medium Risk (12/18/2023)   Depression (PHQ2-9)    PHQ-2 Score: 5  Alcohol Screen: Not on file  Housing: Low Risk  (07/25/2023)   Received from [facility]   Epic    In the last 12 months, was there a time when you were not able to pay the mortgage or rent on time?: No    In the past 12 months, how many times have you moved where you were living?: 0    At any time in the past 12 months, were you homeless or living in a shelter (including now)?: No  Utilities: Not At Risk (07/25/2023)   Received from [facility]   Epic    In the past 12 months has the electric, gas, oil, or water company threatened to shut off services in your home?: No  Health Literacy: Adequate Health Literacy (12/18/2023)   B1300 Health Literacy    Frequency of need for help with medical instructions: Never    Family History  Problem Relation Age of Onset   Breast cancer Mother 44       Glioblastoma also; deceased at 29   Diabetes Father    Lung cancer Father        mesothelioma; deceased 42s   Cancer Maternal Aunt        bone ca; unk. primary   Colon cancer Maternal Grandfather        dx in 77s; deceased 64   Colon cancer Maternal Aunt        dx in 41s; currently 62s   Lung cancer Maternal Grandmother        smoker; deceased 45   Lung cancer Paternal Grandmother        smoker; deceased 48s   Breast cancer Cousin        dx 55s; daughter of mat  aunt with unk. primary cancer   Cancer Other        distant cousin; unknown primary   Colon cancer Other         dx 65s; currently 49; maternal half-sister   Bipolar disorder Sister    Schizophrenia Sister     Allergies[1]  Show/hide medication list[2]  Review of Systems  Constitutional: Negative.   HENT: Negative.    Eyes: Negative.   Respiratory: Negative.  Negative for shortness of breath.   Cardiovascular: Negative.  Negative for chest pain.  Gastrointestinal: Negative.  Negative for abdominal pain, constipation and diarrhea.  Genitourinary: Negative.   Musculoskeletal:  Negative for joint pain and myalgias.  Skin: Negative.   Neurological: Negative.  Negative for dizziness and headaches.  Endo/Heme/Allergies: Negative.   All other systems reviewed and are negative.      Objective:   BP 104/68   Pulse 97   Ht 5' 3 (1.6 m)   Wt 156 lb 6.4 oz (70.9 kg)   LMP 08/24/2008   SpO2 99%   BMI 27.71 kg/m   Vitals:   02/22/24 1256  BP: 104/68  Pulse: 97  Height: 5' 3 (1.6 m)  Weight: 156 lb 6.4 oz (70.9 kg)  SpO2: 99%  BMI (Calculated): 27.71    Physical Exam Vitals and nursing note reviewed.  Constitutional:      Appearance: Normal appearance. She is normal weight.  HENT:     Head: Normocephalic and atraumatic.     Nose: Nose normal.     Mouth/Throat:     Mouth: Mucous membranes are moist.  Eyes:     Extraocular Movements: Extraocular movements intact.     Conjunctiva/sclera: Conjunctivae normal.     Pupils: Pupils are equal, round, and reactive to light.  Cardiovascular:     Rate and Rhythm: Normal rate and regular rhythm.     Pulses: Normal pulses.     Heart sounds: Normal heart sounds.  Pulmonary:     Effort: Pulmonary effort is normal.     Breath sounds: Normal breath sounds.  Abdominal:     General: Abdomen is flat. Bowel sounds are normal.     Palpations: Abdomen is soft.  Musculoskeletal:        General: Normal range of motion.     Cervical back: Normal range of motion.  Skin:    General: Skin is warm and dry.  Neurological:     General: No  focal deficit present.     Mental Status: She is alert and oriented to person, place, and time.  Psychiatric:        Mood and Affect: Mood normal.        Behavior: Behavior normal.        Thought Content: Thought content normal.        Judgment: Judgment normal.      Results for orders placed or performed in visit on 02/22/24  POCT urinalysis dipstick  Result Value Ref Range   Color, UA     Clarity, UA     Glucose, UA Negative Negative   Bilirubin, UA negative    Ketones, UA negative    Spec Grav, UA 1.025 1.010 - 1.025   Blood, UA negative    pH, UA 5.5 5.0 - 8.0   Protein, UA Positive (A) Negative   Urobilinogen, UA 0.2 0.2 or 1.0 E.U./dL   Nitrite, UA negative    Leukocytes, UA Trace (A) Negative   Appearance     Odor  Recent Results (from the past 2160 hours)  HLA-B27 Antigen     Status: None   Collection Time: 12/18/23  1:33 PM  Result Value Ref Range   HLA B27 Positive     Comment: HLA-B*27 Positive This patient is positive for an HLA-B*27 allele that is associated with spondyloarthropathies. This procedure rules out the B*27:06 and 27:09 alleles, which the literature suggests are not associated with spondyloarthropathies. There are several very rare HLA alleles that are not ruled out by this assay that may result in a false positive. Clinical correlation is recommended. B27 allele interpretation for all loci based on IMGT/HLA database version 3.58 This test was developed and its performance characteristics determined by [facility].  It has not been cleared or approved by the Food and Drug Administration. The FDA has determined that such clearance or approval is not necessary. [facility] CLIA ID Number [id number] HISTOCOMPATIBILITY SECTION DIRECTOR: Camellia Griffon, PhD, D(ABMLI), F(ACHI) This test was performed using Polymerase Chain Reaction (PCR) and Sequence Specific Oligonucleotide Probes (SSOP) technique. Sequence Based Typing (SBT) may be used as  a  supplemental method when necessary. If you have questions, please call [facility] customer service at [phone] or email at [email or web address].   CBC with Differential/Platelet     Status: Abnormal   Collection Time: 02/20/24 11:27 AM  Result Value Ref Range   WBC 8.4 3.4 - 10.8 x10E3/uL   RBC 4.35 3.77 - 5.28 x10E6/uL   Hemoglobin 14.3 11.1 - 15.9 g/dL   Hematocrit 55.7 65.9 - 46.6 %   MCV 102 (H) 79 - 97 fL   MCH 32.9 26.6 - 33.0 pg   MCHC 32.4 31.5 - 35.7 g/dL   RDW 86.6 88.2 - 84.5 %   Platelets 260 150 - 450 x10E3/uL   Neutrophils 71 Not Estab. %   Lymphs 19 Not Estab. %   Monocytes 5 Not Estab. %   Eos 3 Not Estab. %   Basos 1 Not Estab. %   Neutrophils Absolute 6.1 1.4 - 7.0 x10E3/uL   Lymphocytes Absolute 1.6 0.7 - 3.1 x10E3/uL   Monocytes Absolute 0.4 0.1 - 0.9 x10E3/uL   EOS (ABSOLUTE) 0.2 0.0 - 0.4 x10E3/uL   Basophils Absolute 0.1 0.0 - 0.2 x10E3/uL   Immature Granulocytes 1 Not Estab. %   Immature Grans (Abs) 0.1 0.0 - 0.1 x10E3/uL  CMP14+EGFR     Status: Abnormal   Collection Time: 02/20/24 11:27 AM  Result Value Ref Range   Glucose 101 (H) 70 - 99 mg/dL   BUN 16 6 - 24 mg/dL   Creatinine, Ser 8.61 (H) 0.57 - 1.00 mg/dL   eGFR 44 (L) >40 fO/fpw/8.26   BUN/Creatinine Ratio 12 9 - 23   Sodium 140 134 - 144 mmol/L   Potassium 4.0 3.5 - 5.2 mmol/L   Chloride 106 96 - 106 mmol/L   CO2 18 (L) 20 - 29 mmol/L   Calcium  9.6 8.7 - 10.2 mg/dL   Total Protein 7.0 6.0 - 8.5 g/dL   Albumin 4.3 3.8 - 4.9 g/dL   Globulin, Total 2.7 1.5 - 4.5 g/dL   Bilirubin Total 0.2 0.0 - 1.2 mg/dL   Alkaline Phosphatase 86 49 - 135 IU/L   AST 12 0 - 40 IU/L   ALT 9 0 - 32 IU/L  Hemoglobin A1c     Status: None   Collection Time: 02/20/24 11:27 AM  Result Value Ref Range   Hgb A1c MFr Bld 5.6 4.8 - 5.6 %    Comment:  Prediabetes: 5.7 - 6.4          Diabetes: >6.4          Glycemic control for adults with diabetes: <7.0    Est. average glucose Bld gHb Est-mCnc 114 mg/dL   Lipid panel     Status: Abnormal   Collection Time: 02/20/24 11:27 AM  Result Value Ref Range   Cholesterol, Total 154 100 - 199 mg/dL   Triglycerides 757 (H) 0 - 149 mg/dL   HDL 38 (L) >60 mg/dL   VLDL Cholesterol Cal 40 5 - 40 mg/dL   LDL Chol Calc (NIH) 76 0 - 99 mg/dL   Chol/HDL Ratio 4.1 0.0 - 4.4 ratio    Comment:                                   T. Chol/HDL Ratio                                             Men  Women                               1/2 Avg.Risk  3.4    3.3                                   Avg.Risk  5.0    4.4                                2X Avg.Risk  9.6    7.1                                3X Avg.Risk 23.4   11.0   TSH     Status: None   Collection Time: 02/20/24 11:27 AM  Result Value Ref Range   TSH 2.810 0.450 - 4.500 uIU/mL  POCT urinalysis dipstick     Status: Abnormal   Collection Time: 02/22/24  1:20 PM  Result Value Ref Range   Color, UA     Clarity, UA     Glucose, UA Negative Negative   Bilirubin, UA negative    Ketones, UA negative    Spec Grav, UA 1.025 1.010 - 1.025   Blood, UA negative    pH, UA 5.5 5.0 - 8.0   Protein, UA Positive (A) Negative   Urobilinogen, UA 0.2 0.2 or 1.0 E.U./dL   Nitrite, UA negative    Leukocytes, UA Trace (A) Negative   Appearance     Odor        Assessment & Plan:  Back exercises Urine culture Macrobid  Work on diet to lower triglycerides  Problem List Items Addressed This Visit       Musculoskeletal and Integument   DDD (degenerative disc disease), thoracic   DDD (degenerative disc disease), lumbar     Genitourinary   Chronic kidney disease, stage II (mild) - Primary   Relevant Orders   POCT urinalysis dipstick (Completed)   Urine Culture     Other   HLD (hyperlipidemia)   Bladder pain  Relevant Orders   Urine Culture    Return in about 4 months (around 06/21/2024) for fasting lab work prior.   Total time spent: 25 minutes. This time includes review of previous notes and  results and patient face to face interaction during today's visit.    Jeoffrey Pollen, NP  02/22/2024   This document may have been prepared by [facility] Voice Recognition software and as such may include unintentional dictation errors.     [1]  Allergies Allergen Reactions   Iodine    Bee Venom Hives and Rash   Cefoxitin Rash   Cefuroxime Axetil Hives and Rash   Cucumber Extract Rash   Gadolinium Derivatives Swelling, Rash and Other (See Comments)    NECK BECAME RED AND TONGUE WAS SWELLING SLIGHTLY PER PT   Iodinated Contrast Media Hives, Rash and Other (See Comments)   Latex Rash   Other Hives and Rash    Bee Stings-swelling/hives/rash Wool (textile fiber)-Rash/itching   Tomato Rash and Other (See Comments)    Red tomatoes  [2]  Outpatient Medications Prior to Visit  Medication Sig   albuterol  (VENTOLIN  HFA) 108 (90 Base) MCG/ACT inhaler USE 1 TO 2 INHALATIONS BY MOUTH  EVERY 6 HOURS AS NEEDED FOR  WHEEZING OR SHORTNESS OF BREATH   B Complex Vitamins (VITAMIN B COMPLEX) CAPS Take by mouth.   baclofen  (LIORESAL ) 10 MG tablet Take 10 mg by mouth 2 (two) times daily.   Beta Carotene (VITAMIN A) 25000 UNIT capsule Take 25,000 Units by mouth daily.   calcium  carbonate (OS-CAL) 600 MG TABS tablet Take by mouth.   carbamide peroxide (DEBROX) 6.5 % OTIC solution Place 5 drops into the left ear 2 (two) times daily.   cholecalciferol  (VITAMIN D ) 1000 UNITS tablet Take 1,000 Units by mouth 2 (two) times daily.    clopidogrel  (PLAVIX ) 75 MG tablet TAKE 1 TABLET BY MOUTH DAILY   furosemide  (LASIX ) 20 MG tablet Take 1 tablet (20 mg total) by mouth daily as needed.   Multiple Vitamins-Minerals (HAIR SKIN AND NAILS FORMULA) TABS Take 2 tablets by mouth daily.   OXYCODONE  HCL PO Take 15 mg by mouth as needed. Take 4 times a day as needed.   pregabalin  (LYRICA ) 25 MG capsule Take 1 capsule by mouth every other day.   promethazine  (PHENERGAN ) 25 MG tablet TAKE 1 TABLET BY MOUTH EVERY 6  HOURS AS  NEEDED FOR NAUSEA OR  VOMITING   rOPINIRole  (REQUIP ) 2 MG tablet TAKE 1 TABLET BY MOUTH AT  BEDTIME   rosuvastatin  (CRESTOR ) 40 MG tablet TAKE 1 TABLET BY MOUTH AT  BEDTIME   spironolactone  (ALDACTONE ) 100 MG tablet Take 1 tablet (100 mg total) by mouth daily.   topiramate  (TOPAMAX ) 100 MG tablet Take 200 mg by mouth daily.    trazodone  (DESYREL ) 300 MG tablet TAKE 1 TABLET BY MOUTH AT  BEDTIME   vitamin E  200 UNIT capsule Take 200 Units by mouth daily.   vitamin k 100 MCG tablet Take 100 mcg by mouth daily.   benzoyl peroxide -erythromycin  (BENZAMYCIN) gel Apply to affected area 2 times daily (Patient not taking: Reported on 02/22/2024)   EPINEPHrine  0.3 mg/0.3 mL IJ SOAJ injection INJECT 1 PEN INTRAMUSCULARLY AS  NEEDED FOR ALLERGIC RESPONSE AS  DIRECTED BY MD. [facility]  HELP AFTER USE (Patient not taking: Reported on 02/22/2024)   NARCAN  4 MG/0.1ML LIQD nasal spray kit  (Patient not taking: Reported on 02/22/2024)   rizatriptan (MAXALT-MLT) 10 MG disintegrating tablet Take 10 mg by  mouth as needed for migraine. May repeat in 2 hours if needed (Patient not taking: Reported on 02/22/2024)   No facility-administered medications prior to visit.   "

## 2024-02-23 LAB — URINE CULTURE

## 2024-02-27 ENCOUNTER — Encounter: Payer: Self-pay | Admitting: Cardiology

## 2024-02-28 ENCOUNTER — Other Ambulatory Visit: Payer: Self-pay | Admitting: Cardiology

## 2024-02-28 DIAGNOSIS — Z0184 Encounter for antibody response examination: Secondary | ICD-10-CM

## 2024-06-25 ENCOUNTER — Ambulatory Visit: Admitting: Internal Medicine

## 2024-11-05 ENCOUNTER — Ambulatory Visit: Admitting: Nurse Practitioner
